# Patient Record
Sex: Female | Born: 1940 | Race: White | Hispanic: No | Smoking: Never smoker
Health system: Southern US, Community
[De-identification: ages and names within clinical notes are randomized; demographics above are authoritative.]

## PROBLEM LIST (undated history)

## (undated) DIAGNOSIS — R945 Abnormal results of liver function studies: Secondary | ICD-10-CM

## (undated) DIAGNOSIS — E782 Mixed hyperlipidemia: Secondary | ICD-10-CM

## (undated) DIAGNOSIS — J441 Chronic obstructive pulmonary disease with (acute) exacerbation: Secondary | ICD-10-CM

## (undated) DIAGNOSIS — Z8719 Personal history of other diseases of the digestive system: Secondary | ICD-10-CM

## (undated) DIAGNOSIS — C4491 Basal cell carcinoma of skin, unspecified: Secondary | ICD-10-CM

## (undated) DIAGNOSIS — C801 Malignant (primary) neoplasm, unspecified: Secondary | ICD-10-CM

## (undated) DIAGNOSIS — R7989 Other specified abnormal findings of blood chemistry: Secondary | ICD-10-CM

## (undated) DIAGNOSIS — IMO0002 Reserved for concepts with insufficient information to code with codable children: Secondary | ICD-10-CM

## (undated) DIAGNOSIS — E1165 Type 2 diabetes mellitus with hyperglycemia: Secondary | ICD-10-CM

## (undated) DIAGNOSIS — I10 Essential (primary) hypertension: Secondary | ICD-10-CM

## (undated) DIAGNOSIS — E11649 Type 2 diabetes mellitus with hypoglycemia without coma: Secondary | ICD-10-CM

## (undated) DIAGNOSIS — D329 Benign neoplasm of meninges, unspecified: Secondary | ICD-10-CM

## (undated) DIAGNOSIS — F32A Depression, unspecified: Secondary | ICD-10-CM

## (undated) DIAGNOSIS — E1140 Type 2 diabetes mellitus with diabetic neuropathy, unspecified: Secondary | ICD-10-CM

## (undated) DIAGNOSIS — E669 Obesity, unspecified: Secondary | ICD-10-CM

## (undated) DIAGNOSIS — T148XXA Other injury of unspecified body region, initial encounter: Secondary | ICD-10-CM

## (undated) DIAGNOSIS — N289 Disorder of kidney and ureter, unspecified: Secondary | ICD-10-CM

## (undated) DIAGNOSIS — Z9889 Other specified postprocedural states: Secondary | ICD-10-CM

## (undated) DIAGNOSIS — S4291XA Fracture of right shoulder girdle, part unspecified, initial encounter for closed fracture: Secondary | ICD-10-CM

## (undated) DIAGNOSIS — G4733 Obstructive sleep apnea (adult) (pediatric): Secondary | ICD-10-CM

## (undated) DIAGNOSIS — E063 Autoimmune thyroiditis: Secondary | ICD-10-CM

## (undated) DIAGNOSIS — F329 Major depressive disorder, single episode, unspecified: Secondary | ICD-10-CM

## (undated) DIAGNOSIS — E1151 Type 2 diabetes mellitus with diabetic peripheral angiopathy without gangrene: Secondary | ICD-10-CM

## (undated) DIAGNOSIS — R42 Dizziness and giddiness: Secondary | ICD-10-CM

## (undated) DIAGNOSIS — R569 Unspecified convulsions: Secondary | ICD-10-CM

## (undated) DIAGNOSIS — E049 Nontoxic goiter, unspecified: Secondary | ICD-10-CM

## (undated) HISTORY — DX: Major depressive disorder, single episode, unspecified: F32.9

## (undated) HISTORY — DX: Autoimmune thyroiditis: E06.3

## (undated) HISTORY — DX: Obstructive sleep apnea (adult) (pediatric): G47.33

## (undated) HISTORY — DX: Mixed hyperlipidemia: E78.2

## (undated) HISTORY — DX: Fracture of right shoulder girdle, part unspecified, initial encounter for closed fracture: S42.91XA

## (undated) HISTORY — DX: Nontoxic goiter, unspecified: E04.9

## (undated) HISTORY — DX: Chronic obstructive pulmonary disease with (acute) exacerbation: J44.1

## (undated) HISTORY — DX: Personal history of other diseases of the digestive system: Z87.19

## (undated) HISTORY — DX: Essential (primary) hypertension: I10

## (undated) HISTORY — DX: Reserved for concepts with insufficient information to code with codable children: IMO0002

## (undated) HISTORY — DX: Other injury of unspecified body region, initial encounter: T14.8XXA

## (undated) HISTORY — DX: Type 2 diabetes mellitus with diabetic peripheral angiopathy without gangrene: E11.51

## (undated) HISTORY — PX: BASAL CELL CARCINOMA EXCISION: SHX1214

## (undated) HISTORY — DX: Basal cell carcinoma of skin, unspecified: C44.91

## (undated) HISTORY — PX: LAPAROSCOPIC GASTRIC BANDING: SHX1100

## (undated) HISTORY — DX: Unspecified convulsions: R56.9

## (undated) HISTORY — PX: BACK SURGERY: SHX140

## (undated) HISTORY — PX: APPENDECTOMY: SHX54

## (undated) HISTORY — DX: Dizziness and giddiness: R42

## (undated) HISTORY — DX: Obesity, unspecified: E66.9

## (undated) HISTORY — DX: Depression, unspecified: F32.A

## (undated) HISTORY — PX: CHOLECYSTECTOMY: SHX55

## (undated) HISTORY — DX: Type 2 diabetes mellitus with hyperglycemia: E11.65

## (undated) HISTORY — DX: Type 2 diabetes mellitus with diabetic neuropathy, unspecified: E11.40

## (undated) HISTORY — DX: Abnormal results of liver function studies: R94.5

## (undated) HISTORY — DX: Other specified abnormal findings of blood chemistry: R79.89

## (undated) HISTORY — DX: Benign neoplasm of meninges, unspecified: D32.9

## (undated) HISTORY — PX: MASTECTOMY: SHX3

## (undated) HISTORY — DX: Disorder of kidney and ureter, unspecified: N28.9

## (undated) HISTORY — DX: Type 2 diabetes mellitus with hypoglycemia without coma: E11.649

---

## 1998-02-04 ENCOUNTER — Ambulatory Visit (HOSPITAL_COMMUNITY): Admission: RE | Admit: 1998-02-04 | Discharge: 1998-02-04 | Payer: Self-pay | Admitting: Family Medicine

## 1998-07-23 ENCOUNTER — Encounter: Payer: Self-pay | Admitting: Neurosurgery

## 1998-07-27 ENCOUNTER — Inpatient Hospital Stay (HOSPITAL_COMMUNITY): Admission: RE | Admit: 1998-07-27 | Discharge: 1998-07-29 | Payer: Self-pay | Admitting: Neurosurgery

## 1998-07-27 ENCOUNTER — Encounter: Payer: Self-pay | Admitting: Neurosurgery

## 1999-02-10 ENCOUNTER — Encounter: Payer: Self-pay | Admitting: Family Medicine

## 1999-02-10 ENCOUNTER — Ambulatory Visit (HOSPITAL_COMMUNITY): Admission: RE | Admit: 1999-02-10 | Discharge: 1999-02-10 | Payer: Self-pay | Admitting: Family Medicine

## 2000-02-13 ENCOUNTER — Ambulatory Visit (HOSPITAL_COMMUNITY): Admission: RE | Admit: 2000-02-13 | Discharge: 2000-02-13 | Payer: Self-pay | Admitting: Family Medicine

## 2000-02-13 ENCOUNTER — Encounter: Payer: Self-pay | Admitting: Family Medicine

## 2001-03-04 ENCOUNTER — Encounter: Payer: Self-pay | Admitting: Family Medicine

## 2001-03-04 ENCOUNTER — Ambulatory Visit (HOSPITAL_COMMUNITY): Admission: RE | Admit: 2001-03-04 | Discharge: 2001-03-04 | Payer: Self-pay | Admitting: Family Medicine

## 2001-04-08 ENCOUNTER — Encounter: Payer: Self-pay | Admitting: Orthopedic Surgery

## 2001-04-08 ENCOUNTER — Encounter: Admission: RE | Admit: 2001-04-08 | Discharge: 2001-04-08 | Payer: Self-pay | Admitting: Orthopedic Surgery

## 2003-06-28 ENCOUNTER — Emergency Department (HOSPITAL_COMMUNITY): Admission: EM | Admit: 2003-06-28 | Discharge: 2003-06-28 | Payer: Self-pay | Admitting: Emergency Medicine

## 2003-07-02 ENCOUNTER — Encounter: Admission: RE | Admit: 2003-07-02 | Discharge: 2003-07-02 | Payer: Self-pay | Admitting: Neurosurgery

## 2003-07-17 ENCOUNTER — Encounter: Admission: RE | Admit: 2003-07-17 | Discharge: 2003-07-17 | Payer: Self-pay | Admitting: Neurosurgery

## 2003-07-28 ENCOUNTER — Encounter: Admission: RE | Admit: 2003-07-28 | Discharge: 2003-07-28 | Payer: Self-pay | Admitting: Neurosurgery

## 2003-11-18 ENCOUNTER — Inpatient Hospital Stay (HOSPITAL_COMMUNITY): Admission: RE | Admit: 2003-11-18 | Discharge: 2003-11-20 | Payer: Self-pay | Admitting: Neurosurgery

## 2005-08-31 ENCOUNTER — Ambulatory Visit: Payer: Self-pay | Admitting: "Endocrinology

## 2005-10-02 ENCOUNTER — Ambulatory Visit: Payer: Self-pay | Admitting: "Endocrinology

## 2005-12-14 ENCOUNTER — Encounter: Admission: RE | Admit: 2005-12-14 | Discharge: 2006-03-14 | Payer: Self-pay | Admitting: "Endocrinology

## 2006-01-31 ENCOUNTER — Encounter: Admission: RE | Admit: 2006-01-31 | Discharge: 2006-05-01 | Payer: Self-pay | Admitting: "Endocrinology

## 2006-02-15 ENCOUNTER — Ambulatory Visit: Payer: Self-pay | Admitting: "Endocrinology

## 2006-04-11 ENCOUNTER — Ambulatory Visit: Payer: Self-pay | Admitting: "Endocrinology

## 2006-07-11 ENCOUNTER — Ambulatory Visit: Payer: Self-pay | Admitting: "Endocrinology

## 2006-08-07 ENCOUNTER — Inpatient Hospital Stay (HOSPITAL_COMMUNITY): Admission: RE | Admit: 2006-08-07 | Discharge: 2006-08-08 | Payer: Self-pay | Admitting: Neurosurgery

## 2006-10-25 ENCOUNTER — Ambulatory Visit: Payer: Self-pay | Admitting: "Endocrinology

## 2007-01-22 ENCOUNTER — Ambulatory Visit: Payer: Self-pay | Admitting: "Endocrinology

## 2007-05-13 ENCOUNTER — Ambulatory Visit: Payer: Self-pay | Admitting: "Endocrinology

## 2007-08-27 ENCOUNTER — Ambulatory Visit: Payer: Self-pay | Admitting: "Endocrinology

## 2008-02-21 ENCOUNTER — Ambulatory Visit (HOSPITAL_COMMUNITY): Admission: RE | Admit: 2008-02-21 | Discharge: 2008-02-21 | Payer: Self-pay | Admitting: Surgery

## 2008-03-18 ENCOUNTER — Encounter: Admission: RE | Admit: 2008-03-18 | Discharge: 2008-03-18 | Payer: Self-pay | Admitting: Surgery

## 2008-04-07 ENCOUNTER — Ambulatory Visit: Payer: Self-pay | Admitting: "Endocrinology

## 2008-05-11 ENCOUNTER — Ambulatory Visit (HOSPITAL_COMMUNITY): Admission: RE | Admit: 2008-05-11 | Discharge: 2008-05-11 | Payer: Self-pay | Admitting: Surgery

## 2008-07-16 ENCOUNTER — Ambulatory Visit: Payer: Self-pay | Admitting: "Endocrinology

## 2008-11-05 ENCOUNTER — Encounter: Admission: RE | Admit: 2008-11-05 | Discharge: 2009-02-03 | Payer: Self-pay | Admitting: Surgery

## 2008-11-23 ENCOUNTER — Ambulatory Visit (HOSPITAL_COMMUNITY): Admission: RE | Admit: 2008-11-23 | Discharge: 2008-11-24 | Payer: Self-pay | Admitting: Surgery

## 2008-12-01 ENCOUNTER — Inpatient Hospital Stay (HOSPITAL_COMMUNITY): Admission: EM | Admit: 2008-12-01 | Discharge: 2008-12-03 | Payer: Self-pay | Admitting: Emergency Medicine

## 2008-12-05 ENCOUNTER — Inpatient Hospital Stay (HOSPITAL_COMMUNITY): Admission: EM | Admit: 2008-12-05 | Discharge: 2008-12-08 | Payer: Self-pay | Admitting: Emergency Medicine

## 2008-12-15 ENCOUNTER — Ambulatory Visit: Payer: Self-pay | Admitting: "Endocrinology

## 2009-01-12 ENCOUNTER — Ambulatory Visit: Payer: Self-pay | Admitting: "Endocrinology

## 2009-02-16 ENCOUNTER — Encounter: Admission: RE | Admit: 2009-02-16 | Discharge: 2009-04-07 | Payer: Self-pay | Admitting: Surgery

## 2009-03-11 ENCOUNTER — Encounter: Admission: RE | Admit: 2009-03-11 | Discharge: 2009-03-11 | Payer: Self-pay | Admitting: Surgery

## 2009-03-16 ENCOUNTER — Ambulatory Visit (HOSPITAL_COMMUNITY): Admission: RE | Admit: 2009-03-16 | Discharge: 2009-03-16 | Payer: Self-pay | Admitting: Surgery

## 2009-03-17 ENCOUNTER — Encounter: Admission: RE | Admit: 2009-03-17 | Discharge: 2009-03-17 | Payer: Self-pay | Admitting: Surgery

## 2009-03-30 ENCOUNTER — Encounter: Payer: Self-pay | Admitting: Surgery

## 2009-03-30 ENCOUNTER — Inpatient Hospital Stay (HOSPITAL_COMMUNITY): Admission: RE | Admit: 2009-03-30 | Discharge: 2009-04-01 | Payer: Self-pay | Admitting: Surgery

## 2009-03-30 ENCOUNTER — Encounter (INDEPENDENT_AMBULATORY_CARE_PROVIDER_SITE_OTHER): Payer: Self-pay | Admitting: Surgery

## 2009-04-22 ENCOUNTER — Inpatient Hospital Stay (HOSPITAL_COMMUNITY): Admission: EM | Admit: 2009-04-22 | Discharge: 2009-04-28 | Payer: Self-pay | Admitting: Emergency Medicine

## 2009-05-02 ENCOUNTER — Inpatient Hospital Stay (HOSPITAL_COMMUNITY): Admission: EM | Admit: 2009-05-02 | Discharge: 2009-05-17 | Payer: Self-pay | Admitting: Emergency Medicine

## 2009-06-23 ENCOUNTER — Ambulatory Visit: Payer: Self-pay | Admitting: Oncology

## 2009-07-26 ENCOUNTER — Ambulatory Visit: Payer: Self-pay | Admitting: "Endocrinology

## 2009-08-24 ENCOUNTER — Ambulatory Visit: Payer: Self-pay | Admitting: Vascular Surgery

## 2010-02-21 ENCOUNTER — Ambulatory Visit: Payer: Self-pay | Admitting: "Endocrinology

## 2010-05-01 ENCOUNTER — Encounter: Payer: Self-pay | Admitting: Surgery

## 2010-05-25 ENCOUNTER — Ambulatory Visit (INDEPENDENT_AMBULATORY_CARE_PROVIDER_SITE_OTHER): Payer: Medicare Other | Admitting: "Endocrinology

## 2010-05-25 DIAGNOSIS — I10 Essential (primary) hypertension: Secondary | ICD-10-CM

## 2010-05-25 DIAGNOSIS — E1065 Type 1 diabetes mellitus with hyperglycemia: Secondary | ICD-10-CM

## 2010-05-25 DIAGNOSIS — E038 Other specified hypothyroidism: Secondary | ICD-10-CM

## 2010-05-25 DIAGNOSIS — IMO0002 Reserved for concepts with insufficient information to code with codable children: Secondary | ICD-10-CM

## 2010-06-26 LAB — GLUCOSE, CAPILLARY
Glucose-Capillary: 106 mg/dL — ABNORMAL HIGH (ref 70–99)
Glucose-Capillary: 158 mg/dL — ABNORMAL HIGH (ref 70–99)
Glucose-Capillary: 162 mg/dL — ABNORMAL HIGH (ref 70–99)
Glucose-Capillary: 165 mg/dL — ABNORMAL HIGH (ref 70–99)
Glucose-Capillary: 200 mg/dL — ABNORMAL HIGH (ref 70–99)
Glucose-Capillary: 315 mg/dL — ABNORMAL HIGH (ref 70–99)
Glucose-Capillary: 360 mg/dL — ABNORMAL HIGH (ref 70–99)
Glucose-Capillary: 101 mg/dL — ABNORMAL HIGH (ref 70–99)
Glucose-Capillary: 105 mg/dL — ABNORMAL HIGH (ref 70–99)
Glucose-Capillary: 107 mg/dL — ABNORMAL HIGH (ref 70–99)
Glucose-Capillary: 110 mg/dL — ABNORMAL HIGH (ref 70–99)
Glucose-Capillary: 116 mg/dL — ABNORMAL HIGH (ref 70–99)
Glucose-Capillary: 118 mg/dL — ABNORMAL HIGH (ref 70–99)
Glucose-Capillary: 119 mg/dL — ABNORMAL HIGH (ref 70–99)
Glucose-Capillary: 123 mg/dL — ABNORMAL HIGH (ref 70–99)
Glucose-Capillary: 144 mg/dL — ABNORMAL HIGH (ref 70–99)
Glucose-Capillary: 146 mg/dL — ABNORMAL HIGH (ref 70–99)
Glucose-Capillary: 148 mg/dL — ABNORMAL HIGH (ref 70–99)
Glucose-Capillary: 150 mg/dL — ABNORMAL HIGH (ref 70–99)
Glucose-Capillary: 154 mg/dL — ABNORMAL HIGH (ref 70–99)
Glucose-Capillary: 156 mg/dL — ABNORMAL HIGH (ref 70–99)
Glucose-Capillary: 179 mg/dL — ABNORMAL HIGH (ref 70–99)
Glucose-Capillary: 194 mg/dL — ABNORMAL HIGH (ref 70–99)
Glucose-Capillary: 207 mg/dL — ABNORMAL HIGH (ref 70–99)
Glucose-Capillary: 209 mg/dL — ABNORMAL HIGH (ref 70–99)
Glucose-Capillary: 210 mg/dL — ABNORMAL HIGH (ref 70–99)
Glucose-Capillary: 211 mg/dL — ABNORMAL HIGH (ref 70–99)
Glucose-Capillary: 214 mg/dL — ABNORMAL HIGH (ref 70–99)
Glucose-Capillary: 215 mg/dL — ABNORMAL HIGH (ref 70–99)
Glucose-Capillary: 228 mg/dL — ABNORMAL HIGH (ref 70–99)
Glucose-Capillary: 229 mg/dL — ABNORMAL HIGH (ref 70–99)
Glucose-Capillary: 233 mg/dL — ABNORMAL HIGH (ref 70–99)
Glucose-Capillary: 238 mg/dL — ABNORMAL HIGH (ref 70–99)
Glucose-Capillary: 239 mg/dL — ABNORMAL HIGH (ref 70–99)
Glucose-Capillary: 273 mg/dL — ABNORMAL HIGH (ref 70–99)
Glucose-Capillary: 290 mg/dL — ABNORMAL HIGH (ref 70–99)
Glucose-Capillary: 309 mg/dL — ABNORMAL HIGH (ref 70–99)
Glucose-Capillary: 323 mg/dL — ABNORMAL HIGH (ref 70–99)
Glucose-Capillary: 75 mg/dL (ref 70–99)
Glucose-Capillary: 76 mg/dL (ref 70–99)
Glucose-Capillary: 79 mg/dL (ref 70–99)
Glucose-Capillary: 84 mg/dL (ref 70–99)
Glucose-Capillary: 89 mg/dL (ref 70–99)

## 2010-06-26 LAB — BLOOD GAS, ARTERIAL
Acid-Base Excess: 2.2 mmol/L — ABNORMAL HIGH (ref 0.0–2.0)
Drawn by: 235321
Drawn by: 235321
FIO2: 0.21 %
O2 Content: 2 L/min
pCO2 arterial: 17.5 mmHg — CL (ref 35.0–45.0)
pCO2 arterial: 41.7 mmHg (ref 35.0–45.0)
pH, Arterial: 7.684 (ref 7.350–7.400)
pO2, Arterial: 139 mmHg — ABNORMAL HIGH (ref 80.0–100.0)
pO2, Arterial: 62 mmHg — ABNORMAL LOW (ref 80.0–100.0)

## 2010-06-26 LAB — CBC
HCT: 27.7 % — ABNORMAL LOW (ref 36.0–46.0)
Hemoglobin: 12.3 g/dL (ref 12.0–15.0)
Hemoglobin: 9.4 g/dL — ABNORMAL LOW (ref 12.0–15.0)
Hemoglobin: 10.2 g/dL — ABNORMAL LOW (ref 12.0–15.0)
Hemoglobin: 10.2 g/dL — ABNORMAL LOW (ref 12.0–15.0)
MCHC: 33.8 g/dL (ref 30.0–36.0)
MCHC: 33.8 g/dL (ref 30.0–36.0)
MCHC: 33.9 g/dL (ref 30.0–36.0)
MCV: 88.9 fL (ref 78.0–100.0)
MCV: 89.5 fL (ref 78.0–100.0)
Platelets: 231 10*3/uL (ref 150–400)
Platelets: 205 10*3/uL (ref 150–400)
Platelets: 227 10*3/uL (ref 150–400)
Platelets: 242 10*3/uL (ref 150–400)
Platelets: 283 10*3/uL (ref 150–400)
RBC: 3.09 MIL/uL — ABNORMAL LOW (ref 3.87–5.11)
RBC: 4.03 MIL/uL (ref 3.87–5.11)
RBC: 3.21 MIL/uL — ABNORMAL LOW (ref 3.87–5.11)
RBC: 3.37 MIL/uL — ABNORMAL LOW (ref 3.87–5.11)
RBC: 3.4 MIL/uL — ABNORMAL LOW (ref 3.87–5.11)
RDW: 13.8 % (ref 11.5–15.5)
RDW: 13.8 % (ref 11.5–15.5)
RDW: 14.3 % (ref 11.5–15.5)
RDW: 14.6 % (ref 11.5–15.5)
RDW: 14.9 % (ref 11.5–15.5)
WBC: 9.6 10*3/uL (ref 4.0–10.5)
WBC: 10.1 10*3/uL (ref 4.0–10.5)
WBC: 6.2 10*3/uL (ref 4.0–10.5)
WBC: 6.4 10*3/uL (ref 4.0–10.5)
WBC: 7.6 10*3/uL (ref 4.0–10.5)

## 2010-06-26 LAB — BASIC METABOLIC PANEL
BUN: 22 mg/dL (ref 6–23)
BUN: 21 mg/dL (ref 6–23)
CO2: 21 mEq/L (ref 19–32)
CO2: 24 mEq/L (ref 19–32)
CO2: 24 mEq/L (ref 19–32)
CO2: 32 mEq/L (ref 19–32)
Calcium: 8.6 mg/dL (ref 8.4–10.5)
Calcium: 9 mg/dL (ref 8.4–10.5)
Calcium: 8 mg/dL — ABNORMAL LOW (ref 8.4–10.5)
Calcium: 8.2 mg/dL — ABNORMAL LOW (ref 8.4–10.5)
Chloride: 106 mEq/L (ref 96–112)
Chloride: 107 mEq/L (ref 96–112)
Chloride: 100 mEq/L (ref 96–112)
Chloride: 106 mEq/L (ref 96–112)
Chloride: 106 mEq/L (ref 96–112)
Chloride: 99 mEq/L (ref 96–112)
Creatinine, Ser: 0.88 mg/dL (ref 0.4–1.2)
Creatinine, Ser: 0.72 mg/dL (ref 0.4–1.2)
Creatinine, Ser: 0.74 mg/dL (ref 0.4–1.2)
Creatinine, Ser: 0.81 mg/dL (ref 0.4–1.2)
Creatinine, Ser: 0.9 mg/dL (ref 0.4–1.2)
Creatinine, Ser: 1.01 mg/dL (ref 0.4–1.2)
GFR calc Af Amer: >60 mL/min (ref >60)
GFR calc Af Amer: >60 mL/min (ref >60)
GFR calc Af Amer: >60 mL/min (ref >60)
GFR calc Af Amer: >60 mL/min (ref >60)
GFR calc Af Amer: >60 mL/min (ref >60)
GFR calc Af Amer: >60 mL/min (ref >60)
GFR calc non Af Amer: >60 mL/min (ref >60)
Glucose, Bld: 264 mg/dL — ABNORMAL HIGH (ref 70–99)
Glucose, Bld: 123 mg/dL — ABNORMAL HIGH (ref 70–99)
Potassium: 3.7 mEq/L (ref 3.5–5.1)
Potassium: 4 mEq/L (ref 3.5–5.1)
Sodium: 135 mEq/L (ref 135–145)
Sodium: 135 mEq/L (ref 135–145)
Sodium: 136 mEq/L (ref 135–145)
Sodium: 136 mEq/L (ref 135–145)

## 2010-06-26 LAB — CULTURE, BLOOD (ROUTINE X 2)

## 2010-06-26 LAB — URINALYSIS, ROUTINE W REFLEX MICROSCOPIC
Hgb urine dipstick: NEGATIVE
Ketones, ur: NEGATIVE mg/dL
Nitrite: NEGATIVE
Nitrite: NEGATIVE
Protein, ur: NEGATIVE mg/dL
Specific Gravity, Urine: 1.026 (ref 1.005–1.030)
Urobilinogen, UA: 0.2 mg/dL (ref 0.0–1.0)
Urobilinogen, UA: 0.2 mg/dL (ref 0.0–1.0)
pH: 7 (ref 5.0–8.0)

## 2010-06-26 LAB — DIFFERENTIAL
Basophils Absolute: 0.1 10*3/uL (ref 0.0–0.1)
Basophils Relative: 1 % (ref 0–1)
Basophils Relative: 0 % (ref 0–1)
Basophils Relative: 1 % (ref 0–1)
Eosinophils Absolute: 0 10*3/uL (ref 0.0–0.7)
Eosinophils Absolute: 0.1 10*3/uL (ref 0.0–0.7)
Eosinophils Absolute: 0.5 10*3/uL (ref 0.0–0.7)
Eosinophils Relative: 1 % (ref 0–5)
Lymphocytes Relative: 12 % (ref 12–46)
Lymphs Abs: 1.1 10*3/uL (ref 0.7–4.0)
Monocytes Absolute: 0.4 10*3/uL (ref 0.1–1.0)
Monocytes Absolute: 0.8 10*3/uL (ref 0.1–1.0)
Monocytes Relative: 5 % (ref 3–12)
Monocytes Relative: 4 % (ref 3–12)
Monocytes Relative: 7 % (ref 3–12)
Neutro Abs: 8.1 10*3/uL — ABNORMAL HIGH (ref 1.7–7.7)
Neutrophils Relative %: 78 % — ABNORMAL HIGH (ref 43–77)
Neutrophils Relative %: 84 % — ABNORMAL HIGH (ref 43–77)

## 2010-06-26 LAB — BLOOD GAS, VENOUS
Acid-Base Excess: 4.3 mmol/L — ABNORMAL HIGH (ref 0.0–2.0)
O2 Content: 2 L/min
Patient temperature: 98.6
pCO2, Ven: 21.4 mmHg — ABNORMAL LOW (ref 45.0–50.0)

## 2010-06-26 LAB — COMPREHENSIVE METABOLIC PANEL
ALT: 27 U/L (ref 0–35)
ALT: 25 U/L (ref 0–35)
ALT: 30 U/L (ref 0–35)
ALT: 38 U/L — ABNORMAL HIGH (ref 0–35)
AST: 31 U/L (ref 0–37)
AST: 40 U/L — ABNORMAL HIGH (ref 0–37)
AST: 75 U/L — ABNORMAL HIGH (ref 0–37)
Albumin: 2 g/dL — ABNORMAL LOW (ref 3.5–5.2)
Albumin: 2.1 g/dL — ABNORMAL LOW (ref 3.5–5.2)
Albumin: 2.6 g/dL — ABNORMAL LOW (ref 3.5–5.2)
Alkaline Phosphatase: 95 U/L (ref 39–117)
Alkaline Phosphatase: 115 U/L (ref 39–117)
Alkaline Phosphatase: 152 U/L — ABNORMAL HIGH (ref 39–117)
Alkaline Phosphatase: 92 U/L (ref 39–117)
BUN: 21 mg/dL (ref 6–23)
BUN: 8 mg/dL (ref 6–23)
CO2: 18 mEq/L — ABNORMAL LOW (ref 19–32)
CO2: 31 mEq/L (ref 19–32)
Calcium: 8.2 mg/dL — ABNORMAL LOW (ref 8.4–10.5)
Chloride: 103 mEq/L (ref 96–112)
Chloride: 107 mEq/L (ref 96–112)
Creatinine, Ser: 0.71 mg/dL (ref 0.4–1.2)
GFR calc Af Amer: >60 mL/min (ref >60)
GFR calc Af Amer: >60 mL/min (ref >60)
GFR calc Af Amer: >60 mL/min (ref >60)
GFR calc non Af Amer: 50 mL/min — ABNORMAL LOW (ref >60)
GFR calc non Af Amer: >60 mL/min (ref >60)
Glucose, Bld: 397 mg/dL — ABNORMAL HIGH (ref 70–99)
Potassium: 4.1 mEq/L (ref 3.5–5.1)
Potassium: 3.4 mEq/L — ABNORMAL LOW (ref 3.5–5.1)
Potassium: 3.5 mEq/L (ref 3.5–5.1)
Potassium: 3.7 mEq/L (ref 3.5–5.1)
Sodium: 136 mEq/L (ref 135–145)
Sodium: 132 mEq/L — ABNORMAL LOW (ref 135–145)
Sodium: 136 mEq/L (ref 135–145)
Sodium: 137 mEq/L (ref 135–145)
Total Bilirubin: 0.9 mg/dL (ref 0.3–1.2)
Total Bilirubin: 0.6 mg/dL (ref 0.3–1.2)
Total Protein: 5.3 g/dL — ABNORMAL LOW (ref 6.0–8.3)
Total Protein: 5.3 g/dL — ABNORMAL LOW (ref 6.0–8.3)
Total Protein: 6.9 g/dL (ref 6.0–8.3)

## 2010-06-26 LAB — PHOSPHORUS
Phosphorus: 3.3 mg/dL (ref 2.3–4.6)
Phosphorus: 3.4 mg/dL (ref 2.3–4.6)

## 2010-06-26 LAB — CHOLESTEROL, TOTAL: Cholesterol: 132 mg/dL (ref 0–200)

## 2010-06-26 LAB — URINE MICROSCOPIC-ADD ON

## 2010-06-26 LAB — POCT CARDIAC MARKERS

## 2010-06-26 LAB — MAGNESIUM: Magnesium: 1.7 mg/dL (ref 1.5–2.5)

## 2010-06-26 LAB — URINE CULTURE: Culture: NO GROWTH

## 2010-06-26 LAB — HEMOGLOBIN A1C
Hgb A1c MFr Bld: 8.3 % — ABNORMAL HIGH (ref 4.6–6.1)
Mean Plasma Glucose: 192 mg/dL

## 2010-06-26 LAB — PREALBUMIN: Prealbumin: 8.5 mg/dL — ABNORMAL LOW (ref 18.0–45.0)

## 2010-06-26 LAB — VANCOMYCIN, TROUGH: Vancomycin Tr: 30.8 ug/mL (ref 10.0–20.0)

## 2010-06-26 LAB — LACTIC ACID, PLASMA
Lactic Acid, Venous: 1.7 mmol/L (ref 0.5–2.2)
Lactic Acid, Venous: 4.9 mmol/L — ABNORMAL HIGH (ref 0.5–2.2)
Lactic Acid, Venous: 3.4 mmol/L — ABNORMAL HIGH (ref 0.5–2.2)

## 2010-06-26 LAB — WOUND CULTURE

## 2010-06-26 LAB — TSH: TSH: 0.021 u[IU]/mL — ABNORMAL LOW (ref 0.350–4.500)

## 2010-06-29 LAB — GLUCOSE, CAPILLARY
Glucose-Capillary: 115 mg/dL — ABNORMAL HIGH (ref 70–99)
Glucose-Capillary: 121 mg/dL — ABNORMAL HIGH (ref 70–99)
Glucose-Capillary: 122 mg/dL — ABNORMAL HIGH (ref 70–99)
Glucose-Capillary: 127 mg/dL — ABNORMAL HIGH (ref 70–99)
Glucose-Capillary: 137 mg/dL — ABNORMAL HIGH (ref 70–99)
Glucose-Capillary: 143 mg/dL — ABNORMAL HIGH (ref 70–99)
Glucose-Capillary: 149 mg/dL — ABNORMAL HIGH (ref 70–99)
Glucose-Capillary: 158 mg/dL — ABNORMAL HIGH (ref 70–99)
Glucose-Capillary: 170 mg/dL — ABNORMAL HIGH (ref 70–99)
Glucose-Capillary: 179 mg/dL — ABNORMAL HIGH (ref 70–99)
Glucose-Capillary: 189 mg/dL — ABNORMAL HIGH (ref 70–99)
Glucose-Capillary: 214 mg/dL — ABNORMAL HIGH (ref 70–99)
Glucose-Capillary: 223 mg/dL — ABNORMAL HIGH (ref 70–99)
Glucose-Capillary: 234 mg/dL — ABNORMAL HIGH (ref 70–99)
Glucose-Capillary: 245 mg/dL — ABNORMAL HIGH (ref 70–99)
Glucose-Capillary: 96 mg/dL (ref 70–99)

## 2010-06-29 LAB — BASIC METABOLIC PANEL
CO2: 32 mEq/L (ref 19–32)
Calcium: 8.1 mg/dL — ABNORMAL LOW (ref 8.4–10.5)
Chloride: 100 mEq/L (ref 96–112)
GFR calc Af Amer: >60 mL/min (ref >60)
GFR calc Af Amer: >60 mL/min (ref >60)
GFR calc non Af Amer: >60 mL/min (ref >60)
Glucose, Bld: 171 mg/dL — ABNORMAL HIGH (ref 70–99)
Potassium: 3.8 mEq/L (ref 3.5–5.1)
Potassium: 3.9 mEq/L (ref 3.5–5.1)
Sodium: 134 mEq/L — ABNORMAL LOW (ref 135–145)
Sodium: 138 mEq/L (ref 135–145)

## 2010-06-29 LAB — MAGNESIUM: Magnesium: 1.6 mg/dL (ref 1.5–2.5)

## 2010-07-11 LAB — GLUCOSE, CAPILLARY
Glucose-Capillary: 106 mg/dL — ABNORMAL HIGH (ref 70–99)
Glucose-Capillary: 129 mg/dL — ABNORMAL HIGH (ref 70–99)
Glucose-Capillary: 133 mg/dL — ABNORMAL HIGH (ref 70–99)
Glucose-Capillary: 156 mg/dL — ABNORMAL HIGH (ref 70–99)
Glucose-Capillary: 193 mg/dL — ABNORMAL HIGH (ref 70–99)
Glucose-Capillary: 247 mg/dL — ABNORMAL HIGH (ref 70–99)
Glucose-Capillary: 96 mg/dL (ref 70–99)

## 2010-07-11 LAB — BASIC METABOLIC PANEL
BUN: 25 mg/dL — ABNORMAL HIGH (ref 6–23)
CO2: 28 mEq/L (ref 19–32)
Chloride: 105 mEq/L (ref 96–112)
Potassium: 4.5 mEq/L (ref 3.5–5.1)

## 2010-07-11 LAB — CBC
HCT: 34.3 % — ABNORMAL LOW (ref 36.0–46.0)
Hemoglobin: 10.3 g/dL — ABNORMAL LOW (ref 12.0–15.0)
MCHC: 34.8 g/dL (ref 30.0–36.0)
MCHC: 35 g/dL (ref 30.0–36.0)
MCV: 89.8 fL (ref 78.0–100.0)
MCV: 89.8 fL (ref 78.0–100.0)
Platelets: 215 10*3/uL (ref 150–400)
RBC: 3.3 MIL/uL — ABNORMAL LOW (ref 3.87–5.11)
RBC: 3.81 MIL/uL — ABNORMAL LOW (ref 3.87–5.11)
WBC: 8.4 10*3/uL (ref 4.0–10.5)

## 2010-07-12 LAB — BASIC METABOLIC PANEL
CO2: 26 mEq/L (ref 19–32)
Calcium: 8.8 mg/dL (ref 8.4–10.5)
Creatinine, Ser: 1.11 mg/dL (ref 0.4–1.2)
GFR calc Af Amer: 59 mL/min — ABNORMAL LOW (ref >60)

## 2010-07-16 LAB — COMPREHENSIVE METABOLIC PANEL
ALT: 79 U/L — ABNORMAL HIGH (ref 0–35)
ALT: 52 U/L — ABNORMAL HIGH (ref 0–35)
AST: 58 U/L — ABNORMAL HIGH (ref 0–37)
Albumin: 3.5 g/dL (ref 3.5–5.2)
Alkaline Phosphatase: 71 U/L (ref 39–117)
CO2: 25 mEq/L (ref 19–32)
CO2: 27 mEq/L (ref 19–32)
Calcium: 8 mg/dL — ABNORMAL LOW (ref 8.4–10.5)
Calcium: 8.9 mg/dL (ref 8.4–10.5)
Calcium: 8.9 mg/dL (ref 8.4–10.5)
Calcium: 9.4 mg/dL (ref 8.4–10.5)
Creatinine, Ser: 1.1 mg/dL (ref 0.4–1.2)
Creatinine, Ser: 1.16 mg/dL (ref 0.4–1.2)
Creatinine, Ser: 1.32 mg/dL — ABNORMAL HIGH (ref 0.4–1.2)
GFR calc Af Amer: 47 mL/min — ABNORMAL LOW (ref >60)
GFR calc non Af Amer: 40 mL/min — ABNORMAL LOW (ref >60)
GFR calc non Af Amer: 47 mL/min — ABNORMAL LOW (ref >60)
GFR calc non Af Amer: 50 mL/min — ABNORMAL LOW (ref >60)
Glucose, Bld: 127 mg/dL — ABNORMAL HIGH (ref 70–99)
Glucose, Bld: 180 mg/dL — ABNORMAL HIGH (ref 70–99)
Glucose, Bld: 247 mg/dL — ABNORMAL HIGH (ref 70–99)
Potassium: 4.5 mEq/L (ref 3.5–5.1)
Sodium: 135 mEq/L (ref 135–145)
Sodium: 135 mEq/L (ref 135–145)
Total Protein: 6.1 g/dL (ref 6.0–8.3)
Total Protein: 7.1 g/dL (ref 6.0–8.3)

## 2010-07-16 LAB — DIFFERENTIAL
Basophils Absolute: 0 10*3/uL (ref 0.0–0.1)
Basophils Absolute: 0.1 10*3/uL (ref 0.0–0.1)
Basophils Absolute: 0.2 10*3/uL — ABNORMAL HIGH (ref 0.0–0.1)
Basophils Relative: 0 % (ref 0–1)
Basophils Relative: 0 % (ref 0–1)
Basophils Relative: 1 % (ref 0–1)
Basophils Relative: 1 % (ref 0–1)
Eosinophils Absolute: 0 10*3/uL (ref 0.0–0.7)
Eosinophils Absolute: 0.1 10*3/uL (ref 0.0–0.7)
Eosinophils Absolute: 0.1 10*3/uL (ref 0.0–0.7)
Eosinophils Absolute: 0.6 10*3/uL (ref 0.0–0.7)
Eosinophils Relative: 0 % (ref 0–5)
Eosinophils Relative: 1 % (ref 0–5)
Eosinophils Relative: 4 % (ref 0–5)
Eosinophils Relative: 8 % — ABNORMAL HIGH (ref 0–5)
Lymphocytes Relative: 11 % — ABNORMAL LOW (ref 12–46)
Lymphocytes Relative: 12 % (ref 12–46)
Lymphocytes Relative: 18 % (ref 12–46)
Lymphs Abs: 1.4 10*3/uL (ref 0.7–4.0)
Lymphs Abs: 2.1 10*3/uL (ref 0.7–4.0)
Lymphs Abs: 2.5 10*3/uL (ref 0.7–4.0)
Monocytes Absolute: 0.1 10*3/uL (ref 0.1–1.0)
Monocytes Absolute: 0.6 10*3/uL (ref 0.1–1.0)
Monocytes Absolute: 0.5 10*3/uL (ref 0.1–1.0)
Monocytes Relative: 5 % (ref 3–12)
Monocytes Relative: 7 % (ref 3–12)
Neutro Abs: 8.4 10*3/uL — ABNORMAL HIGH (ref 1.7–7.7)
Neutrophils Relative %: 82 % — ABNORMAL HIGH (ref 43–77)

## 2010-07-16 LAB — CBC
HCT: 35.7 % — ABNORMAL LOW (ref 36.0–46.0)
HCT: 36.1 % (ref 36.0–46.0)
HCT: 37.8 % (ref 36.0–46.0)
HCT: 43.1 % (ref 36.0–46.0)
Hemoglobin: 10.7 g/dL — ABNORMAL LOW (ref 12.0–15.0)
Hemoglobin: 12 g/dL (ref 12.0–15.0)
Hemoglobin: 12.4 g/dL (ref 12.0–15.0)
Hemoglobin: 12.7 g/dL (ref 12.0–15.0)
Hemoglobin: 14.6 g/dL (ref 12.0–15.0)
Hemoglobin: 12 g/dL (ref 12.0–15.0)
MCHC: 33.6 g/dL (ref 30.0–36.0)
MCHC: 33.6 g/dL (ref 30.0–36.0)
MCHC: 33.7 g/dL (ref 30.0–36.0)
MCHC: 33.8 g/dL (ref 30.0–36.0)
MCHC: 34.3 g/dL (ref 30.0–36.0)
MCHC: 34.5 g/dL (ref 30.0–36.0)
MCHC: 33.5 g/dL (ref 30.0–36.0)
MCV: 90.2 fL (ref 78.0–100.0)
MCV: 90.3 fL (ref 78.0–100.0)
MCV: 90.6 fL (ref 78.0–100.0)
MCV: 90.9 fL (ref 78.0–100.0)
MCV: 91.3 fL (ref 78.0–100.0)
Platelets: 180 10*3/uL (ref 150–400)
Platelets: 226 10*3/uL (ref 150–400)
Platelets: 288 10*3/uL (ref 150–400)
RBC: 4.2 MIL/uL (ref 3.87–5.11)
RBC: 4.76 MIL/uL (ref 3.87–5.11)
RDW: 14.3 % (ref 11.5–15.5)
RDW: 14.5 % (ref 11.5–15.5)
RDW: 14.5 % (ref 11.5–15.5)
RDW: 14.7 % (ref 11.5–15.5)
RDW: 14.6 % (ref 11.5–15.5)
WBC: 13.2 10*3/uL — ABNORMAL HIGH (ref 4.0–10.5)

## 2010-07-16 LAB — HEMOGLOBIN A1C
Hgb A1c MFr Bld: 7.3 % — ABNORMAL HIGH (ref 4.6–6.1)
Hgb A1c MFr Bld: 7.7 % — ABNORMAL HIGH (ref 4.6–6.1)
Hgb A1c MFr Bld: 7.8 % — ABNORMAL HIGH (ref 4.6–6.1)
Mean Plasma Glucose: 163 mg/dL

## 2010-07-16 LAB — GLUCOSE, CAPILLARY
Glucose-Capillary: 100 mg/dL — ABNORMAL HIGH (ref 70–99)
Glucose-Capillary: 103 mg/dL — ABNORMAL HIGH (ref 70–99)
Glucose-Capillary: 109 mg/dL — ABNORMAL HIGH (ref 70–99)
Glucose-Capillary: 117 mg/dL — ABNORMAL HIGH (ref 70–99)
Glucose-Capillary: 117 mg/dL — ABNORMAL HIGH (ref 70–99)
Glucose-Capillary: 119 mg/dL — ABNORMAL HIGH (ref 70–99)
Glucose-Capillary: 129 mg/dL — ABNORMAL HIGH (ref 70–99)
Glucose-Capillary: 131 mg/dL — ABNORMAL HIGH (ref 70–99)
Glucose-Capillary: 137 mg/dL — ABNORMAL HIGH (ref 70–99)
Glucose-Capillary: 153 mg/dL — ABNORMAL HIGH (ref 70–99)
Glucose-Capillary: 157 mg/dL — ABNORMAL HIGH (ref 70–99)
Glucose-Capillary: 167 mg/dL — ABNORMAL HIGH (ref 70–99)
Glucose-Capillary: 171 mg/dL — ABNORMAL HIGH (ref 70–99)
Glucose-Capillary: 174 mg/dL — ABNORMAL HIGH (ref 70–99)
Glucose-Capillary: 190 mg/dL — ABNORMAL HIGH (ref 70–99)
Glucose-Capillary: 203 mg/dL — ABNORMAL HIGH (ref 70–99)
Glucose-Capillary: 234 mg/dL — ABNORMAL HIGH (ref 70–99)
Glucose-Capillary: 253 mg/dL — ABNORMAL HIGH (ref 70–99)
Glucose-Capillary: 76 mg/dL (ref 70–99)
Glucose-Capillary: 99 mg/dL (ref 70–99)

## 2010-07-16 LAB — URINE MICROSCOPIC-ADD ON

## 2010-07-16 LAB — LIPASE, BLOOD
Lipase: 19 U/L (ref 11–59)
Lipase: 24 U/L (ref 11–59)

## 2010-07-16 LAB — URINALYSIS, ROUTINE W REFLEX MICROSCOPIC
Bilirubin Urine: NEGATIVE
Specific Gravity, Urine: 1.02 (ref 1.005–1.030)
Urobilinogen, UA: 0.2 mg/dL (ref 0.0–1.0)
pH: 5.5 (ref 5.0–8.0)

## 2010-07-16 LAB — BASIC METABOLIC PANEL
BUN: 20 mg/dL (ref 6–23)
CO2: 23 mEq/L (ref 19–32)
CO2: 27 mEq/L (ref 19–32)
Calcium: 7.9 mg/dL — ABNORMAL LOW (ref 8.4–10.5)
Chloride: 99 mEq/L (ref 96–112)
GFR calc non Af Amer: 52 mL/min — ABNORMAL LOW (ref >60)
GFR calc non Af Amer: 56 mL/min — ABNORMAL LOW (ref >60)
Glucose, Bld: 114 mg/dL — ABNORMAL HIGH (ref 70–99)
Glucose, Bld: 198 mg/dL — ABNORMAL HIGH (ref 70–99)
Potassium: 3.8 mEq/L (ref 3.5–5.1)
Sodium: 135 mEq/L (ref 135–145)

## 2010-07-16 LAB — HEMOGLOBIN AND HEMATOCRIT, BLOOD: Hemoglobin: 11 g/dL — ABNORMAL LOW (ref 12.0–15.0)

## 2010-08-01 ENCOUNTER — Encounter: Payer: Self-pay | Admitting: *Deleted

## 2010-08-01 ENCOUNTER — Other Ambulatory Visit: Payer: Self-pay | Admitting: *Deleted

## 2010-08-01 DIAGNOSIS — E782 Mixed hyperlipidemia: Secondary | ICD-10-CM | POA: Insufficient documentation

## 2010-08-01 DIAGNOSIS — E038 Other specified hypothyroidism: Secondary | ICD-10-CM

## 2010-08-01 DIAGNOSIS — I10 Essential (primary) hypertension: Secondary | ICD-10-CM

## 2010-08-01 DIAGNOSIS — E669 Obesity, unspecified: Secondary | ICD-10-CM

## 2010-08-23 NOTE — Procedures (Signed)
CAROTID DUPLEX EXAM   INDICATION:  Carotid bruit.   HISTORY:  Diabetes:  Yes.  Cardiac:  Hypertension:  Yes.  Smoking:  No.  Previous Surgery:  No carotid surgery.  CV History:  Asymptomatic.  Amaurosis Fugax No, Paresthesias No, Hemiparesis No.                                       RIGHT             LEFT  Brachial systolic pressure:                           Mastectomy  Brachial Doppler waveforms:         WNL               WNL  Vertebral direction of flow:        Antegrade         Antegrade  DUPLEX VELOCITIES (cm/sec)  CCA peak systolic                   84                87  ECA peak systolic                   80                85  ICA peak systolic                   73                92  ICA end diastolic                   20                25  PLAQUE MORPHOLOGY:                                    Calcific  PLAQUE AMOUNT:                      None              Mild  PLAQUE LOCATION:  Bifurcation/proximal ICA   IMPRESSION:  1. Right internal carotid artery appears within normal limits.  2. Left internal carotid artery shows evidence of 1% to 39% stenosis.   ___________________________________________  Nelda Severe Kellie Simmering, M.D.   AS/MEDQ  D:  08/25/2009  T:  08/25/2009  Job:  [id number]

## 2010-08-23 NOTE — Op Note (Signed)
Morgan [name], Morgan [name]               ACCOUNT NO.:  [id number]   MEDICAL RECORD NO.:  [id number]          PATIENT TYPE:  AMB   LOCATION:  DAY                          FACILITY:  [facility]   PHYSICIAN:  Isabel Caprice. Hassell Done, MD  DATE OF BIRTH:  1940-07-23   DATE OF PROCEDURE:  11/23/2008  DATE OF DISCHARGE:                               OPERATIVE REPORT   PREOPERATIVE DIAGNOSIS:  Morbid obesity, BMI 44 with multiple  comorbidities including significant diabetes.   PROCEDURE:  Laparoscopic adjustable gastric banding (Allergan APS  system), repair of hiatal hernia posteriorly with two sutures, removal  of lipoma from the mediastinum and adhesiolysis.   SURGEON:  Johnathan Hausen, M.D.   ASSISTANT:  Adonis Housekeeper, M.D.   ANESTHESIA:  General endotracheal.   DESCRIPTION OF PROCEDURE:  This 70 year old lady was taken to the  operating room on 08/16 and given general anesthesia.  The abdomen was  prepped with Techni-Care equivalent and she was draped sterilely.  Abdomen was entered through the left upper quadrant using OptiVu  technique without difficulty despite the fact she had a previous robotic  left nephrectomy and then she had a large paramedian incision on the  right side.  I did feel with those and took those adhesions down  sharply.  Ultimately a 5 mm was placed in the upper midline for the  [facility] retractor and a 15 in the right upper quadrant, the 11 more  toward the midline and then another in the lower portion.  I also  prepped and draped out the skin biopsy site from the left lower quadrant  that had a little scab on it.   Once I did the enterolysis to free everything up, I went up and  dissected the foregut.  This included sizing the gastrohepatic window,  going back posteriorly incising along the crura and dissecting the right  crura away from the esophagus and getting down under ultimately seeing a  left crus.  Above, I could see what appeared to be a lipoma encasing the  esophagus.  I was able to grasp that and then pulled it into the abdomen  and then I resected it.  This really freed up the esophagus and it was  amazing how it then looked.  The balloon test was positive for the  obvious hiatal hernia and so I went ahead and repaired it with two  stitches posteriorly, held in place with Ti-KNOTs.  I then did a balloon  test again with 10 mL of air and it was held in the abdomen.   The AP of the band passer went around appropriately and an APS band was  introduced and brought around and buckled on the stomach.  It was  plicated with three sutures getting good purchases of stomach and I  pulled the fat  pad sort of to the patient's right.  Secure placement of the band was  then ensured.  It looked good and was brought out through the right  lower port and secured to the port and then in a subcutaneous fashion.  The band port had Marlex mesh sutured  to the back.  Wounds were injected  and were closed 4-0 Vicryl, Benzoin, Steri-Strips.      Isabel Caprice Hassell Done, MD  Electronically Signed     MBM/MEDQ  D:  11/23/2008  T:  11/23/2008  Job:  [id number]   cc:   Gilford Rile, MD  Fax: [phone]   Sherrlyn Hock, M.D.  Fax: [phone]   Elizabeth Sauer, M.D.  Fax: [phone]

## 2010-08-23 NOTE — Op Note (Signed)
NAMEBRITTNE, Morgan [name]               ACCOUNT NO.:  [id number]   MEDICAL RECORD NO.:  [id number]          PATIENT TYPE:  AMB   LOCATION:  DAY                          FACILITY:  [facility]   PHYSICIAN:  Isabel Caprice. Hassell Done, MD  DATE OF BIRTH:  05/07/1940   DATE OF PROCEDURE:  11/23/2008  DATE OF DISCHARGE:                               OPERATIVE REPORT   PREOPERATIVE DIAGNOSES:  Morbid obesity with multiple comorbidities, BMI  44, medical conditions include morbid obesity, gastroesophageal reflux  disease, arthritis, bursitis, chronic obstructive pulmonary disease,  depression, diabetes, hypertension, hypercholesterolemia,  hypothyroidism, diabetic neuropathy, sleep apnea and severe lower  degenerative joint disease.   PROCEDURE:  Laparoscopic adjustable gastric band (Allergan APS system)  and the closure of the hiatus and repair of the hiatal hernia  posteriorly with two sutures Surgidac, removal of large lipoma from the  mediastinum.  An adhesiolysis from her previous paramedian incision, her  previous robotic nephrectomy.   SURGEON:  Isabel Caprice. Hassell Done, MD   ASSISTANT:  Darene Lamer. Hoxworth, M.D.   ANESTHESIA:  General endotracheal.   DESCRIPTION OF PROCEDURE:  Morgan [name] was taken to room 1 on Monday,  November 23, 2008, given general anesthesia.  The abdomen was prepped with  Techni-Care equivalent and draped sterilely.  The abdomen was entered  the left upper quadrant using OptiVu 0 degrees.  The abdomen was  insufflated and I had to go in taking down multiple adhesions from the  paramedian and above.  Once this was done, I was able to put in standard  trocar placement, putting a 15 in the right upper quadrant, 11 below and  Nathanson and a 5 up above the liver.  Liver was retracted and then I  did a foregut dissection.  I went along the lesser curve and along the  gastrohepatic membrane, took that down and then dissected the right crus  from the esophagus.  I mobilized her esophagus  and found a fat pad above  and when I dissected away, there was a large lipoma that was filling up  a lot of this hiatal hernia.  We will did a balloon test which was  positive with 10 mL of air and then once we had this large fat mass out,  went ahead and resected it with the electrocautery hook.  The posterior  repair was done with an Endo stitch simple sutures through the left and  right crura and then secured with Ti-Knots.   The band passer was then brought in from below this down along the fatty  cleavage on the right crus and coming out on the left crus.  APS was  inserted through the band port and brought around.  It was engaged in  the buckle, snapped down over the guide tubing which was then removed.   Plication was performed with three sutures of Surgidac, free sutures  with good purchases down on the now very skinny proximal foregut and the  fat had been removed or pulled over to the right.  After this plication  was done, the tubing was brought out through the right  lower quadrant  port and engaged into the port which had Marlex mesh  sewn to the back.  This was tucked into a subcutaneous pocket and the  entire area was closed with 4-0 Vicryl, Benzoin and Steri-Strips.  The  patient tolerated the procedure well and was taken to recovery room in  satisfactory condition.      Isabel Caprice Hassell Done, MD  Electronically Signed     MBM/MEDQ  D:  11/23/2008  T:  11/23/2008  Job:  [id number]   cc:   Gilford Rile, MD  Fax: [phone]   Sherrlyn Hock, M.D.  Fax: [phone]   Elizabeth Sauer, M.D.  Fax: [phone]

## 2010-08-23 NOTE — H&P (Signed)
NAMEFRENCHIE, PRIBYL               ACCOUNT NO.:  [id number]   MEDICAL RECORD NO.:  [id number]          PATIENT TYPE:  EMS   LOCATION:  ED                           FACILITY:  [facility]   PHYSICIAN:  Isabel Caprice. Hassell Done, MD  DATE OF BIRTH:  03/18/1941   DATE OF ADMISSION:  12/01/2008  DATE OF DISCHARGE:                              HISTORY & PHYSICAL   ADMISSION DIAGNOSIS:  As nausea, vomiting post band.   HISTORY:  Morgan [name] is a 70 year old white female who returns to the  emergency room this morning with about 1-week out from a laparoscopic  adjustable gastric banding with hiatal hernia repair.  In addition, Morgan [name], who has had a previous left robotic nephrectomy and other  surgery underwent a adhesiolysis during that procedure.  Everything went  well and the foregut was closed with 2 sutures posteriorly and plication  was done over a APS band.  Her underlying medical problems include  diabetes, hypertension, high cholesterol, hyperthyroid and some  neuropathy.   HISTORY OF THIS PRESENT ILLNESS:  She had on Sunday noted some reflux  symptoms and then yesterday began having some nausea and vomiting.  She  called the office and eventually came to the emergency room this  morning.  I was in the operating room and I was called by Dr. Alvino Chapel,  came to see her and reviewed  and I am going to go ahead and admit her  for observation, for IV fluids and will probably get a CT scan to look  at her foregut.   PAST MEDICAL HISTORY:  1. Hypertension.  2. Diabetes.  3. High cholesterol.  4. Hypothyroidism.  5. Peripheral neuropathy.   PREVIOUS SURGERY:  1. Appendectomy.  2. Back surgery.  3. Cervical disk surgery.  4. Cholecystectomy.  5. Hemorrhoidectomy.  6. Lap band as mentioned.  7. Rotator cuff surgery.   SOCIAL HISTORY:  She is married.  She is a nonsmoker, nondrinker.  No  drug abuse.   ALLERGIES:  Allergy to PENICILLIN.   REVIEW OF SYSTEMS:  Is noncontributory to  this.   PHYSICAL EXAM:  GENERAL:  Obese white female.  VITAL SIGNS:  Blood pressure 210/76 and pulse rate 79, respirations 22,  afebrile.  HEAD:  Normocephalic.  Eyes: Sclerae nonicteric.  Pupils equal and  reactive light.  Nose and throat exam unremarkable.  NECK:  Supple.  CHEST: Clear.  HEART: There is a sinus rhythm without murmurs or gallops.  ABDOMEN: Nondistended.  Incisions are healing okay and she does not have  any rebound.  She is more sore in the left upper quadrant.  EXTREMITIES:  Upper full range of motion.  NEURO:  Alert and oriented x3.  Motor and sensory function grossly  intact.  SKIN:  Normal.  No rashes, petechiae.  PSYCH:  Appropriate affect for her discomfort.   LABORATORY:  Includes a C-met, which showed her glucose elevated 180,  BUN is 43, creatinine 1.1, SGOT and SGPT are slightly elevated at 38 and  58 respectively.  The remaining portion of those were unremarkable.  Lipase is normal.  Hemoglobin  13, white count 13,200 with 84%  neutrophils.   IMPRESSION:  Nausea and vomiting in a post lap band patient 1 week out.   PLAN:  Admit for observation.  I advise rehydration.  We will get  CT  scan to look for evidence of band position, rule out leak or  obstruction, particularly with her degree of adhesions that were taken  down.      Isabel Caprice Hassell Done, MD  Electronically Signed     MBM/MEDQ  D:  12/01/2008  T:  12/01/2008  Job:  [id number]

## 2010-08-26 ENCOUNTER — Other Ambulatory Visit: Payer: Self-pay | Admitting: Internal Medicine

## 2010-08-26 DIAGNOSIS — M545 Low back pain: Secondary | ICD-10-CM

## 2010-08-26 NOTE — H&P (Signed)
Morgan [name], Morgan [name]               ACCOUNT NO.:  [id number]   MEDICAL RECORD NO.:  [id number]          PATIENT TYPE:  INP   LOCATION:  2899                         FACILITY:  [facility]   PHYSICIAN:  Elizabeth Sauer, M.D.      DATE OF BIRTH:  07/07/1940   DATE OF ADMISSION:  08/07/2006  DATE OF DISCHARGE:                              HISTORY & PHYSICAL   ADMISSION DIAGNOSIS:  Foraminal disk on the left side at L3-4.   HISTORY OF PRESENT ILLNESS:  This is a now 70 year old right-handed  white lady who for about 6 weeks now has had pain in her right hip down  her right leg.  She had an MR that shows a foraminal disk on the right  side at L3-4 and she is now admitted for diskectomy.   PAST MEDICAL HISTORY:  Remarkable for diabetes.  She is on Novolin 96  units at night and a sliding-scale.  She also takes Rezulin, Prempro,  Prozac, Lotrel, Captopril, Darvocet, Synthroid.   ALLERGIES:  She is allergic to PENICILLIN.   SURGICAL HISTORY:  Anterior decompression and fusions at C5-6 as well as  a 3-4 diskectomy in the canal several years ago.  She has also had a  cholecystectomy, appendectomy, tubal ligation, tendon transplant.   FAMILY HISTORY:  Mom died at 30 of cancer.  Dad died at 20 of heart  disease, stroke, complications related to diabetes.   SOCIAL HISTORY:  Does not smoke and does not drink and is retired.   REVIEW OF SYSTEMS:  Marked for glasses, hypertension,  hypercholesterolemia, arm weakness, arm pain, joint pain, neck pain, leg  pain.   IMPRESSION:  Diabetes and thyroid disease.   PHYSICAL EXAMINATION:  HEENT exam normal limits.  NECK: She has good range of motion of her neck.  CHEST:  Chest clear.  CARDIAC EXAM:  Regular rate and rhythm.  ABDOMEN:  Abdomen was very large, nontender with no hepatosplenomegaly.  EXTREMITIES:  Without clubbing or cyanosis.  Peripheral pulses are good.  NEUROLOGIC:  She is awake, alert and oriented.  Cranial nerves intact.  Motor exam shows  5/5 strength throughout the upper and lower  extremities.  She has positive straight leg.  A lot of sciatic pain in  the right hip.  Reflexes are absent in the lower extremities.  MR shows  large foraminal disk at 3-4, off to the left side.   CLINICAL IMPRESSION:  Lumbar radiculopathy related to foraminal disk.   PLAN:  The plan is for foraminal diskectomy, two level extra foraminal  diskectomy.  The risks and benefits of this approach have been discussed  with her and she wishes proceed.   .           ______________________________  Elizabeth Sauer, M.D.     MWR/MEDQ  D:  08/07/2006  T:  08/07/2006  Job:  [id number]

## 2010-08-26 NOTE — Op Note (Signed)
NAMELUCIELLE, Morgan [name]               ACCOUNT NO.:  [id number]   MEDICAL RECORD NO.:  [id number]          PATIENT TYPE:  INP   LOCATION:  2899                         FACILITY:  [facility]   PHYSICIAN:  Elizabeth Sauer, M.D.      DATE OF BIRTH:  1940/10/11   DATE OF PROCEDURE:  08/07/2006  DATE OF DISCHARGE:                               OPERATIVE REPORT   PREOPERATIVE DIAGNOSIS:  Foraminal disk at L3-4 on the right.   POSTOPERATIVE DIAGNOSIS:  Foraminal disk at L3-4 on the right.   OPERATIVE PROCEDURE:  L3-4 foraminal diskectomy.   SURGEON:  Elizabeth Sauer, M.D.   ANESTHESIA:  General endotracheal.   PREP:  Sterile Betadine prep and scrub with alcohol wipe.   COMPLICATIONS:  None.   NURSE ASSISTANT:  Covington.   DOCTOR ASSISTANT:  Dr. Sherwood Gambler.   BODY OF TEXT:  This is a 70 year old lady with the right L3  radiculopathy.  Taken to operating room smoothly anesthetized and  intubated, placed prone on the operating table.  Following shave, prep,  drape in the usual sterile fashion, old L3 incision was reopened and the  lamina of L3 dissected free out over the L3-4, L2-3 facets.  The L3 and  L4 transverse processes were identified and intraoperative x-ray  confirmed correctness of level.  The McCullough self-retaining retractor  was placed.  Having confirmed correctness of level, dissection to the  lateral most portion of the pars and the superior lateral portion of the  inferior articular process of L3 was carried out allowing access to the  neural foramen.  The L3 dorsal root ganglion was identified it being  pushed laterally by foraminal disk fragment.  This disk fragment was  removed without difficulty.  The neural foramen carefully explored and  found to be open.  The nerve root was carefully explored along its  length and found to be free.  The wound was irrigated.  Hemostasis  assured.  Depo-Medrol soaked fat was placed in the lateral laminotomy  defect.  Successive layers of 0-0  Vicryl, 2-0 Vicryl and 3-0 nylon were  used to close.  Betadine Telfa dressing was applied, made occlusive with  OpSite.  The patient returned recovery room in good condition.           ______________________________  Elizabeth Sauer, M.D.     MWR/MEDQ  D:  08/07/2006  T:  08/07/2006  Job:  [id number]

## 2010-08-26 NOTE — Op Note (Signed)
Morgan [name], Morgan [name]                         ACCOUNT NO.:  [id number]   MEDICAL RECORD NO.:  [id number]                   PATIENT TYPE:  INP   LOCATION:  3023                                 FACILITY:  [facility]   PHYSICIAN:  Elizabeth Sauer, M.D.                   DATE OF BIRTH:  03/01/1941   DATE OF PROCEDURE:  11/18/2003  DATE OF DISCHARGE:                                 OPERATIVE REPORT   PREOPERATIVE DIAGNOSIS:  Herniated disk on the left side at L1-2, on the  right at L3-4.   POSTOPERATIVE DIAGNOSIS:  Herniated disk on the left side at L1-2, on the  right at L3-4.   OPERATION PERFORMED:  L1-2 left diskectomy, L3-4 right diskectomy.   SURGEON:  Elizabeth Sauer, M.D.   ANESTHESIA:  General endotracheal.   PREP:  Sterile Betadine prep and scrub with alcohol wipe.   COMPLICATIONS:  None.   NURSE ASSISTANT:  Covington.   DOCTOR ASSISTANT:  Hosie Spangle, M.D.   INDICATIONS FOR PROCEDURE:  The patient is a 70 year old lady with severe  spondylosis and herniated disk with back pain at 1-2 on the left and 3-4 on  the right.   DESCRIPTION OF PROCEDURE:  The patient was taken to the operating room,  smoothly anesthetized, intubated, and placed prone on the operating table.  Following shave, prep and drape in the usual sterile fashion, the skin was  infiltrated with 1% lidocaine, 1:400,000 epinephrine.  Marker was placed and  x-ray obtained to try and get the incision situated in the correct place.  Skin was infiltrated with 1% lidocaine, 1:400,000 epinephrine.  Initial  incision was made in the top from mid-T12 to mid-L2.  The lamina of T12 and  L1 were exposed on the left side in the subperiosteal plane.  Intraoperative  x-ray showed the marker to be under T12.  Hemisemilaminectomy of L1 was  carried out on the left side to the top of the ligamentum flavum.  It was  removed in a retrograde fashion.  This allowed access to the lateral portion  of the L1 disk that was gently  retracted medially.  There was a herniated  disk immediately underneath it.  The few annular fibers were divided bluntly  and diskectomy carried out without difficulty.  It resulted in immediate  decompression of the L1 root from medial to lateral.  After the disk had  been removed and disk space carefully explored, the wound was irrigated and  hemostasis assured.  It was packed with DepoMedrol soaked fat.  The second  incision was made directly over the lamina of 3.  It was dissected free in  the subperiosteal plane on the right side.  Intraoperative x-ray confirmed  correctness of this level.  Hemisemilaminectomy of L3 was carried out to the  top of the ligamentum flavum.  This was removed in retrograde fashion.  Access to the  lateral aspect of the right L4 root was thus obtained and  gentle dissection, medial retraction of the nerve root.  The disk space was  exposed and diskectomy carried out without difficulty.  The nerve root was  explored and all quadrants found to be free.  The disk at L3-4 was more  diffusely bulging than the disk at L1-2 but did not have the volume  compressing the nerve roots.  Both wounds were irrigated and hemostasis  assured.  The laminectomy defects were filled with Depo-Medrol soaked fat.  The fascia was reapproximated with 0 Vicryl in interrupted fashion. The  subcutaneous tissue was reapproximated with 0 Vicryl in interrupted fashion.  The subcuticular tissues were reapproximated with 3-0 Vicryl in interrupted  fashion.  The skin was closed with 3-0 nylon in a running locked fashion.  Betadine Telfa dressing was applied and made occlusive with Op-Site.  The  patient was then transferred to the recovery room in good condition.                                               Elizabeth Sauer, M.D.    MWR/MEDQ  D:  11/18/2003  T:  11/18/2003  Job:  [id number]

## 2010-08-26 NOTE — H&P (Signed)
Morgan [name], Morgan [name]                         ACCOUNT NO.:  [id number]   MEDICAL RECORD NO.:  [id number]                   PATIENT TYPE:  INP   LOCATION:  NA                                   FACILITY:  [facility]   PHYSICIAN:  Elizabeth Sauer, M.D.                   DATE OF BIRTH:  05/24/1940   DATE OF ADMISSION:  11/18/2003  DATE OF DISCHARGE:                                HISTORY & PHYSICAL   ADMISSION DIAGNOSIS:  Herniated disk on the left side at L1-2 and on the  right at L3-4.   BODY OF TEXT:  This is a 70 year old right-handed white lady who in March  began having pain in her back and down both legs from the hips. MRI showed a  central disk at L1-2. Epidural steroids were attempted which did not seem to  help much. She returned to the office in July. She had back pain going down  her legs. MRI demonstrated a disk at L1-2 and L3-4, and she is now admitted  for lumbar laminectomy and diskectomy.   PAST MEDICAL HISTORY:  Remarkable for diabetes. She is on Novolin 70/30 with  30 in the morning and 40 at night, Rezulin, Prempro, Prozac, Lipitor,  captopril, Darvocet, and Synthroid.   ALLERGIES:  PENICILLIN.   SOCIAL HISTORY:  Remarkable for cholecystectomy, appendectomy in the 1970s.  Tubal ligation in 1975. Tendon transplant in 1994. Anterior cervical  decompression and fusion at C5-6 in 2000.   FAMILY HISTORY:  Mother died at 25 of cancer. Father died at 36 of heart  disease, stroke, and complications related to diabetes.   SOCIAL HISTORY:  She does not smoke or drink. 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   REVIEW OF SYSTEMS:  Remarkable for glasses, hypertension,  hypercholesterolemia, arm weakness, arm pain, joint pain, and neck pain. She  suffers from depression as well as diabetes and thyroid disease.   PHYSICAL EXAMINATION:  HEENT: Within normal limits. She has limited range of  motion of her neck, but a well healed incision.  CHEST: Clear.  CARDIAC: Regular rate and rhythm.  ABDOMEN: Large, but nontender with no hepatosplenomegaly  EXTREMITIES: Without clubbing or cyanosis. Peripheral pulses good.  GU: Deferred.  NEUROLOGIC: She is awake, alert, and oriented. Her cranial nerves are intact  except for some slightly diminished hearing. Motor exam reveals 5/5 strength  throughout the upper and lower extremities.  Sensory deficit described in a  stocking distribution. Reflexes are absent at the knees, absent at the  ankles. Toes are downgoing bilaterally. Straight leg raise is positive for  mid and low back pain.   MR results have reviewed.   CLINICAL IMPRESSION:  Herniated disk at L1-2 and L3-4. The plan is for a  left-sided laminectomy at 1-2 and right-sided laminectomy at 3-4.  The risks  and benefits of this approach have been discussed  with her and she wishes to  proceed.                                                Elizabeth Sauer, M.D.    MWR/MEDQ  D:  11/18/2003  T:  11/18/2003  Job:  [id number]

## 2010-08-29 ENCOUNTER — Ambulatory Visit (INDEPENDENT_AMBULATORY_CARE_PROVIDER_SITE_OTHER): Payer: Medicare Other | Admitting: "Endocrinology

## 2010-08-29 VITALS — BP 155/73 | HR 99 | Wt 247.8 lb

## 2010-08-29 DIAGNOSIS — E063 Autoimmune thyroiditis: Secondary | ICD-10-CM

## 2010-08-29 DIAGNOSIS — E038 Other specified hypothyroidism: Secondary | ICD-10-CM

## 2010-08-29 DIAGNOSIS — E11649 Type 2 diabetes mellitus with hypoglycemia without coma: Secondary | ICD-10-CM

## 2010-08-29 DIAGNOSIS — E1169 Type 2 diabetes mellitus with other specified complication: Secondary | ICD-10-CM

## 2010-08-29 DIAGNOSIS — I10 Essential (primary) hypertension: Secondary | ICD-10-CM

## 2010-08-29 DIAGNOSIS — E1165 Type 2 diabetes mellitus with hyperglycemia: Secondary | ICD-10-CM

## 2010-08-29 LAB — POCT GLYCOSYLATED HEMOGLOBIN (HGB A1C): Hemoglobin A1C: 8.9

## 2010-08-29 LAB — COMPREHENSIVE METABOLIC PANEL
AST: 60 U/L — ABNORMAL HIGH (ref 0–37)
Albumin: 3.8 g/dL (ref 3.5–5.2)
BUN: 34 mg/dL — ABNORMAL HIGH (ref 6–23)
CO2: 26 mEq/L (ref 19–32)
Calcium: 9.3 mg/dL (ref 8.4–10.5)
Chloride: 98 mEq/L (ref 96–112)
Potassium: 5 mEq/L (ref 3.5–5.3)

## 2010-08-29 LAB — T4, FREE: Free T4: 1.26 ng/dL (ref 0.80–1.80)

## 2010-08-29 NOTE — Patient Instructions (Signed)
Please cal in two weeks to discuss BG results

## 2010-09-01 ENCOUNTER — Ambulatory Visit
Admission: RE | Admit: 2010-09-01 | Discharge: 2010-09-01 | Disposition: A | Discharge status: Home / Self Care | Payer: Medicare Other | Source: Ambulatory Visit | Attending: Internal Medicine | Admitting: Internal Medicine

## 2010-09-01 DIAGNOSIS — M545 Low back pain: Secondary | ICD-10-CM

## 2010-09-15 ENCOUNTER — Encounter: Payer: Self-pay | Admitting: *Deleted

## 2010-09-26 ENCOUNTER — Encounter: Payer: Self-pay | Admitting: "Endocrinology

## 2010-09-26 DIAGNOSIS — E11649 Type 2 diabetes mellitus with hypoglycemia without coma: Secondary | ICD-10-CM | POA: Insufficient documentation

## 2010-09-26 DIAGNOSIS — E782 Mixed hyperlipidemia: Secondary | ICD-10-CM | POA: Insufficient documentation

## 2010-09-26 DIAGNOSIS — E049 Nontoxic goiter, unspecified: Secondary | ICD-10-CM | POA: Insufficient documentation

## 2010-09-26 DIAGNOSIS — R231 Pallor: Secondary | ICD-10-CM | POA: Insufficient documentation

## 2010-09-26 DIAGNOSIS — E1151 Type 2 diabetes mellitus with diabetic peripheral angiopathy without gangrene: Secondary | ICD-10-CM | POA: Insufficient documentation

## 2010-09-26 DIAGNOSIS — R42 Dizziness and giddiness: Secondary | ICD-10-CM | POA: Insufficient documentation

## 2010-09-26 DIAGNOSIS — R945 Abnormal results of liver function studies: Secondary | ICD-10-CM | POA: Insufficient documentation

## 2010-09-26 DIAGNOSIS — R600 Localized edema: Secondary | ICD-10-CM | POA: Insufficient documentation

## 2010-09-26 DIAGNOSIS — J449 Chronic obstructive pulmonary disease, unspecified: Secondary | ICD-10-CM | POA: Insufficient documentation

## 2010-09-26 DIAGNOSIS — R5383 Other fatigue: Secondary | ICD-10-CM | POA: Insufficient documentation

## 2010-09-26 DIAGNOSIS — F329 Major depressive disorder, single episode, unspecified: Secondary | ICD-10-CM | POA: Insufficient documentation

## 2010-09-26 DIAGNOSIS — E063 Autoimmune thyroiditis: Secondary | ICD-10-CM | POA: Insufficient documentation

## 2010-09-26 DIAGNOSIS — Z8719 Personal history of other diseases of the digestive system: Secondary | ICD-10-CM | POA: Insufficient documentation

## 2010-09-26 DIAGNOSIS — G4733 Obstructive sleep apnea (adult) (pediatric): Secondary | ICD-10-CM | POA: Insufficient documentation

## 2010-09-26 DIAGNOSIS — E1142 Type 2 diabetes mellitus with diabetic polyneuropathy: Secondary | ICD-10-CM | POA: Insufficient documentation

## 2010-09-26 DIAGNOSIS — M109 Gout, unspecified: Secondary | ICD-10-CM | POA: Insufficient documentation

## 2010-09-26 DIAGNOSIS — E1165 Type 2 diabetes mellitus with hyperglycemia: Secondary | ICD-10-CM | POA: Insufficient documentation

## 2010-09-26 NOTE — Progress Notes (Signed)
CC: FU of T2DM, hypoglycemia, peripheral neuropathy, peripheral angiopathy, hyperlipidemia, hypertension, hypothyroid, thyroiditis, goiter, fatigue, edema, obesity, elevated transaminases, GERD, depression, gout  HPI: 70 y.o. Caucasian woman 1. I first met Ms. Jurado in early 2006 when I was working for 6 months as a visiting endocrinologist to replace Dr. Genelle Bal at the [facility] in [place], [place]. At that time I did a new patient consultation on Ms. Bland Span, concentrating on her T2DM, hypoglycemia, obesity, hyperlipidemia, hypothyroidism, and hypertension. At that time I did the usual one-page hand-written consult report that Dr. London Pepper had been using for years, sending one copy to the PCP and retaining one copy in the clinic record. Although a coy of that consult and follow-up visits were supposed to be sent to me in [place], I never received them. I saw Ms. Mccutchen one or two more times while I was down in [place], but lost contact with her after my full-time return to [place] about July 2006.  2. In May of 2007 Ms. Whittinghill drove up to see me in [place] for a follow-up visit. She was taking big, fixed doses of Novulin "N" and regular "R" insulins twice daily at breakfast and supper. She was spending most of her time caring for her grandchildren and her invalid husband. Her height was 66 inches and her weight was 292.7 pounds. Her BP was 134/73. Her HbA1c was 7.6%. Her severe back pain and her moderate-to-severe depression were major barriers to care. She would often go a week or longer without checking her blood sugars. She also often missed her insulin doses.She had a 30 gram goiter, 2+ leg edema, and mild-to-moderate peripheral neuropathy in her feet. A dilated eye exam in April of 2006 had demonstrated "early diabetic eye disease". 3. After that visit in May 2007, Ms. Stegeman really did a good job of trying to take care of her DM. In fact, by November her HbA1c  had dropped to 6.5%.  Since then, however, she has had a fairly rocky course. In 2008 I took her off her N and R insulins and converted her to Lantus as a basal insulin and Novolog aspart as a rapid-acting insulin at mealtimes and at HS if needed. Although we taught her how to count carbs and how to use a correction dose and a food dose table to determine her rapid-acting insulin doses,her performance was quit variable. On good days she followed the plan and her BGs were better. But when she stopped checking BGs or taking Novolog reliably at meals, she did poorly. As a result, her HbA1c values have ranged from a low of 7.3% in September 2010 to a high of 10.4% in November 2011, to her most recent value today of 8.9%.  4. Ms. Belgrave has been taking Synthroid for many years for her hypothyroidism, secondary to Hashimoto's thyroiditis. Her current dose is 200 mcg per day. Although that dose had been good for some time, she became hyperthyroid on that dose and was changed to 150 mcg per day in early 2011 and had very good results in April 2011. Apparently her TFTs for her PCP, Dr. Bea Graff, were not good in November 2011, because he increased her dose back to 200 mcg per day then. Unfortunately, she was mildly hyperthyroid again in February 2012. She will need repeat TFTs drawn today. 5. Her last [facility] visit was on 02.1.12. Later in February she was hospitalized with elevated BPs of 220/110. She was also reportedly diagnosed with renal failure. She  has been taking lisinopril/HCTZ at some times, but has also been taking lisinopril and lasix at other times. Because she doesn't ever take notes and doesn't "have the best memory in the world",  She has trouble remembering what's happened to her or what meds she is supposed to be taking. During this time she has also suffered a new compression fracture of T7. She is due to have an MRI of her back soon. She states that she is taking 46 units of Lantus each HS and is also  pretty reliably taking Novolog at meals. 4. PROS: Constitutional: The patient feels very tired and fatigued. Her back hurts a lot and really limits her movements. Eyes: She is overdue for her annual eye exam. Her vision remains fairly good. There are no significant eye complaints. Neck: The patient has no complaints of anterior neck swelling, soreness, tenderness,  pressure, discomfort, or difficulty swallowing.  Heart: The patient has no complaints of palpitations, irregular heat beats, chest pain, or chest pressure. Gastrointestinal: Bowel movents seem normal now. She has no complaints of excessive hunger, acid reflux, upset stomach, stomach aches or pains, diarrhea, or constipation. Legs: Muscle mass and strength seemdecreased. She has fairly persistent edema. Feet: There are no obvious physical foot problems. She has frequent tingling of her feet.  BG meter download: She did not bring her meter with her. She can't really remember what her BGs have been like.  [place]: 1. Still cares for her grandchildren.  ROS: Ms. Thier is not bothered by problems involving her other eleven body systems.  PHYSICAL EXM: BP 155/73  Pulse 99  Wt 247 lb 12.8 oz (112.401 kg)  She has lost 4 pounds since last visit. Constitutional: The patient looks overweight and tired. Surprisingly, however, her spirits are good today.  Eyes: There is no arcus or proptosis.  Mouth: The oropharynx appears normal. The tongue appears normal. There is normal oral moisture. There is no obvious gingivitis. Neck: There are no bruits present. The thyroid gland appears normal in size. The thyroid gland is approximately 20 grams in size. The consistency of the thyroid gland is normal. There is no thyroid tenderness to palpation. Lungs: The lungs are clear. Air movement is good. Heart: The heart rhythm and rate appear normal. Heart sounds S1 and S2 are normal. I do not appreciate any pathologic heart murmurs. Abdomen: The abdominal  size is enlarged. Bowel sounds are normal. The abdomen is soft. Shehad some mild tenderness to palpation of the LUQ. There is no obviously palpable hepatomegaly, splenomegaly, or other masses.  Arms: Muscle mass appears appropriate for age. Hands: There is no obvious tremor. Phalangeal and metacarpophalangeal joints appear normal. Palms are normal. Legs: Muscle mass appears somewhat decreased for age. There is no edema today.  Feet: There are no significant deformities. Dorsalis pedis pulses are trace on the right foot and 1+ on the left foot.  Neurologic: Muscle strength is normal for age and gender  in both the upper and the lower extremities. Muscle tone appears normal. Sensation to touch is normal in the legs, but decreased mildly in the heels, especially the left heel.  Labs: 02.15.12  ASSESSMENT:  1. T2DM: Patient is not doing as well as she could. Given al of her multiple medical problems and the heaps of meds that she takes. She is probably doing as well as we can expect. Since she did not bring her meter in today, I have nothing to go on re adjusting her meds. I've asked her  to call me in two weeks so that she can give me the BG meter results and I can then adjust her insulin plan. 2. Hypothyroid/Hyperthyroid: I'll order TFTs today. 3. Thyroiditis: Clinically quiescent 4. Neuropathy, peripheral: Actually doing better that I would have expected. The better her BGs are, the fewer neurologic symptoms she will have. 5. Angiopathy: Doing well overall.  6. Hypoglycemia: None recently, or at least none that she has recorded or can remember. 7. Edema: She has no edema today, the first time in weeks.   PLAN: 1. TFTs and CMP 2. Patient will call me in two weeks and i can then adjust her insulin plan. 3. FU appointment in three months.

## 2010-09-29 ENCOUNTER — Other Ambulatory Visit: Payer: Self-pay | Admitting: Neurosurgery

## 2010-09-29 DIAGNOSIS — M47816 Spondylosis without myelopathy or radiculopathy, lumbar region: Secondary | ICD-10-CM

## 2010-09-30 ENCOUNTER — Ambulatory Visit
Admission: RE | Admit: 2010-09-30 | Discharge: 2010-09-30 | Disposition: A | Discharge status: Home / Self Care | Payer: Medicare Other | Source: Ambulatory Visit | Attending: Neurosurgery | Admitting: Neurosurgery

## 2010-09-30 DIAGNOSIS — M47816 Spondylosis without myelopathy or radiculopathy, lumbar region: Secondary | ICD-10-CM

## 2010-10-24 ENCOUNTER — Other Ambulatory Visit: Payer: Self-pay | Admitting: Neurosurgery

## 2010-10-24 DIAGNOSIS — M47816 Spondylosis without myelopathy or radiculopathy, lumbar region: Secondary | ICD-10-CM

## 2010-10-26 ENCOUNTER — Ambulatory Visit
Admission: RE | Admit: 2010-10-26 | Discharge: 2010-10-26 | Disposition: A | Discharge status: Home / Self Care | Payer: Medicare Other | Source: Ambulatory Visit | Attending: Neurosurgery | Admitting: Neurosurgery

## 2010-10-26 DIAGNOSIS — M47816 Spondylosis without myelopathy or radiculopathy, lumbar region: Secondary | ICD-10-CM

## 2010-10-26 MED ORDER — IOHEXOL 180 MG/ML  SOLN
1.0000 mL | Freq: Once | INTRAMUSCULAR | Status: AC | PRN
  Administered 2010-10-26: 1 mL via EPIDURAL

## 2010-10-26 MED ORDER — METHYLPREDNISOLONE ACETATE 40 MG/ML INJ SUSP (RADIOLOG
120.0000 mg | Freq: Once | INTRAMUSCULAR | Status: AC
  Administered 2010-10-26: 120 mg via EPIDURAL

## 2010-11-01 ENCOUNTER — Encounter (INDEPENDENT_AMBULATORY_CARE_PROVIDER_SITE_OTHER): Payer: Self-pay | Admitting: General Surgery

## 2010-11-02 ENCOUNTER — Encounter (INDEPENDENT_AMBULATORY_CARE_PROVIDER_SITE_OTHER): Payer: Self-pay | Admitting: Surgery

## 2010-11-02 ENCOUNTER — Ambulatory Visit (INDEPENDENT_AMBULATORY_CARE_PROVIDER_SITE_OTHER): Payer: Medicare Other | Admitting: Surgery

## 2010-11-02 VITALS — BP 142/72 | HR 76 | Temp 96.5°F | Ht 64.5 in | Wt 251.0 lb

## 2010-11-02 DIAGNOSIS — Z853 Personal history of malignant neoplasm of breast: Secondary | ICD-10-CM

## 2010-11-02 NOTE — Progress Notes (Signed)
Morgan [name] and her granddaughter came in today for a postoperative check. 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 She denies any swelling in her left arm. She has not had any right nipple discharges. 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    Physical exam today shows no palpable masses in her left breast flaps. There is no axillary adenopathy on the left side. On the right side is no palpable masses in her breast and no axillary adenopathy.  She has gained weight. We talked about low carbohydrate diets.  Since her LAP-BAND is removed she has no restrictions. I don't appreciate any symptoms of complications from this previous foregut surgery. She had a normal mammogram in December of [place] but I didn't get a copy of that.  Breast exam stable. Plan to see her again in one year in followup.

## 2010-12-15 ENCOUNTER — Ambulatory Visit (INDEPENDENT_AMBULATORY_CARE_PROVIDER_SITE_OTHER): Payer: Medicare Other | Admitting: "Endocrinology

## 2010-12-15 VITALS — BP 128/69 | HR 58 | Wt 248.6 lb

## 2010-12-15 DIAGNOSIS — E11649 Type 2 diabetes mellitus with hypoglycemia without coma: Secondary | ICD-10-CM

## 2010-12-15 DIAGNOSIS — R600 Localized edema: Secondary | ICD-10-CM

## 2010-12-15 DIAGNOSIS — IMO0001 Reserved for inherently not codable concepts without codable children: Secondary | ICD-10-CM

## 2010-12-15 DIAGNOSIS — E1169 Type 2 diabetes mellitus with other specified complication: Secondary | ICD-10-CM

## 2010-12-15 DIAGNOSIS — E669 Obesity, unspecified: Secondary | ICD-10-CM

## 2010-12-15 DIAGNOSIS — I10 Essential (primary) hypertension: Secondary | ICD-10-CM

## 2010-12-15 DIAGNOSIS — E063 Autoimmune thyroiditis: Secondary | ICD-10-CM

## 2010-12-15 DIAGNOSIS — R609 Edema, unspecified: Secondary | ICD-10-CM

## 2010-12-15 DIAGNOSIS — E038 Other specified hypothyroidism: Secondary | ICD-10-CM

## 2010-12-15 DIAGNOSIS — E119 Type 2 diabetes mellitus without complications: Secondary | ICD-10-CM

## 2010-12-15 LAB — T3, FREE: T3, Free: 2.8 pg/mL (ref 2.3–4.2)

## 2010-12-15 LAB — COMPREHENSIVE METABOLIC PANEL
BUN: 43 mg/dL — ABNORMAL HIGH (ref 6–23)
CO2: 28 mEq/L (ref 19–32)
Creat: 1.57 mg/dL — ABNORMAL HIGH (ref 0.50–1.10)
Glucose, Bld: 219 mg/dL — ABNORMAL HIGH (ref 70–99)
Sodium: 135 mEq/L (ref 135–145)
Total Bilirubin: 0.6 mg/dL (ref 0.3–1.2)
Total Protein: 7 g/dL (ref 6.0–8.3)

## 2010-12-15 LAB — TSH: TSH: 4.179 u[IU]/mL (ref 0.350–4.500)

## 2010-12-15 LAB — T4, FREE: Free T4: 1.26 ng/dL (ref 0.80–1.80)

## 2010-12-15 NOTE — Patient Instructions (Signed)
Followup visit in 3 months. Please try to cover all meals and snacks greater than 10 g with NovoLog insulin.

## 2010-12-15 NOTE — Progress Notes (Signed)
CC: FU of T2DM, hypoglycemia, peripheral neuropathy, peripheral angiopathy, hyperlipidemia, hypertension, hypothyroid, thyroiditis, goiter, fatigue, edema, obesity, elevated transaminases, GERD, depression, gout  HPI: 70 y.o. Caucasian woman 1. I first met Morgan [name] in early 2006 when I did a new [name] consultation on Morgan [name], concentrating on her T2DM, hypoglycemia, obesity, hyperlipidemia, hypothyroidism, and hypertension. In Morgan past 6 years her blood glucose control has waxed and waned. Despite putting her on Lantus at bedtime and NovoLog at mealtimes, we have never achieved good glucose control. Hemoglobin A1c values ranged from a low 7.3, to a high at 10.4.  She is currently on 46 units of Lantus in Morgan morning and 30 units in Morgan evening. She is also supposed to be taking NovoLog at meals and at bedtime as needed. However since she usually checks her sugars only once or twice per day, she misses quite a few NovoLog doses. She also usually misses doses of NovoLog whenever she eats out, either in restaurants or family members' homes.  2. Morgan [name] has also been taking Synthroid for many years for her hypothyroidism, secondary to Hashimoto's thyroiditis. She has had several swings of thyroid hormone levels over Morgan past 6 years. Her current dose is 200 mcg per day on 5 days per week and 100 mcg per day on Morgan remaining 2 days per week.. Although that dose had been good for some time, she became hyperthyroid on that dose and was changed to 150 mcg per day in early 2011 and had very good results in April 2011. 3. Her last [facility] visit was on 05.21.12. Because she doesn't ever take notes and doesn't "have Morgan best memory in Morgan world", she has trouble remembering what's happened to her or what meds she is supposed to be taking. She recently saw Dr. Carloyn Manner for evaluation of her vertebral compression fracture. She reportedly also had several bulging discs and several herniated disc. Dr. Carloyn Manner reportedly told  her that because of all of her other physical problems she is not a candidate for surgery. He ordered 2 epidural injections of steroids. Morgan [name] feels that these injections did not work very well. She is currently on pain medications. She can't stand for very long or walk very far. She reportedly also had an abdominal yeast infection below Morgan pannus. This was treated with antibiotics for 2 weeks and a topical cream. Supposedly she was admitted to [facility] in April but she doesn't remember Morgan reasons why or what happened to her there. Se states that her oncologist said that her B12 level was borderline. She states that there are currently no signs of breast cancer. She reportedly had gout in her right foot last week. She was put on some medication, but she does not remember Morgan name. When she entered Morgan office today she stated, "I've been bad, bad girl, but it's not all my fault." 4. PROS: Constitutional: Morgan [name] feels very tired and fatigued. Her back hurts a lot and really limits her movements. She has also had a lot of orthostatic dizziness. Eyes: She is again overdue for her annual eye exam. Her vision remains fairly good. There are no significant eye complaints. Neck: Morgan [name] has no complaints of anterior neck swelling, soreness, tenderness,  pressure, discomfort, or difficulty swallowing.  Heart: Morgan [name] has no complaints of palpitations, irregular heat beats, chest pain, or chest pressure. Gastrointestinal: She alternates between severe constipation and diarrheal episodes.  She has frequent complaints of left upper quadrant and left lower quadrant  pains. She feels that some of Morgan left upper quadrant pains come from her left mid-back.  Legs: Muscle mass and strength seem decreased. She has fairly frequent leg cramps. She has intermittent  edema. Feet: There are no obvious physical foot problems. She has frequent tingling of her feet. 5. BG meter download: She is checking  her sugars once or twice a day most days. She did check her sugar 3 times one day. Blood sugars have ranged from a low of 108 to a high of 431. Her highest blood sugars tend to occur when Morgan period between blood glucose checks is greater than or equal to 12 hours.   Morgan [name]: 1. Still cares for her grandchildren and husband who is an invalid..  ROS: Morgan [name] is not bothered by problems involving her other body systems.  PHYSICAL EXM: BP 128/69  Pulse 58  Wt 248 lb 9.6 oz (112.764 kg)  She has gained 18 pounds since last visit. Constitutional: Morgan [name] looks overweight and tired. Surprisingly, however, her spirits are fairly good today. She was was lucid, coherent, and appropriately interactive today. Eyes: There is no arcus or proptosis.  Mouth: Morgan oropharynx appears normal. Morgan tongue appears normal. There is normal oral moisture. There is no obvious gingivitis. Neck: There are no bruits present. Morgan thyroid gland appears normal in size. Morgan thyroid gland is approximately 20 grams in size. Morgan consistency of Morgan thyroid gland is normal. There is no thyroid tenderness to palpation. Lungs: Morgan lungs are clear. Air movement is good. Heart: Morgan heart rhythm and rate appear normal. Heart sounds S1 and S2 are normal. I do not appreciate any pathologic heart murmurs. Abdomen: Morgan abdominal size is enlarged. Bowel sounds are normal. Morgan abdomen is soft. She had some mild tenderness to palpation of Morgan LUQ and LLQ. There is no obviously palpable hepatomegaly, splenomegaly, or other masses.  Arms: Muscle mass appears appropriate for age. Hands: There is no obvious tremor. Phalangeal and metacarpophalangeal joints appear normal. Palms are normal. Legs: Muscle mass appears somewhat decreased for age. There is trace edema today.  Feet: There are no significant deformities. Dorsalis pedis pulses are trace on Morgan right foot and 1+ on Morgan left foot.  Neurologic: Muscle strength is normal for age and  gender  in both Morgan upper and Morgan lower extremities. Muscle tone appears normal. Sensation to touch is normal in Morgan legs, but decreased mildly in Morgan heels.  Labs: 05.21.12  ASSESSMENT:  1. T2DM: [name] is not doing as well as she could, but is better than she was last November. Given all of her multiple medical problems, Morgan large number of medications she is supposed to take, and her memory problems, this is probably Morgan best that she can do. If she were to follow Morgan DM treatment plan, she would do much better. 2. Hypothyroid/Hyperthyroid: She was hyperthyroid in May. We need to reduce her dose of Synthroid once again. 3. Thyroiditis: Clinically quiescent 4. Neuropathy, peripheral: Actually doing better that I would have expected. Morgan better her BGs are, Morgan fewer neurologic symptoms she will have. 5. Angiopathy: Doing fairly well overall.  6. Hypoglycemia: None recently, or at least none that she has recorded or can remember. 7. Edema: She has only a trace amount of edema today.  8. Obesity: Worse. Although she can't exercise very well because of her back, she is still eating too much of Morgan wrong things.  9. Hypertension: controlled  PLAN: 1. Diagnostic: Will re-check TFTs, CMP,  and urinary microalbumin to creatinine ratio prior to next visit. 2. Therapeutic: [name] will call me in two weeks with her BG results. I can then adjust her insulin plan. Will change her dose of Synthroid to 200 mcg 4 days per week (for example Sunday, Tuesday, Thursday, and Saturday) and 100 mcg 3 days per week ( Monday, Wednesday, and Friday). 3. [name] Education: We discussed Morgan need to check BGs more frequently and take insulin more reliably.  4. Follow-up: 3 months  Level of Service: This visit lasted in excess of 40 minutes. More than 50% of Morgan visit was devoted to counseling.

## 2010-12-16 LAB — MICROALBUMIN / CREATININE URINE RATIO: Creatinine, Urine: 82.9 mg/dL

## 2011-03-23 ENCOUNTER — Ambulatory Visit (INDEPENDENT_AMBULATORY_CARE_PROVIDER_SITE_OTHER): Payer: Medicare Other | Admitting: "Endocrinology

## 2011-03-23 ENCOUNTER — Encounter: Payer: Self-pay | Admitting: "Endocrinology

## 2011-03-23 VITALS — BP 188/88 | HR 54 | Wt 243.5 lb

## 2011-03-23 DIAGNOSIS — R609 Edema, unspecified: Secondary | ICD-10-CM

## 2011-03-23 DIAGNOSIS — E038 Other specified hypothyroidism: Secondary | ICD-10-CM

## 2011-03-23 DIAGNOSIS — E063 Autoimmune thyroiditis: Secondary | ICD-10-CM

## 2011-03-23 DIAGNOSIS — I10 Essential (primary) hypertension: Secondary | ICD-10-CM

## 2011-03-23 DIAGNOSIS — E1169 Type 2 diabetes mellitus with other specified complication: Secondary | ICD-10-CM

## 2011-03-23 DIAGNOSIS — IMO0001 Reserved for inherently not codable concepts without codable children: Secondary | ICD-10-CM

## 2011-03-23 DIAGNOSIS — M109 Gout, unspecified: Secondary | ICD-10-CM

## 2011-03-23 DIAGNOSIS — E11649 Type 2 diabetes mellitus with hypoglycemia without coma: Secondary | ICD-10-CM

## 2011-03-23 NOTE — Patient Instructions (Signed)
Followup visits with me in 3 months. Please increase lisinopril to 20 mg in the morning and 10 mg in the evening. Please schedule followup visit with Dr. Bea Graff in about one week to check blood pressure. Please increase your Lantus doses to 50 units in the morning and 32 units in the evening. Once you are off prednisone, please reduce the Lantus back to 26 in the morning and 30 in the evening.

## 2011-03-23 NOTE — Progress Notes (Signed)
CC: FU of T2DM, hypoglycemia, peripheral neuropathy, peripheral angiopathy, hyperlipidemia, hypertension, hypothyroid, thyroiditis, goiter, fatigue, edema, obesity, elevated transaminases, GERD, depression, gout  HPI: 70 y.o. Caucasian woman 1. I first met Morgan [name] in early 2006 when I did a new patient consultation on her, concentrating on her T2DM, hypoglycemia, obesity, hyperlipidemia, hypothyroidism, and hypertension. In the past 6 years her blood glucose control has waxed and waned. Despite putting her on Lantus at bedtime and NovoLog at mealtimes, we have never achieved good glucose control. Hemoglobin A1c values ranged from a low of 7.3, to a high of 10.4%.  She is currently on 46 units of Lantus in the morning and 30 units in the evening. She is also supposed to be taking NovoLog at meals and at bedtime as needed. However since she usually checks her sugars only once or twice per day, she misses quite a few NovoLog doses. She also usually misses doses of NovoLog whenever she eats out, either in restaurants or family members' homes.  2. Morgan [name] has also been taking Synthroid for many years for her hypothyroidism, secondary to Hashimoto's thyroiditis. She has had several swings of thyroid hormone levels over the past 6 years. Her current dose is 200 mcg per day on 4 days per week and 100 mcg per day on the remaining 3 days per week.  3. Her last [facility] visit was on 09.06.12. She had one episode of gout in October involving her right foot and a second episode of gout in November involving her left foot. Because of her chronic renal insufficiency, she cannot take allopurinol. Instead she is treated with colchicine and prednisone for each attack. She [name] be on prednisone, 40 mg per day, for one additional week. In addition, she developed a rash on gabapentin, so she stopped the drug. 4. PROS: Constitutional: The patient feels "not wonderful". She is very tired and fatigued. Her back hurts a lot and  limits her movements. She has also had some  orthostatic dizziness. Eyes: She is again overdue for her annual eye exam. Her vision is not as good. She feels that her cataracts are getting worse. There are no other significant eye complaints. Neck: The patient has no complaints of anterior neck swelling, soreness, tenderness,  pressure, discomfort, or difficulty swallowing. She does have episodes of the posterior neck pain on the left side, consistent with her previous pinched nerve.  Heart: The patient has no complaints of palpitations, irregular heat beats, chest pain, or chest pressure. Gastrointestinal: Bowel movements are more normal, but she still has occasional constipation.  Legs: Muscle mass and strength seem decreased. She has occasional leg cramps. She has intermittent  edema. Feet: Her left foot is still painful due to gout. There are no obvious physical foot problems. She has frequent tingling of her feet. Hypoglycemia: No episodes recently Emotional: She is more depressed recently. She lost her mother at [employer] some years ago. She also has a good friend that is dying now.  5. BG meter download: She is checking her sugars 1-4 times a day, for an average of twice per day.  Blood sugars have ranged from a low of 98 to a high of 569. Her highest blood sugars tend to occur when she is being treated with prednisone.    PAST MEDICAL, FAMILY, AND SOCIAL HISTORY  1. Work and family: Still cares for her grandchildren and husband who is an invalid. Her husband had knees surgery recently. Her daughter had shoulder surgery recently. She had a  mammogram last month and was negative. Her oncology followup next month. 2. Activities: Caring for family members as above and attending church functions. 3. Tobacco, alcohol, or illicit drugs: None 4. Primary care provider: Dr. Bea Graff  ROS: Morgan [name] is not bothered by problems involving her other body systems.  PHYSICAL EXM: BP 188/88  Pulse 54  Wt 243  lb 8 oz (110.451 kg)  She has lost 5 pounds since last visit. She did not take her blood pressure medicines this morning since she was driving to this appointment. Constitutional: The patient looks pretty good today. She was was lucid, coherent, and appropriately interactive today. As usual, she has great difficulty in remembering dates and times of when she had medical care or started and stopped medications. Eyes: There is no arcus or proptosis.  Mouth: The oropharynx appears normal. The tongue appears normal. There is normal oral moisture. There is no obvious gingivitis. Neck: There are no bruits present. The thyroid gland appears normal in size. The thyroid gland is approximately 20+ grams in size. The consistency of the thyroid gland is relatively firm. There is no thyroid tenderness to palpation. Lungs: The lungs are clear. Air movement is good. Heart: The heart rhythm and rate appear normal. Heart sounds S1 and S2 are normal. I do not appreciate any pathologic heart murmurs. Abdomen: The abdominal size is enlarged. Bowel sounds are normal. The abdomen is soft. She had some mild tenderness to palpation of the LUQ and LLQ. There is no obviously palpable hepatomegaly, splenomegaly, or other masses.  Arms: Muscle mass appears appropriate for age. Hands: There is no obvious tremor. Phalangeal and metacarpophalangeal joints appear normal. Palms are normal. Legs: Muscle mass appears somewhat decreased for age. There is trace to 1+ edema today.  Feet: There are no significant deformities. Dorsalis pedis pulses are faint 1+ on the right foot and 1+ on the left foot. All of the toe joints of her left foot are tender. Neurologic: Muscle strength is normal for age and gender  in both the upper and the lower extremities. Muscle tone appears normal. Sensation to touch is normal in the legs, but decreased mildly in the heels.  Labs: 09.06.12: Serum creatinine was 1.57. AST was 54 and ALT was 44. Albumin was  3.9. Hemoglobin A1c was 8.9%. TSH was 4.179. Free T4 was 1.26. Free T3 was 2.8. Urinary microalbumin: Creatinine ratio was 89.9.  03/17/11: Creatinine was 1.27, AST was 43, and ALT was 45. ESR was 49.  Hemoglobin A1c today was 8.3%. This is an improvement from 8.9% at last visit.  ASSESSMENT:  1. T2DM: Given all of the steroids that she's had recently, it is surprising that her hemoglobin A1c is doing as well as it is. Patient is not doing as well as she could, but is better than she was last November. Given all of her multiple medical problems, the large number of medications she is supposed to take, and her memory problems, this is probably the best that she can do. If she were to follow the DM treatment plan more compliantly, she would do much better. She needs a higher dose of Lantus throughout the night and day. 2. Hypothyroid/Hyperthyroid: She was hyperthyroid in May. She was somewhat hypothyroid in September. I suspect that part of these changes is her forgetfulness about taking medications. Instead of changing the dose of Synthroid today, I'd like to repeat her lab tests to see where we are now.  3. Thyroiditis: Clinically quiescent 4. Neuropathy, peripheral: She  is actually doing pretty well.  5. Angiopathy: She is doing a little better today. .  6. Hypoglycemia: None recently, or at least none that she has recorded or can remember. 7. Edema: She has only a trace amount of edema today.  8. Obesity: Better. She seems to be making more of an effort to eat better. 9. Hypertension: Her blood pressure is very high today. Part of this may be a fluid retention affect. She does need to take her medications every day. 10. Urinary microalbuminuria: Although the patient's microalbumin to creatinine ratio is elevated at 89.9, it is substantially improved from 2 years ago when it was 706.4.  PLAN: 1. Diagnostic: [name] re-check TFTs. 2. Therapeutic: Increase Lantus dose to 50 units in the morning and 32  units at supper. Increase lisinopril to 20 mg in the morning and 10 mg in the evening. Patient [name] call me in two weeks with her BG results. See Dr. Bea Graff for followup of blood pressure. 3. Patient Education: We discussed the need to check BGs more frequently and to take insulin and oral medications more reliably.  4. Follow-up: 3 months  Level of Service: This visit lasted in excess of 40 minutes. More than 50% of the visit was devoted to counseling.

## 2011-04-10 LAB — TSH: TSH: 1.19 u[IU]/mL (ref 0.350–4.500)

## 2011-04-10 LAB — T3, FREE: T3, Free: 2.1 pg/mL — ABNORMAL LOW (ref 2.3–4.2)

## 2011-06-21 ENCOUNTER — Ambulatory Visit: Payer: Medicare Other | Admitting: "Endocrinology

## 2011-07-14 ENCOUNTER — Other Ambulatory Visit: Payer: Self-pay | Admitting: Neurosurgery

## 2011-07-14 DIAGNOSIS — Q761 Klippel-Feil syndrome: Secondary | ICD-10-CM

## 2011-07-21 ENCOUNTER — Ambulatory Visit
Admission: RE | Admit: 2011-07-21 | Discharge: 2011-07-21 | Disposition: A | Discharge status: Home / Self Care | Payer: Medicare Other | Source: Ambulatory Visit | Attending: Neurosurgery | Admitting: Neurosurgery

## 2011-07-21 DIAGNOSIS — Q761 Klippel-Feil syndrome: Secondary | ICD-10-CM

## 2011-09-05 ENCOUNTER — Other Ambulatory Visit: Payer: Self-pay | Admitting: "Endocrinology

## 2011-09-06 ENCOUNTER — Encounter (INDEPENDENT_AMBULATORY_CARE_PROVIDER_SITE_OTHER): Payer: Self-pay | Admitting: Surgery

## 2011-09-11 ENCOUNTER — Ambulatory Visit: Payer: Medicare Other | Admitting: "Endocrinology

## 2011-09-11 ENCOUNTER — Encounter: Payer: Self-pay | Admitting: "Endocrinology

## 2011-09-11 ENCOUNTER — Ambulatory Visit (INDEPENDENT_AMBULATORY_CARE_PROVIDER_SITE_OTHER): Payer: Medicare Other | Admitting: "Endocrinology

## 2011-09-11 VITALS — BP 126/72 | HR 63 | Wt 243.6 lb

## 2011-09-11 DIAGNOSIS — E038 Other specified hypothyroidism: Secondary | ICD-10-CM

## 2011-09-11 DIAGNOSIS — E063 Autoimmune thyroiditis: Secondary | ICD-10-CM

## 2011-09-11 DIAGNOSIS — E669 Obesity, unspecified: Secondary | ICD-10-CM

## 2011-09-11 DIAGNOSIS — E1151 Type 2 diabetes mellitus with diabetic peripheral angiopathy without gangrene: Secondary | ICD-10-CM

## 2011-09-11 DIAGNOSIS — R600 Localized edema: Secondary | ICD-10-CM

## 2011-09-11 DIAGNOSIS — I10 Essential (primary) hypertension: Secondary | ICD-10-CM

## 2011-09-11 DIAGNOSIS — E11649 Type 2 diabetes mellitus with hypoglycemia without coma: Secondary | ICD-10-CM

## 2011-09-11 DIAGNOSIS — E1169 Type 2 diabetes mellitus with other specified complication: Secondary | ICD-10-CM

## 2011-09-11 DIAGNOSIS — E1149 Type 2 diabetes mellitus with other diabetic neurological complication: Secondary | ICD-10-CM

## 2011-09-11 DIAGNOSIS — I798 Other disorders of arteries, arterioles and capillaries in diseases classified elsewhere: Secondary | ICD-10-CM

## 2011-09-11 DIAGNOSIS — G609 Hereditary and idiopathic neuropathy, unspecified: Secondary | ICD-10-CM

## 2011-09-11 DIAGNOSIS — R609 Edema, unspecified: Secondary | ICD-10-CM

## 2011-09-11 DIAGNOSIS — E1142 Type 2 diabetes mellitus with diabetic polyneuropathy: Secondary | ICD-10-CM

## 2011-09-11 DIAGNOSIS — E1159 Type 2 diabetes mellitus with other circulatory complications: Secondary | ICD-10-CM

## 2011-09-11 LAB — POCT GLYCOSYLATED HEMOGLOBIN (HGB A1C): Hemoglobin A1C: 9.5

## 2011-09-11 LAB — GLUCOSE, POCT (MANUAL RESULT ENTRY): POC Glucose: 239 mg/dl — AB (ref 70–99)

## 2011-09-11 LAB — COMPREHENSIVE METABOLIC PANEL
ALT: 57 U/L — ABNORMAL HIGH (ref 0–35)
AST: 51 U/L — ABNORMAL HIGH (ref 0–37)
Chloride: 101 mEq/L (ref 96–112)
Creat: 1.32 mg/dL — ABNORMAL HIGH (ref 0.50–1.10)
Sodium: 138 mEq/L (ref 135–145)
Total Bilirubin: 0.6 mg/dL (ref 0.3–1.2)
Total Protein: 7.1 g/dL (ref 6.0–8.3)

## 2011-09-11 NOTE — Progress Notes (Addendum)
CC: FU of T2DM, hypoglycemia, peripheral neuropathy, peripheral angiopathy, hyperlipidemia, hypertension, hypothyroid, thyroiditis, goiter, fatigue, edema, obesity, elevated transaminases, GERD, depression, gout  HPI: The patient is a 71 y.o. Caucasian woman. She was unaccompanied. 1. I first met Morgan [name] in early 2006 when I did a new patient consultation on her, concentrating on her T2DM, hypoglycemia, obesity, hyperlipidemia, hypothyroidism, and hypertension. In the past 7 years her blood glucose control has waxed and waned. Despite putting her on Lantus at bedtime and NovoLog at mealtimes, we have never achieved good glucose control. Her hemoglobin A1c values ranged from a low of 7.3, to a high of 10.4%.  She is currently on 26 units of Lantus in the morning and 30 units in the evening. She is also supposed to be taking NovoLog at meals and at bedtime as needed. However since she usually checks her sugars only once or twice per day, she misses quite a few NovoLog doses. She also usually misses doses of NovoLog whenever she eats out, either in restaurants or family members' homes.  2. Morgan [name] has also been taking Synthroid for many years for her hypothyroidism, secondary to Hashimoto's thyroiditis. She has had several swings of thyroid hormone levels over the past 6 years. Her current dose is 200 mcg per day on 4 days per week and 100 mcg per day on the remaining 3 days per week.  3. Her last [facility] visit was on 03/23/11. In the interim her FMS has been worse and her Cymbalta medication has been increased. She is seeing Dr. Estanislado Pandy in rheumatology. She has had 3 episodes of gout. Because of her chronic renal insufficiency, she cannot take allopurinol. Instead she is treated with colchicine and prednisone for each attack. She has also been having left posterior neck pains. She has also been receiving Euflexus shots in her left knee. Her left hand carpal tunnel syndrome is also worse. Three weeks ago  she attempted to get up out of bed, had orthostatic hypotension, fell, and sprained her foot. She was in a boot for 3 weeks. She is still having a little pain in the foot and is using a cane for both support and balance. She remains on Synthroid, 200 mcg 4 days per week and 100 mcg 3 days per week. She reduced Lantus to 26 units in AM and 30 in PM. She is also using Novolog aspart at meals. 4. Pertinent Review of systems: Constitutional: The patient feels "kind of tired today, but felt pretty good yesterday". She is fatigued. Her back also hurts a lot and limits her movements. She has also had some  orthostatic dizziness at times as above. Eyes: She had her annual eye exam in February. There were no signs of diabetic eye damage, but she does have the beginnings of cataracts. Her vision is better. There are no other significant eye complaints. Neck: The patient has no complaints of anterior neck swelling, soreness, tenderness,  pressure, discomfort, or difficulty swallowing. She does have episodes of the posterior neck pain on the left side, consistent with her previous pinched nerve.  Heart: The patient has no complaints of palpitations, irregular heat beats, chest pain, or chest pressure. Gastrointestinal: She has had some heartburn recently, often after fish oil. Bowel movements are more normal, but she still has occasional constipation.  Legs: Muscle mass and strength seem decreased. She has occasional leg cramps. She still has intermittent tingling in her legs and intermittent  edema. Feet: Her left foot is still painful due to  gout. There are no obvious physical foot problems. She has infrequent tingling of her toes. The numbness in her feet is worse.  Hypoglycemia: No episodes recently Emotional: Husband stays in bed a lot, which bothers her.  5. BG meter download: She is checking her sugars 0-3 times a day, with one 5-day period without any BG checks at all. She usually checks in the AM, rarely at  lunch, sometimes at supper, and sometimes at bedtime. She is missing a lot of opportunities to take correction doses and food doses of Novolog. Blood sugars have ranged from a low of 125 to a high of 480. Her highest blood sugars tend to occur when she is being treated with prednisone.    PAST MEDICAL, FAMILY, AND SOCIAL HISTORY  1. Work and family: Her husband is working part-time for their son. Morgan [name] feels that her husband has been depressed for a long time, but he has refused anti-depressant meds in the past. Still cares for her grandson every afternoon. Her oncology followup is later this month. 2. Activities: Caring for family members as above and attending church functions. 3. Tobacco, alcohol, or illicit drugs: None 4. Primary care provider: Dr. Bea Graff  REVIEW OF SYSTEMS: Morgan [name] is not bothered by problems involving her other body systems.  PHYSICAL EXAM: BP 126/72  Pulse 63  Wt 243 lb 9.6 oz (110.496 kg)  She has maintained her weight since last visit.   Constitutional: The patient looks tired and depressed today. She walks with a limp. She was lucid, coherent, but somewhat passive today.  Eyes: There is no arcus or proptosis.  Mouth: The oropharynx appears normal. The tongue appears normal. There is normal oral moisture. There is no obvious gingivitis. Neck: There are no bruits present. The thyroid gland appears normal in size. The thyroid gland is approximately 20+ grams in size. The consistency of the thyroid gland is relatively firm. There is no thyroid tenderness to palpation. Lungs: The lungs are clear. Air movement is good. Heart: The heart rhythm and rate appear normal. Heart sounds S1 and S2 are normal. I do not appreciate any pathologic heart murmurs. Abdomen: The abdominal size is enlarged. Bowel sounds are normal. The abdomen is soft. The abdomen is non-tender today. There is no obviously palpable hepatomegaly, splenomegaly, or other masses.  Arms: Muscle mass  appears appropriate for age. Hands: Her left wrist and hand are in a splint. There is no obvious tremor. Phalangeal and metacarpophalangeal joints appear normal. Palms are normal. Legs: Muscle mass appears somewhat decreased for age. There is 1+ edema today.  Feet: There are no significant deformities. Dorsalis pedis pulses are faint 1+ bilaterally.. Neurologic: Muscle strength is low-normal for age and gender  in both the upper and the lower extremities. Muscle tone appears normal. Sensation to touch is normal in the legs, but decreased mildly in the heels.  Labs: Hemoglobin A1c today is 9.5%.            12.13.12: Hemoglobin A1c was 8.9%. TSH was 1.19. Free T4 was 1.23. Free T3 was 2.1.   ASSESSMENT:  1. T2DM: Given all of the steroids that she's had recently, it is not surprising that her hemoglobin A1c is worse. She is not, however, doing all that she can to control the BG levels. Given all of her multiple medical problems, the large number of medications she is supposed to take, and her memory problems, it is hard to accomplish all of her daily self-care tasks. She needs a  higher dose of Lantus throughout the night and day. 2. Hypothyroid/Hyperthyroid: She was euthyroid in December.   3. Thyroiditis: Clinically quiescent 4. Neuropathy, peripheral: This is relatively mild, but has worsened as the BGS have risen.   5. Angiopathy: She is doing a little worse, c/w her reduction in walking.   6. Hypoglycemia: None recently. 7. Edema: She has somewhat more edema today.   8. Obesity:Her weight is static. 9. Hypertension: Her blood pressure is good today. She does need to take her medications every day.  PLAN: 1. Diagnostic: Will re-check TFTs. 2. Therapeutic: Increase Lantus dose to 28 units in the morning and 32 units at supper. See Dr. Bea Graff for followup of blood pressure. 3. Patient Education: We discussed the need to check BGs more frequently and to take insulin and oral medications more  reliably.  4. Follow-up: 4 months  Level of Service: This visit lasted in excess of 40 minutes. More than 50% of the visit was devoted to counseling.  Morgan [name]   Addendum:   Labs 09/11/11: CMP: Glucose 157, BUN 38, creatinine 1.32, alkaline phosphatase 124, AST 51, ALT 57, calcium 9.5 TFTs: TSH 1.337, free T4 1.20, free T3 2.6   Assessment: 1. Renal status has improved since 12/15/10. 2. LFTs are mildly elevated and essentially unchanged since 5.21.12. 3. Alkaline phosphatase is relatively elevated. Will re-assess at next visit. 4. TFTs are normal on her current dose of Synthroid. Morgan [name]

## 2011-09-11 NOTE — Patient Instructions (Signed)
Followup visit in 4 months. Please see Dr. Dwana Melena for blood pressure checks. Please increase Lantus to 28 units in the morning and 32 units in the evening.

## 2011-09-12 LAB — MICROALBUMIN / CREATININE URINE RATIO
Creatinine, Urine: 47.4 mg/dL
Microalb, Ur: 2.22 mg/dL — ABNORMAL HIGH (ref 0.00–1.89)

## 2011-10-04 ENCOUNTER — Telehealth: Payer: Self-pay | Admitting: *Deleted

## 2011-10-04 NOTE — Telephone Encounter (Signed)
09/11/11 lab results called to pt.  Per Dr. Tobe Sos: 1. Kidney studies are at the low end of normal, but improved from 9 mo. Ago. 2. Liver studies mildly elevated.  It has been going on for years and is probably related to her obesity. 3. Thyroid studies are normal. 4. Urine protein is mildly elevated, but is only half of what it was 9 mo ago.  I recommended to Morgan [name] that may want to focus on losing 1-2 lbs/mo through diet, better blood sugar control and if okay with her PCP, short mild increases in her physical activity 1-2 times daily.

## 2011-10-04 NOTE — Telephone Encounter (Signed)
Morgan [name] shared that she sees Dr. Bo Merino in [place] for Fibromyalgia.  Dr. Bo Merino put her on Cymbalta for the Fibromyalgia, which has helped.  However the physician wants to double Morgan [name]'s Cymbalta dose, but needs the results of her Liver studies prior to making that decision.  Morgan [name] will call back with Dr. Bufford Spikes fax number and the labs will be sent.

## 2011-12-14 ENCOUNTER — Other Ambulatory Visit: Payer: Self-pay | Admitting: *Deleted

## 2011-12-14 DIAGNOSIS — E038 Other specified hypothyroidism: Secondary | ICD-10-CM

## 2011-12-14 MED ORDER — LEVOTHYROXINE SODIUM 200 MCG PO TABS: 200.0000 ug | ORAL_TABLET | Freq: Every day | ORAL | Status: DC

## 2012-01-04 ENCOUNTER — Ambulatory Visit (INDEPENDENT_AMBULATORY_CARE_PROVIDER_SITE_OTHER): Payer: Medicare Other | Admitting: "Endocrinology

## 2012-01-04 ENCOUNTER — Encounter: Payer: Self-pay | Admitting: "Endocrinology

## 2012-01-04 VITALS — BP 155/72 | HR 66 | Wt 240.5 lb

## 2012-01-04 DIAGNOSIS — E063 Autoimmune thyroiditis: Secondary | ICD-10-CM | POA: Insufficient documentation

## 2012-01-04 DIAGNOSIS — I798 Other disorders of arteries, arterioles and capillaries in diseases classified elsewhere: Secondary | ICD-10-CM

## 2012-01-04 DIAGNOSIS — I10 Essential (primary) hypertension: Secondary | ICD-10-CM

## 2012-01-04 DIAGNOSIS — IMO0001 Reserved for inherently not codable concepts without codable children: Secondary | ICD-10-CM

## 2012-01-04 DIAGNOSIS — R600 Localized edema: Secondary | ICD-10-CM

## 2012-01-04 DIAGNOSIS — E1351 Other specified diabetes mellitus with diabetic peripheral angiopathy without gangrene: Secondary | ICD-10-CM

## 2012-01-04 DIAGNOSIS — E1149 Type 2 diabetes mellitus with other diabetic neurological complication: Secondary | ICD-10-CM

## 2012-01-04 DIAGNOSIS — G909 Disorder of the autonomic nervous system, unspecified: Secondary | ICD-10-CM

## 2012-01-04 DIAGNOSIS — E11649 Type 2 diabetes mellitus with hypoglycemia without coma: Secondary | ICD-10-CM

## 2012-01-04 DIAGNOSIS — E1142 Type 2 diabetes mellitus with diabetic polyneuropathy: Secondary | ICD-10-CM

## 2012-01-04 DIAGNOSIS — R609 Edema, unspecified: Secondary | ICD-10-CM

## 2012-01-04 DIAGNOSIS — E038 Other specified hypothyroidism: Secondary | ICD-10-CM

## 2012-01-04 DIAGNOSIS — E162 Hypoglycemia, unspecified: Secondary | ICD-10-CM

## 2012-01-04 DIAGNOSIS — E049 Nontoxic goiter, unspecified: Secondary | ICD-10-CM

## 2012-01-04 DIAGNOSIS — E1169 Type 2 diabetes mellitus with other specified complication: Secondary | ICD-10-CM

## 2012-01-04 LAB — GLUCOSE, POCT (MANUAL RESULT ENTRY): POC Glucose: 205 mg/dl — AB (ref 70–99)

## 2012-01-04 LAB — POCT GLYCOSYLATED HEMOGLOBIN (HGB A1C): Hemoglobin A1C: 9.4

## 2012-01-04 NOTE — Patient Instructions (Signed)
Follow up visit in 4 months. Please check BG at meals and at bedtime. Please take Lantus twice a day as she currently does. Please take novolog at meals and at bedtime if needed.

## 2012-01-04 NOTE — Progress Notes (Signed)
CC: FU of T2DM, hypoglycemia, peripheral neuropathy, peripheral angiopathy, hyperlipidemia, hypertension, hypothyroid, thyroiditis, goiter, fatigue, edema, obesity, elevated transaminases, GERD, depression, gout  HPI: The patient is a 71 y.o. Caucasian woman. She was unaccompanied. 1. I first met Morgan [name] in early 2006 when I did a new patient consultation on her, concentrating on her T2DM, hypoglycemia, obesity, hyperlipidemia, hypothyroidism, and hypertension. In the past 7 years her blood glucose control has waxed and waned. Despite putting her on Lantus at bedtime and NovoLog at mealtimes, we have never achieved good glucose control, largely due to her noncompliance with her dietary and diabetic management plans. Her hemoglobin A1c values have ranged from a low of 7.%, to a high of 10.4%.  She is currently on 42 units of Lantus in the morning and 32 units in the evening. She is also supposed to be taking NovoLog at meals and at bedtime as needed. However since she usually checks her sugars only once or twice per day, she misses at least half of her NovoLog doses. She also usually misses doses of NovoLog whenever she eats out, either in restaurants or family members' homes.  2. Morgan [name] has also been taking Synthroid for many years for her hypothyroidism, secondary to Hashimoto's thyroiditis. She has had several swings of thyroid hormone levels over the past 6 years. Her current dose is 200 mcg per day on 4 days per week and 100 mcg per day on the remaining 3 days per week.  3. Her last [facility] visit was on 09/11/11. In July she had a lot of back problems and continues to have problems. Her FMS has been severe, despite increasing her Cymbalta. Her knees are also hurting a lot. She has not had any gout attacks recently. She has also been having left posterior neck pains. Her left hand carpal tunnel syndrome is also severe. She still uses her cane for support and balance.  4. Pertinent Review of  systems: Constitutional: " I feel better in the last couple of weeks than I've felt in a while." She continues to have orthostatic dizziness at times.  Eyes: She had her annual eye exam in February. There were no signs of diabetic eye damage, but she does have the beginnings of cataracts. Her vision is better. There are no other significant eye complaints. Neck: The patient has no complaints of anterior neck swelling, soreness, tenderness,  pressure, or discomfort. She sometimes notes a feeling of having a "glob" stuck in her upper throat fos a brief second or two. She has a lot of allergic rhinitis. She does have episodes of the posterior neck pain on the left side, consistent with her previous pinched nerve.  Heart: The patient has no complaints of palpitations, irregular heat beats, chest pain, or chest pressure. Gastrointestinal: She has had some burping and heartburn recently, often after taking fish oil. Bowel movements are more normal, but she still has occasional constipation.  Legs: Muscle mass and strength seem decreased. She has not had any leg cramps for several months. She still has intermittent tingling in her legs and intermittent edema. Feet: There are no obvious physical foot problems. She has infrequent tingling of her toes.   Hypoglycemia: No episodes recently Emotional: She is doing better.   5. BG meter download: She is checking her sugars 1-3 times a day, mostly at breakfast and at bedtime. She usually does not check BG at lunch or supper and does not take Novolog at those times as well. BGs vary from 105-504.  She is missing a lot of opportunities to take correction doses and food doses of Novolog.   PAST MEDICAL, FAMILY, AND SOCIAL HISTORY  1. Work and family: Her husband is working part-time for their son. She still cares for her grandson occasionally. Her oncology follow up visits have gone well. A very close, long-time friend died earlier today, so she is quite sad.  2.  Activities: Caring for family members as above and attending church functions. 3. Tobacco, alcohol, or illicit drugs: None 4. Primary care provider: Dr. Bea Graff  REVIEW OF SYSTEMS: Morgan [name] is not bothered by problems involving her other body systems.  PHYSICAL EXAM: BP 155/72  Pulse 66  Wt 240 lb 8 oz (109.09 kg)  She has had a 3.5 lb decrease in weight.    Constitutional: The patient looks better today. She does not look tired or depressed, but is fairly passive today, as usual. She is sad about her friend's death. She is very lucid and appropriate today.   Eyes: There is no arcus or proptosis.  Face: She has several areas of excoriation on her face and anterior neck. They do not look acutely infected.  Mouth: The oropharynx appears normal. The tongue appears normal. There is normal oral moisture. There is no obvious gingivitis. Neck: There are no bruits present. The thyroid gland appears normal in size. The thyroid gland is slightly enlarged, at approximately 20+ grams in size. The consistency of the thyroid gland is relatively firm. There is no thyroid tenderness to palpation. Lungs: The lungs are clear. Air movement is good. Heart: The heart rhythm and rate appear normal. Heart sounds S1 and S2 are normal. I do not appreciate any pathologic heart murmurs. Abdomen: The abdomen is enlarged. Bowel sounds are normal. The abdomen is soft. The abdomen is non-tender today. There is no obviously palpable hepatomegaly, splenomegaly, or other masses.  Arms: Muscle mass appears appropriate for age. Hands: She is not wearing her left wrist splint today. There is no obvious tremor. Phalangeal and metacarpophalangeal joints appear normal. Palms are normal. Legs: Muscle mass appears somewhat decreased for age. There is trace edema today.  Feet: There are no significant deformities. She has 1-2+ calluses and 1-2+ tinea pedis. Dorsalis pedis pulses are faint 1+ bilaterally.  Neurologic: Muscle strength  is low-normal for age and gender  in both the upper and the lower extremities. Muscle tone appears normal. Sensation to touch is normal in the legs, but decreased mildly in the right heel.  Labs: Hemoglobin A1c today is 9.4% today, compared with 9.5% at last visit.            09/11/11: TSH was 1.337. Free T4 was 1.20. Free T3 was 2.6.; Microalbumin/creatinine ratio: 46; Serum creatinine was 1.32, decreased from 1.57 last September.  ASSESSMENT:  1. T2DM: The patient does a pretty good job of checking BGs twice a day and   taking Lantus twice daily. She often takes Novolog twice daily. She can't seem to make herself check sugars and take Novolog at lunch and dinner. Given all of her multiple medical problems, the large number of medications she is supposed to take, and her memory problems, it is hard to accomplish all of her daily self-care tasks. She could benefit from higher dose of Lantus throughout the night and day, but if she doesn't eat or is more active than usual, she is at risk for developing severe hypoglycemia.  2. Hypoglycemia: None recently  3. Hypothyroid/Hyperthyroid: She was euthyroid in December and  again in June.   4. Thyroiditis: Clinically quiescent 5. Neuropathy, peripheral: This is relatively mild, but has worsened as the BGS have risen.   6. Angiopathy: She is doing about the same, c/w her reduction in walking.   7. Edema: She has less edema today.   8. Obesity:Her weight has improved.. 9. Hypertension: She is feeling stressed today by her friend's death. She does need to take her medications every day.  PLAN: 1. Diagnostic: No labs today.  2. Therapeutic: Continue Lantus dose of 42 units in the morning and 32 units at supper. See Dr. Bea Graff for follow up of blood pressure. 3. Patient Education: We discussed the need to check BGs more frequently and to take insulin and oral medications more reliably. Once she loses a kidney cell or an eye cell, they are gone forever.  4.  Follow-up: 4 months  Level of Service: This visit lasted in excess of 40 minutes. More than 50% of the visit was devoted to counseling.  Sherrlyn Hock

## 2012-01-25 ENCOUNTER — Ambulatory Visit: Payer: Medicare Other | Admitting: "Endocrinology

## 2012-01-26 ENCOUNTER — Other Ambulatory Visit: Payer: Self-pay | Admitting: *Deleted

## 2012-02-23 ENCOUNTER — Telehealth (INDEPENDENT_AMBULATORY_CARE_PROVIDER_SITE_OTHER): Payer: Self-pay | Admitting: General Surgery

## 2012-02-23 NOTE — Telephone Encounter (Signed)
Pt called for new prescription for prosthesis and bras.  Needs it dated:  Feb 23, 2012, so she can take them today.  Please FAX to [facility] [phone]) when signed by Dr. Hassell Done.

## 2012-02-26 ENCOUNTER — Telehealth (INDEPENDENT_AMBULATORY_CARE_PROVIDER_SITE_OTHER): Payer: Self-pay | Admitting: General Surgery

## 2012-02-26 NOTE — Telephone Encounter (Signed)
Faxed Rx for mastectomy products [facility].

## 2012-03-12 ENCOUNTER — Telehealth (INDEPENDENT_AMBULATORY_CARE_PROVIDER_SITE_OTHER): Payer: Self-pay

## 2012-03-12 NOTE — Telephone Encounter (Signed)
Returned pt's call regarding her Rx.  Pt states that the Rx is to be for a breast prosthetic and that she was told by [facility] that the Rx should not have a number of prosthetics listed.  (This didn't sound right to me, so I called [facility] to verify what she had said.  They have had a change in their protocol in regards to Medicaid - so yes, we should not list an amount of prosthetics the Pt can receive.)  I told the pt that Dr. Hassell Done would be out of the office until tomorrow afternoon, and that I would call her back when he re-does the Rx.

## 2012-03-14 ENCOUNTER — Telehealth (INDEPENDENT_AMBULATORY_CARE_PROVIDER_SITE_OTHER): Payer: Self-pay

## 2012-03-14 NOTE — Telephone Encounter (Signed)
LMOM stating that I have faxed the pt's Rx to [facility].

## 2012-03-14 NOTE — Telephone Encounter (Signed)
Returned pt's call regarding her Rx.  I told the pt that Dr. Hassell Done would be in the office for a short while this afternoon and that I would have him re-write her Rx and fax it to [facility] before 5p.  I also told her I would call her as I soon as I faxed it.  Pt was very thankful.

## 2012-04-08 ENCOUNTER — Other Ambulatory Visit: Payer: Self-pay | Admitting: *Deleted

## 2012-04-08 DIAGNOSIS — E038 Other specified hypothyroidism: Secondary | ICD-10-CM

## 2012-04-25 ENCOUNTER — Telehealth: Payer: Self-pay | Admitting: *Deleted

## 2012-04-25 NOTE — Telephone Encounter (Signed)
We received a fax from [place] re. CMS/Government random Audit re. Pt's diabetes supplies.  When I called their number, the automated line informed me that they had all the documentation; however, another organization may also request records. I spoke with Elmyra Ricks at the [phone] number. She informed me that they were still waiting for the progress notes and documentation. I will fax everything to her today.

## 2012-05-08 ENCOUNTER — Ambulatory Visit: Payer: Medicare Other | Admitting: "Endocrinology

## 2012-07-29 ENCOUNTER — Encounter: Payer: Self-pay | Admitting: Pediatrics

## 2012-09-30 ENCOUNTER — Other Ambulatory Visit: Payer: Self-pay | Admitting: "Endocrinology

## 2012-09-30 DIAGNOSIS — E1065 Type 1 diabetes mellitus with hyperglycemia: Secondary | ICD-10-CM

## 2012-12-20 ENCOUNTER — Other Ambulatory Visit: Payer: Self-pay | Admitting: Neurosurgery

## 2012-12-20 DIAGNOSIS — M47817 Spondylosis without myelopathy or radiculopathy, lumbosacral region: Secondary | ICD-10-CM

## 2012-12-23 ENCOUNTER — Ambulatory Visit
Admission: RE | Admit: 2012-12-23 | Discharge: 2012-12-23 | Disposition: A | Discharge status: Home / Self Care | Payer: Medicare Other | Source: Ambulatory Visit | Attending: Neurosurgery | Admitting: Neurosurgery

## 2012-12-23 DIAGNOSIS — M47817 Spondylosis without myelopathy or radiculopathy, lumbosacral region: Secondary | ICD-10-CM

## 2012-12-23 MED ORDER — GADOBENATE DIMEGLUMINE 529 MG/ML IV SOLN
10.0000 mL | Freq: Once | INTRAVENOUS | Status: AC | PRN
  Administered 2012-12-23: 10 mL via INTRAVENOUS

## 2013-03-26 ENCOUNTER — Other Ambulatory Visit: Payer: Self-pay | Admitting: "Endocrinology

## 2013-08-28 ENCOUNTER — Other Ambulatory Visit: Payer: Self-pay | Admitting: Orthopedic Surgery

## 2013-08-28 DIAGNOSIS — M25519 Pain in unspecified shoulder: Secondary | ICD-10-CM

## 2013-09-06 ENCOUNTER — Ambulatory Visit
Admission: RE | Admit: 2013-09-06 | Discharge: 2013-09-06 | Disposition: A | Discharge status: Home / Self Care | Payer: Medicare Other | Source: Ambulatory Visit | Attending: Orthopedic Surgery | Admitting: Orthopedic Surgery

## 2013-09-06 DIAGNOSIS — M25519 Pain in unspecified shoulder: Secondary | ICD-10-CM

## 2014-05-03 ENCOUNTER — Emergency Department (HOSPITAL_COMMUNITY): Payer: Medicare Other

## 2014-05-03 ENCOUNTER — Encounter (HOSPITAL_COMMUNITY): Payer: Self-pay | Admitting: Radiology

## 2014-05-03 ENCOUNTER — Inpatient Hospital Stay (HOSPITAL_COMMUNITY)
Admission: EM | Admit: 2014-05-03 | Discharge: 2014-05-29 | DRG: 025 | Disposition: A | Discharge status: Rehabilitation Facility | Payer: Medicare Other | Attending: Neurosurgery | Admitting: Neurosurgery

## 2014-05-03 DIAGNOSIS — J449 Chronic obstructive pulmonary disease, unspecified: Secondary | ICD-10-CM | POA: Diagnosis present

## 2014-05-03 DIAGNOSIS — R29810 Facial weakness: Secondary | ICD-10-CM | POA: Diagnosis present

## 2014-05-03 DIAGNOSIS — E039 Hypothyroidism, unspecified: Secondary | ICD-10-CM | POA: Diagnosis present

## 2014-05-03 DIAGNOSIS — G629 Polyneuropathy, unspecified: Secondary | ICD-10-CM | POA: Insufficient documentation

## 2014-05-03 DIAGNOSIS — E669 Obesity, unspecified: Secondary | ICD-10-CM | POA: Diagnosis present

## 2014-05-03 DIAGNOSIS — M109 Gout, unspecified: Secondary | ICD-10-CM | POA: Diagnosis present

## 2014-05-03 DIAGNOSIS — E785 Hyperlipidemia, unspecified: Secondary | ICD-10-CM | POA: Insufficient documentation

## 2014-05-03 DIAGNOSIS — H518 Other specified disorders of binocular movement: Secondary | ICD-10-CM | POA: Diagnosis present

## 2014-05-03 DIAGNOSIS — F329 Major depressive disorder, single episode, unspecified: Secondary | ICD-10-CM | POA: Diagnosis present

## 2014-05-03 DIAGNOSIS — R1310 Dysphagia, unspecified: Secondary | ICD-10-CM | POA: Diagnosis not present

## 2014-05-03 DIAGNOSIS — E871 Hypo-osmolality and hyponatremia: Secondary | ICD-10-CM | POA: Insufficient documentation

## 2014-05-03 DIAGNOSIS — Z452 Encounter for adjustment and management of vascular access device: Secondary | ICD-10-CM

## 2014-05-03 DIAGNOSIS — E1140 Type 2 diabetes mellitus with diabetic neuropathy, unspecified: Secondary | ICD-10-CM | POA: Diagnosis present

## 2014-05-03 DIAGNOSIS — D320 Benign neoplasm of cerebral meninges: Secondary | ICD-10-CM | POA: Diagnosis present

## 2014-05-03 DIAGNOSIS — R339 Retention of urine, unspecified: Secondary | ICD-10-CM | POA: Diagnosis present

## 2014-05-03 DIAGNOSIS — G8194 Hemiplegia, unspecified affecting left nondominant side: Secondary | ICD-10-CM

## 2014-05-03 DIAGNOSIS — G4089 Other seizures: Secondary | ICD-10-CM | POA: Diagnosis present

## 2014-05-03 DIAGNOSIS — G936 Cerebral edema: Secondary | ICD-10-CM | POA: Diagnosis present

## 2014-05-03 DIAGNOSIS — E038 Other specified hypothyroidism: Secondary | ICD-10-CM | POA: Insufficient documentation

## 2014-05-03 DIAGNOSIS — Z905 Acquired absence of kidney: Secondary | ICD-10-CM | POA: Diagnosis present

## 2014-05-03 DIAGNOSIS — Z4659 Encounter for fitting and adjustment of other gastrointestinal appliance and device: Secondary | ICD-10-CM

## 2014-05-03 DIAGNOSIS — R4701 Aphasia: Secondary | ICD-10-CM | POA: Diagnosis present

## 2014-05-03 DIAGNOSIS — R930 Abnormal findings on diagnostic imaging of skull and head, not elsewhere classified: Secondary | ICD-10-CM | POA: Insufficient documentation

## 2014-05-03 DIAGNOSIS — N183 Chronic kidney disease, stage 3 (moderate): Secondary | ICD-10-CM | POA: Diagnosis present

## 2014-05-03 DIAGNOSIS — R471 Dysarthria and anarthria: Secondary | ICD-10-CM | POA: Diagnosis present

## 2014-05-03 DIAGNOSIS — M6289 Other specified disorders of muscle: Secondary | ICD-10-CM

## 2014-05-03 DIAGNOSIS — E1142 Type 2 diabetes mellitus with diabetic polyneuropathy: Secondary | ICD-10-CM | POA: Diagnosis not present

## 2014-05-03 DIAGNOSIS — G9340 Encephalopathy, unspecified: Secondary | ICD-10-CM | POA: Diagnosis present

## 2014-05-03 DIAGNOSIS — G9389 Other specified disorders of brain: Secondary | ICD-10-CM | POA: Diagnosis present

## 2014-05-03 DIAGNOSIS — R509 Fever, unspecified: Secondary | ICD-10-CM

## 2014-05-03 DIAGNOSIS — R338 Other retention of urine: Secondary | ICD-10-CM | POA: Insufficient documentation

## 2014-05-03 DIAGNOSIS — R001 Bradycardia, unspecified: Secondary | ICD-10-CM | POA: Diagnosis present

## 2014-05-03 DIAGNOSIS — J9600 Acute respiratory failure, unspecified whether with hypoxia or hypercapnia: Secondary | ICD-10-CM | POA: Diagnosis not present

## 2014-05-03 DIAGNOSIS — G8384 Todd's paralysis (postepileptic): Secondary | ICD-10-CM | POA: Diagnosis present

## 2014-05-03 DIAGNOSIS — D696 Thrombocytopenia, unspecified: Secondary | ICD-10-CM | POA: Diagnosis present

## 2014-05-03 DIAGNOSIS — I129 Hypertensive chronic kidney disease with stage 1 through stage 4 chronic kidney disease, or unspecified chronic kidney disease: Secondary | ICD-10-CM | POA: Diagnosis present

## 2014-05-03 DIAGNOSIS — D649 Anemia, unspecified: Secondary | ICD-10-CM | POA: Diagnosis present

## 2014-05-03 DIAGNOSIS — I10 Essential (primary) hypertension: Secondary | ICD-10-CM | POA: Diagnosis present

## 2014-05-03 DIAGNOSIS — M1000 Idiopathic gout, unspecified site: Secondary | ICD-10-CM | POA: Insufficient documentation

## 2014-05-03 DIAGNOSIS — Z6841 Body Mass Index (BMI) 40.0 and over, adult: Secondary | ICD-10-CM

## 2014-05-03 DIAGNOSIS — R1115 Cyclical vomiting syndrome unrelated to migraine: Secondary | ICD-10-CM

## 2014-05-03 DIAGNOSIS — T884XXA Failed or difficult intubation, initial encounter: Secondary | ICD-10-CM

## 2014-05-03 DIAGNOSIS — G939 Disorder of brain, unspecified: Secondary | ICD-10-CM

## 2014-05-03 DIAGNOSIS — J438 Other emphysema: Secondary | ICD-10-CM | POA: Insufficient documentation

## 2014-05-03 DIAGNOSIS — E1165 Type 2 diabetes mellitus with hyperglycemia: Secondary | ICD-10-CM | POA: Diagnosis present

## 2014-05-03 DIAGNOSIS — J9690 Respiratory failure, unspecified, unspecified whether with hypoxia or hypercapnia: Secondary | ICD-10-CM

## 2014-05-03 DIAGNOSIS — K5900 Constipation, unspecified: Secondary | ICD-10-CM

## 2014-05-03 DIAGNOSIS — Z931 Gastrostomy status: Secondary | ICD-10-CM

## 2014-05-03 DIAGNOSIS — IMO0002 Reserved for concepts with insufficient information to code with codable children: Secondary | ICD-10-CM | POA: Diagnosis present

## 2014-05-03 DIAGNOSIS — G8190 Hemiplegia, unspecified affecting unspecified side: Secondary | ICD-10-CM

## 2014-05-03 DIAGNOSIS — Z853 Personal history of malignant neoplasm of breast: Secondary | ICD-10-CM | POA: Diagnosis not present

## 2014-05-03 DIAGNOSIS — K219 Gastro-esophageal reflux disease without esophagitis: Secondary | ICD-10-CM | POA: Diagnosis present

## 2014-05-03 DIAGNOSIS — R531 Weakness: Secondary | ICD-10-CM | POA: Diagnosis present

## 2014-05-03 DIAGNOSIS — D329 Benign neoplasm of meninges, unspecified: Secondary | ICD-10-CM

## 2014-05-03 LAB — URINALYSIS, ROUTINE W REFLEX MICROSCOPIC
Bilirubin Urine: NEGATIVE
Glucose, UA: >1000 mg/dL — AB
Ketones, ur: NEGATIVE mg/dL
Leukocytes, UA: NEGATIVE
Nitrite: NEGATIVE
Protein, ur: >300 mg/dL — AB
Specific Gravity, Urine: 1.027 (ref 1.005–1.030)
Urobilinogen, UA: 0.2 mg/dL (ref 0.0–1.0)
pH: 5 (ref 5.0–8.0)

## 2014-05-03 LAB — I-STAT TROPONIN, ED: Troponin i, poc: 0 ng/mL (ref 0.00–0.08)

## 2014-05-03 LAB — CBC
HCT: 36.1 % (ref 36.0–46.0)
Hemoglobin: 12.2 g/dL (ref 12.0–15.0)
MCH: 30 pg (ref 26.0–34.0)
MCHC: 33.8 g/dL (ref 30.0–36.0)
MCV: 88.7 fL (ref 78.0–100.0)
Platelets: 171 10*3/uL (ref 150–400)
RBC: 4.07 MIL/uL (ref 3.87–5.11)
RDW: 12.7 % (ref 11.5–15.5)
WBC: 6.7 10*3/uL (ref 4.0–10.5)

## 2014-05-03 LAB — COMPREHENSIVE METABOLIC PANEL
ALT: 54 U/L — ABNORMAL HIGH (ref 0–35)
AST: 67 U/L — ABNORMAL HIGH (ref 0–37)
Albumin: 3.2 g/dL — ABNORMAL LOW (ref 3.5–5.2)
Alkaline Phosphatase: 165 U/L — ABNORMAL HIGH (ref 39–117)
Anion gap: 12 (ref 5–15)
BUN: 24 mg/dL — ABNORMAL HIGH (ref 6–23)
CO2: 22 mmol/L (ref 19–32)
Calcium: 8.9 mg/dL (ref 8.4–10.5)
Chloride: 96 mmol/L (ref 96–112)
Creatinine, Ser: 1.38 mg/dL — ABNORMAL HIGH (ref 0.50–1.10)
GFR calc Af Amer: 43 mL/min — ABNORMAL LOW (ref >90)
GFR calc non Af Amer: 37 mL/min — ABNORMAL LOW (ref >90)
Glucose, Bld: 551 mg/dL (ref 70–99)
Potassium: 4.2 mmol/L (ref 3.5–5.1)
Sodium: 130 mmol/L — ABNORMAL LOW (ref 135–145)
Total Bilirubin: 0.7 mg/dL (ref 0.3–1.2)
Total Protein: 7.3 g/dL (ref 6.0–8.3)

## 2014-05-03 LAB — I-STAT CHEM 8, ED
BUN: 26 mg/dL — ABNORMAL HIGH (ref 6–23)
Calcium, Ion: 1.19 mmol/L (ref 1.13–1.30)
Chloride: 97 mmol/L (ref 96–112)
Creatinine, Ser: 1.1 mg/dL (ref 0.50–1.10)
Glucose, Bld: 560 mg/dL (ref 70–99)
HCT: 39 % (ref 36.0–46.0)
Hemoglobin: 13.3 g/dL (ref 12.0–15.0)
Potassium: 4.2 mmol/L (ref 3.5–5.1)
Sodium: 131 mmol/L — ABNORMAL LOW (ref 135–145)
TCO2: 20 mmol/L (ref 0–100)

## 2014-05-03 LAB — URINE MICROSCOPIC-ADD ON

## 2014-05-03 LAB — I-STAT CG4 LACTIC ACID, ED: Lactic Acid, Venous: 2.12 mmol/L (ref 0.5–2.0)

## 2014-05-03 LAB — APTT: aPTT: 29 seconds (ref 24–37)

## 2014-05-03 LAB — DIFFERENTIAL
Basophils Absolute: 0 10*3/uL (ref 0.0–0.1)
Basophils Relative: 0 % (ref 0–1)
Eosinophils Absolute: 0.2 10*3/uL (ref 0.0–0.7)
Eosinophils Relative: 3 % (ref 0–5)
Lymphocytes Relative: 34 % (ref 12–46)
Lymphs Abs: 2.3 10*3/uL (ref 0.7–4.0)
Monocytes Absolute: 0.5 10*3/uL (ref 0.1–1.0)
Monocytes Relative: 7 % (ref 3–12)
Neutro Abs: 3.7 10*3/uL (ref 1.7–7.7)
Neutrophils Relative %: 56 % (ref 43–77)

## 2014-05-03 LAB — PROTIME-INR
INR: 1.29 (ref 0.00–1.49)
Prothrombin Time: 16.3 seconds — ABNORMAL HIGH (ref 11.6–15.2)

## 2014-05-03 LAB — CBG MONITORING, ED
Glucose-Capillary: 495 mg/dL — ABNORMAL HIGH (ref 70–99)
Glucose-Capillary: 441 mg/dL — ABNORMAL HIGH (ref 70–99)
Glucose-Capillary: 417 mg/dL — ABNORMAL HIGH (ref 70–99)

## 2014-05-03 MED ORDER — INSULIN REGULAR BOLUS VIA INFUSION
0.0000 [IU] | Freq: Three times a day (TID) | INTRAVENOUS | Status: DC
  Filled 2014-05-03: qty 10

## 2014-05-03 MED ORDER — HALOPERIDOL LACTATE 5 MG/ML IJ SOLN
2.0000 mg | Freq: Once | INTRAMUSCULAR | Status: DC
  Filled 2014-05-03: qty 1

## 2014-05-03 MED ORDER — SODIUM CHLORIDE 0.9 % IV SOLN: INTRAVENOUS | Status: DC

## 2014-05-03 MED ORDER — LABETALOL HCL 5 MG/ML IV SOLN: 40.0000 mg | Freq: Once | INTRAVENOUS | Status: DC

## 2014-05-03 MED ORDER — SODIUM CHLORIDE 0.9 % IV SOLN
INTRAVENOUS | Status: DC
  Administered 2014-05-03: 3.8 [IU]/h via INTRAVENOUS
  Administered 2014-05-04: 14.1 [IU]/h via INTRAVENOUS
  Administered 2014-05-04: 12.6 [IU]/h via INTRAVENOUS
  Administered 2014-05-04: 6.3 [IU]/h via INTRAVENOUS
  Administered 2014-05-04: 8.6 [IU]/h via INTRAVENOUS
  Administered 2014-05-04: 11.4 [IU]/h via INTRAVENOUS
  Administered 2014-05-04: 11.5 [IU]/h via INTRAVENOUS
  Administered 2014-05-04: 10.8 [IU]/h via INTRAVENOUS
  Filled 2014-05-03: qty 2.5

## 2014-05-03 MED ORDER — DEXTROSE 50 % IV SOLN: 25.0000 mL | INTRAVENOUS | Status: DC | PRN

## 2014-05-03 MED ORDER — DEXAMETHASONE SODIUM PHOSPHATE 10 MG/ML IJ SOLN
10.0000 mg | Freq: Once | INTRAMUSCULAR | Status: AC
  Administered 2014-05-03: 10 mg via INTRAVENOUS
  Filled 2014-05-03: qty 1

## 2014-05-03 MED ORDER — DEXTROSE-NACL 5-0.45 % IV SOLN
INTRAVENOUS | Status: DC
  Administered 2014-05-04: 50 mL/h via INTRAVENOUS

## 2014-05-03 MED ORDER — ACETAMINOPHEN 650 MG RE SUPP
650.0000 mg | Freq: Once | RECTAL | Status: AC
  Administered 2014-05-03: 650 mg via RECTAL
  Filled 2014-05-03: qty 1

## 2014-05-03 MED ORDER — LABETALOL HCL 5 MG/ML IV SOLN
20.0000 mg | Freq: Once | INTRAVENOUS | Status: AC
  Administered 2014-05-03: 20 mg via INTRAVENOUS
  Filled 2014-05-03: qty 4

## 2014-05-03 MED ORDER — MOMETASONE FURO-FORMOTEROL FUM 100-5 MCG/ACT IN AERO
2.0000 | INHALATION_SPRAY | Freq: Two times a day (BID) | RESPIRATORY_TRACT | Status: DC
  Administered 2014-05-04 – 2014-05-08 (×7): 2 via RESPIRATORY_TRACT
  Filled 2014-05-03: qty 8.8

## 2014-05-03 MED ORDER — LABETALOL HCL 5 MG/ML IV SOLN
5.0000 mg | INTRAVENOUS | Status: DC | PRN
  Administered 2014-05-04 – 2014-05-05 (×3): 10 mg via INTRAVENOUS
  Filled 2014-05-03 (×3): qty 4

## 2014-05-03 MED ORDER — SODIUM CHLORIDE 0.9 % IV BOLUS (SEPSIS)
1000.0000 mL | Freq: Once | INTRAVENOUS | Status: AC
  Administered 2014-05-03: 1000 mL via INTRAVENOUS

## 2014-05-03 MED ORDER — DEXAMETHASONE SODIUM PHOSPHATE 4 MG/ML IJ SOLN
4.0000 mg | Freq: Four times a day (QID) | INTRAMUSCULAR | Status: DC
  Administered 2014-05-04 – 2014-05-08 (×17): 4 mg via INTRAVENOUS
  Administered 2014-05-08: 10 mg via INTRAVENOUS
  Administered 2014-05-08 – 2014-05-11 (×12): 4 mg via INTRAVENOUS
  Filled 2014-05-03 (×37): qty 1

## 2014-05-03 NOTE — ED Notes (Signed)
MD at bedside. 

## 2014-05-03 NOTE — ED Notes (Signed)
Pt last seen normal tonight at 1800, pt presents to the department with left sided facial droop, left arm weakness, left leg weakness. Pt's head deviated left. Pt's daughter with her upon arrival to department. Neuro and EDP at [facility] upon arrival. Hx of HTN.

## 2014-05-03 NOTE — ED Notes (Signed)
Pt's symptoms are intermittent, pt is able to squeeze this RN's hand with her left hand at times and at other times. Pt is aphasic intermittently as well, with the aphasia lasting longer than the period where she is able to verbally communicate. Pt is not able to turn her head to the right, whereas pt's head was deviated and locked left.

## 2014-05-03 NOTE — Consult Note (Addendum)
Referring Physician: ED    Chief Complaint: left hemiparesis, left face weakness, left gaze preference, dysarthria, confusion  HPI:                                                                                                                                         Morgan [name] is an 74 y.o. female, right handed, with a past medical history significant for HTN, DM type 2, left breast cancer on remission for 5 years, kidney cancer s/p nephrectomy,  obesity, COPD, brought in as a code stroke via EMS due to acute onset of the above stated symptoms. She was last seen normal at 1800 today by her son who noted confusion, left sided weakness, gaze to the left, and confusion and therefore EMS was summoned. Initial NIHSS 11. CT brain reviewed by myself showed evidence of an enlarging poorly characterized mass at the high right frontoparietal region, measuring perhaps 5.4 x 3.3 cm, with vague central calcification and new area of significant underlying vasogenic edema. CBG>500 and febrile with normal white count. Presently, she is awake and alert and is ab le to follow simple commands.  Date last known well: 05/03/14 Time last known well: 1800 tPA Given: no, neurological syndrome and CT brain more compatible with brain mass NIHSS: 11   Past Medical History  Diagnosis Date  . Diabetes mellitus   . Obesity   . Diabetes type 2, uncontrolled   . Hypoglycemia associated with diabetes   . Edema of both legs   . Hypertension   . Combined hyperlipidemia   . Acquired autoimmune hypothyroidism   . Thyroiditis, autoimmune   . Abnormal liver function tests   . Sleep apnea, obstructive   . History of gastroesophageal reflux (GERD)   . Depression   . DM neuropathy with neurologic complication   . COPD with acute exacerbation   . Goiter   . Fatigue   . Vertigo   . Type II diabetes mellitus with peripheral angiopathy   . Gout   . Pallor     Past Surgical History  Procedure Laterality Date  .  Laparoscopic gastric banding    . Back surgery      Family History  Problem Relation Age of Onset  . Diabetes Father   . Diabetes Sister   . Diabetes Brother   Family history: no epilepsy, brain tumors, or brain aneurysm Social History:  reports that she has never smoked. She does not have any smokeless tobacco history on file. She reports that she does not drink alcohol or use illicit drugs.  Allergies:  Allergies  Allergen Reactions  . Latex Rash  . Penicillins Rash    Medications:  I have reviewed the patient's current medications.  ROS: unable to obtain due to mental status                                                                                                History obtained from chart review and family  Physical exam: pleasant female in no apparent distress. Blood pressure 155/71, pulse 100, resp. rate 26, height 5\' 4"  (1.626 m), SpO2 81 %. Head: normocephalic. Neck: supple, no bruits, no JVD. Cardiac: no murmurs. Lungs: clear. Abdomen: soft, no tender, no mass. Extremities: no edema. Skin: no rash Neurologic Examination:                                                                                                      General: Mental Status: Alert, oriented to place-year-month, thought.  Speech fluent without evidence of aphasia.  Able to follow simple step commands without difficulty. Cranial Nerves: II: Discs flat bilaterally; Visual fields grossly normal, pupils equal, round, reactive to light and accommodation III,IV, VI: ptosis not present, left gaze preference V,VII: smile symmetric, facial light touch sensation normal bilaterally VIII: hearing normal bilaterally IX,X: gag reflex present XI: bilateral shoulder shrug no tested XII: midline tongue extension without atrophy or fasciculations Motor: Significant for left HP,  leg>arm Tone and bulk:normal tone throughout; no atrophy noted Sensory: Pinprick and light touch impaired in the left Deep Tendon Reflexes:  1+ all over Plantars: Right: downgoing   Left: downgoing Cerebellar: normal finger-to-nose in the right but impaired in the left due to weaknessa, can not perform heel-to-shin test in the left due to weakness  Gait:  Unable to test CV: pulses palpable throughout    Results for orders placed or performed during the hospital encounter of 05/03/14 (from the past 48 hour(s))  CBC     Status: None   Collection Time: 05/03/14  7:47 PM  Result Value Ref Range   WBC 6.7 4.0 - 10.5 K/uL   RBC 4.07 3.87 - 5.11 MIL/uL   Hemoglobin 12.2 12.0 - 15.0 g/dL   HCT 36.1 36.0 - 46.0 %   MCV 88.7 78.0 - 100.0 fL   MCH 30.0 26.0 - 34.0 pg   MCHC 33.8 30.0 - 36.0 g/dL   RDW 12.7 11.5 - 15.5 %   Platelets 171 150 - 400 K/uL  Differential     Status: None   Collection Time: 05/03/14  7:47 PM  Result Value Ref Range   Neutrophils Relative % 56 43 - 77 %   Neutro Abs 3.7 1.7 - 7.7 K/uL   Lymphocytes Relative 34 12 - 46 %   Lymphs Abs 2.3 0.7 - 4.0 K/uL   Monocytes Relative  7 3 - 12 %   Monocytes Absolute 0.5 0.1 - 1.0 K/uL   Eosinophils Relative 3 0 - 5 %   Eosinophils Absolute 0.2 0.0 - 0.7 K/uL   Basophils Relative 0 0 - 1 %   Basophils Absolute 0.0 0.0 - 0.1 K/uL  I-Stat Chem 8, ED     Status: Abnormal   Collection Time: 05/03/14  7:59 PM  Result Value Ref Range   Sodium 131 (L) 135 - 145 mmol/L   Potassium 4.2 3.5 - 5.1 mmol/L   Chloride 97 96 - 112 mmol/L   BUN 26 (H) 6 - 23 mg/dL   Creatinine, Ser 1.10 0.50 - 1.10 mg/dL   Glucose, Bld 560 (HH) 70 - 99 mg/dL   Calcium, Ion 1.19 1.13 - 1.30 mmol/L   TCO2 20 0 - 100 mmol/L   Hemoglobin 13.3 12.0 - 15.0 g/dL   HCT 39.0 36.0 - 46.0 %   Comment NOTIFIED PHYSICIAN    No results found.   Assessment: 74 y.o. female with history of HTN, DM, breast cancer on remission for 5 years, renal cancer s/p  nephrectomy, brought in as a code stroke due to acute onset confusion, left gaze preference, left hemiparesis, dysarthria. NIHSS 11. CT brain with an enlarging, vaguely calcified, poorly defined mass with vasogenic edema involving the right fronto-parietal region.  Although this mass was previously seen on CT brain 2012, it is definitely enlarging and has developed new vasogenic edema. I can not conclusively say that she hasn't had an acute right brain infarct but the  findings on CT brain  in conjunction with the fact that she has non concordant left gaze preference with ipsilateral left hemiparesis on exam makes me uncomfortable about treating patient with thrombolysis. Will obtain STAT MRI brain for further clarification of the CT findings.  Daughter at the bedside was updated regarding diagnostic possibilities and plan of action. Admit to medicine. Neurology will follow up.     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 Patient with transient episodes of decreased responsiveness, inability to talk, and worsening left sided weakness. I am concerned she could be having structural focal seizures with impairment of consciousness and thus suggested loading with 1 gram IV keppra and continuing 500 mg BID thereafter. EEG in am. Need neurosurgery consultation. Add decadron 4 mg IV q 6 hours. Will follow up.  Dorian Pod, MD

## 2014-05-03 NOTE — ED Notes (Signed)
Pt transported to MRI with Villa Herb, RN. RN will stay with pt in MRI for continuous monitoring.

## 2014-05-03 NOTE — ED Notes (Signed)
Per Threasa Beards, RN, pt is having a hard time staying still in MRI. See new orders.

## 2014-05-03 NOTE — H&P (Signed)
[facility] History and Physical  Morgan [name] TJQ:[id number] DOB: 1940/05/05 DOA: 05/03/2014  Referring physician: EDP PCP: Gilford Rile, MD   Chief Complaint: Altered mental status   HPI: Morgan [name] is a 74 y.o. female who is brought in by family today after developing sudden onset of L sided weakness, difficulty with speech, gaze to the left.  Symptoms have been worsening since arrival to the ED but are now fluctuating rapidly, between 5/5 strength on the left and total paralysis on the left but obeying commands with the right.  Review of Systems: unable to perform due to AMS.  Past Medical History  Diagnosis Date  . Diabetes mellitus   . Obesity   . Diabetes type 2, uncontrolled   . Hypoglycemia associated with diabetes   . Edema of both legs   . Hypertension   . Combined hyperlipidemia   . Acquired autoimmune hypothyroidism   . Thyroiditis, autoimmune   . Abnormal liver function tests   . Sleep apnea, obstructive   . History of gastroesophageal reflux (GERD)   . Depression   . DM neuropathy with neurologic complication   . COPD with acute exacerbation   . Goiter   . Fatigue   . Vertigo   . Type II diabetes mellitus with peripheral angiopathy   . Gout   . Pallor    Past Surgical History  Procedure Laterality Date  . Laparoscopic gastric banding    . Back surgery     Social History:  reports that she has never smoked. She does not have any smokeless tobacco history on file. She reports that she does not drink alcohol or use illicit drugs.  Allergies  Allergen Reactions  . Latex Rash  . Sulfa Antibiotics Other (See Comments)    Unknown allergic reaction  . Penicillins Rash    Family History  Problem Relation Age of Onset  . Diabetes Father   . Diabetes Sister   . Diabetes Brother      Prior to Admission medications   Medication Sig Start Date End Date Taking? Authorizing Provider  acetaminophen (TYLENOL) 500 MG tablet Take 1,000 mg by mouth  every 6 (six) hours as needed (pain).   Yes Historical Provider, MD  albuterol (PROVENTIL,VENTOLIN) 90 MCG/ACT inhaler Inhale 2 puffs into the lungs 4 (four) times daily as needed for wheezing or shortness of breath.    Yes Historical Provider, MD  anastrozole (ARIMIDEX) 1 MG tablet Take 1 mg by mouth daily.     Yes Historical Provider, MD  aspirin 81 MG chewable tablet Chew 81 mg by mouth daily.     Yes Historical Provider, MD  Calcium Carbonate-Vitamin D (CALTRATE 600+D PO) Take 1 tablet by mouth 2 (two) times daily.    Yes Historical Provider, MD  cloNIDine (CATAPRES) 0.3 MG tablet Take 0.3 mg by mouth 2 (two) times daily.     Yes Historical Provider, MD  colchicine (COLCRYS) 0.6 MG tablet Take 0.6 mg by mouth 2 (two) times daily as needed (gout).    Yes Historical Provider, MD  diclofenac sodium (VOLTAREN) 1 % GEL Apply 1 application topically 4 (four) times daily.   Yes Historical Provider, MD  DULoxetine (CYMBALTA) 60 MG capsule Take 60 mg by mouth 2 (two) times daily.    Yes Historical Provider, MD  febuxostat (ULORIC) 40 MG tablet Take 40 mg by mouth daily.    Yes Historical Provider, MD  felodipine (PLENDIL) 2.5 MG 24 hr tablet Take 2.5 mg by mouth daily.  Yes Historical Provider, MD  fexofenadine (ALLEGRA) 180 MG tablet Take 180 mg by mouth daily as needed for allergies or rhinitis.   Yes Historical Provider, MD  Fluticasone-Salmeterol (ADVAIR) 250-50 MCG/DOSE AEPB Inhale 1 puff into the lungs every 12 (twelve) hours.     Yes Historical Provider, MD  furosemide (LASIX) 40 MG tablet Take 40 mg by mouth 2 (two) times daily.     Yes Historical Provider, MD  gabapentin (NEURONTIN) 100 MG capsule Take 100 mg by mouth daily.    Yes Historical Provider, MD  Glucosamine HCl (GLUCOSAMINE PO) Take 1 tablet by mouth daily.   Yes Historical Provider, MD  HYDROcodone-acetaminophen (NORCO/VICODIN) 5-325 MG per tablet Take 1 tablet by mouth every 6 (six) hours as needed for moderate pain.   Yes  Historical Provider, MD  ibuprofen (ADVIL,MOTRIN) 200 MG tablet Take 200 mg by mouth every 6 (six) hours as needed (pain).   Yes Historical Provider, MD  insulin regular human CONCENTRATED (HUMULIN R) 500 UNIT/ML SOLN injection Inject 4-18 Units into the skin 3 (three) times daily with meals. CBG less than 70, treat the low blood sugar and recheck in 15 min. 70-90 4-6 units, 91-130 6-10 units, 131-150 7-11 units, 151-200 8-12 units, 201-250 9-13 units, 251-300 10-14 units, 301-350 11-15 units, 351-400 12-16 units, 401-450 13-17 units >450 14-18 units   Yes Historical Provider, MD  levothyroxine (SYNTHROID, LEVOTHROID) 200 MCG tablet Take 200-300 mcg by mouth. Brand Name Synthroid Only. Take 1 1/2 tablets (300 mg) on Sundays, take 1 tablet (200 mg) Monday thru Saturday 12/14/11  Yes Sherrlyn Hock, MD  Liraglutide 18 MG/3ML SOPN Inject 1.8 mg into the skin daily. Victoza   Yes Historical Provider, MD  lisinopril (PRINIVIL,ZESTRIL) 40 MG tablet Take 40 mg by mouth daily.  04/18/14  Yes Historical Provider, MD  LORazepam (ATIVAN) 0.5 MG tablet Take 0.5 mg by mouth at bedtime.    Yes Historical Provider, MD  metoprolol succinate (TOPROL-XL) 100 MG 24 hr tablet Take 100 mg by mouth daily. Take with or immediately following a meal.   Yes Historical Provider, MD  Multiple Vitamin (MULTIVITAMIN WITH MINERALS) TABS tablet Take 1 tablet by mouth daily. Centrum Chewable   Yes Historical Provider, MD  Multiple Vitamin (MULTIVITAMIN WITH MINERALS) TABS tablet Take 1 tablet by mouth daily. One A Day with Iron   Yes Historical Provider, MD  nitroGLYCERIN (NITROSTAT) 0.4 MG SL tablet Place 0.4 mg under the tongue every 5 (five) minutes as needed for chest pain.    Yes Historical Provider, MD  omeprazole (PRILOSEC OTC) 20 MG tablet Take 20 mg by mouth 2 (two) times daily.   Yes Historical Provider, MD  senna (SENOKOT) 8.6 MG TABS tablet Take 1 tablet by mouth daily as needed for mild constipation.   Yes Historical  Provider, MD  simvastatin (ZOCOR) 40 MG tablet Take 40 mg by mouth at bedtime.     Yes Historical Provider, MD  traMADol (ULTRAM) 50 MG tablet Take 50 mg by mouth 3 (three) times daily as needed (pain).   Yes Historical Provider, MD  vitamin B-12 (CYANOCOBALAMIN) 1000 MCG tablet Take 1,000 mcg by mouth daily.   Yes Historical Provider, MD  ACCU-CHEK SMARTVIEW test strip USE TO TEST BLOOD SUGAR 6 TO 8 TIMES A DAY 09/30/12   Sherrlyn Hock, MD   Physical Exam: Filed Vitals:   05/03/14 2215  BP: 169/62  Pulse: 81  Temp:   Resp: 14    BP 169/62 mmHg  Pulse 81  Temp(Src) 99.8 F (37.7 C) (Rectal)  Resp 14  Ht 5' 4"  (1.626 m)  Wt 104.327 kg (230 lb)  BMI 39.46 kg/m2  SpO2 94%  General Appearance:    Alert, oriented initially, now unable to answer questions, no distress, appears stated age  Head:    Normocephalic, atraumatic  Eyes:    PERRL, left gaze preference, sclera non-icteric        Nose:   Nares without drainage or epistaxis. Mucosa, turbinates normal  Throat:   Moist mucous membranes. Oropharynx without erythema or exudate.  Neck:   Supple. No carotid bruits.  No thyromegaly.  No lymphadenopathy.   Back:     No CVA tenderness, no spinal tenderness  Lungs:     Clear to auscultation bilaterally, without wheezes, rhonchi or rales  Chest wall:    No tenderness to palpitation  Heart:    Regular rate and rhythm without murmurs, gallops, rubs  Abdomen:     Soft, non-tender, nondistended, normal bowel sounds, no organomegaly  Genitalia:    deferred  Rectal:    deferred  Extremities:   No clubbing, cyanosis or edema.  Pulses:   2+ and symmetric all extremities  Skin:   Skin color, texture, turgor normal, no rashes or lesions  Lymph nodes:   Cervical, supraclavicular, and axillary nodes normal  Neurologic:   Alternates between 0/5 strength in the LUE to 5/5 strength in the LUE.  Has left facial droop.  LLE weakness as well.  Aphasia.    Labs on Admission:  Basic Metabolic  Panel:  Recent Labs Lab 05/03/14 1947 05/03/14 1959  NA 130* 131*  K 4.2 4.2  CL 96 97  CO2 22  --   GLUCOSE 551* 560*  BUN 24* 26*  CREATININE 1.38* 1.10  CALCIUM 8.9  --    Liver Function Tests:  Recent Labs Lab 05/03/14 1947  AST 67*  ALT 54*  ALKPHOS 165*  BILITOT 0.7  PROT 7.3  ALBUMIN 3.2*   No results for input(s): LIPASE, AMYLASE in the last 168 hours. No results for input(s): AMMONIA in the last 168 hours. CBC:  Recent Labs Lab 05/03/14 1947 05/03/14 1959  WBC 6.7  --   NEUTROABS 3.7  --   HGB 12.2 13.3  HCT 36.1 39.0  MCV 88.7  --   PLT 171  --    Cardiac Enzymes: No results for input(s): CKTOTAL, CKMB, CKMBINDEX, TROPONINI in the last 168 hours.  BNP (last 3 results) No results for input(s): PROBNP in the last 8760 hours. CBG:  Recent Labs Lab 05/03/14 2001 05/03/14 2107 05/03/14 2217  GLUCAP 495* 441* 417*    Radiological Exams on Admission: Ct Head (brain) Wo Contrast  05/03/2014   CLINICAL DATA:  Code stroke. Acute onset of left-sided facial droop, left arm and left leg weakness, and leftward head deviation. Initial encounter.  EXAM: CT HEAD WITHOUT CONTRAST  TECHNIQUE: Contiguous axial images were obtained from the base of the skull through the vertex without intravenous contrast.  COMPARISON:  CT of the head performed 06/03/2010  FINDINGS: There is an enlarging poorly characterized mass at the high right frontoparietal region, measuring perhaps 5.4 x 3.3 cm, with vague central calcification and underlying vasogenic edema. Given that this was present in 2012, it is compatible with an enlarging meningioma. The vasogenic edema is new from the prior study, but should not explain the patient's acute CVA symptoms. Approximately 6 mm of leftward midline shift is seen, without  evidence of subfalcine herniation.  There is no evidence of or extra-axial hemorrhage. No definite acute infarct is identified. Mild periventricular white matter change likely  reflects small vessel ischemic microangiopathy. Minimally decreased attenuation within the left cerebral peduncle is likely artifactual in nature.  The posterior fossa, including the cerebellum, brainstem and fourth ventricle, is within normal limits. The third and lateral ventricles, and basal ganglia are unremarkable in appearance.  There is no evidence of fracture; visualized osseous structures are unremarkable in appearance. The orbits are within normal limits. The paranasal sinuses and mastoid air cells are well-aerated. No significant soft tissue abnormalities are seen.  IMPRESSION: 1. No acute intracranial pathology seen on CT to explain the patient's acute symptoms. 2. Enlarging meningioma noted at the high right frontoparietal region, measuring perhaps 5.4 x 3.3 cm, with vague central calcification and underlying vasogenic edema. This has gradually enlarged since 2012, with new vasogenic edema. Approximately 6 mm of leftward midline shift is seen. However, this should not explain the patient's acute CVA symptoms, and should not affect the stroke plan for treatment, as deemed clinically appropriate. MRI is recommended for further evaluation. 3. Mild small vessel ischemic microangiopathy.  These results were called by telephone at the time of interpretation on 05/03/2014 at 7:59 pm to Dr. Virgel Manifold, who verbally acknowledged these results.   Electronically Signed   By: Garald Balding M.D.   On: 05/03/2014 20:05   Dg Chest Portable 1 View  05/03/2014   CLINICAL DATA:  Acute onset of left-sided facial droop, left arm weakness and left leg weakness. Initial encounter.  EXAM: PORTABLE CHEST - 1 VIEW  COMPARISON:  Chest radiograph performed 08/27/2012  FINDINGS: The lungs are hypoexpanded. Left basilar airspace opacity may reflect atelectasis or possibly pneumonia. Mild vascular crowding is noted. No pleural effusion or pneumothorax is seen.  The cardiomediastinal silhouette is mildly enlarged. No acute  osseous abnormalities are seen. Cervical spinal fusion hardware is partially imaged.  IMPRESSION: Lungs hypoexpanded. Left basilar airspace opacity may reflect atelectasis or possibly pneumonia. Mild cardiomegaly noted.   Electronically Signed   By: Garald Balding M.D.   On: 05/03/2014 20:40    EKG: Independently reviewed.  Assessment/Plan Active Problems:   Diabetes type 2, uncontrolled   Brain mass   Acute encephalopathy   Left-sided weakness   Aphasia   1. Acute encephalopathy - CT scan reveals brain mass / meningioma that has enlarged since 2012 and now has vasogenic edema about it as well as midline shift all of which is new since 2012.  MRI brain is pending to rule out stroke.  There is now high suspicion given the fluxuating neurologic exam that the patient may be having some sort of complex focal seizures associated with the brain mass, neurology has been updated and re-evaluated patient since neuro exam has changed while patient still in ED. 1. MRI brain being done emergently now 2. If no stroke, then start patient on Keppra per neurology for suspected seizures 3. Decadron 3m load in ED then 460mQ6H 4. NPO 5. Tele and pulse ox continuous, is at risk of failing to guard airway in which case will need emergent intubation 6. Likely needs neurosurgical consultation assuming she hasnt had a massive stroke and her neurologic status can be recovered. 2. DM2 - BGLs initially in the 500s, patient on insulin gtt, will have to hold all home meds as she is NPO 3. HTN - putting patient on PRN labetalol as her BP was as high as  136C systolic in the ED, came down with IV labetalol to 160.  Holding home meds due to NPO status    Code Status: Full Code  Family Communication: Family at bedside Disposition Plan: Admit to SDU   Time spent: 70 min  Adreona Brand M. [facility] Pager [phone]  If 7AM-7PM, please contact the day team taking care of the patient [email or web address] Password  [facility] 05/03/2014, 10:53 PM

## 2014-05-03 NOTE — ED Notes (Signed)
MRI called to inquire about when pt can be transported.

## 2014-05-03 NOTE — ED Notes (Signed)
This RN with patient at MRI. Pt stable, NAD, and connected to heart monitor, pulse ox and BP cuff.

## 2014-05-03 NOTE — ED Notes (Signed)
CG-4 reported to Dr. Wilson Singer

## 2014-05-03 NOTE — ED Notes (Signed)
MD informed of pt's BP

## 2014-05-03 NOTE — ED Notes (Signed)
Pt in CT with this RN, RR RN, and Neurologist.

## 2014-05-03 NOTE — Progress Notes (Signed)
Responded to Code Stroke brought in by [facility]. LSW 1800, symptoms included left sided weakness, left gaze and left facial droop. CT head showed a brain mass with surrounding edema. NIH 12, TPA contraindicated, Code Stoke canceled per [employer].

## 2014-05-03 NOTE — ED Provider Notes (Signed)
CSN: [id number]     Arrival date & time 05/03/14  1941 History   First MD Initiated Contact with Patient 05/03/14 1944     Chief Complaint  Patient presents with  . Code Stroke     (Consider location/radiation/quality/duration/timing/severity/associated sxs/prior Treatment) HPI   73yF with L sided weakness/gaze and confusion.. Noticed by family. Last seen normal ~1800. Made code stroke pre-hospital. Evaluated by neurology on arrival and went to CT. Unfortunately imaging showing enlarging meningioma with vasogenic edema. Also noted to be significant hyperglycemic.    Past Medical History  Diagnosis Date  . Diabetes mellitus   . Obesity   . Diabetes type 2, uncontrolled   . Hypoglycemia associated with diabetes   . Edema of both legs   . Hypertension   . Combined hyperlipidemia   . Acquired autoimmune hypothyroidism   . Thyroiditis, autoimmune   . Abnormal liver function tests   . Sleep apnea, obstructive   . History of gastroesophageal reflux (GERD)   . Depression   . DM neuropathy with neurologic complication   . COPD with acute exacerbation   . Goiter   . Fatigue   . Vertigo   . Type II diabetes mellitus with peripheral angiopathy   . Gout   . Pallor    Past Surgical History  Procedure Laterality Date  . Laparoscopic gastric banding    . Back surgery     Family History  Problem Relation Age of Onset  . Diabetes Father   . Diabetes Sister   . Diabetes Brother    History  Substance Use Topics  . Smoking status: Never Smoker   . Smokeless tobacco: Not on file  . Alcohol Use: No   OB History    No data available     Review of Systems  All systems reviewed and negative, other than as noted in HPI.   Allergies  Latex and Penicillins  Home Medications   Prior to Admission medications   Medication Sig Start Date End Date Taking? Authorizing Provider  ACCU-CHEK SMARTVIEW test strip USE TO TEST BLOOD SUGAR 6 TO 8 TIMES A DAY 09/30/12   Sherrlyn Hock,  MD  albuterol (PROVENTIL,VENTOLIN) 90 MCG/ACT inhaler Inhale 2 puffs into the lungs every 6 (six) hours as needed.      Historical Provider, MD  anastrozole (ARIMIDEX) 1 MG tablet Take 1 mg by mouth daily.      Historical Provider, MD  aspirin 81 MG chewable tablet Chew 81 mg by mouth daily.      Historical Provider, MD  bisacodyl (DULCOLAX) 5 MG EC tablet Take 5 mg by mouth daily as needed.      Historical Provider, MD  buPROPion (WELLBUTRIN XL) 300 MG 24 hr tablet Take 300 mg by mouth daily.      Historical Provider, MD  Calcium Carbonate-Vitamin D (CALTRATE 600+D PO) Take by mouth 2 (two) times daily.      Historical Provider, MD  cloNIDine (CATAPRES) 0.3 MG tablet Take 0.3 mg by mouth 2 (two) times daily.      Historical Provider, MD  colchicine (COLCRYS) 0.6 MG tablet Take 0.6 mg by mouth daily.      Historical Provider, MD  Cyanocobalamin (B-12 PO) Take by mouth.      Historical Provider, MD  DULoxetine (CYMBALTA) 60 MG capsule Take 60 mg by mouth 2 (two) times daily.     Historical Provider, MD  febuxostat (ULORIC) 40 MG tablet Take 80 mg by mouth daily.  Historical Provider, MD  felodipine (PLENDIL) 2.5 MG 24 hr tablet Take 2.5 mg by mouth daily.      Historical Provider, MD  fentaNYL (DURAGESIC - DOSED MCG/HR) 50 MCG/HR Place 1 patch onto the skin every 3 (three) days.      Historical Provider, MD  fish oil-omega-3 fatty acids 1000 MG capsule Take 2 g by mouth daily. 01/04/12   Sherrlyn Hock, MD  Fluticasone-Salmeterol (ADVAIR) 250-50 MCG/DOSE AEPB Inhale 1 puff into the lungs every 12 (twelve) hours.      Historical Provider, MD  furosemide (LASIX) 40 MG tablet Take 40 mg by mouth 2 (two) times daily.      Historical Provider, MD  gabapentin (NEURONTIN) 100 MG capsule Take 100 mg by mouth 3 (three) times daily.      Historical Provider, MD  insulin aspart (NOVOLOG) 100 UNIT/ML injection Inject into the skin. Inject up to 50 units after meals, at bedtime & as directed for  Hyperglycemia.    Sherrlyn Hock, MD  insulin glargine (LANTUS) 100 UNIT/ML injection Inject into the skin. Inject 42 units at bedtime and 32 units in AM before breakfast.    Sherrlyn Hock, MD  levothyroxine (SYNTHROID, LEVOTHROID) 200 MCG tablet Take by mouth. Brand Name Synthroid Only. 1 tablet Mon, Wed,Fri.  1/2 tablet Tues., Thurs, Sat, Sun. 12/14/11   Sherrlyn Hock, MD  lisinopril (PRINIVIL,ZESTRIL) 10 MG tablet Take 10 mg by mouth 2 (two) times daily.      Historical Provider, MD  lisinopril-hydrochlorothiazide (PRINZIDE,ZESTORETIC) 20-25 MG per tablet Take 1 tablet by mouth daily.      Historical Provider, MD  LORazepam (ATIVAN) 0.5 MG tablet Take 0.5 mg by mouth every 8 (eight) hours.      Historical Provider, MD  metoprolol (LOPRESSOR) 100 MG tablet Take 100 mg by mouth 2 (two) times daily.      Historical Provider, MD  Misc Natural Products (TART CHERRY ADVANCED) CAPS Take 1 capsule by mouth daily. 01/04/12   Sherrlyn Hock, MD  Multiple Vitamin (MULTIVITAMIN) tablet Take 1 tablet by mouth daily.      Historical Provider, MD  nabumetone (RELAFEN) 500 MG tablet Take 500 mg by mouth 2 (two) times daily.      Historical Provider, MD  nitroGLYCERIN (NITROSTAT) 0.4 MG SL tablet Place 0.4 mg under the tongue every 5 (five) minutes as needed.      Historical Provider, MD  predniSONE (DELTASONE) 10 MG tablet Take 10 mg by mouth daily.      Historical Provider, MD  simvastatin (ZOCOR) 40 MG tablet Take 40 mg by mouth at bedtime.      Historical Provider, MD   There were no vitals taken for this visit. Physical Exam  Constitutional: She appears well-developed and well-nourished. No distress.  HENT:  Head: Normocephalic and atraumatic.  Eyes: Conjunctivae are normal. Right eye exhibits no discharge. Left eye exhibits no discharge.  Neck: Neck supple.  Cardiovascular: Normal rate, regular rhythm and normal heart sounds.  Exam reveals no gallop and no friction rub.   No murmur  heard. Pulmonary/Chest: Effort normal and breath sounds normal. No respiratory distress.  Abdominal: Soft. She exhibits no distension. There is no tenderness.  Musculoskeletal: She exhibits no edema or tenderness.  Neurological:  Eyes open but mildy drowsy appearing. Speech dysarthric but understandable. L gaze preference. L hemiparesis.   Skin: Skin is warm and dry.  Psychiatric: She has a normal mood and affect. Her behavior is normal. Thought content  normal.  Nursing note and vitals reviewed.   ED Course  Procedures (including critical care time) Labs Review Labs Reviewed  PROTIME-INR - Abnormal; Notable for the following:    Prothrombin Time 16.3 (*)    All other components within normal limits  COMPREHENSIVE METABOLIC PANEL - Abnormal; Notable for the following:    Sodium 130 (*)    Glucose, Bld 551 (*)    BUN 24 (*)    Creatinine, Ser 1.38 (*)    Albumin 3.2 (*)    AST 67 (*)    ALT 54 (*)    Alkaline Phosphatase 165 (*)    GFR calc non Af Amer 37 (*)    GFR calc Af Amer 43 (*)    All other components within normal limits  URINALYSIS, ROUTINE W REFLEX MICROSCOPIC - Abnormal; Notable for the following:    Glucose, UA >1000 (*)    Hgb urine dipstick TRACE (*)    Protein, ur >300 (*)    All other components within normal limits  I-STAT CHEM 8, ED - Abnormal; Notable for the following:    Sodium 131 (*)    BUN 26 (*)    Glucose, Bld 560 (*)    All other components within normal limits  CBG MONITORING, ED - Abnormal; Notable for the following:    Glucose-Capillary 495 (*)    All other components within normal limits  I-STAT CG4 LACTIC ACID, ED - Abnormal; Notable for the following:    Lactic Acid, Venous 2.12 (*)    All other components within normal limits  CBG MONITORING, ED - Abnormal; Notable for the following:    Glucose-Capillary 441 (*)    All other components within normal limits  CULTURE, BLOOD (ROUTINE X 2)  CULTURE, BLOOD (ROUTINE X 2)  APTT  CBC   DIFFERENTIAL  URINE MICROSCOPIC-ADD ON  LACTIC ACID, PLASMA  I-STAT TROPOININ, ED    Imaging Review No results found.   Ct Head Wo Contrast  05/07/2014   CLINICAL DATA:  Headache for 2 weeks. Tumor in neck. History of obesity, diabetes and autoimmune thyroiditis. Initial encounter.  EXAM: CT HEAD WITHOUT CONTRAST  TECHNIQUE: Contiguous axial images were obtained from the base of the skull through the vertex without intravenous contrast.  COMPARISON:  Head CT 05/03/2014.  MRI brain 05/03/2014.  FINDINGS: The approximately 3.1 x 1.7 cm right frontal lobe meningioma is again noted with associated low-density in the right frontal periventricular white matter and approximately 3 mm of right to left midline shift. There is no evidence of acute intracranial hemorrhage, new mass lesion or acute intracranial hemorrhage. There is no hydrocephalus or evidence of acute stroke.  The visualized paranasal sinuses, mastoid air cells and middle ears are clear. The calvarium is intact. There is stable hyperostosis frontalis. There is a stable right parietal scalp lesion.  IMPRESSION: 1. No acute intracranial findings. 2. Stable right frontal meningioma with associated surrounding vasogenic edema and mild mass effect.   Electronically Signed   By: Camie Patience M.D.   On: 05/07/2014 19:32   Ct Head (brain) Wo Contrast  05/03/2014   CLINICAL DATA:  Code stroke. Acute onset of left-sided facial droop, left arm and left leg weakness, and leftward head deviation. Initial encounter.  EXAM: CT HEAD WITHOUT CONTRAST  TECHNIQUE: Contiguous axial images were obtained from the base of the skull through the vertex without intravenous contrast.  COMPARISON:  CT of the head performed 06/03/2010  FINDINGS: There is an enlarging poorly characterized  mass at the high right frontoparietal region, measuring perhaps 5.4 x 3.3 cm, with vague central calcification and underlying vasogenic edema. Given that this was present in 2012, it is  compatible with an enlarging meningioma. The vasogenic edema is new from the prior study, but should not explain the patient's acute CVA symptoms. Approximately 6 mm of leftward midline shift is seen, without evidence of subfalcine herniation.  There is no evidence of or extra-axial hemorrhage. No definite acute infarct is identified. Mild periventricular white matter change likely reflects small vessel ischemic microangiopathy. Minimally decreased attenuation within the left cerebral peduncle is likely artifactual in nature.  The posterior fossa, including the cerebellum, brainstem and fourth ventricle, is within normal limits. The third and lateral ventricles, and basal ganglia are unremarkable in appearance.  There is no evidence of fracture; visualized osseous structures are unremarkable in appearance. The orbits are within normal limits. The paranasal sinuses and mastoid air cells are well-aerated. No significant soft tissue abnormalities are seen.  IMPRESSION: 1. No acute intracranial pathology seen on CT to explain the patient's acute symptoms. 2. Enlarging meningioma noted at the high right frontoparietal region, measuring perhaps 5.4 x 3.3 cm, with vague central calcification and underlying vasogenic edema. This has gradually enlarged since 2012, with new vasogenic edema. Approximately 6 mm of leftward midline shift is seen. However, this should not explain the patient's acute CVA symptoms, and should not affect the stroke plan for treatment, as deemed clinically appropriate. MRI is recommended for further evaluation. 3. Mild small vessel ischemic microangiopathy.  These results were called by telephone at the time of interpretation on 05/03/2014 at 7:59 pm to Dr. Virgel Manifold, who verbally acknowledged these results.   Electronically Signed   By: Garald Balding M.D.   On: 05/03/2014 20:05   Mr Jeri Cos UD Contrast  05/04/2014   CLINICAL DATA:  Initial evaluation for acute onset left-sided weakness,  difficulty with speech, gaze to the left. History of meningioma. Evaluate for stroke.  EXAM: MRI HEAD WITHOUT AND WITH CONTRAST  TECHNIQUE: Multiplanar, multiecho pulse sequences of the brain and surrounding structures were obtained without and with intravenous contrast.  CONTRAST:  51m MULTIHANCE GADOBENATE DIMEGLUMINE 529 MG/ML IV SOLN  COMPARISON:  Prior CT from earlier the same day as well as previous CT from 06/03/2010  FINDINGS: Enhancing dural-based extra-axial mass overlying the right frontal lobe measures approximately in 5.4 x 2.2 x 2.9 cm (series 19, image 34). There is associated dural tail with thickening and enhancement overlying the right cerebral convexity. The mass demonstrates fairly homogeneous postcontrast enhancement with heterogeneous T2 signal intensity and restricted diffusion. Finding most consistent with a meningioma. There is associated vasogenic edema within the subjacent right frontal lobe. There is associated 4 mm of right-to-left midline shift with minimal effacement of the anterior horn of the right lateral ventricle. No hydrocephalus. Basilar cisterns remain patent. No other mass lesion or abnormal enhancement.  No abnormal foci of restricted diffusion to suggest acute intracranial infarct. Gray-white matter differentiation otherwise maintained. Normal intravascular flow voids present. No intracranial hemorrhage.  No extra-axial fluid collection.  Craniocervical junction within normal limits. Pituitary gland normal.  No acute abnormality seen about the orbits.  Paranasal sinuses are clear. No mastoid effusion. Inner ear structures within normal limits.  Diffuse cerebral atrophy noted. Mild chronic microvascular ischemic changes present within the periventricular white matter and pons.  Hyperostosis frontalis interna noted. Calvarium otherwise unremarkable. No acute abnormality C within the scalp soft tissues. Bone marrow signal intensity  normal.  IMPRESSION: 1. 5.4 x 2.2 x 2.9 cm  right frontal meningioma with associated vasogenic edema and 4 mm of right-to-left midline shift. 2. No acute intracranial infarct or other abnormality identified. 3. Mild age-related atrophy with chronic microvascular ischemic disease.   Electronically Signed   By: Jeannine Boga M.D.   On: 05/04/2014 00:47   Dg Chest Portable 1 View  05/03/2014   CLINICAL DATA:  Acute onset of left-sided facial droop, left arm weakness and left leg weakness. Initial encounter.  EXAM: PORTABLE CHEST - 1 VIEW  COMPARISON:  Chest radiograph performed 08/27/2012  FINDINGS: The lungs are hypoexpanded. Left basilar airspace opacity may reflect atelectasis or possibly pneumonia. Mild vascular crowding is noted. No pleural effusion or pneumothorax is seen.  The cardiomediastinal silhouette is mildly enlarged. No acute osseous abnormalities are seen. Cervical spinal fusion hardware is partially imaged.  IMPRESSION: Lungs hypoexpanded. Left basilar airspace opacity may reflect atelectasis or possibly pneumonia. Mild cardiomegaly noted.   Electronically Signed   By: Garald Balding M.D.   On: 05/03/2014 20:40     EKG Interpretation None      MDM   Final diagnoses:  Abnormal head CT  Fever  Meningioma Vasogenic edema      Virgel Manifold, MD 05/08/14 [phone]

## 2014-05-03 NOTE — ED Notes (Signed)
Pt is now staying still for MRI. Will hold the Haldol for now. Blain Pais, primary RN informed. Pt stable, NAD.

## 2014-05-03 NOTE — ED Notes (Signed)
Internal medicine at [facility]. Neurology at [facility].

## 2014-05-04 ENCOUNTER — Inpatient Hospital Stay (HOSPITAL_COMMUNITY): Payer: Medicare Other

## 2014-05-04 LAB — GLUCOSE, CAPILLARY
GLUCOSE-CAPILLARY: 255 mg/dL — AB (ref 70–99)
GLUCOSE-CAPILLARY: 262 mg/dL — AB (ref 70–99)
GLUCOSE-CAPILLARY: 289 mg/dL — AB (ref 70–99)
GLUCOSE-CAPILLARY: 293 mg/dL — AB (ref 70–99)
GLUCOSE-CAPILLARY: 329 mg/dL — AB (ref 70–99)
Glucose-Capillary: 245 mg/dL — ABNORMAL HIGH (ref 70–99)
Glucose-Capillary: 217 mg/dL — ABNORMAL HIGH (ref 70–99)
Glucose-Capillary: 187 mg/dL — ABNORMAL HIGH (ref 70–99)
Glucose-Capillary: 155 mg/dL — ABNORMAL HIGH (ref 70–99)
Glucose-Capillary: 139 mg/dL — ABNORMAL HIGH (ref 70–99)
Glucose-Capillary: 138 mg/dL — ABNORMAL HIGH (ref 70–99)

## 2014-05-04 LAB — CBC
HCT: 37.6 % (ref 36.0–46.0)
HEMOGLOBIN: 12.7 g/dL (ref 12.0–15.0)
MCH: 30.8 pg (ref 26.0–34.0)
MCHC: 33.8 g/dL (ref 30.0–36.0)
MCV: 91 fL (ref 78.0–100.0)
Platelets: 168 10*3/uL (ref 150–400)
RBC: 4.13 MIL/uL (ref 3.87–5.11)
RDW: 12.6 % (ref 11.5–15.5)
WBC: 9.9 10*3/uL (ref 4.0–10.5)

## 2014-05-04 LAB — BASIC METABOLIC PANEL
ANION GAP: 5 (ref 5–15)
BUN: 22 mg/dL (ref 6–23)
CALCIUM: 8.8 mg/dL (ref 8.4–10.5)
CO2: 29 mmol/L (ref 19–32)
CREATININE: 1.32 mg/dL — AB (ref 0.50–1.10)
Chloride: 101 mmol/L (ref 96–112)
GFR calc non Af Amer: 39 mL/min — ABNORMAL LOW (ref >90)
GFR, EST AFRICAN AMERICAN: 45 mL/min — AB (ref >90)
GLUCOSE: 365 mg/dL — AB (ref 70–99)
POTASSIUM: 4.5 mmol/L (ref 3.5–5.1)
Sodium: 135 mmol/L (ref 135–145)

## 2014-05-04 LAB — MRSA PCR SCREENING: MRSA by PCR: NEGATIVE

## 2014-05-04 LAB — CBG MONITORING, ED: Glucose-Capillary: 364 mg/dL — ABNORMAL HIGH (ref 70–99)

## 2014-05-04 LAB — LACTIC ACID, PLASMA: Lactic Acid, Venous: 2.6 mmol/L (ref 0.5–2.0)

## 2014-05-04 MED ORDER — CETYLPYRIDINIUM CHLORIDE 0.05 % MT LIQD
7.0000 mL | Freq: Two times a day (BID) | OROMUCOSAL | Status: DC
Start: 2014-05-04 — End: 2014-05-08
  Administered 2014-05-04 – 2014-05-08 (×9): 7 mL via OROMUCOSAL

## 2014-05-04 MED ORDER — LORATADINE 10 MG PO TABS
10.0000 mg | ORAL_TABLET | Freq: Every day | ORAL | Status: DC | PRN
  Filled 2014-05-04: qty 1

## 2014-05-04 MED ORDER — PANTOPRAZOLE SODIUM 40 MG PO TBEC
40.0000 mg | DELAYED_RELEASE_TABLET | Freq: Two times a day (BID) | ORAL | Status: DC
  Administered 2014-05-04 – 2014-05-08 (×8): 40 mg via ORAL
  Filled 2014-05-04 (×8): qty 1

## 2014-05-04 MED ORDER — INSULIN DETEMIR 100 UNIT/ML ~~LOC~~ SOLN: 15.0000 [IU] | Freq: Every day | SUBCUTANEOUS | Status: DC

## 2014-05-04 MED ORDER — SODIUM CHLORIDE 0.9 % IV SOLN
100.0000 mg | Freq: Two times a day (BID) | INTRAVENOUS | Status: DC
  Filled 2014-05-04 (×2): qty 10

## 2014-05-04 MED ORDER — INSULIN DETEMIR 100 UNIT/ML ~~LOC~~ SOLN
15.0000 [IU] | Freq: Two times a day (BID) | SUBCUTANEOUS | Status: DC
  Administered 2014-05-04 – 2014-05-06 (×4): 15 [IU] via SUBCUTANEOUS
  Filled 2014-05-04 (×5): qty 0.15

## 2014-05-04 MED ORDER — INSULIN ASPART 100 UNIT/ML ~~LOC~~ SOLN: 0.0000 [IU] | Freq: Three times a day (TID) | SUBCUTANEOUS | Status: DC

## 2014-05-04 MED ORDER — ACETAMINOPHEN 650 MG RE SUPP: 650.0000 mg | Freq: Four times a day (QID) | RECTAL | Status: DC | PRN

## 2014-05-04 MED ORDER — ONDANSETRON HCL 4 MG/2ML IJ SOLN
4.0000 mg | Freq: Four times a day (QID) | INTRAMUSCULAR | Status: DC | PRN
  Administered 2014-05-04 – 2014-05-12 (×2): 4 mg via INTRAVENOUS
  Filled 2014-05-04 (×2): qty 2

## 2014-05-04 MED ORDER — MORPHINE SULFATE 2 MG/ML IJ SOLN
1.0000 mg | INTRAMUSCULAR | Status: DC | PRN
  Administered 2014-05-04 (×2): 1 mg via INTRAVENOUS
  Filled 2014-05-04 (×2): qty 1

## 2014-05-04 MED ORDER — METOPROLOL SUCCINATE ER 100 MG PO TB24
100.0000 mg | ORAL_TABLET | Freq: Every day | ORAL | Status: DC
  Administered 2014-05-04 – 2014-05-08 (×5): 100 mg via ORAL
  Filled 2014-05-04 (×5): qty 1

## 2014-05-04 MED ORDER — LORAZEPAM 0.5 MG PO TABS
0.5000 mg | ORAL_TABLET | Freq: Every day | ORAL | Status: DC
  Administered 2014-05-04 – 2014-05-06 (×3): 0.5 mg via ORAL
  Filled 2014-05-04 (×4): qty 1

## 2014-05-04 MED ORDER — LORAZEPAM 2 MG/ML IJ SOLN
INTRAMUSCULAR | Status: AC
  Filled 2014-05-04: qty 1

## 2014-05-04 MED ORDER — LISINOPRIL 40 MG PO TABS
40.0000 mg | ORAL_TABLET | Freq: Every day | ORAL | Status: DC
  Administered 2014-05-04 – 2014-05-08 (×5): 40 mg via ORAL
  Filled 2014-05-04 (×5): qty 1

## 2014-05-04 MED ORDER — INSULIN ASPART 100 UNIT/ML ~~LOC~~ SOLN
0.0000 [IU] | Freq: Three times a day (TID) | SUBCUTANEOUS | Status: DC
  Administered 2014-05-05: 15 [IU] via SUBCUTANEOUS
  Administered 2014-05-05 (×2): 20 [IU] via SUBCUTANEOUS
  Administered 2014-05-06: 15 [IU] via SUBCUTANEOUS
  Administered 2014-05-06: 20 [IU] via SUBCUTANEOUS
  Administered 2014-05-06: 11 [IU] via SUBCUTANEOUS
  Administered 2014-05-07: 4 [IU] via SUBCUTANEOUS
  Administered 2014-05-07: 7 [IU] via SUBCUTANEOUS
  Administered 2014-05-07: 11 [IU] via SUBCUTANEOUS
  Administered 2014-05-08: 4 [IU] via SUBCUTANEOUS

## 2014-05-04 MED ORDER — SIMVASTATIN 40 MG PO TABS
40.0000 mg | ORAL_TABLET | Freq: Every day | ORAL | Status: DC
  Administered 2014-05-04 – 2014-05-07 (×4): 40 mg via ORAL
  Filled 2014-05-04 (×5): qty 1

## 2014-05-04 MED ORDER — FEBUXOSTAT 40 MG PO TABS
40.0000 mg | ORAL_TABLET | Freq: Every day | ORAL | Status: DC
  Administered 2014-05-04 – 2014-05-08 (×5): 40 mg via ORAL
  Filled 2014-05-04 (×5): qty 1

## 2014-05-04 MED ORDER — LEVOTHYROXINE SODIUM 200 MCG PO TABS
200.0000 ug | ORAL_TABLET | ORAL | Status: DC
  Administered 2014-05-05 – 2014-05-11 (×6): 200 ug via ORAL
  Filled 2014-05-04 (×7): qty 1

## 2014-05-04 MED ORDER — GADOBENATE DIMEGLUMINE 529 MG/ML IV SOLN
10.0000 mL | Freq: Once | INTRAVENOUS | Status: AC | PRN
Start: 2014-05-04 — End: 2014-05-04
  Administered 2014-05-04: 10 mL via INTRAVENOUS

## 2014-05-04 MED ORDER — LACOSAMIDE 200 MG/20ML IV SOLN
200.0000 mg | Freq: Once | INTRAVENOUS | Status: AC
  Administered 2014-05-04: 200 mg via INTRAVENOUS
  Filled 2014-05-04 (×2): qty 20

## 2014-05-04 MED ORDER — LORAZEPAM 2 MG/ML IJ SOLN
1.0000 mg | Freq: Once | INTRAMUSCULAR | Status: AC
  Administered 2014-05-04: 1 mg via INTRAVENOUS

## 2014-05-04 MED ORDER — LEVOTHYROXINE SODIUM 150 MCG PO TABS
300.0000 ug | ORAL_TABLET | ORAL | Status: DC
  Administered 2014-05-10: 300 ug via ORAL
  Filled 2014-05-04: qty 2

## 2014-05-04 MED ORDER — INSULIN ASPART 100 UNIT/ML ~~LOC~~ SOLN
5.0000 [IU] | Freq: Once | SUBCUTANEOUS | Status: AC
  Administered 2014-05-04: 5 [IU] via SUBCUTANEOUS

## 2014-05-04 MED ORDER — DULOXETINE HCL 60 MG PO CPEP
60.0000 mg | ORAL_CAPSULE | Freq: Two times a day (BID) | ORAL | Status: DC
  Administered 2014-05-04 – 2014-05-08 (×8): 60 mg via ORAL
  Filled 2014-05-04 (×9): qty 1

## 2014-05-04 MED ORDER — INSULIN ASPART 100 UNIT/ML ~~LOC~~ SOLN
0.0000 [IU] | Freq: Three times a day (TID) | SUBCUTANEOUS | Status: DC
  Administered 2014-05-04 (×2): 5 [IU] via SUBCUTANEOUS

## 2014-05-04 MED ORDER — LEVETIRACETAM IN NACL 1000 MG/100ML IV SOLN
1000.0000 mg | Freq: Two times a day (BID) | INTRAVENOUS | Status: DC
  Administered 2014-05-04 – 2014-05-05 (×5): 1000 mg via INTRAVENOUS
  Filled 2014-05-04 (×5): qty 100

## 2014-05-04 MED ORDER — FELODIPINE ER 2.5 MG PO TB24
2.5000 mg | ORAL_TABLET | Freq: Every day | ORAL | Status: DC
  Administered 2014-05-04 – 2014-05-08 (×5): 2.5 mg via ORAL
  Filled 2014-05-04 (×5): qty 1

## 2014-05-04 MED ORDER — LEVOTHYROXINE SODIUM 200 MCG PO TABS: 200.0000 ug | ORAL_TABLET | Freq: Every day | ORAL | Status: DC

## 2014-05-04 MED ORDER — SODIUM CHLORIDE 0.9 % IV SOLN
INTRAVENOUS | Status: DC
  Administered 2014-05-04: 1000 mL via INTRAVENOUS
  Administered 2014-05-04 – 2014-05-07 (×3): via INTRAVENOUS
  Administered 2014-05-08: 1000 mL via INTRAVENOUS

## 2014-05-04 MED ORDER — GABAPENTIN 100 MG PO CAPS
100.0000 mg | ORAL_CAPSULE | Freq: Every day | ORAL | Status: DC
  Administered 2014-05-04 – 2014-05-08 (×5): 100 mg via ORAL
  Filled 2014-05-04 (×5): qty 1

## 2014-05-04 NOTE — Procedures (Signed)
ELECTROENCEPHALOGRAM REPORT  Patient: [name]       Room #: 1A44 EEG No. ID: [id number] Age: 74 y.o.        Sex: female Referring Physician: Isaac Bliss, E Report Date:  05/04/2014        Interpreting Physician: Anthony Sar  History: [name] is an 74 y.o. 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  Indications for study:  Rule out seizure activity.  Technique: This is an 18 channel routine scalp EEG performed at the bedside with bipolar and monopolar montages arranged in accordance to the international 10/20 system of electrode placement.    Description: Background EEG activity consisted of mild generalized slowing which was slightly greater involving right hemisphere compared to the left. There were frequent epileptiform discharges recorded from the right hemisphere involving the frontal and temporal regions primarily. At one point patient experienced a 1 minute focal seizure with continuous sharp wave activity recorded from the right hemisphere along with clinical manifestations consisting of right side extremity shaking. Photic stimulation and hyperventilation were not performed.  Interpretation: This EEG was abnormal with mild and was slowing of cerebral activity diffusely, slightly more pronounced involving right hemisphere, in addition to an epileptiform disturbance of cerebral activity involving the right hemisphere.   Rush Farmer M.D. [facility] [phone]

## 2014-05-04 NOTE — Evaluation (Signed)
Clinical/Bedside Swallow Evaluation Patient Details  Name: Morgan [name] MRN: [id number] Date of Birth: 08-Jun-1940  Today's Date: 05/04/2014 Time: SLP Start Time (ACUTE ONLY): 0955 SLP Stop Time (ACUTE ONLY): 1022 SLP Time Calculation (min) (ACUTE ONLY): 27 min  Past Medical History:  Past Medical History  Diagnosis Date  . Diabetes mellitus   . Obesity   . Diabetes type 2, uncontrolled   . Hypoglycemia associated with diabetes   . Edema of both legs   . Hypertension   . Combined hyperlipidemia   . Acquired autoimmune hypothyroidism   . Thyroiditis, autoimmune   . Abnormal liver function tests   . Sleep apnea, obstructive   . History of gastroesophageal reflux (GERD)   . Depression   . DM neuropathy with neurologic complication   . COPD with acute exacerbation   . Goiter   . Fatigue   . Vertigo   . Type II diabetes mellitus with peripheral angiopathy   . Gout   . Pallor    Past Surgical History:  Past Surgical History  Procedure Laterality Date  . Laparoscopic gastric banding    . Back surgery     HPI:  74 y.o. female with history of HTN, DM, breast cancer on remission for 5 years, renal cancer s/p nephrectomy, brought in as a code stroke due to acute onset confusion, left gaze preference, left hemiparesis, dysarthria.   Assessment / Plan / Recommendation Clinical Impression  Pt demosntrates adequate swallow function. Pt is alert, able to feed self, no signs of aspiration or oral dysphagia. Pt did reprot nausea following trials of applesauce, belching. Did not observe with solid foods due to these complaints. Expect pt can consume regular solids, but would recommend clear liquid diet for now. Defer diet advancement to RN/MD when pt ready, no SLP intervention needed at this time.     Aspiration Risk  Mild    Diet Recommendation Regular;Thin liquid (clear liquid until nausea improves)   Liquid Administration via: Cup;Straw Medication Administration: Whole meds with  liquid Supervision: Patient able to self feed Postural Changes and/or Swallow Maneuvers: Upright 30-60 min after meal;Seated upright 90 degrees    Other  Recommendations Oral Care Recommendations: Oral care BID   Follow Up Recommendations  24 hour supervision/assistance    Frequency and Duration        Pertinent Vitals/Pain NA    SLP Swallow Goals     Swallow Study Prior Functional Status       General HPI: 74 y.o. female with history of HTN, DM, breast cancer on remission for 5 years, renal cancer s/p nephrectomy, brought in as a code stroke due to acute onset confusion, left gaze preference, left hemiparesis, dysarthria. Type of Study: Bedside swallow evaluation Previous Swallow Assessment: none Diet Prior to this Study: NPO Temperature Spikes Noted: No Respiratory Status: Room air History of Recent Intubation: No Behavior/Cognition: Alert;Requires cueing Oral Cavity - Dentition: Adequate natural dentition (top denture?) Self-Feeding Abilities: Able to feed self Patient Positioning: Upright in bed Baseline Vocal Quality: Clear Volitional Cough: Strong Volitional Swallow: Able to elicit    Oral/Motor/Sensory Function Overall Oral Motor/Sensory Function: Appears within functional limits for tasks assessed   Ice Chips     Thin Liquid Thin Liquid: Within functional limits Presentation: Cup;Self Fed    Nectar Thick Nectar Thick Liquid: Not tested   Honey Thick Honey Thick Liquid: Not tested   Puree Puree: Within functional limits   Solid   GO    Solid: Not tested  Herbie Baltimore, [place] CCC-SLP [id number]  Catlin Doria, Katherene Ponto 05/04/2014,10:27 AM

## 2014-05-04 NOTE — Progress Notes (Signed)
Subjective: No further events.   Exam: Filed Vitals:   05/04/14 0813  BP: 173/61  Pulse: 74  Temp: 99.3 F (37.4 C)  Resp: 18   Gen: In bed, NAD MS: awake, alert, answers questions and follows commnads.  RR:NHAFB, right gaze preference, left field cut.  Motor: 4+/5 in the left arm, 4/5 in the left leg.  Sensory: intact to LT   Impression: 74 yo F with new onset seizure in the setting of a right frontal meningioma. Her left sided symptoms had started prior to seizure, but she had attributed them "to my bad knee." There may also be a prolonged Todd's associated with a focal mass.   Recommendations: 1) Continue keppra 596m BID 2) continue decadron [id number].  3) EEG pending  McRoland RackMD [facility] [phone] 7pm- 7am, please page neurology on call as listed in [place]

## 2014-05-04 NOTE — Progress Notes (Signed)
Inpatient Diabetes Program Recommendations  AACE/ADA: New Consensus Statement on Inpatient Glycemic Control (2013)  Target Ranges:  Prepandial:   less than 140 mg/dL      Peak postprandial:   less than 180 mg/dL (1-2 hours)      Critically ill patients:  140 - 180 mg/dL   Reason for Visit: Note admitting glucose=551 mg/dL.   Diabetes history: Type 2 Diabetes Outpatient Diabetes medications: U500 insulin 4-18 units based on scale (note concentrated insulin) and Victoza 1.8 mg daily Current orders for Inpatient glycemic control:  Patient transitioned off insulin drip to Novolog sensitive tid with meals.  Note patient is also on Decadron 4 mg 4 times daily.  Consider increasing Novolog correction to resistant q 4 hours since patient NPO but remains on IV steroids. May also need basal insulin such as Levemir 15 units bid (0.3 units/kg).    Thanks, Adah Perl, RN, BC-ADM Inpatient Diabetes Coordinator Pager [phone]

## 2014-05-04 NOTE — Progress Notes (Signed)
Partial seizure captured on EEG. Now having recurrent eye deviation to the left with preserved conciousness concerning for partial seizures.  Will treat with ativan 23m x 1, and load with vimpat.   MRoland Rack MD [facility] [phone] If 7pm- 7am, please page neurology on call as listed in [place]

## 2014-05-04 NOTE — Progress Notes (Signed)
[facility] PROGRESS NOTE  BURGUNDY MATUSZAK MBW:[id number] DOB: [birth year] DOA: 05/03/2014 PCP: Gilford Rile, MD  Assessment/Plan: Right Frontal Meningioma -With vasogenic edema and 4 mm mid line shift. -Continue decadron. -Have left message for Dr. Christella Noa at his office for consultation. -Transient episodes of decreased responsiveness concerning for focal seizures. -Appreciate neurology input: has been loaded with keppra and will continue at 500 mg BID. -EEG today.  DM II -CBGs elevated. -Start SSI.  HTN -BP Elevated. -Continue PRN labetalol IV given NPO state.  Code Status: Full Code Family Communication: No family at bedside  Disposition Plan: To be determined   Consultants:  Neurology  Neurosurgery   Antibiotics:  None   Subjective: Fatigued. She is unable to tell me much today.  Objective: Filed Vitals:   05/04/14 0700 05/04/14 0808 05/04/14 0813 05/04/14 0846  BP: 162/62 102/49 173/61   Pulse: 79 47 74   Temp:  97.3 F (36.3 C) 99.3 F (37.4 C)   TempSrc:  Oral Axillary   Resp: 24 15 18    Height:      Weight:      SpO2: 96% 100% 98% 98%    Intake/Output Summary (Last 24 hours) at 05/04/14 0943 Last data filed at 05/04/14 0108  Gross per 24 hour  Intake   1100 ml  Output    300 ml  Net    800 ml   Filed Weights   05/03/14 2108  Weight: 104.327 kg (230 lb)    Exam:   General:  Awake, non very talkative today  Cardiovascular: RRR  Respiratory: CTA B  Abdomen: obese/S/NT/ND/+BS  Extremities: trace bilateral edema   Data Reviewed: Basic Metabolic Panel:  Recent Labs Lab 05/03/14 1947 05/03/14 1959 05/04/14 0100  NA 130* 131* 135  K 4.2 4.2 4.5  CL 96 97 101  CO2 22  --  29  GLUCOSE 551* 560* 365*  BUN 24* 26* 22  CREATININE 1.38* 1.10 1.32*  CALCIUM 8.9  --  8.8   Liver Function Tests:  Recent Labs Lab 05/03/14 1947  AST 67*  ALT 54*  ALKPHOS 165*  BILITOT 0.7  PROT 7.3  ALBUMIN 3.2*   No results  for input(s): LIPASE, AMYLASE in the last 168 hours. No results for input(s): AMMONIA in the last 168 hours. CBC:  Recent Labs Lab 05/03/14 1947 05/03/14 1959 05/04/14 0100  WBC 6.7  --  9.9  NEUTROABS 3.7  --   --   HGB 12.2 13.3 12.7  HCT 36.1 39.0 37.6  MCV 88.7  --  91.0  PLT 171  --  168   Cardiac Enzymes: No results for input(s): CKTOTAL, CKMB, CKMBINDEX, TROPONINI in the last 168 hours. BNP (last 3 results) No results for input(s): PROBNP in the last 8760 hours. CBG:  Recent Labs Lab 05/04/14 0302 05/04/14 0402 05/04/14 0512 05/04/14 0615 05/04/14 0728  GLUCAP 245* 262* 217* 187* 155*    Recent Results (from the past 240 hour(s))  MRSA PCR Screening     Status: None   Collection Time: 05/04/14 12:48 AM  Result Value Ref Range Status   MRSA by PCR NEGATIVE NEGATIVE Final    Comment:        The GeneXpert MRSA Assay (FDA approved for NASAL specimens only), is one component of a comprehensive MRSA colonization surveillance program. It is not intended to diagnose MRSA infection nor to guide or monitor treatment for MRSA infections.      Studies: Ct Head (brain) Wo Contrast  05/03/2014   CLINICAL DATA:  Code stroke. Acute onset of left-sided facial droop, left arm and left leg weakness, and leftward head deviation. Initial encounter.  EXAM: CT HEAD WITHOUT CONTRAST  TECHNIQUE: Contiguous axial images were obtained from the base of the skull through the vertex without intravenous contrast.  COMPARISON:  CT of the head performed 06/03/2010  FINDINGS: There is an enlarging poorly characterized mass at the high right frontoparietal region, measuring perhaps 5.4 x 3.3 cm, with vague central calcification and underlying vasogenic edema. Given that this was present in 2012, it is compatible with an enlarging meningioma. The vasogenic edema is new from the prior study, but should not explain the patient's acute CVA symptoms. Approximately 6 mm of leftward midline shift is  seen, without evidence of subfalcine herniation.  There is no evidence of or extra-axial hemorrhage. No definite acute infarct is identified. Mild periventricular white matter change likely reflects small vessel ischemic microangiopathy. Minimally decreased attenuation within the left cerebral peduncle is likely artifactual in nature.  The posterior fossa, including the cerebellum, brainstem and fourth ventricle, is within normal limits. The third and lateral ventricles, and basal ganglia are unremarkable in appearance.  There is no evidence of fracture; visualized osseous structures are unremarkable in appearance. The orbits are within normal limits. The paranasal sinuses and mastoid air cells are well-aerated. No significant soft tissue abnormalities are seen.  IMPRESSION: 1. No acute intracranial pathology seen on CT to explain the patient's acute symptoms. 2. Enlarging meningioma noted at the high right frontoparietal region, measuring perhaps 5.4 x 3.3 cm, with vague central calcification and underlying vasogenic edema. This has gradually enlarged since 2012, with new vasogenic edema. Approximately 6 mm of leftward midline shift is seen. However, this should not explain the patient's acute CVA symptoms, and should not affect the stroke plan for treatment, as deemed clinically appropriate. MRI is recommended for further evaluation. 3. Mild small vessel ischemic microangiopathy.  These results were called by telephone at the time of interpretation on 05/03/2014 at 7:59 pm to Dr. Virgel Manifold, who verbally acknowledged these results.   Electronically Signed   By: Garald Balding M.D.   On: 05/03/2014 20:05   Mr Jeri Cos JJ Contrast  05/04/2014   CLINICAL DATA:  Initial evaluation for acute onset left-sided weakness, difficulty with speech, gaze to the left. History of meningioma. Evaluate for stroke.  EXAM: MRI HEAD WITHOUT AND WITH CONTRAST  TECHNIQUE: Multiplanar, multiecho pulse sequences of the brain and  surrounding structures were obtained without and with intravenous contrast.  CONTRAST:  3m MULTIHANCE GADOBENATE DIMEGLUMINE 529 MG/ML IV SOLN  COMPARISON:  Prior CT from earlier the same day as well as previous CT from 06/03/2010  FINDINGS: Enhancing dural-based extra-axial mass overlying the right frontal lobe measures approximately in 5.4 x 2.2 x 2.9 cm (series 19, image 34). There is associated dural tail with thickening and enhancement overlying the right cerebral convexity. The mass demonstrates fairly homogeneous postcontrast enhancement with heterogeneous T2 signal intensity and restricted diffusion. Finding most consistent with a meningioma. There is associated vasogenic edema within the subjacent right frontal lobe. There is associated 4 mm of right-to-left midline shift with minimal effacement of the anterior horn of the right lateral ventricle. No hydrocephalus. Basilar cisterns remain patent. No other mass lesion or abnormal enhancement.  No abnormal foci of restricted diffusion to suggest acute intracranial infarct. Gray-white matter differentiation otherwise maintained. Normal intravascular flow voids present. No intracranial hemorrhage.  No extra-axial fluid collection.  Craniocervical  junction within normal limits. Pituitary gland normal.  No acute abnormality seen about the orbits.  Paranasal sinuses are clear. No mastoid effusion. Inner ear structures within normal limits.  Diffuse cerebral atrophy noted. Mild chronic microvascular ischemic changes present within the periventricular white matter and pons.  Hyperostosis frontalis interna noted. Calvarium otherwise unremarkable. No acute abnormality C within the scalp soft tissues. Bone marrow signal intensity normal.  IMPRESSION: 1. 5.4 x 2.2 x 2.9 cm right frontal meningioma with associated vasogenic edema and 4 mm of right-to-left midline shift. 2. No acute intracranial infarct or other abnormality identified. 3. Mild age-related atrophy with  chronic microvascular ischemic disease.   Electronically Signed   By: Jeannine Boga M.D.   On: 05/04/2014 00:47   Dg Chest Portable 1 View  05/03/2014   CLINICAL DATA:  Acute onset of left-sided facial droop, left arm weakness and left leg weakness. Initial encounter.  EXAM: PORTABLE CHEST - 1 VIEW  COMPARISON:  Chest radiograph performed 08/27/2012  FINDINGS: The lungs are hypoexpanded. Left basilar airspace opacity may reflect atelectasis or possibly pneumonia. Mild vascular crowding is noted. No pleural effusion or pneumothorax is seen.  The cardiomediastinal silhouette is mildly enlarged. No acute osseous abnormalities are seen. Cervical spinal fusion hardware is partially imaged.  IMPRESSION: Lungs hypoexpanded. Left basilar airspace opacity may reflect atelectasis or possibly pneumonia. Mild cardiomegaly noted.   Electronically Signed   By: Garald Balding M.D.   On: 05/03/2014 20:40    Scheduled Meds: . antiseptic oral rinse  7 mL Mouth Rinse BID  . dexamethasone  4 mg Intravenous 4 times per day  . haloperidol lactate  2 mg Intravenous Once  . insulin regular  0-10 Units Intravenous TID WC  . levETIRAcetam  1,000 mg Intravenous Q12H  . mometasone-formoterol  2 puff Inhalation BID   Continuous Infusions: . sodium chloride    . dextrose 5 % and 0.45% NaCl 50 mL/hr (05/04/14 0911)  . insulin (NOVOLIN-R) infusion 6.3 Units/hr (05/04/14 0901)    Active Problems:   Hypertension   Diabetes type 2, uncontrolled   Left hemiparesis   Brain mass   Acute encephalopathy   Left-sided weakness   Aphasia    Time spent: 35 minutes.  Greater than 50% of this time was spent in direct contact with the patient coordinating care.    Lelon Frohlich  [facility] Pager [phone]  If 7PM-7AM, please contact night-coverage at [email or web address], password [email or web address] 05/04/2014, 9:43 AM  LOS: 1 day

## 2014-05-04 NOTE — Progress Notes (Signed)
EEG Completed; Results Pending  

## 2014-05-04 NOTE — ED Notes (Signed)
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

## 2014-05-04 NOTE — Progress Notes (Signed)
Utilization Review Completed.[name] T1/25/2016

## 2014-05-04 NOTE — Progress Notes (Signed)
CRITICAL VALUE ALERT  Critical value received:  Lactic Acid 2.6  Date of notification:  05/04/14  Time of notification:  0200  Critical value read back: YES  Nurse who received alert:  Sherryl Manges  MD notified (1st page):  Schorr  Time of first page:  0223  MD notified (2nd page):  Time of second page:  Responding MD:   Time MD responded:

## 2014-05-04 NOTE — Progress Notes (Signed)
Pt arrived from ED via stretcher. Pt given CHG bath and MRSA swab sent to lab. BP 220/80 and HR 90. Gave 10mg  labetalol per PRN order. Will continue to monitor.    Hart Rochester, RN, BSN

## 2014-05-04 NOTE — Consult Note (Signed)
Reason for Consult:Brain tumor Referring Physician: Akeyla, Morgan [name] is an 74 y.o. female.  HPI: whom yesterday probably had a focal seizure due to meningioma/mass in right frontal lobe. MRI showed minimal enlargement of the tumor, but now with vasogenic edema. No significant mass effect. Has been placed on Keppra, and decadron. Currently alert, following commands. Continued weakness on left side, left facial droop.   Past Medical History  Diagnosis Date  . Diabetes mellitus   . Obesity   . Diabetes type 2, uncontrolled   . Hypoglycemia associated with diabetes   . Edema of both legs   . Hypertension   . Combined hyperlipidemia   . Acquired autoimmune hypothyroidism   . Thyroiditis, autoimmune   . Abnormal liver function tests   . Sleep apnea, obstructive   . History of gastroesophageal reflux (GERD)   . Depression   . DM neuropathy with neurologic complication   . COPD with acute exacerbation   . Goiter   . Fatigue   . Vertigo   . Type II diabetes mellitus with peripheral angiopathy   . Gout   . Pallor     Past Surgical History  Procedure Laterality Date  . Laparoscopic gastric banding    . Back surgery      Family History  Problem Relation Age of Onset  . Diabetes Father   . Diabetes Sister   . Diabetes Brother     Social History:  reports that she has never smoked. She does not have any smokeless tobacco history on file. She reports that she does not drink alcohol or use illicit drugs.  Allergies:  Allergies  Allergen Reactions  . Latex Rash  . Sulfa Antibiotics Other (See Comments)    Unknown allergic reaction  . Penicillins Rash    Medications: I have reviewed the patient's current medications.  Results for orders placed or performed during the hospital encounter of 05/03/14 (from the past 48 hour(s))  Protime-INR     Status: Abnormal   Collection Time: 05/03/14  7:47 PM  Result Value Ref Range   Prothrombin Time 16.3 (H) 11.6 -  15.2 seconds   INR 1.29 0.00 - 1.49  APTT     Status: None   Collection Time: 05/03/14  7:47 PM  Result Value Ref Range   aPTT 29 24 - 37 seconds  CBC     Status: None   Collection Time: 05/03/14  7:47 PM  Result Value Ref Range   WBC 6.7 4.0 - 10.5 K/uL   RBC 4.07 3.87 - 5.11 MIL/uL   Hemoglobin 12.2 12.0 - 15.0 g/dL   HCT 36.1 36.0 - 46.0 %   MCV 88.7 78.0 - 100.0 fL   MCH 30.0 26.0 - 34.0 pg   MCHC 33.8 30.0 - 36.0 g/dL   RDW 12.7 11.5 - 15.5 %   Platelets 171 150 - 400 K/uL  Differential     Status: None   Collection Time: 05/03/14  7:47 PM  Result Value Ref Range   Neutrophils Relative % 56 43 - 77 %   Neutro Abs 3.7 1.7 - 7.7 K/uL   Lymphocytes Relative 34 12 - 46 %   Lymphs Abs 2.3 0.7 - 4.0 K/uL   Monocytes Relative 7 3 - 12 %   Monocytes Absolute 0.5 0.1 - 1.0 K/uL   Eosinophils Relative 3 0 - 5 %   Eosinophils Absolute 0.2 0.0 - 0.7 K/uL   Basophils Relative 0 0 - 1 %  Basophils Absolute 0.0 0.0 - 0.1 K/uL  Comprehensive metabolic panel     Status: Abnormal   Collection Time: 05/03/14  7:47 PM  Result Value Ref Range   Sodium 130 ([name]) 135 - 145 mmol/[name]   Potassium 4.2 3.5 - 5.1 mmol/[name]   Chloride 96 96 - 112 mmol/[name]   CO2 22 19 - 32 mmol/[name]   Glucose, Bld 551 (HH) 70 - 99 mg/dL    Comment: CRITICAL RESULT CALLED TO, READ BACK BY AND VERIFIED WITH: LAMBERT B,RN 05/03/14 2122 WAYK    BUN 24 (H) 6 - 23 mg/dL   Creatinine, Ser 1.38 (H) 0.50 - 1.10 mg/dL   Calcium 8.9 8.4 - 10.5 mg/dL   Total Protein 7.3 6.0 - 8.3 g/dL   Albumin 3.2 ([name]) 3.5 - 5.2 g/dL   AST 67 (H) 0 - 37 U/[name]   ALT 54 (H) 0 - 35 U/[name]   Alkaline Phosphatase 165 (H) 39 - 117 U/[name]   Total Bilirubin 0.7 0.3 - 1.2 mg/dL   GFR calc non Af Amer 37 ([name]) >90 mL/min   GFR calc Af Amer 43 ([name]) >90 mL/min    Comment: (NOTE) The eGFR has been calculated using the CKD EPI equation. This calculation has not been validated in all clinical situations. eGFR's persistently <90 mL/min signify possible Chronic  Kidney Disease.    Anion gap 12 5 - 15  I-stat troponin, ED (not at [facility])     Status: None   Collection Time: 05/03/14  7:57 PM  Result Value Ref Range   Troponin i, poc 0.00 0.00 - 0.08 ng/mL   Comment 3            Comment: Due to the release kinetics of cTnI, a negative result within the first hours of the onset of symptoms does not rule out myocardial infarction with certainty. If myocardial infarction is still suspected, repeat the test at appropriate intervals.   I-Stat Chem 8, ED     Status: Abnormal   Collection Time: 05/03/14  7:59 PM  Result Value Ref Range   Sodium 131 ([name]) 135 - 145 mmol/[name]   Potassium 4.2 3.5 - 5.1 mmol/[name]   Chloride 97 96 - 112 mmol/[name]   BUN 26 (H) 6 - 23 mg/dL   Creatinine, Ser 1.10 0.50 - 1.10 mg/dL   Glucose, Bld 560 (HH) 70 - 99 mg/dL   Calcium, Ion 1.19 1.13 - 1.30 mmol/[name]   TCO2 20 0 - 100 mmol/[name]   Hemoglobin 13.3 12.0 - 15.0 g/dL   HCT 39.0 36.0 - 46.0 %   Comment NOTIFIED PHYSICIAN   CBG monitoring, ED     Status: Abnormal   Collection Time: 05/03/14  8:01 PM  Result Value Ref Range   Glucose-Capillary 495 (H) 70 - 99 mg/dL  Urinalysis, Routine w reflex microscopic     Status: Abnormal   Collection Time: 05/03/14  8:21 PM  Result Value Ref Range   Color, Urine YELLOW YELLOW   APPearance CLEAR CLEAR   Specific Gravity, Urine 1.027 1.005 - 1.030   pH 5.0 5.0 - 8.0   Glucose, UA >1000 (A) NEGATIVE mg/dL   Hgb urine dipstick TRACE (A) NEGATIVE   Bilirubin Urine NEGATIVE NEGATIVE   Ketones, ur NEGATIVE NEGATIVE mg/dL   Protein, ur >300 (A) NEGATIVE mg/dL   Urobilinogen, UA 0.2 0.0 - 1.0 mg/dL   Nitrite NEGATIVE NEGATIVE   Leukocytes, UA NEGATIVE NEGATIVE  Urine microscopic-add on     Status: None  Collection Time: 05/03/14  8:21 PM  Result Value Ref Range   Squamous Epithelial / LPF RARE RARE   RBC / HPF 0-2 <3 RBC/hpf  I-Stat CG4 Lactic Acid, ED     Status: Abnormal   Collection Time: 05/03/14  8:40 PM  Result Value Ref Range    Lactic Acid, Venous 2.12 (HH) 0.5 - 2.0 mmol/[name]   Comment NOTIFIED PHYSICIAN   CBG monitoring, ED     Status: Abnormal   Collection Time: 05/03/14  9:07 PM  Result Value Ref Range   Glucose-Capillary 441 (H) 70 - 99 mg/dL  CBG monitoring, ED     Status: Abnormal   Collection Time: 05/03/14 10:17 PM  Result Value Ref Range   Glucose-Capillary 417 (H) 70 - 99 mg/dL  CBG monitoring, ED     Status: Abnormal   Collection Time: 05/03/14 11:45 PM  Result Value Ref Range   Glucose-Capillary 364 (H) 70 - 99 mg/dL  MRSA PCR Screening     Status: None   Collection Time: 05/04/14 12:48 AM  Result Value Ref Range   MRSA by PCR NEGATIVE NEGATIVE    Comment:        The GeneXpert MRSA Assay (FDA approved for NASAL specimens only), is one component of a comprehensive MRSA colonization surveillance program. It is not intended to diagnose MRSA infection nor to guide or monitor treatment for MRSA infections.   Glucose, capillary     Status: Abnormal   Collection Time: 05/04/14 12:50 AM  Result Value Ref Range   Glucose-Capillary 329 (H) 70 - 99 mg/dL  Lactic acid, plasma     Status: Abnormal   Collection Time: 05/04/14  1:00 AM  Result Value Ref Range   Lactic Acid, Venous 2.6 (HH) 0.5 - 2.0 mmol/[name]    Comment: Please note change in reference range. REPEATED TO VERIFY CRITICAL RESULT CALLED TO, READ BACK BY AND VERIFIED WITH: Lowella Bandy [id number] [place]   CBC     Status: None   Collection Time: 05/04/14  1:00 AM  Result Value Ref Range   WBC 9.9 4.0 - 10.5 K/uL   RBC 4.13 3.87 - 5.11 MIL/uL   Hemoglobin 12.7 12.0 - 15.0 g/dL   HCT 37.6 36.0 - 46.0 %   MCV 91.0 78.0 - 100.0 fL   MCH 30.8 26.0 - 34.0 pg   MCHC 33.8 30.0 - 36.0 g/dL   RDW 12.6 11.5 - 15.5 %   Platelets 168 150 - 400 K/uL  Basic metabolic panel     Status: Abnormal   Collection Time: 05/04/14  1:00 AM  Result Value Ref Range   Sodium 135 135 - 145 mmol/[name]   Potassium 4.5 3.5 - 5.1 mmol/[name]   Chloride 101 96 - 112  mmol/[name]   CO2 29 19 - 32 mmol/[name]   Glucose, Bld 365 (H) 70 - 99 mg/dL   BUN 22 6 - 23 mg/dL   Creatinine, Ser 1.32 (H) 0.50 - 1.10 mg/dL   Calcium 8.8 8.4 - 10.5 mg/dL   GFR calc non Af Amer 39 ([name]) >90 mL/min   GFR calc Af Amer 45 ([name]) >90 mL/min    Comment: (NOTE) The eGFR has been calculated using the CKD EPI equation. This calculation has not been validated in all clinical situations. eGFR's persistently <90 mL/min signify possible Chronic Kidney Disease.    Anion gap 5 5 - 15  Glucose, capillary     Status: Abnormal   Collection Time: 05/04/14  1:56  AM  Result Value Ref Range   Glucose-Capillary 289 (H) 70 - 99 mg/dL  Glucose, capillary     Status: Abnormal   Collection Time: 05/04/14  3:02 AM  Result Value Ref Range   Glucose-Capillary 245 (H) 70 - 99 mg/dL  Glucose, capillary     Status: Abnormal   Collection Time: 05/04/14  4:02 AM  Result Value Ref Range   Glucose-Capillary 262 (H) 70 - 99 mg/dL  Glucose, capillary     Status: Abnormal   Collection Time: 05/04/14  5:12 AM  Result Value Ref Range   Glucose-Capillary 217 (H) 70 - 99 mg/dL  Glucose, capillary     Status: Abnormal   Collection Time: 05/04/14  6:15 AM  Result Value Ref Range   Glucose-Capillary 187 (H) 70 - 99 mg/dL  Glucose, capillary     Status: Abnormal   Collection Time: 05/04/14  7:28 AM  Result Value Ref Range   Glucose-Capillary 155 (H) 70 - 99 mg/dL    Ct Head (brain) Wo Contrast  05/03/2014   CLINICAL DATA:  Code stroke. Acute onset of left-sided facial droop, left arm and left leg weakness, and leftward head deviation. Initial encounter.  EXAM: CT HEAD WITHOUT CONTRAST  TECHNIQUE: Contiguous axial images were obtained from the base of the skull through the vertex without intravenous contrast.  COMPARISON:  CT of the head performed 06/03/2010  FINDINGS: There is an enlarging poorly characterized mass at the high right frontoparietal region, measuring perhaps 5.4 x 3.3 cm, with vague central  calcification and underlying vasogenic edema. Given that this was present in 2012, it is compatible with an enlarging meningioma. The vasogenic edema is new from the prior study, but should not explain the patient's acute CVA symptoms. Approximately 6 mm of leftward midline shift is seen, without evidence of subfalcine herniation.  There is no evidence of or extra-axial hemorrhage. No definite acute infarct is identified. Mild periventricular white matter change likely reflects small vessel ischemic microangiopathy. Minimally decreased attenuation within the left cerebral peduncle is likely artifactual in nature.  The posterior fossa, including the cerebellum, brainstem and fourth ventricle, is within normal limits. The third and lateral ventricles, and basal ganglia are unremarkable in appearance.  There is no evidence of fracture; visualized osseous structures are unremarkable in appearance. The orbits are within normal limits. The paranasal sinuses and mastoid air cells are well-aerated. No significant soft tissue abnormalities are seen.  IMPRESSION: 1. No acute intracranial pathology seen on CT to explain the patient's acute symptoms. 2. Enlarging meningioma noted at the high right frontoparietal region, measuring perhaps 5.4 x 3.3 cm, with vague central calcification and underlying vasogenic edema. This has gradually enlarged since 2012, with new vasogenic edema. Approximately 6 mm of leftward midline shift is seen. However, this should not explain the patient's acute CVA symptoms, and should not affect the stroke plan for treatment, as deemed clinically appropriate. MRI is recommended for further evaluation. 3. Mild small vessel ischemic microangiopathy.  These results were called by telephone at the time of interpretation on 05/03/2014 at 7:59 pm to Dr. Virgel Manifold, who verbally acknowledged these results.   Electronically Signed   By: Garald Balding M.D.   On: 05/03/2014 20:05   Mr Jeri Cos EM  Contrast  05/04/2014   CLINICAL DATA:  Initial evaluation for acute onset left-sided weakness, difficulty with speech, gaze to the left. History of meningioma. Evaluate for stroke.  EXAM: MRI HEAD WITHOUT AND WITH CONTRAST  TECHNIQUE: Multiplanar, multiecho  pulse sequences of the brain and surrounding structures were obtained without and with intravenous contrast.  CONTRAST:  72m MULTIHANCE GADOBENATE DIMEGLUMINE 529 MG/ML IV SOLN  COMPARISON:  Prior CT from earlier the same day as well as previous CT from 06/03/2010  FINDINGS: Enhancing dural-based extra-axial mass overlying the right frontal lobe measures approximately in 5.4 x 2.2 x 2.9 cm (series 19, image 34). There is associated dural tail with thickening and enhancement overlying the right cerebral convexity. The mass demonstrates fairly homogeneous postcontrast enhancement with heterogeneous T2 signal intensity and restricted diffusion. Finding most consistent with a meningioma. There is associated vasogenic edema within the subjacent right frontal lobe. There is associated 4 mm of right-to-left midline shift with minimal effacement of the anterior horn of the right lateral ventricle. No hydrocephalus. Basilar cisterns remain patent. No other mass lesion or abnormal enhancement.  No abnormal foci of restricted diffusion to suggest acute intracranial infarct. Gray-white matter differentiation otherwise maintained. Normal intravascular flow voids present. No intracranial hemorrhage.  No extra-axial fluid collection.  Craniocervical junction within normal limits. Pituitary gland normal.  No acute abnormality seen about the orbits.  Paranasal sinuses are clear. No mastoid effusion. Inner ear structures within normal limits.  Diffuse cerebral atrophy noted. Mild chronic microvascular ischemic changes present within the periventricular white matter and pons.  Hyperostosis frontalis interna noted. Calvarium otherwise unremarkable. No acute abnormality C within  the scalp soft tissues. Bone marrow signal intensity normal.  IMPRESSION: 1. 5.4 x 2.2 x 2.9 cm right frontal meningioma with associated vasogenic edema and 4 mm of right-to-left midline shift. 2. No acute intracranial infarct or other abnormality identified. 3. Mild age-related atrophy with chronic microvascular ischemic disease.   Electronically Signed   By: BJeannine BogaM.D.   On: 05/04/2014 00:47   Dg Chest Portable 1 View  05/03/2014   CLINICAL DATA:  Acute onset of left-sided facial droop, left arm weakness and left leg weakness. Initial encounter.  EXAM: PORTABLE CHEST - 1 VIEW  COMPARISON:  Chest radiograph performed 08/27/2012  FINDINGS: The lungs are hypoexpanded. Left basilar airspace opacity may reflect atelectasis or possibly pneumonia. Mild vascular crowding is noted. No pleural effusion or pneumothorax is seen.  The cardiomediastinal silhouette is mildly enlarged. No acute osseous abnormalities are seen. Cervical spinal fusion hardware is partially imaged.  IMPRESSION: Lungs hypoexpanded. Left basilar airspace opacity may reflect atelectasis or possibly pneumonia. Mild cardiomegaly noted.   Electronically Signed   By: JGarald BaldingM.D.   On: 05/03/2014 20:40    Review of Systems  Skin: Negative.   Neurological: Positive for speech change, focal weakness, seizures, weakness and headaches. Negative for loss of consciousness.  Psychiatric/Behavioral: Negative.    Blood pressure 173/61, pulse 74, temperature 99.3 F (37.4 C), temperature source Axillary, resp. rate 18, height 5' 4"  (1.626 m), weight 104.327 kg (230 lb), SpO2 98 %. Physical Exam  Constitutional: She is oriented to person, place, and time. She appears well-developed and well-nourished. She appears distressed.  HENT:  Head: Normocephalic and atraumatic.  Right Ear: External ear normal.  Left Ear: External ear normal.  Eyes: Conjunctivae and EOM are normal. Pupils are equal, round, and reactive to light.  Neck:  Normal range of motion. Neck supple.  Cardiovascular: Normal rate and regular rhythm.   GI: Soft. Bowel sounds are normal.  Neurological: She is alert and oriented to person, place, and time. A cranial nerve deficit and sensory deficit is present.  Left sided plegia Left facial droop Coordination is  not normal    Assessment/Plan: Will for right now plan on treating with decadron, and keppra. There is no indication for emergent/urgent surgery. She will obviously need for the tumor to be resected but it can be done on an elective basis. Morgan [name] and her daughter have had extensive contact with Dr. Glenna Fellows and they would like to speak with him about this before doing anything else. I will contact Dr. Carloyn Manner today.   Morgan [name] 05/04/2014, 11:07 AM

## 2014-05-05 DIAGNOSIS — E785 Hyperlipidemia, unspecified: Secondary | ICD-10-CM | POA: Insufficient documentation

## 2014-05-05 DIAGNOSIS — R338 Other retention of urine: Secondary | ICD-10-CM | POA: Insufficient documentation

## 2014-05-05 DIAGNOSIS — E038 Other specified hypothyroidism: Secondary | ICD-10-CM | POA: Insufficient documentation

## 2014-05-05 DIAGNOSIS — D329 Benign neoplasm of meninges, unspecified: Secondary | ICD-10-CM

## 2014-05-05 DIAGNOSIS — J438 Other emphysema: Secondary | ICD-10-CM | POA: Insufficient documentation

## 2014-05-05 DIAGNOSIS — F329 Major depressive disorder, single episode, unspecified: Secondary | ICD-10-CM

## 2014-05-05 DIAGNOSIS — M1000 Idiopathic gout, unspecified site: Secondary | ICD-10-CM

## 2014-05-05 LAB — GLUCOSE, CAPILLARY
GLUCOSE-CAPILLARY: 357 mg/dL — AB (ref 70–99)
GLUCOSE-CAPILLARY: 264 mg/dL — AB (ref 70–99)
Glucose-Capillary: 358 mg/dL — ABNORMAL HIGH (ref 70–99)
Glucose-Capillary: 367 mg/dL — ABNORMAL HIGH (ref 70–99)
Glucose-Capillary: 473 mg/dL — ABNORMAL HIGH (ref 70–99)
Glucose-Capillary: 318 mg/dL — ABNORMAL HIGH (ref 70–99)

## 2014-05-05 LAB — BASIC METABOLIC PANEL
Anion gap: 8 (ref 5–15)
BUN: 28 mg/dL — ABNORMAL HIGH (ref 6–23)
CALCIUM: 8.9 mg/dL (ref 8.4–10.5)
CHLORIDE: 102 mmol/L (ref 96–112)
CO2: 24 mmol/L (ref 19–32)
CREATININE: 1.28 mg/dL — AB (ref 0.50–1.10)
GFR calc Af Amer: 47 mL/min — ABNORMAL LOW (ref >90)
GFR calc non Af Amer: 40 mL/min — ABNORMAL LOW (ref >90)
GLUCOSE: 400 mg/dL — AB (ref 70–99)
POTASSIUM: 4.9 mmol/L (ref 3.5–5.1)
SODIUM: 134 mmol/L — AB (ref 135–145)

## 2014-05-05 LAB — CBC
HCT: 34.2 % — ABNORMAL LOW (ref 36.0–46.0)
Hemoglobin: 11.3 g/dL — ABNORMAL LOW (ref 12.0–15.0)
MCH: 30 pg (ref 26.0–34.0)
MCHC: 33 g/dL (ref 30.0–36.0)
MCV: 90.7 fL (ref 78.0–100.0)
PLATELETS: 155 10*3/uL (ref 150–400)
RBC: 3.77 MIL/uL — ABNORMAL LOW (ref 3.87–5.11)
RDW: 12.7 % (ref 11.5–15.5)
WBC: 11.3 10*3/uL — AB (ref 4.0–10.5)

## 2014-05-05 LAB — GLUCOSE, RANDOM: Glucose, Bld: 437 mg/dL — ABNORMAL HIGH (ref 70–99)

## 2014-05-05 MED ORDER — LEVETIRACETAM IN NACL 1500 MG/100ML IV SOLN
1500.0000 mg | Freq: Two times a day (BID) | INTRAVENOUS | Status: DC
  Administered 2014-05-06 – 2014-05-12 (×13): 1500 mg via INTRAVENOUS
  Filled 2014-05-05 (×15): qty 100

## 2014-05-05 MED ORDER — SODIUM CHLORIDE 0.9 % IV SOLN
150.0000 mg | Freq: Two times a day (BID) | INTRAVENOUS | Status: DC
  Administered 2014-05-05 – 2014-05-06 (×2): 150 mg via INTRAVENOUS
  Filled 2014-05-05 (×5): qty 15

## 2014-05-05 MED ORDER — SODIUM CHLORIDE 0.9 % IV SOLN
50.0000 mg | Freq: Once | INTRAVENOUS | Status: AC
  Administered 2014-05-05: 50 mg via INTRAVENOUS
  Filled 2014-05-05: qty 5

## 2014-05-05 MED ORDER — LEVETIRACETAM IN NACL 500 MG/100ML IV SOLN
500.0000 mg | Freq: Once | INTRAVENOUS | Status: AC
  Administered 2014-05-05: 500 mg via INTRAVENOUS
  Filled 2014-05-05: qty 100

## 2014-05-05 MED ORDER — INSULIN ASPART 100 UNIT/ML ~~LOC~~ SOLN
5.0000 [IU] | Freq: Three times a day (TID) | SUBCUTANEOUS | Status: DC
  Administered 2014-05-05 – 2014-05-06 (×4): 5 [IU] via SUBCUTANEOUS

## 2014-05-05 MED ORDER — MORPHINE SULFATE 2 MG/ML IJ SOLN
2.0000 mg | INTRAMUSCULAR | Status: DC | PRN
  Administered 2014-05-05 – 2014-05-06 (×2): 2 mg via INTRAVENOUS
  Filled 2014-05-05 (×2): qty 1

## 2014-05-05 NOTE — Progress Notes (Signed)
Patient ID: Morgan [name], female   DOB: [birth year], 74 y.o.   MRN: [id number] BP 170/55 mmHg  Pulse 56  Temp(Src) 98 F (36.7 C) (Oral)  Resp 20  Ht 5' 4"  (1.626 m)  Wt 104.327 kg (230 lb)  BMI 39.46 kg/m2  SpO2 98% Alert, oriented x 4, though daughter states she is in and out of a state of confusion Following commands Moving all extremities Mild left facial droop perrl Family has decided that they would like surgery during this hospitalization. Will plan on craniotomy Friday of this week.

## 2014-05-05 NOTE — Clinical Documentation Improvement (Signed)
The impression of the H/P states pt with seizures  Clarification Needed  Please further describe the type/cause, acuity, and control status of the seizures for accuracy in capturing the more descriptive form of the condition  1. Please clarify type/cause of seizure: ? Atonic ? Disorder ? Due to stroke ? Epileptic ? Febrile ? Simple ? Complex ? Grand mal 2. Please clarify type/cause of epilepsy: ? Due to external causes (please specify) ? Focal ? Generalized ? Generalized idiopathic ? Juvenile ? Absence ? Myoclonic ? Tonic 3. Please clarify acuity: ? With status epilepticus ? Without status epilepticus ? Other (please specify) ___________________ ? Unable to determine ? Unknown 4.  Please clarify control status: ? Intractable ? Not intractable ? Pharmacoresistant ? Poorly controlled ? Refractory ? Treatment resistant ? Other type (please specify) _______________ ? Unable to determine ? UnknownSEIZURE AND EPILEPSY SPECIFICITY ? Partial ? Simple ? Complex ? Petit mal ? Post-traumatic ? Recurrent ? Other (please specify) ________________ ? Unknown ? Unable to determine ? With complex partial seizures ? With grand mal seizures ? With simple partial seizures ? With spasms ? Other type (please specify) _____________ ? Unable to determine ? Unknown  Thank you, Heloise Beecham ,RN Clinical Documentation Specialist:  [place] [facility] [employer] [facility]

## 2014-05-05 NOTE — Progress Notes (Signed)
Subjective: Continues to have leftward head deviation, preserved consciousness during events.   Exam: Filed Vitals:   05/05/14 0745  BP: 145/57  Pulse: 76  Temp: 98.1 F (36.7 C)  Resp: 13   Gen: In bed, NAD MS: awake, alert, answers questions and follows commnads.  YF:RTMYT, right gaze preference, left field cut.  Motor: 4+/5 in the left arm, 4+/5 in the left leg.  Sensory: intact to LT   Impression: 74 yo F with new onset seizure in the setting of a right frontal meningioma. Her left sided symptoms had started prior to seizure, but she had attributed them "to my bad knee." With continued repeated seizures, will continue to increase her AEDs. Hopefully as edema improves and AEDs are titrated, these will come under control.   Recommendations: 1) Continue keppra 1017m BID 2) continue decadron [id number].  3) increase vimpat to 1509mID 4) Appreciate neurosurgical assistance.   McNRoland RackD [facility] [phone] 7pm- 7am, please page neurology on call as listed in [facility]

## 2014-05-05 NOTE — Progress Notes (Signed)
Paged Dr. Sherral Hammers to notify of pt's high blood pressure systolic [id number] which has been ongoing and pt's blood sugar of 473. Treated with insulin per md orders and ordered stat glucose lab. Will monitor. Pt is asymptomatic of blood sugar and blood pressure.

## 2014-05-05 NOTE — Progress Notes (Signed)
Pt has not voided since 1800 on 1/25. Bladder scan performed to find over 755cc in bladder. MD notified and order received to place a foley catheter. Inserted foley without complications and [id number] of clear, yellow urine was emptied from pt bladder.   Hart Rochester, RN, BSN

## 2014-05-05 NOTE — Progress Notes (Signed)
[name] TEAM 1 - Stepdown/ICU TEAM Progress Note  Morgan [name] JHE:[id number] DOB: 08-30-1940 DOA: 05/03/2014 PCP: Gilford Rile, MD  Admit HPI / Brief Narrative:  74 yo WF PMHx  HTN, DM type 2, left breast cancer on remission for 5 years, COPD, hyperlipidemia, hypothyroidism, and obesity that was brought into [facility] ED by family after developing sudden onset L sided weakness, leftward head deviation, difficulty with speech, and altered mental status. Patient was transferred to [facility] as code stroke. CT head showed evidence of enlarging meningioma, compared to prior study in 2012, with new vasogenic edema and midline shift. MRI brain with no infarct. Neurology was consulted, and symptoms thought to be secondary to seizure from meningioma.  EEG completed showed evidence of partial seizures. Pt is placed on Keppra, Decadron, and Vimpat. Neurosurgery is consulted, with no current indication for emergent surgery.   HPI/Subjective: Pt with recurrent eye deviation to the left, consistent with the partial seizures. Acute urinary rentention overnight.   No other complaints  Assessment/Plan:  Acute encephalopathy -Resolved, A /O X4 -MRI head with no infarct.   Right Frontal Meningioma -Pt with continued partial seizure activity -CT head- enlarging meningioma, new vasogenic edema w/ midline shift -Neurology on board- Continue Keppra, Decadron, and Vimpat -Neurosurgery;no idication for emergent/urgent surgery. -Morphine 2 mg q 4hrPRN pain; if mental status change stop immediately  Depression  -Continue Cymbalta 41m BID  Acute urinary retention -Continue Foley catheter  Diabetes Mellitus type 2, uncontrolled  -CBGs elevated  -Continue Levemir 15U BID, Novolog 5U TID w/ meal; SSI  Peripheral Neuropathy -Continue gabapentin  HTN -Continue Lisinopril, Felodipine, Metoprolol, and  Labetalol PRN  Hyperlipidemia -Continue Zocor 478m GERD -Continue Protonix 4080mBID  COPD -Continue Dulera   Hypothyroidism Continue Synthroid  Gout -no complaints -Continue Urolic 17m18HUily  Obesity  Code Status: FULL Family Communication: Son at bedside Disposition Plan: SDU    Consultants: Dr.McNeill KirMidwifeeurology) Dr.Kyle Cabbell (Neurosurgery)    Procedure/Significant Events: None   Culture None  Antibiotics: None  DVT prophylaxis: SCDs  Devices None   LINES / TUBES:  None    Continuous Infusions: . sodium chloride    . sodium chloride 1,000 mL (05/04/14 1852)    Objective: VITAL SIGNS: Temp: 98 F (36.7 C) (01/26 1257) Temp Source: Oral (01/26 1257) BP: 146/73 mmHg (01/26 1257) Pulse Rate: 60 (01/26 1257) SPO2; FIO2:   Intake/Output Summary (Last 24 hours) at 05/05/14 1426 Last data filed at 05/05/14 1259  Gross per 24 hour  Intake   1782 ml  Output   2655 ml  Net   -873 ml     Exam: General: A/O 4, obese Caucasian female in NAD. On [place] O2 HEENT:  PERRL, right gaze preference Lungs: Clear to auscultation bilaterally without wheezes or crackles Cardiovascular: Regular rate and rhythm without murmur gallop or rub normal S1 and S2 Abdomen: Nontender, nondistended, soft, bowel sounds positive, no rebound, no ascites, no appreciable mass Extremities: 4/5 in left arm and left leg  Data Reviewed: Basic Metabolic Panel:  Recent Labs Lab 05/03/14 1947 05/03/14 1959 05/04/14 0100 05/05/14 0305 05/05/14 0839  NA 130* 131* 135 134*  --   K 4.2 4.2 4.5 4.9  --   CL 96 97 101 102  --   CO2 22  --  29 24  --   GLUCOSE 551* 560* 365* 400* 437*  BUN 24* 26* 22 28*  --   CREATININE 1.38* 1.10 1.32* 1.28*  --  CALCIUM 8.9  --  8.8 8.9  --    Liver Function Tests:  Recent Labs Lab 05/03/14 1947  AST 67*  ALT 54*  ALKPHOS 165*  BILITOT 0.7  PROT 7.3  ALBUMIN 3.2*   No results for input(s): LIPASE, AMYLASE in the last 168 hours. No results for input(s): AMMONIA in the last 168  hours. CBC:  Recent Labs Lab 05/03/14 1947 05/03/14 1959 05/04/14 0100 05/05/14 0305  WBC 6.7  --  9.9 11.3*  NEUTROABS 3.7  --   --   --   HGB 12.2 13.3 12.7 11.3*  HCT 36.1 39.0 37.6 34.2*  MCV 88.7  --  91.0 90.7  PLT 171  --  168 155   Cardiac Enzymes: No results for input(s): CKTOTAL, CKMB, CKMBINDEX, TROPONINI in the last 168 hours. BNP (last 3 results) No results for input(s): PROBNP in the last 8760 hours. CBG:  Recent Labs Lab 05/04/14 1645 05/04/14 2203 05/04/14 2250 05/05/14 0742 05/05/14 1255  GLUCAP 293* 358* 367* 473* 357*    Recent Results (from the past 240 hour(s))  Blood culture (routine x 2)     Status: None (Preliminary result)   Collection Time: 05/03/14  8:23 PM  Result Value Ref Range Status   Specimen Description BLOOD RIGHT FOREARM  Final   Special Requests BOTTLES DRAWN AEROBIC AND ANAEROBIC 5CC  Final   Culture   Final           BLOOD CULTURE RECEIVED NO GROWTH TO DATE CULTURE WILL BE HELD FOR 5 DAYS BEFORE ISSUING A FINAL NEGATIVE REPORT Performed at [facility]    Report Status PENDING  Incomplete  Blood culture (routine x 2)     Status: None (Preliminary result)   Collection Time: 05/03/14  8:28 PM  Result Value Ref Range Status   Specimen Description BLOOD RIGHT HAND  Final   Special Requests BOTTLES DRAWN AEROBIC AND ANAEROBIC 5CC  Final   Culture   Final           BLOOD CULTURE RECEIVED NO GROWTH TO DATE CULTURE WILL BE HELD FOR 5 DAYS BEFORE ISSUING A FINAL NEGATIVE REPORT Performed at [facility]    Report Status PENDING  Incomplete  MRSA PCR Screening     Status: None   Collection Time: 05/04/14 12:48 AM  Result Value Ref Range Status   MRSA by PCR NEGATIVE NEGATIVE Final    Comment:        The GeneXpert MRSA Assay (FDA approved for NASAL specimens only), is one component of a comprehensive MRSA colonization surveillance program. It is not intended to diagnose MRSA infection nor to guide or monitor  treatment for MRSA infections.      Studies:  Recent x-ray studies have been reviewed in detail by the Attending Physician  Scheduled Meds:  Scheduled Meds: . antiseptic oral rinse  7 mL Mouth Rinse BID  . dexamethasone  4 mg Intravenous 4 times per day  . DULoxetine  60 mg Oral BID  . febuxostat  40 mg Oral Daily  . felodipine  2.5 mg Oral Daily  . gabapentin  100 mg Oral Daily  . haloperidol lactate  2 mg Intravenous Once  . insulin aspart  0-20 Units Subcutaneous TID WC  . insulin aspart  5 Units Subcutaneous TID WC  . insulin detemir  15 Units Subcutaneous BID  . lacosamide (VIMPAT) IV  150 mg Intravenous Q12H  . levETIRAcetam  1,000 mg Intravenous Q12H  . levothyroxine  200 mcg Oral Once per day on Mon Tue Wed Thu Fri Sat   And  . [START ON 05/10/2014] levothyroxine  300 mcg Oral Once per day on Sun  . lisinopril  40 mg Oral Daily  . LORazepam  0.5 mg Oral QHS  . metoprolol succinate  100 mg Oral Daily  . mometasone-formoterol  2 puff Inhalation BID  . pantoprazole  40 mg Oral BID  . simvastatin  40 mg Oral QHS    Time spent on care of this patient: 40 mins   Lacy Duverney , [facility]    [facility] Office  [phone] Pager - [phone] On-Call/Text Page:      [email or web address]      password [email or web address]  If 7PM-7AM, please contact night-coverage [email or web address] Password [email or web address] 05/05/2014, 2:26 PM   LOS: 2 days     Examined Patient and discussed A&P with ANP Sahar and agree with above plan.  I have reviewed the entire database. I have made any necessary editorial changes, and agree with its content. I have reviewed this patient's available data, including medical history, events of note, physical examination, radiology studies and test results as part of my evaluation Pt with Multiple Complex medical problems> 35 min spent in direct Pt care   Dia Crawford, MD [facility] [phone] pager

## 2014-05-06 LAB — BASIC METABOLIC PANEL
ANION GAP: 9 (ref 5–15)
Anion gap: 3 — ABNORMAL LOW (ref 5–15)
BUN: 30 mg/dL — ABNORMAL HIGH (ref 6–23)
BUN: 30 mg/dL — AB (ref 6–23)
CHLORIDE: 102 mmol/L (ref 96–112)
CO2: 27 mmol/L (ref 19–32)
CO2: 19 mmol/L (ref 19–32)
Calcium: 8.3 mg/dL — ABNORMAL LOW (ref 8.4–10.5)
Calcium: 8.3 mg/dL — ABNORMAL LOW (ref 8.4–10.5)
Chloride: 99 mmol/L (ref 96–112)
Creatinine, Ser: 1.09 mg/dL (ref 0.50–1.10)
Creatinine, Ser: 1.2 mg/dL — ABNORMAL HIGH (ref 0.50–1.10)
GFR calc Af Amer: 51 mL/min — ABNORMAL LOW (ref >90)
GFR calc non Af Amer: 49 mL/min — ABNORMAL LOW (ref >90)
GFR, EST AFRICAN AMERICAN: 57 mL/min — AB (ref >90)
GFR, EST NON AFRICAN AMERICAN: 44 mL/min — AB (ref >90)
Glucose, Bld: 285 mg/dL — ABNORMAL HIGH (ref 70–99)
Glucose, Bld: 350 mg/dL — ABNORMAL HIGH (ref 70–99)
POTASSIUM: 4.3 mmol/L (ref 3.5–5.1)
Potassium: 4.5 mmol/L (ref 3.5–5.1)
Sodium: 129 mmol/L — ABNORMAL LOW (ref 135–145)
Sodium: 130 mmol/L — ABNORMAL LOW (ref 135–145)

## 2014-05-06 LAB — CBC
HCT: 33.2 % — ABNORMAL LOW (ref 36.0–46.0)
Hemoglobin: 11.1 g/dL — ABNORMAL LOW (ref 12.0–15.0)
MCH: 29.8 pg (ref 26.0–34.0)
MCHC: 33.4 g/dL (ref 30.0–36.0)
MCV: 89 fL (ref 78.0–100.0)
PLATELETS: 175 10*3/uL (ref 150–400)
RBC: 3.73 MIL/uL — AB (ref 3.87–5.11)
RDW: 12.6 % (ref 11.5–15.5)
WBC: 10.1 10*3/uL (ref 4.0–10.5)

## 2014-05-06 LAB — GLUCOSE, CAPILLARY
GLUCOSE-CAPILLARY: 286 mg/dL — AB (ref 70–99)
Glucose-Capillary: 330 mg/dL — ABNORMAL HIGH (ref 70–99)
Glucose-Capillary: 305 mg/dL — ABNORMAL HIGH (ref 70–99)
Glucose-Capillary: 269 mg/dL — ABNORMAL HIGH (ref 70–99)

## 2014-05-06 MED ORDER — CLONIDINE HCL 0.2 MG PO TABS
0.2000 mg | ORAL_TABLET | Freq: Two times a day (BID) | ORAL | Status: DC
  Administered 2014-05-06 – 2014-05-08 (×3): 0.2 mg via ORAL
  Filled 2014-05-06 (×5): qty 1

## 2014-05-06 MED ORDER — SENNA 8.6 MG PO TABS
1.0000 | ORAL_TABLET | Freq: Every day | ORAL | Status: DC | PRN
  Administered 2014-05-06 – 2014-05-07 (×2): 8.6 mg via ORAL
  Filled 2014-05-06 (×3): qty 1

## 2014-05-06 MED ORDER — INSULIN ASPART 100 UNIT/ML ~~LOC~~ SOLN
10.0000 [IU] | Freq: Three times a day (TID) | SUBCUTANEOUS | Status: DC
  Administered 2014-05-06 – 2014-05-07 (×3): 10 [IU] via SUBCUTANEOUS

## 2014-05-06 MED ORDER — LACOSAMIDE 200 MG/20ML IV SOLN
200.0000 mg | Freq: Two times a day (BID) | INTRAVENOUS | Status: DC
  Administered 2014-05-06 – 2014-05-29 (×46): 200 mg via INTRAVENOUS
  Filled 2014-05-06 (×92): qty 20

## 2014-05-06 MED ORDER — LORAZEPAM 2 MG/ML IJ SOLN
1.0000 mg | Freq: Four times a day (QID) | INTRAMUSCULAR | Status: DC | PRN
  Administered 2014-05-07: 1 mg via INTRAVENOUS
  Filled 2014-05-06 (×2): qty 1

## 2014-05-06 MED ORDER — VITAMIN B-12 1000 MCG PO TABS
1000.0000 ug | ORAL_TABLET | Freq: Every day | ORAL | Status: DC
  Administered 2014-05-06 – 2014-05-08 (×3): 1000 ug via ORAL
  Filled 2014-05-06 (×3): qty 1

## 2014-05-06 MED ORDER — ACETAMINOPHEN 325 MG PO TABS
650.0000 mg | ORAL_TABLET | Freq: Four times a day (QID) | ORAL | Status: DC | PRN
  Administered 2014-05-08 – 2014-05-13 (×2): 650 mg via ORAL
  Filled 2014-05-06 (×2): qty 2

## 2014-05-06 MED ORDER — INSULIN DETEMIR 100 UNIT/ML ~~LOC~~ SOLN
20.0000 [IU] | Freq: Two times a day (BID) | SUBCUTANEOUS | Status: DC
Start: 2014-05-06 — End: 2014-05-06

## 2014-05-06 MED ORDER — ADULT MULTIVITAMIN W/MINERALS CH
1.0000 | ORAL_TABLET | Freq: Every day | ORAL | Status: DC
  Administered 2014-05-06 – 2014-05-08 (×3): 1 via ORAL
  Filled 2014-05-06 (×3): qty 1

## 2014-05-06 MED ORDER — COLCHICINE 0.6 MG PO TABS
0.6000 mg | ORAL_TABLET | Freq: Two times a day (BID) | ORAL | Status: DC | PRN
  Filled 2014-05-06: qty 1

## 2014-05-06 MED ORDER — CLONIDINE HCL 0.3 MG PO TABS: 0.3000 mg | ORAL_TABLET | Freq: Two times a day (BID) | ORAL | Status: DC

## 2014-05-06 MED ORDER — ANASTROZOLE 1 MG PO TABS
1.0000 mg | ORAL_TABLET | Freq: Every day | ORAL | Status: DC
  Administered 2014-05-07 – 2014-05-29 (×18): 1 mg via ORAL
  Filled 2014-05-06 (×29): qty 1

## 2014-05-06 MED ORDER — INSULIN DETEMIR 100 UNIT/ML ~~LOC~~ SOLN
22.0000 [IU] | Freq: Two times a day (BID) | SUBCUTANEOUS | Status: DC
Start: 2014-05-06 — End: 2014-05-08
  Administered 2014-05-06 – 2014-05-08 (×4): 22 [IU] via SUBCUTANEOUS
  Filled 2014-05-06 (×5): qty 0.22

## 2014-05-06 NOTE — Progress Notes (Signed)
Patient appears to having multiple seizures this evening. Daughter has noticed her continuously looking over to the left side and gazing. Upon entrance to room multiple times, patient does appear to be gazing off to the left with her head turned. Otherwise, neuro intact. Patient following all commands besides when asked to follow the penlight with her eyes. Keppra dose completed this evening and Vimpat currently infusing. Patient complains of 10/10 sudden sharp headache. Prn morphine given. BP also elevated at 205/53. Prn labetalol given. Family very concerned about frequent seizure like activity. [facility] NP notified and orders to notify neurology. Dr. Janann Colonel updated on neuro status and seizure activity. New orders received. Will continue to monitor and notify MD as needed.   M.Forest Gleason, RN

## 2014-05-06 NOTE — Progress Notes (Signed)
[place] TEAM 1 - Stepdown/ICU TEAM Progress Note  Morgan [name] UEA:[id number] DOB: 28-Dec-1940 DOA: 05/03/2014 PCP: Gilford Rile, MD  Admit HPI / Brief Narrative:  74 yo F Hx  HTN, DM type 2, left breast cancer in remission for 5 years, COPD, hyperlipidemia, hypothyroidism, and obesity who was taken to [facility] ED by family after developing sudden onset L sided weakness, leftward head deviation, difficulty with speech, and altered mental status. Patient was transferred to [facility] as a code stroke. CT head showed evidence of enlarging meningioma, compared to prior study in 2012, with new vasogenic edema and midline shift. MRI brain with no infarct. Neurology was consulted, and symptoms thought to be secondary to seizure from meningioma.  EEG completed showed evidence of partial seizures. Pt was placed on Keppra, Decadron, and Vimpat. Neurosurgery was consulted.   HPI/Subjective: Denies any complaints. Pt with recurrent seizure activity overnight.  She is currently alert and conversant, though mildly groggy from pain meds.    Assessment/Plan:  Acute encephalopathy -due to meningioma and seizure  -MRI head with no infarct  Right Frontal Meningioma -Pt with recurrent seizure activity overnight, given additional Keppra per Neurology -CT head - enlarging meningioma, new vasogenic edema w/ midline shift -Neurology on board- Continue Keppra, Decadron, Vimpat increased to 267m, ativan PRN for additional seizure activity -Neurosurgery - family/pt agreable to plans for craniotomy Friday 05/08/14 -Morphine 2 mg q 4hr PRN pain -Protonix for GI prophy on high dose steroid   Hyponatremia -Na 129- likely due to hyperglycemia worsened by steroids -Continue NS IVF  -Repeat BMET in am  Diabetes Mellitus type 2, uncontrolled  -CBGs elevated- worsened by steroids -Will inc Levemir, Novolog TID w/ meal; resistant SSI  Peripheral Neuropathy -Continue gabapentin  Depression  -Continue  Cymbalta 659mBID  Acute urinary retention -Continue Foley catheter  HTN -Continue Lisinopril, Felodipine, Metoprolol, and Labetalol PRN  Hyperlipidemia -Continue Zocor 4033mGERD -Continue Protonix 49m59mD   COPD -Continue Dulera   Hypothyroidism -Continue Synthroid  Gout -no complaints -Continue Urolic 49mg47WGly  Obesity - Body mass index is 40.74 kg/(m^2).  Code Status: FULL Family Communication: Daughter at bedside Disposition Plan: SDU  Consultants: Dr.McNeill KirkLeonel Ramsayurology) Dr.Kyle Cabbell (Neurosurgery)   Procedure/Significant Events: Craniotomy 05/08/2014  Antibiotics: None  DVT prophylaxis: SCDs  Objective: Blood pressure 168/74, pulse 59, temperature 98.6 F (37 C), temperature source Oral, resp. rate 26, height 5' 4"  (1.626 m), weight 107.7 kg (237 lb 7 oz), SpO2 98 %.  Intake/Output Summary (Last 24 hours) at 05/06/14 1502 Last data filed at 05/06/14 1221  Gross per 24 hour  Intake    925 ml  Output   2375 ml  Net  -1450 ml   Exam: General: A/O 3, female in NAD - [place] O2 HEENT:  PERRL Lungs: Clear to auscultation bilaterally without wheezes or crackles Cardiovascular: Regular rate and rhythm without murmur gallop or rub normal S1 and S2 Abdomen: obese,nontender, nondistended, soft, bowel sounds positive, no rebound, no ascites, no appreciable mass Extremities: no signif cyanosis or clubbing - trace B LE edema   Data Reviewed: Basic Metabolic Panel:  Recent Labs Lab 05/03/14 1947 05/03/14 1959 05/04/14 0100 05/05/14 0305 05/05/14 0839 05/06/14 0256  NA 130* 131* 135 134*  --  129*  K 4.2 4.2 4.5 4.9  --  4.3  CL 96 97 101 102  --  99  CO2 22  --  29 24  --  27  GLUCOSE 551* 560* 365* 400* 437* 285*  BUN 24* 26* 22 28*  --  30*  CREATININE 1.38* 1.10 1.32* 1.28*  --  1.09  CALCIUM 8.9  --  8.8 8.9  --  8.3*   Liver Function Tests:  Recent Labs Lab 05/03/14 1947  AST 67*  ALT 54*  ALKPHOS 165*  BILITOT 0.7    PROT 7.3  ALBUMIN 3.2*   CBC:  Recent Labs Lab 05/03/14 1947 05/03/14 1959 05/04/14 0100 05/05/14 0305 05/06/14 0256  WBC 6.7  --  9.9 11.3* 10.1  NEUTROABS 3.7  --   --   --   --   HGB 12.2 13.3 12.7 11.3* 11.1*  HCT 36.1 39.0 37.6 34.2* 33.2*  MCV 88.7  --  91.0 90.7 89.0  PLT 171  --  168 155 175   CBG:  Recent Labs Lab 05/05/14 1255 05/05/14 1728 05/05/14 2225 05/06/14 0748 05/06/14 1213  GLUCAP 357* 318* 264* 330* 305*    Recent Results (from the past 240 hour(s))  Blood culture (routine x 2)     Status: None (Preliminary result)   Collection Time: 05/03/14  8:23 PM  Result Value Ref Range Status   Specimen Description BLOOD RIGHT FOREARM  Final   Special Requests BOTTLES DRAWN AEROBIC AND ANAEROBIC 5CC  Final   Culture   Final           BLOOD CULTURE RECEIVED NO GROWTH TO DATE CULTURE WILL BE HELD FOR 5 DAYS BEFORE ISSUING A FINAL NEGATIVE REPORT Performed at [facility]    Report Status PENDING  Incomplete  Blood culture (routine x 2)     Status: None (Preliminary result)   Collection Time: 05/03/14  8:28 PM  Result Value Ref Range Status   Specimen Description BLOOD RIGHT HAND  Final   Special Requests BOTTLES DRAWN AEROBIC AND ANAEROBIC 5CC  Final   Culture   Final           BLOOD CULTURE RECEIVED NO GROWTH TO DATE CULTURE WILL BE HELD FOR 5 DAYS BEFORE ISSUING A FINAL NEGATIVE REPORT Performed at [facility]    Report Status PENDING  Incomplete  MRSA PCR Screening     Status: None   Collection Time: 05/04/14 12:48 AM  Result Value Ref Range Status   MRSA by PCR NEGATIVE NEGATIVE Final    Comment:        The GeneXpert MRSA Assay (FDA approved for NASAL specimens only), is one component of a comprehensive MRSA colonization surveillance program. It is not intended to diagnose MRSA infection nor to guide or monitor treatment for MRSA infections.      Studies:  Recent x-ray studies have been reviewed in detail by the  Attending Physician  Scheduled Meds:  Scheduled Meds: . antiseptic oral rinse  7 mL Mouth Rinse BID  . dexamethasone  4 mg Intravenous 4 times per day  . DULoxetine  60 mg Oral BID  . febuxostat  40 mg Oral Daily  . felodipine  2.5 mg Oral Daily  . gabapentin  100 mg Oral Daily  . haloperidol lactate  2 mg Intravenous Once  . insulin aspart  0-20 Units Subcutaneous TID WC  . insulin aspart  5 Units Subcutaneous TID WC  . insulin detemir  15 Units Subcutaneous BID  . lacosamide (VIMPAT) IV  200 mg Intravenous Q12H  . levETIRAcetam  1,500 mg Intravenous Q12H  . levothyroxine  200 mcg Oral Once per day on Mon Tue Wed Thu Fri Sat   And  . [  START ON 05/10/2014] levothyroxine  300 mcg Oral Once per day on Sun  . lisinopril  40 mg Oral Daily  . LORazepam  0.5 mg Oral QHS  . metoprolol succinate  100 mg Oral Daily  . mometasone-formoterol  2 puff Inhalation BID  . pantoprazole  40 mg Oral BID  . simvastatin  40 mg Oral QHS    Time spent on care of this patient: 35 mins   Lacy Duverney , [facility]    [facility] Office  [phone] Pager - [phone] On-Call/Text Page:      [email or web address]      password [email or web address]  If 7PM-7AM, please contact night-coverage [email or web address] Password [email or web address] 05/06/2014, 3:02 PM   LOS: 3 days   I have personally examined this patient and reviewed the entire database. I have reviewed the above note, made any necessary editorial changes, and agree with its content.  Cherene Altes, MD [facility]

## 2014-05-06 NOTE — Progress Notes (Signed)
Inpatient Diabetes Program Recommendations  AACE/ADA: New Consensus Statement on Inpatient Glycemic Control (2013)  Target Ranges:  Prepandial:   less than 140 mg/dL      Peak postprandial:   less than 180 mg/dL (1-2 hours)      Critically ill patients:  140 - 180 mg/dL  Results for Morgan [name], Morgan [name] (MRN [id number]) as of 05/06/2014 10:25  Ref. Range 05/05/2014 07:42 05/05/2014 12:55 05/05/2014 17:28 05/05/2014 22:25 05/06/2014 07:48  Glucose-Capillary Latest Range: 70-99 mg/dL 473 (H) 357 (H) 318 (H) 264 (H) 330 (H)   Inpatient Diabetes Program Recommendations Insulin - Basal: Increase Lantus to 25 units BID  Pt takes U-500 at home.  Consider using home regimen if U-500 available.   Thank you  Raoul Pitch BSN, RN,CDE Inpatient Diabetes Coordinator [phone] (team pager)

## 2014-05-06 NOTE — Progress Notes (Signed)
Subjective: Was well controlled for most of the day yesterday, but had flurry of 7 - 8 sz yesterday evening. Some confusion  Exam: Filed Vitals:   05/06/14 1216  BP: 168/74  Pulse: 59  Temp: 98.6 F (37 C)  Resp: 26   Gen: In bed, NAD MS: awake, alert, answers questions and follows commnads.  KD:TOIZT, right gaze preference, left field cut.  Motor: 5-/5 in the left arm, 5-/5 in the left leg.  Sensory: intact to LT   Impression: 74 yo F with new onset seizure in the setting of a right frontal meningioma. Her left sided symptoms had started prior to seizure, but she had attributed them "to my bad knee." With continued repeated seizures, will continue to increase her AEDs. Hopefully as edema improves and AEDs are titrated, these will come under control. I suspect confusion multifactoral including frequent sz, steroids, hospitalization, brain mass.   Recommendations: 1) Continue keppra 1053m BID 2) continue decadron 46mq6h.  3) increase vimpat to 20018mID 4) Appreciate neurosurgical assistance.  5) ativan PRN seizure flurry  McNRoland RackD [facility] [phone] 7pm- 7am, please page neurology on call as listed in [place]

## 2014-05-07 ENCOUNTER — Inpatient Hospital Stay (HOSPITAL_COMMUNITY): Payer: Medicare Other

## 2014-05-07 ENCOUNTER — Encounter (HOSPITAL_COMMUNITY): Payer: Self-pay | Admitting: *Deleted

## 2014-05-07 ENCOUNTER — Other Ambulatory Visit (HOSPITAL_COMMUNITY): Payer: Self-pay | Admitting: Neurosurgery

## 2014-05-07 DIAGNOSIS — E871 Hypo-osmolality and hyponatremia: Secondary | ICD-10-CM

## 2014-05-07 DIAGNOSIS — K219 Gastro-esophageal reflux disease without esophagitis: Secondary | ICD-10-CM

## 2014-05-07 DIAGNOSIS — G629 Polyneuropathy, unspecified: Secondary | ICD-10-CM

## 2014-05-07 LAB — CBC WITH DIFFERENTIAL/PLATELET
BASOS ABS: 0 10*3/uL (ref 0.0–0.1)
BASOS PCT: 0 % (ref 0–1)
EOS PCT: 0 % (ref 0–5)
Eosinophils Absolute: 0 10*3/uL (ref 0.0–0.7)
HCT: 37.5 % (ref 36.0–46.0)
Hemoglobin: 12.9 g/dL (ref 12.0–15.0)
LYMPHS PCT: 19 % (ref 12–46)
Lymphs Abs: 1.6 10*3/uL (ref 0.7–4.0)
MCH: 30.3 pg (ref 26.0–34.0)
MCHC: 34.4 g/dL (ref 30.0–36.0)
MCV: 88 fL (ref 78.0–100.0)
MONO ABS: 0.4 10*3/uL (ref 0.1–1.0)
MONOS PCT: 5 % (ref 3–12)
NEUTROS ABS: 6.2 10*3/uL (ref 1.7–7.7)
NEUTROS PCT: 76 % (ref 43–77)
PLATELETS: 185 10*3/uL (ref 150–400)
RBC: 4.26 MIL/uL (ref 3.87–5.11)
RDW: 12.6 % (ref 11.5–15.5)
WBC: 8.2 10*3/uL (ref 4.0–10.5)

## 2014-05-07 LAB — COMPREHENSIVE METABOLIC PANEL
ALK PHOS: 120 U/L — AB (ref 39–117)
ALT: 47 U/L — ABNORMAL HIGH (ref 0–35)
AST: 47 U/L — AB (ref 0–37)
Albumin: 2.6 g/dL — ABNORMAL LOW (ref 3.5–5.2)
Anion gap: 6 (ref 5–15)
BUN: 29 mg/dL — ABNORMAL HIGH (ref 6–23)
CHLORIDE: 103 mmol/L (ref 96–112)
CO2: 24 mmol/L (ref 19–32)
Calcium: 8.4 mg/dL (ref 8.4–10.5)
Creatinine, Ser: 1.09 mg/dL (ref 0.50–1.10)
GFR calc Af Amer: 57 mL/min — ABNORMAL LOW (ref >90)
GFR, EST NON AFRICAN AMERICAN: 49 mL/min — AB (ref >90)
Glucose, Bld: 246 mg/dL — ABNORMAL HIGH (ref 70–99)
Potassium: 4.3 mmol/L (ref 3.5–5.1)
Sodium: 133 mmol/L — ABNORMAL LOW (ref 135–145)
TOTAL PROTEIN: 5.9 g/dL — AB (ref 6.0–8.3)
Total Bilirubin: 0.7 mg/dL (ref 0.3–1.2)

## 2014-05-07 LAB — GLUCOSE, CAPILLARY
GLUCOSE-CAPILLARY: 256 mg/dL — AB (ref 70–99)
GLUCOSE-CAPILLARY: 231 mg/dL — AB (ref 70–99)
GLUCOSE-CAPILLARY: 199 mg/dL — AB (ref 70–99)
Glucose-Capillary: 214 mg/dL — ABNORMAL HIGH (ref 70–99)
Glucose-Capillary: 198 mg/dL — ABNORMAL HIGH (ref 70–99)

## 2014-05-07 LAB — PROTIME-INR
INR: 1.11 (ref 0.00–1.49)
Prothrombin Time: 14.4 seconds (ref 11.6–15.2)

## 2014-05-07 LAB — TYPE AND SCREEN
ABO/RH(D): A POS
Antibody Screen: NEGATIVE

## 2014-05-07 LAB — APTT: aPTT: 23 seconds — ABNORMAL LOW (ref 24–37)

## 2014-05-07 MED ORDER — SODIUM CHLORIDE 0.9 % IV SOLN
1500.0000 mg | INTRAVENOUS | Status: AC
  Administered 2014-05-08: 1500 mg via INTRAVENOUS
  Filled 2014-05-07 (×2): qty 1500

## 2014-05-07 MED ORDER — DIAZEPAM 5 MG PO TABS
10.0000 mg | ORAL_TABLET | ORAL | Status: AC
  Administered 2014-05-08: 10 mg via ORAL
  Filled 2014-05-07: qty 2

## 2014-05-07 NOTE — Progress Notes (Signed)
Pt woke up 3x between 0100 and 0200 pulling off her gown, wires, and trying to get out of bed saying, "I need to go to my bed".  Pt stated that she thought she was at home.  Son was asking if she could receive anything to help her sleep. After reorienting, repositioning, and comforting the patient after each episode of confusion RN gave 1mg  of PRN ativan @ 0150 to help her stay calm.

## 2014-05-07 NOTE — Progress Notes (Signed)
Patient ID: Morgan [name], female   DOB: 1940/09/12, 74 y.o.   MRN: [id number]  BP 154/84 mmHg  Pulse 65  Temp(Src) 97.8 F (36.6 C) (Oral)  Resp 18  Ht 5' 4"  (1.626 m)  Wt 108.4 kg (238 lb 15.7 oz)  BMI 41.00 kg/m2  SpO2 99% Lethargic, following commands Perrl, full eom Moving all extremities I have spoken at length with [name] and her family. I have recommended a craniotomy for tumor resection due to the recent seizure, and the edema surrounding the mass. Compared to a film in 2012 when there was no edema, there is some slight mass effect at this time. Risks including but not limited to stroke, coma, death, brain damage, paralysis, change in cognition, change in personality, change in sensation, inability to resect the tumor, inability to remove all of the tumor, bleeding, infection, need for further surgery, seizures, and other risks were discussed in detail. Both [name] and her family had their questions answered, and wish to proceed.

## 2014-05-07 NOTE — Progress Notes (Signed)
Inpatient Diabetes Program Recommendations  AACE/ADA: New Consensus Statement on Inpatient Glycemic Control (2013)  Target Ranges:  Prepandial:   less than 140 mg/dL      Peak postprandial:   less than 180 mg/dL (1-2 hours)      Critically ill patients:  140 - 180 mg/dL   Reason for Assessment:  Results for Morgan [name], Morgan [name] (MRN [id number]) as of 05/07/2014 09:46  Ref. Range 05/06/2014 07:48 05/06/2014 12:13 05/06/2014 17:29 05/06/2014 21:41 05/07/2014 08:19  Glucose-Capillary Latest Range: 70-99 mg/dL 330 (H) 305 (H) 269 (H) 286 (H) 256 (H)   Diabetes history: Type 2 diabetes Outpatient Diabetes medications: U500 insulin Current orders for Inpatient glycemic control:  Please consider increasing Levemir to 30 units bid.  Also consider increasing resistant correction to q 4 hours while on Decadron.   Thanks, Adah Perl, RN, BC-ADM Inpatient Diabetes Coordinator Pager [phone]

## 2014-05-07 NOTE — Progress Notes (Signed)
Morgan [name] TEAM 1 - Stepdown/ICU TEAM Progress Note  Morgan [name] NID:[id number] DOB: 02-10-1941 DOA: 05/03/2014 PCP: Gilford Rile, MD  Admit HPI / Brief Narrative: 74 yo F Hx  HTN, DM type 2, left breast cancer in remission for 5 years, COPD, hyperlipidemia, hypothyroidism, and obesity who was taken to [facility] ED by family after developing sudden onset L sided weakness, leftward head deviation, difficulty with speech, and altered mental status. Patient was transferred to [facility] as a code stroke. CT head showed evidence of enlarging meningioma, compared to prior study in 2012, with new vasogenic edema and midline shift. MRI brain with no infarct. Neurology was consulted, and symptoms thought to be secondary to seizure from meningioma.  EEG completed showed evidence of partial seizures. Pt was placed on Keppra, Decadron, and Vimpat. Neurosurgery was consulted and plans for craniotomy 05/08/14.   HPI/Subjective: Denies any complaints, states she is sleepy. No recurrent seizure activity per son  Assessment/Plan: Acute encephalopathy -due to meningioma and seizure   -MRI head with no infarct  Right Frontal Meningioma -Pt with recurrent seizure activity overnight, given additional Keppra per Neurology -CT head - enlarging meningioma, new vasogenic edema w/ midline shift -Neurology on board- Continue Keppra, Decadron, Vimpat increased to 216m, ativan PRN for additional seizure activity -Neurosurgery - family/pt agreable to plans for craniotomy Friday 05/08/14 -Morphine 2 mg q 4hr PRN pain -Protonix for GI prophy on high dose steroid   Hyponatremia -Na 133- likely due to hyperglycemia worsened by steroids -Continue NS IVF ; inc Levemir to 22U, Novolog and resistant SSI -Repeat BMET in am   Diabetes Mellitus type 2, uncontrolled  -CBGs stable- -Continue Levemir 22U BID, Novolog TID w/ meal; resistant SSI  Peripheral Neuropathy -Continue gabapentin  Depression  -Continue Cymbalta  674mBID  Acute urinary retention -Continue Foley catheter  HTN -Continue Lisinopril, Felodipine, Metoprolol, and Labetalol PRN  Hyperlipidemia -Continue Zocor 4051mGERD -Continue Protonix 37m47mD   COPD -Continue Dulera   Hypothyroidism -Continue Synthroid  Gout -no complaints -Continue Urolic 37mg14ERly  Hx Breast CA -resume Anastrazole daily   Obesity BMI 40.74     Code Status: FULL Family Communication: Son at bedside Disposition Plan: SDU    Consultants: Neurosurgery- CabbBarclayrology- KirkLeonel Ramsayocedure/Significant Events: Craniotomy 05/08/14   Culture Blood- no growth to date  Antibiotics: None  DVT prophylaxis: SCDs   Devices None  LINES / TUBES:  None    Continuous Infusions: . sodium chloride 75 mL/hr at 05/07/14 0155    Objective: VITAL SIGNS: Temp: 97.8 F (36.6 C) (01/28 1705) Temp Source: Oral (01/28 1705) BP: 154/84 mmHg (01/28 1705) Pulse Rate: 65 (01/28 1705) SPO2; FIO2:   Intake/Output Summary (Last 24 hours) at 05/07/14 1722 Last data filed at 05/07/14 1700  Gross per 24 hour  Intake   1975 ml  Output   2460 ml  Net   -485 ml     Exam: General: No acute respiratory distress Lungs: Clear to auscultation bilaterally without wheezes or crackles Cardiovascular: Regular rate and rhythm without murmur gallop or rub normal S1 and S2 Abdomen: Nontender, nondistended, soft, bowel sounds positive, no rebound, no ascites, no appreciable mass Extremities: No significant cyanosis, clubbing, or edema bilateral lower extremities  Data Reviewed: Basic Metabolic Panel:  Recent Labs Lab 05/04/14 0100 05/05/14 0305 05/05/14 0839 05/06/14 0256 05/06/14 1500 05/07/14 0435  NA 135 134*  --  129* 130* 133*  K 4.5 4.9  --  4.3 4.5 4.3  CL 101  102  --  99 102 103  CO2 29 24  --  27 19 24   GLUCOSE 365* 400* 437* 285* 350* 246*  BUN 22 28*  --  30* 30* 29*  CREATININE 1.32* 1.28*  --  1.09 1.20* 1.09    CALCIUM 8.8 8.9  --  8.3* 8.3* 8.4   Liver Function Tests:  Recent Labs Lab 05/03/14 1947 05/07/14 0435  AST 67* 47*  ALT 54* 47*  ALKPHOS 165* 120*  BILITOT 0.7 0.7  PROT 7.3 5.9*  ALBUMIN 3.2* 2.6*   No results for input(s): LIPASE, AMYLASE in the last 168 hours. No results for input(s): AMMONIA in the last 168 hours. CBC:  Recent Labs Lab 05/03/14 1947 05/03/14 1959 05/04/14 0100 05/05/14 0305 05/06/14 0256  WBC 6.7  --  9.9 11.3* 10.1  NEUTROABS 3.7  --   --   --   --   HGB 12.2 13.3 12.7 11.3* 11.1*  HCT 36.1 39.0 37.6 34.2* 33.2*  MCV 88.7  --  91.0 90.7 89.0  PLT 171  --  168 155 175   Cardiac Enzymes: No results for input(s): CKTOTAL, CKMB, CKMBINDEX, TROPONINI in the last 168 hours. BNP (last 3 results) No results for input(s): PROBNP in the last 8760 hours. CBG:  Recent Labs Lab 05/06/14 2141 05/07/14 0819 05/07/14 1001 05/07/14 1257 05/07/14 1708  GLUCAP 286* 256* 214* 231* 198*    Recent Results (from the past 240 hour(s))  Blood culture (routine x 2)     Status: None (Preliminary result)   Collection Time: 05/03/14  8:23 PM  Result Value Ref Range Status   Specimen Description BLOOD RIGHT FOREARM  Final   Special Requests BOTTLES DRAWN AEROBIC AND ANAEROBIC 5CC  Final   Culture   Final           BLOOD CULTURE RECEIVED NO GROWTH TO DATE CULTURE WILL BE HELD FOR 5 DAYS BEFORE ISSUING A FINAL NEGATIVE REPORT Performed at [facility]    Report Status PENDING  Incomplete  Blood culture (routine x 2)     Status: None (Preliminary result)   Collection Time: 05/03/14  8:28 PM  Result Value Ref Range Status   Specimen Description BLOOD RIGHT HAND  Final   Special Requests BOTTLES DRAWN AEROBIC AND ANAEROBIC 5CC  Final   Culture   Final           BLOOD CULTURE RECEIVED NO GROWTH TO DATE CULTURE WILL BE HELD FOR 5 DAYS BEFORE ISSUING A FINAL NEGATIVE REPORT Performed at [facility]    Report Status PENDING  Incomplete  MRSA  PCR Screening     Status: None   Collection Time: 05/04/14 12:48 AM  Result Value Ref Range Status   MRSA by PCR NEGATIVE NEGATIVE Final    Comment:        The GeneXpert MRSA Assay (FDA approved for NASAL specimens only), is one component of a comprehensive MRSA colonization surveillance program. It is not intended to diagnose MRSA infection nor to guide or monitor treatment for MRSA infections.      Studies:  Recent x-ray studies have been reviewed in detail by the Attending Physician  Scheduled Meds:  Scheduled Meds: . anastrozole  1 mg Oral Daily  . antiseptic oral rinse  7 mL Mouth Rinse BID  . cloNIDine  0.2 mg Oral BID  . dexamethasone  4 mg Intravenous 4 times per day  . DULoxetine  60 mg Oral BID  . febuxostat  40 mg Oral Daily  . felodipine  2.5 mg Oral Daily  . gabapentin  100 mg Oral Daily  . insulin aspart  0-20 Units Subcutaneous TID WC  . insulin aspart  10 Units Subcutaneous TID WC  . insulin detemir  22 Units Subcutaneous BID  . lacosamide (VIMPAT) IV  200 mg Intravenous Q12H  . levETIRAcetam  1,500 mg Intravenous Q12H  . levothyroxine  200 mcg Oral Once per day on Mon Tue Wed Thu Fri Sat   And  . [START ON 05/10/2014] levothyroxine  300 mcg Oral Once per day on Sun  . lisinopril  40 mg Oral Daily  . LORazepam  0.5 mg Oral QHS  . metoprolol succinate  100 mg Oral Daily  . mometasone-formoterol  2 puff Inhalation BID  . multivitamin with minerals  1 tablet Oral Daily  . pantoprazole  40 mg Oral BID  . simvastatin  40 mg Oral QHS  . vitamin B-12  1,000 mcg Oral Daily    Time spent on care of this patient: 40 mins   Lacy Duverney , [facility]  [facility] Office  [phone] Pager - [phone]  On-Call/Text Page:      [email or web address]      password [email or web address]  If 7PM-7AM, please contact night-coverage [email or web address] Password [email or web address] 05/07/2014, 5:22 PM   LOS: 4 days     Examined Patient and discussed A&P with PA Sahar and agree with above plan.  I  have reviewed the entire database. I have made any necessary editorial changes, and agree with its content. I have reviewed this patient's available data, including medical history, events of note, physical examination, radiology studies and test results as part of my evaluation Pt with Multiple Complex medical problems> 35 min spent in direct Pt care   Dia Crawford, MD [facility] [phone] pager

## 2014-05-07 NOTE — Care Management Note (Signed)
    Page 1 of 2   05/29/2014     11:55:58 AM CARE MANAGEMENT NOTE 05/29/2014  Patient:  Morgan [name], Morgan [name]   Account Number:  [id number]  Date Initiated:  05/07/2014  Documentation initiated by:  MAYO,HENRIETTA  Subjective/Objective Assessment:   dx brain mass/meningioma; lives with spouse    PCP  Gilford Rile     Action/Plan:   Anticipated DC Date:  05/15/2014   Anticipated DC Plan:  [place]  CM consult      Choice offered to / List presented to:             Status of service:  In process, will continue to follow Medicare Important Message given?  YES (If response is "NO", the following Medicare IM given date fields will be blank) Date Medicare IM given:  05/06/2014 Medicare IM given by:  MAYO,HENRIETTA Date Additional Medicare IM given:  05/29/2014 Additional Medicare IM given by:  Lorne Skeens  Discharge Disposition:    Per UR Regulation:  Reviewed for med. necessity/level of care/duration of stay  If discussed at [place] of Stay Meetings, dates discussed:   05/12/2014  05/14/2014    Comments:  05/25/2014- IM Sharlyne Pacas RN,BSN,MHA [id number]  05/20/14 Lorne Skeens RN, MSN, CM- Vimpat Benefits check:  Patient's responsibility is [phone] for a 30 day supply.  (Medication cost is $839.05, covered at 44%= $369.18)   VIMPAT 200 MG BID  COVER - YES  CO-PAY- 44 % FULL LEVEL TIER-4 DRUG PRIOR APPROVAL - YES  # [phone] PHARMACY - ANY RETAIL     05/19/14 [place] RN, MSN, CM- Submitted benefits check on 05/18/14 for Vimpat. Recieved a call from [facility] stating that there was not enough information in the system to verify prescription benefits.  CM spoke with family today, who will be bringing the prescription card to have a photocopy made.  Bedside RN notified.   05/18/14 [place], MSN, CM- Additional Medicare IM letter provided.   05/11/2014 Sandi Mariscal, RN BSN MHA CCM 0900--Pt on vent  following crani to resect tumor on 1/29.  05/06/14 [place] Per dtr, pt's husband is recovering from [place] and is receiving home therapy from [facility], pt was his primary caregiver PTA.  Dtr and son are now taking turns staying with pt and with their dad as well.  Craniotomy scheduled for Friday.

## 2014-05-07 NOTE — Progress Notes (Signed)
Subjective: Had significant delirium overnight.   Exam: Filed Vitals:   05/07/14 0700  BP:   Pulse: 50  Temp:   Resp: 22   Gen: In bed, NAD MS: awake, alert, answers questions and follows commnads.  EU:MPNTI, has vision in the left hemifield  Motor: 5-/5 in the left arm, 5-/5 in the left leg.  Sensory: intact to LT   Impression: 74 yo F with new onset seizure in the setting of a right frontal meningioma. Her left sided symptoms had started prior to seizure, but she had attributed them "to my bad knee." With continued repeated seizures, will continue to increase her AEDs. Hopefully as edema improves and AEDs are titrated, these will come under control. I suspect delirium multifactoral including frequent sz, steroids, hospitalization, brain mass.   Recommendations: 1) Continue keppra 1027m BID 2) continue decadron 429mq6h.  3) continue vimpat 20068mID 4) Appreciate neurosurgical assistance.  5) ativan PRN seizure flurry  McNRoland RackD [facility] [phone] 7pm- 7am, please page neurology on call as listed in AMIC[place]le

## 2014-05-08 ENCOUNTER — Inpatient Hospital Stay (HOSPITAL_COMMUNITY): Payer: Medicare Other

## 2014-05-08 ENCOUNTER — Encounter (HOSPITAL_COMMUNITY)
Admission: EM | Disposition: A | Discharge status: Rehabilitation Facility | Payer: Self-pay | Source: Home / Self Care | Attending: Neurosurgery

## 2014-05-08 ENCOUNTER — Inpatient Hospital Stay (HOSPITAL_COMMUNITY): Payer: Medicare Other | Admitting: Certified Registered Nurse Anesthetist

## 2014-05-08 ENCOUNTER — Encounter (HOSPITAL_COMMUNITY): Payer: Self-pay | Admitting: Certified Registered Nurse Anesthetist

## 2014-05-08 DIAGNOSIS — I10 Essential (primary) hypertension: Secondary | ICD-10-CM | POA: Insufficient documentation

## 2014-05-08 DIAGNOSIS — G9340 Encephalopathy, unspecified: Secondary | ICD-10-CM

## 2014-05-08 DIAGNOSIS — D329 Benign neoplasm of meninges, unspecified: Secondary | ICD-10-CM

## 2014-05-08 DIAGNOSIS — G629 Polyneuropathy, unspecified: Secondary | ICD-10-CM | POA: Insufficient documentation

## 2014-05-08 DIAGNOSIS — E871 Hypo-osmolality and hyponatremia: Secondary | ICD-10-CM | POA: Insufficient documentation

## 2014-05-08 DIAGNOSIS — K219 Gastro-esophageal reflux disease without esophagitis: Secondary | ICD-10-CM | POA: Insufficient documentation

## 2014-05-08 HISTORY — PX: CRANIOTOMY: SHX93

## 2014-05-08 LAB — GLUCOSE, CAPILLARY
Glucose-Capillary: 222 mg/dL — ABNORMAL HIGH (ref 70–99)
Glucose-Capillary: 200 mg/dL — ABNORMAL HIGH (ref 70–99)
Glucose-Capillary: 193 mg/dL — ABNORMAL HIGH (ref 70–99)

## 2014-05-08 LAB — CBC
HCT: 35.6 % — ABNORMAL LOW (ref 36.0–46.0)
Hemoglobin: 12.4 g/dL (ref 12.0–15.0)
MCH: 30.5 pg (ref 26.0–34.0)
MCHC: 34.8 g/dL (ref 30.0–36.0)
MCV: 87.5 fL (ref 78.0–100.0)
Platelets: 172 10*3/uL (ref 150–400)
RBC: 4.07 MIL/uL (ref 3.87–5.11)
RDW: 12.4 % (ref 11.5–15.5)
WBC: 8.2 10*3/uL (ref 4.0–10.5)

## 2014-05-08 LAB — BASIC METABOLIC PANEL
Anion gap: 4 — ABNORMAL LOW (ref 5–15)
BUN: 28 mg/dL — AB (ref 6–23)
CHLORIDE: 104 mmol/L (ref 96–112)
CO2: 27 mmol/L (ref 19–32)
Calcium: 8.5 mg/dL (ref 8.4–10.5)
Creatinine, Ser: 1.02 mg/dL (ref 0.50–1.10)
GFR calc non Af Amer: 53 mL/min — ABNORMAL LOW (ref >90)
GFR, EST AFRICAN AMERICAN: 62 mL/min — AB (ref >90)
Glucose, Bld: 200 mg/dL — ABNORMAL HIGH (ref 70–99)
Potassium: 4.2 mmol/L (ref 3.5–5.1)
SODIUM: 135 mmol/L (ref 135–145)

## 2014-05-08 LAB — BLOOD GAS, ARTERIAL
Acid-base deficit: 4 mmol/L — ABNORMAL HIGH (ref 0.0–2.0)
BICARBONATE: 20.6 meq/L (ref 20.0–24.0)
Drawn by: 41977
FIO2: 0.4 %
O2 Saturation: 97.4 %
PEEP/CPAP: 5 cmH2O
PO2 ART: 107 mmHg — AB (ref 80.0–100.0)
Patient temperature: 98.6
RATE: 16 resp/min
TCO2: 21.8 mmol/L (ref 0–100)
VT: 440 mL
pCO2 arterial: 38.2 mmHg (ref 35.0–45.0)
pH, Arterial: 7.351 (ref 7.350–7.450)

## 2014-05-08 SURGERY — CRANIOTOMY TUMOR EXCISION
Anesthesia: General | Site: Head | Laterality: Right

## 2014-05-08 MED ORDER — POTASSIUM CHLORIDE IN NACL 20-0.9 MEQ/L-% IV SOLN
INTRAVENOUS | Status: DC
Start: 2014-05-08 — End: 2014-05-08
  Administered 2014-05-08: 18:00:00 via INTRAVENOUS
  Filled 2014-05-08 (×2): qty 1000

## 2014-05-08 MED ORDER — SODIUM CHLORIDE 0.9 % IR SOLN
Status: DC | PRN
  Administered 2014-05-08 (×3): 1000 mL

## 2014-05-08 MED ORDER — NALOXONE HCL 0.4 MG/ML IJ SOLN: 0.0800 mg | INTRAMUSCULAR | Status: DC | PRN

## 2014-05-08 MED ORDER — ALBUMIN HUMAN 5 % IV SOLN
INTRAVENOUS | Status: DC | PRN
  Administered 2014-05-08: 15:00:00 via INTRAVENOUS

## 2014-05-08 MED ORDER — POLYETHYLENE GLYCOL 3350 17 G PO PACK
17.0000 g | PACK | Freq: Every day | ORAL | Status: DC | PRN
  Administered 2014-05-10: 17 g via ORAL
  Filled 2014-05-08 (×2): qty 1

## 2014-05-08 MED ORDER — SODIUM CHLORIDE 0.9 % IV SOLN
INTRAVENOUS | Status: DC
  Filled 2014-05-08: qty 2.5

## 2014-05-08 MED ORDER — BACITRACIN ZINC 500 UNIT/GM EX OINT
TOPICAL_OINTMENT | CUTANEOUS | Status: DC | PRN
  Administered 2014-05-08: 1 via TOPICAL

## 2014-05-08 MED ORDER — MORPHINE SULFATE 2 MG/ML IJ SOLN: 1.0000 mg | INTRAMUSCULAR | Status: DC | PRN

## 2014-05-08 MED ORDER — MAGNESIUM CITRATE PO SOLN
1.0000 | Freq: Once | ORAL | Status: AC | PRN
  Filled 2014-05-08: qty 296

## 2014-05-08 MED ORDER — GLYCOPYRROLATE 0.2 MG/ML IJ SOLN
INTRAMUSCULAR | Status: DC | PRN
  Administered 2014-05-08: 0.2 mg via INTRAVENOUS

## 2014-05-08 MED ORDER — SODIUM CHLORIDE 0.9 % IV SOLN
INTRAVENOUS | Status: DC | PRN
  Administered 2014-05-08 (×2): via INTRAVENOUS

## 2014-05-08 MED ORDER — CETYLPYRIDINIUM CHLORIDE 0.05 % MT LIQD
7.0000 mL | Freq: Four times a day (QID) | OROMUCOSAL | Status: DC
  Administered 2014-05-09 – 2014-05-12 (×15): 7 mL via OROMUCOSAL

## 2014-05-08 MED ORDER — ARTIFICIAL TEARS OP OINT
TOPICAL_OINTMENT | OPHTHALMIC | Status: AC
  Filled 2014-05-08: qty 3.5

## 2014-05-08 MED ORDER — ROCURONIUM BROMIDE 100 MG/10ML IV SOLN
INTRAVENOUS | Status: DC | PRN
  Administered 2014-05-08: 50 mg via INTRAVENOUS

## 2014-05-08 MED ORDER — MIDAZOLAM HCL 2 MG/2ML IJ SOLN
1.0000 mg | INTRAMUSCULAR | Status: DC | PRN
  Administered 2014-05-09 (×2): 1 mg via INTRAVENOUS
  Filled 2014-05-08 (×2): qty 2

## 2014-05-08 MED ORDER — CHLORHEXIDINE GLUCONATE 0.12 % MT SOLN: 15.0000 mL | Freq: Two times a day (BID) | OROMUCOSAL | Status: DC

## 2014-05-08 MED ORDER — THROMBIN 5000 UNITS EX SOLR
OROMUCOSAL | Status: DC | PRN
  Administered 2014-05-08 (×2): 10 mL via TOPICAL

## 2014-05-08 MED ORDER — ONDANSETRON HCL 4 MG/2ML IJ SOLN
INTRAMUSCULAR | Status: DC | PRN
  Administered 2014-05-08: 4 mg via INTRAVENOUS

## 2014-05-08 MED ORDER — FENTANYL CITRATE 0.05 MG/ML IJ SOLN
50.0000 ug | INTRAMUSCULAR | Status: DC | PRN
  Administered 2014-05-08 (×2): 50 ug via INTRAVENOUS
  Filled 2014-05-08 (×2): qty 2

## 2014-05-08 MED ORDER — FENTANYL CITRATE 0.05 MG/ML IJ SOLN
INTRAMUSCULAR | Status: AC
  Filled 2014-05-08: qty 5

## 2014-05-08 MED ORDER — CLONIDINE HCL 0.1 MG PO TABS
0.1000 mg | ORAL_TABLET | Freq: Two times a day (BID) | ORAL | Status: DC
  Administered 2014-05-08: 0.1 mg via ORAL
  Filled 2014-05-08 (×3): qty 1

## 2014-05-08 MED ORDER — BISACODYL 10 MG RE SUPP
10.0000 mg | Freq: Every day | RECTAL | Status: DC | PRN
  Administered 2014-05-11: 10 mg via RECTAL
  Filled 2014-05-08: qty 1

## 2014-05-08 MED ORDER — PROPOFOL 10 MG/ML IV BOLUS
INTRAVENOUS | Status: DC | PRN
  Administered 2014-05-08: 80 mg via INTRAVENOUS
  Administered 2014-05-08: 100 mg via INTRAVENOUS

## 2014-05-08 MED ORDER — PANTOPRAZOLE SODIUM 40 MG IV SOLR
40.0000 mg | Freq: Every day | INTRAVENOUS | Status: DC
  Administered 2014-05-08 – 2014-05-10 (×3): 40 mg via INTRAVENOUS
  Filled 2014-05-08 (×5): qty 40

## 2014-05-08 MED ORDER — ONDANSETRON HCL 4 MG/2ML IJ SOLN
INTRAMUSCULAR | Status: AC
  Filled 2014-05-08: qty 2

## 2014-05-08 MED ORDER — LIDOCAINE HCL (CARDIAC) 20 MG/ML IV SOLN
INTRAVENOUS | Status: DC | PRN
  Administered 2014-05-08: 100 mg via INTRAVENOUS

## 2014-05-08 MED ORDER — LABETALOL HCL 5 MG/ML IV SOLN
10.0000 mg | INTRAVENOUS | Status: DC | PRN
  Administered 2014-05-10 (×2): 30 mg via INTRAVENOUS
  Administered 2014-05-10: 40 mg via INTRAVENOUS
  Administered 2014-05-10 (×2): 10 mg via INTRAVENOUS
  Administered 2014-05-10: 15 mg via INTRAVENOUS
  Administered 2014-05-11: 40 mg via INTRAVENOUS
  Filled 2014-05-08: qty 8
  Filled 2014-05-08: qty 4
  Filled 2014-05-08: qty 8
  Filled 2014-05-08: qty 4
  Filled 2014-05-08 (×2): qty 8
  Filled 2014-05-08: qty 4
  Filled 2014-05-08: qty 8

## 2014-05-08 MED ORDER — CETYLPYRIDINIUM CHLORIDE 0.05 % MT LIQD: 7.0000 mL | Freq: Four times a day (QID) | OROMUCOSAL | Status: DC

## 2014-05-08 MED ORDER — EPHEDRINE SULFATE 50 MG/ML IJ SOLN
INTRAMUSCULAR | Status: DC | PRN
  Administered 2014-05-08: 5 mg via INTRAVENOUS

## 2014-05-08 MED ORDER — FENTANYL CITRATE 0.05 MG/ML IJ SOLN
INTRAMUSCULAR | Status: DC | PRN
  Administered 2014-05-08 (×5): 50 ug via INTRAVENOUS

## 2014-05-08 MED ORDER — MIDAZOLAM HCL 2 MG/2ML IJ SOLN
1.0000 mg | INTRAMUSCULAR | Status: DC | PRN
  Administered 2014-05-08: 1 mg via INTRAVENOUS
  Filled 2014-05-08: qty 2

## 2014-05-08 MED ORDER — CHLORHEXIDINE GLUCONATE 0.12 % MT SOLN
15.0000 mL | Freq: Two times a day (BID) | OROMUCOSAL | Status: DC
  Administered 2014-05-08 – 2014-05-12 (×8): 15 mL via OROMUCOSAL
  Filled 2014-05-08 (×8): qty 15

## 2014-05-08 MED ORDER — SUCCINYLCHOLINE CHLORIDE 20 MG/ML IJ SOLN
INTRAMUSCULAR | Status: DC | PRN
  Administered 2014-05-08: 140 mg via INTRAVENOUS

## 2014-05-08 MED ORDER — INSULIN ASPART 100 UNIT/ML ~~LOC~~ SOLN
0.0000 [IU] | SUBCUTANEOUS | Status: DC
  Administered 2014-05-08 – 2014-05-09 (×3): 5 [IU] via SUBCUTANEOUS
  Administered 2014-05-09: 8 [IU] via SUBCUTANEOUS
  Administered 2014-05-09: 3 [IU] via SUBCUTANEOUS
  Administered 2014-05-09 (×2): 5 [IU] via SUBCUTANEOUS
  Administered 2014-05-10: 15 [IU] via SUBCUTANEOUS
  Administered 2014-05-10: 8 [IU] via SUBCUTANEOUS
  Administered 2014-05-10: 5 [IU] via SUBCUTANEOUS
  Administered 2014-05-10 (×2): 8 [IU] via SUBCUTANEOUS
  Administered 2014-05-10: 15 [IU] via SUBCUTANEOUS
  Administered 2014-05-10: 11 [IU] via SUBCUTANEOUS
  Administered 2014-05-11 (×2): 15 [IU] via SUBCUTANEOUS

## 2014-05-08 MED ORDER — HYDROCODONE-ACETAMINOPHEN 5-325 MG PO TABS: 1.0000 | ORAL_TABLET | ORAL | Status: DC | PRN

## 2014-05-08 MED ORDER — PHENYLEPHRINE HCL 10 MG/ML IJ SOLN
INTRAMUSCULAR | Status: AC
  Filled 2014-05-08: qty 1

## 2014-05-08 MED ORDER — FENTANYL CITRATE 0.05 MG/ML IJ SOLN
50.0000 ug | INTRAMUSCULAR | Status: DC | PRN
  Administered 2014-05-08 – 2014-05-11 (×10): 50 ug via INTRAVENOUS
  Filled 2014-05-08 (×11): qty 2

## 2014-05-08 MED ORDER — MICROFIBRILLAR COLL HEMOSTAT EX PADS
MEDICATED_PAD | CUTANEOUS | Status: DC | PRN
  Administered 2014-05-08: 1 via TOPICAL

## 2014-05-08 MED ORDER — THROMBIN 20000 UNITS EX SOLR
CUTANEOUS | Status: DC | PRN
  Administered 2014-05-08: 20 mL via TOPICAL

## 2014-05-08 MED ORDER — PROMETHAZINE HCL 25 MG PO TABS: 12.5000 mg | ORAL_TABLET | ORAL | Status: DC | PRN

## 2014-05-08 MED ORDER — PHENYLEPHRINE HCL 10 MG/ML IJ SOLN
10.0000 mg | INTRAMUSCULAR | Status: DC | PRN
  Administered 2014-05-08: 20 ug/min via INTRAVENOUS

## 2014-05-08 MED ORDER — ROCURONIUM BROMIDE 50 MG/5ML IV SOLN
INTRAVENOUS | Status: AC
  Filled 2014-05-08: qty 1

## 2014-05-08 MED ORDER — SENNA 8.6 MG PO TABS
1.0000 | ORAL_TABLET | Freq: Two times a day (BID) | ORAL | Status: DC
  Administered 2014-05-08 – 2014-05-10 (×5): 8.6 mg via ORAL
  Filled 2014-05-08 (×7): qty 1

## 2014-05-08 MED ORDER — LIDOCAINE-EPINEPHRINE 0.5 %-1:200000 IJ SOLN
INTRAMUSCULAR | Status: DC | PRN
  Administered 2014-05-08: 10 mL

## 2014-05-08 MED ORDER — INSULIN ASPART 100 UNIT/ML ~~LOC~~ SOLN
2.0000 [IU] | SUBCUTANEOUS | Status: DC
Start: 2014-05-08 — End: 2014-05-08
  Administered 2014-05-08: 4 [IU] via SUBCUTANEOUS

## 2014-05-08 MED ORDER — PROPOFOL 10 MG/ML IV BOLUS
INTRAVENOUS | Status: AC
  Filled 2014-05-08: qty 20

## 2014-05-08 SURGICAL SUPPLY — 116 items
APL SKNCLS STERI-STRIP NONHPOA (GAUZE/BANDAGES/DRESSINGS)
BENZOIN TINCTURE PRP APPL 2/3 (GAUZE/BANDAGES/DRESSINGS) IMPLANT
BLADE CLIPPER SURG (BLADE) ×3 IMPLANT
BLADE SAW GIGLI 16 STRL (MISCELLANEOUS) IMPLANT
BLADE SURG 15 STRL LF DISP TIS (BLADE) IMPLANT
BLADE SURG 15 STRL SS (BLADE)
BLADE ULTRA TIP 2M (BLADE) ×3 IMPLANT
BNDG GAUZE ELAST 4 BULKY (GAUZE/BANDAGES/DRESSINGS) IMPLANT
BRUSH SCRUB EZ 1% IODOPHOR (MISCELLANEOUS) ×3 IMPLANT
BUR ACORN 6.0 PRECISION (BURR) ×2 IMPLANT
BUR ACORN 6.0MM PRECISION (BURR) ×1
BUR ADDG 1.1 (BURR) IMPLANT
BUR ADDG 1.1MM (BURR)
BUR MATCHSTICK NEURO 3.0 LAGG (BURR) IMPLANT
BUR ROUTER D-58 CRANI (BURR) IMPLANT
BUR TAPERED ROUTER 3.0 (BURR) ×2 IMPLANT
CANISTER SUCT 3000ML (MISCELLANEOUS) ×3 IMPLANT
CATH VENTRIC 35X38 W/TROCAR LG (CATHETERS) IMPLANT
CLIP TI MEDIUM 6 (CLIP) IMPLANT
CONT SPEC 4OZ CLIKSEAL STRL BL (MISCELLANEOUS) ×6 IMPLANT
COVER MAYO STAND STRL (DRAPES) IMPLANT
DECANTER SPIKE VIAL GLASS SM (MISCELLANEOUS) ×3 IMPLANT
DRAIN SNY WOU 7FLT (WOUND CARE) IMPLANT
DRAIN SUBARACHNOID (WOUND CARE) IMPLANT
DRAPE CAMERA VIDEO/LASER (DRAPES) IMPLANT
DRAPE LONG LASER MIC (DRAPES) IMPLANT
DRAPE MICROSCOPE LEICA (MISCELLANEOUS) IMPLANT
DRAPE NEUROLOGICAL W/INCISE (DRAPES) IMPLANT
DRAPE ORTHO SPLIT 77X108 STRL (DRAPES)
DRAPE STERI IOBAN 125X83 (DRAPES) IMPLANT
DRAPE SURG 17X23 STRL (DRAPES) IMPLANT
DRAPE SURG IRRIG POUCH 19X23 (DRAPES) IMPLANT
DRAPE SURG ORHT 6 SPLT 77X108 (DRAPES) IMPLANT
DRAPE WARM FLUID 44X44 (DRAPE) ×3 IMPLANT
DRSG TELFA 3X8 NADH (GAUZE/BANDAGES/DRESSINGS) ×3 IMPLANT
DURAPREP 6ML APPLICATOR 50/CS (WOUND CARE) ×3 IMPLANT
ELECT CAUTERY BLADE 6.4 (BLADE) ×3 IMPLANT
ELECT REM PT RETURN 9FT ADLT (ELECTROSURGICAL) ×3
ELECTRODE REM PT RTRN 9FT ADLT (ELECTROSURGICAL) ×1 IMPLANT
EVACUATOR 1/8 PVC DRAIN (DRAIN) IMPLANT
EVACUATOR SILICONE 100CC (DRAIN) IMPLANT
FORCEPS BIPOLAR SPETZLER 8 1.0 (NEUROSURGERY SUPPLIES) ×2 IMPLANT
GAUZE SPONGE 4X4 12PLY STRL (GAUZE/BANDAGES/DRESSINGS) ×3 IMPLANT
GAUZE SPONGE 4X4 16PLY XRAY LF (GAUZE/BANDAGES/DRESSINGS) IMPLANT
GLOVE BIO SURGEON STRL SZ 6.5 (GLOVE) IMPLANT
GLOVE BIO SURGEON STRL SZ7 (GLOVE) IMPLANT
GLOVE BIO SURGEON STRL SZ7.5 (GLOVE) IMPLANT
GLOVE BIO SURGEON STRL SZ8 (GLOVE) IMPLANT
GLOVE BIO SURGEON STRL SZ8.5 (GLOVE) IMPLANT
GLOVE BIO SURGEONS STRL SZ 6.5 (GLOVE)
GLOVE BIOGEL M 8.0 STRL (GLOVE) IMPLANT
GLOVE ECLIPSE 6.5 STRL STRAW (GLOVE) ×3 IMPLANT
GLOVE ECLIPSE 7.0 STRL STRAW (GLOVE) IMPLANT
GLOVE ECLIPSE 7.5 STRL STRAW (GLOVE) IMPLANT
GLOVE ECLIPSE 8.0 STRL XLNG CF (GLOVE) IMPLANT
GLOVE ECLIPSE 8.5 STRL (GLOVE) IMPLANT
GLOVE EXAM NITRILE LRG STRL (GLOVE) IMPLANT
GLOVE EXAM NITRILE MD LF STRL (GLOVE) IMPLANT
GLOVE EXAM NITRILE XL STR (GLOVE) IMPLANT
GLOVE EXAM NITRILE XS STR PU (GLOVE) IMPLANT
GLOVE INDICATOR 6.5 STRL GRN (GLOVE) IMPLANT
GLOVE INDICATOR 7.0 STRL GRN (GLOVE) IMPLANT
GLOVE INDICATOR 7.5 STRL GRN (GLOVE) IMPLANT
GLOVE INDICATOR 8.0 STRL GRN (GLOVE) IMPLANT
GLOVE INDICATOR 8.5 STRL (GLOVE) IMPLANT
GLOVE OPTIFIT SS 8.0 STRL (GLOVE) IMPLANT
GLOVE SURG SS PI 6.5 STRL IVOR (GLOVE) IMPLANT
GOWN STRL REUS W/ TWL LRG LVL3 (GOWN DISPOSABLE) ×2 IMPLANT
GOWN STRL REUS W/ TWL XL LVL3 (GOWN DISPOSABLE) IMPLANT
GOWN STRL REUS W/TWL 2XL LVL3 (GOWN DISPOSABLE) IMPLANT
GOWN STRL REUS W/TWL LRG LVL3 (GOWN DISPOSABLE) ×6
GOWN STRL REUS W/TWL XL LVL3 (GOWN DISPOSABLE)
GRAFT SUTURABLE BP 6CMX8CM (Tissue) ×4 IMPLANT
HEMOSTAT SURGICEL 2X14 (HEMOSTASIS) IMPLANT
KIT BASIN OR (CUSTOM PROCEDURE TRAY) ×3 IMPLANT
KIT DRAIN CSF ACCUDRAIN (MISCELLANEOUS) IMPLANT
KIT ROOM TURNOVER OR (KITS) ×3 IMPLANT
NDL HYPO 25X1 1.5 SAFETY (NEEDLE) ×1 IMPLANT
NDL SPNL 18GX3.5 QUINCKE PK (NEEDLE) IMPLANT
NEEDLE HYPO 25X1 1.5 SAFETY (NEEDLE) ×3 IMPLANT
NEEDLE SPNL 18GX3.5 QUINCKE PK (NEEDLE) IMPLANT
NS IRRIG 1000ML POUR BTL (IV SOLUTION) ×3 IMPLANT
PACK CRANIOTOMY (CUSTOM PROCEDURE TRAY) ×3 IMPLANT
PAD DRESSING TELFA 3X8 NADH (GAUZE/BANDAGES/DRESSINGS) ×1 IMPLANT
PAD EYE OVAL STERILE LF (GAUZE/BANDAGES/DRESSINGS) IMPLANT
PATTIES SURGICAL .25X.25 (GAUZE/BANDAGES/DRESSINGS) IMPLANT
PATTIES SURGICAL .5 X.5 (GAUZE/BANDAGES/DRESSINGS) IMPLANT
PATTIES SURGICAL .5 X3 (DISPOSABLE) IMPLANT
PATTIES SURGICAL 1/4 X 3 (GAUZE/BANDAGES/DRESSINGS) IMPLANT
PATTIES SURGICAL 1X1 (DISPOSABLE) IMPLANT
PLATE 1.5  2HOLE LNG NEURO (Plate) ×4 IMPLANT
PLATE 1.5 2HOLE LNG NEURO (Plate) IMPLANT
PLATE 1.5 5HOLE SQUARE (Plate) ×6 IMPLANT
RUBBERBAND STERILE (MISCELLANEOUS) IMPLANT
SCREW SELF DRILL HT 1.5/4MM (Screw) ×32 IMPLANT
SPECIMEN JAR SMALL (MISCELLANEOUS) IMPLANT
SPONGE NEURO XRAY DETECT 1X3 (DISPOSABLE) IMPLANT
SPONGE SURGIFOAM ABS GEL 100 (HEMOSTASIS) ×3 IMPLANT
STAPLER VISISTAT 35W (STAPLE) ×3 IMPLANT
SUT ETHILON 3 0 FSL (SUTURE) IMPLANT
SUT ETHILON 3 0 PS 1 (SUTURE) IMPLANT
SUT NURALON 4 0 TR CR/8 (SUTURE) ×9 IMPLANT
SUT PL GUT 3 0 FS 1 (SUTURE) IMPLANT
SUT SILK 0 TIES 10X30 (SUTURE) IMPLANT
SUT STEEL 0 (SUTURE)
SUT STEEL 0 18XMFL TIE 17 (SUTURE) IMPLANT
SUT VIC AB 2-0 CT2 18 VCP726D (SUTURE) ×6 IMPLANT
SYR CONTROL 10ML LL (SYRINGE) ×3 IMPLANT
TIP SONASTAR STD MISONIX 1.9 (TRAY / TRAY PROCEDURE) IMPLANT
TOWEL OR 17X24 6PK STRL BLUE (TOWEL DISPOSABLE) ×3 IMPLANT
TOWEL OR 17X26 10 PK STRL BLUE (TOWEL DISPOSABLE) ×3 IMPLANT
TRAY FOLEY CATH 14FRSI W/METER (CATHETERS) ×3 IMPLANT
TUBE CONNECTING 12'X1/4 (SUCTIONS) ×1
TUBE CONNECTING 12X1/4 (SUCTIONS) ×2 IMPLANT
UNDERPAD 30X30 INCONTINENT (UNDERPADS AND DIAPERS) ×3 IMPLANT
WATER STERILE IRR 1000ML POUR (IV SOLUTION) ×3 IMPLANT

## 2014-05-08 NOTE — Transfer of Care (Signed)
Immediate Anesthesia Transfer of Care Note  Patient: Morgan [name]  Procedure(s) Performed: Procedure(s) with comments: [place] (Right) - Right Craniotomy for meningioma  Patient Location: NICU  Anesthesia Type:General  Level of Consciousness: Patient remains intubated per anesthesia plan  Airway & Oxygen Therapy: Patient remains intubated per anesthesia plan and Patient placed on Ventilator (see vital sign flow sheet for setting)  Post-op Assessment: Report given to RN and Post -op Vital signs reviewed and stable  Post vital signs: Reviewed and stable  Last Vitals:  Filed Vitals:   05/08/14 1732  BP: 119/55  Pulse: 56  Temp:   Resp: 16    Complications: No apparent anesthesia complications

## 2014-05-08 NOTE — Progress Notes (Signed)
Inpatient Diabetes Program Recommendations  AACE/ADA: New Consensus Statement on Inpatient Glycemic Control (2013)  Target Ranges:  Prepandial:   less than 140 mg/dL      Peak postprandial:   less than 180 mg/dL (1-2 hours)      Critically ill patients:  140 - 180 mg/dL    Results for SYMPHONI, QUANDT (MRN [id number]) as of 05/08/2014 09:47  Ref. Range 05/07/2014 08:19 05/07/2014 10:01 05/07/2014 12:57 05/07/2014 17:08 05/07/2014 22:04  Glucose-Capillary Latest Range: 70-99 mg/dL 256 (H) 214 (H) 231 (H) 198 (H) 199 (H)      Diabetes history: Type 2 diabetes Outpatient Diabetes medications: U500 insulin   Current Insulin Orders: Levemir 22 units bid      Novolog 10 units tidwc      Novolog Resistant SSI     MD- Please consider increasing Levemir to 25 units bid    Will follow Wyn Quaker RN, MSN, CDE Diabetes Coordinator Inpatient Diabetes Program Team Pager: [phone] (8a-10p)

## 2014-05-08 NOTE — Anesthesia Procedure Notes (Addendum)
Procedure Name: Intubation Date/Time: 05/08/2014 2:39 PM Performed by: [place] Pink Pre-anesthesia Checklist: Patient identified, Timeout performed, Emergency Drugs available, Suction available and Patient being monitored Patient Re-evaluated:Patient Re-evaluated prior to inductionOxygen Delivery Method: Circle system utilized Preoxygenation: Pre-oxygenation with 100% oxygen Intubation Type: IV induction Ventilation: Mask ventilation without difficulty Grade View: Grade I Tube type: Subglottic suction tube Tube size: 7.5 mm Number of attempts: 1 Airway Equipment and Method: Stylet and Video-laryngoscopy Placement Confirmation: ETT inserted through vocal cords under direct vision,  positive ETCO2 and breath sounds checked- equal and bilateral Secured at: 22 cm Tube secured with: Tape Dental Injury: Teeth and Oropharynx as per pre-operative assessment  Comments: IV  Induction Rosen- Glidescope AM CRNA- ETT advanced by Rose- atraumatic teeth and mouth as preop

## 2014-05-08 NOTE — Anesthesia Preprocedure Evaluation (Signed)
Anesthesia Evaluation  Patient identified by MRN, date of birth, ID band Patient awake  General Assessment Comment:1.   Acute encephalopathy - CT scan reveals brain mass / meningioma that has enlarged since 2012 and now has vasogenic edema about it as well as midline shift all of which is new since 2012. MRI brain is pending to rule out stroke. There is now high suspicion given the fluxuating neurologic exam that the patient may be having some sort of complex focal seizures associated with the brain mass, neurology has been updated and re-evaluated patient since neuro exam has changed while patient still in ED  Reviewed: Allergy & Precautions, NPO status , Patient's Chart, lab work & pertinent test results  Airway Mallampati: II  TM Distance: >3 FB Neck ROM: Full    Dental no notable dental hx.    Pulmonary sleep apnea and Continuous Positive Airway Pressure Ventilation , COPD breath sounds clear to auscultation  Pulmonary exam normal       Cardiovascular hypertension, Pt. on medications negative cardio ROS  Rhythm:Regular Rate:Normal     Neuro/Psych negative neurological ROS  negative psych ROS   GI/Hepatic negative GI ROS, Neg liver ROS,   Endo/Other  negative endocrine ROSdiabetesHypothyroidism Morbid obesity  Renal/GU negative Renal ROS  negative genitourinary   Musculoskeletal negative musculoskeletal ROS (+)   Abdominal   Peds negative pediatric ROS (+)  Hematology negative hematology ROS (+)   Anesthesia Other Findings   Reproductive/Obstetrics negative OB ROS                             Anesthesia Physical Anesthesia Plan  ASA: IV  Anesthesia Plan: General   Post-op Pain Management:    Induction: Intravenous  Airway Management Planned: Oral ETT  Additional Equipment: Arterial line  Intra-op Plan:   Post-operative Plan: Possible Post-op intubation/ventilation  Informed  Consent: I have reviewed the patients History and Physical, chart, labs and discussed the procedure including the risks, benefits and alternatives for the proposed anesthesia with the patient or authorized representative who has indicated his/her understanding and acceptance.   History available from chart only  Plan Discussed with: CRNA and Surgeon  Anesthesia Plan Comments:         Anesthesia Quick Evaluation

## 2014-05-08 NOTE — Anesthesia Postprocedure Evaluation (Signed)
  Anesthesia Post-op Note  Patient: Morgan [name]  Procedure(s) Performed: Procedure(s) with comments: [place] (Right) - Right Craniotomy for meningioma  Patient Location: NICU  Anesthesia Type:General  Level of Consciousness: unresponsive and Patient remains intubated per anesthesia plan  Airway and Oxygen Therapy: Patient remains intubated per anesthesia plan and Patient placed on Ventilator (see vital sign flow sheet for setting)  Post-op Pain: none  Post-op Assessment: Post-op Vital signs reviewed and Patient's Cardiovascular Status Stable  Post-op Vital Signs: Reviewed and stable  Last Vitals:  Filed Vitals:   05/08/14 1732  BP: 119/55  Pulse: 56  Temp:   Resp: 16    Complications: No apparent anesthesia complications

## 2014-05-08 NOTE — Consult Note (Signed)
PULMONARY / CRITICAL CARE MEDICINE   Name: Morgan [name] MRN: [id number] DOB: [birth year]    ADMISSION DATE:  05/03/2014 CONSULTATION DATE:  05/08/2014  REFERRING MD :  Dr. Christella Noa  CHIEF COMPLAINT:  Meningioma Ressection  INITIAL PRESENTATION: 72 F with seizures in the setting of a meningioma. She underwent resection on 05/08/2014 and remained intubated post-operatively for being a difficult intubation and the late hour. PCCM consulted for vent management.  STUDIES:  Head CT/Brain MRI 1/24 enlargement of known meningioma and mild vasogenic edema Chest X-ray 1/29 --> E TT in adequate position. Lungs clear. ? Mild CM.   SIGNIFICANT EVENTS: Seizure on 1/24 Meningioma resection 1/29  HISTORY OF PRESENT ILLNESS:  Morgan [name] is a 74 yo F with DM, HTN, and a known meningioma who presented to [facility] on 1/24 with seizures in the setting of enlargement of her meningioma. She was taken to the OR on 1/29 and remained intubated post-resection because of difficult intubation and the late hour at the time of completion of the surgery. She is currently intubated and drowsy and unable to provide any history.   PAST MEDICAL HISTORY :   has a past medical history of Diabetes mellitus; Obesity; Diabetes type 2, uncontrolled; Hypoglycemia associated with diabetes; Edema of both legs; Hypertension; Combined hyperlipidemia; Acquired autoimmune hypothyroidism; Thyroiditis, autoimmune; Abnormal liver function tests; Sleep apnea, obstructive; History of gastroesophageal reflux (GERD); Depression; DM neuropathy with neurologic complication; COPD with acute exacerbation; Goiter; Fatigue; Vertigo; Type II diabetes mellitus with peripheral angiopathy; Gout; and Pallor.  has past surgical history that includes Laparoscopic gastric banding and Back surgery. Prior to Admission medications   Medication Sig Start Date End Date Taking? Authorizing Provider  acetaminophen (TYLENOL) 500 MG tablet Take 1,000 mg by mouth every 6  (six) hours as needed (pain).   Yes Historical Provider, MD  albuterol (PROVENTIL,VENTOLIN) 90 MCG/ACT inhaler Inhale 2 puffs into the lungs 4 (four) times daily as needed for wheezing or shortness of breath.    Yes Historical Provider, MD  anastrozole (ARIMIDEX) 1 MG tablet Take 1 mg by mouth daily.     Yes Historical Provider, MD  aspirin 81 MG chewable tablet Chew 81 mg by mouth daily.     Yes Historical Provider, MD  Calcium Carbonate-Vitamin D (CALTRATE 600+D PO) Take 1 tablet by mouth 2 (two) times daily.    Yes Historical Provider, MD  cloNIDine (CATAPRES) 0.3 MG tablet Take 0.3 mg by mouth 2 (two) times daily.     Yes Historical Provider, MD  colchicine (COLCRYS) 0.6 MG tablet Take 0.6 mg by mouth 2 (two) times daily as needed (gout).    Yes Historical Provider, MD  diclofenac sodium (VOLTAREN) 1 % GEL Apply 1 application topically 4 (four) times daily.   Yes Historical Provider, MD  DULoxetine (CYMBALTA) 60 MG capsule Take 60 mg by mouth 2 (two) times daily.    Yes Historical Provider, MD  febuxostat (ULORIC) 40 MG tablet Take 40 mg by mouth daily.    Yes Historical Provider, MD  felodipine (PLENDIL) 2.5 MG 24 hr tablet Take 2.5 mg by mouth daily.     Yes Historical Provider, MD  fexofenadine (ALLEGRA) 180 MG tablet Take 180 mg by mouth daily as needed for allergies or rhinitis.   Yes Historical Provider, MD  Fluticasone-Salmeterol (ADVAIR) 250-50 MCG/DOSE AEPB Inhale 1 puff into the lungs every 12 (twelve) hours.     Yes Historical Provider, MD  furosemide (LASIX) 40 MG tablet Take 40 mg by mouth  2 (two) times daily.     Yes Historical Provider, MD  gabapentin (NEURONTIN) 100 MG capsule Take 100 mg by mouth daily.    Yes Historical Provider, MD  Glucosamine HCl (GLUCOSAMINE PO) Take 1 tablet by mouth daily.   Yes Historical Provider, MD  HYDROcodone-acetaminophen (NORCO/VICODIN) 5-325 MG per tablet Take 1 tablet by mouth every 6 (six) hours as needed for moderate pain.   Yes Historical  Provider, MD  ibuprofen (ADVIL,MOTRIN) 200 MG tablet Take 200 mg by mouth every 6 (six) hours as needed (pain).   Yes Historical Provider, MD  insulin regular human CONCENTRATED (HUMULIN R) 500 UNIT/ML SOLN injection Inject 4-18 Units into the skin 3 (three) times daily with meals. CBG less than 70, treat the low blood sugar and recheck in 15 min. 70-90 4-6 units, 91-130 6-10 units, 131-150 7-11 units, 151-200 8-12 units, 201-250 9-13 units, 251-300 10-14 units, 301-350 11-15 units, 351-400 12-16 units, 401-450 13-17 units >450 14-18 units   Yes Historical Provider, MD  levothyroxine (SYNTHROID, LEVOTHROID) 200 MCG tablet Take 200-300 mcg by mouth. Brand Name Synthroid Only. Take 1 1/2 tablets (300 mg) on Sundays, take 1 tablet (200 mg) Monday thru Saturday 12/14/11  Yes Sherrlyn Hock, MD  Liraglutide 18 MG/3ML SOPN Inject 1.8 mg into the skin daily. Victoza   Yes Historical Provider, MD  lisinopril (PRINIVIL,ZESTRIL) 40 MG tablet Take 40 mg by mouth daily.  04/18/14  Yes Historical Provider, MD  LORazepam (ATIVAN) 0.5 MG tablet Take 0.5 mg by mouth at bedtime.    Yes Historical Provider, MD  metoprolol succinate (TOPROL-XL) 100 MG 24 hr tablet Take 100 mg by mouth daily. Take with or immediately following a meal.   Yes Historical Provider, MD  Multiple Vitamin (MULTIVITAMIN WITH MINERALS) TABS tablet Take 1 tablet by mouth daily. Centrum Chewable   Yes Historical Provider, MD  Multiple Vitamin (MULTIVITAMIN WITH MINERALS) TABS tablet Take 1 tablet by mouth daily. One A Day with Iron   Yes Historical Provider, MD  nitroGLYCERIN (NITROSTAT) 0.4 MG SL tablet Place 0.4 mg under the tongue every 5 (five) minutes as needed for chest pain.    Yes Historical Provider, MD  omeprazole (PRILOSEC OTC) 20 MG tablet Take 20 mg by mouth 2 (two) times daily.   Yes Historical Provider, MD  senna (SENOKOT) 8.6 MG TABS tablet Take 1 tablet by mouth daily as needed for mild constipation.   Yes Historical Provider, MD   simvastatin (ZOCOR) 40 MG tablet Take 40 mg by mouth at bedtime.     Yes Historical Provider, MD  traMADol (ULTRAM) 50 MG tablet Take 50 mg by mouth 3 (three) times daily as needed (pain).   Yes Historical Provider, MD  vitamin B-12 (CYANOCOBALAMIN) 1000 MCG tablet Take 1,000 mcg by mouth daily.   Yes Historical Provider, MD  ACCU-CHEK SMARTVIEW test strip USE TO TEST BLOOD SUGAR 6 TO 8 TIMES A DAY 09/30/12   Sherrlyn Hock, MD   Allergies  Allergen Reactions  . Latex Rash  . Sulfa Antibiotics Other (See Comments)    Unknown allergic reaction  . Penicillins Rash    FAMILY HISTORY:  has no family status information on file.  SOCIAL HISTORY:  reports that she has never smoked. She does not have any smokeless tobacco history on file. She reports that she does not drink alcohol or use illicit drugs.  REVIEW OF SYSTEMS:  Unable to obtain secondary to patient condition.  SUBJECTIVE:   VITAL SIGNS: Temp:  [  97.4 F (36.3 C)-98.1 F (36.7 C)] 97.8 F (36.6 C) (01/29 1732) Pulse Rate:  [29-104] 30 (01/29 1830) Resp:  [14-31] 14 (01/29 1830) BP: (103-185)/(52-93) 165/93 mmHg (01/29 1830) SpO2:  [96 %-100 %] 100 % (01/29 1830) Arterial Line BP: (148-191)/(57-82) 191/82 mmHg (01/29 1830) FiO2 (%):  [21 %-40 %] 40 % (01/29 1800) Weight:  [241 lb 13.5 oz (109.7 kg)] 241 lb 13.5 oz (109.7 kg) (01/29 1731) HEMODYNAMICS:   VENTILATOR SETTINGS: Vent Mode:  [-] PRVC FiO2 (%):  [21 %-40 %] 40 % Set Rate:  [16 bmp] 16 bmp Vt Set:  [440 mL] 440 mL PEEP:  [5 cmH20] 5 cmH20 INTAKE / OUTPUT:  Intake/Output Summary (Last 24 hours) at 05/08/14 1913 Last data filed at 05/08/14 1730  Gross per 24 hour  Intake   3460 ml  Output   2820 ml  Net    640 ml    PHYSICAL EXAMINATION: General:  Obese F Neuro:  Moves L side. Somnolent. Does not follow commands. HEENT:  Sclera anicteric, conjunctiva pink, MMM, ETT present Cardiovascular:  RRR, NS1/S2, (-) MRG Lungs:  CTAB although exam limited by  body habitus Abdomen:  Obese, NT/ND/(+)BS Musculoskeletal:  (-) C/C/E Skin:  Intact  LABS:  CBC  Recent Labs Lab 05/06/14 0256 05/07/14 2000 05/08/14 0416  WBC 10.1 8.2 8.2  HGB 11.1* 12.9 12.4  HCT 33.2* 37.5 35.6*  PLT 175 185 172   Coag's  Recent Labs Lab 05/03/14 1947 05/07/14 2000  APTT 29 23*  INR 1.29 1.11   BMET  Recent Labs Lab 05/06/14 1500 05/07/14 0435 05/08/14 0416  NA 130* 133* 135  K 4.5 4.3 4.2  CL 102 103 104  CO2 19 24 27   BUN 30* 29* 28*  CREATININE 1.20* 1.09 1.02  GLUCOSE 350* 246* 200*   Electrolytes  Recent Labs Lab 05/06/14 1500 05/07/14 0435 05/08/14 0416  CALCIUM 8.3* 8.4 8.5   Sepsis Markers  Recent Labs Lab 05/03/14 2040 05/04/14 0100  LATICACIDVEN 2.12* 2.6*   ABG No results for input(s): PHART, PCO2ART, PO2ART in the last 168 hours. Liver Enzymes  Recent Labs Lab 05/03/14 1947 05/07/14 0435  AST 67* 47*  ALT 54* 47*  ALKPHOS 165* 120*  BILITOT 0.7 0.7  ALBUMIN 3.2* 2.6*   Cardiac Enzymes No results for input(s): TROPONINI, PROBNP in the last 168 hours. Glucose  Recent Labs Lab 05/07/14 1257 05/07/14 1708 05/07/14 2204 05/08/14 0842 05/08/14 1129 05/08/14 1742  GLUCAP 231* 198* 199* 222* 200* 193*    Imaging Ct Head Wo Contrast  05/07/2014   CLINICAL DATA:  Headache for 2 weeks. Tumor in neck. History of obesity, diabetes and autoimmune thyroiditis. Initial encounter.  EXAM: CT HEAD WITHOUT CONTRAST  TECHNIQUE: Contiguous axial images were obtained from the base of the skull through the vertex without intravenous contrast.  COMPARISON:  Head CT 05/03/2014.  MRI brain 05/03/2014.  FINDINGS: The approximately 3.1 x 1.7 cm right frontal lobe meningioma is again noted with associated low-density in the right frontal periventricular white matter and approximately 3 mm of right to left midline shift. There is no evidence of acute intracranial hemorrhage, new mass lesion or acute intracranial hemorrhage.  There is no hydrocephalus or evidence of acute stroke.  The visualized paranasal sinuses, mastoid air cells and middle ears are clear. The calvarium is intact. There is stable hyperostosis frontalis. There is a stable right parietal scalp lesion.  IMPRESSION: 1. No acute intracranial findings. 2. Stable right frontal meningioma with associated  surrounding vasogenic edema and mild mass effect.   Electronically Signed   By: Camie Patience M.D.   On: 05/07/2014 19:32     ASSESSMENT / PLAN:  PULMONARY OETT: 3 cm above carina A: Post-operative intubation: By report difficult extubation. COPD P:   Lung protective ventilation ON VAP Prevention SAT/SBT in AM Hold home Symbicort, Start Albuterol/Ipratropium  CARDIOVASCULAR CVL: LIJ Placed 05/08/2014 A: HTN: Mild HTN now likely 2/2 discomfort + possibly rebound HTN from clonidine DC. HL: Holding Zocor as IP Bradycardia: Poorly documented from earlier today but HR recorded as 29-34 from 5PM-7PM in EPIC. Not currently bradycardic. No obvious hemodynamic consequences by EPIC record review. P:  Pain Control Restart clonidine at 0.1 mcg bid, low threshold to titrate up Restart home BP meds as required (Felodipine, Lasix, Lisinopril, Metoprolol XL) Monitor for recurrent bradycardia  RENAL A:  Hyponatremia: Resolved. Urinary retention: Noted on 1/26. Unclear etiology. Currently with Foley. CKD III: Cr stable P:   Monitor  GASTROINTESTINAL A:  GERD P:   Continue PPI  HEMATOLOGIC A:  No acute issues  INFECTIOUS A:  No acute issues  ENDOCRINE A:  DM: Poorly controlled thus far.   Hypothyroidism P:   SSI Continue levothyroxine  NEUROLOGIC A:  Seizures 2/2 meningioma: Management per NSH/Neuro Peripheral Neuropathy Depression P:   Follow-up neuro/NSG consults Holding OP Meds   FAMILY  - Updates:  - Inter-disciplinary family meet or Palliative Care meeting due by:  2/5    TODAY'S SUMMARY:    CRITICAL CARE: The patient is  critically ill and requires high complexity decision making for assessment and support, frequent evaluation and titration of therapies, application of advanced monitoring technologies and extensive interpretation of multiple databases. Critical Care Time devoted to patient care services described in this note is 40 minutes.    Pulmonary and [place] Pager: [phone]  05/08/2014, 7:13 PM

## 2014-05-08 NOTE — Op Note (Signed)
05/03/2014 - 05/08/2014  5:29 PM  PATIENT:  Morgan [name]  74 y.o. female whom presented with seizures and was found to have a right frontal mass believed to be a meningioma. She is taken to the operating room for tumor resection.   PRE-OPERATIVE DIAGNOSIS:  Meningioma, right frontal  POST-OPERATIVE DIAGNOSIS:  Meningioma, right frontal  PROCEDURE:  Procedure(s):right frontal stereotactic craniotomy for tumor resection.   SURGEON: Surgeon(s): Ashok Pall, MD Consuella Lose, MD  ASSISTANTS:Nundkumar, Nena Polio  ANESTHESIA:   general  EBL:  Total I/O In: 1060 [P.O.:360; I.V.:450; IV Piggyback:250] Out: 6004 [Urine:950; Blood:700]  BLOOD ADMINISTERED:none  CELL SAVER GIVEN:not applicable  COUNT:per nursing  DRAINS: none   SPECIMEN:  Source of Specimen:  right anterior fossa  DICTATION: Morgan [name] was taken to the operating room, intubated, and placed under a general anesthetic without difficulty.Once adequate anesthesia was obtained she was placed in a three pin Mayfied head holder.  [name] was localized to the Brain Lab system and we were able to achieve She  was prepped, having her head shaved  and draped in a sterile manner.  I infiltrated 10cc lidocaine into the planned incision which was for a pterional type craniotomy. We opened the skin and developed the scalp flap rostrally, then we divided the temporalis muscle leaving a cuff of tissue medially on the frontal bone, and laterally on the temporal bone. We reflected the temporalis rostrally exposing the pterion, and the frontotemporalparietal region. I placed on burr hole in the pterion, the other caudally in the parietal bone. I used the craniotome to turn the bone flap. We got vigorous bleeding in the pterional burr hole which was controlled once the middle meningeal artery was isolated then cauterized. We then elevated the bone flap, and the tumor was densely adherent to the bone. Thus we worked around the tumor  while providing traction on the bone flap. The brain was dissected away from the tumor. We used bipolar cautery to cauterize small feeding vessels, then we divided them sharply. The involved dura was then removed in total with the tumor and bone flap which we were able to deliver from the brain. We achieved hemostasis in the brain, and irrigated copiously . I removed the tumor and dura from the inner table, then used a drill to remove the inner table. I also used the Leksell rongeur to remove pieces of the bone which along with the tumor and dura were sent to pathlology.  We replaced the thinned bone flap using plates and screws. This was performed after laying bovine pericardium over the brain in placed of the resected dura. We closed the temporalis muscle with vicryl sutures, the galea with vicryl sutures, and finally the scalp with staples. I applied a sterile dressing. She was taken to the ICU intubated in stable condition.  PLAN OF CARE: Admit to inpatient   PATIENT DISPOSITION:  ICU - intubated and hemodynamically stable.   Delay start of Pharmacological VTE agent (>24hrs) due to surgical blood loss or risk of bleeding:  yes

## 2014-05-08 NOTE — Progress Notes (Signed)
Morgan [name] [id number] DOB: 11-14-1940 DOA: 05/03/2014 PCP: Gilford Rile, MD   Subj: Patient has been in surgery throughout the day. Unable to evaluate patient.   Obj: Objective: VITAL SIGNS: Temp: 98.1 F (36.7 C) (01/29 1135) Temp Source: Oral (01/29 1135) BP: 147/63 mmHg (01/29 1200) Pulse Rate: 55 (01/29 1200) SPO2; FIO2:   Intake/Output Summary (Last 24 hours) at 05/08/14 1730 Last data filed at 05/08/14 1700  Gross per 24 hour  Intake   2860 ml  Output   2820 ml  Net     40 ml     Exam: General: Lungs:  Cardiovascular:  Abdomen: Extremities:   Procedure/Significant Events:    Culture   Antibiotics:    A/P Right Frontal Meningioma -Patient currently still in surgery for removal of meningioma. Will evaluate patient in the a.m.

## 2014-05-08 NOTE — Progress Notes (Signed)
Patient is enrout to OR for craniotomy.  Family at bedside.  Patient is alert during this transport.

## 2014-05-09 LAB — BASIC METABOLIC PANEL
ANION GAP: 3 — AB (ref 5–15)
BUN: 38 mg/dL — ABNORMAL HIGH (ref 6–23)
CALCIUM: 7.9 mg/dL — AB (ref 8.4–10.5)
CHLORIDE: 108 mmol/L (ref 96–112)
CO2: 24 mmol/L (ref 19–32)
CREATININE: 1.14 mg/dL — AB (ref 0.50–1.10)
GFR calc Af Amer: 54 mL/min — ABNORMAL LOW (ref >90)
GFR calc non Af Amer: 47 mL/min — ABNORMAL LOW (ref >90)
GLUCOSE: 273 mg/dL — AB (ref 70–99)
Potassium: 4.2 mmol/L (ref 3.5–5.1)
SODIUM: 135 mmol/L (ref 135–145)

## 2014-05-09 LAB — COMPREHENSIVE METABOLIC PANEL
ALBUMIN: 2.6 g/dL — AB (ref 3.5–5.2)
ALT: 63 U/L — AB (ref 0–35)
AST: 68 U/L — ABNORMAL HIGH (ref 0–37)
Alkaline Phosphatase: 105 U/L (ref 39–117)
Anion gap: 6 (ref 5–15)
BUN: 34 mg/dL — ABNORMAL HIGH (ref 6–23)
CALCIUM: 8.3 mg/dL — AB (ref 8.4–10.5)
CO2: 21 mmol/L (ref 19–32)
CREATININE: 1.19 mg/dL — AB (ref 0.50–1.10)
Chloride: 105 mmol/L (ref 96–112)
GFR calc non Af Amer: 44 mL/min — ABNORMAL LOW (ref >90)
GFR, EST AFRICAN AMERICAN: 51 mL/min — AB (ref >90)
Glucose, Bld: 233 mg/dL — ABNORMAL HIGH (ref 70–99)
Potassium: 4.3 mmol/L (ref 3.5–5.1)
Sodium: 132 mmol/L — ABNORMAL LOW (ref 135–145)
TOTAL PROTEIN: 5.4 g/dL — AB (ref 6.0–8.3)
Total Bilirubin: 0.8 mg/dL (ref 0.3–1.2)

## 2014-05-09 LAB — CBC
HCT: 33.2 % — ABNORMAL LOW (ref 36.0–46.0)
HEMOGLOBIN: 11.6 g/dL — AB (ref 12.0–15.0)
MCH: 30.4 pg (ref 26.0–34.0)
MCHC: 34.9 g/dL (ref 30.0–36.0)
MCV: 87.1 fL (ref 78.0–100.0)
PLATELETS: 224 10*3/uL (ref 150–400)
RBC: 3.81 MIL/uL — ABNORMAL LOW (ref 3.87–5.11)
RDW: 12.7 % (ref 11.5–15.5)
WBC: 20.6 10*3/uL — AB (ref 4.0–10.5)

## 2014-05-09 LAB — BLOOD GAS, ARTERIAL
Acid-base deficit: 4.6 mmol/L — ABNORMAL HIGH (ref 0.0–2.0)
Bicarbonate: 19.6 mEq/L — ABNORMAL LOW (ref 20.0–24.0)
Drawn by: 419771
FIO2: 0.4 %
LHR: 16 {breaths}/min
MECHVT: 440 mL
O2 SAT: 98.4 %
PCO2 ART: 33.6 mmHg — AB (ref 35.0–45.0)
PEEP/CPAP: 5 cmH2O
PH ART: 7.383 (ref 7.350–7.450)
Patient temperature: 98.6
TCO2: 20.6 mmol/L (ref 0–100)
pO2, Arterial: 121 mmHg — ABNORMAL HIGH (ref 80.0–100.0)

## 2014-05-09 LAB — GLUCOSE, CAPILLARY
GLUCOSE-CAPILLARY: 193 mg/dL — AB (ref 70–99)
GLUCOSE-CAPILLARY: 238 mg/dL — AB (ref 70–99)
Glucose-Capillary: 269 mg/dL — ABNORMAL HIGH (ref 70–99)

## 2014-05-09 LAB — LACTIC ACID, PLASMA: Lactic Acid, Venous: 1.9 mmol/L (ref 0.5–2.0)

## 2014-05-09 MED ORDER — VITAL HIGH PROTEIN PO LIQD
1000.0000 mL | ORAL | Status: DC
  Administered 2014-05-10 (×2): 1000 mL
  Filled 2014-05-09 (×3): qty 1000

## 2014-05-09 MED ORDER — CLONIDINE HCL 0.2 MG PO TABS
0.2000 mg | ORAL_TABLET | Freq: Two times a day (BID) | ORAL | Status: DC
  Administered 2014-05-09 – 2014-05-10 (×3): 0.2 mg via ORAL
  Filled 2014-05-09 (×4): qty 1

## 2014-05-09 MED ORDER — PRO-STAT SUGAR FREE PO LIQD
30.0000 mL | Freq: Every day | ORAL | Status: DC
  Administered 2014-05-09 – 2014-05-11 (×10): 30 mL
  Filled 2014-05-09 (×15): qty 30

## 2014-05-09 MED ORDER — VITAL HIGH PROTEIN PO LIQD
1000.0000 mL | ORAL | Status: DC
  Filled 2014-05-09 (×2): qty 1000

## 2014-05-09 MED ORDER — SODIUM CHLORIDE 0.9 % IV SOLN
INTRAVENOUS | Status: DC
  Administered 2014-05-09: 20:00:00 via INTRAVENOUS
  Administered 2014-05-09 – 2014-05-10 (×2): 50 mL via INTRAVENOUS

## 2014-05-09 MED ORDER — SODIUM CHLORIDE 0.9 % IV BOLUS (SEPSIS)
500.0000 mL | Freq: Once | INTRAVENOUS | Status: AC
  Administered 2014-05-09: 500 mL via INTRAVENOUS

## 2014-05-09 NOTE — Progress Notes (Signed)
PULMONARY / CRITICAL CARE MEDICINE   Name: Morgan [name] MRN: [id number] DOB: May 24, 1940    ADMISSION DATE:  05/03/2014 CONSULTATION DATE:  05/08/2014  REFERRING MD :  Dr. Christella Noa  CHIEF COMPLAINT:  Meningioma Ressection  INITIAL PRESENTATION: 73 F initially admitted 1/24 with AMS.  Found to have seizures in the setting of a meningioma. She underwent resection on 05/08/2014 and remained intubated post-operatively for being a difficult intubation and the late hour. PCCM consulted for vent management.  STUDIES:  Head CT/Brain MRI 1/24 >>>enlargement of known meningioma and mild vasogenic edema  SIGNIFICANT EVENTS: Seizure on 1/24 Meningioma resection 1/29  SUBJECTIVE:  Intermittent agitation overnight. Tol PS wean.   VITAL SIGNS: Temp:  [97.4 F (36.3 C)-99 F (37.2 C)] 99 F (37.2 C) (01/30 0800) Pulse Rate:  [29-100] 34 (01/30 0800) Resp:  [0-31] 20 (01/30 0800) BP: (102-182)/(55-93) 158/66 mmHg (01/30 0800) SpO2:  [97 %-100 %] 97 % (01/30 0800) Arterial Line BP: (83-213)/(57-90) 128/86 mmHg (01/30 0800) FiO2 (%):  [40 %] 40 % (01/30 0800) Weight:  [241 lb 13.5 oz (109.7 kg)] 241 lb 13.5 oz (109.7 kg) (01/29 1731) HEMODYNAMICS:   VENTILATOR SETTINGS: Vent Mode:  [-] PSV;CPAP FiO2 (%):  [40 %] 40 % Set Rate:  [16 bmp] 16 bmp Vt Set:  [440 mL] 440 mL PEEP:  [5 cmH20] 5 cmH20 Pressure Support:  [5 cmH20] 5 cmH20 Plateau Pressure:  [9 cmH20-16 cmH20] 13 cmH20 INTAKE / OUTPUT:  Intake/Output Summary (Last 24 hours) at 05/09/14 0839 Last data filed at 05/09/14 0800  Gross per 24 hour  Intake 2691.33 ml  Output   2890 ml  Net -198.67 ml    PHYSICAL EXAMINATION: General:  Obese F, NAD  Neuro:  Moves L side spontaneously. Opens L eye intermittently, Does not follow commands. HEENT:  Sclera anicteric, conjunctiva pink, MMM, ETT present, R eye swelling  Cardiovascular:  RRR, NS1/S2, (-) MRG Lungs:  resps even non labored on PS 5/5, CTAB although exam limited by body  habitus Abdomen:  Obese, NT/ND/(+)BS Musculoskeletal:  (-) C/C/E Skin:  Intact  LABS:  CBC  Recent Labs Lab 05/07/14 2000 05/08/14 0416 05/09/14 0400  WBC 8.2 8.2 20.6*  HGB 12.9 12.4 11.6*  HCT 37.5 35.6* 33.2*  PLT 185 172 224   Coag's  Recent Labs Lab 05/03/14 1947 05/07/14 2000  APTT 29 23*  INR 1.29 1.11   BMET  Recent Labs Lab 05/07/14 0435 05/08/14 0416 05/09/14 0400  NA 133* 135 132*  K 4.3 4.2 4.3  CL 103 104 105  CO2 24 27 21   BUN 29* 28* 34*  CREATININE 1.09 1.02 1.19*  GLUCOSE 246* 200* 233*   Electrolytes  Recent Labs Lab 05/07/14 0435 05/08/14 0416 05/09/14 0400  CALCIUM 8.4 8.5 8.3*   Sepsis Markers  Recent Labs Lab 05/03/14 2040 05/04/14 0100  LATICACIDVEN 2.12* 2.6*   ABG  Recent Labs Lab 05/08/14 2103 05/09/14 0500  PHART 7.351 7.383  PCO2ART 38.2 33.6*  PO2ART 107.0* 121.0*   Liver Enzymes  Recent Labs Lab 05/03/14 1947 05/07/14 0435 05/09/14 0400  AST 67* 47* 68*  ALT 54* 47* 63*  ALKPHOS 165* 120* 105  BILITOT 0.7 0.7 0.8  ALBUMIN 3.2* 2.6* 2.6*   Cardiac Enzymes No results for input(s): TROPONINI, PROBNP in the last 168 hours. Glucose  Recent Labs Lab 05/07/14 1708 05/07/14 2204 05/08/14 0842 05/08/14 1129 05/08/14 1742 05/09/14 0740  GLUCAP 198* 199* 222* 200* 193* 193*    Imaging Dg Chest  Port 1 View  05/08/2014   CLINICAL DATA:  Encounter for central line placement and intubation.  EXAM: PORTABLE CHEST - 1 VIEW  COMPARISON:  05/03/2014  FINDINGS: Endotracheal tube tip projects 3.1 cm above the carina, well positioned.  New right internal jugular central venous line has its tip in the lower superior vena cava. No pneumothorax.  Cardiopericardial silhouette is mildly enlarged. No mediastinal or hilar masses.  Clear lungs.  IMPRESSION: 1. Endotracheal tube well positioned. 2. Right internal jugular central venous line tip in the lower superior vena cava. No pneumothorax.   Electronically Signed    By: Lajean Manes M.D.   On: 05/08/2014 17:54   Dg Abd Portable 1v  05/08/2014   CLINICAL DATA:  Feeding tube placement  EXAM: PORTABLE ABDOMEN - 1 VIEW  COMPARISON:  None.  FINDINGS: Nasogastric tube with the tip projecting over the antrum of the stomach with kinking and the tip directed towards the body of the stomach. There is no bowel dilatation to suggest obstruction. Mild gaseous distention of small bowel and colon. There is no evidence of pneumoperitoneum, portal venous gas or pneumatosis. There are no pathologic calcifications along the expected course of the ureters.The osseous structures are unremarkable.  IMPRESSION: Nasogastric tube with the tip projecting over the antrum of the stomach with kinking and the tip directed towards the body of the stomach.   Electronically Signed   By: Kathreen Devoid   On: 05/08/2014 20:45     ASSESSMENT / PLAN:  PULMONARY OETT 1/29>>> Acute respiratory failure - post op neurosurgery.  Reported difficult intubation.  Hx COPD P:   Cont PS wean as tol  VAP Prevention Daily SBT, cpap 5 ps 5, goal 30 min  Re evaluate neurostatus and airway protection skills Cont duonebs  Hold home symbicort   CARDIOVASCULAR CVL: LIJ >>>05/08/2014 HTN: ?c/b possibly rebound HTN from clonidine DC. Bradycardia: Poorly documented from earlier today but HR recorded as 29-34 from 5PM-7PM in EPIC. Not currently bradycardic. No obvious hemodynamic consequences by EPIC record review. P:  Pain Control Increase clonidine to 0.2 mcg bid Restart home BP meds as required (Felodipine, Lasix, Lisinopril, Metoprolol XL) Monitor for recurrent bradycardia  RENAL A:  Hyponatremia CKD III: Cr trending up  P:   Start gentle volume  F/u chem, lactate at 2pm   GASTROINTESTINAL A:  GERD P:   Continue PPI Initiate TF 1/30 if not extubated  HEMATOLOGIC A:  No acute issues P:  F/u cbc   INFECTIOUS A:  No acute issues P:  Monitor wbc, fever curve off abx   ENDOCRINE A:   DM: Poorly controlled thus far.   Hypothyroidism P:   SSI Continue levothyroxine  NEUROLOGIC A:  Seizures 2/2 meningioma: Management per NSH/Neuro Peripheral Neuropathy Depression P:   Follow-up neuro/NSG consults Holding OP Meds Limit , dc versed fent prn WUA full  FAMILY  - Updates: no family available 1/30 - Inter-disciplinary family meet or Palliative Care meeting due by:  2/5  Nickolas Madrid, NP 05/09/2014  8:39 AM Pager: [phone] or [phone]  STAFF NOTE: Linwood Dibbles, MD FACP have personally reviewed patient's available data, including medical history, events of note, physical examination and test results as part of my evaluation. I have discussed with resident/NP and other care providers such as pharmacist, RN and RRT. In addition, I personally evaluated patient and elicited key findings of: neurostatus improved, WUA mandatory, dc versed, ent, wean cpap 5ps, goal 30 min , assess rsbi The  patient is critically ill with multiple organ systems failure and requires high complexity decision making for assessment and support, frequent evaluation and titration of therapies, application of advanced monitoring technologies and extensive interpretation of multiple databases.   Critical Care Time devoted to patient care services described in this note is30 Minutes. This time reflects time of care of this signee: Merrie Roof, MD FACP. This critical care time does not reflect procedure time, or teaching time or supervisory time of PA/NP/Med student/Med Resident etc but could involve care discussion time. Rest per NP/medical resident whose note is outlined above and that I agree with   Lavon Paganini. Titus Mould, MD, [place] Pgr: [facility] 05/09/2014 10:47 AM

## 2014-05-09 NOTE — Progress Notes (Signed)
Pt seen and examined. No issues overnight. Pt intubated.  EXAM: Temp:  [97.7 F (36.5 C)-99 F (37.2 C)] 99 F (37.2 C) (01/30 0800) Pulse Rate:  [29-100] 59 (01/30 1111) Resp:  [0-31] 18 (01/30 1111) BP: (102-182)/(55-93) 116/68 mmHg (01/30 1111) SpO2:  [96 %-100 %] 96 % (01/30 1000) Arterial Line BP: (83-213)/(57-90) 128/86 mmHg (01/30 1000) FiO2 (%):  [40 %] 40 % (01/30 1111) Weight:  [109.7 kg (241 lb 13.5 oz)] 109.7 kg (241 lb 13.5 oz) (01/29 1731) Intake/Output      01/29 0701 - 01/30 0700 01/30 0701 - 01/31 0700   P.O. 360    I.V. (mL/kg) 2011.3 (18.3) 150 (1.4)   IV Piggyback 395 145   Total Intake(mL/kg) 2766.3 (25.2) 295 (2.7)   Urine (mL/kg/hr) 2000 (0.8) 455 (0.9)   Blood 700 (0.3)    Total Output 2700 455   Net +66.3 -160         Somnolent but arousable to voice Opens eyes Follows commands all extremities Headwrap in place, c/d/i  LABS: Lab Results  Component Value Date   CREATININE 1.19* 05/09/2014   BUN 34* 05/09/2014   NA 132* 05/09/2014   K 4.3 05/09/2014   CL 105 05/09/2014   CO2 21 05/09/2014   Lab Results  Component Value Date   WBC 20.6* 05/09/2014   HGB 11.6* 05/09/2014   HCT 33.2* 05/09/2014   MCV 87.1 05/09/2014   PLT 224 05/09/2014    IMPRESSION: - 74 y.o. female POD#1 s/p resection of right frontal meningioma - Remains intubated but neurologically appears ok  PLAN: - cont to observe, likely extubate when more alert - SBP goal < 180mHg - Cont AED's (Keppra/Vimpat) - cont decadron

## 2014-05-09 NOTE — Progress Notes (Signed)
INITIAL NUTRITION ASSESSMENT  DOCUMENTATION CODES Per approved criteria  -Morbid Obesity   INTERVENTION: Initiate Vital High Protein @ 30 ml/hr via OG tube  30 ml Prostat five times per day.    Tube feeding regimen provides 1220 kcal (70% of needs), 138 grams of protein, and 601 ml of H2O.   NUTRITION DIAGNOSIS: Inadequate oral intake related to inability to eat as evidenced by NPO status  Goal: Enteral nutrition to provide 60-70% of estimated calorie needs (22-25 kcals/kg ideal body weight) and 100% of estimated protein needs, based on ASPEN guidelines for hypocaloric, high protein feeding in critically ill obese individuals  Monitor:  Vent status, TF tolerance, adequacy, labs  Reason for Assessment: Consult received to initiate and manage enteral nutrition support.  74 y.o. female  Admitting Dx: Meningioma Ressection  ASSESSMENT: Pt admitted 1/24 with AMS, found to have seizures in the setting of meningioma. S/p resection 1/29 and remained intubated.   Patient is currently intubated on ventilator support MV: 8.5 L/min Temp (24hrs), Avg:98.2 F (36.8 C), Min:97.7 F (36.5 C), Max:99 F (37.2 C)   Height: Ht Readings from Last 1 Encounters:  05/08/14 5\' 4"  (1.626 m)    Weight: Wt Readings from Last 1 Encounters:  05/08/14 241 lb 13.5 oz (109.7 kg)    Ideal Body Weight: 54.5 kg   % Ideal Body Weight: 201%  Wt Readings from Last 10 Encounters:  05/08/14 241 lb 13.5 oz (109.7 kg)  01/04/12 240 lb 8 oz (109.09 kg)  09/11/11 243 lb 9.6 oz (110.496 kg)  03/23/11 243 lb 8 oz (110.451 kg)  12/15/10 248 lb 9.6 oz (112.764 kg)  11/02/10 251 lb (113.853 kg)  08/29/10 247 lb 12.8 oz (112.401 kg)    Usual Body Weight: 240's    % Usual Body Weight: 100%  BMI:  Body mass index is 41.49 kg/(m^2).  Estimated Nutritional Needs: Kcal: 1751 Protein: >/= 136 grams Fluid: > 1.7 L/day  Skin: intact  Diet Order:    EDUCATION NEEDS: -No education needs identified  at this time   Intake/Output Summary (Last 24 hours) at 05/09/14 1048 Last data filed at 05/09/14 1017  Gross per 24 hour  Intake 2476.33 ml  Output   3155 ml  Net -678.67 ml    Last BM: 1/23   Labs:   Recent Labs Lab 05/07/14 0435 05/08/14 0416 05/09/14 0400  NA 133* 135 132*  K 4.3 4.2 4.3  CL 103 104 105  CO2 24 27 21   BUN 29* 28* 34*  CREATININE 1.09 1.02 1.19*  CALCIUM 8.4 8.5 8.3*  GLUCOSE 246* 200* 233*    CBG (last 3)   Recent Labs  05/08/14 1129 05/08/14 1742 05/09/14 0740  GLUCAP 200* 193* 193*    Scheduled Meds: . anastrozole  1 mg Oral Daily  . antiseptic oral rinse  7 mL Mouth Rinse QID  . chlorhexidine  15 mL Mouth Rinse BID  . cloNIDine  0.2 mg Oral BID  . dexamethasone  4 mg Intravenous 4 times per day  . feeding supplement (VITAL HIGH PROTEIN)  1,000 mL Per Tube Q24H  . insulin aspart  0-15 Units Subcutaneous 6 times per day  . lacosamide (VIMPAT) IV  200 mg Intravenous Q12H  . levETIRAcetam  1,500 mg Intravenous Q12H  . levothyroxine  200 mcg Oral Once per day on Mon Tue Wed Thu Fri Sat   And  . [START ON 05/10/2014] levothyroxine  300 mcg Oral Once per day on Sun  .  pantoprazole (PROTONIX) IV  40 mg Intravenous QHS  . senna  1 tablet Oral BID    Continuous Infusions: . sodium chloride 50 mL (05/09/14 0845)    Past Medical History  Diagnosis Date  . Diabetes mellitus   . Obesity   . Diabetes type 2, uncontrolled   . Hypoglycemia associated with diabetes   . Edema of both legs   . Hypertension   . Combined hyperlipidemia   . Acquired autoimmune hypothyroidism   . Thyroiditis, autoimmune   . Abnormal liver function tests   . Sleep apnea, obstructive   . History of gastroesophageal reflux (GERD)   . Depression   . DM neuropathy with neurologic complication   . COPD with acute exacerbation   . Goiter   . Fatigue   . Vertigo   . Type II diabetes mellitus with peripheral angiopathy   . Gout   . Pallor     Past Surgical  History  Procedure Laterality Date  . Laparoscopic gastric banding    . Back surgery      [place], [place], [place] Pager [phone] After Hours Pager

## 2014-05-09 NOTE — Progress Notes (Signed)
Subjective: Continues to be intubated  Exam: Filed Vitals:   05/09/14 1900  BP: 94/54  Pulse: 63  Temp:   Resp: 19   Gen: In bed, NAD MS: awakens to noxious stimulus, follows simple commands HW:KGSUP, has vision in the left hemifield  Motor: Wiggles toes and moves hands voluntarily. Sensory: Responds to noxious stimuli 4   Impression: 74 yo F with new onset seizure in the setting of a right frontal meningioma. No obvious seizure since surgery.   Recommendations: 1) Continue keppra 1013m BID 2) continue vimpat 2053mBID 3) ativan PRN seizure flurry  McRoland RackMD [facility] [phone] 7pm- 7am, please page neurology on call as listed in [place] [facility] [place]

## 2014-05-10 ENCOUNTER — Inpatient Hospital Stay (HOSPITAL_COMMUNITY): Payer: Medicare Other

## 2014-05-10 DIAGNOSIS — J9600 Acute respiratory failure, unspecified whether with hypoxia or hypercapnia: Secondary | ICD-10-CM | POA: Insufficient documentation

## 2014-05-10 LAB — CBC
HCT: 30.7 % — ABNORMAL LOW (ref 36.0–46.0)
Hemoglobin: 10.5 g/dL — ABNORMAL LOW (ref 12.0–15.0)
MCH: 30.3 pg (ref 26.0–34.0)
MCHC: 34.2 g/dL (ref 30.0–36.0)
MCV: 88.5 fL (ref 78.0–100.0)
Platelets: 146 10*3/uL — ABNORMAL LOW (ref 150–400)
RBC: 3.47 MIL/uL — AB (ref 3.87–5.11)
RDW: 13 % (ref 11.5–15.5)
WBC: 13.4 10*3/uL — ABNORMAL HIGH (ref 4.0–10.5)

## 2014-05-10 LAB — GLUCOSE, CAPILLARY
GLUCOSE-CAPILLARY: 242 mg/dL — AB (ref 70–99)
GLUCOSE-CAPILLARY: 274 mg/dL — AB (ref 70–99)
GLUCOSE-CAPILLARY: 284 mg/dL — AB (ref 70–99)
GLUCOSE-CAPILLARY: 286 mg/dL — AB (ref 70–99)
GLUCOSE-CAPILLARY: 288 mg/dL — AB (ref 70–99)
GLUCOSE-CAPILLARY: 354 mg/dL — AB (ref 70–99)
Glucose-Capillary: 233 mg/dL — ABNORMAL HIGH (ref 70–99)
Glucose-Capillary: 214 mg/dL — ABNORMAL HIGH (ref 70–99)
Glucose-Capillary: 214 mg/dL — ABNORMAL HIGH (ref 70–99)
Glucose-Capillary: 153 mg/dL — ABNORMAL HIGH (ref 70–99)
Glucose-Capillary: 227 mg/dL — ABNORMAL HIGH (ref 70–99)
Glucose-Capillary: 334 mg/dL — ABNORMAL HIGH (ref 70–99)

## 2014-05-10 LAB — BASIC METABOLIC PANEL
Anion gap: 7 (ref 5–15)
BUN: 38 mg/dL — AB (ref 6–23)
CALCIUM: 7.9 mg/dL — AB (ref 8.4–10.5)
CHLORIDE: 107 mmol/L (ref 96–112)
CO2: 21 mmol/L (ref 19–32)
Creatinine, Ser: 1.09 mg/dL (ref 0.50–1.10)
GFR calc Af Amer: 57 mL/min — ABNORMAL LOW (ref >90)
GFR, EST NON AFRICAN AMERICAN: 49 mL/min — AB (ref >90)
Glucose, Bld: 314 mg/dL — ABNORMAL HIGH (ref 70–99)
Potassium: 4.1 mmol/L (ref 3.5–5.1)
SODIUM: 135 mmol/L (ref 135–145)

## 2014-05-10 LAB — CULTURE, BLOOD (ROUTINE X 2)
Culture: NO GROWTH
Culture: NO GROWTH

## 2014-05-10 MED ORDER — INSULIN GLARGINE 100 UNIT/ML ~~LOC~~ SOLN
10.0000 [IU] | Freq: Every day | SUBCUTANEOUS | Status: DC
  Administered 2014-05-10: 10 [IU] via SUBCUTANEOUS
  Filled 2014-05-10 (×2): qty 0.1

## 2014-05-10 MED ORDER — CLONIDINE HCL 0.3 MG PO TABS
0.3000 mg | ORAL_TABLET | Freq: Two times a day (BID) | ORAL | Status: DC
  Administered 2014-05-10: 0.3 mg via ORAL
  Filled 2014-05-10 (×3): qty 1

## 2014-05-10 NOTE — Progress Notes (Signed)
Subjective: Continues to be intubated  Exam: Filed Vitals:   05/10/14 0733  BP:   Pulse:   Temp: 99 F (37.2 C)  Resp:    Gen: In bed, NAD MS: awakens to noxious stimulus, follows simple commands [id number], does not clearly look to either side.  Motor: Wiggles toes and moves hands voluntarily. Sensory: Responds to noxious stimuli 4   Impression: 74 yo F with new onset seizure in the setting of a right frontal meningioma. No obvious seizure since surgery.    Recommendations: 1) Continue keppra 109m BID 2) continue vimpat 2076mBID 3) ativan PRN seizure flurry  McRoland RackMD [facility] [phone] 7pm- 7am, please page neurology on call as listed in [facility]

## 2014-05-10 NOTE — Progress Notes (Signed)
Pt seen and examined. No issues overnight. Remains intubated.  EXAM: Temp:  [98.1 F (36.7 C)-99.1 F (37.3 C)] 99 F (37.2 C) (01/31 0733) Pulse Rate:  [40-82] 63 (01/31 0719) Resp:  [0-26] 19 (01/31 0719) BP: (94-184)/(49-137) 136/64 mmHg (01/31 0719) SpO2:  [96 %-100 %] 99 % (01/31 0700) Arterial Line BP: (128)/(86) 128/86 mmHg (01/30 1000) FiO2 (%):  [30 %-40 %] 30 % (01/31 0719) Weight:  [107.3 kg (236 lb 8.9 oz)] 107.3 kg (236 lb 8.9 oz) (01/31 0457) Intake/Output      01/30 0701 - 01/31 0700 01/31 0701 - 02/01 0700   P.O. 1    I.V. (mL/kg) 1200 (11.2)    IV Piggyback 335    Total Intake(mL/kg) 1536 (14.3)    Urine (mL/kg/hr) 1975 (0.8)    Blood     Total Output 1975     Net -439           Opens left eye briskly to voice (right eye swollen) Follows commands symmetrically all extremities Headwrap in place  LABS: Lab Results  Component Value Date   CREATININE 1.09 05/10/2014   BUN 38* 05/10/2014   NA 135 05/10/2014   K 4.1 05/10/2014   CL 107 05/10/2014   CO2 21 05/10/2014   Lab Results  Component Value Date   WBC 13.4* 05/10/2014   HGB 10.5* 05/10/2014   HCT 30.7* 05/10/2014   MCV 88.5 05/10/2014   PLT 146* 05/10/2014    IMPRESSION: - 74 y.o. female POD#2 s/p resection right frontal meningioma - VDRF, but neurologically stable  PLAN: - Extubate per PCCM - Cont systolic goal < [id number], PRN labetalol - Keppra/Vimpat, no evidence for postop SZ - Will cont Decadron at 4q6 till extubated

## 2014-05-10 NOTE — Progress Notes (Signed)
PULMONARY / CRITICAL CARE MEDICINE   Name: Morgan [name] MRN: [id number] DOB: [birth year]    ADMISSION DATE:  05/03/2014 CONSULTATION DATE:  05/08/2014  REFERRING MD :  Dr. Christella Noa  CHIEF COMPLAINT:  Meningioma Ressection  INITIAL PRESENTATION: 50 F initially admitted 1/24 with AMS.  Found to have seizures in the setting of a meningioma. She underwent resection on 05/08/2014 and remained intubated post-operatively for being a difficult intubation and the late hour. PCCM consulted for vent management.  STUDIES:  Head CT/Brain MRI 1/24 >>>enlargement of known meningioma and mild vasogenic edema  SIGNIFICANT EVENTS: Seizure on 1/24 Meningioma resection 1/29  SUBJECTIVE:  Tol PS wean, slightly more awake but still only following commands intermittently.  Requires stimulation to stay awake.  Intermittent HTN overnight as well requiring PRN Labetalol   VITAL SIGNS: Temp:  [98.1 F (36.7 C)-99.1 F (37.3 C)] 99 F (37.2 C) (01/31 0733) Pulse Rate:  [40-85] 85 (01/31 0900) Resp:  [16-26] 19 (01/31 0900) BP: (94-184)/(49-137) 120/71 mmHg (01/31 1051) SpO2:  [98 %-100 %] 99 % (01/31 0900) FiO2 (%):  [30 %-40 %] 30 % (01/31 0900) Weight:  [236 lb 8.9 oz (107.3 kg)] 236 lb 8.9 oz (107.3 kg) (01/31 0457) HEMODYNAMICS:   VENTILATOR SETTINGS: Vent Mode:  [-] PSV;CPAP FiO2 (%):  [30 %-40 %] 30 % Set Rate:  [16 bmp] 16 bmp Vt Set:  [440 mL] 440 mL PEEP:  [5 cmH20] 5 cmH20 Pressure Support:  [5 cmH20-8 cmH20] 5 cmH20 Plateau Pressure:  [13 cmH20-14 cmH20] 13 cmH20 INTAKE / OUTPUT:  Intake/Output Summary (Last 24 hours) at 05/10/14 1111 Last data filed at 05/10/14 0900  Gross per 24 hour  Intake   1426 ml  Output   1895 ml  Net   -469 ml    PHYSICAL EXAMINATION: General:  Obese F, NAD  Neuro:  Follows commands intermittently but requires stimulation to stay awake, MAE, gen weakness  HEENT:  Sclera anicteric, conjunctiva pink, MMM, ETT present, increased R eye swelling   Cardiovascular:  RRR, NS1/S2, (-) MRG Lungs:  resps even non labored on PS 8/5, CTAB although exam limited by body habitus Abdomen:  Obese, NT/ND/(+)BS Musculoskeletal:  (-) C/C/E Skin:  Intact  LABS:  CBC  Recent Labs Lab 05/08/14 0416 05/09/14 0400 05/10/14 0452  WBC 8.2 20.6* 13.4*  HGB 12.4 11.6* 10.5*  HCT 35.6* 33.2* 30.7*  PLT 172 224 146*   Coag's  Recent Labs Lab 05/03/14 1947 05/07/14 2000  APTT 29 23*  INR 1.29 1.11   BMET  Recent Labs Lab 05/09/14 0400 05/09/14 1800 05/10/14 0452  NA 132* 135 135  K 4.3 4.2 4.1  CL 105 108 107  CO2 21 24 21   BUN 34* 38* 38*  CREATININE 1.19* 1.14* 1.09  GLUCOSE 233* 273* 314*   Electrolytes  Recent Labs Lab 05/09/14 0400 05/09/14 1800 05/10/14 0452  CALCIUM 8.3* 7.9* 7.9*   Sepsis Markers  Recent Labs Lab 05/03/14 2040 05/04/14 0100 05/09/14 1800  LATICACIDVEN 2.12* 2.6* 1.9   ABG  Recent Labs Lab 05/08/14 2103 05/09/14 0500  PHART 7.351 7.383  PCO2ART 38.2 33.6*  PO2ART 107.0* 121.0*   Liver Enzymes  Recent Labs Lab 05/03/14 1947 05/07/14 0435 05/09/14 0400  AST 67* 47* 68*  ALT 54* 47* 63*  ALKPHOS 165* 120* 105  BILITOT 0.7 0.7 0.8  ALBUMIN 3.2* 2.6* 2.6*   Cardiac Enzymes No results for input(s): TROPONINI, PROBNP in the last 168 hours. Glucose  Recent Labs Lab 05/09/14  1546 05/09/14 2016 05/09/14 2027 05/09/14 2355 05/10/14 0443 05/10/14 0730  GLUCAP 153* 161* 269* 238* 284* 286*    Imaging No results found.   ASSESSMENT / PLAN:  PULMONARY OETT 1/29>>> Acute respiratory failure - post op neurosurgery.  Reported difficult intubation.  Hx COPD P:   Cont PS wean as tol - mental status cont to limit extubation  VAP Prevention Daily SBT Cont duonebs  Hold home symbicort   CARDIOVASCULAR CVL: LIJ 1/29>>> HTN: ?c/b possibly rebound HTN from clonidine DC. Bradycardia: Poorly documented.  Appears resolved.  P:  Pain Control Increase clonidine to 0.3  mcg bid 1/31 (home dose)  Restart home BP meds as required (Felodipine, Lasix, Lisinopril, Metoprolol XL) Monitor for recurrent bradycardia  RENAL A:  Hyponatremia CKD III: Cr stable  P:   Cont gentle volume  F/u chem  GASTROINTESTINAL A:  GERD P:   Continue PPI TF   HEMATOLOGIC A:  No acute issues P:  F/u cbc   INFECTIOUS A:  No acute issues P:  Monitor wbc, fever curve off abx   ENDOCRINE A:  DM: Poorly controlled thus far.   Hypothyroidism P:   SSI Add lantus 1/31 Continue levothyroxine  NEUROLOGIC A:  Seizures 2/2 meningioma: Management per [facility]/Neuro Peripheral Neuropathy Depression P:   Follow-up neuro/NSG consults Holding OP Meds PRN fentanyl    FAMILY  - Updates: son updated at length at bedside 1/31 - Inter-disciplinary family meet or Palliative Care meeting due by:  2/5   Nickolas Madrid, NP 05/10/2014  11:11 AM Pager: (336) [phone] or [phone]   PCCM ATTENDING: I have reviewed pt's initial presentation, consultants notes and hospital database in detail.  The above assessment and plan was formulated under my direction.    Merton Border, MD;  PCCM service; Mobile [phone]

## 2014-05-11 ENCOUNTER — Inpatient Hospital Stay (HOSPITAL_COMMUNITY): Payer: Medicare Other

## 2014-05-11 ENCOUNTER — Encounter (HOSPITAL_COMMUNITY): Payer: Self-pay | Admitting: Neurosurgery

## 2014-05-11 LAB — GLUCOSE, CAPILLARY
GLUCOSE-CAPILLARY: 351 mg/dL — AB (ref 70–99)
Glucose-Capillary: 161 mg/dL — ABNORMAL HIGH (ref 70–99)
Glucose-Capillary: 277 mg/dL — ABNORMAL HIGH (ref 70–99)
Glucose-Capillary: 333 mg/dL — ABNORMAL HIGH (ref 70–99)
Glucose-Capillary: 351 mg/dL — ABNORMAL HIGH (ref 70–99)
Glucose-Capillary: 374 mg/dL — ABNORMAL HIGH (ref 70–99)

## 2014-05-11 LAB — BASIC METABOLIC PANEL
ANION GAP: 6 (ref 5–15)
BUN: 53 mg/dL — AB (ref 6–23)
CO2: 21 mmol/L (ref 19–32)
Calcium: 8.2 mg/dL — ABNORMAL LOW (ref 8.4–10.5)
Chloride: 111 mmol/L (ref 96–112)
Creatinine, Ser: 1.16 mg/dL — ABNORMAL HIGH (ref 0.50–1.10)
GFR calc Af Amer: 53 mL/min — ABNORMAL LOW (ref >90)
GFR calc non Af Amer: 46 mL/min — ABNORMAL LOW (ref >90)
GLUCOSE: 405 mg/dL — AB (ref 70–99)
Potassium: 4 mmol/L (ref 3.5–5.1)
Sodium: 138 mmol/L (ref 135–145)

## 2014-05-11 MED ORDER — CLONIDINE HCL 0.3 MG/24HR TD PTWK
0.3000 mg | MEDICATED_PATCH | TRANSDERMAL | Status: DC
  Administered 2014-05-11: 0.3 mg via TRANSDERMAL
  Filled 2014-05-11: qty 1

## 2014-05-11 MED ORDER — PANTOPRAZOLE SODIUM 40 MG IV SOLR
40.0000 mg | Freq: Every day | INTRAVENOUS | Status: DC
  Administered 2014-05-11: 40 mg via INTRAVENOUS
  Filled 2014-05-11: qty 40

## 2014-05-11 MED ORDER — INSULIN GLARGINE 100 UNIT/ML ~~LOC~~ SOLN
20.0000 [IU] | Freq: Every day | SUBCUTANEOUS | Status: DC
  Administered 2014-05-11 – 2014-05-15 (×5): 20 [IU] via SUBCUTANEOUS
  Filled 2014-05-11 (×7): qty 0.2

## 2014-05-11 MED ORDER — INSULIN ASPART 100 UNIT/ML ~~LOC~~ SOLN
0.0000 [IU] | SUBCUTANEOUS | Status: DC
  Administered 2014-05-11: 11 [IU] via SUBCUTANEOUS
  Administered 2014-05-11: 7 [IU] via SUBCUTANEOUS
  Administered 2014-05-11: 15 [IU] via SUBCUTANEOUS
  Administered 2014-05-12: 7 [IU] via SUBCUTANEOUS
  Administered 2014-05-12: 4 [IU] via SUBCUTANEOUS
  Administered 2014-05-12: 11 [IU] via SUBCUTANEOUS

## 2014-05-11 MED ORDER — LEVOTHYROXINE SODIUM 100 MCG IV SOLR
100.0000 ug | Freq: Every day | INTRAVENOUS | Status: DC
Start: 2014-05-12 — End: 2014-05-12
  Administered 2014-05-12: 100 ug via INTRAVENOUS
  Filled 2014-05-11: qty 5

## 2014-05-11 MED ORDER — METOPROLOL TARTRATE 1 MG/ML IV SOLN
2.5000 mg | INTRAVENOUS | Status: DC | PRN
  Filled 2014-05-11: qty 5

## 2014-05-11 MED ORDER — SODIUM CHLORIDE 0.9 % IV SOLN
INTRAVENOUS | Status: DC
  Administered 2014-05-11 – 2014-05-29 (×8): via INTRAVENOUS

## 2014-05-11 MED ORDER — HYDRALAZINE HCL 20 MG/ML IJ SOLN
10.0000 mg | INTRAMUSCULAR | Status: DC | PRN
  Administered 2014-05-11: 20 mg via INTRAVENOUS
  Administered 2014-05-12 – 2014-05-15 (×4): 40 mg via INTRAVENOUS
  Administered 2014-05-16 – 2014-05-18 (×2): 10 mg via INTRAVENOUS
  Filled 2014-05-11: qty 1
  Filled 2014-05-11 (×3): qty 2
  Filled 2014-05-11: qty 1
  Filled 2014-05-11: qty 2
  Filled 2014-05-11: qty 1

## 2014-05-11 MED ORDER — DEXAMETHASONE SODIUM PHOSPHATE 4 MG/ML IJ SOLN
2.0000 mg | Freq: Four times a day (QID) | INTRAMUSCULAR | Status: DC
  Administered 2014-05-11 – 2014-05-12 (×6): 2 mg via INTRAVENOUS
  Filled 2014-05-11 (×3): qty 0.5
  Filled 2014-05-11: qty 1
  Filled 2014-05-11: qty 0.5

## 2014-05-11 MED ORDER — FENTANYL CITRATE 0.05 MG/ML IJ SOLN: 12.5000 ug | INTRAMUSCULAR | Status: DC | PRN

## 2014-05-11 NOTE — Procedures (Signed)
Extubation Procedure Note  Patient Details:   Name: Morgan [name] DOB: 12/27/1940 MRN: [id number]   Airway Documentation:  Airway 7.5 mm (Active)  Secured at (cm) 21 cm 05/11/2014  7:12 AM  Measured From Lips 05/11/2014  7:12 AM  [place] 05/11/2014  7:12 AM  Secured By [employer] 05/11/2014  7:12 AM  Tube Holder Repositioned Yes 05/11/2014  7:12 AM  Cuff Pressure (cm H2O) 28 cm H2O 05/11/2014  7:12 AM  Site Condition Dry 05/11/2014  7:12 AM    Evaluation  O2 sats: stable throughout Complications: No apparent complications Patient did tolerate procedure well. Bilateral Breath Sounds: Clear, Rhonchi Suctioning: Airway Yes   Patient extubated to 2L nasal cannula per MD order.  Positive cuff leak noted.  No evidence of stridor.  Sats currently 100%.  Vitals are stable.  Attempted to perform incentive spirometry however patient would blow into spirometer rather than breathing in.  No apparent complications.    Alphia Moh N 05/11/2014, 9:20 AM

## 2014-05-11 NOTE — Progress Notes (Signed)
UR completed.  Pt was previous pt of [facility] for [facility] prior to this admit.  Sandi Mariscal, RN BSN [place] CCM Trauma/Neuro ICU Case Manager [phone]

## 2014-05-11 NOTE — Progress Notes (Signed)
PULMONARY / CRITICAL CARE MEDICINE   Name: Morgan [name] MRN: [id number] DOB: 1940/06/20    ADMISSION DATE:  05/03/2014 CONSULTATION DATE:  05/08/2014  REFERRING MD :  Dr. Christella Noa  CHIEF COMPLAINT:  Meningioma Ressection  INITIAL PRESENTATION:  45 F initially admitted 1/24 with AMS.  Found to have seizures in the setting of a meningioma. She underwent resection on 05/08/2014 and remained intubated post-operatively for being a difficult intubation and the late hour. PCCM consulted for vent management.  STUDIES:  Head CT/Brain MRI 1/24 >>>enlargement of known meningioma and mild vasogenic edema  SIGNIFICANT EVENTS: Seizure on 1/24 Meningioma resection 1/29  SUBJECTIVE:   Passed SBT. Cognition intact. Extubated and tolerating well  VITAL SIGNS: Temp:  [98.7 F (37.1 C)-100 F (37.8 C)] 99.1 F (37.3 C) (02/01 0800) Pulse Rate:  [57-123] 62 (02/01 1000) Resp:  [15-28] 20 (02/01 1000) BP: (84-197)/(36-128) 91/60 mmHg (02/01 1000) SpO2:  [96 %-100 %] 99 % (02/01 1000) FiO2 (%):  [30 %] 30 % (02/01 0712) Weight:  [107.2 kg (236 lb 5.3 oz)] 107.2 kg (236 lb 5.3 oz) (02/01 0430) HEMODYNAMICS:   VENTILATOR SETTINGS: Vent Mode:  [-] PSV;CPAP FiO2 (%):  [30 %] 30 % Set Rate:  [16 bmp] 16 bmp Vt Set:  [440 mL] 440 mL PEEP:  [5 cmH20] 5 cmH20 Pressure Support:  [5 cmH20-8 cmH20] 5 cmH20 Plateau Pressure:  [14 cmH20-16 cmH20] 14 cmH20 INTAKE / OUTPUT:  Intake/Output Summary (Last 24 hours) at 05/11/14 1053 Last data filed at 05/11/14 1000  Gross per 24 hour  Intake   2200 ml  Output   3275 ml  Net  -1075 ml    PHYSICAL EXAMINATION: General:  NAD, RASS 0 Neuro:  MAEs HEENT:  R periorbital edema Cardiovascular:  Reg, no M Lungs:  clear Abdomen:  Soft, NT, +BS Ext:  (-) C/C/E  LABS: I have reviewed all of today's lab results. Relevant abnormalities are discussed in the A/P section  CXR: NACPD   ASSESSMENT / PLAN:  PULMONARY OETT 1/29>>>2/01 Acute respiratory  failure - post op neurosurgery.  Hx COPD P:   Extubate Supp O2 Monitor in ICU  CARDIOVASCULAR CVL: LIJ 1/29>>> HTN, chronic clonidine use Bradycardia, resolved P:  Antihypertensive regimen reviewed and adjusted Change clonidine to topical until able to take PO meds  RENAL A:   Hyponatremia, resolved CKD P:   Monitor BMET intermittently Monitor I/Os Correct electrolytes as indicated  GASTROINTESTINAL A:   GERD, chronic PPI use P:   Continue PPI - change to PO when able NPO post extubation Will need SLP eval 2/02  HEMATOLOGIC A:   Mild anemia without acute bleeding Mild thrombocytopenia P:  DVT px: SCDs Monitor CBC intermittently Transfuse per usual ICU guidelines  INFECTIOUS A:   No acute issues P:  Monitor wbc, fever curve off abx   ENDOCRINE A:   DM 2, poor control exacerbated by systemic steroids Hypothyroidism P:   Increase lantus Change SSI to resistant scale Decreased dexamethasone dose NS to assess whether dexamethasone can be stopped altogether Continue levothyroxine  NEUROLOGIC A:   Post craniotomy for resection of meningioma Seizures Peripheral Neuropathy Depression P:   NS and neurology managing Holding OP Meds Low dose PRN fent  Merton Border, MD ; [facility] service Mobile [phone].  After 5:30 PM or weekends, call [phone]

## 2014-05-11 NOTE — Progress Notes (Signed)
Inpatient Diabetes Program Recommendations  AACE/ADA: New Consensus Statement on Inpatient Glycemic Control (2013)  Target Ranges:  Prepandial:   less than 140 mg/dL      Peak postprandial:   less than 180 mg/dL (1-2 hours)      Critically ill patients:  140 - 180 mg/dL     Results for KAHRI, SECHREST (MRN [id number]) as of 05/11/2014 08:21  Ref. Range 05/09/2014 23:55 05/10/2014 04:43 05/10/2014 07:30 05/10/2014 11:25 05/10/2014 15:28 05/10/2014 20:21  Glucose-Capillary Latest Range: 70-99 mg/dL 238 (H) 284 (H) 286 (H) 288 (H) 334 (H) 354 (H)    Results for AILLEEN, KHUONG (MRN [id number]) as of 05/11/2014 08:21  Ref. Range 05/10/2014 23:51 05/11/2014 03:39  Glucose-Capillary Latest Range: 70-99 mg/dL 374 (H) 351 (H)    Outpatient Diabetes medications: U500 insulin   Current Orders: Lantus 10 units QHS (started last PM)     Novolog Moderate SSI Q4 hours   **Note patient currently receiving Decadron 4 mg QID.  Underwent Craniotomy/Tumor resection on 01/29.  **Patient also receiving Vital HP Tube feeds @ 40 cc/hour.   MD- Patient was receiving Levemir 22 units bid prior to surgery on 01/29.  Note that Levemir was discontinued and patient was not restarted on any basal insulin until last PM (Lantus 10 units QHS).  Patient takes U-500 insulin at home which is five times as concentrated as regular U-100 insulin.  Patient required 55 units Novolog SSI yesterday (01/31).  MD- Please consider increasing basal insulin and adding Novolog tube feed coverage to help cover the carbohydrates in patient's tube feedings  1) Increase Lantus to 20 units bid (0.4 units/kg dosing for basal insulin- based on weight of 107 kg) 2) Add Novolog Tube Feed coverage- Novolog 4 units Q4 hours (hold if tube feeds held for any reason)     Will follow Wyn Quaker RN, MSN, CDE Diabetes Coordinator Inpatient Diabetes Program Team Pager: [phone] (8a-10p)

## 2014-05-11 NOTE — Progress Notes (Signed)
Patient ID: Morgan [name], female   DOB: 18-Oct-1940, 74 y.o.   MRN: [id number] BP 149/59 mmHg  Pulse 65  Temp(Src) 98.6 F (37 C) (Axillary)  Resp 19  Ht 5' 4"  (1.626 m)  Wt 107.2 kg (236 lb 5.3 oz)  BMI 40.55 kg/m2  SpO2 100% Alert and oriented x 4 Moving all extremities well Wound is clean, dry, and without signs of infection Will advance diet.

## 2014-05-12 LAB — GLUCOSE, CAPILLARY
GLUCOSE-CAPILLARY: 203 mg/dL — AB (ref 70–99)
GLUCOSE-CAPILLARY: 258 mg/dL — AB (ref 70–99)
Glucose-Capillary: 170 mg/dL — ABNORMAL HIGH (ref 70–99)
Glucose-Capillary: 192 mg/dL — ABNORMAL HIGH (ref 70–99)
Glucose-Capillary: 201 mg/dL — ABNORMAL HIGH (ref 70–99)
Glucose-Capillary: 230 mg/dL — ABNORMAL HIGH (ref 70–99)
Glucose-Capillary: 246 mg/dL — ABNORMAL HIGH (ref 70–99)
Glucose-Capillary: 260 mg/dL — ABNORMAL HIGH (ref 70–99)

## 2014-05-12 MED ORDER — INSULIN ASPART 100 UNIT/ML ~~LOC~~ SOLN
4.0000 [IU] | Freq: Three times a day (TID) | SUBCUTANEOUS | Status: DC
  Administered 2014-05-12 – 2014-05-19 (×11): 4 [IU] via SUBCUTANEOUS

## 2014-05-12 MED ORDER — LEVOTHYROXINE SODIUM 100 MCG PO TABS
200.0000 ug | ORAL_TABLET | Freq: Every day | ORAL | Status: DC
  Administered 2014-05-13 – 2014-05-14 (×2): 200 ug via ORAL
  Filled 2014-05-12: qty 3
  Filled 2014-05-12 (×2): qty 2

## 2014-05-12 MED ORDER — DEXAMETHASONE SODIUM PHOSPHATE 4 MG/ML IJ SOLN
2.0000 mg | Freq: Two times a day (BID) | INTRAMUSCULAR | Status: DC
  Administered 2014-05-13 – 2014-05-18 (×11): 2 mg via INTRAVENOUS
  Filled 2014-05-12 (×11): qty 1

## 2014-05-12 MED ORDER — INSULIN ASPART 100 UNIT/ML ~~LOC~~ SOLN
0.0000 [IU] | Freq: Three times a day (TID) | SUBCUTANEOUS | Status: DC
  Administered 2014-05-12: 7 [IU] via SUBCUTANEOUS
  Administered 2014-05-12: 4 [IU] via SUBCUTANEOUS
  Administered 2014-05-13 – 2014-05-14 (×3): 7 [IU] via SUBCUTANEOUS
  Administered 2014-05-14: 11 [IU] via SUBCUTANEOUS
  Administered 2014-05-14 – 2014-05-17 (×7): 4 [IU] via SUBCUTANEOUS
  Administered 2014-05-17: 7 [IU] via SUBCUTANEOUS
  Administered 2014-05-18: 3 [IU] via SUBCUTANEOUS
  Administered 2014-05-18: 4 [IU] via SUBCUTANEOUS
  Administered 2014-05-18 – 2014-05-19 (×2): 7 [IU] via SUBCUTANEOUS

## 2014-05-12 MED ORDER — CLONIDINE HCL 0.1 MG PO TABS
0.3000 mg | ORAL_TABLET | Freq: Two times a day (BID) | ORAL | Status: DC
  Administered 2014-05-12 – 2014-05-14 (×4): 0.3 mg via ORAL
  Filled 2014-05-12 (×6): qty 3

## 2014-05-12 MED ORDER — MOMETASONE FURO-FORMOTEROL FUM 100-5 MCG/ACT IN AERO
2.0000 | INHALATION_SPRAY | Freq: Two times a day (BID) | RESPIRATORY_TRACT | Status: DC
  Administered 2014-05-12 – 2014-05-29 (×33): 2 via RESPIRATORY_TRACT
  Filled 2014-05-12 (×2): qty 8.8

## 2014-05-12 MED ORDER — INSULIN ASPART 100 UNIT/ML ~~LOC~~ SOLN
0.0000 [IU] | Freq: Every day | SUBCUTANEOUS | Status: DC
  Administered 2014-05-12: 3 [IU] via SUBCUTANEOUS
  Administered 2014-05-13: 2 [IU] via SUBCUTANEOUS
  Administered 2014-05-14 – 2014-05-15 (×2): 3 [IU] via SUBCUTANEOUS
  Administered 2014-05-16: 2 [IU] via SUBCUTANEOUS

## 2014-05-12 MED ORDER — PANTOPRAZOLE SODIUM 40 MG PO TBEC
40.0000 mg | DELAYED_RELEASE_TABLET | Freq: Every day | ORAL | Status: DC
  Administered 2014-05-13 – 2014-05-20 (×7): 40 mg via ORAL
  Filled 2014-05-12 (×8): qty 1

## 2014-05-12 MED ORDER — FENTANYL CITRATE 0.05 MG/ML IJ SOLN: 12.5000 ug | INTRAMUSCULAR | Status: DC | PRN

## 2014-05-12 MED ORDER — ONDANSETRON HCL 4 MG/2ML IJ SOLN
4.0000 mg | Freq: Four times a day (QID) | INTRAMUSCULAR | Status: DC | PRN
  Administered 2014-05-12 – 2014-05-29 (×13): 4 mg via INTRAVENOUS
  Filled 2014-05-12 (×14): qty 2

## 2014-05-12 MED ORDER — SODIUM CHLORIDE 0.9 % IJ SOLN
10.0000 mL | INTRAMUSCULAR | Status: DC | PRN
  Administered 2014-05-13 – 2014-05-26 (×7): 10 mL
  Filled 2014-05-12 (×7): qty 40

## 2014-05-12 MED ORDER — LEVETIRACETAM 500 MG PO TABS
500.0000 mg | ORAL_TABLET | Freq: Three times a day (TID) | ORAL | Status: DC
  Administered 2014-05-12 – 2014-05-14 (×6): 500 mg via ORAL
  Filled 2014-05-12 (×7): qty 1

## 2014-05-12 NOTE — Progress Notes (Signed)
Patient arrived to 4N16 AAOx4 but nauseous from the ride up. Patient was transferred to the bed, scd's applied, and vital signs taken. Orders reviewed and will continue to monitor. Viriginia Amendola, Rande Brunt

## 2014-05-12 NOTE — Progress Notes (Signed)
Rehab Admissions Coordinator Note:  Patient was screened by Cleatrice Burke for appropriateness for an Inpatient Acute Rehab Consult per PT recommendation .`At this time, we are recommending Inpatient Rehab consult.  Cleatrice Burke 05/12/2014, 3:39 PM  I can be reached at [phone].

## 2014-05-12 NOTE — Progress Notes (Signed)
Patient ID: Morgan [name], female   DOB: [birth year], 74 y.o.   MRN: [id number] BP 167/67 mmHg  Pulse 83  Temp(Src) 98.8 F (37.1 C) (Oral)  Resp 23  Ht 5' 4"  (1.626 m)  Wt 108.1 kg (238 lb 5.1 oz)  BMI 40.89 kg/m2  SpO2 100% Alert and oriented x 4 speech is clear and fluent Moving all extremities  Perrl, full eom Wound is clean, dry, no signs of infection Will transfer to the floor, doing well

## 2014-05-12 NOTE — Evaluation (Signed)
Physical Therapy Evaluation Patient Details Name: Morgan [name] MRN: [id number] DOB: 1941-03-09 Today's Date: 05/12/2014   History of Present Illness  pt presents with Craniotomy post Meningioma Resection, Seizures, and VDRF.    Clinical Impression  Pt nauseated and generally lethargic limiting mobility and participation.  At this time requires 2 person A for mobility and would benefit from CIR at D/C to maximize independence prior to returning to home.  Will continue to follow.      Follow Up Recommendations CIR    Equipment Recommendations  None recommended by PT    Recommendations for Other Services Rehab consult     Precautions / Restrictions Precautions Precautions: Fall Restrictions Weight Bearing Restrictions: No      Mobility  Bed Mobility Overal bed mobility: Needs Assistance;+2 for physical assistance Bed Mobility: Supine to Sit;Sit to Supine     Supine to sit: Max assist;+2 for physical assistance Sit to supine: Max assist;+2 for physical assistance   General bed mobility comments: pt assists with mobility, though generally weak and debilitated.    Transfers                    Ambulation/Gait                Stairs            Wheelchair Mobility    Modified Rankin (Stroke Patients Only)       Balance Overall balance assessment: Needs assistance Sitting-balance support: Bilateral upper extremity supported;Feet supported Sitting balance-Leahy Scale: Poor Sitting balance - Comments: pt tends to lean anteriorly and needs consistent min - Mod A to maintain balance.   Postural control:  (Anterior Lean)                                   Pertinent Vitals/Pain Pain Assessment: Faces Faces Pain Scale: Hurts a little bit Pain Location: pt nauseated.    Home Living Family/patient expects to be discharged to:: Inpatient rehab                      Prior Function Level of Independence: Independent                Hand Dominance        Extremity/Trunk Assessment   Upper Extremity Assessment: Defer to OT evaluation           Lower Extremity Assessment: Generalized weakness      Cervical / Trunk Assessment: Normal  Communication   Communication: No difficulties  Cognition Arousal/Alertness: Lethargic Behavior During Therapy: Flat affect Overall Cognitive Status: Difficult to assess                      General Comments      Exercises        Assessment/Plan    PT Assessment Patient needs continued PT services  PT Diagnosis Difficulty walking;Generalized weakness   PT Problem List Decreased strength;Decreased activity tolerance;Decreased balance;Decreased mobility;Decreased coordination;Decreased knowledge of use of DME  PT Treatment Interventions DME instruction;Gait training;Stair training;Functional mobility training;Therapeutic activities;Therapeutic exercise;Balance training;Neuromuscular re-education;Patient/family education   PT Goals (Current goals can be found in the Care Plan section) Acute Rehab PT Goals Patient Stated Goal: Back to normal PT Goal Formulation: With patient Time For Goal Achievement: 05/26/14 Potential to Achieve Goals: Good    Frequency Min 3X/week   Barriers to discharge  Co-evaluation               End of Session Equipment Utilized During Treatment: Oxygen Activity Tolerance: Patient limited by fatigue Patient left: in bed;with call bell/phone within reach;with family/visitor present Nurse Communication: Mobility status         Time: [id number] PT Time Calculation (min) (ACUTE ONLY): 28 min   Charges:   PT Evaluation $Initial PT Evaluation Tier I: 1 Procedure PT Treatments $Therapeutic Activity: 8-22 mins   PT G CodesCatarina Hartshorn, [place] 05/12/2014, 3:25 PM

## 2014-05-12 NOTE — Progress Notes (Signed)
She is tolerating extubation well without distress and without O2 needs  Filed Vitals:   05/12/14 0900 05/12/14 1000 05/12/14 1100 05/12/14 1200  BP: 146/52 133/41 180/71 137/45  Pulse: 85 70 81 87  Temp:    98.5 F (36.9 C)  TempSrc:    Oral  Resp: 24 19 22 5   Height:      Weight:      SpO2: 97% 96% 99% 97%   NAD Surgical wound clean Chest clear RRR NABS Abd soft, +BS No LE edema  I have reviewed all of today's lab results. Relevant abnormalities are discussed in the A/P section  No new CXR  IMPRESSION: Meningioma - s/p resection Seizures, controlled - Neuro following VDRF, resolved Hypertension, controlled DM 2 - poorly controlled but improved some.   PLAN: Post op mgmt per NS Mgmt of seizures per Neurology Cont current anti-hypertensive regimen SSI changed to ACHS NS to consider tapering systemic steroids further   PCCM will sign off. Please call if we can be of further assistance  Merton Border, MD ; [facility] [phone].  After 5:30 PM or weekends, call [phone]

## 2014-05-13 LAB — GLUCOSE, CAPILLARY
GLUCOSE-CAPILLARY: 240 mg/dL — AB (ref 70–99)
GLUCOSE-CAPILLARY: 218 mg/dL — AB (ref 70–99)
Glucose-Capillary: 186 mg/dL — ABNORMAL HIGH (ref 70–99)
Glucose-Capillary: 225 mg/dL — ABNORMAL HIGH (ref 70–99)

## 2014-05-13 LAB — BASIC METABOLIC PANEL
ANION GAP: 8 (ref 5–15)
BUN: 40 mg/dL — ABNORMAL HIGH (ref 6–23)
CALCIUM: 8.1 mg/dL — AB (ref 8.4–10.5)
CO2: 22 mmol/L (ref 19–32)
CREATININE: 1.08 mg/dL (ref 0.50–1.10)
Chloride: 103 mmol/L (ref 96–112)
GFR calc Af Amer: 58 mL/min — ABNORMAL LOW (ref >90)
GFR calc non Af Amer: 50 mL/min — ABNORMAL LOW (ref >90)
Glucose, Bld: 213 mg/dL — ABNORMAL HIGH (ref 70–99)
Potassium: 3.8 mmol/L (ref 3.5–5.1)
SODIUM: 133 mmol/L — AB (ref 135–145)

## 2014-05-13 LAB — AMYLASE: Amylase: 19 U/L (ref 0–105)

## 2014-05-13 LAB — LIPASE, BLOOD: LIPASE: 25 U/L (ref 11–59)

## 2014-05-13 MED ORDER — NITROGLYCERIN 0.4 MG SL SUBL: 0.4000 mg | SUBLINGUAL_TABLET | SUBLINGUAL | Status: DC | PRN

## 2014-05-13 MED ORDER — ACETAMINOPHEN-CODEINE #3 300-30 MG PO TABS
1.0000 | ORAL_TABLET | ORAL | Status: DC | PRN
  Administered 2014-05-22 – 2014-05-23 (×2): 1 via ORAL
  Filled 2014-05-13 (×2): qty 1

## 2014-05-13 MED ORDER — ADULT MULTIVITAMIN W/MINERALS CH
1.0000 | ORAL_TABLET | Freq: Every day | ORAL | Status: DC
  Administered 2014-05-13 – 2014-05-19 (×6): 1 via ORAL
  Filled 2014-05-13 (×6): qty 1

## 2014-05-13 MED ORDER — HYDROCODONE-ACETAMINOPHEN 5-325 MG PO TABS: 1.0000 | ORAL_TABLET | Freq: Four times a day (QID) | ORAL | Status: DC | PRN

## 2014-05-13 MED ORDER — LIRAGLUTIDE 18 MG/3ML ~~LOC~~ SOPN
1.8000 mg | PEN_INJECTOR | Freq: Every day | SUBCUTANEOUS | Status: DC
  Administered 2014-05-14 – 2014-05-21 (×6): 1.8 mg via SUBCUTANEOUS
  Filled 2014-05-13 (×2): qty 0.3

## 2014-05-13 MED ORDER — PROCHLORPERAZINE 25 MG RE SUPP
25.0000 mg | Freq: Two times a day (BID) | RECTAL | Status: DC | PRN
  Administered 2014-05-13: 25 mg via RECTAL
  Filled 2014-05-13 (×3): qty 1

## 2014-05-13 MED ORDER — GLUCERNA SHAKE PO LIQD
237.0000 mL | Freq: Three times a day (TID) | ORAL | Status: DC
  Administered 2014-05-13: 237 mL via ORAL

## 2014-05-13 MED ORDER — FUROSEMIDE 40 MG PO TABS
40.0000 mg | ORAL_TABLET | Freq: Two times a day (BID) | ORAL | Status: DC
  Administered 2014-05-14 – 2014-05-29 (×25): 40 mg via ORAL
  Filled 2014-05-13 (×25): qty 1

## 2014-05-13 MED ORDER — OXYCODONE HCL 5 MG PO TABS
5.0000 mg | ORAL_TABLET | ORAL | Status: DC | PRN
  Administered 2014-05-13 – 2014-05-24 (×2): 5 mg via ORAL
  Filled 2014-05-13 (×3): qty 1

## 2014-05-13 NOTE — Consult Note (Signed)
Physical Medicine and Rehabilitation Consult Reason for Consult: Right frontal meningioma Referring Physician: Dr. Christella Noa   HPI: Morgan [name] is a 74 y.o. right handed female with history of hypertension, left breast cancer in remission 5 years, renal cancer with nephrectomy, diabetes mellitus with peripheral neuropathy and COPD. Patient lives with her husband and he uses a walker. She was independent prior to admission. Admitted 05/03/2014 with altered mental status, seizure and left-sided weakness with difficulty in speech and gaze to the left. CT MRI and imaging showed evidence of enlarging poorly characterized mass at the high right frontal parietal region measuring 5.4 x 3.3 cm with underlying vasogenic edema suspect meningioma. EEG shows with slowing of cerebral activity diffusely more pronounced involving right hemisphere in addition to an epileptiform disturbance involving the right hemisphere. Patient was loaded with Vimpat as well as Keppra. Neurosurgery consulted underwent right frontal stereotactic craniotomy for tumor resection 05/08/2014 per Dr. Cyndy Freeze. Patient did require intubation for a short time followed by critical care medicine. Decadron protocol as directed. Patient is tolerating a regular consistency diet. Physical therapy evaluation completed 05/12/2014 with recommendations of physical medicine rehabilitation consult.  Pt has been nauseated over the last 24-48 h per daughter who is at bedside   Review of Systems  Cardiovascular: Positive for leg swelling.  Gastrointestinal: Positive for constipation.  Neurological:       Vertigo  Psychiatric/Behavioral: Positive for depression.  All other systems reviewed and are negative.  Past Medical History  Diagnosis Date  . Diabetes mellitus   . Obesity   . Diabetes type 2, uncontrolled   . Hypoglycemia associated with diabetes   . Edema of both legs   . Hypertension   . Combined hyperlipidemia   . Acquired  autoimmune hypothyroidism   . Thyroiditis, autoimmune   . Abnormal liver function tests   . Sleep apnea, obstructive   . History of gastroesophageal reflux (GERD)   . Depression   . DM neuropathy with neurologic complication   . COPD with acute exacerbation   . Goiter   . Fatigue   . Vertigo   . Type II diabetes mellitus with peripheral angiopathy   . Gout   . Pallor    Past Surgical History  Procedure Laterality Date  . Laparoscopic gastric banding    . Back surgery    . Craniotomy Right 05/08/2014    Procedure: CRANIOTOMY FOR MENINGIOMA;  Surgeon: Ashok Pall, MD;  Location: [facility];  Service: Neurosurgery;  Laterality: Right;  Right Craniotomy for meningioma   Family History  Problem Relation Age of Onset  . Diabetes Father   . Diabetes Sister   . Diabetes Brother    Social History:  reports that she has never smoked. She does not have any smokeless tobacco history on file. She reports that she does not drink alcohol or use illicit drugs. Allergies:  Allergies  Allergen Reactions  . Latex Rash  . Sulfa Antibiotics Other (See Comments)    Unknown allergic reaction  . Penicillins Rash   Medications Prior to Admission  Medication Sig Dispense Refill  . acetaminophen (TYLENOL) 500 MG tablet Take 1,000 mg by mouth every 6 (six) hours as needed (pain).    Marland Kitchen albuterol (PROVENTIL,VENTOLIN) 90 MCG/ACT inhaler Inhale 2 puffs into the lungs 4 (four) times daily as needed for wheezing or shortness of breath.     . anastrozole (ARIMIDEX) 1 MG tablet Take 1 mg by mouth daily.      Marland Kitchen  aspirin 81 MG chewable tablet Chew 81 mg by mouth daily.      . Calcium Carbonate-Vitamin D (CALTRATE 600+D PO) Take 1 tablet by mouth 2 (two) times daily.     . cloNIDine (CATAPRES) 0.3 MG tablet Take 0.3 mg by mouth 2 (two) times daily.      . colchicine (COLCRYS) 0.6 MG tablet Take 0.6 mg by mouth 2 (two) times daily as needed (gout).     Marland Kitchen diclofenac sodium (VOLTAREN) 1 % GEL Apply 1  application topically 4 (four) times daily.    . DULoxetine (CYMBALTA) 60 MG capsule Take 60 mg by mouth 2 (two) times daily.     . febuxostat (ULORIC) 40 MG tablet Take 40 mg by mouth daily.     . felodipine (PLENDIL) 2.5 MG 24 hr tablet Take 2.5 mg by mouth daily.      . fexofenadine (ALLEGRA) 180 MG tablet Take 180 mg by mouth daily as needed for allergies or rhinitis.    . Fluticasone-Salmeterol (ADVAIR) 250-50 MCG/DOSE AEPB Inhale 1 puff into the lungs every 12 (twelve) hours.      . furosemide (LASIX) 40 MG tablet Take 40 mg by mouth 2 (two) times daily.      Marland Kitchen gabapentin (NEURONTIN) 100 MG capsule Take 100 mg by mouth daily.     . Glucosamine HCl (GLUCOSAMINE PO) Take 1 tablet by mouth daily.    Marland Kitchen HYDROcodone-acetaminophen (NORCO/VICODIN) 5-325 MG per tablet Take 1 tablet by mouth every 6 (six) hours as needed for moderate pain.    Marland Kitchen ibuprofen (ADVIL,MOTRIN) 200 MG tablet Take 200 mg by mouth every 6 (six) hours as needed (pain).    . insulin regular human CONCENTRATED (HUMULIN R) 500 UNIT/ML SOLN injection Inject 4-18 Units into the skin 3 (three) times daily with meals. CBG less than 70, treat the low blood sugar and recheck in 15 min. 70-90 4-6 units, 91-130 6-10 units, 131-150 7-11 units, 151-200 8-12 units, 201-250 9-13 units, 251-300 10-14 units, 301-350 11-15 units, 351-400 12-16 units, 401-450 13-17 units >450 14-18 units    . levothyroxine (SYNTHROID, LEVOTHROID) 200 MCG tablet Take 200-300 mcg by mouth. Brand Name Synthroid Only. Take 1 1/2 tablets (300 mg) on Sundays, take 1 tablet (200 mg) Monday thru Saturday    . Liraglutide 18 MG/3ML SOPN Inject 1.8 mg into the skin daily. Victoza    . lisinopril (PRINIVIL,ZESTRIL) 40 MG tablet Take 40 mg by mouth daily.   3  . LORazepam (ATIVAN) 0.5 MG tablet Take 0.5 mg by mouth at bedtime.     . metoprolol succinate (TOPROL-XL) 100 MG 24 hr tablet Take 100 mg by mouth daily. Take with or immediately following a meal.    . Multiple Vitamin  (MULTIVITAMIN WITH MINERALS) TABS tablet Take 1 tablet by mouth daily. Centrum Chewable    . Multiple Vitamin (MULTIVITAMIN WITH MINERALS) TABS tablet Take 1 tablet by mouth daily. One A Day with Iron    . nitroGLYCERIN (NITROSTAT) 0.4 MG SL tablet Place 0.4 mg under the tongue every 5 (five) minutes as needed for chest pain.     Marland Kitchen omeprazole (PRILOSEC OTC) 20 MG tablet Take 20 mg by mouth 2 (two) times daily.    Marland Kitchen senna (SENOKOT) 8.6 MG TABS tablet Take 1 tablet by mouth daily as needed for mild constipation.    . simvastatin (ZOCOR) 40 MG tablet Take 40 mg by mouth at bedtime.      . traMADol (ULTRAM) 50 MG tablet Take  50 mg by mouth 3 (three) times daily as needed (pain).    . vitamin B-12 (CYANOCOBALAMIN) 1000 MCG tablet Take 1,000 mcg by mouth daily.    Marland Kitchen ACCU-CHEK SMARTVIEW test strip USE TO TEST BLOOD SUGAR 6 TO 8 TIMES A DAY 200 each 3    Home: Home Living Family/patient expects to be discharged to:: Inpatient rehab  Functional History: Prior Function Level of Independence: Independent Functional Status:  Mobility: Bed Mobility Overal bed mobility: Needs Assistance, +2 for physical assistance Bed Mobility: Supine to Sit, Sit to Supine Supine to sit: Max assist, +2 for physical assistance Sit to supine: Max assist, +2 for physical assistance General bed mobility comments: pt assists with mobility, though generally weak and debilitated.          ADL:    Cognition: Cognition Overall Cognitive Status: Difficult to assess Orientation Level: Oriented X4 Cognition Arousal/Alertness: Lethargic Behavior During Therapy: Flat affect Overall Cognitive Status: Difficult to assess Difficult to assess due to: Level of arousal  Blood pressure 147/60, pulse 66, temperature 97.6 F (36.4 C), temperature source Axillary, resp. rate 16, height 5' 4"  (1.626 m), weight 108.1 kg (238 lb 5.1 oz), SpO2 98 %. Physical Exam  Vitals reviewed. Constitutional: She appears well-developed.    HENT:  Craniotomy site clean and dry  Eyes:  Pupils round and reactive to light without nystagmus  Neck: Normal range of motion. Neck supple. No thyromegaly present.  Cardiovascular: Normal rate and regular rhythm.   Respiratory: Effort normal and breath sounds normal. No respiratory distress.  GI: Soft. Bowel sounds are normal. She exhibits no distension.  Neurological: She is alert.  Mood is flat but appropriate. She is able to provide her name and age and date of birth. Follow simple commands  Motor 4/5 In BUE and BLE Sensation reduced in LLE to LT  Results for orders placed or performed during the hospital encounter of 05/03/14 (from the past 24 hour(s))  Glucose, capillary     Status: Abnormal   Collection Time: 05/12/14  7:38 AM  Result Value Ref Range   Glucose-Capillary 260 (H) 70 - 99 mg/dL  Glucose, capillary     Status: Abnormal   Collection Time: 05/12/14 12:03 PM  Result Value Ref Range   Glucose-Capillary 203 (H) 70 - 99 mg/dL  Glucose, capillary     Status: Abnormal   Collection Time: 05/12/14  4:22 PM  Result Value Ref Range   Glucose-Capillary 170 (H) 70 - 99 mg/dL  Glucose, capillary     Status: Abnormal   Collection Time: 05/12/14  7:25 PM  Result Value Ref Range   Glucose-Capillary 201 (H) 70 - 99 mg/dL  Glucose, capillary     Status: Abnormal   Collection Time: 05/12/14 10:08 PM  Result Value Ref Range   Glucose-Capillary 258 (H) 70 - 99 mg/dL   Comment 1 Documented in Chart    Comment 2 Notify RN    No results found.  Assessment/Plan: Diagnosis: Right frontal mengioma s/p resection 1. Does the need for close, 24 hr/day medical supervision in concert with the patient's rehab needs make it unreasonable for this patient to be served in a less intensive setting? Yes 2. Co-Morbidities requiring supervision/potential complications: DM, Urinary retention, DM, PN 3. Due to bladder management, bowel management, safety, skin/wound care, disease management,  medication administration, pain management and patient education, does the patient require 24 hr/day rehab nursing? Yes 4. Does the patient require coordinated care of a physician, rehab nurse, PT (1-2  hrs/day, 5 days/week), OT (1-2 hrs/day, 5 days/week) and SLP (.5-1 hrs/day, 5 days/week) to address physical and functional deficits in the context of the above medical diagnosis(es)? Yes Addressing deficits in the following areas: balance, endurance, locomotion, strength, transferring, bowel/bladder control, bathing, dressing, feeding, grooming, toileting, cognition, speech, language and swallowing 5. Can the patient actively participate in an intensive therapy program of at least 3 hrs of therapy per day at least 5 days per week? Yes 6. The potential for patient to make measurable gains while on inpatient rehab is good 7. Anticipated functional outcomes upon discharge from inpatient rehab are min assist  with PT, min assist with OT, min assist with SLP. 8. Estimated rehab length of stay to reach the above functional goals is: 14-18 d 9. Does the patient have adequate social supports and living environment to accommodate these discharge functional goals? Yes 10. Anticipated D/C setting: Home vs SNF  11. Anticipated post D/C treatments: SNF vs HH therapies 12. Overall Rehab/Functional Prognosis: good  RECOMMENDATIONS: This patient's condition is appropriate for continued rehabilitative care in the following setting: CIR Patient has agreed to participate in recommended program. N/A Note that insurance prior authorization may be required for reimbursement for recommended care.  Comment: Nausea and activity need to improve prior to CIR, may be a reduce burden of care admission, husband still recovering from [place]    05/13/2014

## 2014-05-13 NOTE — Progress Notes (Signed)
NUTRITION FOLLOW UP  Intervention:   Provide Glucerna Shakes TID after meals, each supplement provides 220 kcal and 10 grams of protein Provide Multivitamin with minerals daily Encourage PO intake as tolerated   Nutrition Dx:   Inadequate oral intake related to inability to eat as evidenced by NPO status; discontinued  New Nutrition Dx:   Inadequate oral intake related to nausea and vomiting as evidenced by pt's report and 3 lb weight loss.  Goal:   Enteral nutrition to provide 60-70% of estimated calorie needs (22-25 kcals/kg ideal body weight) and 100% of estimated protein needs, based on ASPEN guidelines for hypocaloric, high protein feeding in critically ill obese individuals; discontinued  New Goal: Pt to meet >/= 90% of their estimated nutrition needs   Monitor:   PO intake, supplement acceptance, weight trend, labs  Assessment:   Pt admitted 1/24 with AMS, found to have seizures in the setting of meningioma. S/p resection 1/29 and remained intubated.   Pt received tube feedings 1/31 thru 2/1. She was extubated 2/1 and diet was advanced to Carb Modified yesterday. Pt reports not eating due to nausea. Per family at bedside pt ate a few bites of breakfast but, vomited afterward. Pt has lost 3 lbs in the past 5 days.  Encouraged PO intake as tolerated. RD to order nutritional supplements until PO intake improves. Pt requested strawberry Ensure; however blood glucose is elevated. Per pt's daughter, pt is being weaned off of steroids.   Labs: elevated glucose, elevated BUN, low calcium  Height: Ht Readings from Last 1 Encounters:  05/08/14 5\' 4"  (1.626 m)    Weight Status:   Wt Readings from Last 1 Encounters:  05/12/14 238 lb 5.1 oz (108.1 kg)    Re-estimated needs:  Kcal: 1700-1950 Protein: 95-110 grams Fluid: 2.8 L/day  Skin: closed incision on head; +1 facial edema, +1 generalized edema  Diet Order: Diet Carb Modified   Intake/Output Summary (Last 24 hours) at  05/13/14 1637 Last data filed at 05/13/14 1500  Gross per 24 hour  Intake    325 ml  Output      0 ml  Net    325 ml    Last BM: 2/1   Labs:   Recent Labs Lab 05/09/14 1800 05/10/14 0452 05/11/14 0445  NA 135 135 138  K 4.2 4.1 4.0  CL 108 107 111  CO2 24 21 21   BUN 38* 38* 53*  CREATININE 1.14* 1.09 1.16*  CALCIUM 7.9* 7.9* 8.2*  GLUCOSE 273* 314* 405*    CBG (last 3)   Recent Labs  05/12/14 1925 05/12/14 2208 05/13/14 0649  GLUCAP 201* 258* 240*    Scheduled Meds: . anastrozole  1 mg Oral Daily  . cloNIDine  0.3 mg Oral BID  . dexamethasone  2 mg Intravenous Q12H  . insulin aspart  0-20 Units Subcutaneous TID WC  . insulin aspart  0-5 Units Subcutaneous QHS  . insulin aspart  4 Units Subcutaneous TID WC  . insulin glargine  20 Units Subcutaneous Daily  . lacosamide (VIMPAT) IV  200 mg Intravenous Q12H  . levETIRAcetam  500 mg Oral TID  . levothyroxine  200-300 mcg Oral QAC breakfast  . mometasone-formoterol  2 puff Inhalation BID  . pantoprazole  40 mg Oral Q1200    Continuous Infusions: . sodium chloride 10 mL/hr at 05/12/14 1800    [place], LDN Inpatient Clinical Dietitian Pager: [phone] After Hours Pager: [phone]

## 2014-05-13 NOTE — Progress Notes (Signed)
Inpatient Diabetes Program Recommendations  AACE/ADA: New Consensus Statement on Inpatient Glycemic Control (2013)  Target Ranges:  Prepandial:   less than 140 mg/dL      Peak postprandial:   less than 180 mg/dL (1-2 hours)      Critically ill patients:  140 - 180 mg/dL   Results for Morgan [name], Morgan [name] (MRN [id number]) as of 05/13/2014 09:49  Ref. Range 05/12/2014 07:38 05/12/2014 12:03 05/12/2014 16:22 05/12/2014 19:25 05/12/2014 22:08  Glucose-Capillary Latest Range: 70-99 mg/dL 260 (H) 203 (H) 170 (H) 201 (H) 258 (H)    Results for Morgan [name], Morgan [name] (MRN [id number]) as of 05/13/2014 09:49  Ref. Range 05/13/2014 06:49  Glucose-Capillary Latest Range: 70-99 mg/dL 240 (H)    Outpatient Diabetes medications: U500 insulin   Current Orders: Lantus 20 units daily   Novolog Resistant SSI tid ac + HS      Novolog 4 units tidwc   **Note patient currently receiving Decadron 2 mg BID. Underwent Craniotomy/Tumor resection on 01/29.  **Patient now on solid PO diet.   MD- Note fasting glucose levels still elevated.    Please consider increasing Lantus to 30 units daily    Will follow Wyn Quaker RN, MSN, CDE Diabetes Coordinator Inpatient Diabetes Program Team Pager: [phone] (8a-10p)
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
## 2014-05-13 NOTE — Progress Notes (Signed)
Patient ID: Morgan [name], female   DOB: January 23, 1941, 74 y.o.   MRN: [id number] BP 149/50 mmHg  Pulse 74  Temp(Src) 98.1 F (36.7 C) (Axillary)  Resp 20  Ht 5' 4"  (1.626 m)  Wt 108.1 kg (238 lb 5.1 oz)  BMI 40.89 kg/m2  SpO2 99% Alert and oriented x 4,speech is clear and fluent Moving all extremities well Has had multiple episodes of emesis today, despite zofran and compazine Not passing gas, nor has she had a substantial bowel movement. Continue laxatives Will restart the lasix Continue IV fluids xrays tomorrow along with lipase and amylase.

## 2014-05-13 NOTE — Evaluation (Signed)
Occupational Therapy Evaluation Patient Details Name: Morgan [name] MRN: [id number] DOB: [birth year] Today's Date: 05/13/2014    History of Present Illness pt presents with Craniotomy post Meningioma Resection, Seizures, and VDRF.     Clinical Impression   Patient independent PTA. Patient currently requires up to total assist +2. Patient will benefit from acute OT to increase overall independence in the areas of ADLs, functional mobility, positioning, therapeutic exercise, and overall safety in order to safely discharge to CIR for comprehensive rehabilitation.     Follow Up Recommendations  CIR;Supervision/Assistance - 24 hour    Equipment Recommendations   (TBD next venue)    Recommendations for Other Services Rehab consult     Precautions / Restrictions Precautions Precautions: Fall Restrictions Weight Bearing Restrictions: No     Mobility Bed Mobility  Defer to PT evaluation     Balance  Defer to PT evaluation      ADL Overall ADL's : Needs assistance/impaired     Grooming: Bed level;Moderate assistance   Upper Body Bathing: Bed level;Moderate assistance   Lower Body Bathing: Total assistance;Bed level   Upper Body Dressing : Moderate assistance;Bed level   Lower Body Dressing: Total assistance;Bed level     Functional mobility during ADLs: +2 for physical assistance General ADL Comments: Attempted to sit EOB, patient unable with 1 person at this time. Patient with increased lethargy and increased fatigue during session. Right lateral lean noted during attempt to sit EOB. Patient's eyes shut during most of session and patient with complaints of nausea. Discussed importance of positioning with patient and daughter. Educated both on use of pillows to elevated BUEs to decrease swelling. Administerred stress ball for patient to squeeze for strength and decrease swelling as well.      Vision Additional Comments: Unable to assess at this time, please test in  functional context setting when patient more alert           Pertinent Vitals/Pain Pain Assessment: Faces Faces Pain Scale: Hurts little more Pain Location: nauseated and complaints of a sore throat Pain Intervention(s): Monitored during session;Patient requesting pain meds-RN notified    Hand Dominance Right   Extremity/Trunk Assessment Upper Extremity Assessment Upper Extremity Assessment: RUE deficits/detail;LUE deficits/detail RUE Deficits / Details: patient reports shoulder surgery "a couple years ago", limited ROM and decreased strength throughout shoulder LUE Deficits / Details: patient reports rotator cuff surgery in June 2015, limited ROM and decreased strength throughout shoulder    Lower Extremity Assessment Lower Extremity Assessment: Defer to PT evaluation   Cervical / Trunk Assessment Cervical / Trunk Assessment: Normal   Communication Communication Communication: No difficulties   Cognition Arousal/Alertness: Lethargic Behavior During Therapy: Flat affect Overall Cognitive Status: Impaired/Different from baseline Area of Impairment: Attention;Following commands;Safety/judgement;Awareness;Problem solving;Memory   Current Attention Level: Sustained Memory: Decreased short-term memory Following Commands: Follows one step commands consistently Safety/Judgement: Decreased awareness of safety Awareness: Emergent Problem Solving: Slow processing;Difficulty sequencing;Requires verbal cues                Home Living Family/patient expects to be discharged to:: Inpatient rehab    Prior Functioning/Environment Level of Independence: Independent     OT Diagnosis: Generalized weakness;Cognitive deficits;Acute pain   OT Problem List: Decreased strength;Decreased range of motion;Decreased activity tolerance;Impaired balance (sitting and/or standing);Decreased coordination;Decreased cognition;Decreased safety awareness;Decreased knowledge of use of DME or  AE;Decreased knowledge of precautions;Impaired UE functional use;Pain   OT Treatment/Interventions: Self-care/ADL training;Therapeutic exercise;Neuromuscular education;Energy conservation;Balance training;DME and/or AE instruction;Patient/family education;Visual/perceptual remediation/compensation;Cognitive remediation/compensation;Therapeutic activities  OT Goals(Current goals can be found in the care plan section) Acute Rehab OT Goals Patient Stated Goal: get stronger  OT Goal Formulation: With patient/family Time For Goal Achievement: 05/27/14 Potential to Achieve Goals: Good ADL Goals Pt Will Perform Eating: with modified independence;sitting (AE prn) Pt Will Perform Grooming: with modified independence;sitting;with adaptive equipment (AE prn) Pt Will Perform Upper Body Bathing: with set-up;sitting Pt Will Perform Lower Body Bathing: with mod assist;sit to/from stand;with adaptive equipment Pt Will Perform Upper Body Dressing: with set-up;sitting Pt Will Perform Lower Body Dressing: with mod assist;sit to/from stand;with adaptive equipment Additional ADL Goal #1: Patient will perform bed mobility of rolling left<>right and sit<>supine with min assist in preparation for ADL Additional ADL Goal #2: Family will be independent with positioning of BUEs to decrease swelling  OT Frequency: Min 3X/week   Barriers to D/C: None known at this time, plan is for patient to discharge > CIR          End of Session Nurse Communication: Other (comment) (positioning)  Activity Tolerance: Patient limited by lethargy;Patient limited by fatigue;Patient limited by pain Patient left: in bed;with call bell/phone within reach;with nursing/sitter in room;with family/visitor present;with bed alarm set   Time: [id number] OT Time Calculation (min): 26 min Charges:  OT General Charges $OT Visit: 1 Procedure OT Evaluation $Initial OT Evaluation Tier I: 1 Procedure OT Treatments $Therapeutic Activity: 8-22  mins  Sueann Brownley , MS, OTR/L, CLT Pager: [phone]  05/13/2014, 11:45 AM

## 2014-05-14 ENCOUNTER — Inpatient Hospital Stay (HOSPITAL_COMMUNITY): Payer: Medicare Other

## 2014-05-14 ENCOUNTER — Encounter (HOSPITAL_COMMUNITY): Payer: Self-pay | Admitting: Neurosurgery

## 2014-05-14 LAB — GLUCOSE, CAPILLARY
GLUCOSE-CAPILLARY: 231 mg/dL — AB (ref 70–99)
GLUCOSE-CAPILLARY: 255 mg/dL — AB (ref 70–99)
GLUCOSE-CAPILLARY: 279 mg/dL — AB (ref 70–99)
Glucose-Capillary: 184 mg/dL — ABNORMAL HIGH (ref 70–99)

## 2014-05-14 MED ORDER — PHENOL 1.4 % MT LIQD
1.0000 | OROMUCOSAL | Status: DC | PRN
  Administered 2014-05-14 – 2014-05-15 (×2): 1 via OROMUCOSAL
  Filled 2014-05-14: qty 177

## 2014-05-14 MED ORDER — CLONIDINE HCL 0.3 MG/24HR TD PTWK
0.3000 mg | MEDICATED_PATCH | TRANSDERMAL | Status: DC
  Administered 2014-05-14 – 2014-05-28 (×3): 0.3 mg via TRANSDERMAL
  Filled 2014-05-14 (×3): qty 1

## 2014-05-14 MED ORDER — LEVETIRACETAM IN NACL 500 MG/100ML IV SOLN
500.0000 mg | Freq: Three times a day (TID) | INTRAVENOUS | Status: DC
  Administered 2014-05-14 – 2014-05-29 (×44): 500 mg via INTRAVENOUS
  Filled 2014-05-14 (×47): qty 100

## 2014-05-14 MED ORDER — HYDROMORPHONE HCL 1 MG/ML IJ SOLN
1.0000 mg | INTRAMUSCULAR | Status: DC | PRN
  Administered 2014-05-14 – 2014-05-27 (×19): 1 mg via INTRAMUSCULAR
  Filled 2014-05-14 (×20): qty 1

## 2014-05-14 NOTE — Progress Notes (Signed)
Occupational Therapy Treatment Patient Details Name: Morgan [name] MRN: [id number] DOB: 1941/02/24 Today's Date: 05/14/2014    History of present illness pt presents with Craniotomy post Meningioma Resection, Seizures, and VDRF.     OT comments  Patient progressing towards goals, continue plan of care for now. Patient with increased lethargy during OT/PT co-treatment. Patient with eyes shut most of session and inconsistently followed one-step commands. Patient's son present and provided support and encouragement to patient.    Follow Up Recommendations  CIR;Supervision/Assistance - 24 hour    Equipment Recommendations   (TBD next venue)    Recommendations for Other Services Rehab consult    Precautions / Restrictions Precautions Precautions: Fall Restrictions Weight Bearing Restrictions: No       Mobility Bed Mobility Overal bed mobility: +2 for physical assistance Bed Mobility: Rolling;Sidelying to Sit Rolling: Min guard Sidelying to sit: Mod assist       General bed mobility comments: cues required for patient to stay on task   Transfers Overall transfer level: Needs assistance Equipment used:  (unable to use RW secondary to safety) Transfers: Squat Pivot Transfers     Squat pivot transfers: Max assist;+2 physical assistance     General transfer comment: Unable to safetly perform sit<>stand from bed level. Patient's son assisted with transfer from EOB>recliner (+3 at this time)     Balance Overall balance assessment: Needs assistance Sitting-balance support: Feet supported;No upper extremity supported Sitting balance-Leahy Scale: Fair    ADL Overall ADL's : Needs assistance/impaired     Grooming: Minimal assistance;Oral care;Sitting;Cueing for sequencing;Cueing for safety Grooming Details (indicate cue type and reason): unsupported EOB Functional mobility during ADLs: +2 for physical assistance;Maximal assistance;Cueing for safety;Cueing for  sequencing General ADL Comments: Patient with increased lethargy during session. Patient sat EOB for grooming task of brushing teeth. Patient with decreased awareness and initation to follow through with completing the task, requiring max verbal cues.       Vision  To be further tested. Patient with lethargy and barely able to keep eyes open during session.          Cognition   Behavior During Therapy: Flat affect Overall Cognitive Status: Impaired/Different from baseline Area of Impairment: Attention;Following commands;Safety/judgement;Awareness;Problem solving;Memory   Current Attention Level: Sustained Memory: Decreased short-term memory  Following Commands: Follows one step commands inconsistently Safety/Judgement: Decreased awareness of safety;Decreased awareness of deficits Awareness: Emergent Problem Solving: Slow processing;Difficulty sequencing;Requires verbal cues;Decreased initiation                   Pertinent Vitals/ Pain       Pain Assessment: No/denies pain Pain Location: pt continues to complaints of nausea during some movements   Home Living Family/patient expects to be discharged to:: Inpatient rehab       Frequency Min 3X/week     Progress Toward Goals  OT Goals(current goals can now be found in the care plan section)  Progress towards OT goals: Progressing toward goals     Plan Discharge plan remains appropriate    Co-evaluation    PT/OT/SLP Co-Evaluation/Treatment: Yes Reason for Co-Treatment: Complexity of the patient's impairments (multi-system involvement);For patient/therapist safety   OT goals addressed during session: ADL's and self-care      End of Session Equipment Utilized During Treatment: Gait belt   Activity Tolerance Patient limited by lethargy   Patient Left in chair;with call bell/phone within reach;with family/visitor present   Nurse Communication Mobility status;Need for lift equipment  Time: 1350-1425 OT  Time Calculation (min): 35 min  Charges: OT General Charges $OT Visit: 1 Procedure OT Treatments $Self Care/Home Management : 8-22 mins  Jonathin Heinicke , MS, OTR/L, CLT Pager: [phone]   05/14/2014, 2:50 PM

## 2014-05-14 NOTE — Progress Notes (Addendum)
Inpatient Diabetes Program Recommendations  AACE/ADA: New Consensus Statement on Inpatient Glycemic Control (2013)  Target Ranges:  Prepandial:   less than 140 mg/dL      Peak postprandial:   less than 180 mg/dL (1-2 hours)      Critically ill patients:  140 - 180 mg/dL   Results for SHEINDEL, MANS (MRN [id number]) as of 05/14/2014 11:29  Ref. Range 05/13/2014 06:49 05/13/2014 11:18 05/13/2014 16:33 05/13/2014 21:14 05/14/2014 06:33  Glucose-Capillary Latest Range: 70-99 mg/dL 240 (H) 186 (H) 225 (H) 218 (H) 184 (H)    Reason for Assessment- ongoing elevated CBG  Outpatient Diabetes medications: U500 insulin   Current Orders: Lantus 20 units daily  Novolog Resistant SSI tid ac + HS  Novolog 4 units tidwc                            Liraglutide 1.8mg  (ordered on 05/13/14- not given yet on 05/14/14)   **Note patient currently receiving Decadron 2 mg BID. Underwent Craniotomy/Tumor resection on 01/29.  Please consider increasing Lantus insulin to 30 units q day  Gentry Fitz, RN, [place], [place], CDE Diabetes Coordinator Inpatient Diabetes Program  [phone] (Team Pager) [phone] [facility]) 05/14/2014 11:31 AM

## 2014-05-14 NOTE — Progress Notes (Signed)
Patient ID: Morgan [name], female   DOB: 11/09/1940, 74 y.o.   MRN: [id number] BP 165/57 mmHg  Pulse 71  Temp(Src) 97.5 F (36.4 C) (Oral)  Resp 16  Ht 5' 4"  (1.626 m)  Wt 108.1 kg (238 lb 5.1 oz)  BMI 40.89 kg/m2  SpO2 98% Alert, oriented x 4, speech is clear and fluent Continued emesis, though she had been doing well all day until this evening. Will have ng placed  xrays in the AM.  All labs were good this morning.  Switching keppra to IV, clonidine to a patch again. Doing well neurologically

## 2014-05-14 NOTE — Progress Notes (Signed)
Physical Therapy Treatment Patient Details Name: Morgan [name] MRN: [id number] DOB: 1940/12/16 Today's Date: 05/14/2014    History of Present Illness pt presents with Craniotomy post Meningioma Resection, Seizures, and VDRF.      PT Comments    Pt. Up in chair with UEs elevated to help swelling and was squeezing stress ball. Very lethargic during session, multiple cues to keep her eyes open from therapists and son. Unable to stand her all the way up so deferred to squat pivot transfer. Notified nursing that she was up in chair and suggested use of lift equipment to get back in bed. Recommend CIR for ongoing therapy needs to gain strength and independence.  Follow Up Recommendations  CIR     Equipment Recommendations  None recommended by PT    Recommendations for Other Services Rehab consult     Precautions / Restrictions Precautions Precautions: Fall Restrictions Weight Bearing Restrictions: No    Mobility  Bed Mobility Overal bed mobility: +2 for physical assistance Bed Mobility: Rolling;Sidelying to Sit Rolling: Min guard Sidelying to sit: Mod assist       General bed mobility comments: cues required for patient to stay on task   Transfers Overall transfer level: Needs assistance Equipment used: None Transfers: Squat Pivot Transfers     Squat pivot transfers: Max assist;+2 physical assistance     General transfer comment: Unable to safetly perform sit<>stand from bed level. Patient's son assisted with transfer from EOB>recliner (+3 at this time)   Ambulation/Gait                 Stairs            Wheelchair Mobility    Modified Rankin (Stroke Patients Only)       Balance Overall balance assessment: Needs assistance Sitting-balance support: Feet supported;No upper extremity supported Sitting balance-Leahy Scale: Fair Sitting balance - Comments: tactile cues for sitting posture with trunk upright; bilateral support to hold on EOB,  fatigued and started to lean anteriorly on table when brushing teeth (OT)                            Cognition Arousal/Alertness: Lethargic Behavior During Therapy: Flat affect Overall Cognitive Status: Impaired/Different from baseline Area of Impairment: Attention;Following commands;Safety/judgement;Awareness;Problem solving;Memory   Current Attention Level: Sustained Memory: Decreased short-term memory Following Commands: Follows one step commands inconsistently Safety/Judgement: Decreased awareness of safety;Decreased awareness of deficits Awareness: Emergent Problem Solving: Slow processing;Difficulty sequencing;Requires verbal cues;Decreased initiation General Comments: preservating on tasks such as cleaning perineal area and teeth    Exercises      General Comments        Pertinent Vitals/Pain Pain Assessment: No/denies pain Pain Location: pt continues to complaints of nausea during some movements     Home Living Family/patient expects to be discharged to:: Inpatient rehab                    Prior Function            PT Goals (current goals can now be found in the care plan section) Progress towards PT goals: Progressing toward goals    Frequency  Min 3X/week    PT Plan Current plan remains appropriate    Co-evaluation PT/OT/SLP Co-Evaluation/Treatment: Yes Reason for Co-Treatment: Complexity of the patient's impairments (multi-system involvement);For patient/therapist safety   OT goals addressed during session: ADL's and self-care     End of Session Equipment Utilized During  Treatment: Gait belt Activity Tolerance: Patient limited by lethargy Patient left: in chair;with call bell/phone within reach;with family/visitor present     Time: 1350-1425 PT Time Calculation (min) (ACUTE ONLY): 35 min  Charges:                       G Codes:      Jodi Geralds, SPTA 05/14/2014, 3:22 PM

## 2014-05-14 NOTE — Progress Notes (Signed)
UR complete.  Briele Lagasse RN, MSN 

## 2014-05-15 ENCOUNTER — Inpatient Hospital Stay (HOSPITAL_COMMUNITY): Payer: Medicare Other

## 2014-05-15 LAB — GLUCOSE, CAPILLARY
Glucose-Capillary: 157 mg/dL — ABNORMAL HIGH (ref 70–99)
Glucose-Capillary: 95 mg/dL (ref 70–99)
Glucose-Capillary: 174 mg/dL — ABNORMAL HIGH (ref 70–99)
Glucose-Capillary: 260 mg/dL — ABNORMAL HIGH (ref 70–99)

## 2014-05-15 MED ORDER — LEVOTHYROXINE SODIUM 100 MCG PO TABS
200.0000 ug | ORAL_TABLET | ORAL | Status: DC
  Administered 2014-05-16 – 2014-05-29 (×10): 200 ug via ORAL
  Filled 2014-05-15: qty 4
  Filled 2014-05-15: qty 2
  Filled 2014-05-15: qty 4
  Filled 2014-05-15 (×7): qty 2

## 2014-05-15 MED ORDER — LEVOTHYROXINE SODIUM 100 MCG PO TABS
300.0000 ug | ORAL_TABLET | ORAL | Status: DC
  Administered 2014-05-17 – 2014-05-24 (×2): 300 ug via ORAL
  Filled 2014-05-15 (×2): qty 3

## 2014-05-15 MED ORDER — ENSURE COMPLETE PO LIQD: 237.0000 mL | Freq: Two times a day (BID) | ORAL | Status: DC

## 2014-05-15 MED ORDER — BOOST / RESOURCE BREEZE PO LIQD
1.0000 | Freq: Two times a day (BID) | ORAL | Status: DC
  Administered 2014-05-15 – 2014-05-16 (×3): 1 via ORAL

## 2014-05-15 NOTE — Progress Notes (Signed)
NUTRITION FOLLOW UP  Intervention:   Discontinue Glucerna and Ensure Complete supplements Provide Resource Breeze BID after meals Provide Multivitamin with minerals daily Encourage PO intake as tolerated    Nutrition Dx:   Inadequate oral intake related to nausea and vomiting as evidenced by pt's report and 3 lb weight loss; ongoing  New Goal: Pt to meet >/= 90% of their estimated nutrition needs; unmet  Monitor:   PO intake, supplement acceptance, weight trend, labs  Assessment:   Pt admitted 1/24 with AMS, found to have seizures in the setting of meningioma. S/p resection 1/29 and remained intubated.   Pt continues to have nausea with emesis. NGT placed for intermittent suction. Suction turned off this morning and pt was able to tolerate [place] ice. Pt asleep at time of visit with NGT to suction. Per nursing notes, pt refused Glucerna Shakes (doesn't like them) and Ensure Complete (too nauseated). Now that patient's blood sugars have come down, will provide Resource Breeze supplements. Per family pt tolerated some soft foods last night- yogurt, italian ice, and vegetable soup.  Encouraged small bites of food and sips of liquids as tolerated.   Labs: elevated glucose- trending down, low sodium, elevated BUN, low calcium  Height: Ht Readings from Last 1 Encounters:  05/08/14 5\' 4"  (1.626 m)    Weight Status:   Wt Readings from Last 1 Encounters:  05/12/14 238 lb 5.1 oz (108.1 kg)    Re-estimated needs:  Kcal: 1700-1950 Protein: 95-110 grams Fluid: 2.8 L/day  Skin: closed incision on head; +1 facial edema, +1 generalized edema  Diet Order: Diet Carb Modified   Intake/Output Summary (Last 24 hours) at 05/15/14 1459 Last data filed at 05/15/14 0900  Gross per 24 hour  Intake      0 ml  Output      0 ml  Net      0 ml    Last BM: 2/2   Labs:   Recent Labs Lab 05/10/14 0452 05/11/14 0445 05/13/14 2108  NA 135 138 133*  K 4.1 4.0 3.8  CL 107 111 103  CO2  21 21 22   BUN 38* 53* 40*  CREATININE 1.09 1.16* 1.08  CALCIUM 7.9* 8.2* 8.1*  GLUCOSE 314* 405* 213*    CBG (last 3)   Recent Labs  05/14/14 2241 05/15/14 0648 05/15/14 1145  GLUCAP 279* 157* 95    Scheduled Meds: . anastrozole  1 mg Oral Daily  . cloNIDine  0.3 mg Transdermal Weekly  . dexamethasone  2 mg Intravenous Q12H  . feeding supplement (ENSURE COMPLETE)  237 mL Oral BID PC  . furosemide  40 mg Oral BID  . insulin aspart  0-20 Units Subcutaneous TID WC  . insulin aspart  0-5 Units Subcutaneous QHS  . insulin aspart  4 Units Subcutaneous TID WC  . insulin glargine  20 Units Subcutaneous Daily  . lacosamide (VIMPAT) IV  200 mg Intravenous Q12H  . levETIRAcetam  500 mg Intravenous 3 times per day  . levothyroxine  200 mcg Oral Once per day on Mon Tue Wed Thu Fri Sat  . levothyroxine  200-300 mcg Oral QAC breakfast  . [START ON 05/17/2014] levothyroxine  300 mcg Oral Once every 7 days  . Liraglutide  1.8 mg Subcutaneous Daily  . mometasone-formoterol  2 puff Inhalation BID  . multivitamin with minerals  1 tablet Oral Daily  . pantoprazole  40 mg Oral Q1200    Continuous Infusions: . sodium chloride 10 mL/hr at 05/13/14  [place], LDN Inpatient Clinical Dietitian Pager: [phone] After Hours Pager: [phone]

## 2014-05-15 NOTE — Progress Notes (Signed)
Pt tolerated Resource Breeze supplement, with no nausea. Pt's NG tube is currently clamped for 30 minutes so supplement can digest. Will continue to monitor pt.

## 2014-05-15 NOTE — Progress Notes (Signed)
Occupational Therapy Treatment Patient Details Name: Morgan [name] MRN: [id number] DOB: 06-24-1940 Today's Date: 05/15/2014    History of present illness pt presents with Craniotomy post Meningioma Resection, Seizures, and VDRF.     OT comments  Patient progressing towards goals. Patient able to follow one-step commands more consistently today. Patient continues to be limited by fatigue/lethargy and nausea. Patient willing and motivated to work with therapist. Believe patient is a good candidate for CIR to decrease burden of care and increase her overall independence and quality of life. Patient's son and daughter commented that patient more alert and awake after sitting up for awhile yesterday.    Follow Up Recommendations  CIR;Supervision/Assistance - 24 hour    Equipment Recommendations   (TBD)    Recommendations for Other Services Rehab consult;Speech consult    Precautions / Restrictions Precautions Precautions: Fall Restrictions Weight Bearing Restrictions: No       Mobility Bed Mobility Overal bed mobility: Needs Assistance Bed Mobility: Rolling;Sidelying to Sit Rolling: Min guard Sidelying to sit: Mod assist       General bed mobility comments: cues required for patient to stay on task    Balance Overall balance assessment: Needs assistance Sitting-balance support: Feet supported;No upper extremity supported Sitting balance-Leahy Scale: Fair Sitting balance - Comments: Patient maintained sitting balance during bathing with supervision. Cues required for correct posture while seated EOB.                            ADL Overall ADL's : Needs assistance/impaired         Upper Body Bathing: Total assistance;Sitting Upper Body Bathing Details (indicate cue type and reason): sitting unsupported EOB, supervision to maintain sitting balance Lower Body Bathing: Total assistance;Bed level   Upper Body Dressing : Total assistance;Sitting Upper Body  Dressing Details (indicate cue type and reason): sitting unsuported EOB; supervision to maintain sitting balance                   General ADL Comments: Patient tolerated seated EOB for 20 minutes during bathing. Patient required mod verbal cues to follow commands and keep eyes open. Patient's daughter in there and stated that after sitting up yesterday "she looked better" and "her eyes were wide open". Continue to recommend CIR for extensive rehab to decrease burden of care.                 Cognition   Behavior During Therapy: Flat affect Overall Cognitive Status: Impaired/Different from baseline Area of Impairment: Attention;Following commands;Safety/judgement;Awareness;Problem solving;Memory   Current Attention Level: Sustained Memory: Decreased short-term memory  Following Commands: Follows one step commands inconsistently Safety/Judgement: Decreased awareness of safety;Decreased awareness of deficits Awareness: Emergent Problem Solving: Slow processing;Difficulty sequencing;Requires verbal cues;Decreased initiation                   Pertinent Vitals/ Pain       Pain Assessment: No/denies pain Pain Location: Complaints of nausea during some transitional movements   Home Living   Living Arrangements: Spouse/significant other;Other (Comment) (since husband's accident, children have been assisting pt an) Available Help at Discharge: Family;Available 24 hours/day Type of Home: House       Additional Comments: pt's spouse has been receiving HH with [place]  Lives With: Spouse        Frequency Min 3X/week     Progress Toward Goals  OT Goals(current goals can now be found in the care plan section)  Progress towards OT goals: Progressing toward goals  Acute Rehab OT Goals Patient Stated Goal: get stronger   Plan Discharge plan remains appropriate          Activity Tolerance Patient limited by fatigue   Patient Left in bed;with call bell/phone within  reach;with family/visitor present     Time: 1459-1543 OT Time Calculation (min): 44 min  Charges: OT General Charges $OT Visit: 1 Procedure OT Treatments $Self Care/Home Management : 38-52 mins  Morgan [name] , MS, OTR/L, CLT Pager: [phone]  05/15/2014, 3:54 PM

## 2014-05-15 NOTE — Progress Notes (Signed)
Met with pt and her daughter at bedside. Pt is alert with NGT. I await medical readiness to hopefully admit pt to inpt rehab pending bed availability and pt/family preference. We will follow up next week. [phone]

## 2014-05-15 NOTE — Progress Notes (Signed)
Pt requested [place] ice. Pt has NG tube to intermittent suction but is not NPO at this time. Pt given cherry [place] ice and tolerated it well, no nausea or vomiting. Intermittent suction turned off while pt eating. Intermittent suction just turned back on. Pt tolerating well. Will continue to monitor pt.

## 2014-05-15 NOTE — Progress Notes (Signed)
Patient ID: Morgan [name], female   DOB: Jul 19, 1940, 74 y.o.   MRN: [id number] BP 170/70 mmHg  Pulse 79  Temp(Src) 98.8 F (37.1 C) (Oral)  Resp 18  Ht 5' 4"  (1.626 m)  Wt 108.1 kg (238 lb 5.1 oz)  BMI 40.89 kg/m2  SpO2 93% Alert, oriented x 4, speech is clear, fluent Moving all extremities well. Wound is clean, dry no signs of infection Continues to be nauseated. No emesis since ng in place. Abdominal xrays are normal.  Continue ng for now. Only taking liquids.  No bowel movement. Will order enema.

## 2014-05-15 NOTE — PMR Pre-admission (Signed)
PMR Admission Coordinator Pre-Admission Assessment  Patient: Morgan [name] is an 74 y.o., female MRN: [id number] DOB: 1940-07-13 Height: 5\' 4"  (162.6 cm) Weight: 100.3 kg (221 lb 1.9 oz)              Insurance Information HMO:     PPO:      PCP:      IPA:      80/20: yes     OTHER: no HMO PRIMARY: Medicare a and b      Policy#: [id number]      Subscriber: pt Benefits:  Phone #: [email or web address] [email or web address]     Name:05/12/14 Eff. Date: 12/09/05     Deduct: $1288      Out of Pocket Max: none      Life Max: none CIR: 100%      SNF: 20 full days Outpatient: 80%     Co-Pay: 20% Home Health: 100%      Co-Pay: none DME: 80%     Co-Pay: 20% Providers: pt choice  SECONDARY: BCBS supplement      Policy#: [id number]      Subscriber: pt  Medicaid Application Date:       Case Manager:  Disability Application Date:       Case Worker:   Emergency Contact Information Contact Information    Name Relation Home Work Mobile   [place] Spouse [phone]  [phone]   [name],Morgan Daughter   [phone]   [name], Morgan   [phone]     Current Medical History  Patient Admitting Diagnosis: right frontal mengioma s/p resection  History of Present Illness: Morgan [name] is a 74 y.o. right handed female with history of hypertension, left breast cancer in remission 5 years, renal cancer with nephrectomy, diabetes mellitus with peripheral neuropathy and COPD. Patient lives with her husband and he uses a walker. She was independent prior to admission.   Admitted 05/03/2014 with altered mental status, seizure and left-sided weakness with difficulty in speech and gaze to the left. CT MRI and imaging showed evidence of enlarging poorly characterized mass at the high right frontal parietal region measuring 5.4 x 3.3 cm with underlying vasogenic edema suspect meningioma. EEG shows with slowing of cerebral activity diffusely more pronounced involving right hemisphere in addition to an epileptiform  disturbance involving the right hemisphere. Patient was loaded with Vimpat as well as Keppra. Neurosurgery consulted underwent right frontal stereotactic craniotomy for tumor resection 05/08/2014 per Dr. Cyndy Freeze. Patient did require intubation for a short time followed by critical care medicine. Decadron protocol as directed.   Pt has been nauseated postoperatively with constipation with NGT placement 05/14/14 pm and GI consulted 05/18/14. Reglan started as well as TNA and Miralax. EGD 2/12 negative. First BM since admission was 2/16 and another BM 2/17. TNA and NGT discontinued. Pt with dysphagia but states fodd textures make her nauseated at this time. reports cough with eating pta due to her hiatal hernia. GI signed off on 05/28/14.  Total: 5 NIH  Past Medical History  Past Medical History  Diagnosis Date  . Diabetes mellitus   . Obesity   . Diabetes type 2, uncontrolled   . Hypoglycemia associated with diabetes   . Edema of both legs   . Hypertension   . Combined hyperlipidemia   . Acquired autoimmune hypothyroidism   . Thyroiditis, autoimmune   . Abnormal liver function tests   . Sleep apnea, obstructive   . History of gastroesophageal reflux (GERD)   . Depression   . DM  neuropathy with neurologic complication   . COPD with acute exacerbation   . Goiter   . Fatigue   . Vertigo   . Type II diabetes mellitus with peripheral angiopathy   . Gout   . Pallor     Family History  family history includes Diabetes in her brother, father, and sister.  Prior Rehab/Hospitalizations: HH only  Current Medications   Current facility-administered medications:  .  0.9 %  sodium chloride infusion, , Intravenous, Continuous, Wilhelmina Mcardle, MD, Last Rate: 10 mL/hr at 05/29/14 0219 .  acetaminophen (TYLENOL) suppository 650 mg, 650 mg, Rectal, Q6H PRN, Roland Rack, MD .  acetaminophen (TYLENOL) tablet 650 mg, 650 mg, Oral, Q6H PRN, Cherene Altes, MD, 650 mg at 05/13/14 1132 .   acetaminophen-codeine (TYLENOL #3) 300-30 MG per tablet 1 tablet, 1 tablet, Oral, Q4H PRN, Ashok Pall, MD, 1 tablet at 05/23/14 1736 .  anastrozole (ARIMIDEX) tablet 1 mg, 1 mg, Oral, Daily, Cherene Altes, MD, 1 mg at 05/29/14 1032 .  antiseptic oral rinse (CPC / CETYLPYRIDINIUM CHLORIDE 0.05%) solution 7 mL, 7 mL, Mouth Rinse, BID, Ashok Pall, MD, 7 mL at 05/29/14 1000 .  artificial tears (LACRILUBE) ophthalmic ointment, , Both Eyes, Q4H PRN, Ashok Pall, MD, 1 application at [id number] .  bisacodyl (DULCOLAX) suppository 10 mg, 10 mg, Rectal, Daily PRN, Winfield Cunas., MD .  cloNIDine (CATAPRES - Dosed in mg/24 hr) patch 0.3 mg, 0.3 mg, Transdermal, Weekly, Ashok Pall, MD, 0.3 mg at 05/28/14 2230 .  colchicine tablet 0.6 mg, 0.6 mg, Oral, BID, Ashok Pall, MD, 0.6 mg at 05/29/14 1032 .  DULoxetine (CYMBALTA) DR capsule 60 mg, 60 mg, Oral, BID, Floyce Stakes, MD, 60 mg at 05/29/14 1032 .  furosemide (LASIX) tablet 40 mg, 40 mg, Oral, BID, Ashok Pall, MD, 40 mg at 05/29/14 0826 .  guaiFENesin (MUCINEX) 12 hr tablet 600 mg, 600 mg, Oral, BID, Floyce Stakes, MD, 600 mg at 05/29/14 1032 .  guaifenesin (ROBITUSSIN) 100 MG/5ML syrup 400 mg, 400 mg, Oral, Q6H PRN, Floyce Stakes, MD, 400 mg at 05/25/14 1202 .  heparin injection 5,000 Units, 5,000 Units, Subcutaneous, 3 times per day, Ashok Pall, MD, 5,000 Units at 05/29/14 0541 .  HYDROcodone-acetaminophen (NORCO/VICODIN) 5-325 MG per tablet 1 tablet, 1 tablet, Oral, Q6H PRN, Ashok Pall, MD .  insulin aspart (novoLOG) injection 0-20 Units, 0-20 Units, Subcutaneous, 6 times per day, Saundra Shelling, [facility], 11 Units at 05/29/14 1138 .  lacosamide (VIMPAT) 200 mg in sodium chloride 0.9 % 25 mL IVPB, 200 mg, Intravenous, Q12H, Roland Rack, MD, 200 mg at 05/29/14 0839 .  lactated ringers infusion, , Intravenous, Continuous, Newman Pies, MD, Last Rate: 75 mL/hr at 05/27/14 1959 .  levETIRAcetam (KEPPRA) IVPB 500 mg/100  mL premix, 500 mg, Intravenous, 3 times per day, Ashok Pall, MD, 500 mg at 05/29/14 0541 .  levothyroxine (SYNTHROID, LEVOTHROID) tablet 200 mcg, 200 mcg, Oral, Once per day on Mon Tue Wed Thu Fri Sat, Ashok Pall, MD, 200 mcg at 05/29/14 0841 .  levothyroxine (SYNTHROID, LEVOTHROID) tablet 300 mcg, 300 mcg, Oral, Once every 7 days, Ashok Pall, MD, 300 mcg at 05/24/14 0805 .  Liraglutide SOPN 1.8 mg, 1.8 mg, Subcutaneous, Daily, Ashok Pall, MD, 1.8 mg at 05/28/14 2028 .  metoCLOPramide (REGLAN) injection 10 mg, 10 mg, Intravenous, 4 times per day, Winfield Cunas., MD, 10 mg at 05/29/14 1136 .  mometasone-formoterol (DULERA) 100-5 MCG/ACT inhaler 2 puff,  2 puff, Inhalation, BID, Ashok Pall, MD, 2 puff at 05/29/14 0729 .  nitroGLYCERIN (NITROSTAT) SL tablet 0.4 mg, 0.4 mg, Sublingual, Q5 min PRN, Ashok Pall, MD .  ondansetron [facility]) injection 4 mg, 4 mg, Intravenous, Q6H PRN, Charlie Pitter, MD, 4 mg at 05/29/14 1033 .  oxyCODONE (Oxy IR/ROXICODONE) immediate release tablet 5-10 mg, 5-10 mg, Oral, Q4H PRN, Ashok Pall, MD, 5 mg at 05/24/14 2219 .  pantoprazole (PROTONIX) EC tablet 40 mg, 40 mg, Oral, BID, Ashok Pall, MD .  phenol (CHLORASEPTIC) mouth spray 1 spray, 1 spray, Mouth/Throat, PRN, Faythe Ghee, MD, 1 spray at 05/15/14 0730 .  polyethylene glycol (MIRALAX / GLYCOLAX) packet 17 g, 17 g, Oral, BID, Wonda Horner, MD, 17 g at 05/29/14 1031 .  prochlorperazine (COMPAZINE) suppository 25 mg, 25 mg, Rectal, Q12H PRN, Ashok Pall, MD, 25 mg at 05/13/14 1543 .  promethazine (PHENERGAN) injection 12.5 mg, 12.5 mg, Intravenous, Q6H PRN, Erline Levine, MD, 12.5 mg at 05/17/14 2228 .  RESOURCE THICKENUP CLEAR, , Oral, PRN, Ashok Pall, MD .  scopolamine (TRANSDERM-SCOP) 1 MG/3DAYS 1.5 mg, 1 patch, Transdermal, Q72H, Ashok Pall, MD, 1.5 mg at 05/29/14 1218 .  sodium chloride 0.9 % injection 10-40 mL, 10-40 mL, Intracatheter, PRN, Ashok Pall, MD, 10 mL at 05/26/14  0548  Patients Current Diet: DIET DYS 3  With nectar thick liquids SLP reeval 05/29/14 at bedside pending  Precautions / Restrictions Precautions Precautions: Fall Restrictions Weight Bearing Restrictions: No   Prior Activity Level Pt ambulated with a cane pta daughter had noted some memory issues over the past few months Independent with adls. Daughter reports frequent falls pta with a gradual decline in balance  Home Assistive Devices / Equipment Home Assistive Devices/Equipment: Cane (specify quad or straight)  Prior Functional Level Prior Function Level of Independence: Independent  Current Functional Level Cognition  Overall Cognitive Status: Impaired/Different from baseline Difficult to assess due to: Level of arousal Current Attention Level: Focused Orientation Level: Oriented X4 Following Commands: Follows multi-step commands with increased time Safety/Judgement: Decreased awareness of safety, Decreased awareness of deficits General Comments: Patient would close eyes at times but would still respond to questions and task    Extremity Assessment (includes Sensation/Coordination)  Upper Extremity Assessment: RUE deficits/detail, LUE deficits/detail RUE Deficits / Details: patient reports shoulder surgery "a couple years ago", limited ROM and decreased strength throughout shoulder LUE Deficits / Details: patient reports rotator cuff surgery in June 2015, limited ROM and decreased strength throughout shoulder   Lower Extremity Assessment: Defer to PT evaluation    ADLs  Overall ADL's : Needs assistance/impaired Eating/Feeding: Supervision/ safety, Sitting Grooming: Supervision/safety, Sitting Grooming Details (indicate cue type and reason): unsupported EOB Upper Body Bathing: Sitting, Minimal assitance Upper Body Bathing Details (indicate cue type and reason): sitting unsupported EOB, supervision to maintain sitting balance Lower Body Bathing: Moderate assistance, Sit  to/from stand Upper Body Dressing : Minimal assistance, Sitting Upper Body Dressing Details (indicate cue type and reason): sitting unsuported EOB; supervision to maintain sitting balance Lower Body Dressing: Moderate assistance, Sit to/from stand, Cueing for safety Toilet Transfer: Moderate assistance, RW, BSC, Stand-pivot Toileting- Clothing Manipulation and Hygiene: Total assistance, +2 for physical assistance, Sit to/from stand (with use of stedy and min A to stand) Functional mobility during ADLs: +2 for physical assistance, Maximal assistance, Cueing for safety, Cueing for sequencing General ADL Comments: Patient motivated to work with therapist. Patient with bad news yesterday, hearing 6 friends have passed in the past couple  weeks; patient emotional during session secondary to this. patient sat EOB using bed rails with supervision. While seated EOB, patient worked on crossing [place] in order to increase independence with LB ADLs.  Patient stood using RW with mod assist (1 person). Patient then performed stand pivot transfer into recliner with mod assist and mod verbal cues for safety, technique, and hand placement. Patient's eyes were open more, but required verbal cues to keep them opened. Patient's son present and provided support as needed and appropriately.     Mobility  Overal bed mobility: Needs Assistance Bed Mobility: Rolling, Sidelying to Sit Rolling: Modified independent (Device/Increase time) Sidelying to sit: Supervision Supine to sit: Max assist, +2 for physical assistance Sit to supine: Max assist, +2 for physical assistance Sit to sidelying: Mod assist, +2 for physical assistance General bed mobility comments: Patient rolled towards L side with use of rails. Cues for positoining and technique    Transfers  Overall transfer level: Needs assistance Equipment used: Rolling walker (2 wheeled) Transfers: Sit to/from Stand, Stand Pivot Transfers Sit to Stand: Mod assist Stand  pivot transfers: Mod assist Squat pivot transfers: Max assist, +2 physical assistance General transfer comment: Cues required for safety, technique, and hand placement. Mod A for power up and to ensure safety    Ambulation / Gait / Stairs / Wheelchair Mobility  Ambulation/Gait Ambulation/Gait assistance: Min assist, Mod assist Ambulation Distance (Feet): 12 Feet Assistive device: Rolling walker (2 wheeled) Gait Pattern/deviations: Shuffle, Step-to pattern, Decreased dorsiflexion - left, Decreased weight shift to right, Decreased weight shift to left, Decreased dorsiflexion - right, Narrow base of support Gait velocity interpretation: Below normal speed for age/gender General Gait Details: Patient able to walk with use of RW. Mod A initailly but progressed to Min A. Chair to follow for safety. Cues for posture and use of RW.     Posture / Balance Dynamic Sitting Balance Sitting balance - Comments: needed tactile and vc's to maintain upright posture and lift head, open eyes. pt. lethargic Balance Overall balance assessment: Needs assistance Sitting-balance support: Bilateral upper extremity supported, Feet supported Sitting balance-Leahy Scale: Fair Sitting balance - Comments: needed tactile and vc's to maintain upright posture and lift head, open eyes. pt. lethargic Postural control: Left lateral lean Standing balance support: Bilateral upper extremity supported, During functional activity Standing balance-Leahy Scale: Poor Standing balance comment: patient requires hold to maintain balance    Special needs/care consideration                Bowel mgmt: 05/27/14 Bladder mgmt: continent Diabetic mgmt yes uses Victoza at home. Uncontrolled pta    Previous Home Environment Living Arrangements: Spouse/significant other  Lives With: Spouse Available Help at Discharge: Family, Available 24 hours/day Type of Home: House Home Layout: One level Home Access: Ramped entrance, Other (comment)  (due to spouse injuried, ramp has been made. comes through on) Bathroom Shower/Tub: Multimedia programmer: Handicapped height Bathroom Accessibility: Yes How Accessible: Accessible via walker (has handicapped rails recently ) [place]: No Additional Comments: pt's spouse has been receiving HH with [facility] Pt and spouse were independent [place]. Spouse with recent injury as he fell from a bucket loader as he was trimming trees with his son Airlifted to [facility] with multi trauma. He received his rehab at [facility] in [place] and is now home Mod I on [place] check on him very frequently since Mom is hopitalized.  Discharge Living Setting Plans for Discharge Living Setting: Patient's home,  Lives with (comment), Other (Comment) (spouse) Type of Home at Discharge: House Discharge Home Layout: One level Discharge Home Access: [place] entrance Discharge Bathroom Shower/Tub: Walk-in shower Discharge Bathroom Toilet: Handicapped height Discharge Bathroom Accessibility: Yes How Accessible: Accessible via walker Does the patient have any problems obtaining your medications?: No  Social/Family/Support Systems Patient Roles: Spouse, Parent Contact Information: Elowyn Clementi, Spouse . Son and dtr very supportive and with pt 24/7 in hospital Anticipated Caregiver: son and daughter Anticipated Caregiver's Contact Information: see above Ability/Limitations of Caregiver: spouse recent injury and so son and daughter will have to assist Caregiver Availability: 24/7 Discharge Plan Discussed with Primary Caregiver: Yes Is Caregiver In Agreement with Plan?: Yes Does Caregiver/Family have Issues with Lodging/Transportation while Pt is in Rehab?: No Spouse has a bedroom set up in living room over their home with hospital bed. Pt likely will be able to go directly to her own bedroom.   Goals/Additional Needs Patient/Family Goal for Rehab: supervision with PT, OT, and  SLP Expected length of stay: ELSO 10 to 14 days Dietary Needs: SLP eval at bedside 05/29/14 Pt/Family Agrees to Admission and willing to participate: Yes Program Orientation Provided & Reviewed with Pt/Caregiver Including Roles  & Responsibilities: Yes  Decrease burden of Care through IP rehab admission: n/a  Possible need for SNF placement upon discharge: I have spoken with daughter at bedside of the possibility of receiving inpt rehab care and then progressing to SNF rehab to finish her course of rehab for maximum benefit prior to return home due to her needs and the needs of her spouse. Daughter is open to that possibility. Pt has greatly improved over past two weeks. Likely can d/c directly home.  Patient Condition: This patient's medical and functional status has changed since the consult dated: 05/12/2014 in which the Rehabilitation Physician determined and documented that the patient's condition is appropriate for intensive rehabilitative care in an inpatient rehabilitation facility. See "History of Present Illness" (above) for medical update. Functional changes are: overall mod assist. Patient's medical and functional status update has been discussed with the Rehabilitation physician and patient remains appropriate for inpatient rehabilitation. Will admit to inpatient rehab today.  Preadmission Screen Completed By:  Cleatrice Burke, 05/29/2014 12:32 PM ______________________________________________________________________   Discussed status with Dr. Naaman Plummer on 05/29/14 at  1232 and received telephone approval for admission today.  Admission Coordinator:  Cleatrice Burke, time [id number] Date 05/29/2014.

## 2014-05-16 LAB — GLUCOSE, CAPILLARY
GLUCOSE-CAPILLARY: 196 mg/dL — AB (ref 70–99)
Glucose-Capillary: 170 mg/dL — ABNORMAL HIGH (ref 70–99)
Glucose-Capillary: 184 mg/dL — ABNORMAL HIGH (ref 70–99)
Glucose-Capillary: 216 mg/dL — ABNORMAL HIGH (ref 70–99)

## 2014-05-16 MED ORDER — PROMETHAZINE HCL 25 MG/ML IJ SOLN
12.5000 mg | Freq: Four times a day (QID) | INTRAMUSCULAR | Status: DC | PRN
  Administered 2014-05-16 – 2014-05-17 (×2): 12.5 mg via INTRAVENOUS
  Filled 2014-05-16 (×3): qty 1

## 2014-05-16 NOTE — Progress Notes (Signed)
Subjective: Patient reports no results from enema.  Son reports she ate a biscuit with gravy this am, "but it came right back up the tube" Objective: Vital signs in last 24 hours: Temp:  [97.2 F (36.2 C)-98.8 F (37.1 C)] 98 F (36.7 C) (02/06 0629) Pulse Rate:  [72-107] 72 (02/06 0629) Resp:  [18] 18 (02/06 0629) BP: (133-189)/(51-75) 146/51 mmHg (02/06 0629) SpO2:  [93 %-100 %] 100 % (02/06 0629)  Intake/Output from previous day: 02/05 0701 - 02/06 0700 In: 360 [P.O.:360] Out: -  Intake/Output this shift: Total I/O In: 120 [P.O.:120] Out: -   Physical Exam: NG to suction.  Abdomen soft, nontender.  There are bowel sounds.  Electrolyes normal from 2/3.  Lab Results: No results for input(s): WBC, HGB, HCT, PLT in the last 72 hours. BMET  Recent Labs  05/13/14 2108  NA 133*  K 3.8  CL 103  CO2 22  GLUCOSE 213*  BUN 40*  CREATININE 1.08  CALCIUM 8.1*    Studies/Results: Dg Abd Portable 1v  05/15/2014   CLINICAL DATA:  Nasogastric tube placement.  Initial encounter.  EXAM: PORTABLE ABDOMEN - 1 VIEW  COMPARISON:  Abdominal radiograph performed 05/08/2014  FINDINGS: The patient's enteric tube is seen ending overlying the antrum of the stomach. The side port is noted ending overlying the body of the stomach.  The visualized bowel gas pattern is grossly unremarkable. No free intra-abdominal air is identified, though evaluation is suboptimal on a single supine view. No acute osseous abnormalities are seen. Mild degenerative change is noted along the lumbar spine. The visualized lung bases are grossly clear, though not well assessed due to motion artifact. The patient is rotated.  IMPRESSION: Enteric tube seen ending overlying the antrum of the stomach.   Electronically Signed   By: Garald Balding M.D.   On: 05/15/2014 03:31   Dg Abd Portable 2v  05/15/2014   CLINICAL DATA:  Persistent vomiting  EXAM: PORTABLE ABDOMEN - 2 VIEW  COMPARISON:  Abdominal film of March 14, 2015   FINDINGS: There are scattered gas collections within small and large bowel without evidence of distention. No free extraluminal gas collections are demonstrated. The esophagogastric tube tip projects in the distal stomach or proximal duodenum. There is a small amount of gas in the rectum. There are degenerative changes of the lumbar disc levels. There are pelvic phleboliths.  IMPRESSION: There is no evidence of bowel obstruction or perforation. No abnormally distended bowel loops are demonstrated. The esophagogastric tube is in appropriate position.   Electronically Signed   By: David  [place]   On: 05/15/2014 07:40    Assessment/Plan: Will repeat enema this am.  Continue NG with ice chips and liquids.    LOS: 13 days    Derreon Consalvo D, MD 05/16/2014, 9:50 AM

## 2014-05-16 NOTE — Progress Notes (Signed)
Soap suds enema administeres 33ml sterile water 0030hrs w/o result BM, Pt did request bedpan approx 0200 hrs however no BM Pt states she does feel fluid present. As of time of note 0610 hrs pt is resting with eyes closed w/o obvious distress, no c/o n/v through night, no BM

## 2014-05-17 LAB — GLUCOSE, CAPILLARY
GLUCOSE-CAPILLARY: 178 mg/dL — AB (ref 70–99)
Glucose-Capillary: 205 mg/dL — ABNORMAL HIGH (ref 70–99)
Glucose-Capillary: 177 mg/dL — ABNORMAL HIGH (ref 70–99)
Glucose-Capillary: 160 mg/dL — ABNORMAL HIGH (ref 70–99)

## 2014-05-17 NOTE — Progress Notes (Signed)
Patient ID: Morgan [name], female   DOB: Mar 30, 1941, 74 y.o.   MRN: [id number] Vital signs appear stable Motor function reveals patient is alert NG tube is to low intermittent suction yet patient is receiving full liquid diet Discussed with patient's son Make patient nothing by mouth until nausea subsides

## 2014-05-18 LAB — GLUCOSE, CAPILLARY: GLUCOSE-CAPILLARY: 221 mg/dL — AB (ref 70–99)

## 2014-05-18 MED ORDER — SCOPOLAMINE 1 MG/3DAYS TD PT72
1.0000 | MEDICATED_PATCH | TRANSDERMAL | Status: DC
  Administered 2014-05-18 – 2014-05-29 (×5): 1.5 mg via TRANSDERMAL
  Filled 2014-05-18 (×9): qty 1

## 2014-05-18 MED ORDER — METOCLOPRAMIDE HCL 5 MG/ML IJ SOLN
10.0000 mg | Freq: Two times a day (BID) | INTRAMUSCULAR | Status: DC
  Administered 2014-05-18 – 2014-05-24 (×12): 10 mg via INTRAVENOUS
  Filled 2014-05-18 (×12): qty 2

## 2014-05-18 MED ORDER — POLYETHYLENE GLYCOL 3350 17 G PO PACK
17.0000 g | PACK | Freq: Two times a day (BID) | ORAL | Status: DC
  Administered 2014-05-18 – 2014-05-29 (×13): 17 g via ORAL
  Filled 2014-05-18 (×15): qty 1

## 2014-05-18 MED ORDER — METOCLOPRAMIDE HCL 5 MG/ML IJ SOLN: 10.0000 mg | Freq: Three times a day (TID) | INTRAMUSCULAR | Status: DC

## 2014-05-18 NOTE — Consult Note (Signed)
Subjective:   HPI  The patient is a 74 year old female who underwent a right frontal craniotomy for benign meningioma resection on January 29 of this year. Since then she has been having problems with vomiting, she has an NG in place. She also complains of constipation and that she has not had a bowel movement in 2 weeks. Abdominal x-ray does not reveal any evidence of bowel obstruction. She is very nauseated when she is moved. She states when her head move she gets nauseated. She has been unable to have a bowel movement despite enemas. She has been unable to tolerate any type of diet because of nausea and vomiting and requirement of an NG tube. She did not have this problem prior to admission to the hospital.     Past Medical History  Diagnosis Date  . Diabetes mellitus   . Obesity   . Diabetes type 2, uncontrolled   . Hypoglycemia associated with diabetes   . Edema of both legs   . Hypertension   . Combined hyperlipidemia   . Acquired autoimmune hypothyroidism   . Thyroiditis, autoimmune   . Abnormal liver function tests   . Sleep apnea, obstructive   . History of gastroesophageal reflux (GERD)   . Depression   . DM neuropathy with neurologic complication   . COPD with acute exacerbation   . Goiter   . Fatigue   . Vertigo   . Type II diabetes mellitus with peripheral angiopathy   . Gout   . Pallor    Past Surgical History  Procedure Laterality Date  . Laparoscopic gastric banding    . Back surgery    . Craniotomy Right 05/08/2014    Procedure: CRANIOTOMY FOR MENINGIOMA;  Surgeon: Ashok Pall, MD;  Location: [facility];  Service: Neurosurgery;  Laterality: Right;  Right Craniotomy for meningioma   History   Social History  . Marital Status: Married    Spouse Name: N/A    Number of Children: N/A  . Years of Education: N/A   Occupational History  . Not on file.   Social History Main Topics  . Smoking status: Never Smoker   . Smokeless tobacco: Not on file  .  Alcohol Use: No  . Drug Use: No  . Sexual Activity: No   Other Topics Concern  . Not on file   Social History Narrative   family history includes Diabetes in her brother, father, and sister.  Current facility-administered medications:  .  0.9 %  sodium chloride infusion, , Intravenous, Continuous, Wilhelmina Mcardle, MD, Last Rate: 10 mL/hr at 05/16/14 0857 .  acetaminophen (TYLENOL) suppository 650 mg, 650 mg, Rectal, Q6H PRN, Roland Rack, MD .  acetaminophen (TYLENOL) tablet 650 mg, 650 mg, Oral, Q6H PRN, Cherene Altes, MD, 650 mg at 05/13/14 1132 .  acetaminophen-codeine (TYLENOL #3) 300-30 MG per tablet 1 tablet, 1 tablet, Oral, Q4H PRN, Ashok Pall, MD .  anastrozole (ARIMIDEX) tablet 1 mg, 1 mg, Oral, Daily, Cherene Altes, MD, 1 mg at 05/18/14 1018 .  cloNIDine (CATAPRES - Dosed in mg/24 hr) patch 0.3 mg, 0.3 mg, Transdermal, Weekly, Ashok Pall, MD, 0.3 mg at 05/14/14 2320 .  dexamethasone (DECADRON) injection 2 mg, 2 mg, Intravenous, Q12H, Ashok Pall, MD, 2 mg at 05/18/14 1047 .  feeding supplement (RESOURCE BREEZE) (RESOURCE BREEZE) liquid 1 Container, 1 Container, Oral, BID PC, Baird Lyons, RD, Stopped at 05/17/14 1004 .  furosemide (LASIX) tablet 40 mg, 40 mg, Oral, BID, Ashok Pall,  MD, 40 mg at 05/18/14 1744 .  hydrALAZINE (APRESOLINE) injection 10-40 mg, 10-40 mg, Intravenous, Q4H PRN, Wilhelmina Mcardle, MD, 10 mg at 05/16/14 2225 .  HYDROcodone-acetaminophen (NORCO/VICODIN) 5-325 MG per tablet 1 tablet, 1 tablet, Oral, Q6H PRN, Ashok Pall, MD .  HYDROmorphone (DILAUDID) injection 1-1.5 mg, 1-1.5 mg, Intramuscular, Q4H PRN, Faythe Ghee, MD, 1 mg at 05/18/14 0436 .  insulin aspart (novoLOG) injection 0-20 Units, 0-20 Units, Subcutaneous, TID WC, Wilhelmina Mcardle, MD, 4 Units at 05/18/14 1744 .  insulin aspart (novoLOG) injection 0-5 Units, 0-5 Units, Subcutaneous, QHS, Wilhelmina Mcardle, MD, 2 Units at 05/16/14 2215 .  insulin aspart (novoLOG) injection  4 Units, 4 Units, Subcutaneous, TID WC, Wilhelmina Mcardle, MD, Stopped at 05/17/14 1005 .  lacosamide (VIMPAT) 200 mg in sodium chloride 0.9 % 25 mL IVPB, 200 mg, Intravenous, Q12H, Roland Rack, MD, 200 mg at 05/18/14 0824 .  levETIRAcetam (KEPPRA) IVPB 500 mg/100 mL premix, 500 mg, Intravenous, 3 times per day, Ashok Pall, MD, 500 mg at 05/18/14 1413 .  levothyroxine (SYNTHROID, LEVOTHROID) tablet 200 mcg, 200 mcg, Oral, Once per day on Mon Tue Wed Thu Fri Sat, Ashok Pall, MD, 200 mcg at 05/18/14 1018 .  levothyroxine (SYNTHROID, LEVOTHROID) tablet 300 mcg, 300 mcg, Oral, Once every 7 days, Ashok Pall, MD, 300 mcg at 05/17/14 1015 .  Liraglutide SOPN 1.8 mg, 1.8 mg, Subcutaneous, Daily, Ashok Pall, MD, 1.8 mg at 05/18/14 1126 .  mometasone-formoterol (DULERA) 100-5 MCG/ACT inhaler 2 puff, 2 puff, Inhalation, BID, Ashok Pall, MD, 2 puff at 05/18/14 [phone] .  multivitamin with minerals tablet 1 tablet, 1 tablet, Oral, Daily, Baird Lyons, RD, 1 tablet at 05/18/14 1018 .  nitroGLYCERIN (NITROSTAT) SL tablet 0.4 mg, 0.4 mg, Sublingual, Q5 min PRN, Ashok Pall, MD .  ondansetron [facility]) injection 4 mg, 4 mg, Intravenous, Q6H PRN, Charlie Pitter, MD, 4 mg at 05/16/14 1208 .  oxyCODONE (Oxy IR/ROXICODONE) immediate release tablet 5-10 mg, 5-10 mg, Oral, Q4H PRN, Ashok Pall, MD, 5 mg at 05/13/14 1738 .  pantoprazole (PROTONIX) EC tablet 40 mg, 40 mg, Oral, Q1200, Wilhelmina Mcardle, MD, 40 mg at 05/18/14 1233 .  phenol (CHLORASEPTIC) mouth spray 1 spray, 1 spray, Mouth/Throat, PRN, Faythe Ghee, MD, 1 spray at 05/15/14 0730 .  polyethylene glycol (MIRALAX / GLYCOLAX) packet 17 g, 17 g, Oral, BID, Wonda Horner, MD .  prochlorperazine (COMPAZINE) suppository 25 mg, 25 mg, Rectal, Q12H PRN, Ashok Pall, MD, 25 mg at 05/13/14 1543 .  promethazine (PHENERGAN) injection 12.5 mg, 12.5 mg, Intravenous, Q6H PRN, Erline Levine, MD, 12.5 mg at 05/17/14 2228 .  sodium chloride 0.9 % injection 10-40  mL, 10-40 mL, Intracatheter, PRN, Ashok Pall, MD, 10 mL at 05/14/14 0907 Allergies  Allergen Reactions  . Latex Rash  . Sulfa Antibiotics Other (See Comments)    Unknown allergic reaction  . Penicillins Rash     Objective:     BP 174/54 mmHg  Pulse 86  Temp(Src) 98.8 F (37.1 C) (Oral)  Resp 20  Ht 5\' 4"  (1.626 m)  Wt 108.1 kg (238 lb 5.1 oz)  BMI 40.89 kg/m2  SpO2 96%  An NG tube is in place. She is retching with it in place.  Heart regular rhythm  Lungs clear  Abdomen: Bowel sounds are present, soft, nontender  Laboratory No components found for: D1    Assessment:     #1. Status post right frontal craniotomy for benign meningioma  #  2. Continued nausea and vomiting postop  #3. Constipation postop      Plan:     There does not appear to be a bowel obstruction causing her symptoms. In regards to the nausea and vomiting she has been on either Phenergan which makes her sleepy and Zofran which doesn't seem to help that much according to her. We could certainly consider trying the addition of Reglan IV and see if this helps. She does have a history of diabetes and it is possible that she has developed gastroparesis. Reglan might help in this situation. Her nausea with head movement suggests a more central nervous system or inner ear etiology. Her constipation did not improve with enemas. I am going to start Miralax twice a day per NG tube. In regards to her lack of taking in nutrition, I think it might be reasonable to startTNA since we are currently unable to use her GI tract. Would ask that nutritional service get this started. They are seeing her and recommended placing a long Panda tube into her small intestines, but at this time I doubt that that is feasible. . Lab Results  Component Value Date   HGB 10.5* 05/10/2014   HGB 11.6* 05/09/2014   HGB 12.4 05/08/2014   HCT 30.7* 05/10/2014   HCT 33.2* 05/09/2014   HCT 35.6* 05/08/2014   ALKPHOS 105 05/09/2014    ALKPHOS 120* 05/07/2014   ALKPHOS 165* 05/03/2014   AST 68* 05/09/2014   AST 47* 05/07/2014   AST 67* 05/03/2014   ALT 63* 05/09/2014   ALT 47* 05/07/2014   ALT 54* 05/03/2014   AMYLASE 19 05/13/2014

## 2014-05-18 NOTE — Progress Notes (Signed)
NUTRITION FOLLOW UP  Intervention:   Diet advancement per MD Continue Resource Breeze BID after meals when diet is advanced RD to continue to monitor  Patient has now gone 1 week without nutrition due to N/V. If unable to tolerate PO intake within 24-48 hours, recommend placing Nasojejunal feeding tube past the ligament of treitz for short term nutrition support. Recommend providing Vital AF 1.2 via NJ-tube @ 20 ml/hr and increasing by 10 ml/hr every 8 hours to goal rate of 60 ml/hr. This will provide 1728 kcal, 108 grams of protein, and 1168 ml of water.  Recommend monitoring magnesium, potassium, and phosphorus daily for at least 3 days, MD to replete as needed, as pt is at risk for refeeding syndrome given inability to tolerate PO intake > 7 days.    Nutrition Dx:   Inadequate oral intake related to nausea and vomiting as evidenced by pt's report and 3 lb weight loss; ongoing  New Goal: Pt to meet >/= 90% of their estimated nutrition needs; unmet  Monitor:   PO intake, supplement acceptance, weight trend, labs  Assessment:   Pt admitted 1/24 with AMS, found to have seizures in the setting of meningioma. S/p resection 1/29 and remained intubated.   Pt made NPO yesterday due to ongoing nausea. Pt has NGT in place to low intermittent suction. Per daughter pt ate a peanut butter cracker yesterday morning and vomited shortly after. Nausea improved but, ongoing today; nausea mostly with movement. Per daughter pt has not had much pain. Last BM was 2/6 and was very small amount. Pt asking for ice chips and milkshakes. Per daughter, awaiting GI consult.  Pt has now had minimal PO intake for 1 week. ? Refeeding risk  Labs: elevated glucose, low sodium, elevated BUN, low calcium  Height: Ht Readings from Last 1 Encounters:  05/08/14 5\' 4"  (1.626 m)    Weight Status:   Wt Readings from Last 1 Encounters:  05/12/14 238 lb 5.1 oz (108.1 kg)    Re-estimated needs:  Kcal:  1700-1950 Protein: 95-110 grams Fluid: 2.8 L/day  Skin: closed incision on head; +1 facial edema, +1 generalized edema  Diet Order: Diet NPO time specified   Intake/Output Summary (Last 24 hours) at 05/18/14 1537 Last data filed at 05/18/14 0630  Gross per 24 hour  Intake      0 ml  Output    400 ml  Net   -400 ml    Last BM: 2/6   Labs:   Recent Labs Lab 05/13/14 2108  NA 133*  K 3.8  CL 103  CO2 22  BUN 40*  CREATININE 1.08  CALCIUM 8.1*  GLUCOSE 213*    CBG (last 3)   Recent Labs  05/17/14 1608 05/17/14 2232 05/18/14 0648  GLUCAP 177* 160* 221*    Scheduled Meds: . anastrozole  1 mg Oral Daily  . cloNIDine  0.3 mg Transdermal Weekly  . dexamethasone  2 mg Intravenous Q12H  . feeding supplement (RESOURCE BREEZE)  1 Container Oral BID PC  . furosemide  40 mg Oral BID  . insulin aspart  0-20 Units Subcutaneous TID WC  . insulin aspart  0-5 Units Subcutaneous QHS  . insulin aspart  4 Units Subcutaneous TID WC  . lacosamide (VIMPAT) IV  200 mg Intravenous Q12H  . levETIRAcetam  500 mg Intravenous 3 times per day  . levothyroxine  200 mcg Oral Once per day on Mon Tue Wed Thu Fri Sat  . levothyroxine  300 mcg  Oral Once every 7 days  . Liraglutide  1.8 mg Subcutaneous Daily  . mometasone-formoterol  2 puff Inhalation BID  . multivitamin with minerals  1 tablet Oral Daily  . pantoprazole  40 mg Oral Q1200    Continuous Infusions: . sodium chloride 10 mL/hr at 05/16/14 0857    Pryor Ochoa RD, LDN Inpatient Clinical Dietitian Pager: [phone] After Hours Pager: [phone]

## 2014-05-18 NOTE — Progress Notes (Signed)
Occupational Therapy Treatment Patient Details Name: Morgan [name] MRN: [id number] DOB: [birth year] Today's Date: 05/18/2014    History of present illness pt presents with Craniotomy post Meningioma Resection, Seizures, and VDRF.     OT comments  Patient progressing towards goals, continue plan of care for now. Patient more alert and following one-step commands consistently. Continue to recommend CIR for extensive comprehensive rehabilitation.    Follow Up Recommendations  CIR;Supervision/Assistance - 24 hour    Equipment Recommendations   (TBD)    Recommendations for Other Services Rehab consult    Precautions / Restrictions Precautions Precautions: Fall Restrictions Weight Bearing Restrictions: No       Mobility Bed Mobility Overal bed mobility: Needs Assistance Bed Mobility: Rolling;Sidelying to Sit Rolling: Min guard Sidelying to sit: Min assist       General bed mobility comments: Using bed rails and with HOB elevated. Patient following one-step commands consistently.   Transfers Overall transfer level: Needs assistance Equipment used: Ambulation equipment used (STEDY) Transfers: Sit to/from Stand Sit to Stand: Min assist;+2 safety/equipment                  ADL   General ADL Comments: Patient a little more alert and following commands much better today. Patient engaged in bed mobility with min assist and sat EOB with min assist using bed rails and HOB elevated. Patient able to maintain sitting balance with supervision and stood using STEDY. Patient performed two sit<>stands with STEDY, then transferred > recliner. 02=97%, HR =114 after activity, BP=182/73 while in stedy. Patient required min encouragement to work with therapists, but seems motivated to get stronger..                 Cognition   Behavior During Therapy: Flat affect Overall Cognitive Status: Within Functional Limits for tasks assessed   General Comments: Patients level of arousal  has improved. Patient able to follow one-step commands consistently today. Patient did not perseverate on tasks, but did require min encouragement to particiate.                  Pertinent Vitals/ Pain       Pain Assessment: No/denies pain         Frequency Min 3X/week     Progress Toward Goals  OT Goals(current goals can now be found in the care plan section)  Progress towards OT goals: Progressing toward goals     Plan Discharge plan remains appropriate    Co-evaluation    PT/OT/SLP Co-Evaluation/Treatment: Yes Reason for Co-Treatment: Complexity of the patient's impairments (multi-system involvement);For patient/therapist safety   OT goals addressed during session: ADL's and self-care      End of Session Equipment Utilized During Treatment: Gait belt;Other (comment) (STEDY)   Activity Tolerance Patient limited by fatigue   Patient Left with call bell/phone within reach;with family/visitor present;in chair   Nurse Communication Mobility status;Need for lift equipment        Time: 1335-1409 OT Time Calculation (min): 34 min  Charges: OT General Charges $OT Visit: 1 Procedure OT Treatments $Therapeutic Activity: 8-22 mins  Ivon Oelkers , MS, OTR/L, CLT Pager: [phone]  05/18/2014, 2:18 PM

## 2014-05-18 NOTE — Progress Notes (Signed)
Physical Therapy Treatment Patient Details Name: Morgan [name] MRN: [id number] DOB: 09-09-1940 Today's Date: 05/18/2014    History of Present Illness pt presents with Craniotomy post Meningioma Resection, Seizures, and VDRF.      PT Comments    Pt. Was able to do four bouts of partial sit to stand in the STEDY with min A. Pt. Getting more independent with bed mobility to with verbal cues. Pt. Reported nausea intermittently during session but no vomiting. Waiting on a GI consult before moving to CIR for ongoing PT needs.  Follow Up Recommendations  CIR     Equipment Recommendations  None recommended by PT    Recommendations for Other Services Rehab consult     Precautions / Restrictions Precautions Precautions: Fall Restrictions Weight Bearing Restrictions: No    Mobility  Bed Mobility Overal bed mobility: Needs Assistance Bed Mobility: Rolling;Sidelying to Sit Rolling: Min guard Sidelying to sit: Min assist       General bed mobility comments: Using bed rails and with HOB elevated. Patient following one-step commands consistently.   Transfers Overall transfer level: Needs assistance Equipment used: Ambulation equipment used Transfers: Sit to/from Stand Sit to Stand: Min assist;+2 safety/equipment         General transfer comment: Much safer and able to follow commands to assist to transfer to sitting in the chair from the STEDY. Difficulty pulling herself up enough to remove the panels from behind her, so min A to rise.  Ambulation/Gait                 Stairs            Wheelchair Mobility    Modified Rankin (Stroke Patients Only)       Balance                                    Cognition Arousal/Alertness: Lethargic Behavior During Therapy: Flat affect Overall Cognitive Status: Within Functional Limits for tasks assessed Area of Impairment: Attention;Awareness;Following commands   Current Attention Level: Sustained    Following Commands: Follows one step commands consistently   Awareness: Emergent Problem Solving: Slow processing;Difficulty sequencing;Requires verbal cues;Decreased initiation General Comments: Patients level of arousal has improved. Patient able to follow one-step commands consistently today. Patient did not perseverate on tasks, but did require min encouragement to particiate.     Exercises      General Comments        Pertinent Vitals/Pain Pain Assessment: No/denies pain Pain Location: Complaints of nausea during some transitional movements Pain Intervention(s): Monitored during session;Repositioned    Home Living                      Prior Function            PT Goals (current goals can now be found in the care plan section) Progress towards PT goals: Progressing toward goals    Frequency  Min 3X/week    PT Plan Current plan remains appropriate    Co-evaluation PT/OT/SLP Co-Evaluation/Treatment: Yes Reason for Co-Treatment: Complexity of the patient's impairments (multi-system involvement);For patient/therapist safety PT goals addressed during session: Mobility/safety with mobility;Strengthening/ROM OT goals addressed during session: ADL's and self-care     End of Session Equipment Utilized During Treatment: Gait belt Activity Tolerance: Patient limited by lethargy Patient left: in chair;with call bell/phone within reach;with family/visitor present     Time: 1334-1410 PT Time Calculation (  min) (ACUTE ONLY): 36 min  Charges:                       G Codes:      Jodi Geralds, SPTA 05/18/2014, 2:30 PM

## 2014-05-18 NOTE — Progress Notes (Signed)
Inpatient Diabetes Program Recommendations  AACE/ADA: New Consensus Statement on Inpatient Glycemic Control (2013)  Target Ranges:  Prepandial:   less than 140 mg/dL      Peak postprandial:   less than 180 mg/dL (1-2 hours)      Critically ill patients:  140 - 180 mg/dL   Reason for Visit: Hyperglycemia  Results for [name], Morgan [name] (MRN [id number]) as of 05/18/2014 16:25  Ref. Range 05/17/2014 06:38 05/17/2014 11:45 05/17/2014 16:08 05/17/2014 22:32 05/18/2014 06:48  Glucose-Capillary Latest Range: 70-99 mg/dL 205 (H) 178 (H) 177 (H) 160 (H) 221 (H)   Needs basal insulin. Consider addition of Lantus 20 units Q24H. D/C Novolog 4 units tidwc since pt is NPO.  Will continue to follow. Thank you. Lorenda Peck, RD, LDN, CDE Inpatient Diabetes Coordinator [phone]

## 2014-05-18 NOTE — Progress Notes (Signed)
Inpatient Rehabilitation  I note pt. Still has NG tube.  Daughter in room and says she is waiting on GI consult.  I told her we are awaiting medical readiness and bed availability for possible IP Rehab admission.  Danne Baxter will follow up with pt. Tomorrow.  Please call me if questions today.  [place] Admissions Coordinator Cell [phone] Office [phone]

## 2014-05-18 NOTE — Progress Notes (Signed)
Patient ID: Morgan [name], female   DOB: May 31, 1940, 74 y.o.   MRN: [id number] BP 174/58 mmHg  Pulse 86  Temp(Src) 98.8 F (37.1 C) (Oral)  Resp 20  Ht 5' 4"  (1.626 m)  Wt 108.1 kg (238 lb 5.1 oz)  BMI 40.89 kg/m2  SpO2 96% Alert and oriented x 4, speech is clear Following all commands Appreciate gi consult, have started reglan 10 q 12 Will consult medicine for diabetes control.  Wound is clean, dry, no signs of infection Craniotomy would not cause dizziness.

## 2014-05-19 LAB — GLUCOSE, CAPILLARY
GLUCOSE-CAPILLARY: 147 mg/dL — AB (ref 70–99)
GLUCOSE-CAPILLARY: 127 mg/dL — AB (ref 70–99)
GLUCOSE-CAPILLARY: 154 mg/dL — AB (ref 70–99)
GLUCOSE-CAPILLARY: 148 mg/dL — AB (ref 70–99)
Glucose-Capillary: 200 mg/dL — ABNORMAL HIGH (ref 70–99)

## 2014-05-19 LAB — BASIC METABOLIC PANEL
ANION GAP: 10 (ref 5–15)
BUN: 19 mg/dL (ref 6–23)
CHLORIDE: 98 mmol/L (ref 96–112)
CO2: 29 mmol/L (ref 19–32)
Calcium: 7.6 mg/dL — ABNORMAL LOW (ref 8.4–10.5)
Creatinine, Ser: 1.02 mg/dL (ref 0.50–1.10)
GFR calc Af Amer: 62 mL/min — ABNORMAL LOW (ref >90)
GFR calc non Af Amer: 53 mL/min — ABNORMAL LOW (ref >90)
GLUCOSE: 142 mg/dL — AB (ref 70–99)
POTASSIUM: 3.1 mmol/L — AB (ref 3.5–5.1)
Sodium: 137 mmol/L (ref 135–145)

## 2014-05-19 LAB — MAGNESIUM: MAGNESIUM: 1.6 mg/dL (ref 1.5–2.5)

## 2014-05-19 LAB — PHOSPHORUS: Phosphorus: 3.5 mg/dL (ref 2.3–4.6)

## 2014-05-19 MED ORDER — POTASSIUM CHLORIDE 10 MEQ/50ML IV SOLN
10.0000 meq | INTRAVENOUS | Status: AC
  Administered 2014-05-19 (×4): 10 meq via INTRAVENOUS
  Filled 2014-05-19 (×6): qty 50

## 2014-05-19 MED ORDER — GUAIFENESIN ER 600 MG PO TB12: 600.0000 mg | ORAL_TABLET | Freq: Two times a day (BID) | ORAL | Status: DC

## 2014-05-19 MED ORDER — M.V.I. ADULT IV INJ
INTRAVENOUS | Status: AC
  Administered 2014-05-19: 18:00:00 via INTRAVENOUS
  Filled 2014-05-19: qty 720

## 2014-05-19 MED ORDER — INSULIN ASPART 100 UNIT/ML ~~LOC~~ SOLN
0.0000 [IU] | SUBCUTANEOUS | Status: DC
  Administered 2014-05-19: 3 [IU] via SUBCUTANEOUS
  Administered 2014-05-19 – 2014-05-20 (×4): 4 [IU] via SUBCUTANEOUS
  Administered 2014-05-20 (×2): 7 [IU] via SUBCUTANEOUS
  Administered 2014-05-20 – 2014-05-21 (×5): 4 [IU] via SUBCUTANEOUS
  Administered 2014-05-21: 7 [IU] via SUBCUTANEOUS
  Administered 2014-05-21 – 2014-05-22 (×2): 4 [IU] via SUBCUTANEOUS
  Administered 2014-05-22: 3 [IU] via SUBCUTANEOUS
  Administered 2014-05-22: 4 [IU] via SUBCUTANEOUS
  Administered 2014-05-22 (×2): 7 [IU] via SUBCUTANEOUS
  Administered 2014-05-23: 4 [IU] via SUBCUTANEOUS
  Administered 2014-05-23: 11 [IU] via SUBCUTANEOUS
  Administered 2014-05-23: 4 [IU] via SUBCUTANEOUS
  Administered 2014-05-23: 7 [IU] via SUBCUTANEOUS
  Administered 2014-05-24: 4 [IU] via SUBCUTANEOUS
  Administered 2014-05-24 (×3): 3 [IU] via SUBCUTANEOUS
  Administered 2014-05-24: 11 [IU] via SUBCUTANEOUS
  Administered 2014-05-24 – 2014-05-25 (×2): 7 [IU] via SUBCUTANEOUS
  Administered 2014-05-25 (×4): 4 [IU] via SUBCUTANEOUS
  Administered 2014-05-26: 3 [IU] via SUBCUTANEOUS
  Administered 2014-05-26 (×2): 4 [IU] via SUBCUTANEOUS
  Administered 2014-05-26 (×2): 7 [IU] via SUBCUTANEOUS
  Administered 2014-05-27 (×4): 4 [IU] via SUBCUTANEOUS
  Administered 2014-05-27: 7 [IU] via SUBCUTANEOUS
  Administered 2014-05-27: 11 [IU] via SUBCUTANEOUS
  Administered 2014-05-28: 4 [IU] via SUBCUTANEOUS
  Administered 2014-05-28: 1 [IU] via SUBCUTANEOUS
  Administered 2014-05-28: 4 [IU] via SUBCUTANEOUS
  Administered 2014-05-28: 7 [IU] via SUBCUTANEOUS
  Administered 2014-05-28 – 2014-05-29 (×3): 4 [IU] via SUBCUTANEOUS
  Administered 2014-05-29: 3 [IU] via SUBCUTANEOUS
  Administered 2014-05-29: 11 [IU] via SUBCUTANEOUS
  Administered 2014-05-29: 3 [IU] via SUBCUTANEOUS

## 2014-05-19 MED ORDER — GUAIFENESIN 100 MG/5ML PO SYRP
400.0000 mg | ORAL_SOLUTION | ORAL | Status: DC
  Administered 2014-05-19 – 2014-05-24 (×12): 400 mg via ORAL
  Filled 2014-05-19 (×33): qty 20

## 2014-05-19 MED ORDER — FAT EMULSION 20 % IV EMUL
250.0000 mL | INTRAVENOUS | Status: AC
  Administered 2014-05-19: 250 mL via INTRAVENOUS
  Filled 2014-05-19: qty 250

## 2014-05-19 MED ORDER — WHITE PETROLATUM GEL
Status: AC
  Administered 2014-05-20
  Filled 2014-05-19: qty 1

## 2014-05-19 MED ORDER — MAGNESIUM SULFATE 2 GM/50ML IV SOLN
2.0000 g | Freq: Once | INTRAVENOUS | Status: AC
  Administered 2014-05-20: 2 g via INTRAVENOUS
  Filled 2014-05-19: qty 50

## 2014-05-19 NOTE — Progress Notes (Addendum)
PARENTERAL NUTRITION CONSULT NOTE - INITIAL  Pharmacy Consult:  TPN Indication:  Bowel rest  Allergies  Allergen Reactions  . Latex Rash  . Sulfa Antibiotics Other (See Comments)    Unknown allergic reaction  . Penicillins Rash    Patient Measurements: Height: 5\' 4"  (162.6 cm) Weight: 238 lb 5.1 oz (108.1 kg) IBW/kg (Calculated) : 54.7  Vital Signs: Temp: 98.4 F (36.9 C) (02/09 0915) Temp Source: Oral (02/09 0915) BP: 165/45 mmHg (02/09 0915) Pulse Rate: 83 (02/09 0915) Intake/Output from previous day: 02/08 0701 - 02/09 0700 In: -  Out: 1 [Urine:1]  Labs: No results for input(s): WBC, HGB, HCT, PLT, APTT, INR in the last 72 hours.  No results for input(s): NA, K, CL, CO2, GLUCOSE, BUN, CREATININE, LABCREA, CREAT24HRUR, CALCIUM, MG, PHOS, PROT, ALBUMIN, AST, ALT, ALKPHOS, BILITOT, BILIDIR, IBILI, PREALBUMIN, TRIG, CHOLHDL, CHOL in the last 72 hours. Estimated Creatinine Clearance: 55.7 mL/min (by C-G formula based on Cr of 1.08).    Recent Labs  05/17/14 1608 05/17/14 2232 05/18/14 0648  GLUCAP 177* 160* 221*    Medical History: Past Medical History  Diagnosis Date  . Diabetes mellitus   . Obesity   . Diabetes type 2, uncontrolled   . Hypoglycemia associated with diabetes   . Edema of both legs   . Hypertension   . Combined hyperlipidemia   . Acquired autoimmune hypothyroidism   . Thyroiditis, autoimmune   . Abnormal liver function tests   . Sleep apnea, obstructive   . History of gastroesophageal reflux (GERD)   . Depression   . DM neuropathy with neurologic complication   . COPD with acute exacerbation   . Goiter   . Fatigue   . Vertigo   . Type II diabetes mellitus with peripheral angiopathy   . Gout   . Pallor       Insulin Requirements in the past 24 hours:  14 units resistant SSI + Victoza 1.8 mg   Assessment: Morgan [name] presented on 05/03/14 with AMS and left-sided weakness. Found to have new right frontal meningioma that required  resection on 05/08/14.  Patient did not tolerate PO intake post-op due to persistent nausea and vomiting.  Unable to use GI tract per Gertie Fey and Pharmacy consulted to initiate TPN for nutritional support.  She is at risk for refeeding syndrome.  GI: obesity / GERD.  Persistent N/V, constipation - NGT for decompression, Miralax, Reglan, multivitamin, PPI PO Endo: hypothyroidism on Synthroid.  DM2 - CBGs uncontrolled (160-221) prior to TPN initiation - on SSI + meal coverage + Victoza Lytes:  Renal:  Pulm: COPD - stable on RA - Dulera Cards: HTN / HLD - BP elevated, HR mostly controlled - clonidine, Lasix Hepatobil: 1/30 labs - AST/ALT mildly elevated, others WNL.  Lipase/amylase WNL. Heme/Onc: 1/31 labs - mild anemia, plts low at 146 - anastrozole Neuro: depression / neuropathy.  New right frontal meningioma causing seizure, s/p resection on 1/29 - Vimpat, Keppra, scopolamine patch ID: afebrile, WBC decreasing - not on abx Best Practices: SCDs TPN Access: right IJ placed 05/17/14 TPN day#: 0 (2/9 >> )  Current Nutrition:  NPO  Nutritional Goals:  1700-1900 kCal, 95-110grams of protein per day   Plan:  - Clinimix E 5/15 at 30 ml/hr (goal 83 ml/hr) + IVFE at 10 ml/hr.  Will advance slow given risk of refeeding and uncontrolled blood sugar. - Daily multivitamin and trace elements - Change current SSI to Q4H + continue Victoza + add 10 units regular insulin  to TPN - F/U with possibility of enteral nutrition - F/U TPN labs   Thuy D. Mina Marble, PharmD, BCPS Pager:  [phone] 05/19/2014, 3:00 PM  Addendum: Evaristo Bury: K 3.1, Phos 3.5, Mg 1.6 Renal: SCr stable, est CrCl ~55 ml/min  Plan: 1) Potassium chloride 105mEq/50ml q1h x 6 2) Magnesium sulfate 2gm IV once 3) Will f/u BMET, Mg, Phos in a.m.  Sherlon Handing, PharmD, BCPS Clinical pharmacist, pager [phone] 05/19/2014  4:13 PM

## 2014-05-19 NOTE — Progress Notes (Signed)
NUTRITION FOLLOW UP/CONSULT  Intervention:   TPN per pharmacy Diet advancement per MD RD to continue to monitor  Recommend monitoring magnesium, potassium, and phosphorus daily for at least 3 days, MD to replete as needed, as pt is at risk for refeeding syndrome given inability to tolerate PO intake > 7 days.    Nutrition Dx:   Inadequate oral intake related to nausea and vomiting as evidenced by pt's report and 3 lb weight loss; ongoing  New Goal: Pt to meet >/= 90% of their estimated nutrition needs; unmet  Monitor:   PO intake, supplement acceptance, weight trend, labs  Assessment:   Pt admitted 1/24 with AMS, found to have seizures in the setting of meningioma. S/p resection 1/29 and remained intubated.   Pt asleep at time of visit. Per pt's son at bedside, plan to start IV nutrition today. RD consulted this afternoon for new TNA/TPN. Per son, pt has not complained of nausea today but, she remains NPO with NGT and has moved very little today. Pt was started on IV Reglan last night per GI.  Per pharmacy note, will advance TPN slow given risk of refeeding and uncontrolled blood sugar.   Labs: elevated glucose, low potassium, low calcium; magnesium and phosphorus WNL  Height: Ht Readings from Last 1 Encounters:  05/08/14 5\' 4"  (1.626 m)    Weight Status:   Wt Readings from Last 1 Encounters:  05/12/14 238 lb 5.1 oz (108.1 kg)    Re-estimated needs:  Kcal: 1700-1950 Protein: 95-110 grams Fluid: 2.8 L/day  Skin: closed incision on head; +1 facial edema, +1 generalized edema  Diet Order: Diet NPO time specified TPN (CLINIMIX-E) Adult   Intake/Output Summary (Last 24 hours) at 05/19/14 1526 Last data filed at 05/18/14 2240  Gross per 24 hour  Intake      0 ml  Output      1 ml  Net     -1 ml    Last BM: 2/6   Labs:   Recent Labs Lab 05/13/14 2108 05/19/14 1425  NA 133* 137  K 3.8 3.1*  CL 103 98  CO2 22 29  BUN 40* 19  CREATININE 1.08 1.02  CALCIUM  8.1* 7.6*  MG  --  1.6  PHOS  --  3.5  GLUCOSE 213* 142*    CBG (last 3)   Recent Labs  05/17/14 2232 05/18/14 0648 05/19/14 1155  GLUCAP 160* 221* 127*    Scheduled Meds: . anastrozole  1 mg Oral Daily  . cloNIDine  0.3 mg Transdermal Weekly  . feeding supplement (RESOURCE BREEZE)  1 Container Oral BID PC  . furosemide  40 mg Oral BID  . insulin aspart  0-20 Units Subcutaneous 6 times per day  . lacosamide (VIMPAT) IV  200 mg Intravenous Q12H  . levETIRAcetam  500 mg Intravenous 3 times per day  . levothyroxine  200 mcg Oral Once per day on Mon Tue Wed Thu Fri Sat  . levothyroxine  300 mcg Oral Once every 7 days  . Liraglutide  1.8 mg Subcutaneous Daily  . metoCLOPramide (REGLAN) injection  10 mg Intravenous Q12H  . mometasone-formoterol  2 puff Inhalation BID  . pantoprazole  40 mg Oral Q1200  . polyethylene glycol  17 g Oral BID  . scopolamine  1 patch Transdermal Q72H    Continuous Infusions: . sodium chloride 10 mL/hr at 05/16/14 0857  . Marland KitchenTPN (CLINIMIX-E) Adult     And  . fat emulsion  Jessaca Philippi Barnett RD, LDN Inpatient Clinical Dietitian Pager: [phone] After Hours Pager: [phone]  

## 2014-05-19 NOTE — Progress Notes (Signed)
Patient ID: Morgan [name], female   DOB: [birth year], 74 y.o.   MRN: [id number] BP 179/51 mmHg  Pulse 95  Temp(Src) 98.3 F (36.8 C) (Oral)  Resp 16  Ht 5' 4"  (1.626 m)  Wt 108.1 kg (238 lb 5.1 oz)  BMI 40.89 kg/m2  SpO2 95% Alert and oriented No bowel movement tna initiated No neurologic change. Blood sugars improved

## 2014-05-19 NOTE — Progress Notes (Signed)
UR complete.  Pelham Hennick RN, MSN 

## 2014-05-19 NOTE — Progress Notes (Signed)
RT received call from pts son about suctioning.  RT spoke with RN who states that she is breathing ok at this time.  RN to call RT if further assistance is needed.

## 2014-05-19 NOTE — Progress Notes (Signed)
NTS performed via right nare.  No complications noted.  Moderate amt of thick, tan/brown secretions returned.  Nare  Left intact.  Pt in NAD.

## 2014-05-19 NOTE — Progress Notes (Signed)
[facility] Progress Note  Subjective: She still has nausea. NG tube is still in place because of nausea and vomiting.. We still cannot feed her. She has not had significant nutrition in quite some time now. We have been unable to use the GI tract because of ongoing nausea and vomiting. Family very concerned that she has been unable to get any oral nutrition for days. Reglan was just started last night. It will take time to see if this improves her symptoms. She is still constipated.  Objective: Vital signs in last 24 hours: Temp:  [98.2 F (36.8 C)-98.8 F (37.1 C)] 98.4 F (36.9 C) (02/09 0915) Pulse Rate:  [83-105] 83 (02/09 0915) Resp:  [20-22] 20 (02/09 0915) BP: (146-185)/(40-77) 165/45 mmHg (02/09 0915) SpO2:  [93 %-98 %] 94 % (02/09 0915) Weight change:    PE:  Heart regular rhythm  Abdomen soft nontender  Lab Results: No results found for this or any previous visit (from the past 24 hour(s)).  Studies/Results: No results found.    Assessment: Nausea and vomiting.  Constipation  Plan: Continue NG tube suction.  Consult to pharmacy for TNA.  Continue Miralax    Kolleen Ochsner F 05/19/2014, 10:38 AM

## 2014-05-19 NOTE — Progress Notes (Signed)
NTS done.  Thick tan moderate amount of secretions.  No complications noted.

## 2014-05-20 LAB — MAGNESIUM: Magnesium: 2.1 mg/dL (ref 1.5–2.5)

## 2014-05-20 LAB — GLUCOSE, CAPILLARY
GLUCOSE-CAPILLARY: 143 mg/dL — AB (ref 70–99)
GLUCOSE-CAPILLARY: 178 mg/dL — AB (ref 70–99)
GLUCOSE-CAPILLARY: 187 mg/dL — AB (ref 70–99)
GLUCOSE-CAPILLARY: 199 mg/dL — AB (ref 70–99)
GLUCOSE-CAPILLARY: 213 mg/dL — AB (ref 70–99)
GLUCOSE-CAPILLARY: 225 mg/dL — AB (ref 70–99)
Glucose-Capillary: 211 mg/dL — ABNORMAL HIGH (ref 70–99)
Glucose-Capillary: 197 mg/dL — ABNORMAL HIGH (ref 70–99)
Glucose-Capillary: 211 mg/dL — ABNORMAL HIGH (ref 70–99)

## 2014-05-20 LAB — TRIGLYCERIDES: TRIGLYCERIDES: 86 mg/dL (ref <150)

## 2014-05-20 LAB — DIFFERENTIAL
BASOS PCT: 0 % (ref 0–1)
Basophils Absolute: 0 10*3/uL (ref 0.0–0.1)
EOS ABS: 0.1 10*3/uL (ref 0.0–0.7)
Eosinophils Relative: 1 % (ref 0–5)
LYMPHS ABS: 1.4 10*3/uL (ref 0.7–4.0)
Lymphocytes Relative: 9 % — ABNORMAL LOW (ref 12–46)
MONOS PCT: 5 % (ref 3–12)
Monocytes Absolute: 0.7 10*3/uL (ref 0.1–1.0)
NEUTROS ABS: 12.3 10*3/uL — AB (ref 1.7–7.7)
NEUTROS PCT: 85 % — AB (ref 43–77)

## 2014-05-20 LAB — COMPREHENSIVE METABOLIC PANEL
ALT: 46 U/L — ABNORMAL HIGH (ref 0–35)
ANION GAP: 7 (ref 5–15)
AST: 31 U/L (ref 0–37)
Albumin: 2.1 g/dL — ABNORMAL LOW (ref 3.5–5.2)
Alkaline Phosphatase: 167 U/L — ABNORMAL HIGH (ref 39–117)
BUN: 17 mg/dL (ref 6–23)
CHLORIDE: 99 mmol/L (ref 96–112)
CO2: 31 mmol/L (ref 19–32)
CREATININE: 0.99 mg/dL (ref 0.50–1.10)
Calcium: 7.9 mg/dL — ABNORMAL LOW (ref 8.4–10.5)
GFR calc Af Amer: 64 mL/min — ABNORMAL LOW (ref >90)
GFR calc non Af Amer: 55 mL/min — ABNORMAL LOW (ref >90)
Glucose, Bld: 232 mg/dL — ABNORMAL HIGH (ref 70–99)
Potassium: 3.7 mmol/L (ref 3.5–5.1)
Sodium: 137 mmol/L (ref 135–145)
Total Bilirubin: 1.2 mg/dL (ref 0.3–1.2)
Total Protein: 5.1 g/dL — ABNORMAL LOW (ref 6.0–8.3)

## 2014-05-20 LAB — CBC
HEMATOCRIT: 29 % — AB (ref 36.0–46.0)
HEMOGLOBIN: 9.6 g/dL — AB (ref 12.0–15.0)
MCH: 30.7 pg (ref 26.0–34.0)
MCHC: 33.1 g/dL (ref 30.0–36.0)
MCV: 92.7 fL (ref 78.0–100.0)
Platelets: 152 10*3/uL (ref 150–400)
RBC: 3.13 MIL/uL — ABNORMAL LOW (ref 3.87–5.11)
RDW: 13.6 % (ref 11.5–15.5)
WBC: 14.4 10*3/uL — ABNORMAL HIGH (ref 4.0–10.5)

## 2014-05-20 LAB — PREALBUMIN: Prealbumin: 12.1 mg/dL — ABNORMAL LOW (ref 17.0–34.0)

## 2014-05-20 LAB — PHOSPHORUS: PHOSPHORUS: 2.9 mg/dL (ref 2.3–4.6)

## 2014-05-20 MED ORDER — POTASSIUM PHOSPHATES 15 MMOLE/5ML IV SOLN
15.0000 mmol | Freq: Once | INTRAVENOUS | Status: AC
  Administered 2014-05-20: 15 mmol via INTRAVENOUS
  Filled 2014-05-20: qty 5

## 2014-05-20 MED ORDER — FAT EMULSION 20 % IV EMUL
250.0000 mL | INTRAVENOUS | Status: AC
  Administered 2014-05-20: 250 mL via INTRAVENOUS
  Filled 2014-05-20: qty 250

## 2014-05-20 MED ORDER — TRACE MINERALS CR-CU-F-FE-I-MN-MO-SE-ZN IV SOLN
INTRAVENOUS | Status: AC
  Administered 2014-05-20: 17:00:00 via INTRAVENOUS
  Filled 2014-05-20: qty 960

## 2014-05-20 MED ORDER — MAGNESIUM SULFATE IN D5W 10-5 MG/ML-% IV SOLN
1.0000 g | Freq: Once | INTRAVENOUS | Status: AC
  Administered 2014-05-20: 1 g via INTRAVENOUS
  Filled 2014-05-20: qty 100

## 2014-05-20 NOTE — Progress Notes (Signed)
I continue to follow pt's progress as we await medical readiness to determine rehab dispo. I spoke with son at bedside yesterday. [id number]

## 2014-05-20 NOTE — Progress Notes (Signed)
Occupational Therapy Treatment Patient Details Name: Morgan [name] MRN: [id number] DOB: [birth year] Today's Date: 05/20/2014    History of present illness pt presents with Craniotomy post Meningioma Resection, Seizures, and VDRF.     OT comments  Pt seen today for family education on PROM of UEs. Pt presents with gurgling, congested sound and had NG removed during positioning earlier today. Spoke with RN about contacting MD to get SLP eval orders. Daughter was supportive and participated in family education. Pt continues to be an excellent candidate for CIR. Continue with current plan of care.    Follow Up Recommendations  CIR;Supervision/Assistance - 24 hour    Equipment Recommendations  Other (comment) (TBD next venue)    Recommendations for Other Services      Precautions / Restrictions Precautions Precautions: Fall Restrictions Weight Bearing Restrictions: No       Mobility Bed Mobility Overal bed mobility: Needs Assistance;+2 for physical assistance Bed Mobility: Rolling Rolling: Supervision         General bed mobility comments: Pt able to use hand rails to roll herself for positioning of bed pan. Pt required +2 assist to scoot up in bed without use of Trendelenburg position.   Transfers                 General transfer comment: did not assess this session due to decr arousal         ADL Overall ADL's : Needs assistance/impaired                                       General ADL Comments: Pt sleeping and provided family education to daughter at bedside regarding gentle PROM of Bil UEs to promote ROM and strengthening. Pt hands with minimal/mild edema and daughter reports that they have been positioning on pillows for edema control. Pt lethargic but aroused during education session and participated in AAROM of Bil UEs including shoulder flexion, elbow flexion/extension, and wrist and hand movement.Pt requested to use bed pan and performed  bed mobility for positioning. Pt presents with coughing and inability to clear throat and "gurgly" sound. NG tube was pulled on accident today. RN reports attempt at a sip of Breeze and pt with gurgling, congested, coughing. Advised RN to contact MD for SLP eval.                 Cognition  Arousal/Alertness: Awake/Alert Behavior During Therapy: Flat affect (but making jokes) Overall Cognitive Status: Difficult to assess Area of Impairment: Attention;Following commands;Problem solving   Current Attention Level: Focused    Following Commands: Follows one step commands with increased time Safety/Judgement: Decreased awareness of safety;Decreased awareness of deficits Awareness: Emergent Problem Solving: Slow processing;Difficulty sequencing;Requires verbal cues;Decreased initiation General Comments: pt level of arousal decreased today; daughter reports pt did nto sleep much last night due to cough and congestion; pt following commands with incr time and max cueing      Exercises  Pt participated in AAROM of Bil UEs.            Pertinent Vitals/ Pain       Pain Assessment: No/denies pain         Frequency Min 3X/week     Progress Toward Goals  OT Goals(current goals can now be found in the care plan section)  Progress towards OT goals: Progressing toward goals  Acute Rehab OT Goals Patient Stated Goal: to  not have my blood drawn again OT Goal Formulation: With patient/family Time For Goal Achievement: 05/27/14 Potential to Achieve Goals: Good  Plan Discharge plan remains appropriate       End of Session     Activity Tolerance Patient limited by fatigue   Patient Left in bed;with call bell/phone within reach;with family/visitor present;with bed alarm set   Nurse Communication Other (comment) (pt on bed pan; need for SLP evaluation)        Time: [id number] OT Time Calculation (min): 26 min  Charges: OT General Charges $OT Visit: 1 Procedure OT  Treatments $Therapeutic Activity: 8-22 mins $Therapeutic Exercise: 8-22 mins  [name] 05/20/2014, 5:39 PM  Secundino Ginger Lynetta Mare, OTR/L Occupational Therapist [phone] (pager)

## 2014-05-20 NOTE — Progress Notes (Signed)
Vimpat Benefits check:  Patient's responsibility is $369.18 for a 30 day supply.  (Medication cost is $839.05, covered at 44%= $369.18)   VIMPAT 200 MG BID  COVER - YES  CO-PAY- 44 % FULL LEVEL TIER-4 DRUG PRIOR APPROVAL - YES  # [phone] PHARMACY - ANY RETAIL

## 2014-05-20 NOTE — Progress Notes (Signed)
Physical Therapy Treatment Patient Details Name: Morgan [name] MRN: [id number] DOB: 16-May-1940 Today's Date: 05/20/2014    History of Present Illness pt presents with Craniotomy post Meningioma Resection, Seizures, and VDRF.      PT Comments    Pt continues to be willing/motivated to participate in therapy but limited to EOB activity today due to lethargic state. Daughter reports pt did not sleep well last night due to coughing/congestion. Will cont to follow per POC and recommend CIR. Encouraged OOB with lift and nursing if more alert later today.   Follow Up Recommendations  CIR     Equipment Recommendations  Other (comment) (TBD)    Recommendations for Other Services Rehab consult     Precautions / Restrictions Precautions Precautions: Fall Restrictions Weight Bearing Restrictions: No    Mobility  Bed Mobility Overal bed mobility: +2 for physical assistance;Needs Assistance Bed Mobility: Rolling;Sidelying to Sit;Sit to Sidelying Rolling: Min assist Sidelying to sit: +2 for physical assistance;Mod assist     Sit to sidelying: Mod assist General bed mobility comments: pt using handrails with hand over hand (A) and max cueing; pt lethargic and requiring 2 person (A) for sidelying to sit; requiring max redirection/cueing to keep eyes open when sitting EOB: pt tolerated sitting ~10 min; required min (A) to tactile cueing and verbal cueing to remain in midline   Transfers                 General transfer comment: did not assess this session due to decr arousal   Ambulation/Gait                 Stairs            Wheelchair Mobility    Modified Rankin (Stroke Patients Only)       Balance Overall balance assessment: Needs assistance Sitting-balance support: Feet supported;No upper extremity supported;Single extremity supported;Bilateral upper extremity supported Sitting balance-Leahy Scale: Poor Sitting balance - Comments: pt with multiple  bouts of LOB; requiring min (A) to max multimodal cues for upright posture; pt maintaining fwd head and kyphotic posture while sitting EOB; pt very lethargic Postural control: Posterior lean;Left lateral lean                          Cognition Arousal/Alertness: Lethargic Behavior During Therapy: Flat affect Overall Cognitive Status: Difficult to assess Area of Impairment: Attention;Following commands;Problem solving   Current Attention Level: Focused   Following Commands: Follows one step commands with increased time Safety/Judgement: Decreased awareness of safety;Decreased awareness of deficits     General Comments: pt level of arousal decreased today; daughter reports pt did nto sleep much last night due to cough and congestion; pt following commands with incr time and max cueing    Exercises      General Comments General comments (skin integrity, edema, etc.): pt repositioned with heels floated to prevent sores ; pt able to perform minmal LAQ due to fatigue/lethargy      Pertinent Vitals/Pain Pain Assessment: No/denies pain    Home Living                      Prior Function            PT Goals (current goals can now be found in the care plan section) Acute Rehab PT Goals Patient Stated Goal: none stated today PT Goal Formulation: With patient Time For Goal Achievement: 05/26/14 Potential to Achieve Goals: Good Progress  towards PT goals: Progressing toward goals    Frequency  Min 3X/week    PT Plan Current plan remains appropriate    Co-evaluation             End of Session   Activity Tolerance: Patient limited by lethargy Patient left: in bed;with call bell/phone within reach;with family/visitor present     Time: 1110-1137 PT Time Calculation (min) (ACUTE ONLY): 27 min  Charges:  $Therapeutic Activity: 23-37 mins                    G CodesGustavus Bryant, [place]  [phone] 05/20/2014, 12:00 PM

## 2014-05-20 NOTE — Progress Notes (Signed)
Patient ID: Morgan [name], female   DOB: 1940/07/09, 74 y.o.   MRN: [id number] BP 163/44 mmHg  Pulse 76  Temp(Src) 99.6 F (37.6 C) (Oral)  Resp 18  Ht 5' 4"  (1.626 m)  Wt 100.3 kg (221 lb 1.9 oz)  BMI 37.94 kg/m2  SpO2 97% Alert and oriented x 4, speech is clear, fluent Ng out, looks much better. Still no bowel movement Will ask for speech consult tomorrow

## 2014-05-20 NOTE — Progress Notes (Signed)
Removed staples area to right frontal parietal approximated scant amt. Blood observed from site.

## 2014-05-20 NOTE — Progress Notes (Signed)
Pt did not tolerate po medication or juice well when administered.  Lungs auscultated, rhonchi.  Pt was able to cough alittle,  suctioned to clear.  Placed order for SLP consult. Md paged to notify.  Neuro intact.  No noted distress. Will continue to monitor.  Daughter at bedside.

## 2014-05-20 NOTE — Progress Notes (Signed)
No nausea or vomiting at this time. The NG tube came out a little while ago. We are going to try to give her medications by mouth. Continue current management and TNA.

## 2014-05-20 NOTE — Progress Notes (Signed)
During repositioning, pt NG tube came out. Pt noted slightly on her right side with HOB elevated. No noted distress. Noted small amount of blood from right nasal, applied pressure. Pt able to follow commands to cough to clear secretions was suctioned and followed by respiratory therapy.  GI Md notified, instructed to leave NG tube out and see if pt can tolerated po medication.  Daughter at bedside, made aware. No complications at this time. Will continue to monitor.

## 2014-05-20 NOTE — Progress Notes (Signed)
RT note: Placed (7.0) Nasal Airway to Left Nare to help minimize trauma/bleeding from Nasotracheal suctioning that is  being required multiple times t/o shift along with Chest Vest being applied to help loosen/clear secretions, lubricated with Surgilube prior to insertion, uneventful with no complicatiions/bleeding, pt. tolerating well with additional airways placed at bedside, daughter at bedside, RN made aware, RT to monitor.

## 2014-05-20 NOTE — Progress Notes (Signed)
NUTRITION FOLLOW UP  Intervention:   TPN per pharmacy Diet advancement per MD RD to continue to monitor  Continue monitoring magnesium, potassium, and phosphorus daily for at least 2 more days, MD to replete as needed, as pt is at risk for refeeding syndrome given inability to tolerate PO intake > 7 days.    Nutrition Dx:   Inadequate oral intake related to nausea and vomiting as evidenced by pt's report and 3 lb weight loss; ongoing  New Goal: Pt to meet >/= 90% of their estimated nutrition needs; unmet  Monitor:   PO intake, supplement acceptance, weight trend, labs  Assessment:   Pt admitted 1/24 with AMS, found to have seizures in the setting of meningioma. S/p resection 1/29 and remained intubated.   Pt is currently receiving Clinimix E 5/15 at 30 ml/hr with plans to advance to 40 ml/hr tonight (goal 83 ml/hr) + IVFE at 10 ml/hr. TPN being advanced slowly due to refeeding risk and uncontrolled blood sugar.  NG tube came out earlier today. Per RN, pt attempted to drink a small amount of juice today but, she did not tolerate it well- some juice came out pt's nose.  Pt's weight has dropped 17 lbs in the past week.    Labs: elevated glucose, low calcium; magnesium and phosphorus WNL  Height: Ht Readings from Last 1 Encounters:  05/08/14 5\' 4"  (1.626 m)    Weight Status:   Wt Readings from Last 1 Encounters:  05/19/14 221 lb 1.9 oz (100.3 kg)  05/12/14 238 lbs  Re-estimated needs:  Kcal: 1700-1950 Protein: 95-110 grams Fluid: 2.8 L/day  Skin: closed incision on head; +1 facial edema, +1 generalized edema  Diet Order: Diet NPO time specified TPN (CLINIMIX-E) Adult TPN (CLINIMIX-E) Adult   Intake/Output Summary (Last 24 hours) at 05/20/14 1631 Last data filed at 05/19/14 1859  Gross per 24 hour  Intake      0 ml  Output     10 ml  Net    -10 ml    Last BM: 2/6   Labs:   Recent Labs Lab 05/13/14 2108 05/19/14 1425 05/20/14 0512  NA 133* 137 137  K  3.8 3.1* 3.7  CL 103 98 99  CO2 22 29 31   BUN 40* 19 17  CREATININE 1.08 1.02 0.99  CALCIUM 8.1* 7.6* 7.9*  MG  --  1.6 2.1  PHOS  --  3.5 2.9  GLUCOSE 213* 142* 232*    CBG (last 3)   Recent Labs  05/20/14 0405 05/20/14 0903 05/20/14 1149  GLUCAP 197* 225* 199*    Scheduled Meds: . anastrozole  1 mg Oral Daily  . cloNIDine  0.3 mg Transdermal Weekly  . furosemide  40 mg Oral BID  . guaifenesin  400 mg Oral Q4H  . insulin aspart  0-20 Units Subcutaneous 6 times per day  . lacosamide (VIMPAT) IV  200 mg Intravenous Q12H  . levETIRAcetam  500 mg Intravenous 3 times per day  . levothyroxine  200 mcg Oral Once per day on Mon Tue Wed Thu Fri Sat  . levothyroxine  300 mcg Oral Once every 7 days  . Liraglutide  1.8 mg Subcutaneous Daily  . metoCLOPramide (REGLAN) injection  10 mg Intravenous Q12H  . mometasone-formoterol  2 puff Inhalation BID  . pantoprazole  40 mg Oral Q1200  . polyethylene glycol  17 g Oral BID  . scopolamine  1 patch Transdermal Q72H    Continuous Infusions: . sodium chloride 10  mL/hr at 05/16/14 0857  . Marland KitchenTPN (CLINIMIX-E) Adult 30 mL/hr at 05/19/14 1751   And  . fat emulsion 250 mL (05/19/14 1751)  . Marland KitchenTPN (CLINIMIX-E) Adult     And  . fat emulsion      Pryor Ochoa RD, LDN Inpatient Clinical Dietitian Pager: [phone] After Hours Pager: [phone]

## 2014-05-20 NOTE — Progress Notes (Addendum)
PARENTERAL NUTRITION CONSULT NOTE - FOLLOW UP  Pharmacy Consult:  TPN Indication:  Bowel rest  Allergies  Allergen Reactions  . Latex Rash  . Sulfa Antibiotics Other (See Comments)    Unknown allergic reaction  . Penicillins Rash    Patient Measurements: Height: '5\' 4"'  (162.6 cm) Weight: 221 lb 1.9 oz (100.3 kg) IBW/kg (Calculated) : 54.7  Vital Signs: Temp: 99.7 F (37.6 C) (02/10 0633) Temp Source: Oral (02/10 2130) BP: 184/67 mmHg (02/10 8657) Pulse Rate: 88 (02/10 0633) Intake/Output from previous day: 02/09 0701 - 02/10 0700 In: -  Out: 10 [Emesis/NG output:10]  Labs:  Recent Labs  05/20/14 0512  WBC 14.4*  HGB 9.6*  HCT 29.0*  PLT 152     Recent Labs  05/19/14 1425 05/20/14 0512  NA 137 137  K 3.1* 3.7  CL 98 99  CO2 29 31  GLUCOSE 142* 232*  BUN 19 17  CREATININE 1.02 0.99  CALCIUM 7.6* 7.9*  MG 1.6 2.1  PHOS 3.5 2.9  PROT  --  5.1*  ALBUMIN  --  2.1*  AST  --  31  ALT  --  46*  ALKPHOS  --  167*  BILITOT  --  1.2  TRIG  --  86   Estimated Creatinine Clearance: 58.2 mL/min (by C-G formula based on Cr of 0.99).    Recent Labs  05/19/14 2048 05/20/14 0011 05/20/14 0405  GLUCAP 148* 178* 197*     Insulin Requirements in the past 24 hours:  18 units resistant SSI + Victoza 1.8 mg + 10 units regular insulin in TPN  Assessment: 73 YOF presented on 05/03/14 with AMS and left-sided weakness. 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  Patient did not tolerate PO intake post-op due to persistent nausea and vomiting.  Unable to use GI tract per Gertie Fey and Pharmacy consulted to manage TPN for nutritional support.  She is at risk for refeeding syndrome.  GI: obesity / GERD.  Persistent N/V, constipation - NGT for decompression, Miralax, Reglan, multivitamin, PPI PO Endo: hypothyroidism on Synthroid.  DM2 - CBGs uncontrolled prior to TPN initiation, remains uncontrolled 148-232 (trending up) Lytes: all WNL,  still with concern of refeeding Renal: SCr down 0.99, CrCL 58 ml/min Pulm: COPD - stable on RA, has thick secretions - Dulera, guaifenesin Cards: HTN / HLD - BP elevated, HR mostly controlled - clonidine, Lasix Hepatobil: Lipase/amylase WNL.  LFTs WNL except mildly elevated alk phos and ALT.  TG WNL. Heme/Onc: hgb 9.6, plts WNL - anastrozole Neuro: depression / neuropathy.  New right frontal meningioma causing seizure, s/p resection on 1/29 - Vimpat, Keppra, scopolamine patch, received PRN Dilaudid 2/9 ID: afebrile, WBC decreasing - not on abx Best Practices: SCDs TPN Access: right IJ placed 05/17/14 TPN day#: 1 (2/9 >> )  Current Nutrition:  TPN  Nutritional Goals:  1700-1900 kCal, 95-110grams of protein per day   Plan:  - Increase Clinimix E 5/15 to 40 ml/hr (goal 83 ml/hr) + IVFE at 10 ml/hr.  Will advance slow given risk of refeeding and uncontrolled blood sugar. - Daily multivitamin and trace elements - Continue resistant SSI Q4H + Victoza + increase insulin in TPN to 20 units - KPhos 15 mmol IV x 1 - Mag sulfate 1gm IV x 1 - F/U with possibility of enteral nutrition - Consider resuming home antihypertensives for better BP control: lisinopril, Toprol, folodipine - F/U AM labs    Eyoel Throgmorton D. Mina Marble, PharmD, BCPS  Pager:  [phone] 05/20/2014, 8:10 AM

## 2014-05-20 NOTE — Progress Notes (Addendum)
Inpatient Diabetes Program Recommendations  AACE/ADA: New Consensus Statement on Inpatient Glycemic Control (2013)  Target Ranges:  Prepandial:   less than 140 mg/dL      Peak postprandial:   less than 180 mg/dL (1-2 hours)      Critically ill patients:  140 - 180 mg/dL   Reason for Visit:Hyperglycemia  Results for IREMIDE, BELLAIRE (MRN [id number]) as of 05/20/2014 15:16  Ref. Range 05/20/2014 00:11 05/20/2014 04:05 05/20/2014 09:03 05/20/2014 11:49  Glucose-Capillary Latest Range: 70-99 mg/dL 178 (H) 197 (H) 225 (H) 199 (H)  Results for ROMILLY, ALEXIOU (MRN [id number]) as of 05/20/2014 15:16  Ref. Range 05/20/2014 05:12  Glucose Latest Range: 70-99 mg/dL 232 (H)   Insulin in TPN increased to 20 units today. Pt on U-500 s/s at home.  Consider addition of Lantus 20 units QHS.  Will continue to follow.  Thank you. Lorenda Peck, RD, LDN, CDE Inpatient Diabetes Coordinator [phone]

## 2014-05-21 LAB — COMPREHENSIVE METABOLIC PANEL
ALT: 52 U/L — ABNORMAL HIGH (ref 0–35)
AST: 54 U/L — AB (ref 0–37)
Albumin: 1.9 g/dL — ABNORMAL LOW (ref 3.5–5.2)
Alkaline Phosphatase: 191 U/L — ABNORMAL HIGH (ref 39–117)
Anion gap: 3 — ABNORMAL LOW (ref 5–15)
BUN: 19 mg/dL (ref 6–23)
CALCIUM: 7.7 mg/dL — AB (ref 8.4–10.5)
CO2: 34 mmol/L — AB (ref 19–32)
CREATININE: 0.98 mg/dL (ref 0.50–1.10)
Chloride: 101 mmol/L (ref 96–112)
GFR calc Af Amer: 65 mL/min — ABNORMAL LOW (ref >90)
GFR, EST NON AFRICAN AMERICAN: 56 mL/min — AB (ref >90)
Glucose, Bld: 200 mg/dL — ABNORMAL HIGH (ref 70–99)
Potassium: 3.5 mmol/L (ref 3.5–5.1)
Sodium: 138 mmol/L (ref 135–145)
Total Bilirubin: 1 mg/dL (ref 0.3–1.2)
Total Protein: 5.1 g/dL — ABNORMAL LOW (ref 6.0–8.3)

## 2014-05-21 LAB — GLUCOSE, CAPILLARY
GLUCOSE-CAPILLARY: 182 mg/dL — AB (ref 70–99)
GLUCOSE-CAPILLARY: 165 mg/dL — AB (ref 70–99)
Glucose-Capillary: 160 mg/dL — ABNORMAL HIGH (ref 70–99)
Glucose-Capillary: 160 mg/dL — ABNORMAL HIGH (ref 70–99)
Glucose-Capillary: 168 mg/dL — ABNORMAL HIGH (ref 70–99)
Glucose-Capillary: 219 mg/dL — ABNORMAL HIGH (ref 70–99)

## 2014-05-21 LAB — MAGNESIUM: MAGNESIUM: 2.1 mg/dL (ref 1.5–2.5)

## 2014-05-21 LAB — PHOSPHORUS: Phosphorus: 3.4 mg/dL (ref 2.3–4.6)

## 2014-05-21 MED ORDER — TRACE MINERALS CR-CU-F-FE-I-MN-MO-SE-ZN IV SOLN
INTRAVENOUS | Status: AC
  Administered 2014-05-21: 18:00:00 via INTRAVENOUS
  Filled 2014-05-21: qty 960

## 2014-05-21 MED ORDER — POTASSIUM CHLORIDE 10 MEQ/50ML IV SOLN
10.0000 meq | INTRAVENOUS | Status: AC
  Administered 2014-05-21 (×4): 10 meq via INTRAVENOUS
  Filled 2014-05-21 (×4): qty 50

## 2014-05-21 MED ORDER — COLCHICINE 0.6 MG PO TABS
0.6000 mg | ORAL_TABLET | Freq: Two times a day (BID) | ORAL | Status: DC
  Administered 2014-05-22 – 2014-05-29 (×14): 0.6 mg via ORAL
  Filled 2014-05-21 (×14): qty 1

## 2014-05-21 MED ORDER — PANTOPRAZOLE SODIUM 40 MG IV SOLR
40.0000 mg | Freq: Two times a day (BID) | INTRAVENOUS | Status: DC
  Administered 2014-05-21 – 2014-05-29 (×16): 40 mg via INTRAVENOUS
  Filled 2014-05-21 (×16): qty 40

## 2014-05-21 MED ORDER — FAT EMULSION 20 % IV EMUL
250.0000 mL | INTRAVENOUS | Status: AC
  Administered 2014-05-21: 250 mL via INTRAVENOUS
  Filled 2014-05-21: qty 250

## 2014-05-21 NOTE — Progress Notes (Signed)
PARENTERAL NUTRITION CONSULT NOTE - FOLLOW UP  Pharmacy Consult:  TPN Indication:  Bowel rest  Allergies  Allergen Reactions  . Latex Rash  . Sulfa Antibiotics Other (See Comments)    Unknown allergic reaction  . Penicillins Rash    Patient Measurements: Height: 5\' 4"  (162.6 cm) Weight: 221 lb 1.9 oz (100.3 kg) IBW/kg (Calculated) : 54.7  Vital Signs: Temp: 99.7 F (37.6 C) (02/11 0652) Temp Source: Oral (02/11 0652) BP: 178/54 mmHg (02/11 0652) Pulse Rate: 78 (02/11 0652) Intake/Output from previous day: 02/10 0701 - 02/11 0700 In: -  Out: 200 [Urine:200]  Labs:  Recent Labs  05/20/14 0512  WBC 14.4*  HGB 9.6*  HCT 29.0*  PLT 152     Recent Labs  05/19/14 1425 05/20/14 0512 05/21/14 0430  NA 137 137 138  K 3.1* 3.7 3.5  CL 98 99 101  CO2 29 31 34*  GLUCOSE 142* 232* 200*  BUN 19 17 19   CREATININE 1.02 0.99 0.98  CALCIUM 7.6* 7.9* 7.7*  MG 1.6 2.1 2.1  PHOS 3.5 2.9 3.4  PROT  --  5.1* 5.1*  ALBUMIN  --  2.1* 1.9*  AST  --  31 54*  ALT  --  46* 52*  ALKPHOS  --  167* 191*  BILITOT  --  1.2 1.0  PREALBUMIN  --  12.1*  --   TRIG  --  86  --    Estimated Creatinine Clearance: 58.8 mL/min (by C-G formula based on Cr of 0.98).    Recent Labs  05/20/14 2017 05/21/14 0016 05/21/14 0404  GLUCAP 187* 168* 182*     Insulin Requirements in the past 24 hours:  30 units resistant SSI + Victoza 1.8 mg + 20 units regular insulin in TPN  Assessment: 73 YOF presented on 05/03/14 with AMS and left-sided weakness. 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  Patient did not tolerate PO intake post-op due to persistent nausea and vomiting.  Unable to use GI tract per Gertie Fey and Pharmacy consulted to manage TPN for nutritional support.  She is at risk for refeeding syndrome.  GI: obesity / GERD.  17 lb weight loss in past week; baseline prealbumin low at 12.1.  Persistent N/V, constipation - NGT removed but unable to  tolerate PO med.  Miralax, Reglan, multivitamin, PPI PO Endo: hypothyroidism on Synthroid.  DM2 on U-500 PTA.  CBGs uncontrolled prior to TPN initiation, remains uncontrolled despite increasing insulin in TPN (168-219) Lytes: K+ low normal, others WNL Renal: SCr stable, CrCL 59 ml/min Pulm: COPD - RA >> 2L [place], has thick secretions - Dulera, guaifenesin Cards: HTN / HLD - BP elevated, HR controlled - clonidine, Lasix Hepatobil: lipase / amylase / TG WNL.  LFTs mildly elevated. Heme/Onc: hgb 9.6, plts WNL - anastrozole Neuro: depression / neuropathy.  New right frontal meningioma causing seizure, s/p resection on 1/29 - Vimpat, Keppra, scopolamine patch, receiving PRN Dilaudid  ID: afebrile, WBC decreasing - not on abx Best Practices: SCDs TPN Access: right IJ placed 05/17/14 TPN day#: 2 (2/9 >> )  Current Nutrition:  TPN  Nutritional Goals:  1700-1900 kCal, 95-110grams of protein per day   Plan:  - Continue Clinimix E 5/15 at 40 ml/hr (goal 83 ml/hr) + IVFE at 10 ml/hr.  Plan to advance rate in AM if CBGs are acceptable. - Daily multivitamin and trace elements - Continue resistant SSI Q4H + Victoza + increase insulin in TPN to 40 units -  KCL x 4 runs - F/U with possibility of enteral nutrition - Consider starting IV Lopressor for better BP control (on felodipine, Toprol and lisinopril PTA) - F/U change PPI and Synthroid to IV (IV Synthroid dose should be half of PO dose) - F/U AM labs    Morgan [name] D. Morgan [name], PharmD, BCPS Pager:  [phone] 05/21/2014, 8:33 AM

## 2014-05-21 NOTE — Progress Notes (Signed)
Pt NTS by RT for moderate amount of thick tan secretions. Passed through right nare with no difficulty.

## 2014-05-21 NOTE — Progress Notes (Signed)
Patient ID: Morgan [name], female   DOB: Aug 31, 1940, 74 y.o.   MRN: [id number] BP 137/47 mmHg  Pulse 76  Temp(Src) 98.1 F (36.7 C) (Oral)  Resp 24  Ht 5' 4"  (1.626 m)  Wt 100.3 kg (221 lb 1.9 oz)  BMI 37.94 kg/m2  SpO2 98% Alert and oriented x 4, moving all extremities GI working hard to sort things out. Family has expressed interest in CT of abdomen, not sure if that will be at all useful at this time.  egd to be performed tomorrow.  Neuro is excellent. No seizures.

## 2014-05-21 NOTE — Evaluation (Signed)
Clinical/Bedside Swallow Evaluation Patient Details  Name: Morgan [name] MRN: [id number] Date of Birth: 1940/04/20  Today's Date: 05/21/2014 Time: SLP Start Time (ACUTE ONLY): 1112 SLP Stop Time (ACUTE ONLY): 1156 SLP Time Calculation (min) (ACUTE ONLY): 44 min  Past Medical History:  Past Medical History  Diagnosis Date  . Diabetes mellitus   . Obesity   . Diabetes type 2, uncontrolled   . Hypoglycemia associated with diabetes   . Edema of both legs   . Hypertension   . Combined hyperlipidemia   . Acquired autoimmune hypothyroidism   . Thyroiditis, autoimmune   . Abnormal liver function tests   . Sleep apnea, obstructive   . History of gastroesophageal reflux (GERD)   . Depression   . DM neuropathy with neurologic complication   . COPD with acute exacerbation   . Goiter   . Fatigue   . Vertigo   . Type II diabetes mellitus with peripheral angiopathy   . Gout   . Pallor    Past Surgical History:  Past Surgical History  Procedure Laterality Date  . Laparoscopic gastric banding    . Back surgery    . Craniotomy Right 05/08/2014    Procedure: CRANIOTOMY FOR MENINGIOMA;  Surgeon: Ashok Pall, MD;  Location: [facility];  Service: Neurosurgery;  Laterality: Right;  Right Craniotomy for meningioma   HPI:  pt presents with Craniotomy post Meningioma Resection, Seizures, and VDRF.    Assessment / Plan / Recommendation Clinical Impression  Pt was lethargic with wet baseline vocal quality. Cough was weak and strong, although ultimately returned vocal quality to baseline. Ice chips and small, single sips of water elicited facial grimmacing and intermittent immediate coughing, concerning for airway compromise. Per Dr. Penelope Coop, plan is now for EGD on next date to look for potential source of persistent nausea. Recommend continued alternative means of nutrition with emphasis on thorough oral care and small amount of ice chips for now. After EGD will reassess swallowing function to  determine if objective testing is warranted to determine least restrictive diet. Education was provided to patient and family regarding current recommendations and plan, including emphasis on good oral care.     Aspiration Risk  Severe    Diet Recommendation Ice chips PRN after oral care   Medication Administration: Via alternative means    Other  Recommendations     Follow Up Recommendations  Inpatient Rehab;24 hour supervision/assistance    Frequency and Duration min 3x week  1 week   Pertinent Vitals/Pain n/a    SLP Swallow Goals     Swallow Study Prior Functional Status       General HPI: pt presents with Craniotomy post Meningioma Resection, Seizures, and VDRF.  Type of Study: Bedside swallow evaluation Previous Swallow Assessment: BSE 05/04/14 without signs of dysphagia Diet Prior to this Study: NPO;TNA Temperature Spikes Noted: Yes (low grade) Respiratory Status: Nasal cannula History of Recent Intubation: Yes Length of Intubations (days): 4 days Date extubated: 05/11/14 Behavior/Cognition: Cooperative;Requires cueing;Lethargic Oral Cavity - Dentition: Adequate natural dentition Self-Feeding Abilities: Able to feed self Patient Positioning: Upright in bed Baseline Vocal Quality: Wet Volitional Cough: Weak;Congested Volitional Swallow: Able to elicit    Oral/Motor/Sensory Function     Ice Chips Ice chips: Impaired Presentation: Spoon Pharyngeal Phase Impairments: Cough - Immediate   Thin Liquid Thin Liquid: Impaired Presentation: Self Fed;Straw Pharyngeal  Phase Impairments: Cough - Immediate    Nectar Thick Nectar Thick Liquid: Not tested   Honey Thick Honey Thick Liquid:  Not tested   Puree Puree: Not tested   Solid   GO    Solid: Not tested       Germain Osgood, M.A. CCC-SLP [phone]  Germain Osgood 05/21/2014,1:44 PM

## 2014-05-21 NOTE — Progress Notes (Signed)
Unable to administer medication as pt is unable to tolerate. All other medication administered. Md notified. Speech therapist stated that pt is having trouble swallowing but can have ice chips after mouth care. HOB elevated. Mouth care provided, her son gave her ice chips. Pt is able to cough and suctioning provided. Family remains at bedside. Will continue to monitor.

## 2014-05-21 NOTE — Progress Notes (Signed)
[facility] Progress Note  Subjective: The patient still has nausea. The NG tube is out, but she is not vomiting. Speech therapy has seen her and gave her some water to try and it went down okay. The patient complains that she is having trouble swallowing but she thinks is because her throat is sore from the NG tube having been in. She has still not had a bowel movement.  Objective: Vital signs in last 24 hours: Temp:  [97.9 F (36.6 C)-99.7 F (37.6 C)] 97.9 F (36.6 C) (02/11 1044) Pulse Rate:  [66-81] 76 (02/11 1044) Resp:  [18-24] 20 (02/11 1044) BP: (137-178)/(44-60) 174/60 mmHg (02/11 1044) SpO2:  [94 %-98 %] 98 % (02/11 1044) Weight change:    PE:  She does not appear in any acute distress  Heart regular rhythm no murmurs  Lungs clear  Abdomen: Bowel sounds are present, soft, not distended, not tender  Lab Results: Results for orders placed or performed during the hospital encounter of 05/03/14 (from the past 24 hour(s))  Glucose, capillary     Status: Abnormal   Collection Time: 05/20/14  4:21 PM  Result Value Ref Range   Glucose-Capillary 211 (H) 70 - 99 mg/dL  Glucose, capillary     Status: Abnormal   Collection Time: 05/20/14  8:17 PM  Result Value Ref Range   Glucose-Capillary 187 (H) 70 - 99 mg/dL   Comment 1 Notify RN    Comment 2 Documented in Char   Glucose, capillary     Status: Abnormal   Collection Time: 05/21/14 12:16 AM  Result Value Ref Range   Glucose-Capillary 168 (H) 70 - 99 mg/dL   Comment 1 Documented in Char    Comment 2 Repeat Test   Glucose, capillary     Status: Abnormal   Collection Time: 05/21/14  4:04 AM  Result Value Ref Range   Glucose-Capillary 182 (H) 70 - 99 mg/dL   Comment 1 Notify RN    Comment 2 Documented in Char   Comprehensive metabolic panel     Status: Abnormal   Collection Time: 05/21/14  4:30 AM  Result Value Ref Range   Sodium 138 135 - 145 mmol/L   Potassium 3.5 3.5 - 5.1 mmol/L   Chloride 101 96 -  112 mmol/L   CO2 34 (H) 19 - 32 mmol/L   Glucose, Bld 200 (H) 70 - 99 mg/dL   BUN 19 6 - 23 mg/dL   Creatinine, Ser 0.98 0.50 - 1.10 mg/dL   Calcium 7.7 (L) 8.4 - 10.5 mg/dL   Total Protein 5.1 (L) 6.0 - 8.3 g/dL   Albumin 1.9 (L) 3.5 - 5.2 g/dL   AST 54 (H) 0 - 37 U/L   ALT 52 (H) 0 - 35 U/L   Alkaline Phosphatase 191 (H) 39 - 117 U/L   Total Bilirubin 1.0 0.3 - 1.2 mg/dL   GFR calc non Af Amer 56 (L) >90 mL/min   GFR calc Af Amer 65 (L) >90 mL/min   Anion gap 3 (L) 5 - 15  Magnesium     Status: None   Collection Time: 05/21/14  4:30 AM  Result Value Ref Range   Magnesium 2.1 1.5 - 2.5 mg/dL  Phosphorus     Status: None   Collection Time: 05/21/14  4:30 AM  Result Value Ref Range   Phosphorus 3.4 2.3 - 4.6 mg/dL  Glucose, capillary     Status: Abnormal   Collection Time: 05/21/14  8:14 AM  Result Value Ref Range   Glucose-Capillary 219 (H) 70 - 99 mg/dL   Comment 1 Notify RN    Comment 2 Documented in Char   Glucose, capillary     Status: Abnormal   Collection Time: 05/21/14 11:33 AM  Result Value Ref Range   Glucose-Capillary 160 (H) 70 - 99 mg/dL   Comment 1 Notify RN    Comment 2 Documented in Char     Studies/Results: No results found.    Assessment: Persistent nausea  Dysphagia but the patient says she thinks this is because her throat is sore from the NG tube being in place for a few days.  Constipation. This could certainly be explained because she is immobile in bed, on narcotic analgesics, and has not been eating.  Plan: Continue TNA. I will schedule her for an EGD tomorrow to see if there is anything significant going on in the upper GI tract to explain her persistent nausea. Plan discussed with her son.    Wonda Horner 05/21/2014, 12:51 PM

## 2014-05-22 ENCOUNTER — Inpatient Hospital Stay (HOSPITAL_COMMUNITY): Payer: Medicare Other | Admitting: Certified Registered"

## 2014-05-22 ENCOUNTER — Encounter (HOSPITAL_COMMUNITY)
Admission: EM | Disposition: A | Discharge status: Rehabilitation Facility | Payer: Self-pay | Source: Home / Self Care | Attending: Neurosurgery

## 2014-05-22 ENCOUNTER — Encounter (HOSPITAL_COMMUNITY): Payer: Self-pay | Admitting: *Deleted

## 2014-05-22 HISTORY — PX: ESOPHAGOGASTRODUODENOSCOPY (EGD) WITH PROPOFOL: SHX5813

## 2014-05-22 LAB — GLUCOSE, CAPILLARY
GLUCOSE-CAPILLARY: 134 mg/dL — AB (ref 70–99)
GLUCOSE-CAPILLARY: 154 mg/dL — AB (ref 70–99)
GLUCOSE-CAPILLARY: 186 mg/dL — AB (ref 70–99)
Glucose-Capillary: 117 mg/dL — ABNORMAL HIGH (ref 70–99)
Glucose-Capillary: 198 mg/dL — ABNORMAL HIGH (ref 70–99)
Glucose-Capillary: 236 mg/dL — ABNORMAL HIGH (ref 70–99)
Glucose-Capillary: 247 mg/dL — ABNORMAL HIGH (ref 70–99)
Glucose-Capillary: 214 mg/dL — ABNORMAL HIGH (ref 70–99)

## 2014-05-22 LAB — BASIC METABOLIC PANEL
ANION GAP: 6 (ref 5–15)
BUN: 15 mg/dL (ref 6–23)
CO2: 30 mmol/L (ref 19–32)
CREATININE: 0.78 mg/dL (ref 0.50–1.10)
Calcium: 8 mg/dL — ABNORMAL LOW (ref 8.4–10.5)
Chloride: 100 mmol/L (ref 96–112)
GFR calc Af Amer: >90 mL/min (ref >90)
GFR calc non Af Amer: 81 mL/min — ABNORMAL LOW (ref >90)
GLUCOSE: 131 mg/dL — AB (ref 70–99)
Potassium: 3.5 mmol/L (ref 3.5–5.1)
Sodium: 136 mmol/L (ref 135–145)

## 2014-05-22 LAB — MAGNESIUM: MAGNESIUM: 1.9 mg/dL (ref 1.5–2.5)

## 2014-05-22 LAB — PHOSPHORUS: Phosphorus: 3.3 mg/dL (ref 2.3–4.6)

## 2014-05-22 SURGERY — ESOPHAGOGASTRODUODENOSCOPY (EGD) WITH PROPOFOL
Anesthesia: Monitor Anesthesia Care

## 2014-05-22 MED ORDER — PROPOFOL 10 MG/ML IV BOLUS
INTRAVENOUS | Status: DC | PRN
  Administered 2014-05-22: 10 mg via INTRAVENOUS
  Administered 2014-05-22: 40 mg via INTRAVENOUS

## 2014-05-22 MED ORDER — CODEINE PHOSPHATE 30 MG/ML IJ SOLN: 30.0000 mg | INTRAMUSCULAR | Status: DC | PRN

## 2014-05-22 MED ORDER — FAT EMULSION 20 % IV EMUL
250.0000 mL | INTRAVENOUS | Status: AC
  Administered 2014-05-22: 250 mL via INTRAVENOUS
  Filled 2014-05-22: qty 250

## 2014-05-22 MED ORDER — HEPARIN SODIUM (PORCINE) 5000 UNIT/ML IJ SOLN
5000.0000 [IU] | Freq: Three times a day (TID) | INTRAMUSCULAR | Status: DC
  Administered 2014-05-22 – 2014-05-29 (×22): 5000 [IU] via SUBCUTANEOUS
  Filled 2014-05-22 (×22): qty 1

## 2014-05-22 MED ORDER — BUTAMBEN-TETRACAINE-BENZOCAINE 2-2-14 % EX AERO
INHALATION_SPRAY | CUTANEOUS | Status: DC | PRN
  Administered 2014-05-22: 2 via TOPICAL

## 2014-05-22 MED ORDER — TRACE MINERALS CR-CU-F-FE-I-MN-MO-SE-ZN IV SOLN
INTRAVENOUS | Status: AC
  Administered 2014-05-22: 18:00:00 via INTRAVENOUS
  Filled 2014-05-22: qty 1440

## 2014-05-22 MED ORDER — LIRAGLUTIDE 18 MG/3ML ~~LOC~~ SOPN
1.8000 mg | PEN_INJECTOR | Freq: Every day | SUBCUTANEOUS | Status: DC
  Administered 2014-05-22 – 2014-05-28 (×6): 1.8 mg via SUBCUTANEOUS

## 2014-05-22 MED ORDER — RESOURCE THICKENUP CLEAR PO POWD
ORAL | Status: DC | PRN
  Filled 2014-05-22: qty 125

## 2014-05-22 MED ORDER — ARTIFICIAL TEARS OP OINT
TOPICAL_OINTMENT | OPHTHALMIC | Status: DC | PRN
  Administered 2014-05-22 (×2): via OPHTHALMIC
  Administered 2014-05-25: 1 via OPHTHALMIC
  Filled 2014-05-22: qty 3.5

## 2014-05-22 NOTE — Transfer of Care (Signed)
Immediate Anesthesia Transfer of Care Note  Patient: Morgan [name]  Procedure(s) Performed: Procedure(s): ESOPHAGOGASTRODUODENOSCOPY (EGD) WITH PROPOFOL (N/A)  Patient Location: Endoscopy Unit  Anesthesia Type:MAC  Level of Consciousness: awake  Airway & Oxygen Therapy: Patient Spontanous Breathing and Patient connected to nasal cannula oxygen  Post-op Assessment: Report given to RN, Post -op Vital signs reviewed and stable and Patient moving all extremities  Post vital signs: Reviewed and stable  Last Vitals:  Filed Vitals:   05/22/14 0928  BP: 162/82  Pulse: 110  Temp: 36.5 C  Resp: 18    Complications: No apparent anesthesia complications

## 2014-05-22 NOTE — Anesthesia Preprocedure Evaluation (Addendum)
Anesthesia Evaluation  Patient identified by MRN, date of birth, ID bandGeneral Assessment Comment:Somnolent  Reviewed: Allergy & Precautions, H&P , NPO status , Patient's Chart, lab work & pertinent test results, reviewed documented beta blocker date and time   Airway Mallampati: III  TM Distance: >3 FB Neck ROM: Full    Dental no notable dental hx. (+) Teeth Intact, Dental Advisory Given   Pulmonary sleep apnea , COPD breath sounds clear to auscultation  Pulmonary exam normal       Cardiovascular hypertension, Pt. on medications and Pt. on home beta blockers + Peripheral Vascular Disease Rhythm:Regular Rate:Normal     Neuro/Psych Depression negative neurological ROS  negative psych ROS   GI/Hepatic Neg liver ROS, GERD-  Medicated and Controlled,  Endo/Other  diabetes, Type 2, Insulin Dependent, Oral Hypoglycemic AgentsHypothyroidism Morbid obesity  Renal/GU negative Renal ROS  negative genitourinary   Musculoskeletal   Abdominal   Peds  Hematology negative hematology ROS (+)   Anesthesia Other Findings   Reproductive/Obstetrics negative OB ROS                            Anesthesia Physical Anesthesia Plan  ASA: III  Anesthesia Plan: MAC   Post-op Pain Management:    Induction: Intravenous  Airway Management Planned: Nasal Cannula  Additional Equipment:   Intra-op Plan:   Post-operative Plan:   Informed Consent: I have reviewed the patients History and Physical, chart, labs and discussed the procedure including the risks, benefits and alternatives for the proposed anesthesia with the patient or authorized representative who has indicated his/her understanding and acceptance.   Dental advisory given  Plan Discussed with: CRNA  Anesthesia Plan Comments:         Anesthesia Quick Evaluation

## 2014-05-22 NOTE — Progress Notes (Signed)
PT Cancellation Note  Elna, Radovich  Patient down for EGD at this time. Will follow up as appropriate and as schedule allows. PT will continue to follow up with patient as appropriate to address PT goals.   05/22/2014 Jacqualyn Posey PTA [phone] pager [phone] office

## 2014-05-22 NOTE — Op Note (Signed)
[facility] [place], [address]   ENDOSCOPY PROCEDURE REPORT  PATIENT: Morgan [name], Morgan [name]  MR#: [id number] BIRTHDATE: 1941/03/26 , 73  yrs. old GENDER: female ENDOSCOPIST: Acquanetta Sit, MD REFERRED BY: PROCEDURE DATE:  06-17-2014 PROCEDURE:  EGD ASA CLASS:     3 INDICATIONS:  nausea, dysphagia MEDICATIONS: sedation per anesthesia TOPICAL ANESTHETIC:  DESCRIPTION OF PROCEDURE: After the risks benefits and alternatives of the procedure were thoroughly explained, informed consent was obtained.  The PENTAX GASTOROSCOPE [id number] endoscope was introduced through the mouth and advanced to the second portion of the duodenum , Without limitations.  The instrument was slowly withdrawn as the mucosa was fully examined.  Findings:  Upon initial passage of the scope it appears that the oral pharyngeal mucosa is somewhat edematous.  Esophagus: Normal. No evidence of esophagitis, ulcers, tumors, or strictures.  Stomach: Normal  Duodenum: Normal    The scope was then withdrawn from the patient and the procedure completed.  COMPLICATIONS: There were no immediate complications.  ENDOSCOPIC IMPRESSION:normal EGD. Somewhat edematous oral pharyngeal mucosa upon initial passage of the scope was seen.   RECOMMENDATIONS:continue supportive care. If dysphagia persists I suspect this is an oral pharyngeal dysphagia and would have speech therapy do a modified barium swallow. [Recommendations]  REPEAT EXAM: eSignedAcquanetta Sit, MD Jun 17, 2014 10:14 AM    CC:  CPT CODES: ICD CODES:  The ICD and CPT codes recommended by this software are interpretations from the data that the clinical staff has captured with the software.  The verification of the translation of this report to the ICD and CPT codes and modifiers is the sole responsibility of the health care institution and practicing physician where this report was generated.  [employer] will not be held responsible for the validity of the ICD and CPT codes included on this report.  AMA assumes no liability for data contained or not contained herein. CPT is a Designer, television/film set of the [employer].  PATIENT NAME:  [name], [name] MR#: [id number]

## 2014-05-22 NOTE — Progress Notes (Signed)
Pt refusingto wear SCDs due to gout pain in legs.  Nurse educated pt and pt's family member of importance. Pt verbally acknowledged understanding. Nurse notified Dr. Christella Noa via office that pt refuses SCDs due to gout and pt requesting eye drops for lubrication. Will monitor for return call/orders/   Angeline Slim I 05/22/2014 11:16 AM

## 2014-05-22 NOTE — Progress Notes (Signed)
OT Cancellation Note  Patient Details Name: Morgan [name] MRN: [id number] DOB: 15-Sep-1940   Cancelled Treatment:    Reason Eval/Treat Not Completed: Patient at procedure or test/ unavailable. Pt off floor for EGD. OT will continue to follow up with pt as appropriate to address OT goals.   [name] 05/22/2014, 9:19 AM   Cyndie Chime, OTR/L Occupational Therapist [phone] (pager)

## 2014-05-22 NOTE — Progress Notes (Signed)
Pt unavailable in endo.  CPT held at this time.  Family aware.

## 2014-05-22 NOTE — Progress Notes (Signed)
Occupational Therapy Treatment Patient Details Name: Morgan [name] MRN: [id number] DOB: [birth year] Today's Date: 05/22/2014    History of present illness pt presents with Craniotomy post Meningioma Resection, Seizures, and VDRF.     OT comments  Pt seen today for strengthening and to increase activity tolerance to promote functional mobility and independence with ADLs. Pt participated in session with VC's to keep eyes open. She required min A +2 to stand with use of stedy and performed x3 to provide LE strengthening and activity tolerance. Pt will continue to benefit from acute OT to address ADLs and functional mobility and is an excellent CIR candidate.    Follow Up Recommendations  CIR;Supervision/Assistance - 24 hour    Equipment Recommendations  Other (comment) (TBD next venue)    Recommendations for Other Services      Precautions / Restrictions Precautions Precautions: Fall Restrictions Weight Bearing Restrictions: No       Mobility Bed Mobility Overal bed mobility: Needs Assistance;+2 for physical assistance Bed Mobility: Rolling;Sidelying to Sit Rolling: Supervision Sidelying to sit: Min assist       General bed mobility comments: Pt. with heavy reliance on hand rails to pull herslef upright, cues for slidign LEs off the bed; min A to bring trunk further upright than what she did. Min  A bring hips strainght at EOB.  Transfers Overall transfer level: Needs assistance Equipment used: Ambulation equipment used [name] Transfers: Sit to/from Stand Sit to Stand: Min assist;+2 physical assistance         General transfer comment: Performed sit<>stand x3 using stedy. Pt requires VC's to pull with UEs on bar and to bring hips forward. Pt maintains forward flexed posture.         ADL Overall ADL's : Needs assistance/impaired                         Toilet Transfer: Minimal assistance;+2 for physical assistance (with use of Stedy)   Toileting-  Clothing Manipulation and Hygiene: Total assistance;+2 for physical assistance;Sit to/from stand (with use of stedy and min A to stand)         General ADL Comments: Pt opening eyes more this session however continues to keep them shut unless VC's to open. Pt is motivated to get OOB in order to do swallowing study as she would like to eat. Pt participated in sitting EOB and sit<>stand x3 with use of stedy and VC's. Pt fatigues quickly and was positioned in recliner chair. Pt performed shoulder flexion x5 and was using her squeeze ball.                 Cognition  Arousal/Alertness: Awake/Alert Behavior During Therapy: Flat affect Overall Cognitive Status: Difficult to assess Area of Impairment: Attention;Following commands   Current Attention Level: Focused    Following Commands: Follows one step commands with increased time                         Pertinent Vitals/ Pain       Pain Assessment: 0-10 Pain Score: 4  Pain Location: RLE gout Pain Descriptors / Indicators: Aching Pain Intervention(s): Limited activity within patient's tolerance;Monitored during session;Repositioned         Frequency Min 3X/week     Progress Toward Goals  OT Goals(current goals can now be found in the care plan section)  Progress towards OT goals: Progressing toward goals  Acute Rehab OT Goals Patient Stated Goal:  to eat OT Goal Formulation: With patient/family Time For Goal Achievement: 05/27/14 Potential to Achieve Goals: Good  Plan Discharge plan remains appropriate    Co-evaluation    PT/OT/SLP Co-Evaluation/Treatment: Yes Reason for Co-Treatment: Complexity of the patient's impairments (multi-system involvement);For patient/therapist safety   OT goals addressed during session: ADL's and self-care      End of Session Equipment Utilized During Treatment: Gait belt;Other (comment) (stedy)   Activity Tolerance Patient limited by fatigue   Patient Left in chair;with call  bell/phone within reach;with family/visitor present   Nurse Communication          Time: 1325-1350 OT Time Calculation (min): 25 min  Charges: OT General Charges $OT Visit: 1 Procedure OT Treatments $Self Care/Home Management : 8-22 mins  Juluis Rainier 05/22/2014, 7:09 PM  Cyndie Chime, OTR/L Occupational Therapist [phone] (pager)

## 2014-05-22 NOTE — Progress Notes (Signed)
Speech Language Pathology Treatment: Dysphagia  Patient Details Name: Morgan [name] MRN: [id number] DOB: December 26, 1940 Today's Date: 05/22/2014 Time: [id number] SLP Time Calculation (min) (ACUTE ONLY): 33 min  Assessment / Plan / Recommendation Clinical Impression  All liquid consistencies (thin, nectar, and honey) continue to elicit s/s of aspiration, including immediate coughing and intermittent throat clearing. No overt signs of aspiration are observed with regular textures or pureed solids, although patient is lethargic and requires additional time for mastication. Would recommend initiating Dys 2 diet and pudding thick liquids based on clinical presentation, with MBS on next date for better assessment of oropharyngeal swallow function.   HPI HPI: pt presents with Craniotomy post Meningioma Resection, Seizures, and VDRF.    Pertinent Vitals Pain Assessment: Faces Pain Score: 4  Faces Pain Scale: No hurt Pain Location: bilateral legs feeling like gout Pain Descriptors / Indicators: Aching Pain Intervention(s): Monitored during session;Repositioned  SLP Plan  MBS    Recommendations Diet recommendations: Dysphagia 2 (fine chop);Pudding-thick liquid Liquids provided via: Teaspoon Medication Administration: Crushed with puree Supervision: Patient able to self feed;Full supervision/cueing for compensatory strategies Compensations: Slow rate;Small sips/bites Postural Changes and/or Swallow Maneuvers: Upright 30-60 min after meal;Seated upright 90 degrees              Oral Care Recommendations: Oral care BID Follow up Recommendations: Inpatient Rehab;24 hour supervision/assistance Plan: MBS    GO     Germain Osgood, M.A. CCC-SLP [phone]  Germain Osgood 05/22/2014, 2:57 PM

## 2014-05-22 NOTE — Progress Notes (Signed)
Physical Therapy Treatment Patient Details Name: Morgan [name] MRN: [id number] DOB: 28-Jul-1940 Today's Date: 05/22/2014    History of Present Illness pt presents with Craniotomy post Meningioma Resection, Seizures, and VDRF.      PT Comments    Pt. Utilized the [facility] very well. T. Would benefit from more posture training and strengthening next session. Up in chair increases her arousal and transferring is good for strengthening. Accomplished three sit to stands in the [place]. Pt. A good candidate for CIR for ongoing therapy needs to work on the above deficits.   Follow Up Recommendations  CIR     Equipment Recommendations  Other (comment)    Recommendations for Other Services Rehab consult     Precautions / Restrictions Precautions Precautions: Fall Restrictions Weight Bearing Restrictions: No    Mobility  Bed Mobility Overal bed mobility: Needs Assistance;+2 for physical assistance Bed Mobility: Rolling;Sidelying to Sit Rolling: Supervision Sidelying to sit: Min assist       General bed mobility comments: Pt. with heavy reliance on hand rails to pull herslef upright, cues for slidign LEs off the bed; min A to bring trunk further upright than what she did. Min  A bring hips strainght at EOB.  Transfers Overall transfer level: Needs assistance Equipment used: Ambulation equipment used Transfers: Sit to/from Stand Sit to Stand: Min assist;+2 physical assistance            Ambulation/Gait                 Stairs            Wheelchair Mobility    Modified Rankin (Stroke Patients Only)       Balance Overall balance assessment: Needs assistance Sitting-balance support: Feet supported;Bilateral upper extremity supported Sitting balance-Leahy Scale: Poor Sitting balance - Comments: needed tactile and vc's to maintain upright posture and lift head, open eyes. pt. lethargic Postural control: Posterior lean;Left lateral lean                          Cognition Arousal/Alertness: Lethargic Behavior During Therapy: Flat affect Overall Cognitive Status: Difficult to assess Area of Impairment: Attention;Following commands   Current Attention Level: Focused   Following Commands: Follows one step commands with increased time            Exercises      General Comments        Pertinent Vitals/Pain Pain Score: 4  Pain Location: bilateral legs feeling like gout Pain Descriptors / Indicators: Aching Pain Intervention(s): Monitored during session;Repositioned    Home Living                      Prior Function            PT Goals (current goals can now be found in the care plan section) Progress towards PT goals: Progressing toward goals    Frequency  Min 3X/week    PT Plan Current plan remains appropriate    Co-evaluation PT/OT/SLP Co-Evaluation/Treatment: Yes Reason for Co-Treatment: Complexity of the patient's impairments (multi-system involvement);For patient/therapist safety PT goals addressed during session: Mobility/safety with mobility       End of Session Equipment Utilized During Treatment: Gait belt Activity Tolerance: Patient limited by lethargy Patient left: in chair;with call bell/phone within reach;with family/visitor present     Time: 1325-1350 PT Time Calculation (min) (ACUTE ONLY): 25 min  Charges:  G Codes:      Jodi Geralds, SPTA 05/22/2014, 2:28 PM

## 2014-05-22 NOTE — Progress Notes (Signed)
UR complete.  Berish Bohman RN, MSN 

## 2014-05-22 NOTE — Progress Notes (Signed)
PARENTERAL NUTRITION CONSULT NOTE - FOLLOW UP  Pharmacy Consult:  TPN Indication:  Bowel rest  Allergies  Allergen Reactions  . Latex Rash  . Sulfa Antibiotics Other (See Comments)    Unknown allergic reaction  . Penicillins Rash    Patient Measurements: Height: 5\' 4"  (162.6 cm) Weight: 221 lb 1.9 oz (100.3 kg) IBW/kg (Calculated) : 54.7  Vital Signs: Temp: 99.1 F (37.3 C) (02/12 0700) Temp Source: Oral (02/12 0700) BP: 140/62 mmHg (02/12 0700) Pulse Rate: 88 (02/12 0700) Intake/Output from previous day:    Labs:  Recent Labs  05/20/14 0512  WBC 14.4*  HGB 9.6*  HCT 29.0*  PLT 152     Recent Labs  05/20/14 0512 05/21/14 0430 05/22/14 0555  NA 137 138 136  K 3.7 3.5 3.5  CL 99 101 100  CO2 31 34* 30  GLUCOSE 232* 200* 131*  BUN 17 19 15   CREATININE 0.99 0.98 0.78  CALCIUM 7.9* 7.7* 8.0*  MG 2.1 2.1 1.9  PHOS 2.9 3.4 3.3  PROT 5.1* 5.1*  --   ALBUMIN 2.1* 1.9*  --   AST 31 54*  --   ALT 46* 52*  --   ALKPHOS 167* 191*  --   BILITOT 1.2 1.0  --   PREALBUMIN 12.1*  --   --   TRIG 86  --   --    Estimated Creatinine Clearance: 72.1 mL/min (by C-G formula based on Cr of 0.78).    Recent Labs  05/21/14 2031 05/22/14 0028 05/22/14 0414  GLUCAP 160* 134* 117*     Insulin Requirements in the past 24 hours:  22 units resistant SSI + Victoza 1.8 mg daily + 40 units regular insulin in TPN  Assessment: 73 YOF presented on 05/03/14 with AMS and left-sided weakness. 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  Patient did not tolerate PO intake post-op due to persistent nausea and vomiting.  Unable to use GI tract per Gertie Fey and Pharmacy consulted to manage TPN for nutritional support.  She is at risk for refeeding syndrome.  GI: obesity / GERD.  17 lb weight loss in past week; baseline prealbumin low at 12.1.  Persistent Nausea, constipation - NGT removed but unable to tolerate PO med.  Miralax, Reglan, multivitamin,  PPI PO Endo: hypothyroidism on Synthroid.  DM2 on U-500 PTA.  CBGs uncontrolled prior to TPN initiation, CBGs much better after increasing insulin in TPN to 40 units daily. Lytes: K+ low normal, others WNL Renal: SCr stable, CrCL 70 ml/min Pulm: COPD - RA >> 2L [place], has thick secretions - Dulera, guaifenesin Cards: HTN / HLD - BP elevated, HR controlled - clonidine, Lasix Hepatobil: lipase / amylase / TG WNL.  LFTs mildly elevated. Heme/Onc: hgb 9.6, plts WNL - anastrozole Neuro: depression / neuropathy.  New right frontal meningioma causing seizure, s/p resection on 1/29 - Vimpat, Keppra, scopolamine patch, receiving PRN Dilaudid  ID: afebrile, WBC decreasing - not on abx Best Practices: SCDs TPN Access: right IJ placed 05/17/14 TPN day#: 3 (2/9 >> )  Current Nutrition:  TPN  Nutritional Goals:  1700-1900 kCal, 95-110grams of protein per day   Plan:  - Increase Clinimix E 5/15 to 60 ml/hr (goal 83 ml/hr) + IVFE at 10 ml/hr.  This will provide 72g protein and 1522 KCal. - Daily multivitamin and trace elements - Continue resistant SSI Q4H + Victoza + increase insulin in TPN to 45 units - F/U with possibility of enteral  nutrition - Consider starting IV Lopressor for better BP control (on felodipine, Toprol and lisinopril PTA) - F/U change Synthroid to IV (IV Synthroid dose should be half of PO dose) - F/U AM labs  Heide Guile, PharmD, BCPS Clinical Pharmacist Pager [phone]   05/22/2014, 8:10 AM

## 2014-05-22 NOTE — Progress Notes (Signed)
NUTRITION FOLLOW UP  Intervention:   TPN per pharmacy Diet advancement per MD RD to continue to monitor   Nutrition Dx:   Inadequate oral intake related to nausea and vomiting as evidenced by pt's report and 3 lb weight loss; ongoing  New Goal: Pt to meet >/= 90% of their estimated nutrition needs; unmet/progressing  Monitor:   PO intake, supplement acceptance, weight trend, labs  Assessment:   Pt admitted 1/24 with AMS, found to have seizures in the setting of meningioma. S/p resection 1/29 and remained intubated.   Pt remains NPO. Pt is currently receiving Clinimix E 5/15 at 45 ml/hr with plans to advance to goal of 83 ml/hr + IVFE at 10 ml/hr. TPN being advanced slowly due to refeeding risk and uncontrolled blood sugar. Glucose labs have been trending down; now <200 mg/dL.  Pt had bedside swallow evaluation yesterday- pt at severe aspiration risk; plan to reassess swallowing function after EGD. EGD done today and per MD note, normal EGD with somewhat edematous oral pharyngeal mucosa.   Pt's weight has dropped 17 lbs.  Labs: elevated glucose improving , low calcium; magnesium and phosphorus WNL  Height: Ht Readings from Last 1 Encounters:  05/08/14 5\' 4"  (1.626 m)    Weight Status:   Wt Readings from Last 1 Encounters:  05/19/14 221 lb 1.9 oz (100.3 kg)  05/12/14 238 lbs  Re-estimated needs:  Kcal: 1700-1950 Protein: 95-110 grams Fluid: 2.8 L/day  Skin: closed incision on head; +1 facial edema and RUE edema, +2 generalized edema  Diet Order: Diet NPO time specified TPN (CLINIMIX-E) Adult .TPN (CLINIMIX-E) Adult   Intake/Output Summary (Last 24 hours) at 05/22/14 1424 Last data filed at 05/22/14 1004  Gross per 24 hour  Intake     50 ml  Output      0 ml  Net     50 ml    Last BM: 2/6   Labs:   Recent Labs Lab 05/20/14 0512 05/21/14 0430 05/22/14 0555  NA 137 138 136  K 3.7 3.5 3.5  CL 99 101 100  CO2 31 34* 30  BUN 17 19 15   CREATININE 0.99  0.98 0.78  CALCIUM 7.9* 7.7* 8.0*  MG 2.1 2.1 1.9  PHOS 2.9 3.4 3.3  GLUCOSE 232* 200* 131*    CBG (last 3)   Recent Labs  05/22/14 0414 05/22/14 0810 05/22/14 1149  GLUCAP 117* 154* 198*    Scheduled Meds: . anastrozole  1 mg Oral Daily  . cloNIDine  0.3 mg Transdermal Weekly  . colchicine  0.6 mg Oral BID  . furosemide  40 mg Oral BID  . guaifenesin  400 mg Oral Q4H  . heparin subcutaneous  5,000 Units Subcutaneous 3 times per day  . insulin aspart  0-20 Units Subcutaneous 6 times per day  . lacosamide (VIMPAT) IV  200 mg Intravenous Q12H  . levETIRAcetam  500 mg Intravenous 3 times per day  . levothyroxine  200 mcg Oral Once per day on Mon Tue Wed Thu Fri Sat  . levothyroxine  300 mcg Oral Once every 7 days  . Liraglutide  1.8 mg Subcutaneous Daily  . metoCLOPramide (REGLAN) injection  10 mg Intravenous Q12H  . mometasone-formoterol  2 puff Inhalation BID  . pantoprazole (PROTONIX) IV  40 mg Intravenous Q12H  . polyethylene glycol  17 g Oral BID  . scopolamine  1 patch Transdermal Q72H    Continuous Infusions: . Marland KitchenTPN (CLINIMIX-E) Adult     And  .  fat emulsion    . sodium chloride 10 mL/hr at 05/16/14 0857  . Marland KitchenTPN (CLINIMIX-E) Adult 40 mL/hr at 05/21/14 1758   And  . fat emulsion 250 mL (05/21/14 1757)    Pryor Ochoa RD, LDN Inpatient Clinical Dietitian Pager: [phone] After Hours Pager: [phone]

## 2014-05-22 NOTE — Progress Notes (Signed)
Patient ID: Morgan [name], female   DOB: 1941/01/06, 74 y.o.   MRN: [id number] BP 146/83 mmHg  Pulse 116  Temp(Src) 97.6 F (36.4 C) (Oral)  Resp 20  Ht 5' 4"  (1.626 m)  Wt 100.3 kg (221 lb 1.9 oz)  BMI 37.94 kg/m2  SpO2 99% Alert and oriented x 4 Following all commands Moving all extremities Barium swallow tomorrow. 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

## 2014-05-22 NOTE — Anesthesia Postprocedure Evaluation (Signed)
  Anesthesia Post-op Note  Patient: Morgan [name]  Procedure(s) Performed: Procedure(s): ESOPHAGOGASTRODUODENOSCOPY (EGD) WITH PROPOFOL (N/A)  Patient Location: PACU  Anesthesia Type: MAC  Level of Consciousness: awake and alert   Airway and Oxygen Therapy: Patient Spontanous Breathing  Post-op Pain: none  Post-op Assessment: Post-op Vital signs reviewed, Patient's Cardiovascular Status Stable and Respiratory Function Stable  Post-op Vital Signs: Reviewed  Filed Vitals:   05/22/14 1045  BP: 167/80  Pulse: 110  Temp:   Resp: 23    Complications: No apparent anesthesia complications

## 2014-05-23 ENCOUNTER — Inpatient Hospital Stay (HOSPITAL_COMMUNITY): Payer: Medicare Other

## 2014-05-23 LAB — GLUCOSE, CAPILLARY
GLUCOSE-CAPILLARY: 185 mg/dL — AB (ref 70–99)
GLUCOSE-CAPILLARY: 175 mg/dL — AB (ref 70–99)
GLUCOSE-CAPILLARY: 285 mg/dL — AB (ref 70–99)
GLUCOSE-CAPILLARY: 202 mg/dL — AB (ref 70–99)
Glucose-Capillary: 189 mg/dL — ABNORMAL HIGH (ref 70–99)

## 2014-05-23 LAB — BASIC METABOLIC PANEL
Anion gap: 5 (ref 5–15)
BUN: 14 mg/dL (ref 6–23)
CO2: 33 mmol/L — ABNORMAL HIGH (ref 19–32)
CREATININE: 0.81 mg/dL (ref 0.50–1.10)
Calcium: 7.8 mg/dL — ABNORMAL LOW (ref 8.4–10.5)
Chloride: 96 mmol/L (ref 96–112)
GFR calc Af Amer: 82 mL/min — ABNORMAL LOW (ref >90)
GFR calc non Af Amer: 70 mL/min — ABNORMAL LOW (ref >90)
Glucose, Bld: 191 mg/dL — ABNORMAL HIGH (ref 70–99)
Potassium: 3.5 mmol/L (ref 3.5–5.1)
SODIUM: 134 mmol/L — AB (ref 135–145)

## 2014-05-23 LAB — MAGNESIUM: Magnesium: 1.6 mg/dL (ref 1.5–2.5)

## 2014-05-23 LAB — PHOSPHORUS: Phosphorus: 3.1 mg/dL (ref 2.3–4.6)

## 2014-05-23 MED ORDER — TRACE MINERALS CR-CU-F-FE-I-MN-MO-SE-ZN IV SOLN
INTRAVENOUS | Status: AC
  Administered 2014-05-23: 19:00:00 via INTRAVENOUS
  Filled 2014-05-23: qty 1440

## 2014-05-23 MED ORDER — MAGNESIUM SULFATE 2 GM/50ML IV SOLN
2.0000 g | Freq: Once | INTRAVENOUS | Status: AC
Start: 2014-05-23 — End: 2014-05-23
  Administered 2014-05-23: 2 g via INTRAVENOUS
  Filled 2014-05-23: qty 50

## 2014-05-23 MED ORDER — FAT EMULSION 20 % IV EMUL
250.0000 mL | INTRAVENOUS | Status: AC
  Administered 2014-05-23: 250 mL via INTRAVENOUS
  Filled 2014-05-23: qty 250

## 2014-05-23 NOTE — Progress Notes (Signed)
Subjective: No abdominal pain. Still with constipation. Nausea and vomiting improving. Swallowing slowly improving.  Objective: Vital signs in last 24 hours: Temp:  [97.7 F (36.5 C)-98.1 F (36.7 C)] 97.8 F (36.6 C) (02/13 1335) Pulse Rate:  [115-122] 122 (02/13 1335) Resp:  [20-22] 20 (02/13 1335) BP: (124-156)/(69-105) 134/105 mmHg (02/13 1335) SpO2:  [94 %-100 %] 100 % (02/13 1335) Weight change:  Last BM Date: 05/16/14  PE: GEN:  Chronically ill-appearing but is in NAD HEENT:  Craniotomy scar; no NGT  ABD:  Soft, non-distended, non-tender, active bowel sounds.  Lab Results: CBC    Component Value Date/Time   WBC 14.4* 05/20/2014 0512   RBC 3.13* 05/20/2014 0512   HGB 9.6* 05/20/2014 0512   HCT 29.0* 05/20/2014 0512   PLT 152 05/20/2014 0512   MCV 92.7 05/20/2014 0512   MCH 30.7 05/20/2014 0512   MCHC 33.1 05/20/2014 0512   RDW 13.6 05/20/2014 0512   LYMPHSABS 1.4 05/20/2014 0512   MONOABS 0.7 05/20/2014 0512   EOSABS 0.1 05/20/2014 0512   BASOSABS 0.0 05/20/2014 0512   CMP     Component Value Date/Time   NA 134* 05/23/2014 0400   K 3.5 05/23/2014 0400   CL 96 05/23/2014 0400   CO2 33* 05/23/2014 0400   GLUCOSE 191* 05/23/2014 0400   BUN 14 05/23/2014 0400   CREATININE 0.81 05/23/2014 0400   CREATININE 1.32* 09/11/2011 1110   CALCIUM 7.8* 05/23/2014 0400   PROT 5.1* 05/21/2014 0430   ALBUMIN 1.9* 05/21/2014 0430   AST 54* 05/21/2014 0430   ALT 52* 05/21/2014 0430   ALKPHOS 191* 05/21/2014 0430   BILITOT 1.0 05/21/2014 0430   GFRNONAA 70* 05/23/2014 0400   GFRAA 82* 05/23/2014 0400   Assessment:  1.  Nausea and vomiting.  Improving. 2.  Dysphagia, seemingly oropharyngeal based on MBS, is slowly improving. 3.  Constipation.  Unclear if patient is eating enough to have bowel movements at present.  Exam not worrisome for bowel obstruction. 4.  Elevated LFTs.  Likely fatty liver.  TPN might be contributing.  Plan:  1.  Abdominal xray to assess  for fecal burden; pending these results, might consider Miralax or dulcolax suppositories. 2.  Gradually advance diet, under recommendations from Speech Therapy. 3.  Continue parenteral nutrition for now. 4.  Follow LFTs. 5.  Will revisit Monday.   Landry Dyke 05/23/2014, 2:49 PM

## 2014-05-23 NOTE — Progress Notes (Signed)
PARENTERAL NUTRITION CONSULT NOTE - FOLLOW UP  Pharmacy Consult:  TPN Indication:  Bowel rest  Patient Measurements: Height: 5\' 4"  (162.6 cm) Weight: 221 lb 1.9 oz (100.3 kg) IBW/kg (Calculated) : 54.7  Vital Signs: Temp: 97.9 F (36.6 C) (02/13 0600) Temp Source: Oral (02/13 0600) BP: 124/81 mmHg (02/13 0600) Pulse Rate: 117 (02/13 0600) Intake/Output from previous day: 02/12 0701 - 02/13 0700 In: 60 [I.V.:60] Out: -   Labs: No results for input(s): WBC, HGB, HCT, PLT, APTT, INR in the last 72 hours.   Recent Labs  05/21/14 0430 05/22/14 0555 05/23/14 0400  NA 138 136 134*  K 3.5 3.5 3.5  CL 101 100 96  CO2 34* 30 33*  GLUCOSE 200* 131* 191*  BUN 19 15 14   CREATININE 0.98 0.78 0.81  CALCIUM 7.7* 8.0* 7.8*  MG 2.1 1.9 1.6  PHOS 3.4 3.3 3.1  PROT 5.1*  --   --   ALBUMIN 1.9*  --   --   AST 54*  --   --   ALT 52*  --   --   ALKPHOS 191*  --   --   BILITOT 1.0  --   --    Estimated Creatinine Clearance: 71.2 mL/min (by C-G formula based on Cr of 0.81).    Recent Labs  05/22/14 1949 05/22/14 2351 05/23/14 0347  GLUCAP 214* 186* 185*     Insulin Requirements in the past 24 hours:  26 units resistant SSI + Victoza 1.8 mg daily + 45 units regular insulin in TPN  Assessment: 73 YOF presented on 05/03/14 with AMS and left-sided weakness. 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  Patient did not tolerate PO intake post-op due to persistent nausea and vomiting.  Unable to use GI tract per Gertie Fey and Pharmacy consulted to manage TPN for nutritional support.  She is at risk for refeeding syndrome.  GI: obesity / GERD.  17 lb weight loss in past week; baseline prealbumin low at 12.1.  Persistent Nausea, constipation - NGT removed but unable to tolerate PO med.  EGD performed 2/12 - essentially normal.  Miralax, Reglan, multivitamin, PPI PO Endo: hypothyroidism on Synthroid.  DM2 on U-500 PTA.  CBGs uncontrolled prior to TPN  initiation, CBGs worse with increasing TPN rate.  Has 45 units of insulin in TPN bag. Lytes: k 3.5, Mg 1.6, Phos 3.1, Ca 7.8 (corrected 9.5) Renal: SCr stable, CrCL 70 ml/min Pulm: COPD - RA >> 1L [place], has thick secretions - Dulera, guaifenesin Cards: HTN / HLD - BP elevated, HR controlled - clonidine, Lasix Hepatobil: lipase / amylase / TG WNL.  LFTs mildly elevated. Heme/Onc: hgb 9.6, plts WNL - anastrozole Neuro: depression / neuropathy.  New right frontal meningioma causing seizure, s/p resection on 1/29 - Vimpat, Keppra, scopolamine patch, receiving PRN Dilaudid  ID: afebrile, WBC decreasing - not on abx Best Practices: SCDs TPN Access: right IJ placed 05/17/14 TPN day#: 4 (2/9 >> )  Current Nutrition:  TPN  Nutritional Goals:  1700-1900 kCal, 95-110grams of protein per day   Plan:  - Continue Clinimix E 5/15 to 60 ml/hr (goal 83 ml/hr) + IVFE at 10 ml/hr.  This will provide 72g protein and 1522 KCal. - Daily multivitamin and trace elements - Continue resistant SSI Q4H + Victoza + increase insulin in TPN to 57 units - F/U with possibility of enteral nutrition - Consider starting IV Lopressor for better BP control (on felodipine, Toprol and lisinopril  PTA) - F/U change Synthroid to IV (IV Synthroid dose should be half of PO dose) - Magnesium 2g IV x 1  Heide Guile, PharmD, [facility] Clinical Pharmacist Pager [phone]   05/23/2014, 7:36 AM

## 2014-05-23 NOTE — Procedures (Signed)
Objective Swallowing Evaluation: Modified Barium Swallowing Study  Patient Details  Name: Morgan [name] MRN: [id number] Date of Birth: 01-28-1941  Today's Date: 05/23/2014 Time: SLP Start Time (ACUTE ONLY): 1245-SLP Stop Time (ACUTE ONLY): 1330 SLP Time Calculation (min) (ACUTE ONLY): 45 min  Past Medical History:  Past Medical History  Diagnosis Date  . Diabetes mellitus   . Obesity   . Diabetes type 2, uncontrolled   . Hypoglycemia associated with diabetes   . Edema of both legs   . Hypertension   . Combined hyperlipidemia   . Acquired autoimmune hypothyroidism   . Thyroiditis, autoimmune   . Abnormal liver function tests   . Sleep apnea, obstructive   . History of gastroesophageal reflux (GERD)   . Depression   . DM neuropathy with neurologic complication   . COPD with acute exacerbation   . Goiter   . Fatigue   . Vertigo   . Type II diabetes mellitus with peripheral angiopathy   . Gout   . Pallor    Past Surgical History:  Past Surgical History  Procedure Laterality Date  . Laparoscopic gastric banding    . Back surgery    . Craniotomy Right 05/08/2014    Procedure: CRANIOTOMY FOR MENINGIOMA;  Surgeon: Ashok Pall, MD;  Location: [place] [facility];  Service: Neurosurgery;  Laterality: Right;  Right Craniotomy for meningioma   HPI:  HPI: 74 year old female admitted 05/03/14, who underwent Craniotomy post Meningioma Resection. PMH significant for GERD, COPD, DM, Seizures, and VDRF. MBS ordered to assess readiness to advance diet.  No Data Recorded  Assessment / Plan / Recommendation CHL IP CLINICAL IMPRESSIONS 05/23/2014  Dysphagia Diagnosis Moderate oral phase dysphagia;Moderate pharyngeal phase dysphagia  Clinical impression Moderate oropharyngeal sensory and motor based dysphagia, characterized by poor bolus formation and reduced posterior propulsion orally, Delayed swallow reflex across consistencies due to decreased sensation for primary trigger site. Vallecular  residue was noted across consistencies as well, which was reduced with dry swallow and/or alternating solids and liquids. Frank aspiration of thin liquids was noted during the swallow with delayed cough response. Pt was instructed to take a small sip, but instead filled oral cavity with thin barium. When bolus size was limited by SLP, pt tolerated thin liquids without penetration or aspiration. At this time, recommend puree diet with nectar thick liquids, crushed meds. Pt may have 1-2 ice chips or 1-2 bites of Icee/Italian Ice with 1:1 supervision after oral care. ST to contnue to follow for tolerance of advanced diet. Son present during evaluation, and was agreeable to recommendations post-study.      CHL IP TREATMENT RECOMMENDATION 05/23/2014  Treatment Plan Recommendations Therapy as outlined in treatment plan below     CHL IP DIET RECOMMENDATION 05/23/2014  Diet Recommendations Dysphagia 1 (Puree);Nectar-thick liquid  Liquid Administration via Straw;Cup  Medication Administration Crushed with puree  Compensations Slow rate;Small sips/bites;Follow solids with liquid;Multiple dry swallows after each bite/sip  Postural Changes and/or Swallow Maneuvers Upright 30-60 min after meal;Seated upright 90 degrees     CHL IP OTHER RECOMMENDATIONS 05/23/2014  Recommended Consults (None)  Oral Care Recommendations Oral care Q4 per protocol  Other Recommendations Order thickener from pharmacy     CHL IP FOLLOW UP RECOMMENDATIONS 05/23/2014  Follow up Recommendations Inpatient Rehab;24 hour supervision/assistance     CHL IP FREQUENCY AND DURATION 05/23/2014  Speech Therapy Frequency (ACUTE ONLY) min 3x week  Treatment Duration 1 week     Pertinent Vitals/Pain Pt reported sore throat  SLP Swallow Goals No flowsheet data found.  No flowsheet data found.    CHL IP REASON FOR REFERRAL 05/23/2014  Reason for Referral Objectively evaluate swallowing function     CHL IP ORAL PHASE 05/23/2014  Lips (None)   Tongue (None)  Mucous membranes (None)  Nutritional status (None)  Other (None)  Oxygen therapy (None)  Oral Phase Impaired  Oral - Pudding Teaspoon (None)  Oral - Pudding Cup (None)  Oral - Honey Teaspoon (None)  Oral - Honey Cup (None)  Oral - Honey Syringe (None)  Oral - Nectar Teaspoon (None)  Oral - Nectar Cup (None)  Oral - Nectar Straw Weak lingual manipulation;Reduced posterior propulsion;Piecemeal swallowing;Delayed oral transit  Oral - Nectar Syringe (None)  Oral - Ice Chips (None)  Oral - Thin Teaspoon Weak lingual manipulation;Delayed oral transit;Reduced posterior propulsion;Piecemeal swallowing  Oral - Thin Cup (None)  Oral - Thin Straw Delayed oral transit;Weak lingual manipulation;Reduced posterior propulsion;Piecemeal swallowing  Oral - Thin Syringe (None)  Oral - Puree Piecemeal swallowing;Reduced posterior propulsion;Delayed oral transit;Weak lingual manipulation  Oral - Mechanical Soft Piecemeal swallowing;Reduced posterior propulsion;Delayed oral transit;Weak lingual manipulation  Oral - Regular (None)  Oral - Multi-consistency (None)  Oral - Pill (None)  Oral Phase - Comment (None)      CHL IP PHARYNGEAL PHASE 05/23/2014  Pharyngeal Phase Impaired  Pharyngeal - Pudding Teaspoon (None)  Penetration/Aspiration details (pudding teaspoon) (None)  Pharyngeal - Pudding Cup (None)  Penetration/Aspiration details (pudding cup) (None)  Pharyngeal - Honey Teaspoon (None)  Penetration/Aspiration details (honey teaspoon) (None)  Pharyngeal - Honey Cup (None)  Penetration/Aspiration details (honey cup) (None)  Pharyngeal - Honey Syringe (None)  Penetration/Aspiration details (honey syringe) (None)  Pharyngeal - Nectar Teaspoon (None)  Penetration/Aspiration details (nectar teaspoon) (None)  Pharyngeal - Nectar Cup (None)  Penetration/Aspiration details (nectar cup) (None)  Pharyngeal - Nectar Straw Delayed swallow initiation;Premature spillage to  valleculae;Reduced tongue base retraction;Pharyngeal residue - valleculae;Compensatory strategies attempted (Comment)  Penetration/Aspiration details (nectar straw) (None)  Pharyngeal - Nectar Syringe (None)  Penetration/Aspiration details (nectar syringe) (None)  Pharyngeal - Ice Chips (None)  Penetration/Aspiration details (ice chips) (None)  Pharyngeal - Thin Teaspoon Delayed swallow initiation;Premature spillage to pyriform sinuses;Premature spillage to valleculae;Reduced tongue base retraction;Pharyngeal residue - valleculae;Compensatory strategies attempted (Comment)  Penetration/Aspiration details (thin teaspoon) (None)  Pharyngeal - Thin Cup (None)  Penetration/Aspiration details (thin cup) (None)  Pharyngeal - Thin Straw Delayed swallow initiation;Premature spillage to valleculae;Premature spillage to pyriform sinuses;Reduced tongue base retraction;Pharyngeal residue - valleculae;Compensatory strategies attempted (Comment);Penetration/Aspiration during swallow;Moderate aspiration;Reduced airway/laryngeal closure  Penetration/Aspiration details (thin straw) Material enters airway, passes BELOW cords and not ejected out despite cough attempt by patient  Pharyngeal - Thin Syringe (None)  Penetration/Aspiration details (thin syringe') (None)  Pharyngeal - Puree Delayed swallow initiation;Premature spillage to valleculae;Reduced tongue base retraction;Pharyngeal residue - valleculae;Compensatory strategies attempted (Comment)  Penetration/Aspiration details (puree) (None)  Pharyngeal - Mechanical Soft Delayed swallow initiation;Premature spillage to valleculae;Reduced tongue base retraction;Pharyngeal residue - valleculae;Compensatory strategies attempted (Comment)  Penetration/Aspiration details (mechanical soft) (None)  Pharyngeal - Regular (None)  Penetration/Aspiration details (regular) (None)  Pharyngeal - Multi-consistency (None)  Penetration/Aspiration details (multi-consistency)  (None)  Pharyngeal - Pill (None)  Penetration/Aspiration details (pill) (None)  Pharyngeal Comment (None)     CHL IP CERVICAL ESOPHAGEAL PHASE 05/23/2014  Cervical Esophageal Phase (No Data)  Pudding Teaspoon (None)  Pudding Cup (None)  Honey Teaspoon (None)  Honey Cup (None)  Honey Syringe (None)  Nectar Teaspoon (None)  Nectar Cup (None)  Nectar Straw (None)  Nectar Syringe (None)  Thin Teaspoon (None)  Thin Cup (None)  Thin Straw (None)  Thin Syringe (None)  Cervical Esophageal Comment (None)    No flowsheet data found.  Lakiyah Arntson [facility], CCC-SLP [phone]       Shonna Chock 05/23/2014, 1:58 PM

## 2014-05-23 NOTE — Progress Notes (Signed)
Unable to do chest vest at this time. Pt is out of the room.

## 2014-05-23 NOTE — Progress Notes (Signed)
Patient ID: Morgan [name], female   DOB: 1940-04-15, 74 y.o.   MRN: [id number] Wound dry. No c/o. ba swallow today

## 2014-05-24 LAB — GLUCOSE, CAPILLARY
Glucose-Capillary: 134 mg/dL — ABNORMAL HIGH (ref 70–99)
Glucose-Capillary: 140 mg/dL — ABNORMAL HIGH (ref 70–99)
Glucose-Capillary: 200 mg/dL — ABNORMAL HIGH (ref 70–99)
Glucose-Capillary: 269 mg/dL — ABNORMAL HIGH (ref 70–99)
Glucose-Capillary: 222 mg/dL — ABNORMAL HIGH (ref 70–99)
Glucose-Capillary: 143 mg/dL — ABNORMAL HIGH (ref 70–99)
Glucose-Capillary: 160 mg/dL — ABNORMAL HIGH (ref 70–99)

## 2014-05-24 MED ORDER — TRACE MINERALS CR-CU-F-FE-I-MN-MO-SE-ZN IV SOLN
INTRAVENOUS | Status: AC
  Administered 2014-05-24: 18:00:00 via INTRAVENOUS
  Filled 2014-05-24: qty 1440

## 2014-05-24 MED ORDER — DULOXETINE HCL 60 MG PO CPEP
60.0000 mg | ORAL_CAPSULE | Freq: Two times a day (BID) | ORAL | Status: DC
  Administered 2014-05-24 – 2014-05-29 (×5): 60 mg via ORAL
  Filled 2014-05-24 (×8): qty 1

## 2014-05-24 MED ORDER — GUAIFENESIN ER 600 MG PO TB12
600.0000 mg | ORAL_TABLET | Freq: Two times a day (BID) | ORAL | Status: DC
  Administered 2014-05-28 – 2014-05-29 (×3): 600 mg via ORAL
  Filled 2014-05-24 (×6): qty 1

## 2014-05-24 MED ORDER — FAT EMULSION 20 % IV EMUL
250.0000 mL | INTRAVENOUS | Status: AC
  Administered 2014-05-24: 250 mL via INTRAVENOUS
  Filled 2014-05-24: qty 250

## 2014-05-24 MED ORDER — GUAIFENESIN 100 MG/5ML PO SYRP
400.0000 mg | ORAL_SOLUTION | Freq: Four times a day (QID) | ORAL | Status: DC | PRN
  Administered 2014-05-25: 400 mg via ORAL
  Filled 2014-05-24 (×2): qty 20

## 2014-05-24 NOTE — Progress Notes (Signed)
Speech Language Pathology Treatment: Dysphagia  Patient Details Name: Morgan [name] MRN: [id number] DOB: [birth year] Today's Date: 05/24/2014 Time: [id number] SLP Time Calculation (min) (ACUTE ONLY): 13 min  Assessment / Plan / Recommendation Clinical Impression  Treatment focused on therapeutic po trials and patient/family education. Patient with severe lethargy, responsive, however requiring max verbal and tactile cueing to maintain appropriate level of alertness for po trials. Minimal teaspoon boluses of thin liquid (italian ice) provided without overt indication of aspiration but with max verbal and tactile (empty spoon presentation) for use of dry swallow to aid in pharyngeal clearance of bolus. Education provided with daughter who was present for treatment regarding results of MBS, rationale for diet recommendations, and prognosis for ability to advance diet with both level of alertness and nausea being contributing barriers to progression at this time. Will continue to f/u. Current diet remains appropriate however patient should only eat/be fed when fully alert and able to carryout compensatory strategies indicated by [facility] 2/13.    HPI HPI: 74 year old female admitted 05/03/14, who underwent Craniotomy post Meningioma Resection. PMH significant for GERD, COPD, DM, Seizures, and VDRF. MBS ordered to assess readiness to advance diet.   Pertinent Vitals Pain Assessment: No/denies pain  SLP Plan  Continue with current plan of care    Recommendations Diet recommendations: Dysphagia 1 (puree);Nectar-thick liquid Liquids provided via: Teaspoon Medication Administration: Crushed with puree Supervision: Patient able to self feed;Full supervision/cueing for compensatory strategies Compensations: Slow rate;Small sips/bites;Follow solids with liquid;Multiple dry swallows after each bite/sip Postural Changes and/or Swallow Maneuvers: Upright 30-60 min after meal;Seated upright 90 degrees               Oral Care Recommendations: Oral care BID Follow up Recommendations: Inpatient Rehab;24 hour supervision/assistance Plan: Continue with current plan of care    [place], [place] [phone]  Morgan [name] [name] 05/24/2014, 10:41 AM

## 2014-05-24 NOTE — Progress Notes (Signed)
More alert threw the afternoon; out of bed with the steady; was up in the chair over 2 hours without distress; also taken into the bathroom with the steady twice just to void; no BM.

## 2014-05-24 NOTE — Progress Notes (Signed)
Subjective: Patient remains lethargic but arousable. I spoke at length with a family member  Objective: Vital signs in last 24 hours: Temp:  [97.8 F (36.6 C)-98.6 F (37 C)] 98.6 F (37 C) (02/14 1016) Pulse Rate:  [102-123] 115 (02/14 1016) Resp:  [20-22] 21 (02/14 1016) BP: (120-134)/(54-105) 125/65 mmHg (02/14 1016) SpO2:  [96 %-100 %] 100 % (02/14 1016)  Intake/Output from previous day: 06-06-2022 0730 - 02/14 0729 In: 1260 [P.O.:240; IV Piggyback:300; TPN:720] Out: 750 [Urine:750] Intake/Output this shift:    Resting quietly at this moment  Lab Results: Lab Results  Component Value Date   WBC 14.4* 05/20/2014   HGB 9.6* 05/20/2014   HCT 29.0* 05/20/2014   MCV 92.7 05/20/2014   PLT 152 05/20/2014   Lab Results  Component Value Date   INR 1.11 05/07/2014   BMET Lab Results  Component Value Date   NA 134* 06-06-14   K 3.5 Jun 06, 2014   CL 96 June 06, 2014   CO2 33* 2014/06/06   GLUCOSE 191* 06-Jun-2014   BUN 14 06-06-2014   CREATININE 0.81 06/06/14   CALCIUM 7.8* 2014-06-06    Studies/Results: Dg Abd 2 Views  2014/06/06   CLINICAL DATA:  Constipation.  EXAM: ABDOMEN - 2 VIEW  COMPARISON:  05/15/2014  FINDINGS: Small amount of oral contrast seen within stomach and small bowel. No evidence of dilated bowel loops. No evidence of free intraperitoneal air. No significant stool burden noted.  IMPRESSION: Unremarkable bowel gas pattern.  No acute findings.   Electronically Signed   By: Earle Gell M.D.   On: 2014-06-06 16:44   Dg Swallowing Func-speech Pathology  06-06-14   Lorre Nick, [place]     June 06, 2014  1:59 PM  Objective Swallowing Evaluation: Modified Barium Swallowing Study   Patient Details  Name: Morgan [name] MRN: [id number] Date of Birth: 10/24/1940  Today'[name] Date: 06/06/14 Time: SLP Start Time (ACUTE ONLY): 1245-SLP Stop Time (ACUTE  ONLY): 1330 SLP Time Calculation (min) (ACUTE ONLY): 45 min  Past Medical History:  Past Medical History  Diagnosis Date   . Diabetes mellitus   . Obesity   . Diabetes type 2, uncontrolled   . Hypoglycemia associated with diabetes   . Edema of both legs   . Hypertension   . Combined hyperlipidemia   . Acquired autoimmune hypothyroidism   . Thyroiditis, autoimmune   . Abnormal liver function tests   . Sleep apnea, obstructive   . History of gastroesophageal reflux (GERD)   . Depression   . DM neuropathy with neurologic complication   . COPD with acute exacerbation   . Goiter   . Fatigue   . Vertigo   . Type II diabetes mellitus with peripheral angiopathy   . Gout   . Pallor    Past Surgical History:  Past Surgical History  Procedure Laterality Date  . Laparoscopic gastric banding    . Back surgery    . Craniotomy Right 05/08/2014    Procedure: CRANIOTOMY FOR MENINGIOMA;  Surgeon: Ashok Pall,  MD;  Location: [place] [facility];  Service: Neurosurgery;  Laterality:  Right;  Right Craniotomy for meningioma   HPI:  HPI: 74 year old female admitted 05/03/14, who underwent  Craniotomy post Meningioma Resection. PMH significant for GERD,  COPD, DM, Seizures, and VDRF. MBS ordered to assess readiness to  advance diet.  No Data Recorded  Assessment / Plan / Recommendation CHL IP CLINICAL IMPRESSIONS June 06, 2014  Dysphagia Diagnosis Moderate oral phase dysphagia;Moderate  pharyngeal phase dysphagia  Clinical impression Moderate oropharyngeal sensory and motor  based dysphagia, characterized by poor bolus formation and  reduced posterior propulsion orally, Delayed swallow reflex  across consistencies due to decreased sensation for primary  trigger site. Vallecular residue was noted across consistencies  as well, which was reduced with dry swallow and/or alternating  solids and liquids. Frank aspiration of thin liquids was noted  during the swallow with delayed cough response. Pt was instructed  to take a small sip, but instead filled oral cavity with thin  barium. When bolus size was limited by SLP, pt tolerated thin  liquids without penetration or  aspiration. At this time,  recommend puree diet with nectar thick liquids, crushed meds. Pt  may have 1-2 ice chips or 1-2 bites of Icee/Italian Ice with 1:1  supervision after oral care. ST to contnue to follow for  tolerance of advanced diet. Son present during evaluation, and  was agreeable to recommendations post-study.      CHL IP TREATMENT RECOMMENDATION 05/23/2014  Treatment Plan Recommendations Therapy as outlined in treatment  plan below     CHL IP DIET RECOMMENDATION 05/23/2014  Diet Recommendations Dysphagia 1 (Puree);Nectar-thick liquid  Liquid Administration via Straw;Cup  Medication Administration Crushed with puree  Compensations Slow rate;Small sips/bites;Follow solids with  liquid;Multiple dry swallows after each bite/sip  Postural Changes and/or Swallow Maneuvers Upright 30-60 min after  meal;Seated upright 90 degrees     CHL IP OTHER RECOMMENDATIONS 05/23/2014  Recommended Consults (None)  Oral Care Recommendations Oral care Q4 per protocol  Other Recommendations Order thickener from pharmacy     CHL IP FOLLOW UP RECOMMENDATIONS 05/23/2014  Follow up Recommendations Inpatient Rehab;24 hour  supervision/assistance    CHL IP FREQUENCY AND DURATION 05/23/2014  Speech Therapy Frequency (ACUTE ONLY) min 3x week  Treatment Duration 1 week     Pertinent Vitals/Pain Pt reported sore throat   SLP Swallow Goals No flowsheet data found.  No flowsheet data found.    CHL IP REASON FOR REFERRAL 05/23/2014  Reason for Referral Objectively evaluate swallowing function     CHL IP ORAL PHASE 05/23/2014  Lips (None)  Tongue (None)  Mucous membranes (None)  Nutritional status (None)  Other (None)  Oxygen therapy (None)  Oral Phase Impaired  Oral - Pudding Teaspoon (None) Oral - Pudding Cup (None)  Oral - Honey Teaspoon (None)  Oral - Honey Cup (None) Oral - Honey Syringe (None)  Oral - Nectar Teaspoon (None)  Oral - Nectar Cup (None) Oral - Nectar Straw Weak lingual  manipulation;Reduced posterior propulsion;Piecemeal   swallowing;Delayed oral transit  Oral - Nectar Syringe (None)  Oral - Ice Chips (None)  Oral - Thin Teaspoon Weak lingual manipulation;Delayed oral  transit;Reduced posterior propulsion;Piecemeal swallowing  Oral - Thin Cup (None)  Oral - Thin Straw Delayed oral transit;Weak lingual  manipulation;Reduced posterior propulsion;Piecemeal swallowing  Oral - Thin Syringe (None)  Oral - Puree Piecemeal swallowing;Reduced posterior  propulsion;Delayed oral transit;Weak lingual manipulation  Oral - Mechanical Soft Piecemeal swallowing;Reduced posterior  propulsion;Delayed oral transit;Weak lingual manipulation  Oral - Regular (None)  Oral - Multi-consistency (None)  Oral - Pill (None) Oral Phase - Comment (None)      CHL IP PHARYNGEAL PHASE 05/23/2014  Pharyngeal Phase Impaired  Pharyngeal - Pudding Teaspoon (None)  Penetration/Aspiration details (pudding teaspoon) (None)  Pharyngeal - Pudding Cup (None)  Penetration/Aspiration details (pudding cup) (None)  Pharyngeal - Honey Teaspoon (None)  Penetration/Aspiration details (honey teaspoon) (None)  Pharyngeal - Honey Cup (None)  Penetration/Aspiration details (honey  cup) (None)  Pharyngeal - Honey Syringe (None)  Penetration/Aspiration details (honey syringe) (None)  Pharyngeal - Nectar Teaspoon (None)  Penetration/Aspiration details (nectar teaspoon) (None)  Pharyngeal - Nectar Cup (None)  Penetration/Aspiration details (nectar cup) (None)  Pharyngeal - Nectar Straw Delayed swallow initiation;Premature  spillage to valleculae;Reduced tongue base retraction;Pharyngeal  residue - valleculae;Compensatory strategies attempted (Comment)  Penetration/Aspiration details (nectar straw) (None)  Pharyngeal - Nectar Syringe (None)  Penetration/Aspiration details (nectar syringe) (None)  Pharyngeal - Ice Chips (None)  Penetration/Aspiration details (ice chips) (None)  Pharyngeal - Thin Teaspoon Delayed swallow initiation;Premature  spillage to pyriform sinuses;Premature spillage to   valleculae;Reduced tongue base retraction;Pharyngeal residue -  valleculae;Compensatory strategies attempted (Comment)  Penetration/Aspiration details (thin teaspoon) (None)  Pharyngeal - Thin Cup (None)  Penetration/Aspiration details (thin cup) (None)  Pharyngeal - Thin Straw Delayed swallow initiation;Premature  spillage to valleculae;Premature spillage to pyriform  sinuses;Reduced tongue base retraction;Pharyngeal residue -  valleculae;Compensatory strategies attempted  (Comment);Penetration/Aspiration during swallow;Moderate  aspiration;Reduced airway/laryngeal closure  Penetration/Aspiration details (thin straw) Material enters  airway, passes BELOW cords and not ejected out despite cough  attempt by patient  Pharyngeal - Thin Syringe (None)  Penetration/Aspiration details (thin syringe') (None)  Pharyngeal - Puree Delayed swallow initiation;Premature spillage  to valleculae;Reduced tongue base retraction;Pharyngeal residue -  valleculae;Compensatory strategies attempted (Comment)  Penetration/Aspiration details (puree) (None)  Pharyngeal - Mechanical Soft Delayed swallow initiation;Premature  spillage to valleculae;Reduced tongue base retraction;Pharyngeal  residue - valleculae;Compensatory strategies attempted (Comment)  Penetration/Aspiration details (mechanical soft) (None)  Pharyngeal - Regular (None)  Penetration/Aspiration details (regular) (None)  Pharyngeal - Multi-consistency (None)  Penetration/Aspiration details (multi-consistency) (None)  Pharyngeal - Pill (None)  Penetration/Aspiration details (pill) (None)  Pharyngeal Comment (None)     CHL IP CERVICAL ESOPHAGEAL PHASE 05/23/2014  Cervical Esophageal Phase (No Data)  Pudding Teaspoon (None)  Pudding Cup (None)  Honey Teaspoon (None)  Honey Cup (None)  Honey Syringe (None)  Nectar Teaspoon (None)  Nectar Cup (None)  Nectar Straw (None) Nectar Syringe (None)  Thin Teaspoon (None)  Thin Cup (None)  Thin Straw (None)  Thin Syringe (None)  Cervical  Esophageal Comment (None)    No flowsheet data found.  Morgan [name] [facility], CCC-SLP [phone]       Morgan [name] 05/23/2014, 1:58 PM     Assessment/Plan: Continue current management   LOS: 21 days    Morgan [name] 05/24/2014, 11:23 AM

## 2014-05-24 NOTE — Progress Notes (Signed)
PARENTERAL NUTRITION CONSULT NOTE - FOLLOW UP  Pharmacy Consult:  TPN Indication:  Bowel rest  Patient Measurements: Height: 5\' 4"  (162.6 cm) Weight: 221 lb 1.9 oz (100.3 kg) IBW/kg (Calculated) : 54.7  Vital Signs: Temp: 97.8 F (36.6 C) (02/14 0549) Temp Source: Oral (02/14 0549) BP: 127/60 mmHg (02/14 0549) Pulse Rate: 102 (02/14 0549) Intake/Output from previous day: 02/13 0701 - 02/14 0700 In: 1260 [P.O.:240; IV Piggyback:300; TPN:720] Out: 750 [Urine:750]  Labs: No results for input(s): WBC, HGB, HCT, PLT, APTT, INR in the last 72 hours.   Recent Labs  05/22/14 0555 05/23/14 0400  NA 136 134*  K 3.5 3.5  CL 100 96  CO2 30 33*  GLUCOSE 131* 191*  BUN 15 14  CREATININE 0.78 0.81  CALCIUM 8.0* 7.8*  MG 1.9 1.6  PHOS 3.3 3.1   Estimated Creatinine Clearance: 71.2 mL/min (by C-G formula based on Cr of 0.81).    Recent Labs  05/24/14 0126 05/24/14 0518 05/24/14 0739  GLUCAP 134* 140* 200*     Insulin Requirements in the past 24 hours:  28 units resistant SSI + Victoza 1.8 mg daily + 57 units regular insulin in TPN  Assessment: Morgan [name] presented on 05/03/14 with AMS and left-sided weakness. 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  Patient did not tolerate PO intake post-op due to persistent nausea and vomiting.  Unable to use GI tract per Gertie Fey and Pharmacy consulted to manage TPN for nutritional support.  She is at risk for refeeding syndrome.  GI: obesity / GERD.  17 lb weight loss in past week; baseline prealbumin low at 12.1.  Persistent Nausea, constipation - NGT removed but unable to tolerate PO med.  EGD performed 2/12 - essentially normal.  Dysphasia I diet started on 2/13.  Miralax, Reglan, multivitamin, PPI PO Endo: hypothyroidism on Synthroid.  DM2 on U-500 PTA.  CBGs uncontrolled prior to TPN initiation and worse with increasing TPN rate.  Has 57 units of insulin in TPN bag. Lytes: k 3.5, Mg 1.6, Phos 3.1, Ca  7.8 (corrected 9.5) Renal: SCr stable, CrCL 70 ml/min Pulm: COPD - RA >> 1L [place], has thick secretions - Dulera, guaifenesin Cards: HTN / HLD - BP mildly elevated, HR elevated - clonidine, Lasix Hepatobil: lipase / amylase / TG WNL.  LFTs mildly elevated. Heme/Onc: hgb 9.6, plts WNL - anastrozole Neuro: depression / neuropathy.  New right frontal meningioma causing seizure, s/p resection on 1/29 - Vimpat, Keppra, scopolamine patch, receiving PRN Dilaudid  ID: afebrile, WBC decreasing - not on abx Best Practices: SCDs TPN Access: right IJ placed 05/17/14 TPN day#: 5 (2/9 >> )  Current Nutrition:  TPN  Nutritional Goals:  1700-1900 kCal, 95-110grams of protein per day   Plan:  - Continue Clinimix E 5/15 to 60 ml/hr (goal 83 ml/hr) + IVFE at 10 ml/hr.  This will provide 72g protein and 1522 KCal. - Daily multivitamin and trace elements - Continue resistant SSI Q4H + Victoza + increase insulin in TPN to 65 units - F/U diet tolerance - Consider starting IV Lopressor for better BP control (on felodipine, Toprol and lisinopril PTA) - TPN labs in AM  Heide Guile, PharmD, [facility] Clinical Pharmacist Pager [phone]   05/24/2014, 8:03 AM

## 2014-05-24 NOTE — Progress Notes (Signed)
Pt unable to do chest vest at this time due to nausea.

## 2014-05-25 ENCOUNTER — Encounter (HOSPITAL_COMMUNITY): Payer: Self-pay | Admitting: Gastroenterology

## 2014-05-25 LAB — GLUCOSE, CAPILLARY
GLUCOSE-CAPILLARY: 174 mg/dL — AB (ref 70–99)
GLUCOSE-CAPILLARY: 183 mg/dL — AB (ref 70–99)
GLUCOSE-CAPILLARY: 208 mg/dL — AB (ref 70–99)
GLUCOSE-CAPILLARY: 193 mg/dL — AB (ref 70–99)
Glucose-Capillary: 154 mg/dL — ABNORMAL HIGH (ref 70–99)

## 2014-05-25 LAB — DIFFERENTIAL
BASOS ABS: 0 10*3/uL (ref 0.0–0.1)
Basophils Relative: 1 % (ref 0–1)
Eosinophils Absolute: 0.2 10*3/uL (ref 0.0–0.7)
Eosinophils Relative: 3 % (ref 0–5)
LYMPHS ABS: 1.6 10*3/uL (ref 0.7–4.0)
Lymphocytes Relative: 25 % (ref 12–46)
Monocytes Absolute: 0.4 10*3/uL (ref 0.1–1.0)
Monocytes Relative: 6 % (ref 3–12)
NEUTROS PCT: 65 % (ref 43–77)
Neutro Abs: 4.2 10*3/uL (ref 1.7–7.7)

## 2014-05-25 LAB — COMPREHENSIVE METABOLIC PANEL
ALT: 49 U/L — ABNORMAL HIGH (ref 0–35)
AST: 56 U/L — ABNORMAL HIGH (ref 0–37)
Albumin: 1.9 g/dL — ABNORMAL LOW (ref 3.5–5.2)
Alkaline Phosphatase: 292 U/L — ABNORMAL HIGH (ref 39–117)
Anion gap: 5 (ref 5–15)
BUN: 20 mg/dL (ref 6–23)
CO2: 30 mmol/L (ref 19–32)
Calcium: 8 mg/dL — ABNORMAL LOW (ref 8.4–10.5)
Chloride: 98 mmol/L (ref 96–112)
Creatinine, Ser: 0.99 mg/dL (ref 0.50–1.10)
GFR calc Af Amer: 64 mL/min — ABNORMAL LOW (ref >90)
GFR calc non Af Amer: 55 mL/min — ABNORMAL LOW (ref >90)
Glucose, Bld: 181 mg/dL — ABNORMAL HIGH (ref 70–99)
Potassium: 3.5 mmol/L (ref 3.5–5.1)
Sodium: 133 mmol/L — ABNORMAL LOW (ref 135–145)
Total Bilirubin: 0.5 mg/dL (ref 0.3–1.2)
Total Protein: 5.4 g/dL — ABNORMAL LOW (ref 6.0–8.3)

## 2014-05-25 LAB — CBC
HCT: 30.3 % — ABNORMAL LOW (ref 36.0–46.0)
Hemoglobin: 10 g/dL — ABNORMAL LOW (ref 12.0–15.0)
MCH: 29.9 pg (ref 26.0–34.0)
MCHC: 33 g/dL (ref 30.0–36.0)
MCV: 90.7 fL (ref 78.0–100.0)
Platelets: 108 10*3/uL — ABNORMAL LOW (ref 150–400)
RBC: 3.34 MIL/uL — ABNORMAL LOW (ref 3.87–5.11)
RDW: 13.1 % (ref 11.5–15.5)
WBC: 6.4 10*3/uL (ref 4.0–10.5)

## 2014-05-25 LAB — MAGNESIUM: Magnesium: 1.6 mg/dL (ref 1.5–2.5)

## 2014-05-25 LAB — TRIGLYCERIDES: TRIGLYCERIDES: 288 mg/dL — AB (ref <150)

## 2014-05-25 LAB — PREALBUMIN: PREALBUMIN: 10.7 mg/dL — AB (ref 17.0–34.0)

## 2014-05-25 LAB — PHOSPHORUS: Phosphorus: 3.5 mg/dL (ref 2.3–4.6)

## 2014-05-25 MED ORDER — TRACE MINERALS CR-CU-F-FE-I-MN-MO-SE-ZN IV SOLN
INTRAVENOUS | Status: AC
  Administered 2014-05-25: 18:00:00 via INTRAVENOUS
  Filled 2014-05-25: qty 1440

## 2014-05-25 MED ORDER — METOCLOPRAMIDE HCL 5 MG/ML IJ SOLN
10.0000 mg | Freq: Four times a day (QID) | INTRAMUSCULAR | Status: DC
  Administered 2014-05-25 – 2014-05-29 (×15): 10 mg via INTRAVENOUS
  Filled 2014-05-25 (×16): qty 2

## 2014-05-25 NOTE — Progress Notes (Signed)
[facility] PROGRESS NOTE Subjective patient unfortunately began to vomit last night. Family notes that she is not had a bowel movement since coming into the hospital but is passing flatus.  Objective: Vital signs in last 24 hours: Temp:  [97.7 F (36.5 C)-98.8 F (37.1 C)] 98.8 F (37.1 C) (02/15 0955) Pulse Rate:  [107-125] 107 (02/15 0955) Resp:  [20-21] 20 (02/15 0955) BP: (121-154)/(65-74) 121/69 mmHg (02/15 0955) SpO2:  [94 %-100 %] 94 % (02/15 0955) Last BM Date: 05/16/14  Intake/Output from previous day: 02/14 0701 - 02/15 0700 In: 1318.3 [P.O.:100; IV Piggyback:390; TPN:828.3] Out: -  Intake/Output this shift:    PE: General--heavily sedated lethargic barely arousal white female  Lungs--clear Abdomen-- nondistended, soft and nontender with bowel sounds present  Lab Results:  Recent Labs  05/25/14 0430  WBC 6.4  HGB 10.0*  HCT 30.3*  PLT 108*   BMET  Recent Labs  05/29/2014 0400 05/25/14 0430  NA 134* 133*  K 3.5 3.5  CL 96 98  CO2 33* 30  CREATININE 0.81 0.99   LFT  Recent Labs  05/25/14 0430  PROT 5.4*  AST 56*  ALT 49*  ALKPHOS 292*  BILITOT 0.5   PT/INR No results for input(s): LABPROT, INR in the last 72 hours. PANCREAS No results for input(s): LIPASE in the last 72 hours.       Studies/Results: Dg Abd 2 Views  05-29-2014   CLINICAL DATA:  Constipation.  EXAM: ABDOMEN - 2 VIEW  COMPARISON:  05/15/2014  FINDINGS: Small amount of oral contrast seen within stomach and small bowel. No evidence of dilated bowel loops. No evidence of free intraperitoneal air. No significant stool burden noted.  IMPRESSION: Unremarkable bowel gas pattern.  No acute findings.   Electronically Signed   By: Earle Gell M.D.   On: 2014-05-29 16:44   Dg Swallowing Func-speech Pathology  05-29-14   Lorre Nick, [place]     05-29-2014  1:59 PM  Objective Swallowing Evaluation: Modified Barium Swallowing Study   Patient Details  Name: VANDI GUARENTE MRN: [id number] Date of Birth: 08-16-1940  Today's Date: 2014-05-29 Time: SLP Start Time (ACUTE ONLY): 1245-SLP Stop Time (ACUTE  ONLY): 1330 SLP Time Calculation (min) (ACUTE ONLY): 45 min  Past Medical History:  Past Medical History  Diagnosis Date  . Diabetes mellitus   . Obesity   . Diabetes type 2, uncontrolled   . Hypoglycemia associated with diabetes   . Edema of both legs   . Hypertension   . Combined hyperlipidemia   . Acquired autoimmune hypothyroidism   . Thyroiditis, autoimmune   . Abnormal liver function tests   . Sleep apnea, obstructive   . History of gastroesophageal reflux (GERD)   . Depression   . DM neuropathy with neurologic complication   . COPD with acute exacerbation   . Goiter   . Fatigue   . Vertigo   . Type II diabetes mellitus with peripheral angiopathy   . Gout   . Pallor    Past Surgical History:  Past Surgical History  Procedure Laterality Date  . Laparoscopic gastric banding    . Back surgery    . Craniotomy Right 05/08/2014    Procedure: CRANIOTOMY FOR MENINGIOMA;  Surgeon: Ashok Pall,  MD;  Location: [facility];  Service: Neurosurgery;  Laterality:  Right;  Right Craniotomy for meningioma   HPI:  HPI: 74 year old female admitted 05/03/14, who underwent  Craniotomy post Meningioma Resection. PMH significant for  GERD,  COPD, DM, Seizures, and VDRF. MBS ordered to assess readiness to  advance diet.  No Data Recorded  Assessment / Plan / Recommendation CHL IP CLINICAL IMPRESSIONS 05/23/2014  Dysphagia Diagnosis Moderate oral phase dysphagia;Moderate  pharyngeal phase dysphagia  Clinical impression Moderate oropharyngeal sensory and motor  based dysphagia, characterized by poor bolus formation and  reduced posterior propulsion orally, Delayed swallow reflex  across consistencies due to decreased sensation for primary  trigger site. Vallecular residue was noted across consistencies  as well, which was reduced with dry swallow and/or alternating  solids and liquids. Frank aspiration  of thin liquids was noted  during the swallow with delayed cough response. Pt was instructed  to take a small sip, but instead filled oral cavity with thin  barium. When bolus size was limited by SLP, pt tolerated thin  liquids without penetration or aspiration. At this time,  recommend puree diet with nectar thick liquids, crushed meds. Pt  may have 1-2 ice chips or 1-2 bites of Icee/Italian Ice with 1:1  supervision after oral care. ST to contnue to follow for  tolerance of advanced diet. Son present during evaluation, and  was agreeable to recommendations post-study.      CHL IP TREATMENT RECOMMENDATION 05/23/2014  Treatment Plan Recommendations Therapy as outlined in treatment  plan below     CHL IP DIET RECOMMENDATION 05/23/2014  Diet Recommendations Dysphagia 1 (Puree);Nectar-thick liquid  Liquid Administration via Straw;Cup  Medication Administration Crushed with puree  Compensations Slow rate;Small sips/bites;Follow solids with  liquid;Multiple dry swallows after each bite/sip  Postural Changes and/or Swallow Maneuvers Upright 30-60 min after  meal;Seated upright 90 degrees     CHL IP OTHER RECOMMENDATIONS 05/23/2014  Recommended Consults (None)  Oral Care Recommendations Oral care Q4 per protocol  Other Recommendations Order thickener from pharmacy     CHL IP FOLLOW UP RECOMMENDATIONS 05/23/2014  Follow up Recommendations Inpatient Rehab;24 hour  supervision/assistance    CHL IP FREQUENCY AND DURATION 05/23/2014  Speech Therapy Frequency (ACUTE ONLY) min 3x week  Treatment Duration 1 week     Pertinent Vitals/Pain Pt reported sore throat   SLP Swallow Goals No flowsheet data found.  No flowsheet data found.    CHL IP REASON FOR REFERRAL 05/23/2014  Reason for Referral Objectively evaluate swallowing function     CHL IP ORAL PHASE 05/23/2014  Lips (None)  Tongue (None)  Mucous membranes (None)  Nutritional status (None)  Other (None)  Oxygen therapy (None)  Oral Phase Impaired  Oral - Pudding Teaspoon (None) Oral -  Pudding Cup (None)  Oral - Honey Teaspoon (None)  Oral - Honey Cup (None) Oral - Honey Syringe (None)  Oral - Nectar Teaspoon (None)  Oral - Nectar Cup (None) Oral - Nectar Straw Weak lingual  manipulation;Reduced posterior propulsion;Piecemeal  swallowing;Delayed oral transit  Oral - Nectar Syringe (None)  Oral - Ice Chips (None)  Oral - Thin Teaspoon Weak lingual manipulation;Delayed oral  transit;Reduced posterior propulsion;Piecemeal swallowing  Oral - Thin Cup (None)  Oral - Thin Straw Delayed oral transit;Weak lingual  manipulation;Reduced posterior propulsion;Piecemeal swallowing  Oral - Thin Syringe (None)  Oral - Puree Piecemeal swallowing;Reduced posterior  propulsion;Delayed oral transit;Weak lingual manipulation  Oral - Mechanical Soft Piecemeal swallowing;Reduced posterior  propulsion;Delayed oral transit;Weak lingual manipulation  Oral - Regular (None)  Oral - Multi-consistency (None)  Oral - Pill (None) Oral Phase - Comment (None)      CHL IP PHARYNGEAL PHASE 05/23/2014  Pharyngeal Phase Impaired  Pharyngeal -  Pudding Teaspoon (None)  Penetration/Aspiration details (pudding teaspoon) (None)  Pharyngeal - Pudding Cup (None)  Penetration/Aspiration details (pudding cup) (None)  Pharyngeal - Honey Teaspoon (None)  Penetration/Aspiration details (honey teaspoon) (None)  Pharyngeal - Honey Cup (None)  Penetration/Aspiration details (honey cup) (None)  Pharyngeal - Honey Syringe (None)  Penetration/Aspiration details (honey syringe) (None)  Pharyngeal - Nectar Teaspoon (None)  Penetration/Aspiration details (nectar teaspoon) (None)  Pharyngeal - Nectar Cup (None)  Penetration/Aspiration details (nectar cup) (None)  Pharyngeal - Nectar Straw Delayed swallow initiation;Premature  spillage to valleculae;Reduced tongue base retraction;Pharyngeal  residue - valleculae;Compensatory strategies attempted (Comment)  Penetration/Aspiration details (nectar straw) (None)  Pharyngeal - Nectar Syringe (None)   Penetration/Aspiration details (nectar syringe) (None)  Pharyngeal - Ice Chips (None)  Penetration/Aspiration details (ice chips) (None)  Pharyngeal - Thin Teaspoon Delayed swallow initiation;Premature  spillage to pyriform sinuses;Premature spillage to  valleculae;Reduced tongue base retraction;Pharyngeal residue -  valleculae;Compensatory strategies attempted (Comment)  Penetration/Aspiration details (thin teaspoon) (None)  Pharyngeal - Thin Cup (None)  Penetration/Aspiration details (thin cup) (None)  Pharyngeal - Thin Straw Delayed swallow initiation;Premature  spillage to valleculae;Premature spillage to pyriform  sinuses;Reduced tongue base retraction;Pharyngeal residue -  valleculae;Compensatory strategies attempted  (Comment);Penetration/Aspiration during swallow;Moderate  aspiration;Reduced airway/laryngeal closure  Penetration/Aspiration details (thin straw) Material enters  airway, passes BELOW cords and not ejected out despite cough  attempt by patient  Pharyngeal - Thin Syringe (None)  Penetration/Aspiration details (thin syringe') (None)  Pharyngeal - Puree Delayed swallow initiation;Premature spillage  to valleculae;Reduced tongue base retraction;Pharyngeal residue -  valleculae;Compensatory strategies attempted (Comment)  Penetration/Aspiration details (puree) (None)  Pharyngeal - Mechanical Soft Delayed swallow initiation;Premature  spillage to valleculae;Reduced tongue base retraction;Pharyngeal  residue - valleculae;Compensatory strategies attempted (Comment)  Penetration/Aspiration details (mechanical soft) (None)  Pharyngeal - Regular (None)  Penetration/Aspiration details (regular) (None)  Pharyngeal - Multi-consistency (None)  Penetration/Aspiration details (multi-consistency) (None)  Pharyngeal - Pill (None)  Penetration/Aspiration details (pill) (None)  Pharyngeal Comment (None)     CHL IP CERVICAL ESOPHAGEAL PHASE 05/23/2014  Cervical Esophageal Phase (No Data)  Pudding Teaspoon (None)   Pudding Cup (None)  Honey Teaspoon (None)  Honey Cup (None)  Honey Syringe (None)  Nectar Teaspoon (None)  Nectar Cup (None)  Nectar Straw (None) Nectar Syringe (None)  Thin Teaspoon (None)  Thin Cup (None)  Thin Straw (None)  Thin Syringe (None)  Cervical Esophageal Comment (None)    No flowsheet data found.  Celia B. Quentin Ore [facility], CCC-SLP [id number]       Shonna Chock 05/23/2014, 1:58 PM     Medications: I have reviewed the patient's current medications.  Assessment/Plan: 1. N+V 2. OroPharyngeal Dysphagia  The patient is currently on Reglan 10 mg IV BID. I will plan to increase the Reglan to QID to see if this helps. If nausea and vomiting continues to be a problem we may need to consider placement of nasal duodenal tube to initiate nasal duodenal tube feeding. If the patient continues to be unable to take an adequate oral intake, a more permanent and [place] access such as PEG tube may be needed   Aarna Mihalko JR,Kazimierz Springborn L 05/25/2014, 10:12 AM

## 2014-05-25 NOTE — Progress Notes (Signed)
UR COMPLETED  

## 2014-05-25 NOTE — Progress Notes (Addendum)
PARENTERAL NUTRITION CONSULT NOTE - FOLLOW UP  Pharmacy Consult for TPN Indication: Bowel rest  Allergies  Allergen Reactions  . Latex Rash  . Sulfa Antibiotics Other (See Comments)    Unknown allergic reaction  . Penicillins Rash    Patient Measurements: Height: 5\' 4"  (162.6 cm) Weight: 221 lb 1.9 oz (100.3 kg) IBW/kg (Calculated) : 54.7 Adjusted Body Weight:  Usual Weight:   Vital Signs: Temp: 98.8 F (37.1 C) (02/15 0955) Temp Source: Axillary (02/15 0955) BP: 121/69 mmHg (02/15 0955) Pulse Rate: 107 (02/15 0955) Intake/Output from previous day: 02/14 0701 - 02/15 0700 In: 1318.3 [P.O.:100; IV Piggyback:390; TPN:828.3] Out: -  Intake/Output from this shift:    Labs:  Recent Labs  05/25/14 0430  WBC 6.4  HGB 10.0*  HCT 30.3*  PLT 108*     Recent Labs  05/23/14 0400 05/25/14 0430  NA 134* 133*  K 3.5 3.5  CL 96 98  CO2 33* 30  GLUCOSE 191* 181*  BUN 14 20  CREATININE 0.81 0.99  CALCIUM 7.8* 8.0*  MG 1.6 1.6  PHOS 3.1 3.5  PROT  --  5.4*  ALBUMIN  --  1.9*  AST  --  56*  ALT  --  49*  ALKPHOS  --  292*  BILITOT  --  0.5  TRIG  --  288*   Estimated Creatinine Clearance: 58.2 mL/min (by C-G formula based on Cr of 0.99).    Recent Labs  05/24/14 2305 05/25/14 0323 05/25/14 0753  GLUCAP 160* 174* 154*    Medications:  Scheduled:  . anastrozole  1 mg Oral Daily  . cloNIDine  0.3 mg Transdermal Weekly  . colchicine  0.6 mg Oral BID  . DULoxetine  60 mg Oral BID  . furosemide  40 mg Oral BID  . guaiFENesin  600 mg Oral BID  . heparin subcutaneous  5,000 Units Subcutaneous 3 times per day  . insulin aspart  0-20 Units Subcutaneous 6 times per day  . lacosamide (VIMPAT) IV  200 mg Intravenous Q12H  . levETIRAcetam  500 mg Intravenous 3 times per day  . levothyroxine  200 mcg Oral Once per day on Mon Tue Wed Thu Fri Sat  . levothyroxine  300 mcg Oral Once every 7 days  . Liraglutide  1.8 mg Subcutaneous Daily  . metoCLOPramide  (REGLAN) injection  10 mg Intravenous 4 times per day  . mometasone-formoterol  2 puff Inhalation BID  . pantoprazole (PROTONIX) IV  40 mg Intravenous Q12H  . polyethylene glycol  17 g Oral BID  . scopolamine  1 patch Transdermal Q72H    Insulin Requirements in the past 24 hours:  15 units resistant SSI since new TPN + Victoza 1.8 mg daily + 65 units regular insulin in TPN  Assessment: 73 YOF presented on 05/03/14 with AMS and left-sided weakness. 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 Patient did not tolerate PO intake post-op due to persistent nausea and vomiting. Unable to use GI tract per Gertie Fey and Pharmacy consulted to manage TPN for nutritional support. She is at risk for refeeding syndrome.  GI: obesity / GERD. 17 lb weight loss in past week; baseline prealbumin low at 12.1. Persistent Nausea, (+)vomiting, cont'd constipation - NGT removed but unable to tolerate PO med. EGD performed 2/12 - essentially normal. Dysphasia I diet started on 2/13. Miralax, Reglan, multivitamin, PPI PO   Endo: hypothyroidism on Synthroid. DM2 on U-500 PTA. CBGs uncontrolled prior to TPN  initiation.  CBGs 143-160 since inc'd insulin last PM, much improved. Has 65 units of insulin in TPN bag.  Lytes: K 3.5, Mg 1.6, Phos 3.5, Ca 8 (corrected 9.7)  Renal:  SCr stable, CrCL 70 ml/min.  UOP x 3  Pulm:  COPD - RA , has thick secretions - Dulera, guaifenesin   Cards: HTN / HLD - BP mildly elevated, HR elevated - clonidine TTS, Lasix  Hepatobil: lipase / amylase / TG WNL at baseline. AlkPhos 292- inc, o/w LFTs stable.  TG 288- large jump  Heme/Onc: Hg 10, plts 108- dec from 152 on 2/10 - anastrozole, Hep SQ (2/12 >> )  Neuro: depression / neuropathy. Lethargic, arousable.  New right frontal meningioma causing seizure, s/p resection on 1/29 - Vimpat, Keppra, scopolamine patch, receiving PRN Dilaudid   ID:  afebrile, WBC decreasing - not on abx  Best  Practices:  SCDs- currently refusing, Heparin SQ (2/12 >> ) TPN Access: right IJ placed 05/17/14 TPN day#: 5 (2/9 >> )  Current Nutrition:  TPN  Nutritional Goals:  1700-1900 kCal, 95-110grams of protein per day   Plan:  - Continue Clinimix E 5/15 to 60 ml/hr (goal 83 ml/hr) & decrease IVFE at 10 ml/hr to MWF only given jump in TG to provide 72g protein daily and average 1236 KCal/day.  Will not give lipids today, next on 05/27/14 - Daily multivitamin and trace elements - Continue resistant SSI Q4H + Victoza +  insulin in TPN to 65 units (~33 units/L) - F/U diet tolerance - Consider starting IV Lopressor for better BP control (on felodipine, Toprol and lisinopril PTA) - TPN labs in AM -  CBC in AM- watch pltc, consider HIT if drops again  Gracy Bruins, PharmD [facility]

## 2014-05-25 NOTE — Progress Notes (Signed)
Chaplain initiated visit with pt and family. Pt sleeping, chaplain met with pt daughter Morgan [name]. Pt daughter described the difficulty family has had since November, but equally the incredible support they have received from pastors and church family. Chaplain facilitated storytelling, provided emotional support, and offered prayer. Chaplain will continue to follow. Page chaplain if needed before follow up.   05/25/14 1500  Clinical Encounter Type  Visited With Family  Visit Type Initial;Spiritual support  Recommendations Follow Up  Spiritual Encounters  Spiritual Needs Emotional;Prayer  Stress Factors  Family Stress Factors Major life changes  Morgan [name], Barbette Hair, Chaplain 05/25/2014 3:05 PM

## 2014-05-25 NOTE — Progress Notes (Signed)
Physical Therapy Treatment Patient Details Name: Morgan [name] MRN: [id number] DOB: [birth year] Today's Date: 05/25/2014    History of Present Illness pt presents with Craniotomy post Meningioma Resection, Seizures, and VDRF.      PT Comments    Pt. Made progress today with sit to stand lift and stand and pivot transfer. Pt. Would benefit from transfer training without STEADY to increase her load on legs for strengthening. Pt. Deficits evident are: sitting and standing posture, arousal and strength in her legs. There was buckling evident during the SPT. Pt. Is a good candidate for CIR when medically stable to work on her deficits.    Follow Up Recommendations  CIR     Equipment Recommendations   (TBD by PT)    Recommendations for Other Services Rehab consult     Precautions / Restrictions Precautions Precautions: Fall Restrictions Weight Bearing Restrictions: No    Mobility  Bed Mobility Overal bed mobility: Needs Assistance Bed Mobility: Rolling;Sidelying to Sit Rolling: Modified independent (Device/Increase time) Sidelying to sit: Modified independent (Device/Increase time)       General bed mobility comments: HOB mildly elevated. increased time and reliant on hand rails to bring trunk upright. vc's for bring hips to EOB and feet placement. tactile cues for posture  Transfers Overall transfer level: Needs assistance Equipment used: 2 person hand held assist Transfers: Sit to/from [facility] Sit to Stand: Min assist;+2 physical assistance Stand pivot transfers: +2 physical assistance;Mod assist       General transfer comment: Showed increased leg strength in order to come up to stand with min A +2; mod A +2 for SPT with tactile and vc's to shuffle feet into position  Ambulation/Gait                 Stairs            Wheelchair Mobility    Modified Rankin (Stroke Patients Only)       Balance Overall balance assessment: Needs  assistance Sitting-balance support: Feet supported;Bilateral upper extremity supported Sitting balance-Leahy Scale: Poor Sitting balance - Comments: needed tactile and vc's to maintain upright posture and lift head, open eyes. pt. lethargic Postural control: Left lateral lean Standing balance support: Bilateral upper extremity supported;During functional activity Standing balance-Leahy Scale: Poor Standing balance comment: patient requires hold to maintain balance                    Cognition Arousal/Alertness: Lethargic Behavior During Therapy: Flat affect Overall Cognitive Status: Difficult to assess Area of Impairment: Attention   Current Attention Level: Focused   Following Commands: Follows one step commands with increased time       General Comments: pt. level of arousal improved on EOB    Exercises      General Comments General comments (skin integrity, edema, etc.): patient had nausea before and threw up at the end of treatment session; nurse was in the room addressing when left      Pertinent Vitals/Pain Pain Score: 4  Pain Location: throat Pain Descriptors / Indicators: Aching Pain Intervention(s): Limited activity within patient's tolerance;Monitored during session;Repositioned    Home Living                      Prior Function            PT Goals (current goals can now be found in the care plan section) Progress towards PT goals: Progressing toward goals    Frequency  Min  3X/week    PT Plan Current plan remains appropriate    Co-evaluation             End of Session Equipment Utilized During Treatment: Gait belt Activity Tolerance: Patient limited by lethargy Patient left: in chair;with call bell/phone within reach;with family/visitor present     Time: 1410-1430 PT Time Calculation (min) (ACUTE ONLY): 20 min  Charges:  $Therapeutic Activity: 8-22 mins                    G Codes:      Jodi Geralds, [place] 05/25/2014,  3:44 PM

## 2014-05-26 LAB — GLUCOSE, CAPILLARY
GLUCOSE-CAPILLARY: 145 mg/dL — AB (ref 70–99)
GLUCOSE-CAPILLARY: 184 mg/dL — AB (ref 70–99)
GLUCOSE-CAPILLARY: 207 mg/dL — AB (ref 70–99)
GLUCOSE-CAPILLARY: 213 mg/dL — AB (ref 70–99)
Glucose-Capillary: 113 mg/dL — ABNORMAL HIGH (ref 70–99)
Glucose-Capillary: 174 mg/dL — ABNORMAL HIGH (ref 70–99)

## 2014-05-26 LAB — BASIC METABOLIC PANEL
Anion gap: 7 (ref 5–15)
BUN: 25 mg/dL — ABNORMAL HIGH (ref 6–23)
CO2: 30 mmol/L (ref 19–32)
Calcium: 7.9 mg/dL — ABNORMAL LOW (ref 8.4–10.5)
Chloride: 97 mmol/L (ref 96–112)
Creatinine, Ser: 1.1 mg/dL (ref 0.50–1.10)
GFR calc non Af Amer: 49 mL/min — ABNORMAL LOW (ref >90)
GFR, EST AFRICAN AMERICAN: 56 mL/min — AB (ref >90)
Glucose, Bld: 122 mg/dL — ABNORMAL HIGH (ref 70–99)
Potassium: 3.5 mmol/L (ref 3.5–5.1)
Sodium: 134 mmol/L — ABNORMAL LOW (ref 135–145)

## 2014-05-26 LAB — CBC
HCT: 28.6 % — ABNORMAL LOW (ref 36.0–46.0)
Hemoglobin: 9.5 g/dL — ABNORMAL LOW (ref 12.0–15.0)
MCH: 29.9 pg (ref 26.0–34.0)
MCHC: 33.2 g/dL (ref 30.0–36.0)
MCV: 89.9 fL (ref 78.0–100.0)
Platelets: 108 10*3/uL — ABNORMAL LOW (ref 150–400)
RBC: 3.18 MIL/uL — ABNORMAL LOW (ref 3.87–5.11)
RDW: 13.5 % (ref 11.5–15.5)
WBC: 6.2 10*3/uL (ref 4.0–10.5)

## 2014-05-26 MED ORDER — TRACE MINERALS CR-CU-F-FE-I-MN-MO-SE-ZN IV SOLN
INTRAVENOUS | Status: AC
  Administered 2014-05-26: 18:00:00 via INTRAVENOUS
  Filled 2014-05-26: qty 1992

## 2014-05-26 MED ORDER — CETYLPYRIDINIUM CHLORIDE 0.05 % MT LIQD
7.0000 mL | Freq: Two times a day (BID) | OROMUCOSAL | Status: DC
  Administered 2014-05-26 – 2014-05-29 (×6): 7 mL via OROMUCOSAL

## 2014-05-26 MED ORDER — BISACODYL 10 MG RE SUPP
10.0000 mg | Freq: Every day | RECTAL | Status: DC | PRN
Start: 2014-05-26 — End: 2014-05-29
  Filled 2014-05-26: qty 1

## 2014-05-26 MED ORDER — FAT EMULSION 20 % IV EMUL
240.0000 mL | INTRAVENOUS | Status: AC
  Administered 2014-05-26: 240 mL via INTRAVENOUS
  Filled 2014-05-26: qty 250

## 2014-05-26 NOTE — Progress Notes (Signed)
Pt just ate.  Will hold on CPT

## 2014-05-26 NOTE — Progress Notes (Addendum)
PARENTERAL NUTRITION CONSULT NOTE - FOLLOW UP  Pharmacy Consult for TPN Indication: Bowel rest  Allergies  Allergen Reactions  . Latex Rash  . Sulfa Antibiotics Other (See Comments)    Unknown allergic reaction  . Penicillins Rash    Patient Measurements: Height: 5\' 4"  (162.6 cm) Weight: 221 lb 1.9 oz (100.3 kg) IBW/kg (Calculated) : 54.7 Adjusted Body Weight:  Usual Weight:   Vital Signs: Temp: 98 F (36.7 C) (02/16 0932) Temp Source: Oral (02/16 0932) BP: 121/99 mmHg (02/16 0932) Pulse Rate: 108 (02/16 0932) Intake/Output from previous day:   Intake/Output from this shift:    Labs:  Recent Labs  05/25/14 0430 05/26/14 0549  WBC 6.4 6.2  HGB 10.0* 9.5*  HCT 30.3* 28.6*  PLT 108* 108*     Recent Labs  05/25/14 0430 05/26/14 0549  NA 133* 134*  K 3.5 3.5  CL 98 97  CO2 30 30  GLUCOSE 181* 122*  BUN 20 25*  CREATININE 0.99 1.10  CALCIUM 8.0* 7.9*  MG 1.6  --   PHOS 3.5  --   PROT 5.4*  --   ALBUMIN 1.9*  --   AST 56*  --   ALT 49*  --   ALKPHOS 292*  --   BILITOT 0.5  --   PREALBUMIN 10.7*  --   TRIG 288*  --    Estimated Creatinine Clearance: 52.4 mL/min (by C-G formula based on Cr of 1.1).    Recent Labs  05/26/14 0013 05/26/14 0412 05/26/14 0756  GLUCAP 213* 145* 113*    Medications:  Scheduled:  . anastrozole  1 mg Oral Daily  . cloNIDine  0.3 mg Transdermal Weekly  . colchicine  0.6 mg Oral BID  . DULoxetine  60 mg Oral BID  . furosemide  40 mg Oral BID  . guaiFENesin  600 mg Oral BID  . heparin subcutaneous  5,000 Units Subcutaneous 3 times per day  . insulin aspart  0-20 Units Subcutaneous 6 times per day  . lacosamide (VIMPAT) IV  200 mg Intravenous Q12H  . levETIRAcetam  500 mg Intravenous 3 times per day  . levothyroxine  200 mcg Oral Once per day on Mon Tue Wed Thu Fri Sat  . levothyroxine  300 mcg Oral Once every 7 days  . Liraglutide  1.8 mg Subcutaneous Daily  . metoCLOPramide (REGLAN) injection  10 mg  Intravenous 4 times per day  . mometasone-formoterol  2 puff Inhalation BID  . pantoprazole (PROTONIX) IV  40 mg Intravenous Q12H  . polyethylene glycol  17 g Oral BID  . scopolamine  1 patch Transdermal Q72H    Insulin Requirements in the past 24 hours:  14 units resistant SSI + Victoza 1.8 mg daily + 65 units regular insulin in TPN  Nutritional Goals: Kcal: 1700-1950 Protein: 95-110 grams  Current nutrition: Dys I diet and TPN  Assessment: 77 YOF presented on 05/03/14 with AMS and left-sided weakness. 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 Patient did not tolerate PO intake post-op due to persistent nausea and vomiting. Unable to use GI tract per Gertie Fey and Pharmacy consulted to manage TPN for nutritional support. She is at risk for refeeding syndrome.  GI: obesity / GERD. 17 lb weight loss in past week; baseline prealbumin low at 12.1. Persistent Nausea, (+)vomiting, cont'd constipation - NGT removed but unable to tolerate PO med. EGD performed 2/12 - essentially normal. Dysphasia I diet started on 2/13.  No PO  intake recorded but cbgs up today- spoke with RN & pt taking ice pops but not really eating; hoping relief of constipation will help with appetite (LBM 2/6). Overall not receiving many opioid doses, on Scopolamine patch as well which may contribute.  Plan to start enemas (no orders yet).  Miralax BID (2/8 >> ), Reglan IV q6 (2/15 >> ), MVI, PPI PO   Endo: hypothyroidism on Synthroid. DM2 on U-500 PTA. CBGs uncontrolled prior to TPN initiation. CBGs [id number].  Lytes: K 3.5  Renal: SCr stable, CrCL 70 ml/min. UOP x 2  Pulm: COPD - RA , has thick secretions - Dulera, guaifenesin   Cards: HTN / HLD - BP & HR elevated - clonidine TTS, Lasix  Hepatobil: lipase / amylase / TG WNL at baseline. AlkPhos 292- inc, o/w LFTs stable. TG 288- large jump  Heme/Onc: Hg 10, plts 108- dec from 152 on 2/10 - anastrozole, Hep SQ (2/12 >>  )  Neuro: depression / neuropathy. Lethargic, arousable. New right frontal meningioma causing seizure, s/p resection on 1/29 - Duloxetine, Vimpat, Keppra, scopolamine patch, receiving PRN Dilaudid   ID: afebrile, WBC decreasing - not on abx  Best Practices: SCDs- currently refusing, Heparin SQ (2/12 >> ) TPN Access: right IJ placed 05/17/14 TPN day#: 5 (2/9 >> )  Current Nutrition:  TPN  Nutritional Goals:  1700-1900 kCal, 95-110grams of protein per day   Plan:  - Advance Clinimix E 5/15 to goal 83 ml/hr & IVFE 10 ml/hr to provide 99g protein & 1894kcal daily, meeting 100% needs. - Daily multivitamin and trace elements - Continue resistant SSI Q4H + Victoza + insulin in TPN to 65 units (~33 units/L) - F/U diet tolerance  Gracy Bruins, PharmD Clinical Pharmacist [facility]

## 2014-05-26 NOTE — Progress Notes (Addendum)
Speech Language Pathology Treatment: Dysphagia  Patient Details Name: Morgan [name] MRN: [id number] DOB: 1940/04/18 Today's Date: 05/26/2014 Time: [id number] SLP Time Calculation (min) (ACUTE ONLY): 48 min  Assessment / Plan / Recommendation Clinical Impression  Pt seen for f/u dysphagia treatment with focus on diet tolerance, readiness for advancement, and pt/family education with son present and wondering when pt will be able to advance POs. Pt more alert today and engaged in self-feeding task. Thin liquid boluses included bites of [place] ice as well as straw sips of water. Self-fed trials elicited immediate and delayed cough response, although thin liquid trials by straw with Max A from SLP to monitor rate of intake did not yield overt signs of aspiration. Self-fed trials of nectar thick liquids, despite impulsivity, also did not yield signs of aspiration. Oral phase was labored with regular textures with prolonged mastication and formation with very small bites of cracker. Recommend advancement to Dys 2 diet, continuing nectar thick liquids. Thorough education was provided to patient and son regarding the risks of aspiration and rationale for current recommendations. SLP to continue to follow.  ADDENDUM (15:50): RN contacted SLP for patient, who is requesting thin liquids. Recommendations for liquids remain consistent with recent MBS: nectar thick liquids per diet order with a few ice chips/small bites of [place] ice at a time following oral care.    HPI HPI: 74 year old female admitted 05/03/14, who underwent Craniotomy post Meningioma Resection. PMH significant for GERD, COPD, DM, Seizures, and VDRF. MBS ordered to assess readiness to advance diet.   Pertinent Vitals Pain Assessment: No/denies pain  SLP Plan  Continue with current plan of care    Recommendations Diet recommendations: Dysphagia 2 (fine chop);Nectar-thick liquid Liquids provided via: Cup;Straw Medication Administration:  Crushed with puree Supervision: Patient able to self feed;Full supervision/cueing for compensatory strategies Compensations: Slow rate;Small sips/bites;Follow solids with liquid;Multiple dry swallows after each bite/sip Postural Changes and/or Swallow Maneuvers: Upright 30-60 min after meal;Seated upright 90 degrees       Oral Care Recommendations: Oral care BID Follow up Recommendations: Inpatient Rehab;24 hour supervision/assistance Plan: Continue with current plan of care    GO      Germain Osgood, M.A. CCC-SLP [phone]  Germain Osgood 05/26/2014, 2:49 PM

## 2014-05-26 NOTE — Progress Notes (Signed)
NUTRITION FOLLOW UP  Intervention:   TPN per pharmacy Encourage PO intake as tolerated RD to continue to monitor  Recommended TF regimen if/when applicable: provide Vital AF 1.2 via NJ-tube @ 20 ml/hr and increasing by 10 ml/hr every 8 hours to goal rate of 60 ml/hr. This will provide 1728 kcal, 108 grams of protein, and 1168 ml of water.   Nutrition Dx:   Inadequate oral intake related to nausea and vomiting as evidenced by pt's report and 3 lb weight loss; ongoing  New Goal: Pt to meet >/= 90% of their estimated nutrition needs; unmet/progressing  Monitor:   PO intake, supplement acceptance, weight trend, labs  Assessment:   Pt admitted 1/24 with AMS, found to have seizures in the setting of meningioma. S/p resection 1/29 and remained intubated.   Pt's diet was advanced Friday afternoon, now on Dysphagia 1 with nectar-thick liquids. Pt occupied at time of visit. Per chart pt continued to have nausea and vomiting over the weekend; Per MD note pt ate a small amount of grits for breakfast without vomiting. Still no BM- plan for enema therapy and Miralax.  Pt is currently receiving Clinimix E 5/15 at 60 ml/hr with plans to advance to goal of 83 ml/hr + IVFE at 10 ml/hr MWF. TPN being advanced slowly due to refeeding risk and uncontrolled blood sugar. Current TPN regimen meets 73% of estimated energy needs and 76% of estimated protein needs. Glucose labs have been trending down.  Pt's weight has dropped 17 lbs since admission.   Labs: elevated glucose improving, low hemoglobin, low calcium, elevated triglycerides; magnesium and phosphorus WNL  Height: Ht Readings from Last 1 Encounters:  05/08/14 5\' 4"  (1.626 m)    Weight Status:   Wt Readings from Last 1 Encounters:  05/19/14 221 lb 1.9 oz (100.3 kg)  05/12/14 238 lbs  Re-estimated needs:  Kcal: 1700-1950 Protein: 95-110 grams Fluid: 2.8 L/day  Skin: closed incision on head; +1 facial edema and RUE edema, +1 generalized  edema  Diet Order: DIET - DYS 1 TPN (CLINIMIX-E) Adult TPN (CLINIMIX-E) Adult  No intake or output data in the 24 hours ending 05/26/14 1237  Last BM: 2/15- smear; constipation   Labs:   Recent Labs Lab 05/22/14 0555 05/23/14 0400 05/25/14 0430 05/26/14 0549  NA 136 134* 133* 134*  K 3.5 3.5 3.5 3.5  CL 100 96 98 97  CO2 30 33* 30 30  BUN 15 14 20  25*  CREATININE 0.78 0.81 0.99 1.10  CALCIUM 8.0* 7.8* 8.0* 7.9*  MG 1.9 1.6 1.6  --   PHOS 3.3 3.1 3.5  --   GLUCOSE 131* 191* 181* 122*    CBG (last 3)   Recent Labs  05/26/14 0412 05/26/14 0756 05/26/14 1112  GLUCAP 145* 113* 174*    Scheduled Meds: . anastrozole  1 mg Oral Daily  . cloNIDine  0.3 mg Transdermal Weekly  . colchicine  0.6 mg Oral BID  . DULoxetine  60 mg Oral BID  . furosemide  40 mg Oral BID  . guaiFENesin  600 mg Oral BID  . heparin subcutaneous  5,000 Units Subcutaneous 3 times per day  . insulin aspart  0-20 Units Subcutaneous 6 times per day  . lacosamide (VIMPAT) IV  200 mg Intravenous Q12H  . levETIRAcetam  500 mg Intravenous 3 times per day  . levothyroxine  200 mcg Oral Once per day on Mon Tue Wed Thu Fri Sat  . levothyroxine  300 mcg Oral Once  every 7 days  . Liraglutide  1.8 mg Subcutaneous Daily  . metoCLOPramide (REGLAN) injection  10 mg Intravenous 4 times per day  . mometasone-formoterol  2 puff Inhalation BID  . pantoprazole (PROTONIX) IV  40 mg Intravenous Q12H  . polyethylene glycol  17 g Oral BID  . scopolamine  1 patch Transdermal Q72H    Continuous Infusions: . sodium chloride 10 mL/hr at 05/26/14 1056  . Marland KitchenTPN (CLINIMIX-E) Adult     And  . fat emulsion    . Marland KitchenTPN (CLINIMIX-E) Adult 60 mL/hr at 05/25/14 [place], LDN Inpatient Clinical Dietitian Pager: [phone] After Hours Pager: [phone]

## 2014-05-26 NOTE — Progress Notes (Signed)
[facility] PROGRESS NOTE Subjective patient had grits for breakfast without vomiting but do not eat very much. She is passing flatus but still has not had bowel movement  Objective: Vital signs in last 24 hours: Temp:  [97.6 F (36.4 C)-98.1 F (36.7 C)] 98 F (36.7 C) (02/16 0932) Pulse Rate:  [108-120] 108 (02/16 0932) Resp:  [16-20] 20 (02/16 0932) BP: (101-130)/(54-99) 121/99 mmHg (02/16 0932) SpO2:  [93 %-98 %] 94 % (02/16 0932) Last BM Date: 05/25/14 ((smear), no full BM)  Intake/Output from previous day:   Intake/Output this shift:    PE: General--patient coughing and using [place]. Her eyes are closed that she is responsive - Abdomen-- none distended and soft, nontender with positive bowel sounds  Lab Results:  Recent Labs  05/25/14 0430 05/26/14 0549  WBC 6.4 6.2  HGB 10.0* 9.5*  HCT 30.3* 28.6*  PLT 108* 108*   BMET  Recent Labs  05/25/14 0430 05/26/14 0549  NA 133* 134*  K 3.5 3.5  CL 98 97  CO2 30 30  CREATININE 0.99 1.10   LFT  Recent Labs  05/25/14 0430  PROT 5.4*  AST 56*  ALT 49*  ALKPHOS 292*  BILITOT 0.5   PT/INR No results for input(s): LABPROT, INR in the last 72 hours. PANCREAS No results for input(s): LIPASE in the last 72 hours.       Studies/Results: No results found.  Medications: I have reviewed the patient's current medications.  Assessment/Plan: 1. Nausea and vomiting/inability to eat. Difficult to tell how much of this is due to her neurological surgery. 2. Constipation. Her family she has not had bowel movement now in 3 weeks. We will start with enema therapy and hopefully will be able to continue with Miralax.   Joane Postel JR,Mykaela Arena L 05/26/2014, 11:26 AM

## 2014-05-26 NOTE — Progress Notes (Signed)
Pt has been complaining of nausea. Chest vest not done at this time.

## 2014-05-26 NOTE — Progress Notes (Signed)
Patient given soap suds enema. Patient had a medium-large soft bowel movement. Will continue to monitor patient.

## 2014-05-26 NOTE — Progress Notes (Signed)
OT Cancellation Note  Patient Details Name: Morgan [name] MRN: [id number] DOB: 1940-09-28   Cancelled Treatment:    Reason Eval/Treat Not Completed: Other (comment) Pt working with [employer]. Will return later if able. [place], OTR/L  [phone] 05/26/2014 05/26/2014, 2:15 PM

## 2014-05-26 NOTE — Progress Notes (Signed)
Patient ID: Morgan [name], female   DOB: 05-08-1940, 74 y.o.   MRN: [id number] BP 121/99 mmHg  Pulse 108  Temp(Src) 98 F (36.7 C) (Oral)  Resp 20  Ht 5' 4"  (1.626 m)  Wt 100.3 kg (221 lb 1.9 oz)  BMI 37.94 kg/m2  SpO2 94% No emesis today. Complaining of nausea No connection between nausea and vomiting to the cranial surgery. She does not have cerebral edema at this time.

## 2014-05-26 NOTE — Progress Notes (Signed)
Stopped by in AM to do cpt  Pt.  Had just eaten.  At [place] in to do the CPT again but pt was on bed pan and had just had swallowing test done with lots of fluid.  Sats on RA was 97.  And BS were clear.  Did not do CPT at this time

## 2014-05-27 LAB — GLUCOSE, CAPILLARY
Glucose-Capillary: 172 mg/dL — ABNORMAL HIGH (ref 70–99)
Glucose-Capillary: 173 mg/dL — ABNORMAL HIGH (ref 70–99)
Glucose-Capillary: 188 mg/dL — ABNORMAL HIGH (ref 70–99)
Glucose-Capillary: 200 mg/dL — ABNORMAL HIGH (ref 70–99)
Glucose-Capillary: 236 mg/dL — ABNORMAL HIGH (ref 70–99)
Glucose-Capillary: 264 mg/dL — ABNORMAL HIGH (ref 70–99)

## 2014-05-27 MED ORDER — LACTATED RINGERS IV SOLN
INTRAVENOUS | Status: DC
  Administered 2014-05-27: 20:00:00 via INTRAVENOUS

## 2014-05-27 MED ORDER — TRACE MINERALS CR-CU-F-FE-I-MN-MO-SE-ZN IV SOLN
INTRAVENOUS | Status: DC
  Filled 2014-05-27: qty 1992

## 2014-05-27 MED ORDER — TRACE MINERALS CR-CU-F-FE-I-MN-MO-SE-ZN IV SOLN
INTRAVENOUS | Status: AC
  Filled 2014-05-27: qty 1992

## 2014-05-27 MED ORDER — FAT EMULSION 20 % IV EMUL
240.0000 mL | INTRAVENOUS | Status: DC
  Filled 2014-05-27: qty 250

## 2014-05-27 MED ORDER — FAT EMULSION 20 % IV EMUL
240.0000 mL | INTRAVENOUS | Status: AC
  Filled 2014-05-27: qty 250

## 2014-05-27 NOTE — Progress Notes (Signed)
Physical Therapy Treatment Patient Details Name: Morgan [name] MRN: [id number] DOB: 03/15/1941 Today's Date: 05/27/2014    History of Present Illness pt presents with Craniotomy post Meningioma Resection, Seizures, and VDRF.      PT Comments    Pt. Limited by lethargy but agreeable to light exercise. Able to do exercise against manual resistance so there is evidence of muscle strength. Next session try weightbearing on LEs and transfer training. Pt. Still appropriate for CIR when medically stable to work on deficits including: arousal, strengthening, postural control and transfers and gait.  Follow Up Recommendations  CIR     Equipment Recommendations       Recommendations for Other Services Rehab consult     Precautions / Restrictions Precautions Precautions: Fall Restrictions Weight Bearing Restrictions: No    Mobility  Bed Mobility Overal bed mobility: Needs Assistance Bed Mobility: Rolling;Sidelying to Sit;Sit to Sidelying Rolling: Modified independent (Device/Increase time) Sidelying to sit: Min guard     Sit to sidelying: Mod assist;+2 for physical assistance General bed mobility comments: mod assist +2 to reposition in bed. attempted to pull herself up in bed from ooklying-unable to; tactile cues for posture on EOB and could self scoot her hips out.for exercises  Transfers                 General transfer comment: did not transfer today due to lethargy  Ambulation/Gait                 Stairs            Wheelchair Mobility    Modified Rankin (Stroke Patients Only)       Balance Overall balance assessment: Needs assistance Sitting-balance support: Bilateral upper extremity supported;Feet supported Sitting balance-Leahy Scale: Poor Sitting balance - Comments: needed tactile and vc's to maintain upright posture and lift head, open eyes. pt. lethargic                            Cognition Arousal/Alertness:  Lethargic Behavior During Therapy: Flat affect Overall Cognitive Status: Difficult to assess Area of Impairment: Attention   Current Attention Level: Focused   Following Commands: Follows one step commands with increased time       General Comments: pt. level of arousal improved on EOB    Exercises General Exercises - Upper Extremity Shoulder Flexion: Seated;AROM;20 reps Shoulder ABduction: AROM;Seated;20 reps General Exercises - Lower Extremity Long Arc Quad: AROM;Seated;20 reps Hip Flexion/Marching: AROM;20 reps;Seated Toe Raises: AROM;10 reps;Seated    General Comments General comments (skin integrity, edema, etc.): patient felt sick at end of session; provided an emesis basin      Pertinent Vitals/Pain Pain Assessment: Faces Faces Pain Scale: Hurts little more Pain Location: left shoulder Pain Descriptors / Indicators: Aching Pain Intervention(s): Limited activity within patient's tolerance;Monitored during session;Repositioned    Home Living                      Prior Function            PT Goals (current goals can now be found in the care plan section) Progress towards PT goals: Progressing toward goals    Frequency  Min 3X/week    PT Plan Current plan remains appropriate    Co-evaluation             End of Session   Activity Tolerance: Patient limited by lethargy Patient left: in bed;with call bell/phone within reach;with  family/visitor present     Time: [id number] PT Time Calculation (min) (ACUTE ONLY): 37 min  Charges:  $Therapeutic Exercise: 8-22 mins $Therapeutic Activity: 8-22 mins                    G Codes:      Jodi Geralds, [place] 05/27/2014, 2:37 PM

## 2014-05-27 NOTE — Progress Notes (Signed)
[facility] PROGRESS NOTE Subjective patient and her daughter note's that she had to bowel movement after starting the laxatives enemas. Her nausea is improved and she ate approximately half of her food today without much nausea  Objective: Vital signs in last 24 hours: Temp:  [98 F (36.7 C)-98.9 F (37.2 C)] 98.2 F (36.8 C) (02/17 1551) Pulse Rate:  [103-120] 118 (02/17 1551) Resp:  [16-18] 16 (02/17 1551) BP: (131-148)/(55-77) 136/74 mmHg (02/17 1551) SpO2:  [95 %-98 %] 95 % (02/17 1551) Last BM Date: 05/26/14  Intake/Output from previous day:   Intake/Output this shift:    PE:  Abdomen-- soft and nontender  Lab Results:  Recent Labs  05/25/14 0430 05/26/14 0549  WBC 6.4 6.2  HGB 10.0* 9.5*  HCT 30.3* 28.6*  PLT 108* 108*   BMET  Recent Labs  05/25/14 0430 05/26/14 0549  NA 133* 134*  K 3.5 3.5  CL 98 97  CO2 30 30  CREATININE 0.99 1.10   LFT  Recent Labs  05/25/14 0430  PROT 5.4*  AST 56*  ALT 49*  ALKPHOS 292*  BILITOT 0.5   PT/INR No results for input(s): LABPROT, INR in the last 72 hours. PANCREAS No results for input(s): LIPASE in the last 72 hours.       Studies/Results: No results found.  Medications: I have reviewed the patient's current medications.  Assessment/Plan: 1. Nausea and vomiting 2. Constipation appears to be better after bowel movements with Miralax and enemas. Will continue the Miralax chronically and see how she does   Jeremy Mclamb JR,Akin Yi L 05/27/2014, 4:30 PM

## 2014-05-27 NOTE — Progress Notes (Signed)
Speech Language Pathology Treatment: Dysphagia  Patient Details Name: Morgan [name] MRN: [id number] DOB: 19-Mar-1941 Today's Date: 05/27/2014 Time: [id number] SLP Time Calculation (min) (ACUTE ONLY): 22 min  Assessment / Plan / Recommendation Clinical Impression  Pt continues to have an occasional cough at baseline, observed x2 throughout conversation prior to PO intake. Pureed solids were consumed without overt signs of difficulty, however ice chips elicited strong, reflexive coughing immediate post swallow as well as while pt was continuing to masticate ice, concerning for premature spillage. SLP offered Dys 2 textures, however pt requested to be placed on the bedpan. No further POs were provided as pt was then in a reclined position, however SLP reiterated education to patient and daughter regarding current recommendations and risk for aspiration/infection as pt continues to show overt signs of difficulty with thin liquids. Continue to recommend CIR for f/u therapy.   HPI HPI: 74 year old female admitted 05/03/14, who underwent Craniotomy post Meningioma Resection. PMH significant for GERD, COPD, DM, Seizures, and VDRF. MBS ordered to assess readiness to advance diet.   Pertinent Vitals Pain Assessment: Faces Faces Pain Scale: No hurt  SLP Plan  Continue with current plan of care    Recommendations Diet recommendations: Dysphagia 2 (fine chop);Nectar-thick liquid Liquids provided via: Cup;Straw Medication Administration: Crushed with puree Supervision: Patient able to self feed;Full supervision/cueing for compensatory strategies Compensations: Slow rate;Small sips/bites;Follow solids with liquid;Multiple dry swallows after each bite/sip Postural Changes and/or Swallow Maneuvers: Upright 30-60 min after meal;Seated upright 90 degrees       Oral Care Recommendations: Oral care BID Follow up Recommendations: Inpatient Rehab;24 hour supervision/assistance Plan: Continue with current plan of  care    GO      Germain Osgood, M.A. CCC-SLP [phone]  Germain Osgood 05/27/2014, 11:37 AM

## 2014-05-27 NOTE — Progress Notes (Signed)
Patient refused morning dulcolax suppository and soap suds enema. Will continue to monitor.

## 2014-05-27 NOTE — Progress Notes (Signed)
NUTRITION FOLLOW UP  Intervention:   TPN per pharmacy Encourage PO intake as tolerated RD to continue to monitor  Nutrition Dx:   Inadequate oral intake related to nausea and vomiting as evidenced by pt's report and 17 lb weight loss; ongoing  New Goal: Pt to meet >/= 90% of their estimated nutrition needs; unmet/progressing  Monitor:   PO intake, supplement acceptance, weight trend, labs  Assessment:   Pt admitted 1/24 with AMS, found to have seizures in the setting of meningioma. S/p resection 1/29 and remained intubated.   Pt's diet was advanced Friday afternoon, now on Dysphagia 2 with nectar-thick liquids. Pt had medium-large BM after soap suds enema yesterday per nursing notes.  Pt much more pleasant and interactive today. She reports ongoing nausea but, improved. She states she ate a small amount of eggs and oatmeal this morning and was able to keep it down.  Pt is currently receiving Clinimix E 5/15 at goal of 83 ml/hr + IVFE at 10 ml/hr which provides 1894 kcal and 99 grams of protein (100% of estimated needs).  Pt's weight has dropped 17 lbs since admission.   Labs: elevated glucose improving, low hemoglobin, low calcium, elevated triglycerides; magnesium and phosphorus WNL  Height: Ht Readings from Last 1 Encounters:  05/08/14 5\' 4"  (1.626 m)    Weight Status:   Wt Readings from Last 1 Encounters:  05/19/14 221 lb 1.9 oz (100.3 kg)  05/12/14 238 lbs  Re-estimated needs:  Kcal: 1700-1950 Protein: 95-110 grams Fluid: 2.8 L/day  Skin: closed incision on head; +1 facial edema and RUE edema, +1 generalized edema  Diet Order: TPN (CLINIMIX-E) Adult DIET DYS 2 TPN (CLINIMIX-E) Adult  No intake or output data in the 24 hours ending 05/27/14 1441  Last BM: 2/16   Labs:   Recent Labs Lab 05/22/14 0555 05/23/14 0400 05/25/14 0430 05/26/14 0549  NA 136 134* 133* 134*  K 3.5 3.5 3.5 3.5  CL 100 96 98 97  CO2 30 33* 30 30  BUN 15 14 20  25*  CREATININE  0.78 0.81 0.99 1.10  CALCIUM 8.0* 7.8* 8.0* 7.9*  MG 1.9 1.6 1.6  --   PHOS 3.3 3.1 3.5  --   GLUCOSE 131* 191* 181* 122*    CBG (last 3)   Recent Labs  05/27/14 0340 05/27/14 0748 05/27/14 1204  GLUCAP 173* 172* 264*    Scheduled Meds: . anastrozole  1 mg Oral Daily  . antiseptic oral rinse  7 mL Mouth Rinse BID  . cloNIDine  0.3 mg Transdermal Weekly  . colchicine  0.6 mg Oral BID  . DULoxetine  60 mg Oral BID  . furosemide  40 mg Oral BID  . guaiFENesin  600 mg Oral BID  . heparin subcutaneous  5,000 Units Subcutaneous 3 times per day  . insulin aspart  0-20 Units Subcutaneous 6 times per day  . lacosamide (VIMPAT) IV  200 mg Intravenous Q12H  . levETIRAcetam  500 mg Intravenous 3 times per day  . levothyroxine  200 mcg Oral Once per day on Mon Tue Wed Thu Fri Sat  . levothyroxine  300 mcg Oral Once every 7 days  . Liraglutide  1.8 mg Subcutaneous Daily  . metoCLOPramide (REGLAN) injection  10 mg Intravenous 4 times per day  . mometasone-formoterol  2 puff Inhalation BID  . pantoprazole (PROTONIX) IV  40 mg Intravenous Q12H  . polyethylene glycol  17 g Oral BID  . scopolamine  1 patch Transdermal Q72H  Continuous Infusions: . sodium chloride 10 mL/hr at 05/26/14 1056  . Marland KitchenTPN (CLINIMIX-E) Adult 83 mL/hr at 05/26/14 1745   And  . fat emulsion 240 mL (05/26/14 1744)  . Marland KitchenTPN (CLINIMIX-E) Adult     And  . fat emulsion      Pryor Ochoa RD, LDN Inpatient Clinical Dietitian Pager: [phone] After Hours Pager: [phone]

## 2014-05-27 NOTE — Progress Notes (Addendum)
Patient ID: Morgan [name], female   DOB: [birth year], 74 y.o.   MRN: [id number]  BP 136/74 mmHg  Pulse 118  Temp(Src) 98.2 F (36.8 C) (Axillary)  Resp 16  Ht 5' 4"  (1.626 m)  Wt 100.3 kg (221 lb 1.9 oz)  BMI 37.94 kg/m2  SpO2 95%  Alert and oriented x 4 Moving all extremities Complained of nausea when sitting up at side of bed Not quite as bright as yesterday, was given dilaudid for pain earlier. Will discontinue dilaudid today.

## 2014-05-27 NOTE — Progress Notes (Signed)
Pt has run out of medicine in [place]. Family was made aware to bring more for tomorrow night to bring to pharmacy since we do not carry this medication downstairs. Tonight's dose was held and was charted in [facility].

## 2014-05-27 NOTE — Progress Notes (Addendum)
PARENTERAL NUTRITION CONSULT NOTE - FOLLOW UP  Pharmacy Consult for TPN Indication: Bowel rest  Allergies  Allergen Reactions  . Latex Rash  . Sulfa Antibiotics Other (See Comments)    Unknown allergic reaction  . Penicillins Rash    Patient Measurements: Height: 5\' 4"  (162.6 cm) Weight: 221 lb 1.9 oz (100.3 kg) IBW/kg (Calculated) : 54.7 Adjusted Body Weight:  Usual Weight:   Vital Signs: Temp: 98.9 F (37.2 C) (02/17 0440) Temp Source: Oral (02/17 0440) BP: 137/77 mmHg (02/17 0440) Pulse Rate: 110 (02/17 0730) Intake/Output from previous day:   Intake/Output from this shift:    Labs:  Recent Labs  05/25/14 0430 05/26/14 0549  WBC 6.4 6.2  HGB 10.0* 9.5*  HCT 30.3* 28.6*  PLT 108* 108*     Recent Labs  05/25/14 0430 05/26/14 0549  NA 133* 134*  K 3.5 3.5  CL 98 97  CO2 30 30  GLUCOSE 181* 122*  BUN 20 25*  CREATININE 0.99 1.10  CALCIUM 8.0* 7.9*  MG 1.6  --   PHOS 3.5  --   PROT 5.4*  --   ALBUMIN 1.9*  --   AST 56*  --   ALT 49*  --   ALKPHOS 292*  --   BILITOT 0.5  --   PREALBUMIN 10.7*  --   TRIG 288*  --    Estimated Creatinine Clearance: 52.4 mL/min (by C-G formula based on Cr of 1.1).    Recent Labs  05/26/14 2358 05/27/14 0340 05/27/14 0748  GLUCAP 200* 173* 172*    Medications:  Scheduled:  . anastrozole  1 mg Oral Daily  . antiseptic oral rinse  7 mL Mouth Rinse BID  . cloNIDine  0.3 mg Transdermal Weekly  . colchicine  0.6 mg Oral BID  . DULoxetine  60 mg Oral BID  . furosemide  40 mg Oral BID  . guaiFENesin  600 mg Oral BID  . heparin subcutaneous  5,000 Units Subcutaneous 3 times per day  . insulin aspart  0-20 Units Subcutaneous 6 times per day  . lacosamide (VIMPAT) IV  200 mg Intravenous Q12H  . levETIRAcetam  500 mg Intravenous 3 times per day  . levothyroxine  200 mcg Oral Once per day on Mon Tue Wed Thu Fri Sat  . levothyroxine  300 mcg Oral Once every 7 days  . Liraglutide  1.8 mg Subcutaneous Daily  .  metoCLOPramide (REGLAN) injection  10 mg Intravenous 4 times per day  . mometasone-formoterol  2 puff Inhalation BID  . pantoprazole (PROTONIX) IV  40 mg Intravenous Q12H  . polyethylene glycol  17 g Oral BID  . scopolamine  1 patch Transdermal Q72H    Insulin Requirements in the past 24 hours:  27 units resistant SSI + Victoza 1.8 mg daily + 65 units regular insulin in TPN  Nutritional Goals: Kcal: 1700-1950 Protein: 95-110 grams  Current nutrition: Dys 2 diet Clinimix E 5/15 at 83 ml/hr & IVFE 10 ml/hr to provide 99g protein & 1894kcal daily, meeting 100% needs.  Assessment: 59 YOF presented on 05/03/14 with AMS and left-sided weakness. 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 Patient did not tolerate PO intake post-op due to persistent nausea and vomiting. Unable to use GI tract per Gertie Fey and Pharmacy consulted to manage TPN for nutritional support. She is at risk for refeeding syndrome.  GI: obesity / GERD. 17 lb weight loss in past week; baseline prealbumin low at 12.1. (+)  N, (-)V. EGD performed 2/12 - essentially normal. Advanced to Dysphasia 2 diet 2/16- no intake recorded, taking ice pops but not really eating; hoping relief of constipation will help with appetite (LBM 2/16 after SSE). Overall not receiving many opioid doses, on Scopolamine patch as well which may contribute. SSE x 1 (2/16), Miralax BID (2/8 >> ), Reglan IV q6 (2/15 >> ), MVI, PPI PO   Endo:  Hypothyroidism, DM2 (U-500 PTA). CBGs uncontrolled prior to TPN initiation. CBGs 172-207- increased with resultant inc SSI.  Note:  Did not receive Victoza 2/16- awaiting refill to be brought in (hopefully today) as we do not carry this.  Will increase insulin, based on 2/16 SSI use as cbgs have mostly been above goal.  Synthroid, Victoza, SSI, Insulin in TPN  Lytes: K 3.5 on 2/16.  No labs today  Renal: SCr stable on 2/16, CrCL 70 ml/min. UOP x 4  Pulm: COPD - RA , has  thick secretions - Dulera, guaifenesin   Cards: HTN / HLD - BP & HR elevated - clonidine TTS, Lasix  Hepatobil: lipase / amylase / TG WNL at baseline. AlkPhos 292- inc, o/w LFTs stable. TG 288- large jump  Heme/Onc: Hg 10, plts 108- dec from 152 on 2/10 - anastrozole, Hep SQ (2/12 >> )  Neuro: depression / neuropathy. Lethargic, arousable. New right frontal meningioma causing seizure, s/p resection on 1/29 - Duloxetine, Vimpat, Keppra, scopolamine patch, receiving PRN Dilaudid   ID: afebrile, WBC wnl - not on abx  Best Practices: SCDs- currently refusing, Heparin SQ (2/12 >> ) TPN Access:  right IJ placed 05/17/14 TPN day#: 2/9 >>   Current Nutrition:  TPN  Nutritional Goals:  1700-1900 kCal, 95-110grams of protein per day   Plan:  - Advance Clinimix E 5/15 to goal 83 ml/hr & IVFE 10 ml/hr to provide 99g protein & 1894kcal daily, meeting 100% needs. - Daily multivitamin and trace elements - Continue resistant SSI Q4H + Victoza + increase insulin in TPN to 75 units ( ~38 units/L) - F/U diet tolerance & Victoza refill  Gracy Bruins, PharmD Clinical Pharmacist [facility]

## 2014-05-27 NOTE — Progress Notes (Signed)
IJ line removed by patient. Pressure held to site, vaseline dressing applied, IV team called. Patient in no distress. MD paged. New orders placed. Will continue to monitor closely.

## 2014-05-28 LAB — COMPREHENSIVE METABOLIC PANEL
ALBUMIN: 2 g/dL — AB (ref 3.5–5.2)
ALK PHOS: 373 U/L — AB (ref 39–117)
ALT: 50 U/L — AB (ref 0–35)
AST: 52 U/L — AB (ref 0–37)
Anion gap: 6 (ref 5–15)
BUN: 19 mg/dL (ref 6–23)
CALCIUM: 8.4 mg/dL (ref 8.4–10.5)
CO2: 32 mmol/L (ref 19–32)
Chloride: 96 mmol/L (ref 96–112)
Creatinine, Ser: 0.98 mg/dL (ref 0.50–1.10)
GFR calc non Af Amer: 56 mL/min — ABNORMAL LOW (ref >90)
GFR, EST AFRICAN AMERICAN: 65 mL/min — AB (ref >90)
Glucose, Bld: 152 mg/dL — ABNORMAL HIGH (ref 70–99)
Potassium: 4.1 mmol/L (ref 3.5–5.1)
SODIUM: 134 mmol/L — AB (ref 135–145)
Total Bilirubin: 0.7 mg/dL (ref 0.3–1.2)
Total Protein: 5.9 g/dL — ABNORMAL LOW (ref 6.0–8.3)

## 2014-05-28 LAB — GLUCOSE, CAPILLARY
GLUCOSE-CAPILLARY: 140 mg/dL — AB (ref 70–99)
Glucose-Capillary: 154 mg/dL — ABNORMAL HIGH (ref 70–99)
Glucose-Capillary: 155 mg/dL — ABNORMAL HIGH (ref 70–99)
Glucose-Capillary: 180 mg/dL — ABNORMAL HIGH (ref 70–99)
Glucose-Capillary: 184 mg/dL — ABNORMAL HIGH (ref 70–99)
Glucose-Capillary: 212 mg/dL — ABNORMAL HIGH (ref 70–99)

## 2014-05-28 LAB — MAGNESIUM: Magnesium: 1.5 mg/dL (ref 1.5–2.5)

## 2014-05-28 LAB — PHOSPHORUS: Phosphorus: 4.1 mg/dL (ref 2.3–4.6)

## 2014-05-28 MED ORDER — MAGNESIUM SULFATE IN D5W 10-5 MG/ML-% IV SOLN
1.0000 g | Freq: Once | INTRAVENOUS | Status: AC
  Administered 2014-05-28: 1 g via INTRAVENOUS
  Filled 2014-05-28: qty 100

## 2014-05-28 MED ORDER — TRACE MINERALS CR-CU-F-FE-I-MN-MO-SE-ZN IV SOLN
INTRAVENOUS | Status: DC
  Filled 2014-05-28: qty 1992

## 2014-05-28 MED ORDER — FAT EMULSION 20 % IV EMUL
240.0000 mL | INTRAVENOUS | Status: DC
  Filled 2014-05-28: qty 250

## 2014-05-28 NOTE — Progress Notes (Signed)
[facility] PROGRESS NOTE Subjective patient without nausea during the night. Ate some supper. Feeling much better since she is having spontaneous bowel movements.  Objective: Vital signs in last 24 hours: Temp:  [97.5 F (36.4 C)-99.1 F (37.3 C)] 97.6 F (36.4 C) (02/18 0610) Pulse Rate:  [103-118] 112 (02/18 0610) Resp:  [16-20] 18 (02/18 0610) BP: (117-148)/(57-75) 117/67 mmHg (02/18 0610) SpO2:  [95 %-100 %] 97 % (02/18 0610) Last BM Date: 05/26/14  Intake/Output from previous day:   Intake/Output this shift:    PE: General--somewhat sleepy no acute distress  Abdomen-- soft nontender nondistended  Lab Results:  Recent Labs  05/26/14 0549  WBC 6.2  HGB 9.5*  HCT 28.6*  PLT 108*   BMET  Recent Labs  05/26/14 0549  NA 134*  K 3.5  CL 97  CO2 30  CREATININE 1.10   LFT No results for input(s): PROT, AST, ALT, ALKPHOS, BILITOT, BILIDIR, IBILI in the last 72 hours. PT/INR No results for input(s): LABPROT, INR in the last 72 hours. PANCREAS No results for input(s): LIPASE in the last 72 hours.       Studies/Results: No results found.  Medications: I have reviewed the patient's current medications.  Assessment/Plan: 1. Nausea. Much better with spontaneous bowel movements. At this point I do not feel she needs any further testing. Would continue her on Miralax in order to achieve acceptable bowel movements. We will sign off please call back if needed   Devaney Segers JR,Antuane Eastridge L 05/28/2014, 6:33 AM

## 2014-05-28 NOTE — Progress Notes (Signed)
Pt family brought in more Victoza pens for daily administration since this is not a med we have in stock. Pharmacy form was filled out and 2 pens were brought downstairs to be in kept in main pharmacy. White copy is in shadow chart.

## 2014-05-28 NOTE — Progress Notes (Signed)
Occupational Therapy Treatment Patient Details Name: Morgan [name] MRN: [id number] DOB: 05/12/1940 Today's Date: 05/28/2014    History of present illness pt presents with Craniotomy post Meningioma Resection, Seizures, and VDRF.     OT comments  Patient progressing towards goals. Care Plan/Paths updated and some goals completed, some upgraded. Patient's family continues to be supportive and is present in her room during most, if not all, therapy sessions. Continue to recommend CIR for comprehensive OT/PT/SLP to help patient gain back her independence and quality of life.    Follow Up Recommendations  CIR;Supervision/Assistance - 24 hour    Equipment Recommendations  Other (comment) (TBD)    Recommendations for Other Services Rehab consult    Precautions / Restrictions Precautions Precautions: Fall Restrictions Weight Bearing Restrictions: No       Mobility Bed Mobility Overal bed mobility: Needs Assistance Bed Mobility: Rolling;Sidelying to Sit Rolling: Modified independent (Device/Increase time) Sidelying to sit: Supervision          Transfers Overall transfer level: Needs assistance Equipment used: Rolling walker (2 wheeled) Transfers: Sit to/from [facility] Sit to Stand: Mod assist Stand pivot transfers: Mod assist       General transfer comment: Cues required for safety, technique, and hand placement    Balance Overall balance assessment: Needs assistance Sitting-balance support: Bilateral upper extremity supported;Feet supported Sitting balance-Leahy Scale: Fair     Standing balance support: Bilateral upper extremity supported;During functional activity Standing balance-Leahy Scale: Poor                     ADL Overall ADL's : Needs assistance/impaired Eating/Feeding: Supervision/ safety;Sitting   Grooming: Supervision/safety;Sitting   Upper Body Bathing: Sitting;Minimal assitance   Lower Body Bathing: Moderate  assistance;Sit to/from stand   Upper Body Dressing : Minimal assistance;Sitting   Lower Body Dressing: Moderate assistance;Sit to/from stand;Cueing for safety   Toilet Transfer: Moderate assistance;RW;BSC;Stand-pivot             General ADL Comments: Patient motivated to work with therapist. Patient with bad news yesterday, hearing 6 friends have passed in the past couple weeks; patient emotional during session secondary to this. patient sat EOB using bed rails with supervision. While seated EOB, patient worked on crossing [place] in order to increase independence with LB ADLs.  Patient stood using RW with mod assist (1 person). Patient then performed stand pivot transfer into recliner with mod assist and mod verbal cues for safety, technique, and hand placement. Patient's eyes were open more, but required verbal cues to keep them opened. Patient's son present and provided support as needed and appropriately.      Cognition   Behavior During Therapy: Flat affect Overall Cognitive Status: Difficult to assess Area of Impairment: Attention   Current Attention Level: Focused Memory: Decreased short-term memory  Following Commands: Follows multi-step commands with increased time       General Comments: Pt continues to perseverate on tasks.                  Pertinent Vitals/ Pain       Pain Assessment: No/denies pain         Frequency Min 3X/week     Progress Toward Goals  OT Goals(current goals can now be found in the care plan section)  Progress towards OT goals: Goals met and updated - see care plan (some goals met, some upgraded; patient progressing nicely)  Acute Rehab OT Goals Patient Stated Goal: get stronger OT Goal Formulation: With  patient/family Time For Goal Achievement: 06/11/14 Potential to Achieve Goals: Good ADL Goals Pt Will Perform Lower Body Bathing: with supervision;with adaptive equipment;sit to/from stand (upgraded 2/18) Pt Will Perform Lower Body  Dressing: with supervision;with adaptive equipment;sit to/from stand (upgraded 2/18)  Plan Discharge plan remains appropriate       End of Session Equipment Utilized During Treatment: Rolling walker;Gait belt   Activity Tolerance Patient tolerated treatment well   Patient Left in chair;with call bell/phone within reach;with family/visitor present     Time: 1421-1445 OT Time Calculation (min): 24 min  Charges: OT General Charges $OT Visit: 1 Procedure OT Treatments $Self Care/Home Management : 8-22 mins $Therapeutic Activity: 8-22 mins  Nancey Kreitz , MS, OTR/L, CLT Pager: [phone]  05/28/2014, 3:03 PM

## 2014-05-28 NOTE — Progress Notes (Signed)
Paged RT, patient sounds congested.

## 2014-05-28 NOTE — Progress Notes (Signed)
NUTRITION FOLLOW UP  Intervention:   Encourage PO intake as tolerated  Recommend placing NJ tube to initiate enteral nutrition to use gut: Initiate Vital AF 1.2 via NJ-tube @ 20 ml/hr and increasing by 10 ml/hr every 8 hours to goal rate of 60 ml/hr. This will provide 1728 kcal, 108 grams of protein, and 1168 ml of water.   If Tube feeding not able to be initiated, recommend continuing TPN   Nutrition Dx:   Inadequate oral intake related to nausea and vomiting as evidenced by pt's report and 17 lb weight loss; ongoing  New Goal: Pt to meet >/= 90% of their estimated nutrition needs; unmet  Monitor:   Nutrition support (TPN vs. TF) PO intake, weight trend, labs  Assessment:   Pt admitted 1/24 with AMS, found to have seizures in the setting of meningioma. S/p resection 1/29 and remained intubated.   Per RN patient continues to have nausea today and was unable to keep down pills in applesauce. Pt has been tolerating [place] ice. TPN not running at time of visit; RN reports pt pulled out central line.  Pt's weight has dropped 17 lbs since admission.   Labs: elevated glucose improving, low sodium, low hemoglobin, elevated triglycerides, high alkaline phosphatase, elevates AST and ALT; magnesium and phosphorus WNL  Height: Ht Readings from Last 1 Encounters:  05/08/14 5\' 4"  (1.626 m)    Weight Status:   Wt Readings from Last 1 Encounters:  05/19/14 221 lb 1.9 oz (100.3 kg)  05/12/14 238 lbs  Re-estimated needs:  Kcal: 1700-1950 Protein: 95-110 grams Fluid: 2.8 L/day  Skin: closed incision on head; +1 facial edema and RUE edema, +1 generalized edema  Diet Order: DIET DYS 2 TPN (CLINIMIX-E) Adult TPN (CLINIMIX-E) Adult  Nectar-thick liquids  No intake or output data in the 24 hours ending 05/28/14 1107  Last BM: 2/16   Labs:   Recent Labs Lab 05/23/14 0400 05/25/14 0430 05/26/14 0549 05/28/14 0829  NA 134* 133* 134* 134*  K 3.5 3.5 3.5 4.1  CL 96 98 97 96   CO2 33* 30 30 32  BUN 14 20 25* 19  CREATININE 0.81 0.99 1.10 0.98  CALCIUM 7.8* 8.0* 7.9* 8.4  MG 1.6 1.6  --  1.5  PHOS 3.1 3.5  --  4.1  GLUCOSE 191* 181* 122* 152*    CBG (last 3)   Recent Labs  05/28/14 0019 05/28/14 0355 05/28/14 0746  GLUCAP 180* 140* 154*    Scheduled Meds: . anastrozole  1 mg Oral Daily  . antiseptic oral rinse  7 mL Mouth Rinse BID  . cloNIDine  0.3 mg Transdermal Weekly  . colchicine  0.6 mg Oral BID  . DULoxetine  60 mg Oral BID  . furosemide  40 mg Oral BID  . guaiFENesin  600 mg Oral BID  . heparin subcutaneous  5,000 Units Subcutaneous 3 times per day  . insulin aspart  0-20 Units Subcutaneous 6 times per day  . lacosamide (VIMPAT) IV  200 mg Intravenous Q12H  . levETIRAcetam  500 mg Intravenous 3 times per day  . levothyroxine  200 mcg Oral Once per day on Mon Tue Wed Thu Fri Sat  . levothyroxine  300 mcg Oral Once every 7 days  . Liraglutide  1.8 mg Subcutaneous Daily  . magnesium sulfate 1 - 4 g bolus IVPB  1 g Intravenous Once  . metoCLOPramide (REGLAN) injection  10 mg Intravenous 4 times per day  . mometasone-formoterol  2  puff Inhalation BID  . pantoprazole (PROTONIX) IV  40 mg Intravenous Q12H  . polyethylene glycol  17 g Oral BID  . scopolamine  1 patch Transdermal Q72H    Continuous Infusions: . sodium chloride 10 mL/hr at 05/28/14 1047  . Marland KitchenTPN (CLINIMIX-E) Adult     And  . fat emulsion    . Marland KitchenTPN (CLINIMIX-E) Adult     And  . fat emulsion    . lactated ringers 75 mL/hr at 05/27/14 [place], LDN Inpatient Clinical Dietitian Pager: [phone] After Hours Pager: [phone]

## 2014-05-28 NOTE — Progress Notes (Signed)
Patient ID: Morgan [name], female   DOB: Feb 28, 1941, 74 y.o.   MRN: [id number] BP 121/61 mmHg  Pulse 114  Temp(Src) 98.1 F (36.7 C) (Oral)  Resp 20  Ht 5' 4"  (1.626 m)  Wt 100.3 kg (221 lb 1.9 oz)  BMI 37.94 kg/m2  SpO2 96% Alert and oriented x 4, speech is clear, fluent Following all commands Moving all extremities Able to get out of bed on her own today Obviously improved. Need to accelerate her diet.

## 2014-05-28 NOTE — Progress Notes (Signed)
Called MD to see if patient's still need the TPN. Awaiting call back.

## 2014-05-28 NOTE — Progress Notes (Signed)
Patient has refused CPT at this time. Patient does not want to try the flutter valve or the chest vest. Patient states she is feeling nauseous. However patient also says she has been using the flutter on her own through out the day when she feels like it and will continue to do so. RT will continue to assist as needed.

## 2014-05-28 NOTE — Progress Notes (Signed)
PARENTERAL NUTRITION CONSULT NOTE - FOLLOW UP  Pharmacy Consult for TPN Indication: Bowel rest  Allergies  Allergen Reactions  . Latex Rash  . Sulfa Antibiotics Other (See Comments)    Unknown allergic reaction  . Penicillins Rash    Patient Measurements: Height: 5\' 4"  (162.6 cm) Weight: 221 lb 1.9 oz (100.3 kg) IBW/kg (Calculated) : 54.7   Vital Signs: Temp: 97.6 F (36.4 C) (02/18 0610) Temp Source: Axillary (02/18 0610) BP: 117/67 mmHg (02/18 0610) Pulse Rate: 112 (02/18 0610) Intake/Output from previous day:   Intake/Output from this shift:    Labs:  Recent Labs  05/26/14 0549  WBC 6.2  HGB 9.5*  HCT 28.6*  PLT 108*     Recent Labs  05/26/14 0549  NA 134*  K 3.5  CL 97  CO2 30  GLUCOSE 122*  BUN 25*  CREATININE 1.10  CALCIUM 7.9*   Estimated Creatinine Clearance: 52.4 mL/min (by C-G formula based on Cr of 1.1).    Recent Labs  05/28/14 0019 05/28/14 0355 05/28/14 0746  GLUCAP 180* 140* 154*    Medications:  Scheduled:  . anastrozole  1 mg Oral Daily  . antiseptic oral rinse  7 mL Mouth Rinse BID  . cloNIDine  0.3 mg Transdermal Weekly  . colchicine  0.6 mg Oral BID  . DULoxetine  60 mg Oral BID  . furosemide  40 mg Oral BID  . guaiFENesin  600 mg Oral BID  . heparin subcutaneous  5,000 Units Subcutaneous 3 times per day  . insulin aspart  0-20 Units Subcutaneous 6 times per day  . lacosamide (VIMPAT) IV  200 mg Intravenous Q12H  . levETIRAcetam  500 mg Intravenous 3 times per day  . levothyroxine  200 mcg Oral Once per day on Mon Tue Wed Thu Fri Sat  . levothyroxine  300 mcg Oral Once every 7 days  . Liraglutide  1.8 mg Subcutaneous Daily  . metoCLOPramide (REGLAN) injection  10 mg Intravenous 4 times per day  . mometasone-formoterol  2 puff Inhalation BID  . pantoprazole (PROTONIX) IV  40 mg Intravenous Q12H  . polyethylene glycol  17 g Oral BID  . scopolamine  1 patch Transdermal Q72H    Insulin Requirements in the past 24  hours:  31 units resistant SSI + Victoza 1.8 mg daily + 75 units regular insulin in TPN  Assessment: 73 YOF presented on 05/03/14 with AMS and left-sided weakness. 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 Patient did not tolerate PO intake post-op due to persistent nausea and vomiting. Unable to use GI tract per Gertie Fey and Pharmacy consulted to manage TPN for nutritional support. She is at risk for refeeding syndrome.  GI: obesity / GERD. 17 lb weight loss in past week; baseline prealbumin low at 12.1. (+)N, (-)V. EGD performed 2/12 - essentially normal. Advanced to Dysphasia 2 diet 2/16-ate some supper last night; having spontaneous bowel movement. Overall not receiving many opioid doses, on Scopolamine patch as well which may contribute. SSE x 1 (2/16), Miralax BID (2/8 >> ), Reglan IV q6 (2/15 >> ), MVI, PPI PO   Endo:  Hypothyroidism, DM2 (U-500 PTA). CBGs uncontrolled prior to TPN initiation. CBGs improved since yesterday.  Note:  Did not receive Victoza 2/16- awaiting refill to be brought in (hopefully today) as we do not carry this.  Synthroid, Victoza, SSI, Insulin in TPN  Lytes: K 4.1, Na 134, Mg 1.5, Phos 4.1   Renal: SCr stable  at 0.98, CrCL 55-60 ml/min.   Pulm: COPD - RA , has thick secretions - Dulera, guaifenesin   Cards: HTN / HLD - BP normal & HR elevated - clonidine TTS, Lasix  Hepatobil: lipase / amylase / TG WNL at baseline. AlkPhos 373- inc, o/w LFTs stable. TG 288- large jump  Heme/Onc: Hg 10, plts 108- dec from 152 on 2/10 - anastrozole, Hep SQ (2/12 >> )  Neuro: depression / neuropathy. Lethargic, arousable. New right frontal meningioma causing seizure, s/p resection on 1/29 - Duloxetine, Vimpat, Keppra, scopolamine patch, receiving PRN Dilaudid   ID: afebrile, WBC wnl - not on abx  Best Practices: SCDs- currently refusing, Heparin SQ (2/12 >> ) TPN Access:  right IJ placed 05/17/14 TPN day#: 2/9 >>    Nutritional Goals: Kcal: 1700-1950 Protein: 95-110 grams  Current nutrition: Dys 2 diet Clinimix E 5/15 at 83 ml/hr & IVFE 10 ml/hr to provide 100g protein & 1894kcal daily, meeting 100% needs.   Plan:  - Continue Clinimix E 5/15 at goal rate of 83 ml/hr & IVFE at 10 ml/hr to provide 100g protein & 1894kcal daily, meeting 100% needs. F/u TG next Monday and adjust IVFE accordingly  - Daily multivitamin and trace elements - Continue resistant SSI Q4H + Victoza +continue insulin in TPN at 75 units ( ~38 units/L) - F/U diet tolerance. Likely could start weaning soon.  - Mg sulfate 1 gm IV x 1   Albertina Parr, PharmD., BCPS Clinical Pharmacist Pager [phone]

## 2014-05-28 NOTE — Progress Notes (Signed)
Discussed with RN CM. Pt not currently at a level to do more intense therapies for inpt rehab admission . Noted plans to d/c Dilaudid. May need LTACH with TNA. I will continue to follow. [id number]

## 2014-05-29 ENCOUNTER — Inpatient Hospital Stay (HOSPITAL_COMMUNITY)
Admission: RE | Admit: 2014-05-29 | Discharge: 2014-06-11 | DRG: 055 | Disposition: A | Discharge status: Skilled Nursing Facility | Payer: Medicare Other | Source: Intra-hospital | Attending: Physical Medicine & Rehabilitation | Admitting: Physical Medicine & Rehabilitation

## 2014-05-29 DIAGNOSIS — G8194 Hemiplegia, unspecified affecting left nondominant side: Secondary | ICD-10-CM | POA: Diagnosis present

## 2014-05-29 DIAGNOSIS — E1142 Type 2 diabetes mellitus with diabetic polyneuropathy: Secondary | ICD-10-CM | POA: Diagnosis present

## 2014-05-29 DIAGNOSIS — E1140 Type 2 diabetes mellitus with diabetic neuropathy, unspecified: Secondary | ICD-10-CM | POA: Insufficient documentation

## 2014-05-29 DIAGNOSIS — E039 Hypothyroidism, unspecified: Secondary | ICD-10-CM | POA: Diagnosis present

## 2014-05-29 DIAGNOSIS — G8190 Hemiplegia, unspecified affecting unspecified side: Secondary | ICD-10-CM

## 2014-05-29 DIAGNOSIS — D320 Benign neoplasm of cerebral meninges: Secondary | ICD-10-CM | POA: Diagnosis present

## 2014-05-29 DIAGNOSIS — I10 Essential (primary) hypertension: Secondary | ICD-10-CM | POA: Diagnosis present

## 2014-05-29 DIAGNOSIS — B379 Candidiasis, unspecified: Secondary | ICD-10-CM | POA: Diagnosis present

## 2014-05-29 DIAGNOSIS — F329 Major depressive disorder, single episode, unspecified: Secondary | ICD-10-CM | POA: Diagnosis present

## 2014-05-29 DIAGNOSIS — Z853 Personal history of malignant neoplasm of breast: Secondary | ICD-10-CM | POA: Diagnosis not present

## 2014-05-29 DIAGNOSIS — R1310 Dysphagia, unspecified: Secondary | ICD-10-CM | POA: Diagnosis present

## 2014-05-29 DIAGNOSIS — M109 Gout, unspecified: Secondary | ICD-10-CM | POA: Diagnosis present

## 2014-05-29 DIAGNOSIS — G40909 Epilepsy, unspecified, not intractable, without status epilepticus: Secondary | ICD-10-CM | POA: Diagnosis present

## 2014-05-29 DIAGNOSIS — J449 Chronic obstructive pulmonary disease, unspecified: Secondary | ICD-10-CM | POA: Diagnosis present

## 2014-05-29 DIAGNOSIS — K5900 Constipation, unspecified: Secondary | ICD-10-CM | POA: Diagnosis present

## 2014-05-29 DIAGNOSIS — R000 Tachycardia, unspecified: Secondary | ICD-10-CM | POA: Diagnosis present

## 2014-05-29 DIAGNOSIS — E1342 Other specified diabetes mellitus with diabetic polyneuropathy: Secondary | ICD-10-CM

## 2014-05-29 DIAGNOSIS — D329 Benign neoplasm of meninges, unspecified: Secondary | ICD-10-CM | POA: Diagnosis present

## 2014-05-29 DIAGNOSIS — R4182 Altered mental status, unspecified: Secondary | ICD-10-CM

## 2014-05-29 LAB — BASIC METABOLIC PANEL
Anion gap: 8 (ref 5–15)
BUN: 14 mg/dL (ref 6–23)
CO2: 28 mmol/L (ref 19–32)
Calcium: 8.3 mg/dL — ABNORMAL LOW (ref 8.4–10.5)
Chloride: 97 mmol/L (ref 96–112)
Creatinine, Ser: 0.99 mg/dL (ref 0.50–1.10)
GFR calc non Af Amer: 55 mL/min — ABNORMAL LOW (ref >90)
GFR, EST AFRICAN AMERICAN: 64 mL/min — AB (ref >90)
Glucose, Bld: 167 mg/dL — ABNORMAL HIGH (ref 70–99)
Potassium: 3.9 mmol/L (ref 3.5–5.1)
SODIUM: 133 mmol/L — AB (ref 135–145)

## 2014-05-29 LAB — GLUCOSE, CAPILLARY
GLUCOSE-CAPILLARY: 140 mg/dL — AB (ref 70–99)
GLUCOSE-CAPILLARY: 165 mg/dL — AB (ref 70–99)
GLUCOSE-CAPILLARY: 252 mg/dL — AB (ref 70–99)
Glucose-Capillary: 132 mg/dL — ABNORMAL HIGH (ref 70–99)
Glucose-Capillary: 141 mg/dL — ABNORMAL HIGH (ref 70–99)
Glucose-Capillary: 166 mg/dL — ABNORMAL HIGH (ref 70–99)

## 2014-05-29 LAB — PHOSPHORUS: PHOSPHORUS: 3.9 mg/dL (ref 2.3–4.6)

## 2014-05-29 LAB — CBC
HEMATOCRIT: 31.1 % — AB (ref 36.0–46.0)
Hemoglobin: 10.2 g/dL — ABNORMAL LOW (ref 12.0–15.0)
MCH: 29.7 pg (ref 26.0–34.0)
MCHC: 32.8 g/dL (ref 30.0–36.0)
MCV: 90.7 fL (ref 78.0–100.0)
Platelets: 151 10*3/uL (ref 150–400)
RBC: 3.43 MIL/uL — AB (ref 3.87–5.11)
RDW: 14 % (ref 11.5–15.5)
WBC: 5 10*3/uL (ref 4.0–10.5)

## 2014-05-29 LAB — CREATININE, SERUM
Creatinine, Ser: 1.06 mg/dL (ref 0.50–1.10)
GFR calc non Af Amer: 51 mL/min — ABNORMAL LOW (ref >90)
GFR, EST AFRICAN AMERICAN: 59 mL/min — AB (ref >90)

## 2014-05-29 LAB — MAGNESIUM: Magnesium: 1.6 mg/dL (ref 1.5–2.5)

## 2014-05-29 MED ORDER — ARTIFICIAL TEARS OP OINT: TOPICAL_OINTMENT | OPHTHALMIC | Status: DC | PRN

## 2014-05-29 MED ORDER — PANTOPRAZOLE SODIUM 40 MG PO TBEC: 40.0000 mg | DELAYED_RELEASE_TABLET | Freq: Two times a day (BID) | ORAL | Status: DC

## 2014-05-29 MED ORDER — ACETAMINOPHEN 650 MG RE SUPP
650.0000 mg | Freq: Four times a day (QID) | RECTAL | Status: DC | PRN
  Filled 2014-05-29 (×2): qty 1

## 2014-05-29 MED ORDER — FUROSEMIDE 40 MG PO TABS
40.0000 mg | ORAL_TABLET | Freq: Two times a day (BID) | ORAL | Status: DC
  Administered 2014-05-29 – 2014-06-11 (×21): 40 mg via ORAL
  Filled 2014-05-29 (×28): qty 1

## 2014-05-29 MED ORDER — HEPARIN SODIUM (PORCINE) 5000 UNIT/ML IJ SOLN
5000.0000 [IU] | Freq: Three times a day (TID) | INTRAMUSCULAR | Status: DC
  Administered 2014-05-29 – 2014-06-11 (×38): 5000 [IU] via SUBCUTANEOUS
  Filled 2014-05-29 (×40): qty 1

## 2014-05-29 MED ORDER — HYDROCODONE-ACETAMINOPHEN 5-325 MG PO TABS
1.0000 | ORAL_TABLET | Freq: Four times a day (QID) | ORAL | Status: DC | PRN
  Administered 2014-06-06 – 2014-06-07 (×3): 1 via ORAL
  Filled 2014-05-29 (×3): qty 1

## 2014-05-29 MED ORDER — SORBITOL 70 % SOLN
30.0000 mL | Freq: Every day | Status: DC | PRN
  Administered 2014-05-31: 30 mL via ORAL
  Filled 2014-05-29: qty 30

## 2014-05-29 MED ORDER — LEVETIRACETAM 500 MG PO TABS
500.0000 mg | ORAL_TABLET | Freq: Three times a day (TID) | ORAL | Status: DC
  Administered 2014-05-29 – 2014-06-11 (×34): 500 mg via ORAL
  Filled 2014-05-29 (×41): qty 1

## 2014-05-29 MED ORDER — LEVOTHYROXINE SODIUM 200 MCG PO TABS
200.0000 ug | ORAL_TABLET | ORAL | Status: DC
  Administered 2014-05-30 – 2014-06-11 (×10): 200 ug via ORAL
  Filled 2014-05-29 (×12): qty 1

## 2014-05-29 MED ORDER — PANTOPRAZOLE SODIUM 40 MG PO TBEC
40.0000 mg | DELAYED_RELEASE_TABLET | Freq: Two times a day (BID) | ORAL | Status: DC
  Administered 2014-05-29 – 2014-06-11 (×24): 40 mg via ORAL
  Filled 2014-05-29 (×23): qty 1

## 2014-05-29 MED ORDER — ONDANSETRON HCL 4 MG PO TABS
4.0000 mg | ORAL_TABLET | Freq: Four times a day (QID) | ORAL | Status: DC | PRN
  Administered 2014-05-30 – 2014-06-05 (×3): 4 mg via ORAL
  Filled 2014-05-29 (×3): qty 1

## 2014-05-29 MED ORDER — ONDANSETRON HCL 4 MG/2ML IJ SOLN
4.0000 mg | Freq: Four times a day (QID) | INTRAMUSCULAR | Status: DC | PRN
  Administered 2014-05-29 – 2014-06-08 (×6): 4 mg via INTRAVENOUS
  Filled 2014-05-29 (×5): qty 2

## 2014-05-29 MED ORDER — INSULIN ASPART 100 UNIT/ML ~~LOC~~ SOLN
0.0000 [IU] | SUBCUTANEOUS | Status: DC
Start: 2014-05-29 — End: 2014-06-01
  Administered 2014-05-29: 4 [IU] via SUBCUTANEOUS
  Administered 2014-05-30: 3 [IU] via SUBCUTANEOUS
  Administered 2014-05-30: 4 [IU] via SUBCUTANEOUS
  Administered 2014-05-30 (×2): 3 [IU] via SUBCUTANEOUS
  Administered 2014-05-30: 4 [IU] via SUBCUTANEOUS
  Administered 2014-05-30 – 2014-05-31 (×3): 3 [IU] via SUBCUTANEOUS
  Administered 2014-05-31: 15 [IU] via SUBCUTANEOUS
  Administered 2014-05-31: 4 [IU] via SUBCUTANEOUS
  Administered 2014-05-31: 11 [IU] via SUBCUTANEOUS
  Administered 2014-06-01: 4 [IU] via SUBCUTANEOUS

## 2014-05-29 MED ORDER — LEVOTHYROXINE SODIUM 150 MCG PO TABS
300.0000 ug | ORAL_TABLET | ORAL | Status: DC
  Administered 2014-05-31: 300 ug via ORAL
  Filled 2014-05-29 (×2): qty 2

## 2014-05-29 MED ORDER — POLYETHYLENE GLYCOL 3350 17 G PO PACK
17.0000 g | PACK | Freq: Two times a day (BID) | ORAL | Status: DC
  Administered 2014-05-29 – 2014-06-11 (×18): 17 g via ORAL
  Filled 2014-05-29 (×28): qty 1

## 2014-05-29 MED ORDER — ENSURE COMPLETE PO LIQD
237.0000 mL | Freq: Two times a day (BID) | ORAL | Status: DC
  Administered 2014-05-29: 237 mL via ORAL

## 2014-05-29 MED ORDER — SCOPOLAMINE 1 MG/3DAYS TD PT72
1.0000 | MEDICATED_PATCH | TRANSDERMAL | Status: DC
  Administered 2014-06-01 – 2014-06-10 (×4): 1.5 mg via TRANSDERMAL
  Filled 2014-05-29 (×4): qty 1

## 2014-05-29 MED ORDER — ANASTROZOLE 1 MG PO TABS
1.0000 mg | ORAL_TABLET | Freq: Every day | ORAL | Status: DC
  Administered 2014-05-30 – 2014-06-11 (×12): 1 mg via ORAL
  Filled 2014-05-29 (×15): qty 1

## 2014-05-29 MED ORDER — ONDANSETRON HCL 4 MG/2ML IJ SOLN
INTRAMUSCULAR | Status: AC
Start: 2014-05-29 — End: 2014-05-29
  Administered 2014-05-29: 4 mg via INTRAVENOUS
  Filled 2014-05-29: qty 2

## 2014-05-29 MED ORDER — GUAIFENESIN ER 600 MG PO TB12
600.0000 mg | ORAL_TABLET | Freq: Two times a day (BID) | ORAL | Status: DC
  Administered 2014-05-29 – 2014-06-11 (×23): 600 mg via ORAL
  Filled 2014-05-29 (×28): qty 1

## 2014-05-29 MED ORDER — HEPARIN SODIUM (PORCINE) 5000 UNIT/ML IJ SOLN: 5000.0000 [IU] | Freq: Three times a day (TID) | INTRAMUSCULAR | Status: DC

## 2014-05-29 MED ORDER — COLCHICINE 0.6 MG PO TABS
0.6000 mg | ORAL_TABLET | Freq: Two times a day (BID) | ORAL | Status: DC
  Administered 2014-05-29 – 2014-05-30 (×2): 0.6 mg via ORAL
  Filled 2014-05-29 (×4): qty 1

## 2014-05-29 MED ORDER — LIRAGLUTIDE 18 MG/3ML ~~LOC~~ SOPN
1.8000 mg | PEN_INJECTOR | Freq: Every day | SUBCUTANEOUS | Status: DC
  Administered 2014-05-29 – 2014-06-10 (×13): 1.8 mg via SUBCUTANEOUS
  Filled 2014-05-29: qty 0.3

## 2014-05-29 MED ORDER — METOCLOPRAMIDE HCL 10 MG PO TABS
10.0000 mg | ORAL_TABLET | Freq: Three times a day (TID) | ORAL | Status: DC
  Administered 2014-05-29 – 2014-06-02 (×14): 10 mg via ORAL
  Filled 2014-05-29 (×18): qty 1

## 2014-05-29 MED ORDER — NITROGLYCERIN 0.4 MG SL SUBL: 0.4000 mg | SUBLINGUAL_TABLET | SUBLINGUAL | Status: DC | PRN

## 2014-05-29 MED ORDER — BISACODYL 10 MG RE SUPP
10.0000 mg | Freq: Every day | RECTAL | Status: DC | PRN
  Administered 2014-06-07: 10 mg via RECTAL
  Filled 2014-05-29: qty 1

## 2014-05-29 MED ORDER — MOMETASONE FURO-FORMOTEROL FUM 100-5 MCG/ACT IN AERO
2.0000 | INHALATION_SPRAY | Freq: Two times a day (BID) | RESPIRATORY_TRACT | Status: DC
  Administered 2014-05-31 – 2014-06-10 (×17): 2 via RESPIRATORY_TRACT
  Filled 2014-05-29: qty 8.8

## 2014-05-29 MED ORDER — DULOXETINE HCL 60 MG PO CPEP
60.0000 mg | ORAL_CAPSULE | Freq: Two times a day (BID) | ORAL | Status: DC
  Administered 2014-05-29 – 2014-06-11 (×24): 60 mg via ORAL
  Filled 2014-05-29 (×28): qty 1

## 2014-05-29 MED ORDER — SODIUM CHLORIDE 0.9 % IV SOLN
200.0000 mg | Freq: Two times a day (BID) | INTRAVENOUS | Status: DC
  Administered 2014-05-30 (×3): 200 mg via INTRAVENOUS
  Filled 2014-05-29 (×9): qty 20

## 2014-05-29 MED ORDER — CLONIDINE HCL 0.3 MG/24HR TD PTWK
0.3000 mg | MEDICATED_PATCH | TRANSDERMAL | Status: DC
  Administered 2014-06-04: 0.3 mg via TRANSDERMAL
  Filled 2014-05-29 (×2): qty 1

## 2014-05-29 NOTE — Progress Notes (Signed)
Report given to Debora, RN in [place]. Pt to be transported per bed   Angeline Slim I 05/29/2014 3:22 PM

## 2014-05-29 NOTE — Discharge Summary (Signed)
Physician Discharge Summary  Patient ID: Morgan [name] MRN: [id number] DOB/AGE: 1940/11/10 74 y.o.  Admit date: 05/03/2014 Discharge date: 05/29/2014  Admission [place]  Discharge Diagnoses:  Active Problems:   Hypertension   Diabetes type 2, uncontrolled   Left hemiparesis   Brain mass   Acute encephalopathy   Left-sided weakness   Aphasia   Meningioma   Acute urinary retention   HLD (hyperlipidemia)   Other emphysema   Other specified hypothyroidism   Primary gout   Hyponatremia   Peripheral neuropathy   Essential hypertension   Gastroesophageal reflux disease without esophagitis   Acute respiratory failure, unspecified whether with hypoxia or hypercapnia   Discharged Condition: good  Hospital Course: Morgan [name] was initially admitted secondary to a new onset seizure. This led to imaging studies which revealed a large meningioma in the right frontal region. The tumor was resected and she did very well neurologically. However she had a significant delay in her bowel function returning to normal, along with prolonged nausea. All diagnostic studies were within normal except for a swallowing study. She was given a thickener for all fluids, but at this time is swallowing normally. She is ready for transfer to the rehab service today. Her wound is clean dry and without signs of infection. Neurologically she is moving all extremities, and has no cranial nerve deficits. She cogitating normally.   Treatments: surgery: as above  Discharge Exam: Blood pressure 141/77, pulse 112, temperature 97.5 F (36.4 C), temperature source Oral, resp. rate 20, height 5' 4"  (1.626 m), weight 100.3 kg (221 lb 1.9 oz), SpO2 96 %. General appearance: alert, cooperative and appears stated age Neurologic: Mental status: Alert, oriented, thought content appropriate Cranial nerves: normal Motor: overall weakness, non focal Coordination: normal  Disposition: Rehabilitation  service nausea    Medication List    STOP taking these medications        acetaminophen 500 MG tablet  Commonly known as:  TYLENOL     diclofenac sodium 1 % Gel  Commonly known as:  VOLTAREN     insulin regular human CONCENTRATED 500 UNIT/ML Soln injection  Commonly known as:  HUMULIN R     traMADol 50 MG tablet  Commonly known as:  ULTRAM      TAKE these medications        ACCU-CHEK SMARTVIEW test strip  Generic drug:  glucose blood  USE TO TEST BLOOD SUGAR 6 TO 8 TIMES A DAY     albuterol 90 MCG/ACT inhaler  Commonly known as:  PROVENTIL,VENTOLIN  Inhale 2 puffs into the lungs 4 (four) times daily as needed for wheezing or shortness of breath.     anastrozole 1 MG tablet  Commonly known as:  ARIMIDEX  Take 1 mg by mouth daily.     aspirin 81 MG chewable tablet  Chew 81 mg by mouth daily.     CALTRATE 600+D PO  Take 1 tablet by mouth 2 (two) times daily.     cloNIDine 0.3 MG tablet  Commonly known as:  CATAPRES  Take 0.3 mg by mouth 2 (two) times daily.     COLCRYS 0.6 MG tablet  Generic drug:  colchicine  Take 0.6 mg by mouth 2 (two) times daily as needed (gout).     DULoxetine 60 MG capsule  Commonly known as:  CYMBALTA  Take 60 mg by mouth 2 (two) times daily.     febuxostat 40 MG tablet  Commonly known as:  ULORIC  Take 40 mg  by mouth daily.     felodipine 2.5 MG 24 hr tablet  Commonly known as:  PLENDIL  Take 2.5 mg by mouth daily.     fexofenadine 180 MG tablet  Commonly known as:  ALLEGRA  Take 180 mg by mouth daily as needed for allergies or rhinitis.     Fluticasone-Salmeterol 250-50 MCG/DOSE Aepb  Commonly known as:  ADVAIR  Inhale 1 puff into the lungs every 12 (twelve) hours.     gabapentin 100 MG capsule  Commonly known as:  NEURONTIN  Take 100 mg by mouth daily.     GLUCOSAMINE PO  Take 1 tablet by mouth daily.     HYDROcodone-acetaminophen 5-325 MG per tablet  Commonly known as:  NORCO/VICODIN  Take 1 tablet by mouth every  6 (six) hours as needed for moderate pain.     ibuprofen 200 MG tablet  Commonly known as:  ADVIL,MOTRIN  Take 200 mg by mouth every 6 (six) hours as needed (pain).     LASIX 40 MG tablet  Generic drug:  furosemide  Take 40 mg by mouth 2 (two) times daily.     levothyroxine 200 MCG tablet  Commonly known as:  SYNTHROID, LEVOTHROID  Take 200-300 mcg by mouth. Brand Name Synthroid Only. Take 1 1/2 tablets (300 mg) on Sundays, take 1 tablet (200 mg) Monday thru Saturday     Liraglutide 18 MG/3ML Sopn  Inject 1.8 mg into the skin daily. Victoza     lisinopril 40 MG tablet  Commonly known as:  PRINIVIL,ZESTRIL  Take 40 mg by mouth daily.     LORazepam 0.5 MG tablet  Commonly known as:  ATIVAN  Take 0.5 mg by mouth at bedtime.     metoprolol succinate 100 MG 24 hr tablet  Commonly known as:  TOPROL-XL  Take 100 mg by mouth daily. Take with or immediately following a meal.     multivitamin with minerals Tabs tablet  Take 1 tablet by mouth daily. One A Day with Iron     nitroGLYCERIN 0.4 MG SL tablet  Commonly known as:  NITROSTAT  Place 0.4 mg under the tongue every 5 (five) minutes as needed for chest pain.     omeprazole 20 MG tablet  Commonly known as:  PRILOSEC OTC  Take 20 mg by mouth 2 (two) times daily.     senna 8.6 MG Tabs tablet  Commonly known as:  SENOKOT  Take 1 tablet by mouth daily as needed for mild constipation.     simvastatin 40 MG tablet  Commonly known as:  ZOCOR  Take 40 mg by mouth at bedtime.     vitamin B-12 1000 MCG tablet  Commonly known as:  CYANOCOBALAMIN  Take 1,000 mcg by mouth daily.           Follow-up Information    Follow up with Vernadine Coombs L, MD In 3 weeks.   Specialty:  Neurosurgery   Why:  call to make an appointment   Contact information:   [address] [place] [address] [phone]       Signed: Winfield Cunas 05/29/2014, 12:46 PM

## 2014-05-29 NOTE — Progress Notes (Signed)
Morgan Blake, MD Physician Signed Physical Medicine and Rehabilitation Consult Note 05/13/2014 6:08 AM  Related encounter: ED to Hosp-Admission (Current) from 05/03/2014 in [place] Collapse All        Physical Medicine and Rehabilitation Consult Reason for Consult: Right frontal meningioma Referring Physician: Dr. Christella Noa   HPI: Morgan [name] is a 74 y.o. right handed female with history of hypertension, left breast cancer in remission 5 years, renal cancer with nephrectomy, diabetes mellitus with peripheral neuropathy and COPD. Patient lives with her husband and he uses a walker. She was independent prior to admission. Admitted 05/03/2014 with altered mental status, seizure and left-sided weakness with difficulty in speech and gaze to the left. CT MRI and imaging showed evidence of enlarging poorly characterized mass at the high right frontal parietal region measuring 5.4 x 3.3 cm with underlying vasogenic edema suspect meningioma. EEG shows with slowing of cerebral activity diffusely more pronounced involving right hemisphere in addition to an epileptiform disturbance involving the right hemisphere. Patient was loaded with Vimpat as well as Keppra. Neurosurgery consulted underwent right frontal stereotactic craniotomy for tumor resection 05/08/2014 per Dr. Cyndy Freeze. Patient did require intubation for a short time followed by critical care medicine. Decadron protocol as directed. Patient is tolerating a regular consistency diet. Physical therapy evaluation completed 05/12/2014 with recommendations of physical medicine rehabilitation consult.  Pt has been nauseated over the last 24-48 h per daughter who is at bedside  Review of Systems  Cardiovascular: Positive for leg swelling.  Gastrointestinal: Positive for constipation.  Neurological:   Vertigo  Psychiatric/Behavioral: Positive for depression.  All other systems reviewed  and are negative.  Past Medical History  Diagnosis Date  . Diabetes mellitus   . Obesity   . Diabetes type 2, uncontrolled   . Hypoglycemia associated with diabetes   . Edema of both legs   . Hypertension   . Combined hyperlipidemia   . Acquired autoimmune hypothyroidism   . Thyroiditis, autoimmune   . Abnormal liver function tests   . Sleep apnea, obstructive   . History of gastroesophageal reflux (GERD)   . Depression   . DM neuropathy with neurologic complication   . COPD with acute exacerbation   . Goiter   . Fatigue   . Vertigo   . Type II diabetes mellitus with peripheral angiopathy   . Gout   . Pallor    Past Surgical History  Procedure Laterality Date  . Laparoscopic gastric banding    . Back surgery    . Craniotomy Right 05/08/2014    Procedure: CRANIOTOMY FOR MENINGIOMA; Surgeon: Ashok Pall, MD; Location: [facility]; Service: Neurosurgery; Laterality: Right; Right Craniotomy for meningioma   Family History  Problem Relation Age of Onset  . Diabetes Father   . Diabetes Sister   . Diabetes Brother    Social History:  reports that she has never smoked. She does not have any smokeless tobacco history on file. She reports that she does not drink alcohol or use illicit drugs. Allergies:  Allergies  Allergen Reactions  . Latex Rash  . Sulfa Antibiotics Other (See Comments)    Unknown allergic reaction  . Penicillins Rash   Medications Prior to Admission  Medication Sig Dispense Refill  . acetaminophen (TYLENOL) 500 MG tablet Take 1,000 mg by mouth every 6 (six) hours as needed (pain).    Marland Kitchen albuterol (PROVENTIL,VENTOLIN) 90 MCG/ACT inhaler Inhale 2 puffs into the lungs 4 (  four) times daily as needed for wheezing or shortness of breath.     . anastrozole (ARIMIDEX) 1 MG tablet Take 1 mg by mouth daily.     Marland Kitchen aspirin 81  MG chewable tablet Chew 81 mg by mouth daily.     . Calcium Carbonate-Vitamin D (CALTRATE 600+D PO) Take 1 tablet by mouth 2 (two) times daily.     . cloNIDine (CATAPRES) 0.3 MG tablet Take 0.3 mg by mouth 2 (two) times daily.     . colchicine (COLCRYS) 0.6 MG tablet Take 0.6 mg by mouth 2 (two) times daily as needed (gout).     Marland Kitchen diclofenac sodium (VOLTAREN) 1 % GEL Apply 1 application topically 4 (four) times daily.    . DULoxetine (CYMBALTA) 60 MG capsule Take 60 mg by mouth 2 (two) times daily.     . febuxostat (ULORIC) 40 MG tablet Take 40 mg by mouth daily.     . felodipine (PLENDIL) 2.5 MG 24 hr tablet Take 2.5 mg by mouth daily.     . fexofenadine (ALLEGRA) 180 MG tablet Take 180 mg by mouth daily as needed for allergies or rhinitis.    . Fluticasone-Salmeterol (ADVAIR) 250-50 MCG/DOSE AEPB Inhale 1 puff into the lungs every 12 (twelve) hours.     . furosemide (LASIX) 40 MG tablet Take 40 mg by mouth 2 (two) times daily.     Marland Kitchen gabapentin (NEURONTIN) 100 MG capsule Take 100 mg by mouth daily.     . Glucosamine HCl (GLUCOSAMINE PO) Take 1 tablet by mouth daily.    Marland Kitchen HYDROcodone-acetaminophen (NORCO/VICODIN) 5-325 MG per tablet Take 1 tablet by mouth every 6 (six) hours as needed for moderate pain.    Marland Kitchen ibuprofen (ADVIL,MOTRIN) 200 MG tablet Take 200 mg by mouth every 6 (six) hours as needed (pain).    . insulin regular human CONCENTRATED (HUMULIN R) 500 UNIT/ML SOLN injection Inject 4-18 Units into the skin 3 (three) times daily with meals. CBG less than 70, treat the low blood sugar and recheck in 15 min. 70-90 4-6 units, 91-130 6-10 units, 131-150 7-11 units, 151-200 8-12 units, 201-250 9-13 units, 251-300 10-14 units, 301-350 11-15 units, 351-400 12-16 units, 401-450 13-17 units >450 14-18 units    . levothyroxine (SYNTHROID, LEVOTHROID) 200 MCG tablet Take 200-300 mcg by mouth. Brand Name Synthroid Only.  Take 1 1/2 tablets (300 mg) on Sundays, take 1 tablet (200 mg) Monday thru Saturday    . Liraglutide 18 MG/3ML SOPN Inject 1.8 mg into the skin daily. Victoza    . lisinopril (PRINIVIL,ZESTRIL) 40 MG tablet Take 40 mg by mouth daily.   3  . LORazepam (ATIVAN) 0.5 MG tablet Take 0.5 mg by mouth at bedtime.     . metoprolol succinate (TOPROL-XL) 100 MG 24 hr tablet Take 100 mg by mouth daily. Take with or immediately following a meal.    . Multiple Vitamin (MULTIVITAMIN WITH MINERALS) TABS tablet Take 1 tablet by mouth daily. Centrum Chewable    . Multiple Vitamin (MULTIVITAMIN WITH MINERALS) TABS tablet Take 1 tablet by mouth daily. One A Day with Iron    . nitroGLYCERIN (NITROSTAT) 0.4 MG SL tablet Place 0.4 mg under the tongue every 5 (five) minutes as needed for chest pain.     Marland Kitchen omeprazole (PRILOSEC OTC) 20 MG tablet Take 20 mg by mouth 2 (two) times daily.    Marland Kitchen senna (SENOKOT) 8.6 MG TABS tablet Take 1 tablet by mouth daily as needed for mild constipation.    Marland Kitchen  simvastatin (ZOCOR) 40 MG tablet Take 40 mg by mouth at bedtime.     . traMADol (ULTRAM) 50 MG tablet Take 50 mg by mouth 3 (three) times daily as needed (pain).    . vitamin B-12 (CYANOCOBALAMIN) 1000 MCG tablet Take 1,000 mcg by mouth daily.    Marland Kitchen ACCU-CHEK SMARTVIEW test strip USE TO TEST BLOOD SUGAR 6 TO 8 TIMES A DAY 200 each 3    Home: Home Living Family/patient expects to be discharged to:: Inpatient rehab  Functional History: Prior Function Level of Independence: Independent Functional Status:  Mobility: Bed Mobility Overal bed mobility: Needs Assistance, +2 for physical assistance Bed Mobility: Supine to Sit, Sit to Supine Supine to sit: Max assist, +2 for physical assistance Sit to supine: Max assist, +2 for physical assistance General bed mobility comments: pt assists with mobility, though generally weak and debilitated.         ADL:     Cognition: Cognition Overall Cognitive Status: Difficult to assess Orientation Level: Oriented X4 Cognition Arousal/Alertness: Lethargic Behavior During Therapy: Flat affect Overall Cognitive Status: Difficult to assess Difficult to assess due to: Level of arousal  Blood pressure 147/60, pulse 66, temperature 97.6 F (36.4 C), temperature source Axillary, resp. rate 16, height 5\' 4"  (1.626 m), weight 108.1 kg (238 lb 5.1 oz), SpO2 98 %. Physical Exam  Vitals reviewed. Constitutional: She appears well-developed.  HENT:  Craniotomy site clean and dry  Eyes:  Pupils round and reactive to light without nystagmus  Neck: Normal range of motion. Neck supple. No thyromegaly present.  Cardiovascular: Normal rate and regular rhythm.  Respiratory: Effort normal and breath sounds normal. No respiratory distress.  GI: Soft. Bowel sounds are normal. She exhibits no distension.  Neurological: She is alert.  Mood is flat but appropriate. She is able to provide her name and age and date of birth. Follow simple commands  Motor 4/5 In BUE and BLE Sensation reduced in LLE to LT   Lab Results Last 24 Hours    Results for orders placed or performed during the hospital encounter of 05/03/14 (from the past 24 hour(s))  Glucose, capillary Status: Abnormal   Collection Time: 05/12/14 7:38 AM  Result Value Ref Range   Glucose-Capillary 260 (H) 70 - 99 mg/dL  Glucose, capillary Status: Abnormal   Collection Time: 05/12/14 12:03 PM  Result Value Ref Range   Glucose-Capillary 203 (H) 70 - 99 mg/dL  Glucose, capillary Status: Abnormal   Collection Time: 05/12/14 4:22 PM  Result Value Ref Range   Glucose-Capillary 170 (H) 70 - 99 mg/dL  Glucose, capillary Status: Abnormal   Collection Time: 05/12/14 7:25 PM  Result Value Ref Range   Glucose-Capillary 201 (H) 70 - 99 mg/dL  Glucose, capillary Status: Abnormal   Collection Time:  05/12/14 10:08 PM  Result Value Ref Range   Glucose-Capillary 258 (H) 70 - 99 mg/dL   Comment 1 Documented in Chart    Comment 2 Notify RN       Imaging Results (Last 48 hours)    No results found.    Assessment/Plan: Diagnosis: Right frontal mengioma s/p resection 1. Does the need for close, 24 hr/day medical supervision in concert with the patient's rehab needs make it unreasonable for this patient to be served in a less intensive setting? Yes 2. Co-Morbidities requiring supervision/potential complications: DM, Urinary retention, DM, PN 3. Due to bladder management, bowel management, safety, skin/wound care, disease management, medication administration, pain management and patient education, does the patient require  24 hr/day rehab nursing? Yes 4. Does the patient require coordinated care of a physician, rehab nurse, PT (1-2 hrs/day, 5 days/week), OT (1-2 hrs/day, 5 days/week) and SLP (.5-1 hrs/day, 5 days/week) to address physical and functional deficits in the context of the above medical diagnosis(es)? Yes Addressing deficits in the following areas: balance, endurance, locomotion, strength, transferring, bowel/bladder control, bathing, dressing, feeding, grooming, toileting, cognition, speech, language and swallowing 5. Can the patient actively participate in an intensive therapy program of at least 3 hrs of therapy per day at least 5 days per week? Yes 6. The potential for patient to make measurable gains while on inpatient rehab is good 7. Anticipated functional outcomes upon discharge from inpatient rehab are min assist with PT, min assist with OT, min assist with SLP. 8. Estimated rehab length of stay to reach the above functional goals is: 14-18 d 9. Does the patient have adequate social supports and living environment to accommodate these discharge functional goals? Yes 10. Anticipated D/C setting: Home vs SNF 11. Anticipated post D/C treatments: SNF vs HH  therapies 12. Overall Rehab/Functional Prognosis: good  RECOMMENDATIONS: This patient's condition is appropriate for continued rehabilitative care in the following setting: CIR Patient has agreed to participate in recommended program. N/A Note that insurance prior authorization may be required for reimbursement for recommended care.  Comment: Nausea and activity need to improve prior to CIR, may be a reduce burden of care admission, husband still recovering from Dante    05/13/2014       Revision History     Date/Time User Provider Type Action   05/13/2014 11:57 AM Morgan Blake, MD Physician Sign   05/13/2014 6:35 AM Cathlyn Parsons, PA-C Physician Assistant [place]   View Details Report       Routing History     Date/Time From To Method   05/13/2014 11:57 AM Morgan Blake, MD Morgan Blake, MD In Basket   05/13/2014 11:57 AM Morgan Blake, MD [name], MD Fax

## 2014-05-29 NOTE — Progress Notes (Signed)
NUTRITION FOLLOW UP  Intervention:   Encourage PO intake as tolerated Provide Ensure Complete (strawberry) BID in between meals, each supplement provides 350 kcal and 13 grams of protein  Nutrition Dx:   Inadequate oral intake related to nausea and vomiting as evidenced by pt's report and 17 lb weight loss; ongoing  New Goal: Pt to meet >/= 90% of their estimated nutrition needs; unmet  Monitor:   PO intake, weight trend, labs  Assessment:   Pt admitted 1/24 with AMS, found to have seizures in the setting of meningioma. S/p resection 1/29 and remained intubated.   Per nursing notes, pt ate 20% of breakfast this morning. Per daughter, after pt walked from window to bed she had small amount of vomiting- mostly coffee, some oatmeal. However, pt did not have her scopolamine patch on today which usually helps with pt's nausea. Daughter also report that patient drank 2 Strawberry Ensure yesterday. SLP at bedside to see if patient's diet textures/liquids can be advanced; upgraded to Dysphagia 3 diet now. Pt agreeable to continuing Strawberry Ensure.   Labs: elevated glucose, low sodium, low hemoglobin, high alkaline phosphatase, elevated AST and ALT; magnesium and phosphorus WNL  Height: Ht Readings from Last 1 Encounters:  05/08/14 5\' 4"  (1.626 m)    Weight Status:   Wt Readings from Last 1 Encounters:  05/19/14 221 lb 1.9 oz (100.3 kg)  05/12/14 238 lbs  Re-estimated needs:  Kcal: [id number] Protein: 95-110 grams Fluid: 2.8 L/day  Skin: closed incision on head; +1 facial edema and RUE edema, +1 generalized edema  Diet Order: DIET DYS 3, Nectar-thick liquids   Intake/Output Summary (Last 24 hours) at 05/29/14 1245 Last data filed at 05/29/14 1036  Gross per 24 hour  Intake    500 ml  Output      0 ml  Net    500 ml    Last BM: 2/17   Labs:   Recent Labs Lab 05/25/14 0430 05/26/14 0549 05/28/14 0829 05/29/14 0746  NA 133* 134* 134* 133*  K 3.5 3.5 4.1 3.9  CL  98 97 96 97  CO2 30 30 32 28  BUN 20 25* 19 14  CREATININE 0.99 1.10 0.98 0.99  CALCIUM 8.0* 7.9* 8.4 8.3*  MG 1.6  --  1.5 1.6  PHOS 3.5  --  4.1 3.9  GLUCOSE 181* 122* 152* 167*    CBG (last 3)   Recent Labs  05/29/14 0400 05/29/14 0809 05/29/14 1116  GLUCAP 140* 165* 252*    Scheduled Meds: . anastrozole  1 mg Oral Daily  . antiseptic oral rinse  7 mL Mouth Rinse BID  . cloNIDine  0.3 mg Transdermal Weekly  . colchicine  0.6 mg Oral BID  . DULoxetine  60 mg Oral BID  . furosemide  40 mg Oral BID  . guaiFENesin  600 mg Oral BID  . heparin subcutaneous  5,000 Units Subcutaneous 3 times per day  . insulin aspart  0-20 Units Subcutaneous 6 times per day  . lacosamide (VIMPAT) IV  200 mg Intravenous Q12H  . levETIRAcetam  500 mg Intravenous 3 times per day  . levothyroxine  200 mcg Oral Once per day on Mon Tue Wed Thu Fri Sat  . levothyroxine  300 mcg Oral Once every 7 days  . Liraglutide  1.8 mg Subcutaneous Daily  . metoCLOPramide (REGLAN) injection  10 mg Intravenous 4 times per day  . mometasone-formoterol  2 puff Inhalation BID  . pantoprazole  40 mg  Oral BID  . polyethylene glycol  17 g Oral BID  . scopolamine  1 patch Transdermal Q72H    Continuous Infusions: . sodium chloride 10 mL/hr at 05/29/14 0219  . lactated ringers 75 mL/hr at 05/27/14 [place], LDN Inpatient Clinical Dietitian Pager: [phone] After Hours Pager: [phone]

## 2014-05-29 NOTE — Progress Notes (Signed)
UR complete.  Kaleo Condrey RN, MSN 

## 2014-05-29 NOTE — Progress Notes (Signed)
Morgan Burke, RN Rehab Admission Coordinator Signed Physical Medicine and Rehabilitation PMR Pre-admission 05/15/2014 2:00 PM  Related encounter: ED to Hosp-Admission (Current) from 05/03/2014 in [place]   PMR Admission Coordinator Pre-Admission Assessment  Patient: Morgan [name] is an 74 y.o., female MRN: [id number] DOB: [birth year] Height: 5\' 4"  (162.6 cm) Weight: 100.3 kg (221 lb 1.9 oz)  Insurance Information HMO: PPO: PCP: IPA: 80/20: yes OTHER: no HMO PRIMARY: Medicare a and b Policy#: [id number] Subscriber: pt Benefits: Phone #: palmetto online Name:05/12/14 Eff. Date: 12/09/05 Deduct: $1288 Out of Pocket Max: none Life Max: none CIR: 100% SNF: 20 full days Outpatient: 80% Co-Pay: 20% Home Health: 100% Co-Pay: none DME: 80% Co-Pay: 20% Providers: pt choice  SECONDARY: BCBS supplement Policy#: [id number] Subscriber: pt  Medicaid Application Date: Case Manager:  Disability Application Date: Case Worker:   Emergency Contact Information Contact Information    Name Relation Home Work Mobile   Morgan [name] [phone]  [phone]   [name],Morgan Daughter   [phone]   Morgan, [name]   [phone]     Current Medical History  Patient Admitting Diagnosis: right frontal mengioma s/p resection  History of Present Illness: Morgan [name] is a 74 y.o. right handed female with history of hypertension, left breast cancer in remission 5 years, renal cancer with nephrectomy, diabetes mellitus with peripheral neuropathy and COPD. Patient lives with her husband and he uses a walker. She was independent prior to admission.   Admitted  05/03/2014 with altered mental status, seizure and left-sided weakness with difficulty in speech and gaze to the left. CT MRI and imaging showed evidence of enlarging poorly characterized mass at the high right frontal parietal region measuring 5.4 x 3.3 cm with underlying vasogenic edema suspect meningioma. EEG shows with slowing of cerebral activity diffusely more pronounced involving right hemisphere in addition to an epileptiform disturbance involving the right hemisphere. Patient was loaded with Vimpat as well as Keppra. Neurosurgery consulted underwent right frontal stereotactic craniotomy for tumor resection 05/08/2014 per Dr. Cyndy [name]. Patient did require intubation for a short time followed by critical care medicine. Decadron protocol as directed.   Pt has been nauseated postoperatively with constipation with NGT placement 05/14/14 pm and GI consulted 05/18/14. Reglan started as well as TNA and Miralax. EGD 2/12 negative. First BM since admission was 2/16 and another BM 2/17. TNA and NGT discontinued. Pt with dysphagia but states fodd textures make her nauseated at this time. reports cough with eating pta due to her hiatal hernia. GI signed off on 05/28/14.  Total: 5 NIH  Past Medical History  Past Medical History  Diagnosis Date  . Diabetes mellitus   . Obesity   . Diabetes type 2, uncontrolled   . Hypoglycemia associated with diabetes   . Edema of both legs   . Hypertension   . Combined hyperlipidemia   . Acquired autoimmune hypothyroidism   . Thyroiditis, autoimmune   . Abnormal liver function tests   . Sleep apnea, obstructive   . History of gastroesophageal reflux (GERD)   . Depression   . DM neuropathy with neurologic complication   . COPD with acute exacerbation   . Goiter   . Fatigue   . Vertigo   . Type II diabetes mellitus with peripheral angiopathy   . Gout   . Pallor     Family History  family history  includes Diabetes in her brother, father, and sister.  Prior  Rehab/Hospitalizations: HH only  Current Medications   Current facility-administered medications:  . 0.9 % sodium chloride infusion, , Intravenous, Continuous, Morgan Mcardle, MD, Last Rate: 10 mL/hr at 05/29/14 0219 . acetaminophen (TYLENOL) suppository 650 mg, 650 mg, Rectal, Q6H PRN, Roland Rack, MD . acetaminophen (TYLENOL) tablet 650 mg, 650 mg, Oral, Q6H PRN, Cherene Altes, MD, 650 mg at 05/13/14 1132 . acetaminophen-codeine (TYLENOL #3) 300-30 MG per tablet 1 tablet, 1 tablet, Oral, Q4H PRN, Ashok Pall, MD, 1 tablet at 05/23/14 1736 . anastrozole (ARIMIDEX) tablet 1 mg, 1 mg, Oral, Daily, Cherene Altes, MD, 1 mg at 05/29/14 1032 . antiseptic oral rinse (CPC / CETYLPYRIDINIUM CHLORIDE 0.05%) solution 7 mL, 7 mL, Mouth Rinse, BID, Ashok Pall, MD, 7 mL at 05/29/14 1000 . artificial tears (LACRILUBE) ophthalmic ointment, , Both Eyes, Q4H PRN, Ashok Pall, MD, 1 application at [id number] . bisacodyl (DULCOLAX) suppository 10 mg, 10 mg, Rectal, Daily PRN, Winfield Cunas., MD . cloNIDine (CATAPRES - Dosed in mg/24 hr) patch 0.3 mg, 0.3 mg, Transdermal, Weekly, Ashok Pall, MD, 0.3 mg at 05/28/14 2230 . colchicine tablet 0.6 mg, 0.6 mg, Oral, BID, Ashok Pall, MD, 0.6 mg at 05/29/14 1032 . DULoxetine (CYMBALTA) DR capsule 60 mg, 60 mg, Oral, BID, Floyce Stakes, MD, 60 mg at 05/29/14 1032 . furosemide (LASIX) tablet 40 mg, 40 mg, Oral, BID, Ashok Pall, MD, 40 mg at 05/29/14 0826 . guaiFENesin (MUCINEX) 12 hr tablet 600 mg, 600 mg, Oral, BID, Floyce Stakes, MD, 600 mg at 05/29/14 1032 . guaifenesin (ROBITUSSIN) 100 MG/5ML syrup 400 mg, 400 mg, Oral, Q6H PRN, Floyce Stakes, MD, 400 mg at 05/25/14 1202 . heparin injection 5,000 Units, 5,000 Units, Subcutaneous, 3 times per day, Ashok Pall, MD, 5,000 Units at 05/29/14 0541 . HYDROcodone-acetaminophen (NORCO/VICODIN) 5-325 MG per  tablet 1 tablet, 1 tablet, Oral, Q6H PRN, Ashok Pall, MD . insulin aspart (novoLOG) injection 0-20 Units, 0-20 Units, Subcutaneous, 6 times per day, Saundra Shelling, [facility], 11 Units at 05/29/14 1138 . lacosamide (VIMPAT) 200 mg in sodium chloride 0.9 % 25 mL IVPB, 200 mg, Intravenous, Q12H, Roland Rack, MD, 200 mg at 05/29/14 0839 . lactated ringers infusion, , Intravenous, Continuous, Newman Pies, MD, Last Rate: 75 mL/hr at 05/27/14 1959 . levETIRAcetam (KEPPRA) IVPB 500 mg/100 mL premix, 500 mg, Intravenous, 3 times per day, Ashok Pall, MD, 500 mg at 05/29/14 0541 . levothyroxine (SYNTHROID, LEVOTHROID) tablet 200 mcg, 200 mcg, Oral, Once per day on Mon Tue Wed Thu Fri Sat, Ashok Pall, MD, 200 mcg at 05/29/14 0841 . levothyroxine (SYNTHROID, LEVOTHROID) tablet 300 mcg, 300 mcg, Oral, Once every 7 days, Ashok Pall, MD, 300 mcg at 05/24/14 0805 . Liraglutide SOPN 1.8 mg, 1.8 mg, Subcutaneous, Daily, Ashok Pall, MD, 1.8 mg at 05/28/14 2028 . metoCLOPramide (REGLAN) injection 10 mg, 10 mg, Intravenous, 4 times per day, Winfield Cunas., MD, 10 mg at 05/29/14 1136 . mometasone-formoterol (DULERA) 100-5 MCG/ACT inhaler 2 puff, 2 puff, Inhalation, BID, Ashok Pall, MD, 2 puff at 05/29/14 0729 . nitroGLYCERIN (NITROSTAT) SL tablet 0.4 mg, 0.4 mg, Sublingual, Q5 min PRN, Ashok Pall, MD . ondansetron [facility]) injection 4 mg, 4 mg, Intravenous, Q6H PRN, Charlie Pitter, MD, 4 mg at 05/29/14 1033 . oxyCODONE (Oxy IR/ROXICODONE) immediate release tablet 5-10 mg, 5-10 mg, Oral, Q4H PRN, Ashok Pall, MD, 5 mg at 05/24/14 2219 . pantoprazole (PROTONIX) EC tablet 40 mg, 40 mg, Oral, BID, Ashok Pall, MD . phenol [facility])  mouth spray 1 spray, 1 spray, Mouth/Throat, PRN, Faythe Ghee, MD, 1 spray at 05/15/14 0730 . polyethylene glycol (MIRALAX / GLYCOLAX) packet 17 g, 17 g, Oral, BID, Wonda Horner, MD, 17 g at 05/29/14 1031 . prochlorperazine (COMPAZINE) suppository  25 mg, 25 mg, Rectal, Q12H PRN, Ashok Pall, MD, 25 mg at 05/13/14 1543 . promethazine (PHENERGAN) injection 12.5 mg, 12.5 mg, Intravenous, Q6H PRN, Erline Levine, MD, 12.5 mg at 05/17/14 2228 . RESOURCE THICKENUP CLEAR, , Oral, PRN, Ashok Pall, MD . scopolamine (TRANSDERM-SCOP) 1 MG/3DAYS 1.5 mg, 1 patch, Transdermal, Q72H, Ashok Pall, MD, 1.5 mg at 05/29/14 1218 . sodium chloride 0.9 % injection 10-40 mL, 10-40 mL, Intracatheter, PRN, Ashok Pall, MD, 10 mL at 05/26/14 0548  Patients Current Diet: DIET DYS 3 With nectar thick liquids SLP reeval 05/29/14 at bedside pending  Precautions / Restrictions Precautions Precautions: Fall Restrictions Weight Bearing Restrictions: No   Prior Activity Level Pt ambulated with a cane pta daughter had noted some memory issues over the past few months Independent with adls. Daughter reports frequent falls pta with a gradual decline in balance  Home Assistive Devices / Equipment Home Assistive Devices/Equipment: Cane (specify quad or straight)  Prior Functional Level Prior Function Level of Independence: Independent  Current Functional Level Cognition  Overall Cognitive Status: Impaired/Different from baseline Difficult to assess due to: Level of arousal Current Attention Level: Focused Orientation Level: Oriented X4 Following Commands: Follows multi-step commands with increased time Safety/Judgement: Decreased awareness of safety, Decreased awareness of deficits General Comments: Patient would close eyes at times but would still respond to questions and task   Extremity Assessment (includes Sensation/Coordination)  Upper Extremity Assessment: RUE deficits/detail, LUE deficits/detail RUE Deficits / Details: patient reports shoulder surgery "a couple years ago", limited ROM and decreased strength throughout shoulder LUE Deficits / Details: patient reports rotator cuff surgery in June 2015, limited ROM and decreased strength  throughout shoulder   Lower Extremity Assessment: Defer to PT evaluation    ADLs  Overall ADL's : Needs assistance/impaired Eating/Feeding: Supervision/ safety, Sitting Grooming: Supervision/safety, Sitting Grooming Details (indicate cue type and reason): unsupported EOB Upper Body Bathing: Sitting, Minimal assitance Upper Body Bathing Details (indicate cue type and reason): sitting unsupported EOB, supervision to maintain sitting balance Lower Body Bathing: Moderate assistance, Sit to/from stand Upper Body Dressing : Minimal assistance, Sitting Upper Body Dressing Details (indicate cue type and reason): sitting unsuported EOB; supervision to maintain sitting balance Lower Body Dressing: Moderate assistance, Sit to/from stand, Cueing for safety Toilet Transfer: Moderate assistance, RW, BSC, Stand-pivot Toileting- Clothing Manipulation and Hygiene: Total assistance, +2 for physical assistance, Sit to/from stand (with use of stedy and min A to stand) Functional mobility during ADLs: +2 for physical assistance, Maximal assistance, Cueing for safety, Cueing for sequencing General ADL Comments: Patient motivated to work with therapist. Patient with bad news yesterday, hearing 6 friends have passed in the past couple weeks; patient emotional during session secondary to this. patient sat EOB using bed rails with supervision. While seated EOB, patient worked on crossing [place] in order to increase independence with LB ADLs. Patient stood using RW with mod assist (1 person). Patient then performed stand pivot transfer into recliner with mod assist and mod verbal cues for safety, technique, and hand placement. Patient's eyes were open more, but required verbal cues to keep them opened. Patient's son present and provided support as needed and appropriately.     Mobility  Overal bed mobility: Needs Assistance Bed Mobility: Rolling, Sidelying  to Sit Rolling: Modified independent (Device/Increase  time) Sidelying to sit: Supervision Supine to sit: Max assist, +2 for physical assistance Sit to supine: Max assist, +2 for physical assistance Sit to sidelying: Mod assist, +2 for physical assistance General bed mobility comments: Patient rolled towards L side with use of rails. Cues for positoining and technique    Transfers  Overall transfer level: Needs assistance Equipment used: Rolling walker (2 wheeled) Transfers: Sit to/from Stand, Stand Pivot Transfers Sit to Stand: Mod assist Stand pivot transfers: Mod assist Squat pivot transfers: Max assist, +2 physical assistance General transfer comment: Cues required for safety, technique, and hand placement. Mod A for power up and to ensure safety    Ambulation / Gait / Stairs / Wheelchair Mobility  Ambulation/Gait Ambulation/Gait assistance: Min assist, Mod assist Ambulation Distance (Feet): 12 Feet Assistive device: Rolling walker (2 wheeled) Gait Pattern/deviations: Shuffle, Step-to pattern, Decreased dorsiflexion - left, Decreased weight shift to right, Decreased weight shift to left, Decreased dorsiflexion - right, Narrow base of support Gait velocity interpretation: Below normal speed for age/gender General Gait Details: Patient able to walk with use of RW. Mod A initailly but progressed to Min A. Chair to follow for safety. Cues for posture and use of RW.     Posture / Balance Dynamic Sitting Balance Sitting balance - Comments: needed tactile and vc's to maintain upright posture and lift head, open eyes. pt. lethargic Balance Overall balance assessment: Needs assistance Sitting-balance support: Bilateral upper extremity supported, Feet supported Sitting balance-Leahy Scale: Fair Sitting balance - Comments: needed tactile and vc's to maintain upright posture and lift head, open eyes. pt. lethargic Postural control: Left lateral lean Standing balance support: Bilateral upper extremity supported, During functional  activity Standing balance-Leahy Scale: Poor Standing balance comment: patient requires hold to maintain balance    Special needs/care consideration   Bowel mgmt: 05/27/14 Bladder mgmt: continent Diabetic mgmt yes uses Victoza at home. Uncontrolled pta    Previous Home Environment Living Arrangements: [name]/significant other Lives With: [name] Available Help at Discharge: Family, Available 24 hours/day Type of Home: House Home Layout: One level Home Access: Ramped entrance, Other (comment) (due to [name] injuried, ramp has been made. comes through on) Bathroom Shower/Tub: Multimedia programmer: Handicapped height Bathroom Accessibility: Yes How Accessible: Accessible via walker (has handicapped rails recently ) [place]: No Additional Comments: pt's [name] has been receiving HH with [facility] Pt and [name] were independent [place]. [name] with recent injury as he fell from a bucket loader as he was trimming trees with his son Airlifted to [facility] with multi trauma. He received his rehab at [facility] in [place] and is now home Mod I on [place] check on him very frequently since Mom is hopitalized.  Discharge Living Setting Plans for Discharge Living Setting: Patient's home, Lives with (comment), Other (Comment) ([name]) Type of Home at Discharge: House Discharge Home Layout: One level Discharge Home Access: [place] entrance Discharge Bathroom Shower/Tub: Walk-in shower Discharge Bathroom Toilet: Handicapped height Discharge Bathroom Accessibility: Yes How Accessible: Accessible via walker Does the patient have any problems obtaining your medications?: No  Social/Family/Support Systems Patient Roles: [name], Parent Contact Information: Marchelle Huish, [name] . Son and dtr very supportive and with pt 24/7 in hospital Anticipated Caregiver: son and daughter Anticipated Caregiver's Contact Information: see above Ability/Limitations  of Caregiver: [name] recent injury and so son and daughter will have to assist Caregiver Availability: 24/7 Discharge Plan Discussed with Primary Caregiver: Yes Is Caregiver In  Agreement with Plan?: Yes Does Caregiver/Family have Issues with Lodging/Transportation while Pt is in Rehab?: No [name] has a bedroom set up in living room over their home with hospital bed. Pt likely will be able to go directly to her own bedroom.   Goals/Additional Needs Patient/Family Goal for Rehab: supervision with PT, OT, and SLP Expected length of stay: ELSO 10 to 14 days Dietary Needs: SLP eval at bedside 05/29/14 Pt/Family Agrees to Admission and willing to participate: Yes Program Orientation Provided & Reviewed with Pt/Caregiver Including Roles & Responsibilities: Yes  Decrease burden of Care through IP rehab admission: n/a  Possible need for SNF placement upon discharge: I have spoken with daughter at bedside of the possibility of receiving inpt rehab care and then progressing to SNF rehab to finish her course of rehab for maximum benefit prior to return home due to her needs and the needs of her [name]. Daughter is open to that possibility. Pt has greatly improved over past two weeks. Likely can d/c directly home.  Patient Condition: This patient's medical and functional status has changed since the consult dated: 05/12/2014 in which the Rehabilitation Physician determined and documented that the patient's condition is appropriate for intensive rehabilitative care in an inpatient rehabilitation facility. See "History of Present Illness" (above) for medical update. Functional changes are: overall mod assist. Patient's medical and functional status update has been discussed with the Rehabilitation physician and patient remains appropriate for inpatient rehabilitation. Will admit to inpatient rehab today.  Preadmission Screen Completed By: Morgan [name], 05/29/2014 12:32  PM ______________________________________________________________________  Discussed status with Dr. Naaman Plummer on 05/29/14 at 1232 and received telephone approval for admission today.  Admission Coordinator: Morgan [name], time [id number] Date 05/29/2014.          Cosigned by: Meredith Staggers, MD at 05/29/2014 1:28 PM  Revision History     Date/Time User Provider Type Action   05/29/2014 1:28 PM Meredith Staggers, MD Physician Cosign   05/29/2014 12:33 PM Morgan Burke, RN Rehab Admission Coordinator Sign   05/29/2014 12:29 PM Morgan Burke, RN Rehab Admission Coordinator Share   05/29/2014 9:42 AM Morgan Burke, RN Rehab Admission Coordinator Share   05/29/2014 8:21 AM Morgan Burke, RN Rehab Admission Coordinator Share   05/15/2014 2:11 PM Morgan Burke, RN Rehab Admission Coordinator Share   05/15/2014 2:11 PM Morgan Burke, RN Rehab Admission Coordinator Share   View Details Report

## 2014-05-29 NOTE — Progress Notes (Signed)
Pt has made great progress over the past 48 hours. I discussed with Dr. Naaman Plummer and Dr. Christella Noa. We can admit pt to inpt rehab today. I met with pt and her daughter at bedside and they are in agreement. [phone]
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
## 2014-05-29 NOTE — Progress Notes (Signed)
Speech Language Pathology Treatment: Dysphagia  Patient Details Name: Morgan [name] MRN: [id number] DOB: May 06, 1940 Today's Date: 05/29/2014 Time: [id number] SLP Time Calculation (min) (ACUTE ONLY): 32 min  Assessment / Plan / Recommendation Clinical Impression  Pt seen for dysphagia therapy for possible diet/liquid upgrade with daughter at bedside. Daughter admits to allowing pt thin coffee this morning and [place]. She reported "pt has a hiatal hernia and not unusual for her to cough during meals."  Discussed MBS results re: delayed cough after aspiration, however sensation sensation may be intermittent and not consistent. Lung sounds have remained stable since 2/16, no recent CXR. Pt unable to recall swallow strategies. Mastication of solid mildly delayed exhibiting a "munch chew" pattern versus rotary and daughter reports "she is missing teeth in the back" and affirmed mastication present prior to hospitalization. Immediate cough after initial cup sip thin. She required max verba/tactile/visual assist to take very small sips. No additional s/s aspiration with thin; pt requires additional practice with strategies. Recommend upgrade to solids to Dys 3 and continue nectar thick ([place] Ice ok with SLP- family gives small spoon bite sizes). Pt transferring to inpt rehab today and SLP may  continue trials thin at bedside versus repeating MBS. Pt. appears to be tolerating thus far, however daughter will continue to give thin liquids and objective information would assist to assess and document current swallow function.   HPI HPI: 74 year old female admitted 05/03/14, who underwent Craniotomy post Meningioma Resection. PMH significant for GERD, COPD, DM, Seizures, and VDRF. MBS ordered to assess readiness to advance diet.   Pertinent Vitals Pain Assessment: No/denies pain  SLP Plan   (continue on CIR)    Recommendations Diet recommendations: Dysphagia 3 (mechanical soft);Nectar-thick  liquid Liquids provided via: Cup;Straw Medication Administration: Crushed with puree Supervision: Patient able to self feed;Full supervision/cueing for compensatory strategies Compensations: Slow rate;Small sips/bites;Follow solids with liquid;Multiple dry swallows after each bite/sip Postural Changes and/or Swallow Maneuvers: Upright 30-60 min after meal;Seated upright 90 degrees              Oral Care Recommendations: Oral care BID Follow up Recommendations: Inpatient Rehab Plan:  (continue on CIR)    GO     Houston Siren 05/29/2014, 1:30 PM  Orbie Pyo Colvin Caroli.[employer] [phone]

## 2014-05-29 NOTE — Progress Notes (Signed)
Pt ambulated with walker with 2 person assist from chair to bedside commode and then to bed, approx 15 feet. Pt tolerated well.   Morgan [name] I 05/29/2014 10:59 AM

## 2014-05-29 NOTE — Progress Notes (Signed)
Physical Therapy Treatment Patient Details Name: Morgan [name] MRN: [id number] DOB: Jun 24, 1940 Today's Date: 05/29/2014    History of Present Illness pt presents with Craniotomy post Meningioma Resection, Seizures, and VDRF.      PT Comments    As I walked into room this morning, patient was sitting up in the bed and drinking her coffee. Patient was awake and much more alert than previous days. Patient was able to transfer to [facility] then walk to window in the room for the first time of ambulation since eval. Patient more alert and conversive. She is ready to participate in intensive therapies with hope she can return home. Daughter present throughout session and so happy with patients progress. Continue to recommend comprehensive inpatient rehab (CIR) for post-acute therapy needs.   Follow Up Recommendations  CIR     Equipment Recommendations       Recommendations for Other Services       Precautions / Restrictions Precautions Precautions: Fall Restrictions Weight Bearing Restrictions: No    Mobility  Bed Mobility Overal bed mobility: Needs Assistance     Sidelying to sit: Supervision       General bed mobility comments: Patient rolled towards L side with use of rails. Cues for positoining and technique  Transfers Overall transfer level: Needs assistance Equipment used: Rolling walker (2 wheeled)   Sit to Stand: Mod assist         General transfer comment: Cues required for safety, technique, and hand placement. Mod A for power up and to ensure safety  Ambulation/Gait Ambulation/Gait assistance: Min assist;Mod assist Ambulation Distance (Feet): 12 Feet Assistive device: Rolling walker (2 wheeled) Gait Pattern/deviations: Shuffle;Step-to pattern;Decreased dorsiflexion - left;Decreased weight shift to right;Decreased weight shift to left;Decreased dorsiflexion - right;Narrow base of support   Gait velocity interpretation: Below normal speed for  age/gender General Gait Details: Patient able to walk with use of RW. Mod A initailly but progressed to Min A. Chair to follow for safety. Cues for posture and use of RW.    Stairs            Wheelchair Mobility    Modified Rankin (Stroke Patients Only)       Balance                                    Cognition Arousal/Alertness: Awake/alert Behavior During Therapy: Flat affect Overall Cognitive Status: Impaired/Different from baseline     Current Attention Level: Focused Memory: Decreased short-term memory Following Commands: Follows multi-step commands with increased time       General Comments: Patient would close eyes at times but would still respond to questions and task    Exercises      General Comments        Pertinent Vitals/Pain Pain Score: 4  Pain Location: L shoulder  Pain Descriptors / Indicators: Aching Pain Intervention(s): Limited activity within patient's tolerance;Monitored during session    Home Living                      Prior Function            PT Goals (current goals can now be found in the care plan section) Progress towards PT goals: Progressing toward goals    Frequency  Min 3X/week    PT Plan Current plan remains appropriate    Co-evaluation  End of Session Equipment Utilized During Treatment: Gait belt Activity Tolerance: Patient tolerated treatment well Patient left: in chair;with call bell/phone within reach;with family/visitor present     Time: [id number] PT Time Calculation (min) (ACUTE ONLY): 28 min  Charges:  $Gait Training: 8-22 mins $Therapeutic Activity: 8-22 mins                    G Codes:      Jacqualyn Posey 05/29/2014, 9:20 AM 05/29/2014 Jacqualyn Posey PTA [phone] pager [phone] office

## 2014-05-29 NOTE — Interval H&P Note (Signed)
Morgan [name] was admitted today to Inpatient Rehabilitation with the diagnosis of right meningioma.  The patient's history has been reviewed, patient examined, and there is no change in status.  Patient continues to be appropriate for intensive inpatient rehabilitation.  I have reviewed the patient's chart and labs.  Questions were answered to the patient's satisfaction.  SWARTZ,ZACHARY T 05/29/2014, 4:56 PM

## 2014-05-29 NOTE — Progress Notes (Signed)
Inpatient Diabetes Program Recommendations  AACE/ADA: New Consensus Statement on Inpatient Glycemic Control (2013)  Target Ranges:  Prepandial:   less than 140 mg/dL      Peak postprandial:   less than 180 mg/dL (1-2 hours)      Critically ill patients:  140 - 180 mg/dL   Results for Morgan [name], Morgan [name] (MRN [id number]) as of 05/29/2014 10:34  Ref. Range 05/28/2014 16:20 05/28/2014 20:21 05/29/2014 00:11 05/29/2014 04:00 05/29/2014 08:09  Glucose-Capillary Latest Range: 70-99 mg/dL 212 (H) 184 (H) 132 (H) 140 (H) 165 (H)    Reason for assessment- transition from TPN and R insulin  Diabetes history: Type 2 Outpatient Diabetes medications: Humulin R U500 insulin 4-18 units tid, Victoza 1.8mg /day Current orders for Inpatient glycemic control: Victoza 1.8mg /day, Novolog 0-20 units q4h   Please consider switching patient to Lantus 15 units qday,  Novolog 3 units tid with meals and  Novolog moderate correction scale tid and hs.   Gentry Fitz, RN, BA, MHA, CDE Diabetes Coordinator Inpatient Diabetes Program  [phone] (Team Pager) [phone] [facility] Office) 05/29/2014 10:43 AM

## 2014-05-29 NOTE — Progress Notes (Signed)
Pt transported out of unit per bed with nurse tech, Estill Bamberg, and 2 student nurses to 4W. belongings were transported to 4w20 by cart. No acute distress noted. Family at side.   Angeline Slim I 05/29/2014 3:56 PM

## 2014-05-30 ENCOUNTER — Inpatient Hospital Stay (HOSPITAL_COMMUNITY): Payer: Medicare Other | Admitting: Physical Therapy

## 2014-05-30 ENCOUNTER — Inpatient Hospital Stay (HOSPITAL_COMMUNITY): Payer: Medicare Other | Admitting: Occupational Therapy

## 2014-05-30 ENCOUNTER — Inpatient Hospital Stay (HOSPITAL_COMMUNITY): Payer: Medicare Other | Admitting: Speech Pathology

## 2014-05-30 DIAGNOSIS — E1142 Type 2 diabetes mellitus with diabetic polyneuropathy: Secondary | ICD-10-CM

## 2014-05-30 LAB — GLUCOSE, CAPILLARY
GLUCOSE-CAPILLARY: 124 mg/dL — AB (ref 70–99)
GLUCOSE-CAPILLARY: 129 mg/dL — AB (ref 70–99)
GLUCOSE-CAPILLARY: 137 mg/dL — AB (ref 70–99)
GLUCOSE-CAPILLARY: 150 mg/dL — AB (ref 70–99)
Glucose-Capillary: 122 mg/dL — ABNORMAL HIGH (ref 70–99)
Glucose-Capillary: 180 mg/dL — ABNORMAL HIGH (ref 70–99)
Glucose-Capillary: 140 mg/dL — ABNORMAL HIGH (ref 70–99)

## 2014-05-30 MED ORDER — ACETAMINOPHEN 325 MG PO TABS
650.0000 mg | ORAL_TABLET | Freq: Four times a day (QID) | ORAL | Status: DC | PRN
  Administered 2014-05-30 – 2014-06-11 (×6): 650 mg via ORAL
  Filled 2014-05-30 (×6): qty 2

## 2014-05-30 MED ORDER — COLCHICINE 0.6 MG PO TABS
0.6000 mg | ORAL_TABLET | Freq: Two times a day (BID) | ORAL | Status: DC
  Administered 2014-06-01 – 2014-06-11 (×19): 0.6 mg via ORAL
  Filled 2014-05-30 (×23): qty 1

## 2014-05-30 NOTE — Progress Notes (Signed)
[place] PHYSICAL MEDICINE & REHABILITATION     PROGRESS NOTE    Subjective/Complaints: Having some nausea, moved bowels yesterday. Still not eating much.  Objective: Vital Signs: Blood pressure 141/72, pulse 114, temperature 98 F (36.7 C), temperature source Oral, resp. rate 18, SpO2 93 %. No results found.  Recent Labs  05/29/14 1940  WBC 5.0  HGB 10.2*  HCT 31.1*  PLT 151    Recent Labs  05/28/14 0829 05/29/14 0746 05/29/14 1940  NA 134* 133*  --   K 4.1 3.9  --   CL 96 97  --   GLUCOSE 152* 167*  --   BUN 19 14  --   CREATININE 0.98 0.99 1.06  CALCIUM 8.4 8.3*  --    CBG (last 3)   Recent Labs  05/30/14 0114 05/30/14 0500 05/30/14 0732  GLUCAP 122* 124* 137*    Wt Readings from Last 3 Encounters:  05/19/14 100.3 kg (221 lb 1.9 oz)  01/04/12 109.09 kg (240 lb 8 oz)  09/11/11 110.496 kg (243 lb 9.6 oz)    Physical Exam:  No distressed. Sitting with eyes closed. Opens them on command  HENT:  Craniotomy site well-healed. No edema  Eyes: Conjunctivae and EOM are normal. Pupils are equal, round, and reactive to light.  Pupils round and reactive to light without nystagmus  Neck: Normal range of motion. Neck supple. No JVD present. No tracheal deviation present. No thyromegaly present.  Cardiovascular: Normal rate and regular rhythm.  Respiratory: Effort normal and breath sounds normal. No respiratory distress. She has no wheezes. She has no rales.  GI: Soft. Bowel sounds are normal. She exhibits no distension. There is no tenderness. There is no rebound.  Musculoskeletal:  She moves all extremities  Lymphadenopathy:   She has no cervical adenopathy.  Neurological:  Mood is flat but appropriate.  Oriented to place, reason she's at hospital. f She was able to provide her name, age. UES: grossly 3-4/5 proximal to distal UE's. HF 3/5, KE 4-, ADF/APF 4+. Senses pain in all 4's. Decreased Bolt  Psychiatric: She has a normal mood and affect. Her  behavior is normal.   Assessment/Plan: 1. Functional deficits secondary to right meningioma s/p resection which require 3+ hours per day of interdisciplinary therapy in a comprehensive inpatient rehab setting. Physiatrist is providing close team supervision and 24 hour management of active medical problems listed below. Physiatrist and rehab team continue to assess barriers to discharge/monitor patient progress toward functional and medical goals. FIM:                                 Medical Problem List and Plan: 1. Functional deficits secondary to right frontal meningioma status post resection 05/08/2014 2. DVT Prophylaxis/Anticoagulation: Subcutaneous heparin initiated 05/22/2014. Monitor for any bleeding episodes 3. Pain Management: Hydrocodone as needed. Monitor with increased mobility 4. Mood/depression: Cymbalta 60 mg twice a day. Provide emotional support 5. Neuropsych: This patient is capable of making decisions on her own behalf. 6. Skin/Wound Care: Routine skin checks 7. Fluids/Electrolytes/Nutrition: encourage po, follow chemistries  -ask RD to follow up (saw pt yesterday)  -poor po intake likely multifactorial--consider megace trial 8. Seizure disorder. Keppra 500 mg 3 times a day, Vimpat 200 mg twice a day. Monitor for any seizure activity 9. Dysphagia. Dysphagia 3 nectar liquids. Follow speech therapy. Monitor hydration 10. Constipation with persistent nausea vomiting. Resolving. Continue Mira lax twice a day as  well as Reglan. Follow-up gastroneurology service as needed. 11. Hypertension. Clonidine patch 0.3 mg weekly, Lasix 40 mg twice a day. Markedly increased mobility 12 hypothyroidism. Synthroid 13. History of left breast cancer. Continue arimidex 14. Diabetes mellitus and peripheral neuropathy. Hemoglobin A1c 9.4. Sliding scale insulin. Patient on Liraglutide 18 mg subcutaneously daily prior to admission. 15. History of gout. Colchicine 0.6 mg twice  a day. Monitor for any gout flareups---hold for the moment 16. COPD. Continue Dulera 2 puffs twice daily. No shortness of breath  LOS (Days) 1 A FACE TO FACE EVALUATION WAS PERFORMED  SWARTZ,ZACHARY T 05/30/2014 9:21 AM

## 2014-05-30 NOTE — Evaluation (Addendum)
Physical Therapy Assessment and Plan  Patient Details  Name: Morgan [name] MRN: [id number] Date of Birth: Dec 12, 1940  PT Diagnosis: Difficulty walking, Impaired cognition, Impaired sensation and Muscle weakness Rehab Potential: Fair ELOS: 16 to 18 days   Today's Date: 05/30/2014 PT Individual Time: 0900-1005 PT Individual Time Calculation (min): 65 min    Problem List:  Patient Active Problem List   Diagnosis Date Noted  . DM neuropathy with neurologic complication   . Acute respiratory failure, unspecified whether with hypoxia or hypercapnia   . Hyponatremia   . Peripheral neuropathy   . Essential hypertension   . Gastroesophageal reflux disease without esophagitis   . Meningioma   . Acute urinary retention   . HLD (hyperlipidemia)   . Other emphysema   . Other specified hypothyroidism   . Primary gout   . Brain mass 05/03/2014  . Acute encephalopathy 05/03/2014  . Left-sided weakness 05/03/2014  . Aphasia 05/03/2014  . Abnormal head CT   . Left hemiparesis   . 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 Peripheral angiopathy due to secondary diabetes 01/04/2012  . Diabetes type 2, uncontrolled   . Hypoglycemia associated with diabetes   . Edema of both legs   . Combined hyperlipidemia   . Acquired autoimmune hypothyroidism   . Thyroiditis, autoimmune   . Abnormal liver function tests   . Sleep apnea, obstructive   . History of gastroesophageal reflux (GERD)   . Depression   . Type 2 diabetes mellitus with polyneuropathy   . COPD with acute exacerbation   . Goiter   . Fatigue   . Vertigo   . Type II diabetes mellitus with peripheral angiopathy   . Gout   . Pallor   . Type II or unspecified type diabetes mellitus without mention of complication, uncontrolled 08/01/2010  . Hypertension 08/01/2010  . Mixed hyperlipidemia 08/01/2010  . Obesity 08/01/2010    Past Medical History:  Past Medical History  Diagnosis Date  . Diabetes mellitus   . Obesity    . Diabetes type 2, uncontrolled   . Hypoglycemia associated with diabetes   . Edema of both legs   . Hypertension   . Combined hyperlipidemia   . Acquired autoimmune hypothyroidism   . Thyroiditis, autoimmune   . Abnormal liver function tests   . Sleep apnea, obstructive   . History of gastroesophageal reflux (GERD)   . Depression   . DM neuropathy with neurologic complication   . COPD with acute exacerbation   . Goiter   . Fatigue   . Vertigo   . Type II diabetes mellitus with peripheral angiopathy   . Gout   . Pallor    Past Surgical History:  Past Surgical History  Procedure Laterality Date  . Laparoscopic gastric banding    . Back surgery    . Craniotomy Right 05/08/2014    Procedure: CRANIOTOMY FOR MENINGIOMA;  Surgeon: Ashok Pall, MD;  Location: [place] [facility];  Service: Neurosurgery;  Laterality: Right;  Right Craniotomy for meningioma  . Esophagogastroduodenoscopy (egd) with propofol N/A 05/22/2014    Procedure: ESOPHAGOGASTRODUODENOSCOPY (EGD) WITH PROPOFOL;  Surgeon: Wonda Horner, MD;  Location: [facility] ENDOSCOPY;  Service: Endoscopy;  Laterality: N/A;    Assessment & Plan Clinical Impression: Patient is a 74 y.o. right handed female with history of hypertension, left breast cancer in remission 5 years, renal cancer with nephrectomy, diabetes mellitus with peripheral neuropathy and COPD. Patient lives with her husband and he uses a walker. She was independent  prior to admission. Admitted 05/03/2014 with altered mental status, seizure and left-sided weakness with difficulty in speech and gaze to the left. CT MRI and imaging showed evidence of enlarging poorly characterized mass at the high right frontal parietal region measuring 5.4 x 3.3 cm with underlying vasogenic edema suspect meningioma. EEG shows with slowing of cerebral activity diffusely more pronounced involving right hemisphere in addition to an epileptiform disturbance involving the right hemisphere. Patient was  loaded with Vimpat as well as Keppra. Neurosurgery consulted underwent right frontal stereotactic craniotomy for tumor resection 05/08/2014 per Dr. Cyndy Freeze. Patient did require intubation for a short time followed by critical care medicine and extubated without difficulty. Decadron protocol as directed and since completed. Patient developed bouts of nausea vomiting and extreme constipation. Gastroenterology services was consulted 05/18/2014. Abdominal film showed no signs of obstruction or perforation. EGD completed 05/22/2014 was unremarkable with no evidence of esophagitis or ulcers or strictures. She did need nasogastric tube for a short time for nutritional support her diet was slowly advanced and bowel program was regulated. She also received TNA for a short time. Presently maintained on a dysphagia 3 nectar thick liquid diet. Subcutaneous heparin was added for DVT prophylaxis 05/22/2014. Physical and occupational therapy evaluations completed 05/12/2014 with recommendations of physical medicine rehabilitation consult.  Patient transferred to CIR on 05/29/2014 .   Patient currently requires mod with mobility secondary to muscle weakness, decreased cardiorespiratoy endurance and decreased safety awareness.  Prior to hospitalization, patient was modified independent  with mobility and lived with Spouse in a House home.  Home access is  Ramped entrance.  Patient will benefit from skilled PT intervention to maximize safe functional mobility, minimize fall risk and decrease caregiver burden for planned discharge home with 24 hour supervision.  Anticipate patient will benefit from follow up [place] at discharge.  PT - End of Session Activity Tolerance: Tolerates 30+ min activity with multiple rests Endurance Deficit: Yes PT Assessment Rehab Potential (ACUTE/IP ONLY): Fair Barriers to Discharge: Decreased caregiver support PT Patient demonstrates impairments in the following area(s):  Balance;Safety;Endurance;Motor PT Transfers Functional Problem(s): Bed Mobility;Bed to Chair;Car PT Locomotion Functional Problem(s): Ambulation;Wheelchair Mobility;Stairs PT Plan PT Intensity: Minimum of 1-2 x/day ,45 to 90 minutes PT Frequency: 5 out of 7 days PT Duration Estimated Length of Stay: 16 to 18 days PT Treatment/Interventions: Ambulation/gait training;Balance/vestibular training;Disease management/prevention;Discharge planning;DME/adaptive equipment instruction;Functional electrical stimulation;Functional mobility training;Patient/family education;Neuromuscular re-education;Psychosocial support;Splinting/orthotics;Therapeutic Exercise;Therapeutic Activities;Stair training;UE/LE Strength taining/ROM;Wheelchair propulsion/positioning;UE/LE Coordination activities PT Transfers Anticipated Outcome(s): S for transfers PT Locomotion Anticipated Outcome(s): S for ambulation, I w/c mobility, min guard for stairs PT Recommendation Follow Up Recommendations: Home health PT Patient destination: Home Equipment Recommended: To be determined  Skilled Therapeutic Intervention PT evaluation completed and treatment plan initiated. Pt performed multiple transfers sit to stand with mod A and verbal cues. Pt performed edge of bed to commode and commode to edge of bed with mod A and verbal cues. Discussed POC with patient and son.   PT Evaluation Precautions/Restrictions Precautions Precautions: Fall Restrictions Weight Bearing Restrictions: No General Chart Reviewed: Yes Family/Caregiver Present: Yes  Pain No c/o pain.  Home Living/Prior Functioning Home Living Available Help at Discharge: Family;Available 24 hours/day Type of Home: House Home Access: Ramped entrance Home Layout: One level Additional Comments: pt's spouse was injured in Oct/Nov 2015, has returned home, currently ambulatory with rolling walker, receiving therapy and home health services, unable to physically assist at  discharge  Lives With: Spouse Prior Function Level of Independence: Independent with gait;Independent with  transfers;Requires assistive device for independence (hx of falls)  Able to Take Stairs?: Yes Comments: utilized st cane off/on in house and around house, power scooter for long distances Vision/Perception As per OT evaluation.  Cognition Overall Cognitive Status: Impaired/Different from baseline Orientation Level: Oriented X4 Memory: Impaired Memory Impairment: Decreased recall of new information Behaviors: Lability Safety/Judgment: Impaired Comments: flat affect, prefers to keep eyes closed, requiring verbal cues to engage during evaluation.  Sensation Sensation Light Touch: Impaired by gross assessment Additional Comments: B LEs distal to knee as per pt hx of neuropathy Motor  Motor Motor: Within Functional Limits  Mobility Bed Mobility Bed Mobility: Supine to Sit;Sit to Supine Supine to Sit: 4: Min assist Sit to Supine: 4: Min assist Transfers Transfers: Yes Sit to Stand: 3: Mod assist Stand to Sit: 3: Mod assist Stand Pivot Transfers: 3: Mod assist Locomotion  Ambulation Ambulation: Yes Ambulation/Gait Assistance: 3: Mod assist Ambulation Distance (Feet): 5 Feet Assistive device: 1 person hand held assist Gait Gait: Yes Gait Pattern: Impaired Gait Pattern: Narrow base of support;Decreased step length - right;Decreased step length - left Gait velocity: decreased velocity Stairs / Additional Locomotion Stairs: Yes Stairs Assistance: 3: Mod assist Stair Management Technique: Two rails Number of Stairs: 1 Wheelchair Mobility Wheelchair Mobility: Yes Wheelchair Assistance: 4: Min Lexicographer: Both lower extermities Distance: 30  Trunk/Postural Assessment  Cervical Assessment Cervical Assessment: Within Scientist, physiological Assessment: Within Functional Limits Lumbar Assessment Lumbar Assessment: Within  Functional Limits Postural Control Postural Control: Deficits on evaluation  Balance Balance Balance Assessed: Yes Static Sitting Balance Static Sitting - Balance Support: Feet supported Static Sitting - Level of Assistance: 5: Stand by assistance Static Standing Balance Static Standing - Balance Support: During functional activity Static Standing - Level of Assistance: 3: Mod assist Dynamic Standing Balance Dynamic Standing - Balance Support: During functional activity Dynamic Standing - Level of Assistance: 3: Mod assist Extremity Assessment B UEs as per OT evaluation.    RLE Assessment RLE Assessment: Exceptions to [facility] RLE AROM (degrees) Overall AROM Right Lower Extremity: Within functional limits for tasks assessed RLE Strength RLE Overall Strength: Deficits RLE Overall Strength Comments: grossly 3/5 throughout LLE Assessment LLE Assessment: Exceptions to WFL LLE AROM (degrees) Overall AROM Left Lower Extremity: Within functional limits for tasks assessed LLE Strength LLE Overall Strength: Deficits LLE Overall Strength Comments: grossly 3/5 throughout   FIM:  FIM - Control and instrumentation engineer Devices: Arm rests Bed/Chair Transfer: 4: Supine > Sit: Min A (steadying Pt. > 75%/lift 1 leg);3: Bed > Chair or W/C: Mod A (lift or lower assist) FIM - Locomotion: Wheelchair Distance: 30 Locomotion: Wheelchair: 1: Travels less than 50 ft with minimal assistance (Pt.>75%) FIM - Locomotion: Ambulation Locomotion: Ambulation Assistive Devices: Other (comment) (without assistive device) Ambulation/Gait Assistance: 3: Mod assist Locomotion: Ambulation: 1: Travels less than 50 ft with moderate assistance (Pt: 50 - 74%) FIM - Locomotion: Stairs Locomotion: Scientist, physiological: Hand rail - 2 Locomotion: Stairs: 1: Up and Down < 4 stairs with moderate assistance (Pt: 50 - 74%)   Refer to Care Plan for Long Term Goals  Recommendations for other services:  None  Discharge Criteria: Patient will be discharged from PT if patient refuses treatment 3 consecutive times without medical reason, if treatment goals not met, if there is a change in medical status, if patient makes no progress towards goals or if patient is discharged from hospital.  The above assessment, treatment plan, treatment alternatives and goals were  discussed and mutually agreed upon: by patient and by family  Dub Amis 05/30/2014, 12:42 PM

## 2014-05-30 NOTE — Progress Notes (Signed)
05/30/14 1820 nursing RN offered to do water protocol 2x patient refuses; patient did not eat much today but likes v8 juice.

## 2014-05-30 NOTE — Evaluation (Signed)
Speech Language Pathology Assessment and Plan  Patient Details  Name: Morgan [name] MRN: [id number] Date of Birth: 1940/10/27  SLP Diagnosis: Dysphagia;Cognitive Impairments  Rehab Potential: Good ELOS: 2.5 weeks     Today's Date: 05/30/2014 SLP Individual Time: 7619-5093 SLP Individual Time Calculation (min): 75 min   Problem List:  Patient Active Problem List   Diagnosis Date Noted  . DM neuropathy with neurologic complication   . Acute respiratory failure, unspecified whether with hypoxia or hypercapnia   . Hyponatremia   . Peripheral neuropathy   . Essential hypertension   . Gastroesophageal reflux disease without esophagitis   . Meningioma   . Acute urinary retention   . HLD (hyperlipidemia)   . Other emphysema   . Other specified hypothyroidism   . Primary gout   . Brain mass 05/03/2014  . Acute encephalopathy 05/03/2014  . Left-sided weakness 05/03/2014  . Aphasia 05/03/2014  . Abnormal head CT   . Left hemiparesis   . 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 Peripheral angiopathy due to secondary diabetes 01/04/2012  . Diabetes type 2, uncontrolled   . Hypoglycemia associated with diabetes   . Edema of both legs   . Combined hyperlipidemia   . Acquired autoimmune hypothyroidism   . Thyroiditis, autoimmune   . Abnormal liver function tests   . Sleep apnea, obstructive   . History of gastroesophageal reflux (GERD)   . Depression   . Type 2 diabetes mellitus with polyneuropathy   . COPD with acute exacerbation   . Goiter   . Fatigue   . Vertigo   . Type II diabetes mellitus with peripheral angiopathy   . Gout   . Pallor   . Type II or unspecified type diabetes mellitus without mention of complication, uncontrolled 08/01/2010  . Hypertension 08/01/2010  . Mixed hyperlipidemia 08/01/2010  . Obesity 08/01/2010   Past Medical History:  Past Medical History  Diagnosis Date  . Diabetes mellitus   . Obesity   . Diabetes type 2, uncontrolled    . Hypoglycemia associated with diabetes   . Edema of both legs   . Hypertension   . Combined hyperlipidemia   . Acquired autoimmune hypothyroidism   . Thyroiditis, autoimmune   . Abnormal liver function tests   . Sleep apnea, obstructive   . History of gastroesophageal reflux (GERD)   . Depression   . DM neuropathy with neurologic complication   . COPD with acute exacerbation   . Goiter   . Fatigue   . Vertigo   . Type II diabetes mellitus with peripheral angiopathy   . Gout   . Pallor    Past Surgical History:  Past Surgical History  Procedure Laterality Date  . Laparoscopic gastric banding    . Back surgery    . Craniotomy Right 05/08/2014    Procedure: CRANIOTOMY FOR MENINGIOMA;  Surgeon: Ashok Pall, MD;  Location: [place] [facility];  Service: Neurosurgery;  Laterality: Right;  Right Craniotomy for meningioma  . Esophagogastroduodenoscopy (egd) with propofol N/A 05/22/2014    Procedure: ESOPHAGOGASTRODUODENOSCOPY (EGD) WITH PROPOFOL;  Surgeon: Wonda Horner, MD;  Location: [facility] ENDOSCOPY;  Service: Endoscopy;  Laterality: N/A;    Assessment / Plan / Recommendation Clinical Impression Patient is a 74 y.o. right handed female with history of hypertension, left breast cancer in remission 5 years, renal cancer with nephrectomy, diabetes mellitus with peripheral neuropathy and COPD. Patient lives with her husband and he uses a walker. She was independent prior to admission. Admitted 05/03/2014  with altered mental status, seizure and left-sided weakness with difficulty in speech and gaze to the left. CT MRI and imaging showed evidence of enlarging poorly characterized mass at the high right frontal parietal region measuring 5.4 x 3.3 cm with underlying vasogenic edema suspect meningioma. EEG shows with slowing of cerebral activity diffusely more pronounced involving right hemisphere in addition to an epileptiform disturbance involving the right hemisphere. Neurosurgery consulted underwent  right frontal stereotactic craniotomy for tumor resection 05/08/2014 per Dr. Cyndy Freeze. Patient did require intubation for a short time followed by critical care medicine and extubated without difficulty. Patient developed bouts of nausea and vomiting and extreme constipation. Gastroenterology services was consulted 05/18/2014. Abdominal film showed no signs of obstruction or perforation. EGD completed 05/22/2014 was unremarkable with no evidence of esophagitis or ulcers or strictures. She did need nasogastric tube for a short time for nutritional support her diet was slowly advanced and bowel program was regulated. She also received TNA for a short time. Presently maintained on dysphagia 3 textures with nectar thick liquids. Physical and occupational therapy evaluations completed 05/12/2014 with recommendations of physical medicine rehabilitation consult and patient transferred to CIR on 05/29/2014. Patient administered a BSE and demonstrated multiple swallows with delayed cough X 2 with trials of thin liquids via cup. Multiple swallows were eliminated with sips via teaspoon. Patient also demonstrated prolonged mastication with a "munch chew" verses a rotary chew which the patient's family reports is baseline with intermittent cough X 3. Patient's family is currently unhappy with patient's current diet and reports that her overt s/s of aspiration are baseline and due to reflux. Patient's son re-educated on results of MBS, aspiration risks, etc, however, patient's son continues to verbalize his current frustration with her diet. Recommend patient continue current diet of Dys. 3 textures with nectar-thick liquids with full supervision and initiate the water protocol. Also recommend repeat MBS to assess dysphagia and possible diet upgrade. Patient also administered a cognitive-linguistic evaluation and demonstrates decreased sustained attention and maintained her eyes being close for majority of session, decreased recall of  new information, decreased emergent awareness and decreased functional problem solving. Patient also demonstrates a low vocal intensity. Patient would benefit from skilled SLP intervention to maximize her swallowing and cognitive function and overall functional independence prior to discharge. Anticipate patient will require 24 hour supervision and f/u SLP services.    Skilled Therapeutic Interventions          Administered a BSE and cognitive-linguistic evaluation, please see above for details. Educated patient and her son in regards to her current swallowing function, aspiration risks, current diet, appropriate textures, swallowing compensatory strategies and goals of skilled SLP intervention. They verbalized understanding but require reinforcement.   SLP Assessment  Patient will need skilled Speech Lanaguage Pathology Services during CIR admission    Recommendations  Recommended Consults: MBS Diet Recommendations: Nectar-thick liquid;Dysphagia 3 (Mechanical Soft);Free water protocol after oral care Liquid Administration via: Straw;Cup Medication Administration: Whole meds with puree Supervision: Patient able to self feed;Full supervision/cueing for compensatory strategies Compensations: Slow rate;Small sips/bites;Follow solids with liquid;Multiple dry swallows after each bite/sip Postural Changes and/or Swallow Maneuvers: Upright 30-60 min after meal;Seated upright 90 degrees Oral Care Recommendations: Oral care BID Recommendations for Other Services: Neuropsych consult Patient destination: Home Follow up Recommendations: Outpatient SLP;Home Health SLP;24 hour supervision/assistance Equipment Recommended: To be determined    SLP Frequency 3 to 5 out of 7 days   SLP Treatment/Interventions Cognitive remediation/compensation;Cueing hierarchy;Dysphagia/aspiration precaution training;Environmental controls;Functional tasks;Internal/external aids;Patient/family education;Therapeutic Activities  Pain No/Denies Pain   Prior Functioning Type of Home: House  Lives With: Spouse Available Help at Discharge: Family;Available 24 hours/day  Short Term Goals: Week 1: SLP Short Term Goal 1 (Week 1): Patient will consume current diet with minimal overt s/s of aspiration with Min Averbal cues for use of swallowing strategies  SLP Short Term Goal 2 (Week 1): Patient will consume trials of thin liquids with minimal overt s/s of aspiration with Min A multimodal cues needed for use of small sips.  SLP Short Term Goal 3 (Week 1): Patient will keep her eyes open during functional tasks for 5 minutes with Max A multimodal cues.  SLP Short Term Goal 4 (Week 1): Patient will recall new, daily information with Min A multimodal cues for use of external memory aids SLP Short Term Goal 5 (Week 1): Patient will utilize an increased vocal intensity at the phrase level with supervision verbal cues.   See FIM for current functional status Refer to Care Plan for Long Term Goals  Recommendations for other services: Neuropsych  Discharge Criteria: Patient will be discharged from SLP if patient refuses treatment 3 consecutive times without medical reason, if treatment goals not met, if there is a change in medical status, if patient makes no progress towards goals or if patient is discharged from hospital.  The above assessment, treatment plan, treatment alternatives and goals were discussed and mutually agreed upon: by patient and by family  Anastasha Ortez 05/30/2014, 3:43 PM

## 2014-05-30 NOTE — Evaluation (Signed)
Occupational Therapy Assessment and Plan  Patient Details  Name: Morgan [name] MRN: [id number] Date of Birth: 06-Jul-1940  OT Diagnosis: abnormal posture, altered mental status, cognitive deficits and muscle weakness (generalized) Rehab Potential: Rehab Potential (ACUTE ONLY): Excellent ELOS: 14-17 days   Today's Date: 05/30/2014 OT Individual Time: 2706-2376 OT Individual Time Calculation (min): 75 min     Problem List:  Patient Active Problem List   Diagnosis Date Noted  . DM neuropathy with neurologic complication   . Acute respiratory failure, unspecified whether with hypoxia or hypercapnia   . Hyponatremia   . Peripheral neuropathy   . Essential hypertension   . Gastroesophageal reflux disease without esophagitis   . Meningioma   . Acute urinary retention   . HLD (hyperlipidemia)   . Other emphysema   . Other specified hypothyroidism   . Primary gout   . Brain mass 05/03/2014  . Acute encephalopathy 05/03/2014  . Left-sided weakness 05/03/2014  . Aphasia 05/03/2014  . Abnormal head CT   . Left hemiparesis   . 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 Peripheral angiopathy due to secondary diabetes 01/04/2012  . Diabetes type 2, uncontrolled   . Hypoglycemia associated with diabetes   . Edema of both legs   . Combined hyperlipidemia   . Acquired autoimmune hypothyroidism   . Thyroiditis, autoimmune   . Abnormal liver function tests   . Sleep apnea, obstructive   . History of gastroesophageal reflux (GERD)   . Depression   . Type 2 diabetes mellitus with polyneuropathy   . COPD with acute exacerbation   . Goiter   . Fatigue   . Vertigo   . Type II diabetes mellitus with peripheral angiopathy   . Gout   . Pallor   . Type II or unspecified type diabetes mellitus without mention of complication, uncontrolled 08/01/2010  . Hypertension 08/01/2010  . Mixed hyperlipidemia 08/01/2010  . Obesity 08/01/2010    Past Medical History:  Past Medical History   Diagnosis Date  . Diabetes mellitus   . Obesity   . Diabetes type 2, uncontrolled   . Hypoglycemia associated with diabetes   . Edema of both legs   . Hypertension   . Combined hyperlipidemia   . Acquired autoimmune hypothyroidism   . Thyroiditis, autoimmune   . Abnormal liver function tests   . Sleep apnea, obstructive   . History of gastroesophageal reflux (GERD)   . Depression   . DM neuropathy with neurologic complication   . COPD with acute exacerbation   . Goiter   . Fatigue   . Vertigo   . Type II diabetes mellitus with peripheral angiopathy   . Gout   . Pallor    Past Surgical History:  Past Surgical History  Procedure Laterality Date  . Laparoscopic gastric banding    . Back surgery    . Craniotomy Right 05/08/2014    Procedure: CRANIOTOMY FOR MENINGIOMA;  Surgeon: Ashok Pall, MD;  Location: [place] [facility];  Service: Neurosurgery;  Laterality: Right;  Right Craniotomy for meningioma  . Esophagogastroduodenoscopy (egd) with propofol N/A 05/22/2014    Procedure: ESOPHAGOGASTRODUODENOSCOPY (EGD) WITH PROPOFOL;  Surgeon: Wonda Horner, MD;  Location: [facility] ENDOSCOPY;  Service: Endoscopy;  Laterality: N/A;    Assessment & Plan Clinical Impression: Patient is a 74 y.o. year old female with recent admission to the hospital on 05/03/2014 with altered mental status, seizure and left-sided weakness with difficulty in speech and gaze to the left. CT MRI and imaging showed  evidence of enlarging poorly characterized mass at the high right frontal parietal region measuring 5.4 x 3.3 cm with underlying vasogenic edema suspect meningioma. EEG shows with slowing of cerebral activity diffusely more pronounced involving right hemisphere in addition to an epileptiform disturbance involving the right hemisphere. Patient was loaded with Vimpat as well as Keppra. Neurosurgery consulted underwent right frontal stereotactic craniotomy for tumor resection 05/08/2014 per Dr. Cyndy Freeze  Patient transferred  to [place] on 05/29/2014 .    Patient currently requires mod with basic self-care skills secondary to muscle weakness, impaired timing and sequencing and unbalanced muscle activation, decreased initiation, decreased attention, decreased awareness, decreased problem solving, decreased safety awareness, decreased memory and delayed processing and decreased standing balance, decreased postural control and decreased balance strategies.  Prior to hospitalization, patient could complete ADls with independent .  Patient will benefit from skilled intervention to decrease level of assist with basic self-care skills and increase independence with basic self-care skills prior to discharge home with care partner and family.  Anticipate patient will require 24 hour supervision and follow up home health.  OT - End of Session Activity Tolerance: Decreased this session Endurance Deficit: Yes OT Assessment Rehab Potential (ACUTE ONLY): Excellent OT Patient demonstrates impairments in the following area(s): Balance;Cognition;Endurance;Motor;Safety OT Basic ADL's Functional Problem(s): Grooming;Eating;Bathing;Dressing;Toileting OT Advanced ADL's Functional Problem(s): Simple Meal Preparation OT Transfers Functional Problem(s): Toilet;Tub/Shower OT Additional Impairment(s): None OT Plan OT Intensity: Minimum of 1-2 x/day, 45 to 90 minutes OT Frequency: 5 out of 7 days OT Duration/Estimated Length of Stay: 14-17 days OT Treatment/Interventions: Balance/vestibular training;Cognitive remediation/compensation;DME/adaptive equipment instruction;Neuromuscular re-education;Self Care/advanced ADL retraining;Therapeutic Exercise;UE/LE Strength taining/ROM;Patient/family education;UE/LE Coordination activities;Therapeutic Activities;Discharge planning;Functional mobility training OT Self Feeding Anticipated Outcome(s): modified independent OT Basic Self-Care Anticipated Outcome(s): supervision OT Toileting Anticipated  Outcome(s): supervision OT Bathroom Transfers Anticipated Outcome(s): supervision OT Recommendation Patient destination: Home Follow Up Recommendations: Home health OT Equipment Recommended: To be determined   Skilled Therapeutic Intervention Pt began working on selfcare re-training during session.  Mod assist for transfer to the tub bench using the RW.  Mod demonstrational cueing for hand placement with sit to stand.  She was able to complete bathing with overall mod demonstrational cueing and mod assist as well as dressing.  She kept her eyes close for over 75% of session and needed max demonstrational cueing to open them.  Mod assist for multiple sit to stand transitions from tub seat, 3:1 (where she sat to get dressed) and elevated toilet with rail.  Pt's son present for session and reports frustration with her having to drink thickened liquids.  Pt positioned in bedside chair at end of session and discussed the need for pt to have safety belt in place if family is not present with pt.  He voiced understanding but safety belt not placed at this time as son is staying in the room with her.   OT Evaluation Precautions/Restrictions  Precautions Precautions: Fall Precaution Comments: decreased sustained attention Restrictions Weight Bearing Restrictions: No  Pain Pain Assessment Pain Assessment: No/denies pain Home Living/Prior Functioning Home Living Available Help at Discharge: Family, Available 24 hours/day Type of Home: House Home Access: Ramped entrance Home Layout: One level Additional Comments: pt's spouse was injured in Oct/Nov 2015, has returned home, currently ambulatory with rolling walker, receiving therapy and home health services, unable to physically assist at discharge  Lives With: Spouse Prior Function Level of Independence: Independent with gait, Independent with transfers, Requires assistive device for independence  Able to Take Stairs?: Yes Leisure: Hobbies-yes  (  Comment) Comments: spending time with her 3 grandchildren ADL  See FIM scale for details  Vision/Perception  Vision- History Baseline Vision/History: Wears glasses Wears Glasses: At all times Patient Visual Report: No change from baseline Vision- Assessment Vision Assessment?: No apparent visual deficits Additional Comments: Will need further assessment when pt can keep her eyes open for longer periods of time.  Pt currently with eyes closed for 75% of OT eval.  Cognition Overall Cognitive Status: Impaired/Different from baseline Orientation Level: Oriented X4 Attention: Focused;Sustained Focused Attention: Appears intact Sustained Attention: Impaired Memory: Impaired Memory Impairment: Decreased recall of new information Behaviors: Lability Safety/Judgment: Impaired Comments: flat affect, prefers to keep eyes closed, requiring verbal cues to engage during evaluation.  Sensation Sensation Light Touch: Not tested Stereognosis: Not tested Hot/Cold: Not tested Proprioception: Not tested Additional Comments: B LEs distal to knee as per pt hx of neuropathy Coordination Gross Motor Movements are Fluid and Coordinated: Yes Fine Motor Movements are Fluid and Coordinated: Yes (Will continue to further assess in function but pt able to use both UEs appropirately during bathing tasks and for pulling pants up over hips with dressing. ) Motor  Motor Motor: Abnormal postural alignment and control Motor - Skilled Clinical Observations: Pt with flexed trunk in standing. Mobility  Bed Mobility Bed Mobility: Supine to Sit;Sit to Supine Supine to Sit: 4: Min assist Sit to Supine: 4: Min assist Transfers Sit to Stand: 3: Mod assist Stand to Sit: 3: Mod assist  Trunk/Postural Assessment  Cervical Assessment Cervical Assessment: Within Functional Limits Thoracic Assessment Thoracic Assessment: Within Functional Limits Lumbar Assessment Lumbar Assessment: Within Functional  Limits Postural Control Postural Control: Deficits on evaluation  Balance Balance Balance Assessed: Yes Static Sitting Balance Static Sitting - Balance Support: Feet supported Static Sitting - Level of Assistance: 5: Stand by assistance Static Standing Balance Static Standing - Balance Support: During functional activity Static Standing - Level of Assistance: 3: Mod assist Dynamic Standing Balance Dynamic Standing - Balance Support: During functional activity Dynamic Standing - Level of Assistance: 3: Mod assist Extremity/Trunk Assessment RUE Assessment RUE Assessment: Not tested (Not formally assessed but AROM WFLs for functional ADL tasks.) LUE Assessment LUE Assessment: Not tested (Not formally assessed but AROM WFLs for functional ADL tasks)  FIM:  FIM - Grooming Grooming Steps: Wash, rinse, dry face;Wash, rinse, dry hands Grooming: 3: Patient completes 2 of 4 or 3 of 5 steps FIM - Bathing Bathing Steps Patient Completed: Chest;Right Arm;Left Arm;Abdomen;Right upper leg;Left upper leg Bathing: 3: Mod-Patient completes 5-7 38f10 parts or 50-74% FIM - Upper Body Dressing/Undressing Upper body dressing/undressing steps patient completed: Thread/unthread right sleeve of pullover shirt/dresss;Thread/unthread left sleeve of pullover shirt/dress;Put head through opening of pull over shirt/dress Upper body dressing/undressing: 4: Min-Patient completed 75 plus % of tasks FIM - Lower Body Dressing/Undressing Lower body dressing/undressing steps patient completed: Pull underwear up/down;Thread/unthread left pants leg Lower body dressing/undressing: 2: Max-Patient completed 25-49% of tasks FIM - Toileting Toileting: 1: Total-Patient completed zero steps, helper did all 3 FIM - Bed/Chair Transfer Bed/Chair Transfer Assistive Devices: Arm rests Bed/Chair Transfer: 4: Supine > Sit: Min A (steadying Pt. > 75%/lift 1 leg);3: Bed > Chair or W/C: Mod A (lift or lower assist) FIM - Toilet  Transfers Toilet Transfers: 3-From toilet/BSC: Mod A (lift or lower assist);3-To toilet/BSC: Mod A (lift or lower assist) FIM - Tub/Shower Transfers Tub/Shower Assistive Devices: Tub transfer bench;Grab bars;Walk in shower Tub/shower Transfers: 3-Out of Tub/Shower: Mod A (lift or lower/lift 2 legs);3-Into Tub/Shower: Mod A (  lift or lower/lift 2 legs)   Refer to Care Plan for Long Term Goals  Recommendations for other services: None  Discharge Criteria: Patient will be discharged from OT if patient refuses treatment 3 consecutive times without medical reason, if treatment goals not met, if there is a change in medical status, if patient makes no progress towards goals or if patient is discharged from hospital.  The above assessment, treatment plan, treatment alternatives and goals were discussed and mutually agreed upon: by patient and by family  Morgan [name] OTR/L 05/30/2014, 4:20 PM

## 2014-05-31 ENCOUNTER — Inpatient Hospital Stay (HOSPITAL_COMMUNITY): Payer: Medicare Other

## 2014-05-31 ENCOUNTER — Inpatient Hospital Stay (HOSPITAL_COMMUNITY): Payer: Medicare Other | Admitting: Physical Therapy

## 2014-05-31 LAB — GLUCOSE, CAPILLARY
GLUCOSE-CAPILLARY: 125 mg/dL — AB (ref 70–99)
GLUCOSE-CAPILLARY: 117 mg/dL — AB (ref 70–99)
GLUCOSE-CAPILLARY: 254 mg/dL — AB (ref 70–99)
Glucose-Capillary: 192 mg/dL — ABNORMAL HIGH (ref 70–99)
Glucose-Capillary: 313 mg/dL — ABNORMAL HIGH (ref 70–99)

## 2014-05-31 MED ORDER — NYSTATIN 100000 UNIT/ML MT SUSP
5.0000 mL | Freq: Four times a day (QID) | OROMUCOSAL | Status: AC
  Administered 2014-05-31 – 2014-06-02 (×10): 500000 [IU] via ORAL
  Filled 2014-05-31 (×12): qty 5

## 2014-05-31 MED ORDER — FLEET ENEMA 7-19 GM/118ML RE ENEM: 1.0000 | ENEMA | Freq: Every day | RECTAL | Status: DC | PRN

## 2014-05-31 MED ORDER — FLUCONAZOLE 100 MG PO TABS
100.0000 mg | ORAL_TABLET | Freq: Every day | ORAL | Status: AC
  Administered 2014-05-31 – 2014-06-02 (×3): 100 mg via ORAL
  Filled 2014-05-31 (×3): qty 1

## 2014-05-31 MED ORDER — MEGESTROL ACETATE 400 MG/10ML PO SUSP
400.0000 mg | Freq: Two times a day (BID) | ORAL | Status: DC
  Administered 2014-05-31 – 2014-06-06 (×13): 400 mg via ORAL
  Filled 2014-05-31 (×19): qty 10

## 2014-05-31 MED ORDER — LACOSAMIDE 50 MG PO TABS
200.0000 mg | ORAL_TABLET | Freq: Two times a day (BID) | ORAL | Status: DC
Start: 2014-05-31 — End: 2014-06-11
  Administered 2014-05-31 – 2014-06-11 (×21): 200 mg via ORAL
  Filled 2014-05-31 (×22): qty 4

## 2014-05-31 NOTE — Progress Notes (Signed)
KUB with stool throughout colon---will go ahead and try a soap sud enema to see if we can empty her out and improve nausea,appetite along with it.   Meredith Staggers, MD, [facility] 05/31/2014

## 2014-05-31 NOTE — Progress Notes (Signed)
Physical Therapy Session Note  Patient Details  Name: Morgan [name] MRN: [id number] Date of Birth: 1941-03-20  Today's Date: 05/31/2014 PT Individual Time: 0800-0900 PT Individual Time Calculation (min): 60 min   Short Term Goals: Week 1:  PT Short Term Goal 1 (Week 1): Pt will increase transfers supine to edge of bed. edge of bed to supine with S.  PT Short Term Goal 2 (Week 1): Pt will increase transfers bed to chair, chair to bed with LRAD to min A.  PT Short Term Goal 3 (Week 1): Pt will increase ambulation with LRAD about 50 feet with min A.  PT Short Term Goal 4 (Week 1): Pt will propel w/c with B LEs about 100 feet with S.  PT Short Term Goal 5 (Week 1): Pt will ascend/descend 4 stairs with B rails and min A.   Skilled Therapeutic Interventions/Progress Updates:   Session focused on activity tolerance, functional mobility, and participation. Patient received semi reclined in bed, initially refusing therapy due to nausea, daughter at bedside and present/engaged in session. RN present with medication for nausea. With increased time and max coaxing, patient eventually agreeable to OOB activity. Patient required max faded to min verbal cues to maintain eyes open throughout session. With HOB flat, patient transferred supine > sit using bed rail with min A. Therapist provided patient with wheelchair cushion for improved positioning in wheelchair. Pt performed sit <> stand using RW x 2 from edge of bed with mod>min A and initiated gait without prompting from therapist x 10 ft with min A. Pt propelled wheelchair using BUE x 75 ft with min A towards day room, cues for efficiency of technique with bigger pushes. From wheelchair level, patient motivated to work on jigsaw puzzle but with increased difficulty due to decreased Schaller and tremors RUE > LUE. Gait training using RW x 15 ft with min A overall. Pt left sitting in wheelchair with daughter present.   Therapy Documentation Precautions:   Precautions Precautions: Fall Precaution Comments: decreased sustained attention Restrictions Weight Bearing Restrictions: No Pain: Pain Assessment Pain Assessment: No/denies pain Locomotion : Ambulation Ambulation/Gait Assistance: 4: Min assist Wheelchair Mobility Distance: 75   See FIM for current functional status  Therapy/Group: Individual Therapy  Laretta Alstrom 05/31/2014, 9:01 AM

## 2014-05-31 NOTE — Progress Notes (Signed)
[facility]     PROGRESS NOTE    Subjective/Complaints: Still with nausea, decreased appetite, doesn't like the food, has some "thrush"  Objective: Vital Signs: Blood pressure 120/72, pulse 112, temperature 98 F (36.7 C), temperature source Oral, resp. rate 18, SpO2 96 %. No results found.  Recent Labs  05/29/14 1940  WBC 5.0  HGB 10.2*  HCT 31.1*  PLT 151    Recent Labs  05/29/14 0746 05/29/14 1940  NA 133*  --   K 3.9  --   CL 97  --   GLUCOSE 167*  --   BUN 14  --   CREATININE 0.99 1.06  CALCIUM 8.3*  --    CBG (last 3)   Recent Labs  05/30/14 2353 05/31/14 0415 05/31/14 0718  GLUCAP 129* 125* 117*    Wt Readings from Last 3 Encounters:  05/19/14 100.3 kg (221 lb 1.9 oz)  01/04/12 109.09 kg (240 lb 8 oz)  09/11/11 110.496 kg (243 lb 9.6 oz)    Physical Exam:  No distressed. Sitting with eyes closed. Opens them on command  HENT: mild thrush on sides of tongue Craniotomy site well-healed. No edema  Eyes: Conjunctivae and EOM are normal. Pupils are equal, round, and reactive to light.  Pupils round and reactive to light without nystagmus  Neck: Normal range of motion. Neck supple. No JVD present. No tracheal deviation present. No thyromegaly present.  Cardiovascular: Normal rate and regular rhythm.  Respiratory: Effort normal and breath sounds normal. No respiratory distress. She has no wheezes. She has no rales.  GI: Soft. Bowel sounds are normal. She exhibits no distension. There is no tenderness. There is no rebound.  Musculoskeletal:  She moves all extremities  Lymphadenopathy:   She has no cervical adenopathy.  Neurological:  Mood is flat but appropriate.  Oriented to place, reason she's at hospital. f She was able to provide her name, age. UES: grossly 3-4/5 proximal to distal UE's. HF 3/5, KE 4-, ADF/APF 4+. Senses pain in all 4's. Decreased [place]  Psychiatric: She has a normal mood and affect. Her behavior  is normal.   Assessment/Plan: 1. Functional deficits secondary to right meningioma s/p resection which require 3+ hours per day of interdisciplinary therapy in a comprehensive inpatient rehab setting. Physiatrist is providing close team supervision and 24 hour management of active medical problems listed below. Physiatrist and rehab team continue to assess barriers to discharge/monitor patient progress toward functional and medical goals. FIM: FIM - Bathing Bathing Steps Patient Completed: Chest, Right Arm, Left Arm, Abdomen, Right upper leg, Left upper leg Bathing: 3: Mod-Patient completes 5-7 49f10 parts or 50-74%  FIM - Upper Body Dressing/Undressing Upper body dressing/undressing steps patient completed: Thread/unthread right sleeve of pullover shirt/dresss, Thread/unthread left sleeve of pullover shirt/dress, Put head through opening of pull over shirt/dress Upper body dressing/undressing: 4: Min-Patient completed 75 plus % of tasks FIM - Lower Body Dressing/Undressing Lower body dressing/undressing steps patient completed: Pull underwear up/down, Thread/unthread left pants leg Lower body dressing/undressing: 2: Max-Patient completed 25-49% of tasks  FIM - Toileting Toileting: 1: Total-Patient completed zero steps, helper did all 3  FIM - Toilet Transfers Toilet Transfers: 3-From toilet/BSC: Mod A (lift or lower assist), 3-To toilet/BSC: Mod A (lift or lower assist)  FIM - BControl and instrumentation engineerDevices: Arm rests, Walker, Bed rails Bed/Chair Transfer: 4: Supine > Sit: Min A (steadying Pt. > 75%/lift 1 leg), 3: Bed > Chair or W/C: Mod A (  lift or lower assist)  FIM - Locomotion: Wheelchair Distance: 75 Locomotion: Wheelchair: 2: Travels 50 - 149 ft with minimal assistance (Pt.>75%) FIM - Locomotion: Ambulation Locomotion: Ambulation Assistive Devices: Administrator Ambulation/Gait Assistance: 4: Min assist Locomotion: Ambulation: 1: Travels less than  50 ft with minimal assistance (Pt.>75%)  Comprehension Comprehension Mode: Auditory Comprehension: 4-Understands basic 75 - 89% of the time/requires cueing 10 - 24% of the time  Expression Expression Mode: Verbal Expression: 3-Expresses basic 50 - 74% of the time/requires cueing 25 - 50% of the time. Needs to repeat parts of sentences.  Social Interaction Social Interaction: 4-Interacts appropriately 75 - 89% of the time - Needs redirection for appropriate language or to initiate interaction.  Problem Solving Problem Solving: 3-Solves basic 50 - 74% of the time/requires cueing 25 - 49% of the time  Memory Memory: 3-Recognizes or recalls 50 - 74% of the time/requires cueing 25 - 49% of the time Medical Problem List and Plan: 1. Functional deficits secondary to right frontal meningioma status post resection 05/08/2014 2. DVT Prophylaxis/Anticoagulation: Subcutaneous heparin initiated 05/22/2014. Monitor for any bleeding episodes 3. Pain Management: Hydrocodone as needed. Monitor with increased mobility 4. Mood/depression: Cymbalta 60 mg twice a day. Provide emotional support 5. Neuropsych: This patient is capable of making decisions on her own behalf. 6. Skin/Wound Care: Routine skin checks 7. Fluids/Electrolytes/Nutrition: encourage po, follow chemistries  -asked RD to follow up    -poor po intake likely multifactorial--  -megace trial  -treat mild thrush 8. Seizure disorder. Keppra 500 mg 3 times a day, Vimpat 200 mg twice a day. Monitor for any seizure activity 9. Dysphagia. Dysphagia 3 nectar liquids. Follow speech therapy. Monitor hydration 10. Constipation with persistent nausea vomiting. Resolving. Continue Mira lax twice a day as well as Reglan.   -recheck KUB today to assess for retained stool 11. Hypertension. Clonidine patch 0.3 mg weekly, Lasix 40 mg twice a day. Markedly increased mobility 12 hypothyroidism. Synthroid 13. History of left breast cancer. Continue  arimidex 14. Diabetes mellitus and peripheral neuropathy. Hemoglobin A1c 9.4. Sliding scale insulin. Patient on Liraglutide 18 mg subcutaneously daily prior to admission. 15. History of gout. Colchicine 0.6 mg twice a day. Monitor for any gout flareups---holding for the moment 16. COPD. Continue Dulera 2 puffs twice daily. No shortness of breath  LOS (Days) 2 A FACE TO FACE EVALUATION WAS PERFORMED  Morgan [name] T 05/31/2014 9:59 AM

## 2014-05-31 NOTE — IPOC Note (Signed)
Overall Plan of Care [facility]) Patient Details Name: Morgan [name] MRN: [id number] DOB: [birth year]  Admitting Diagnosis: Tumor resection  Hospital Problems: Principal Problem:   Meningioma Active Problems:   Type 2 diabetes mellitus with polyneuropathy   Left hemiparesis   DM neuropathy with neurologic complication     Functional Problem List: Nursing Nutrition, Bowel, Perception, Safety, Endurance, Medication Management  PT Balance, Safety, Endurance, Motor  OT Balance, Cognition, Endurance, Motor, Safety  SLP Cognition  TR         Basic ADL's: OT Grooming, Eating, Bathing, Dressing, Toileting     Advanced  ADL's: OT Simple Meal Preparation     Transfers: PT Bed Mobility, Bed to Chair, Car  OT Toilet, Tub/Shower     Locomotion: PT Ambulation, Emergency planning/management officer, Stairs     Additional Impairments: OT None  SLP Swallowing, Social Cognition   Social Interaction, Awareness, Problem Solving, Memory, Attention  TR      Anticipated Outcomes Item Anticipated Outcome  Self Feeding modified independent  Swallowing  Mod I with least restictive diet   Basic self-care  supervision  Toileting  supervision   Bathroom Transfers supervision  Bowel/Bladder  manage bowel with medication and level 5 assist to [facility]; manage bladder with level 5 assist of use of BSC  Transfers  S for transfers  Locomotion  S for ambulation, I w/c mobility, min guard for stairs  Communication  Mod I for use of an increased vocal intensity   Cognition  Supervision   Pain  n/a  Safety/Judgment  maintain safety with level 5 assist of staff and family   Therapy Plan: PT Intensity: Minimum of 1-2 x/day ,45 to 90 minutes PT Frequency: 5 out of 7 days PT Duration Estimated Length of Stay: 16 to 18 days OT Intensity: Minimum of 1-2 x/day, 45 to 90 minutes OT Frequency: 5 out of 7 days OT Duration/Estimated Length of Stay: 14-17 days SLP Intensity: Minumum of 1-2 x/day, 30 to 90  minutes SLP Frequency: 3 to 5 out of 7 days SLP Duration/Estimated Length of Stay: 2.5 weeks        Team Interventions: Nursing Interventions Patient/Family Education, Disease Management/Prevention, Discharge Planning, Bowel Management, Medication Management  PT interventions Ambulation/gait training, Balance/vestibular training, Disease management/prevention, Discharge planning, DME/adaptive equipment instruction, Functional electrical stimulation, Functional mobility training, Patient/family education, Neuromuscular re-education, Psychosocial support, Splinting/orthotics, Therapeutic Exercise, Therapeutic Activities, Stair training, UE/LE Strength taining/ROM, Wheelchair propulsion/positioning, UE/LE Coordination activities  OT Interventions Balance/vestibular training, Cognitive remediation/compensation, DME/adaptive equipment instruction, Neuromuscular re-education, Self Care/advanced ADL retraining, Therapeutic Exercise, UE/LE Strength taining/ROM, Patient/family education, UE/LE Coordination activities, Therapeutic Activities, Discharge planning, Functional mobility training  SLP Interventions Cognitive remediation/compensation, Cueing hierarchy, Dysphagia/aspiration precaution training, Environmental controls, Functional tasks, Internal/external aids, Patient/family education, Therapeutic Activities  TR Interventions    SW/CM Interventions      Team Discharge Planning: Destination: PT-Home ,OT- Home , SLP-Home Projected Follow-up: PT-Home health PT, OT-  Home health OT, SLP-Outpatient SLP, Home Health SLP, 24 hour supervision/assistance Projected Equipment Needs: PT-To be determined, OT- To be determined, SLP-To be determined Equipment Details: PT- , OT-  Patient/family involved in discharge planning: PT- Family member/caregiver, Patient,  OT-Patient, Family member/caregiver, SLP-Patient, Family member/caregiver  MD ELOS: 14-17 days Medical Rehab Prognosis:  Excellent Assessment: The  patient has been admitted for CIR therapies with the diagnosis of right frontal meningioma s/p resection. The team will be addressing functional mobility, strength, stamina, balance, safety, adaptive techniques and equipment, self-care, bowel and bladder mgt, patient and caregiver education,  NMR, visual -spatial awareness, cognition, nutrition, community reintegration. Goals have been set at mod I to supervision for ADL's, self-care, mobility,locomotion, and cognition/swallowing/communication.    Meredith Staggers, MD, FAAPMR      See Team Conference Notes for weekly updates to the plan of care

## 2014-05-31 NOTE — Progress Notes (Signed)
05/31/14 1140 nsg Unsuccessful PIV insertion by 2 IV RN's; very hard stick with left arm rest restricted; Dr. Naaman Plummer notifed.

## 2014-06-01 ENCOUNTER — Inpatient Hospital Stay (HOSPITAL_COMMUNITY): Payer: Medicare Other | Admitting: Speech Pathology

## 2014-06-01 ENCOUNTER — Encounter (HOSPITAL_COMMUNITY): Payer: Medicare Other

## 2014-06-01 ENCOUNTER — Inpatient Hospital Stay (HOSPITAL_COMMUNITY): Payer: Medicare Other

## 2014-06-01 LAB — COMPREHENSIVE METABOLIC PANEL
ALT: 42 U/L — ABNORMAL HIGH (ref 0–35)
AST: 49 U/L — ABNORMAL HIGH (ref 0–37)
Albumin: 2.1 g/dL — ABNORMAL LOW (ref 3.5–5.2)
Alkaline Phosphatase: 320 U/L — ABNORMAL HIGH (ref 39–117)
Anion gap: 6 (ref 5–15)
BUN: 12 mg/dL (ref 6–23)
CALCIUM: 8.1 mg/dL — AB (ref 8.4–10.5)
CHLORIDE: 98 mmol/L (ref 96–112)
CO2: 27 mmol/L (ref 19–32)
CREATININE: 1.3 mg/dL — AB (ref 0.50–1.10)
GFR calc Af Amer: 46 mL/min — ABNORMAL LOW (ref >90)
GFR calc non Af Amer: 40 mL/min — ABNORMAL LOW (ref >90)
GLUCOSE: 123 mg/dL — AB (ref 70–99)
Potassium: 4.4 mmol/L (ref 3.5–5.1)
Sodium: 131 mmol/L — ABNORMAL LOW (ref 135–145)
Total Bilirubin: 0.5 mg/dL (ref 0.3–1.2)
Total Protein: 5.7 g/dL — ABNORMAL LOW (ref 6.0–8.3)

## 2014-06-01 LAB — CBC WITH DIFFERENTIAL/PLATELET
Basophils Absolute: 0 10*3/uL (ref 0.0–0.1)
Basophils Relative: 1 % (ref 0–1)
EOS ABS: 0.3 10*3/uL (ref 0.0–0.7)
Eosinophils Relative: 5 % (ref 0–5)
HEMATOCRIT: 29.3 % — AB (ref 36.0–46.0)
Hemoglobin: 9.7 g/dL — ABNORMAL LOW (ref 12.0–15.0)
LYMPHS PCT: 38 % (ref 12–46)
Lymphs Abs: 2.3 10*3/uL (ref 0.7–4.0)
MCH: 30 pg (ref 26.0–34.0)
MCHC: 33.1 g/dL (ref 30.0–36.0)
MCV: 90.7 fL (ref 78.0–100.0)
MONOS PCT: 9 % (ref 3–12)
Monocytes Absolute: 0.5 10*3/uL (ref 0.1–1.0)
NEUTROS ABS: 2.8 10*3/uL (ref 1.7–7.7)
Neutrophils Relative %: 47 % (ref 43–77)
Platelets: 193 10*3/uL (ref 150–400)
RBC: 3.23 MIL/uL — AB (ref 3.87–5.11)
RDW: 14.4 % (ref 11.5–15.5)
WBC: 6 10*3/uL (ref 4.0–10.5)

## 2014-06-01 LAB — GLUCOSE, CAPILLARY
GLUCOSE-CAPILLARY: 171 mg/dL — AB (ref 70–99)
Glucose-Capillary: 105 mg/dL — ABNORMAL HIGH (ref 70–99)
Glucose-Capillary: 165 mg/dL — ABNORMAL HIGH (ref 70–99)
Glucose-Capillary: 135 mg/dL — ABNORMAL HIGH (ref 70–99)
Glucose-Capillary: 155 mg/dL — ABNORMAL HIGH (ref 70–99)
Glucose-Capillary: 290 mg/dL — ABNORMAL HIGH (ref 70–99)
Glucose-Capillary: 278 mg/dL — ABNORMAL HIGH (ref 70–99)

## 2014-06-01 MED ORDER — INSULIN ASPART 100 UNIT/ML ~~LOC~~ SOLN
0.0000 [IU] | Freq: Three times a day (TID) | SUBCUTANEOUS | Status: DC
  Administered 2014-06-01: 11 [IU] via SUBCUTANEOUS
  Administered 2014-06-01 (×2): 4 [IU] via SUBCUTANEOUS
  Administered 2014-06-01: 3 [IU] via SUBCUTANEOUS
  Administered 2014-06-02 (×2): 7 [IU] via SUBCUTANEOUS
  Administered 2014-06-02: 11 [IU] via SUBCUTANEOUS
  Administered 2014-06-02: 3 [IU] via SUBCUTANEOUS
  Administered 2014-06-03 (×3): 4 [IU] via SUBCUTANEOUS
  Administered 2014-06-03: 7 [IU] via SUBCUTANEOUS
  Administered 2014-06-05: 4 [IU] via SUBCUTANEOUS
  Administered 2014-06-05: 7 [IU] via SUBCUTANEOUS
  Administered 2014-06-08: 3 [IU] via SUBCUTANEOUS
  Administered 2014-06-09: 4 [IU] via SUBCUTANEOUS
  Administered 2014-06-09: 7 [IU] via SUBCUTANEOUS
  Administered 2014-06-09: 3 [IU] via SUBCUTANEOUS
  Administered 2014-06-09: 4 [IU] via SUBCUTANEOUS
  Administered 2014-06-10: 15 [IU] via SUBCUTANEOUS
  Administered 2014-06-10: 4 [IU] via SUBCUTANEOUS
  Administered 2014-06-11: 15 [IU] via SUBCUTANEOUS

## 2014-06-01 NOTE — Progress Notes (Signed)
Patient information reviewed and entered into eRehab system by Nephi Savage, RN, CRRN, PPS Coordinator.  Information including medical coding and functional independence measure will be reviewed and updated through discharge.     Per nursing patient was given "Data Collection Information Summary for Patients in Inpatient Rehabilitation Facilities with attached "Privacy Act Statement-Health Care Records" upon admission.  

## 2014-06-01 NOTE — Progress Notes (Signed)
Occupational Therapy Session Note  Patient Details  Name: Morgan [name] MRN: [id number] Date of Birth: April 21, 1940  Today's Date: 06/01/2014 OT Individual Time: 0900-1000 OT Individual Time Calculation (min): 60 min    Short Term Goals: Week 1:  OT Short Term Goal 1 (Week 1): Pt will initiate and complete all bathing with no more than min instructional cueing. OT Short Term Goal 2 (Week 1): Pt will keep her eyes open 75% of ADL session demonstrating sustained attention. OT Short Term Goal 3 (Week 1): Pt will perform LB bathing with min assist sit to stand. OT Short Term Goal 4 (Week 1): Pt will perform LB dressing with min assist sit to stand.  OT Short Term Goal 5 (Week 1): Pt will perform toilet transfer with min assist to elevated toilet or 3:1.   Skilled Therapeutic Interventions/Progress Updates:    Pt resting in bed upon arrival with son present.  Pt agreeable to therapy but declined shower, preferring to bathe at sink this morning.  Pt agreed to shower tomorrow morning.  Pt performed stand pivot transfer to w/c and completed bathing and dressing tasks with sit<>stand from w/c at sink.  Pt initially required mod A for sit<>stand from EOB but as session progressed patient performed sit<>stand with steady A and maintained standing with steady A.  Pt fatigues quickly and required multiple rest breaks throughout session. Pt required max verbal cues to open eyes and completed 75% of tasks with eyes closed.  Pt requested to use toilet and performed stand pivot transfer with grab bars to toilet with mod A.  Focus on activity tolerance, sit<>stand, standing balance, functional transfers, and safety awareness.  Therapy Documentation Precautions:  Precautions Precautions: Fall Precaution Comments: decreased sustained attention Restrictions Weight Bearing Restrictions: No Pain: Pain Assessment Pain Assessment: No/denies pain  See FIM for current functional status  Therapy/Group:  Individual Therapy  Leroy Libman 06/01/2014, 10:55 AM

## 2014-06-01 NOTE — Care Management Note (Signed)
[facility] Individual Statement of Services  Patient Name:  TISHAUNA NEALON  Date:  06/01/2014  Welcome to the [facility].  Our goal is to provide you with an individualized program based on your diagnosis and situation, designed to meet your specific needs.  With this comprehensive rehabilitation program, you will be expected to participate in at least 3 hours of rehabilitation therapies Monday-Friday, with modified therapy programming on the weekends.  Your rehabilitation program will include the following services:  Physical Therapy (PT), Occupational Therapy (OT), Speech Therapy (ST), 24 hour per day rehabilitation nursing, Therapeutic Recreaction (TR), Neuropsychology, Case Management (Social Worker), Rehabilitation Medicine, Nutrition Services and Pharmacy Services  Weekly team conferences will be held on Tuesdays to discuss your progress.  Your Social Worker will talk with you frequently to get your input and to update you on team discussions.  Team conferences with you and your family in attendance may also be held.  Expected length of stay: 2-3 weeks  Overall anticipated outcome: supervision  Depending on your progress and recovery, your program may change. Your Social Worker will coordinate services and will keep you informed of any changes. Your Social Worker's name and contact numbers are listed  below.  The following services may also be recommended but are not provided by the [facility] will be made to provide these services after discharge if needed.  Arrangements include referral to agencies that provide these services.  Your insurance has been verified to be:  Medicare and [place] Your primary doctor is:  Dr. Bea Graff  Pertinent information will be shared with your doctor and your insurance  company.  Social Worker:  [place], [place] or ([phone]   Information discussed with and copy given to patient by: Lennart Pall, 06/01/2014, 1:10 PM

## 2014-06-01 NOTE — Progress Notes (Signed)
Speech Language Pathology Daily Session Note  Patient Details  Name: Morgan [name] MRN: [id number] Date of Birth: April 08, 1941  Today's Date: 06/01/2014 SLP Individual Time: [id number] SLP Individual Time Calculation (min): 60 min  Short Term Goals: Week 1: SLP Short Term Goal 1 (Week 1): Patient will consume current diet with minimal overt s/s of aspiration with Min Averbal cues for use of swallowing strategies  SLP Short Term Goal 2 (Week 1): Patient will consume trials of thin liquids with minimal overt s/s of aspiration with Min A multimodal cues needed for use of small sips.  SLP Short Term Goal 3 (Week 1): Patient will keep her eyes open during functional tasks for 5 minutes with Max A multimodal cues.  SLP Short Term Goal 4 (Week 1): Patient will recall new, daily information with Min A multimodal cues for use of external memory aids SLP Short Term Goal 5 (Week 1): Patient will utilize an increased vocal intensity at the phrase level with supervision verbal cues.   Skilled Therapeutic Interventions:  Pt was seen for skilled ST targeting dysphagia goals.  Upon arrival, pt was reclined in bed, asleep, but awakened to voice and light touch and agreeable to participate in ST with min encouragement.  Pt was transferred to sitting upright at the edge of the bed to maximize attention, alertness and swallowing function during PO trials.  SLP provided min cues to initiate and sequence oral care in a timely manner prior to trials of regular water.  Pt consumed trials of regular water via cup sips and demonstrated x1 immediate reflexive cough following initial sip; no further s/s of aspiration followed.  Pt declined presentations of dys 3 textures due to fatigue; however, she was amenable to consuming an icee, during which she exhibited no overt s/s of aspiration.  SLP recommends a repeat MBS to objectively determine readiness for liquids advancement. Continue per current plan of care.    FIM:   Comprehension Comprehension Mode: Auditory Comprehension: 4-Understands basic 75 - 89% of the time/requires cueing 10 - 24% of the time Expression Expression Mode: Verbal Expression: 3-Expresses basic 50 - 74% of the time/requires cueing 25 - 50% of the time. Needs to repeat parts of sentences. Social Interaction Social Interaction: 5-Interacts appropriately 90% of the time - Needs monitoring or encouragement for participation or interaction. Problem Solving Problem Solving: 3-Solves basic 50 - 74% of the time/requires cueing 25 - 49% of the time Memory Memory: 3-Recognizes or recalls 50 - 74% of the time/requires cueing 25 - 49% of the time FIM - Eating Eating Activity: 5: Supervision/cues  Pain Pain Assessment Pain Assessment: No/denies pain  Therapy/Group: Individual Therapy  Morgan [name], Selinda Orion 06/01/2014, 4:05 PM

## 2014-06-01 NOTE — Progress Notes (Signed)
[facility] PHYSICAL MEDICINE & REHABILITATION     PROGRESS NOTE    Subjective/Complaints: Moved bowels yesterday. Nausea better. Still doesn't have much of an appetite.  Objective: Vital Signs: Blood pressure 145/66, pulse 104, temperature 97.6 F (36.4 C), temperature source Oral, resp. rate 18, SpO2 96 %. Dg Abd 2 Views  05/31/2014   CLINICAL DATA:  Constipation, epigastric abdominal pain, nausea, type 2 diabetes, hypertension, COPD  EXAM: ABDOMEN - 2 VIEW  COMPARISON:  05/23/2014  FINDINGS: Mildly increased stool throughout colon.  Small bowel gas pattern normal.  Small amount of gas within stomach.  No bowel dilatation bowel wall thickening or evidence of obstruction.  No free intraperitoneal air.  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  No urinary tract calcification.  IMPRESSION: Mildly increased stool throughout colon.   Electronically Signed   By: Lavonia Dana M.D.   On: 05/31/2014 11:27    Recent Labs  05/29/14 1940 06/01/14 0645  WBC 5.0 6.0  HGB 10.2* 9.7*  HCT 31.1* 29.3*  PLT 151 193    Recent Labs  05/29/14 1940  CREATININE 1.06   CBG (last 3)   Recent Labs  05/31/14 2013 06/01/14 0127 06/01/14 0355  GLUCAP 313* 105* 165*    Wt Readings from Last 3 Encounters:  05/19/14 100.3 kg (221 lb 1.9 oz)  01/04/12 109.09 kg (240 lb 8 oz)  09/11/11 110.496 kg (243 lb 9.6 oz)    Physical Exam:  No distressed. Sitting with eyes closed. Opens them on command  HENT: mild thrush on sides of tongue Craniotomy site well-healed. No edema  Eyes: Conjunctivae and EOM are normal. Pupils are equal, round, and reactive to light.  Pupils round and reactive to light without nystagmus  Neck: Normal range of motion. Neck supple. No JVD present. No tracheal deviation present. No thyromegaly present.  Cardiovascular: Normal rate and regular rhythm.  Respiratory: Effort normal and breath sounds normal. No respiratory distress. She has  no wheezes. She has no rales.  GI: Soft. Bowel sounds are normal. She exhibits no distension. There is no tenderness. There is no rebound.  Musculoskeletal:  She moves all extremities  Lymphadenopathy:   She has no cervical adenopathy.  Neurological:  Mood is appropriate.  Oriented to place, reason she's at hospital. f She was able to provide her name, age. UES: grossly 3-4/5 proximal to distal UE's. HF 3/5, KE 4-, ADF/APF 4+. Senses pain in all 4's. Decreased [place] on left.  Psychiatric: She has a normal mood and affect--more dynamic. Her behavior is normal.   Assessment/Plan: 1. Functional deficits secondary to right meningioma s/p resection which require 3+ hours per day of interdisciplinary therapy in a comprehensive inpatient rehab setting. Physiatrist is providing close team supervision and 24 hour management of active medical problems listed below. Physiatrist and rehab team continue to assess barriers to discharge/monitor patient progress toward functional and medical goals. FIM: FIM - Bathing Bathing Steps Patient Completed: Chest, Right Arm, Left Arm, Abdomen, Right upper leg, Left upper leg Bathing: 3: Mod-Patient completes 5-7 25f10 parts or 50-74%  FIM - Upper Body Dressing/Undressing Upper body dressing/undressing steps patient completed: Thread/unthread right sleeve of pullover shirt/dresss, Thread/unthread left sleeve of pullover shirt/dress, Put head through opening of pull over shirt/dress Upper body dressing/undressing: 4: Min-Patient completed 75 plus % of tasks FIM - Lower Body Dressing/Undressing Lower body dressing/undressing steps patient completed: Pull underwear up/down, Thread/unthread left pants leg Lower body dressing/undressing: 2: Max-Patient completed 25-49% of tasks  FIM - Toileting Toileting: 1: Total-Patient completed zero steps, helper did all 3  FIM - Toilet Transfers Toilet Transfers: 3-From toilet/BSC: Mod A (lift or lower assist), 3-To toilet/BSC:  Mod A (lift or lower assist)  FIM - Control and instrumentation engineer Devices: Arm rests, Walker, Bed rails Bed/Chair Transfer: 4: Supine > Sit: Min A (steadying Pt. > 75%/lift 1 leg), 3: Bed > Chair or W/C: Mod A (lift or lower assist)  FIM - Locomotion: Wheelchair Distance: 75 Locomotion: Wheelchair: 2: Travels 50 - 149 ft with minimal assistance (Pt.>75%) FIM - Locomotion: Ambulation Locomotion: Ambulation Assistive Devices: Administrator Ambulation/Gait Assistance: 4: Min assist Locomotion: Ambulation: 1: Travels less than 50 ft with minimal assistance (Pt.>75%)  Comprehension Comprehension Mode: Auditory Comprehension: 4-Understands basic 75 - 89% of the time/requires cueing 10 - 24% of the time  Expression Expression Mode: Verbal Expression: 3-Expresses basic 50 - 74% of the time/requires cueing 25 - 50% of the time. Needs to repeat parts of sentences.  Social Interaction Social Interaction: 4-Interacts appropriately 75 - 89% of the time - Needs redirection for appropriate language or to initiate interaction.  Problem Solving Problem Solving: 3-Solves basic 50 - 74% of the time/requires cueing 25 - 49% of the time  Memory Memory: 3-Recognizes or recalls 50 - 74% of the time/requires cueing 25 - 49% of the time Medical Problem List and Plan: 1. Functional deficits secondary to right frontal meningioma status post resection 05/08/2014 2. DVT Prophylaxis/Anticoagulation: Subcutaneous heparin initiated 05/22/2014. Monitor for any bleeding episodes 3. Pain Management: Hydrocodone as needed. Monitor with increased mobility 4. Mood/depression: Cymbalta 60 mg twice a day. Provide emotional support 5. Neuropsych: This patient is capable of making decisions on her own behalf. 6. Skin/Wound Care: Routine skin checks 7. Fluids/Electrolytes/Nutrition: encourage po, follow up chemistries as available  -asked RD to follow up,family can bring in food  -poor po intake  likely multifactorial--  -megace trial  -treat mild thrush 8. Seizure disorder. Keppra 500 mg 3 times a day, Vimpat 200 mg twice a day. Monitor for any seizure activity 9. Dysphagia. Dysphagia 3 nectar liquids. Follow speech therapy. Monitor hydration 10. Constipation with persistent nausea vomiting. Resolving. Continue Mira lax twice a day as well as Reglan.   -retained colonic stool.   -pt states she had a good bm yesterday---don't see one charted--will follow up with RN 11. Hypertension. Clonidine patch 0.3 mg weekly, Lasix 40 mg twice a day. Markedly increased mobility 12 hypothyroidism. Synthroid 13. History of left breast cancer. Continue arimidex 14. Diabetes mellitus and peripheral neuropathy. Hemoglobin A1c 9.4. Sliding scale insulin. Patient on Liraglutide 18 mg subcutaneously daily prior to admission. 15. History of gout. Colchicine 0.6 mg twice a day. Monitor for any gout flareups---resuming today 16. COPD. Continue Dulera 2 puffs twice daily. No shortness of breath  LOS (Days) 3 A FACE TO FACE EVALUATION WAS PERFORMED  SWARTZ,ZACHARY T 06/01/2014 8:26 AM

## 2014-06-01 NOTE — Progress Notes (Signed)
Physical Therapy Session Note  Patient Details  Name: Morgan [name] MRN: [id number] Date of Birth: [birth year]  Today's Date: 06/01/2014 PT Missed Time: 92 Minutes Missed Time Reason: Patient fatigue  Short Term Goals: Week 1:  PT Short Term Goal 1 (Week 1): Pt will increase transfers supine to edge of bed. edge of bed to supine with S.  PT Short Term Goal 2 (Week 1): Pt will increase transfers bed to chair, chair to bed with LRAD to min A.  PT Short Term Goal 3 (Week 1): Pt will increase ambulation with LRAD about 50 feet with min A.  PT Short Term Goal 4 (Week 1): Pt will propel w/c with B LEs about 100 feet with S.  PT Short Term Goal 5 (Week 1): Pt will ascend/descend 4 stairs with B rails and min A.   Skilled Therapeutic Interventions/Progress Updates:  Patient missed 60 minutes of scheduled skilled physical therapy this PM secondary to pt not feeling well and an overall feeling of fatigue. Pt unable to specify feeling but pt son also noted a change in status prior to session, RN aware and in room with pt. Will continue per current POC as appropriate.  Therapy Documentation Precautions:  Precautions Precautions: Fall Precaution Comments: decreased sustained attention Restrictions Weight Bearing Restrictions: No General: PT Amount of Missed Time (min): 60 Minutes PT Missed Treatment Reason: Patient fatigue;Patient ill (Comment) Vital Signs: Therapy Vitals Temp: 97.2 F (36.2 C) Temp Source: Oral Pulse Rate: (!) 108 Resp: 18 BP: (!) 120/59 mmHg Patient Position (if appropriate): Lying Oxygen Therapy SpO2: 94 % O2 Device: Not Delivered  See FIM for current functional status  Therapy/Group: Individual Therapy  Carlos Levering 06/01/2014, 1:16 PM

## 2014-06-02 ENCOUNTER — Inpatient Hospital Stay (HOSPITAL_COMMUNITY): Payer: Medicare Other | Admitting: *Deleted

## 2014-06-02 ENCOUNTER — Inpatient Hospital Stay (HOSPITAL_COMMUNITY): Payer: Medicare Other

## 2014-06-02 ENCOUNTER — Inpatient Hospital Stay (HOSPITAL_COMMUNITY): Payer: Medicare Other | Admitting: Speech Pathology

## 2014-06-02 ENCOUNTER — Encounter (HOSPITAL_COMMUNITY): Payer: Medicare Other

## 2014-06-02 LAB — GLUCOSE, CAPILLARY
GLUCOSE-CAPILLARY: 293 mg/dL — AB (ref 70–99)
GLUCOSE-CAPILLARY: 148 mg/dL — AB (ref 70–99)
Glucose-Capillary: 221 mg/dL — ABNORMAL HIGH (ref 70–99)
Glucose-Capillary: 220 mg/dL — ABNORMAL HIGH (ref 70–99)

## 2014-06-02 LAB — T4, FREE: FREE T4: 1.63 ng/dL (ref 0.80–1.80)

## 2014-06-02 LAB — TSH: TSH: 1.62 u[IU]/mL (ref 0.350–4.500)

## 2014-06-02 MED ORDER — SODIUM CHLORIDE 0.45 % IV SOLN: INTRAVENOUS | Status: DC

## 2014-06-02 MED ORDER — ENSURE COMPLETE PO LIQD
237.0000 mL | Freq: Two times a day (BID) | ORAL | Status: DC
  Administered 2014-06-02 – 2014-06-05 (×3): 237 mL via ORAL

## 2014-06-02 MED ORDER — ENSURE PUDDING PO PUDG
1.0000 | Freq: Two times a day (BID) | ORAL | Status: DC
  Administered 2014-06-03 – 2014-06-06 (×3): 1 via ORAL

## 2014-06-02 MED ORDER — METOCLOPRAMIDE HCL 5 MG PO TABS
5.0000 mg | ORAL_TABLET | Freq: Three times a day (TID) | ORAL | Status: DC
  Administered 2014-06-02 – 2014-06-03 (×4): 5 mg via ORAL
  Filled 2014-06-02 (×8): qty 1

## 2014-06-02 MED ORDER — INSULIN NPH (HUMAN) (ISOPHANE) 100 UNIT/ML ~~LOC~~ SUSP
5.0000 [IU] | Freq: Two times a day (BID) | SUBCUTANEOUS | Status: DC
  Administered 2014-06-02: 5 [IU] via SUBCUTANEOUS
  Filled 2014-06-02: qty 10

## 2014-06-02 NOTE — Progress Notes (Signed)
Physical Therapy Session Note  Patient Details  Name: Morgan [name] MRN: [id number] Date of Birth: December 31, 1940  Today's Date: 06/02/2014 PT Individual Time: 1130-1203 PT Individual Time Calculation (min): 33 min   Short Term Goals: Week 1:  PT Short Term Goal 1 (Week 1): Pt will increase transfers supine to edge of bed. edge of bed to supine with S.  PT Short Term Goal 2 (Week 1): Pt will increase transfers bed to chair, chair to bed with LRAD to min A.  PT Short Term Goal 3 (Week 1): Pt will increase ambulation with LRAD about 50 feet with min A.  PT Short Term Goal 4 (Week 1): Pt will propel w/c with B LEs about 100 feet with S.  PT Short Term Goal 5 (Week 1): Pt will ascend/descend 4 stairs with B rails and min A.   Skilled Therapeutic Interventions/Progress Updates:  Tx focused on functional mobility training as well as strengthening and activity tolerance.  Pt  Received supine with eyes closed, needed mod A to participate due to fatigue. No c/o pain.Pt reports that she now keeps her eyes closed frequently due to "not wanting to see herself." Emotional support provided, and pt educated on importance of keeping eyes open remain oriented for balance.   Pt performed supine>sit with Min A and HOB raised. Pt able to sit unsupported x41min with close S and 1 UE support, trunk flexed.  Pt challenged with multiple sit<>stands with/without RW for LE strength throughout tx with Mod A for lifting. Stand-pivot transfers with up to max A to lift and safely complete turn. Pt encouraged to keep eyes open throughout. Pt challenged to remain standing 3x45' with RW and min-guard while looking at and describing family pictures.  Pt was instructed in seated LE therex for functional strengthening 2x10 for LAQ, marching, ankle pumps with encouragement for full ROM.   Pt left up in [facility] with daughter present.      Therapy Documentation Precautions:  Precautions Precautions: Fall Precaution Comments:  decreased sustained attention Restrictions Weight Bearing Restrictions: No General:   Vital Signs: Oxygen Therapy SpO2: 98 % O2 Device: Not Delivered Pain: Pain Assessment Pain Assessment: No/denies pain  See FIM for current functional status  Therapy/Group: Individual Therapy   Kennieth Rad, PT, DPT  06/02/2014, 12:51 PM

## 2014-06-02 NOTE — Progress Notes (Signed)
Patient still with poor by mouth intake although diet was advanced today to mechanical soft with thin liquids. Latest creatinine of 1.30. Initiated IV fluids daily at bedtime and will follow up chemistries. May consider checking calorie counts

## 2014-06-02 NOTE — Progress Notes (Signed)
Patient still with some nausea this morning and one vomiting episode with limited by mouth intake. She currently remains on nectar thick liquids. Will begin IV fluids for hydration follow-up chemistries in the a.m.

## 2014-06-02 NOTE — Progress Notes (Addendum)
[place] PHYSICAL MEDICINE & REHABILITATION     PROGRESS NOTE    Subjective/Complaints: Moved bowels yesterday. Nausea better. Still doesn't have much of an appetite.  Objective: Vital Signs: Blood pressure 110/66, pulse 107, temperature 98.8 F (37.1 C), temperature source Oral, resp. rate 17, SpO2 93 %. Dg Abd 2 Views  05/31/2014   CLINICAL DATA:  Constipation, epigastric abdominal pain, nausea, type 2 diabetes, hypertension, COPD  EXAM: ABDOMEN - 2 VIEW  COMPARISON:  05/23/2014  FINDINGS: Mildly increased stool throughout colon.  Small bowel gas pattern normal.  Small amount of gas within stomach.  No bowel dilatation bowel wall thickening or evidence of obstruction.  No free intraperitoneal air.  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  No urinary tract calcification.  IMPRESSION: Mildly increased stool throughout colon.   Electronically Signed   By: Lavonia Dana M.D.   On: 05/31/2014 11:27    Recent Labs  06/01/14 0645  WBC 6.0  HGB 9.7*  HCT 29.3*  PLT 193    Recent Labs  06/01/14 0645  NA 131*  K 4.4  CL 98  GLUCOSE 123*  BUN 12  CREATININE 1.30*  CALCIUM 8.1*   CBG (last 3)   Recent Labs  06/01/14 2010 06/01/14 2128 06/02/14 0647  GLUCAP 290* 278* 221*    Wt Readings from Last 3 Encounters:  05/19/14 100.3 kg (221 lb 1.9 oz)  01/04/12 109.09 kg (240 lb 8 oz)  09/11/11 110.496 kg (243 lb 9.6 oz)    Physical Exam:  No distressed. Sitting with eyes closed. Opens them on command  HENT: mild thrush on sides of tongue Craniotomy site well-healed. No edema  Eyes: Conjunctivae and EOM are normal. Pupils are equal, round, and reactive to light.  Pupils round and reactive to light without nystagmus  Neck: Normal range of motion. Neck supple. No JVD present. No tracheal deviation present. No thyromegaly present.  Cardiovascular: Normal rate and regular rhythm.  Respiratory: Effort normal and breath sounds normal.  No respiratory distress. She has no wheezes. She has no rales.  GI: Soft. Bowel sounds are normal. She exhibits no distension. There is no tenderness. There is no rebound.  Musculoskeletal:  She moves all extremities  Lymphadenopathy:   She has no cervical adenopathy.  Neurological:  Mood is appropriate.  Oriented to place, reason she's at hospital. f She was able to provide her name, age. UES: grossly 3-4/5 proximal to distal UE's. HF 3/5, KE 4-, ADF/APF 4+. Senses pain in all 4's. Decreased [place] on left.  Psychiatric: She has a normal mood and affect--more dynamic. Her behavior is normal.   Assessment/Plan: 1. Functional deficits secondary to right meningioma s/p resection which require 3+ hours per day of interdisciplinary therapy in a comprehensive inpatient rehab setting. Physiatrist is providing close team supervision and 24 hour management of active medical problems listed below. Physiatrist and rehab team continue to assess barriers to discharge/monitor patient progress toward functional and medical goals. FIM: FIM - Bathing Bathing Steps Patient Completed: Chest, Right Arm, Left Arm, Abdomen, Front perineal area, Right upper leg, Left upper leg Bathing: 3: Mod-Patient completes 5-7 [id number] parts or 50-74%  FIM - Upper Body Dressing/Undressing Upper body dressing/undressing steps patient completed: Thread/unthread right sleeve of pullover shirt/dresss, Thread/unthread left sleeve of pullover shirt/dress, Put head through opening of pull over shirt/dress Upper body dressing/undressing: 4: Min-Patient completed 75 plus % of tasks FIM - Lower Body Dressing/Undressing Lower body dressing/undressing steps patient completed: Thread/unthread  left underwear leg, Pull underwear up/down Lower body dressing/undressing: 2: Max-Patient completed 25-49% of tasks  FIM - Toileting Toileting steps completed by patient: Performs perineal hygiene Toileting: 2: Max-Patient completed 1 of 3  steps  FIM - Toilet Transfers Toilet Transfers: 3-To toilet/BSC: Mod A (lift or lower assist), 3-From toilet/BSC: Mod A (lift or lower assist)  FIM - Control and instrumentation engineer Devices: Arm rests, Bed rails Bed/Chair Transfer: 4: Supine > Sit: Min A (steadying Pt. > 75%/lift 1 leg), 3: Bed > Chair or W/C: Mod A (lift or lower assist)  FIM - Locomotion: Wheelchair Distance: 75 Locomotion: Wheelchair: 2: Travels 50 - 149 ft with minimal assistance (Pt.>75%) FIM - Locomotion: Ambulation Locomotion: Ambulation Assistive Devices: Administrator Ambulation/Gait Assistance: 4: Min assist Locomotion: Ambulation: 1: Travels less than 50 ft with minimal assistance (Pt.>75%)  Comprehension Comprehension Mode: Auditory Comprehension: 4-Understands basic 75 - 89% of the time/requires cueing 10 - 24% of the time  Expression Expression Mode: Verbal Expression: 3-Expresses basic 50 - 74% of the time/requires cueing 25 - 50% of the time. Needs to repeat parts of sentences.  Social Interaction Social Interaction: 5-Interacts appropriately 90% of the time - Needs monitoring or encouragement for participation or interaction.  Problem Solving Problem Solving: 3-Solves basic 50 - 74% of the time/requires cueing 25 - 49% of the time  Memory Memory: 3-Recognizes or recalls 50 - 74% of the time/requires cueing 25 - 49% of the time Medical Problem List and Plan: 1. Functional deficits secondary to right frontal meningioma status post resection 05/08/2014  -more sedated over last 18 hours--can fluctuate.   -will check urine/cx  -recheck head ct as it's been almost a month since the last one  -observe patient clinically  -labwork unremarkable yesterday--afebrile  -reduce meds. And check thyroid function panel 2. DVT Prophylaxis/Anticoagulation: Subcutaneous heparin initiated 05/22/2014. Monitor for any bleeding episodes 3. Pain Management: Hydrocodone as needed. Monitor with  increased mobility 4. Mood/depression: Cymbalta 60 mg twice a day. Provide emotional support 5. Neuropsych: This patient is capable of making decisions on her own behalf. 6. Skin/Wound Care: Routine skin checks 7. Fluids/Electrolytes/Nutrition: encourage po,   -asked RD to follow up,family can bring in food  -poor po intake likely multifactorial--  -megace trial  -rx thrush 8. Seizure disorder. Keppra 500 mg 3 times a day, Vimpat 200 mg twice a day.    -consider neuro follow up if there is ongoing suspicion of absence type sz activity 9. Dysphagia. Dysphagia 3 nectar liquids. Follow speech therapy. Monitor hydration 10. Constipation with persistent nausea vomiting. Resolving. Continue Mira lax twice a day as well as Reglan.   -working on better po intake and bowel program 11. Hypertension. Clonidine patch 0.3 mg weekly, Lasix 40 mg twice a day. Markedly increased mobility 12 hypothyroidism. Synthroid 13. History of left breast cancer. Continue arimidex 14. Diabetes mellitus and peripheral neuropathy. Hemoglobin A1c 9.4. Sliding scale insulin. Patient on Liraglutide 18 mg subcutaneously daily prior to admission.  -begin nph insulin to better control sugars for now---titrate as needed. 15. History of gout. Colchicine 0.6 mg twice a day resumed 16. COPD. Continue Dulera 2 puffs twice daily. No shortness of breath  LOS (Days) 4 A FACE TO FACE EVALUATION WAS PERFORMED  SWARTZ,ZACHARY T 06/02/2014 8:29 AM

## 2014-06-02 NOTE — Progress Notes (Signed)
Occupational Therapy Session Note  Patient Details  Name: Morgan [name] MRN: [id number] Date of Birth: [birth year]  Today's Date: 06/02/2014 OT Individual Time: 0800-0900 OT Individual Time Calculation (min): 60 min    Short Term Goals: Week 1:  OT Short Term Goal 1 (Week 1): Pt will initiate and complete all bathing with no more than min instructional cueing. OT Short Term Goal 2 (Week 1): Pt will keep her eyes open 75% of ADL session demonstrating sustained attention. OT Short Term Goal 3 (Week 1): Pt will perform LB bathing with min assist sit to stand. OT Short Term Goal 4 (Week 1): Pt will perform LB dressing with min assist sit to stand.  OT Short Term Goal 5 (Week 1): Pt will perform toilet transfer with min assist to elevated toilet or 3:1.   Skilled Therapeutic Interventions/Progress Updates:    Pt resting in bed with daughter present upon arrival.  Pt agreeable with min encouragement to participate in therapy and agreed to taking a shower today.  Pt required mod A for all functional transfers this morning.  Pt completed bathing at shower level with sit<>stand using grab bars.  Pt completed dressing tasks with sit<>stand from w/c at sink.  Pt performed sit<>stand with steady A and maintained standing balance with steady A.  Pt's eyes remained closed 90% of time during session.  Focus on activity tolerance, functional transfers, sit<>stand, standing balance, task initiation, and safety awareness.  Therapy Documentation Precautions:  Precautions Precautions: Fall Precaution Comments: decreased sustained attention Restrictions Weight Bearing Restrictions: No Pain: Pain Assessment Pain Assessment: No/denies pain  See FIM for current functional status  Therapy/Group: Individual Therapy  Leroy Libman 06/02/2014, 9:01 AM

## 2014-06-02 NOTE — Progress Notes (Signed)
INITIAL NUTRITION ASSESSMENT  DOCUMENTATION CODES Per approved criteria  -Obesity Unspecified   INTERVENTION: Provide Strawberry Ensure Complete po BID, each supplement provides 350 kcal and 13 grams of protein.  Provide Ensure Pudding po BID, each supplement provides 170 kcal and 4 grams of protein.  Provide nourishment snacks (fruit cup, Magic cup).   Recommend obtaining new weight to fully assess weight trends.  Encourage adequate PO intake.  NUTRITION DIAGNOSIS: Inadequate oral intake related to decreased appetite as evidenced by varied meal completion of 0-80%.   Goal: Morgan [name] to meet >/= 90% of their estimated nutrition needs   Monitor:  PO intake, weight trends, labs, I/O's  Reason for Assessment: Poor PO intake  74 y.o. female  Admitting Dx: Meningioma  ASSESSMENT: Morgan [name] with history of hypertension, left breast cancer in remission 5 years, renal cancer with nephrectomy, diabetes mellitus with peripheral neuropathy and COPD. Admitted with altered mental status, seizure and left-sided weakness with difficulty in speech and gaze to the left. CT MRI and imaging showed evidence of enlarging poorly characterized mass at the high right frontal parietal region.   Morgan [name] reports having a decreased appetite. Meal completion has been varied from 0-80%. Morgan [name] is currently on thin liquids and not on nectar thick liquids. No new weight recorded. Morgan [name] was quickly weighed on bed scale and it was 215 lbs. Recommend accurately obtaining new weight to fully assess weight trends. Daughter at bedside. She reports Morgan [name] has been losing weight due to inadequate intake. Morgan [name] reports she would like strawberry Ensure ordered as that is only one she prefers. Morgan [name] reports additionally liking pudding. RD to order Ensure Shake and pudding.  Unable to perform nutrition focused physical exam during this time.  Labs and medications reviewed.  Height: Ht Readings from Last 1 Encounters:  05/08/14 5\' 4"  (1.626 m)     Weight: Wt Readings from Last 1 Encounters:  05/19/14 221 lb 1.9 oz (100.3 kg)    Ideal Body Weight: 120 lbs  % Ideal Body Weight: 184 lbs  Wt Readings from Last 10 Encounters:  05/19/14 221 lb 1.9 oz (100.3 kg)  01/04/12 240 lb 8 oz (109.09 kg)  09/11/11 243 lb 9.6 oz (110.496 kg)  03/23/11 243 lb 8 oz (110.451 kg)  12/15/10 248 lb 9.6 oz (112.764 kg)  11/02/10 251 lb (113.853 kg)  08/29/10 247 lb 12.8 oz (112.401 kg)    Usual Body Weight: 240 lbs  % Usual Body Weight: 92%  BMI:  Body Mass Index: 37.87 kg/(m^2) Class II obesity  Estimated Nutritional Needs: Kcal: 1700-1950 Protein: 95-100 grams Fluid: 1.7 - 1.95 L/day  Skin: Incision on head  Diet Order: DIET DYS 3   EDUCATION NEEDS: -No education needs identified at this time   Intake/Output Summary (Last 24 hours) at 06/02/14 1529 Last data filed at 06/02/14 1525  Gross per 24 hour  Intake    180 ml  Output      0 ml  Net    180 ml    Last BM: 2/21  Labs:   Recent Labs Lab 05/28/14 0829 05/29/14 0746 05/29/14 1940 06/01/14 0645  NA 134* 133*  --  131*  K 4.1 3.9  --  4.4  CL 96 97  --  98  CO2 32 28  --  27  BUN 19 14  --  12  CREATININE 0.98 0.99 1.06 1.30*  CALCIUM 8.4 8.3*  --  8.1*  MG 1.5 1.6  --   --   PHOS 4.1  3.9  --   --   GLUCOSE 152* 167*  --  123*    CBG (last 3)   Recent Labs  06/01/14 2128 06/02/14 0647 06/02/14 1215  GLUCAP 278* 221* 220*    Scheduled Meds: . anastrozole  1 mg Oral Daily  . [START ON 06/04/2014] cloNIDine  0.3 mg Transdermal Weekly  . colchicine  0.6 mg Oral BID  . DULoxetine  60 mg Oral BID  . furosemide  40 mg Oral BID  . guaiFENesin  600 mg Oral BID  . heparin  5,000 Units Subcutaneous 3 times per day  . insulin aspart  0-20 Units Subcutaneous TID WC & HS  . insulin NPH Human  5 Units Subcutaneous BID AC & HS  . lacosamide  200 mg Oral BID  . levETIRAcetam  500 mg Oral TID  . levothyroxine  200 mcg Oral Once per day on Mon Tue Wed Thu  Fri Sat  . levothyroxine  300 mcg Oral Q Sun  . Liraglutide  1.8 mg Subcutaneous Daily  . megestrol  400 mg Oral BID  . metoCLOPramide  5 mg Oral TID AC & HS  . mometasone-formoterol  2 puff Inhalation BID  . nystatin  5 mL Oral QID  . pantoprazole  40 mg Oral BID  . polyethylene glycol  17 g Oral BID  . scopolamine  1 patch Transdermal Q72H    Continuous Infusions: . sodium chloride      Past Medical History  Diagnosis Date  . Diabetes mellitus   . Obesity   . Diabetes type 2, uncontrolled   . Hypoglycemia associated with diabetes   . Edema of both legs   . Hypertension   . Combined hyperlipidemia   . Acquired autoimmune hypothyroidism   . Thyroiditis, autoimmune   . Abnormal liver function tests   . Sleep apnea, obstructive   . History of gastroesophageal reflux (GERD)   . Depression   . DM neuropathy with neurologic complication   . COPD with acute exacerbation   . Goiter   . Fatigue   . Vertigo   . Type II diabetes mellitus with peripheral angiopathy   . Gout   . Pallor     Past Surgical History  Procedure Laterality Date  . Laparoscopic gastric banding    . Back surgery    . Craniotomy Right 05/08/2014    Procedure: CRANIOTOMY FOR MENINGIOMA;  Surgeon: Ashok Pall, MD;  Location: [facility];  Service: Neurosurgery;  Laterality: Right;  Right Craniotomy for meningioma  . Esophagogastroduodenoscopy (egd) with propofol N/A 05/22/2014    Procedure: ESOPHAGOGASTRODUODENOSCOPY (EGD) WITH PROPOFOL;  Surgeon: Wonda Horner, MD;  Location: [facility] ENDOSCOPY;  Service: Endoscopy;  Laterality: N/A;    Kallie Locks, MS, RD, LDN Pager # [phone] After hours/ weekend pager # [phone]

## 2014-06-02 NOTE — Plan of Care (Signed)
Problem: RH Ambulation Goal: LTG Patient will ambulate in controlled environment (PT) LTG: Patient will ambulate in a controlled environment, # of feet with assistance (PT).  Goal modified to more accurately reflect anticipated level of functional mobility.  Goal: LTG Patient will ambulate in home environment (PT) LTG: Patient will ambulate in home environment, # of feet with assistance (PT).  Goal modified to more accurately reflect anticipated level of functional mobility.   Problem: RH Wheelchair Mobility Goal: LTG Patient will propel w/c in controlled environment (PT) LTG: Patient will propel wheelchair in controlled environment, # of feet with assist (PT)  Goal modified to more accurately reflect anticipated level of functional mobility.  Goal: LTG Patient will propel w/c in home environment (PT) LTG: Patient will propel wheelchair in home environment, # of feet with assistance (PT).  Goal modified to more accurately reflect anticipated level of functional mobility.

## 2014-06-02 NOTE — Progress Notes (Signed)
Physical Therapy Session Note  Patient Details  Name: Morgan [name] MRN: [id number] Date of Birth: [birth year]  Today's Date: 06/02/2014 PT Individual Time: 1300-1400 PT Individual Time Calculation (min): 60 min   Short Term Goals: Week 1:  PT Short Term Goal 1 (Week 1): Pt will increase transfers supine to edge of bed. edge of bed to supine with S.  PT Short Term Goal 2 (Week 1): Pt will increase transfers bed to chair, chair to bed with LRAD to min A.  PT Short Term Goal 3 (Week 1): Pt will increase ambulation with LRAD about 50 feet with min A.  PT Short Term Goal 4 (Week 1): Pt will propel w/c with B LEs about 100 feet with S.  PT Short Term Goal 5 (Week 1): Pt will ascend/descend 4 stairs with B rails and min A.   Skilled Therapeutic Interventions/Progress Updates:  1:1. Pt received supine in bed with daughter present, ready and willing to participate with min encouragement. Session focused on functional transfers, ambulation, furniture transfers, and w/c propulsion. All stand pivot transfers from bed/chair were completed with supervision-minA and use of RW. Pt was transported to therapy gym in w/c for time management and ambulated with RW and overall minA for 60'x1, demonstrated antalgic gait and favored left side of the RW with verbal cues for keeping RW close to body. 5xSTS was performed and patient scored 85 seconds with use of RW and B UE support, indicating high risks for falls. Pt performed furniture transfer from low couch in therapy apartment with modA to rise due to decreased LE strength. Pt performed bed mobility with overall supervision including B rolling and supine<>sit. W/c propulsion for 14' with modA and consistent mod cues for technique and steering.  Pt required consistent, prolonged seated rest breaks throughout session secondary to increased patient reports of fatigue and dizziness. Pt consistently with eyes closed due to reports of increased dizziness. Dizziness  reproduced with transitional movements such as supine<>sit<>stand, but not reported during rolling in bed. Pt also reported dizziness with spinning of w/c while seated and head facing forward, but did not report dizziness with w/c transport or propulsion. PT will continue to monitor dizziness symptoms throughout remainder of course of therapy.  Patient returned to room and left seated in w/c, daughter by side, all needs within reach.  Therapy Documentation Precautions:  Precautions Precautions: Fall Precaution Comments: decreased sustained attention Restrictions Weight Bearing Restrictions: No  See FIM for current functional status  Therapy/Group: Individual Therapy  Carlos Levering 06/02/2014, 3:13 PM

## 2014-06-02 NOTE — Progress Notes (Signed)
Patient regurgitated all of her 0800 oral meds. Stated that taking them crushed made her nauseous. Helyn Numbers., PA notified.

## 2014-06-02 NOTE — Progress Notes (Signed)
Social Work  Social Work Assessment and Plan  Patient Details  Name: Morgan [name] MRN: [id number] Date of Birth: [birth year]  Today's Date: 06/01/2014  Problem List:  Patient Active Problem List   Diagnosis Date Noted  . DM neuropathy with neurologic complication   . Acute respiratory failure, unspecified whether with hypoxia or hypercapnia   . Hyponatremia   . Peripheral neuropathy   . Essential hypertension   . Gastroesophageal reflux disease without esophagitis   . Meningioma   . Acute urinary retention   . HLD (hyperlipidemia)   . Other emphysema   . Other specified hypothyroidism   . Primary gout   . Brain mass 05/03/2014  . Acute encephalopathy 05/03/2014  . Left-sided weakness 05/03/2014  . Aphasia 05/03/2014  . Abnormal head CT   . Left hemiparesis   . 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 Peripheral angiopathy due to secondary diabetes 01/04/2012  . Diabetes type 2, uncontrolled   . Hypoglycemia associated with diabetes   . Edema of both legs   . Combined hyperlipidemia   . Acquired autoimmune hypothyroidism   . Thyroiditis, autoimmune   . Abnormal liver function tests   . Sleep apnea, obstructive   . History of gastroesophageal reflux (GERD)   . Depression   . Type 2 diabetes mellitus with polyneuropathy   . COPD with acute exacerbation   . Goiter   . Fatigue   . Vertigo   . Type II diabetes mellitus with peripheral angiopathy   . Gout   . Pallor   . Type II or unspecified type diabetes mellitus without mention of complication, uncontrolled 08/01/2010  . Hypertension 08/01/2010  . Mixed hyperlipidemia 08/01/2010  . Obesity 08/01/2010   Past Medical History:  Past Medical History  Diagnosis Date  . Diabetes mellitus   . Obesity   . Diabetes type 2, uncontrolled   . Hypoglycemia associated with diabetes   . Edema of both legs   . Hypertension   . Combined hyperlipidemia   . Acquired autoimmune hypothyroidism   . Thyroiditis,  autoimmune   . Abnormal liver function tests   . Sleep apnea, obstructive   . History of gastroesophageal reflux (GERD)   . Depression   . DM neuropathy with neurologic complication   . COPD with acute exacerbation   . Goiter   . Fatigue   . Vertigo   . Type II diabetes mellitus with peripheral angiopathy   . Gout   . Pallor    Past Surgical History:  Past Surgical History  Procedure Laterality Date  . Laparoscopic gastric banding    . Back surgery    . Craniotomy Right 05/08/2014    Procedure: CRANIOTOMY FOR MENINGIOMA;  Surgeon: Ashok Pall, MD;  Location: [facility];  Service: Neurosurgery;  Laterality: Right;  Right Craniotomy for meningioma  . Esophagogastroduodenoscopy (egd) with propofol N/A 05/22/2014    Procedure: ESOPHAGOGASTRODUODENOSCOPY (EGD) WITH PROPOFOL;  Surgeon: Wonda Horner, MD;  Location: [facility];  Service: Endoscopy;  Laterality: N/A;   Social History:  reports that she has never smoked. She does not have any smokeless tobacco history on file. She reports that she does not drink alcohol or use illicit drugs.  Family / Support Systems Marital Status: Married Patient Roles: Spouse, Parent Spouse/Significant Other: Eliany Hempfling @ (H) [phone] or [phone] Children: daughter, Adele Barthel [facility]) @ (C) [phone] and son, Tasheka Sol (3 miles from pt's home) @ (C) [phone] Anticipated Caregiver: son and daughter Ability/Limitations of Caregiver:  spouse recent injury and so son and daughter will have to assist.  Both children having been taking turns providing support to both parents and have missed quite of bit of work.  They both work f/t and, per son, they each need to start planning a return to their jobs. Caregiver Availability: 24/7 (if they privately hire caregiver) Family Dynamics: Son reports that he and his sister are very close with both of their parents and have "been through alot" over the past several months in terms of caregiving.  Son  very committed to provide as much support as they possibly can.  Social History Preferred language: English Religion: Methodist Cultural Background: NA Read: Yes Write: Yes Employment Status: Retired Date Retired/Disabled/Unemployed: approx 15 yrs Freight forwarder Issues: None Guardian/Conservator: None - per MD, pt capable of making decisions on her own behalf   Abuse/Neglect Physical Abuse: Denies Verbal Abuse: Denies Sexual Abuse: Denies Exploitation of patient/patient's resources: Denies Self-Neglect: Denies  Emotional Status Pt's affect, behavior adn adjustment status: Pt lying in bed with eyes closed, however, nods "yes" when asked if I could conduct interview.  Son at bedside and reports she seems to be keeping her eyes closed at all times "...but she is still listening..."  Attempted to ask her questions but got no responses except to "yes/ no" questions and she would offer head nods.  Son completing interview for her.  Discussed with son the concern of her eyes closed/ ?disengagement.  He is also concerned but cannot offer any reason she is doing this.  Will monitor behavior and likely refer to neuropsychology soon. Recent Psychosocial Issues: Pt's husband suffered traumatic injury in November while working on his farm.  Lengthy hospitalization at [facility] and then rehab at [place].  Had been home only 3 weeks when pt was admitted.  Much strain on two children as they have tried to provide close to 24/7 support for both parents. Pyschiatric History: Son reports pt with long-term hx of depression but no formal treatment.  Reports that primary MD placed her on anti-depressant.  Son feels her depression began when she stopped working "and she just didn't have the same level of activity and responsibility."  Also notes lability PTA which he now attributes to her brain tumor. Substance Abuse History: None  Patient / Family Perceptions, Expectations & Goals Pt/Family understanding  of illness & functional limitations: Pt and family with good understanding of her diagnosis, current functional limitations and probable assist needs at d/c. Premorbid pt/family roles/activities: Pt was independent PTA but had had some falls.  She had been providing assistance to husband after his return to home. Anticipated changes in roles/activities/participation: If pt returns directly home from CIR, children would assume caregiver roles. Pt/family expectations/goals: Family goals are for pt to be able to make good progress on CIR and d/c directly home.  Community [employer] Agencies: None Premorbid Home Care/DME Agencies: Other (Comment) ([facility] following pt's husband at home) Transportation available at discharge: yes Resource referrals recommended: Neuropsychology, Support group (specify)  Discharge Planning Living Arrangements: Spouse/significant other Support Systems: Spouse/significant other, Children, Church/faith community Type of Residence: Private residence Insurance Resources: [employer], Multimedia programmer (specify) Nurse, mental health) Financial Resources: [place] Referred: No Living Expenses: Own Money Management: Family Does the patient have any problems obtaining your medications?: No Home Management: pt  Patient/Family Preliminary Plans: Family hopeful pt could d/c home, however, report they may need to consider SNF if limited gains. Social Work Anticipated Follow Up Needs: HH/OP, SNF Expected  length of stay: 2-3 weeks  Clinical Impression Significantly impaired woman here following surgery for brain tumor removal.  Very limited engagement with this SW and son having to complete assessment interview.  Concern that this may be behavioral (per son) as she keeps her eyes closed at all times.  Family hopeful she would be able to d/c home and aware that 24/7 care will be needed.  Family also considering SNF if they cannot meet care needs.  Will follow  for support and d/c planning needs.  Magdalynn Davilla 06/01/2014, 1:23 PM

## 2014-06-02 NOTE — Progress Notes (Signed)
Speech Language Pathology Note  Patient Details  Name: MILLENIA CARANO MRN: [id number] Date of Birth: 21-Feb-1941 Today's Date: 06/02/2014  MBS completed today to objectively assess dysphagia and possible diet upgrade. Recommend diet upgrade to Dys. 3 textures with thin liquids via cup with full supervision. Report is now located under imaging. Thank you.    Reilyn Nelson 06/02/2014, 2:52 PM   [place]on Anna, [place], [place]

## 2014-06-03 ENCOUNTER — Inpatient Hospital Stay (HOSPITAL_COMMUNITY): Payer: Medicare Other | Admitting: Physical Therapy

## 2014-06-03 ENCOUNTER — Inpatient Hospital Stay (HOSPITAL_COMMUNITY): Payer: Medicare Other | Admitting: Speech Pathology

## 2014-06-03 ENCOUNTER — Inpatient Hospital Stay (HOSPITAL_COMMUNITY): Payer: Medicare Other

## 2014-06-03 ENCOUNTER — Inpatient Hospital Stay (HOSPITAL_COMMUNITY): Payer: Medicare Other | Admitting: *Deleted

## 2014-06-03 LAB — GLUCOSE, CAPILLARY
GLUCOSE-CAPILLARY: 213 mg/dL — AB (ref 70–99)
Glucose-Capillary: 184 mg/dL — ABNORMAL HIGH (ref 70–99)
Glucose-Capillary: 152 mg/dL — ABNORMAL HIGH (ref 70–99)
Glucose-Capillary: 187 mg/dL — ABNORMAL HIGH (ref 70–99)

## 2014-06-03 LAB — T4: T4 TOTAL: 9.5 ug/dL (ref 4.5–12.0)

## 2014-06-03 LAB — URINE MICROSCOPIC-ADD ON

## 2014-06-03 LAB — URINALYSIS, ROUTINE W REFLEX MICROSCOPIC
Bilirubin Urine: NEGATIVE
Glucose, UA: NEGATIVE mg/dL
Hgb urine dipstick: NEGATIVE
Ketones, ur: NEGATIVE mg/dL
Nitrite: NEGATIVE
Protein, ur: 30 mg/dL — AB
SPECIFIC GRAVITY, URINE: 1.008 (ref 1.005–1.030)
Urobilinogen, UA: 1 mg/dL (ref 0.0–1.0)
pH: 7.5 (ref 5.0–8.0)

## 2014-06-03 LAB — BASIC METABOLIC PANEL
ANION GAP: 15 (ref 5–15)
BUN: 14 mg/dL (ref 6–23)
CHLORIDE: 93 mmol/L — AB (ref 96–112)
CO2: 23 mmol/L (ref 19–32)
Calcium: 9 mg/dL (ref 8.4–10.5)
Creatinine, Ser: 1.36 mg/dL — ABNORMAL HIGH (ref 0.50–1.10)
GFR calc non Af Amer: 38 mL/min — ABNORMAL LOW (ref >90)
GFR, EST AFRICAN AMERICAN: 44 mL/min — AB (ref >90)
Glucose, Bld: 190 mg/dL — ABNORMAL HIGH (ref 70–99)
Potassium: 4.8 mmol/L (ref 3.5–5.1)
SODIUM: 131 mmol/L — AB (ref 135–145)

## 2014-06-03 LAB — T3: T3, Total: 96 ng/dL (ref 71–180)

## 2014-06-03 MED ORDER — SODIUM CHLORIDE 0.9 % IJ SOLN
10.0000 mL | INTRAMUSCULAR | Status: DC | PRN
  Administered 2014-06-04 – 2014-06-11 (×9): 10 mL
  Filled 2014-06-03 (×9): qty 40

## 2014-06-03 MED ORDER — SODIUM CHLORIDE 0.45 % IV SOLN
INTRAVENOUS | Status: DC
  Administered 2014-06-03: 75 mL/h via INTRAVENOUS
  Administered 2014-06-05 – 2014-06-08 (×3): via INTRAVENOUS

## 2014-06-03 MED ORDER — INSULIN NPH (HUMAN) (ISOPHANE) 100 UNIT/ML ~~LOC~~ SUSP
10.0000 [IU] | Freq: Two times a day (BID) | SUBCUTANEOUS | Status: DC
  Administered 2014-06-03 – 2014-06-10 (×15): 10 [IU] via SUBCUTANEOUS

## 2014-06-03 NOTE — Plan of Care (Signed)
Problem: RH KNOWLEDGE DEFICIT Goal: RH STG INCREASE KNOWLEDGE OF HYPERTENSION Patient will verbalize signs and symptoms of hyper/hypotension by discharge with min assistance  Outcome: Progressing Pt's son. Goal: RH STG INCREASE KNOWLEDGE OF DYSPHAGIA/FLUID INTAKE Outcome: Progressing Pt.'s son.

## 2014-06-03 NOTE — Progress Notes (Signed)
Peripherally Inserted Central Catheter/Midline Placement  The IV Nurse has discussed with the patient and/or persons authorized to consent for the patient, the purpose of this procedure and the potential benefits and risks involved with this procedure.  The benefits include less needle sticks, lab draws from the catheter and patient may be discharged home with the catheter.  Risks include, but not limited to, infection, bleeding, blood clot (thrombus formation), and puncture of an artery; nerve damage and irregular heat beat.  Alternatives to this procedure were also discussed.  PICC/Midline Placement Documentation        Morgan [name] 06/03/2014, 2:58 PM

## 2014-06-03 NOTE — Progress Notes (Signed)
Physical Therapy Session Note  Patient Details  Name: Morgan [name] MRN: [id number] Date of Birth: [birth year]  Today's Date: 06/03/2014 PT Individual Time: 1500-1511 PT Individual Time Calculation (min): 11 min   Short Term Goals: Week 1:  PT Short Term Goal 1 (Week 1): Pt will increase transfers supine to edge of bed. edge of bed to supine with S.  PT Short Term Goal 2 (Week 1): Pt will increase transfers bed to chair, chair to bed with LRAD to min A.  PT Short Term Goal 3 (Week 1): Pt will increase ambulation with LRAD about 50 feet with min A.  PT Short Term Goal 4 (Week 1): Pt will propel w/c with B LEs about 100 feet with S.  PT Short Term Goal 5 (Week 1): Pt will ascend/descend 4 stairs with B rails and min A.   Skilled Therapeutic Interventions/Progress Updates:    Patient received semi-reclined in bed. Session focused on checking son off on bed<>BSC transfers. Patient initially refusing therapy, stating she does not feel well. Son reports that between Vestibular Eval and IV being placed, patient is "worn out." Attempted to encourage patient to perform "mock" bed<>BSC transfer with son so he could be checked off to assist patient in room, but patient continues to refuse. After exiting room, patient's son calls to therapist that patient needs to use bathroom. With assistance provided by son, patient performed supine>sit with minA and HOB elevated and stand pivot transfer with RW and minA from bed<>BSC. Patient's son provides appropriate guarding and cues as well as managing hygiene and clothing. Patient's son, Morgan [name], checked off to perform bed<>BSC transfers with RW and minA in room; updated safety plan accordingly and RN aware.  Therapy Documentation Precautions:  Precautions Precautions: Fall Precaution Comments: decreased sustained attention Restrictions Weight Bearing Restrictions: No General: PT Amount of Missed Time (min): 91 Minutes PT Missed Treatment Reason: Patient  fatigue;Patient ill (Comment) Pain: Pain Assessment Pain Assessment: 0-10 Pain Score: 6  Pain Type: Acute pain Pain Location: Head Pain Descriptors / Indicators: Headache Pain Onset: On-going Pain Intervention(s): RN made aware;Repositioned;Ambulation/increased activity Multiple Pain Sites: No Locomotion : Ambulation Ambulation/Gait Assistance: Not tested (comment)   See FIM for current functional status  Therapy/Group: Individual Therapy  Lillia Abed. Klayten Jolliff, PT, DPT 06/03/2014, 3:41 PM

## 2014-06-03 NOTE — Progress Notes (Signed)
Physical Therapy Session Note  Patient Details  Name: Morgan [name] MRN: [id number] Date of Birth: 1941-04-03  Today's Date: 06/03/2014 PT Individual Time: 1300-1400 PT Individual Time Calculation (min): 60 min (Scheduled makeup session)   Short Term Goals: Week 1:  PT Short Term Goal 1 (Week 1): Pt will increase transfers supine to edge of bed. edge of bed to supine with S.  PT Short Term Goal 2 (Week 1): Pt will increase transfers bed to chair, chair to bed with LRAD to min A.  PT Short Term Goal 3 (Week 1): Pt will increase ambulation with LRAD about 50 feet with min A.  PT Short Term Goal 4 (Week 1): Pt will propel w/c with B LEs about 100 feet with S.  PT Short Term Goal 5 (Week 1): Pt will ascend/descend 4 stairs with B rails and min A.   Skilled Therapeutic Interventions/Progress Updates:    Scheduled makeup session focusing on assessing/addressing vestibular impairments to improve activity tolerance, increase safety with functional mobility. Pt reports 0/10 dizziness/disequilibrium at rest which increases to 7/10 with "quick or sudden movements," per pt. Pt reports secondary "lightheadedness" occurring with transition from supine > sit and from sit > stand; however, unable to reproduce lightheadedness during current session. Both primary disequilibrium and secondary lightheadedness are mitigated with closing eyes and "holding real still," per pt. No true vertigo or "spinning" occurred during this session. Question presence of photophobia, as pt able to better maintain eyes open with low light in room. Pt reporting diplopia with near gaze in R visual field. Son (present) noted disconjugate gaze just prior to admission to hospital.   Eye Alignment Appears grossly WNL; noted eye lid "fluttering" mitigated with decreased light.  Spontaneous  Nystagmus Not observed.  Gaze holding nystagmus Direction-changing nystagmus (symptom intensity to L > R)  Smooth pursuit Catchup saccades with  horizontal pursuit, bilaterally, and with tracking inferiorly.  Oculomotor Convergence not formally assessed due to decreased pt tolerance to testing.  Saccades Hypometric saccades noted in all quadrants.  VOR slow Impaired with horizontal head turns; vertical not formally assessed due to limited pt tolerance to testing.  Head Thrust Test Not tested.  Head Shaking Nystagmus Not tested.  RtMarye Round Not tested due to nature of pt's symptoms and risk of placing head in dependent position.  Henderson Newcomer Not tested; see above.  Rt. Roll Test Not tested.  Lt. Roll Test  Not tested.  Motion sensitivity Unable to discern if secondary "lightheadedness" is due to orthostasis vs. central vestibular impairments.  VOR Cancellation Not formally assessed due to decreased pt tolerance to testing.    Educated pt/son on findings and recommendations with focus on compensation as well as adaptation, with focus on VOR x1 viewing exercise starting in semi reclined. Son verbalized understanding. Pt will likely require reinforcement. Pt performed stand pivot transfers from bed <> BSC with rolling walker and mod A with verbal cueing to utilize visual target during transitional movements to increase pt tolerance to/safety with said movements. While using target, postural control was grossly unchanged; however, pt required less frequent rest breaks and reported feeling "better" (subjective dizziness rating 3/10 as opposed to 7/10) with use of visual targeting as compared to without. Departed with pt semi reclined in bed with 3 rails up, bed alarm on, and all needs within reach.  Therapy Documentation Precautions:  Precautions Precautions: Fall Precaution Comments: decreased sustained attention Restrictions Weight Bearing Restrictions: No Pain: Pain Assessment Pain Assessment: No/denies pain  See FIM for current functional status  Therapy/Group: Individual Therapy  Hobble, Malva Cogan 06/03/2014, 5:18 PM

## 2014-06-03 NOTE — Progress Notes (Signed)
Occupational Therapy Session Note  Patient Details  Name: Morgan [name] MRN: [id number] Date of Birth: 12-21-1940  Today's Date: 06/03/2014 OT Individual Time: 0800-0900 OT Individual Time Calculation (min): 60 min    Short Term Goals: Week 1:  OT Short Term Goal 1 (Week 1): Pt will initiate and complete all bathing with no more than min instructional cueing. OT Short Term Goal 2 (Week 1): Pt will keep her eyes open 75% of ADL session demonstrating sustained attention. OT Short Term Goal 3 (Week 1): Pt will perform LB bathing with min assist sit to stand. OT Short Term Goal 4 (Week 1): Pt will perform LB dressing with min assist sit to stand.  OT Short Term Goal 5 (Week 1): Pt will perform toilet transfer with min assist to elevated toilet or 3:1.   Skilled Therapeutic Interventions/Progress Updates:    Pt resting in bed eating breakfast with son present.  Pt agreeable to BADL retraining including bathing at sink level and dressing with sit<>stand from w/c at sink.  Pt declined shower this morning.  Pt continues to keep eyes closed approx 75% of time during functional tasks and transitional movements.  Pt performed all sit<>stand and transitional movements with steady A this morning. Pt continues to require assistance with bathing BLE without AE and threading pants without AE.  Pt fatigues quickly and requires multiple rest breaks throughout session.  Focus on activity tolerance, sit<>stand, functional transfers with RW, standing balance, task initiation, and safety awareness.  Therapy Documentation Precautions:  Precautions Precautions: Fall Precaution Comments: decreased sustained attention Restrictions Weight Bearing Restrictions: No Pain: Pain Assessment Pain Assessment: No/denies pain  See FIM for current functional status  Therapy/Group: Individual Therapy  Leroy Libman 06/03/2014, 9:01 AM

## 2014-06-03 NOTE — Progress Notes (Addendum)
[facility] PHYSICAL MEDICINE & REHABILITATION     PROGRESS NOTE    Subjective/Complaints: Up this morning. Still doesn't have much appetite. Continued nausea and dizziness.  Objective: Vital Signs: Blood pressure 118/71, pulse 109, temperature 98.2 F (36.8 C), temperature source Oral, resp. rate 18, SpO2 95 %. Ct Head Wo Contrast  06/02/2014   CLINICAL DATA:  Right frontal meningioma resection 05/08/2014. Altered mental status  EXAM: CT HEAD WITHOUT CONTRAST  TECHNIQUE: Contiguous axial images were obtained from the base of the skull through the vertex without intravenous contrast.  COMPARISON:  CT 05/07/2014  FINDINGS: Interval right frontal craniotomy for tumor resection. Low-density edema and encephalomalacia in the right frontal lobe from recent surgery. Small area of hyperdensity in the right frontal cortex may be a small amount of blood . This appears intra-axial and less likely is residual tumor. Intravenous contrast was not administered to evaluate for residual tumor.  Ventricle size is normal. Slight shift to the left of 3 mm. Negative for acute infarct.  IMPRESSION: Postop resection of right frontal meningioma. Small hyperdensity in the right frontal cortex may be postoperative resolving blood. No significant hemorrhage or fluid collection.  Postcontrast CT or preferably MRI would be suggested to evaluate for residual tumor if deemed clinically necessary.   Electronically Signed   By: Franchot Gallo M.D.   On: 06/02/2014 13:23   Dg Swallowing Func-speech Pathology  06/02/2014    Objective Swallowing Evaluation:    Patient Details  Name: Morgan [name] MRN: [id number] Date of Birth: April 23, 1940  Today's Date: 06/02/2014 Time: SLP Start Time (ACUTE ONLY): 1030-SLP Stop Time (ACUTE ONLY): 1115 SLP Time Calculation (min) (ACUTE ONLY): 45 min  Past Medical History:  Past Medical History  Diagnosis Date  . Diabetes mellitus   . Obesity   . Diabetes type 2, uncontrolled   . Hypoglycemia associated  with diabetes   . Edema of both legs   . Hypertension   . Combined hyperlipidemia   . Acquired autoimmune hypothyroidism   . Thyroiditis, autoimmune   . Abnormal liver function tests   . Sleep apnea, obstructive   . History of gastroesophageal reflux (GERD)   . Depression   . DM neuropathy with neurologic complication   . COPD with acute exacerbation   . Goiter   . Fatigue   . Vertigo   . Type II diabetes mellitus with peripheral angiopathy   . Gout   . Pallor    Past Surgical History:  Past Surgical History  Procedure Laterality Date  . Laparoscopic gastric banding    . Back surgery    . Craniotomy Right 05/08/2014    Procedure: CRANIOTOMY FOR MENINGIOMA;  Surgeon: Ashok Pall, MD;   Location: [facility];  Service: Neurosurgery;  Laterality: Right;  Right  Craniotomy for meningioma  . Esophagogastroduodenoscopy (egd) with propofol N/A 05/22/2014    Procedure: ESOPHAGOGASTRODUODENOSCOPY (EGD) WITH PROPOFOL;  Surgeon:  Wonda Horner, MD;  Location: [facility] END[facility];  Service: Endoscopy;   Laterality: N/A;   HPI:  HPI: 74 year old female admitted 05/03/14, who underwent Craniotomy post  Meningioma Resection. PMH significant for GERD, COPD, DM, Seizures, and  VDRF.  Patient had initial MBS on 05/23/14 and recommended Dys. 1 textures  with nectar-thick liquids and has since been upgraded to Dys. 3 textures.  Patient transferred to CIR on 05/29/14 and has been participating in  dysphagia treatment. MBS today to assess swallow function and possible  diet upgrade.   No Data Recorded  Assessment / Plan / Recommendation CHL IP CLINICAL IMPRESSIONS 06/02/2014  Dysphagia Diagnosis Moderate oral phase dysphagia;Mild pharyngeal phase  dysphagia  Clinical impression Patient demonstrates a moderate oral and mild  pharyngeal sensory and motor based dysphagia, characterized by poor bolus  formation, reduced posterior propulsion and delayed swallow initiation  across all consistencies. Patient also demonstrates a "munch like" chew  and  prolonged mastication with Dys. 3 textures. Patient has decreased base  of tongue retraction resulting in mild vallecular residue that clear with  a cued dry swallow. Patient demonstrates intermittent silent penetration  with large sips of thin liquids via straw, however, penetration was  eliminated with small, single sips via cup. Suspect patient's swallow  function is impacted by fatigue and decreased overall cognitive function.  Recommend patient continue Dys. 3 textures and upgrade to thin liquids  with full supervision. Patient and her daughter educated on current  swallowing function, diet recommendations, appropriate textures and  swallowing compensatory strategies. Both verbalized understanding of all  information but will need reinforcement.       CHL IP TREATMENT RECOMMENDATION 05/23/2014  Treatment Plan Recommendations Therapy as outlined in treatment plan below      CHL IP DIET RECOMMENDATION 06/02/2014  Diet Recommendations Dysphagia 3 (Mechanical Soft);Thin liquid  Liquid Administration via Cup;No straw  Medication Administration Whole meds with puree  Compensations Slow rate;Small sips/bites;Follow solids with  liquid;Multiple dry swallows after each bite/sip  Postural Changes and/or Swallow Maneuvers Upright 30-60 min after  meal;Seated upright 90 degrees     CHL IP OTHER RECOMMENDATIONS 06/02/2014  Recommended Consults (None)  Oral Care Recommendations Oral care BID  Other Recommendations (None)     CHL IP FOLLOW UP RECOMMENDATIONS 05/29/2014  Follow up Recommendations Inpatient Rehab     CHL IP FREQUENCY AND DURATION 05/23/2014  Speech Therapy Frequency (ACUTE ONLY) min 3x week  Treatment Duration 1 week     Pertinent Vitals/Pain N/A    SLP Swallow Goals No flowsheet data found.  No flowsheet data found.    CHL IP REASON FOR REFERRAL 06/02/2014  Reason for Referral Objectively evaluate swallowing function     CHL IP ORAL PHASE 06/02/2014  Lips (None)  Tongue (None)  Mucous membranes (None)  Nutritional  status (None)  Other (None)  Oxygen therapy (None)  Oral Phase Impaired  Oral - Pudding Teaspoon (None)  Oral - Pudding Cup (None)  Oral - Honey Teaspoon (None)  Oral - Honey Cup (None)  Oral - Honey Syringe (None)  Oral - Nectar Teaspoon (None)  Oral - Nectar Cup Piecemeal swallowing;Delayed oral transit  Oral - Nectar Straw NT  Oral - Nectar Syringe (None)  Oral - Ice Chips (None)  Oral - Thin Teaspoon Delayed oral transit;Piecemeal swallowing  Oral - Thin Cup Delayed oral transit;Piecemeal swallowing  Oral - Thin Straw Delayed oral transit;Piecemeal swallowing  Oral - Thin Syringe (None)  Oral - Puree Delayed oral transit;Piecemeal swallowing;Reduced posterior  propulsion  Oral - Mechanical Soft Delayed oral transit;Impaired mastication;Piecemeal  swallowing;Reduced posterior propulsion  Oral - Regular (None)  Oral - Multi-consistency (None)  Oral - Pill (None)  Oral Phase - Comment (None)      CHL IP PHARYNGEAL PHASE 06/02/2014  Pharyngeal Phase Impaired  Pharyngeal - Pudding Teaspoon (None)  Penetration/Aspiration details (pudding teaspoon) (None)  Pharyngeal - Pudding Cup (None)  Penetration/Aspiration details (pudding cup) (None)  Pharyngeal - Honey Teaspoon (None)  Penetration/Aspiration details (honey teaspoon) (None)  Pharyngeal - Honey Cup (None)  Penetration/Aspiration details (honey cup) (None)  Pharyngeal - Honey Syringe (None)  Penetration/Aspiration details (honey syringe) (None)  Pharyngeal - Nectar Teaspoon (None)  Penetration/Aspiration details (nectar teaspoon) (None)  Pharyngeal - Nectar Cup Delayed swallow initiation;Pharyngeal residue -  valleculae;Reduced tongue base retraction  Penetration/Aspiration details (nectar cup) (None)  Pharyngeal - Nectar Straw NT  Penetration/Aspiration details (nectar straw) (None)  Pharyngeal - Nectar Syringe (None)  Penetration/Aspiration details (nectar syringe) (None)  Pharyngeal - Ice Chips (None)  Penetration/Aspiration details (ice chips) (None)  Pharyngeal  - Thin Teaspoon Delayed swallow initiation;Reduced tongue base  retraction;Pharyngeal residue - valleculae;Compensatory strategies  attempted (Comment)  Penetration/Aspiration details (thin teaspoon) (None)  Pharyngeal - Thin Cup Delayed swallow initiation;Reduced tongue base  retraction;Pharyngeal residue - valleculae  Penetration/Aspiration details (thin cup) (None)  Pharyngeal - Thin Straw Delayed swallow initiation;Reduced tongue base  retraction;Pharyngeal residue - valleculae;Penetration/Aspiration during  swallow  Penetration/Aspiration details (thin straw) Material enters airway,  remains ABOVE vocal cords and not ejected out  Pharyngeal - Thin Syringe (None)  Penetration/Aspiration details (thin syringe') (None)  Pharyngeal - Puree Delayed swallow initiation;Pharyngeal residue -  valleculae  Penetration/Aspiration details (puree) (None)  Pharyngeal - Mechanical Soft Delayed swallow initiation;Pharyngeal residue  - valleculae  Penetration/Aspiration details (mechanical soft) (None)  Pharyngeal - Regular (None)  Penetration/Aspiration details (regular) (None)  Pharyngeal - Multi-consistency (None)  Penetration/Aspiration details (multi-consistency) (None)  Pharyngeal - Pill Delayed swallow initiation;Penetration/Aspiration during  swallow;Pharyngeal residue - valleculae  Penetration/Aspiration details (pill) Material enters airway, CONTACTS  cords and not ejected out  Pharyngeal Comment (None)     CHL IP CERVICAL ESOPHAGEAL PHASE 06/02/2014  Cervical Esophageal Phase WFL  Pudding Teaspoon (None)  Pudding Cup (None)  Honey Teaspoon (None)  Honey Cup (None)  Honey Syringe (None)  Nectar Teaspoon (None)  Nectar Cup (None)  Nectar Straw (None)  Nectar Syringe (None)  Thin Teaspoon (None)  Thin Cup (None)  Thin Straw (None)  Thin Syringe (None)  Cervical Esophageal Comment (None)    No flowsheet data found.         PAYNE, [place] 06/02/2014, 2:48 PM     Recent Labs  06/01/14 0645  WBC 6.0  HGB 9.7*  HCT  29.3*  PLT 193    Recent Labs  06/01/14 0645  NA 131*  K 4.4  CL 98  GLUCOSE 123*  BUN 12  CREATININE 1.30*  CALCIUM 8.1*   CBG (last 3)   Recent Labs  06/02/14 1657 06/02/14 2107 06/03/14 0726  GLUCAP 293* 148* 184*    Wt Readings from Last 3 Encounters:  05/19/14 100.3 kg (221 lb 1.9 oz)  01/04/12 109.09 kg (240 lb 8 oz)  09/11/11 110.496 kg (243 lb 9.6 oz)    Physical Exam:  No distressed. Sitting with eyes closed. Opens them on command  HENT: mild thrush on sides of tongue Craniotomy site well-healed. No edema. ?nystagmus laterally---keeps eyes closed  Eyes: Conjunctivae and EOM are normal. Pupils are equal, round, and reactive to light.  Pupils round and reactive to light without nystagmus  Neck: Normal range of motion. Neck supple. No JVD present. No tracheal deviation present. No thyromegaly present.  Cardiovascular: Normal rate and regular rhythm.  Respiratory: Effort normal and breath sounds normal. No respiratory distress. She has no wheezes. She has no rales.  GI: Soft. Bowel sounds are normal. She exhibits no distension. There is no tenderness. There is no rebound.  Musculoskeletal:  She moves all extremities  Lymphadenopathy:   She has no cervical adenopathy.  Neurological:  Mood is appropriate.  Oriented  to place, reason she's at hospital. f She was able to provide her name, age. UES: grossly 3-4/5 proximal to distal UE's. HF 3/5, KE 4-, ADF/APF 4+. Senses pain in all 4's. Decreased [place] on left.  Psychiatric: She has a normal mood and affect--more dynamic. Her behavior is normal.   Assessment/Plan: 1. Functional deficits secondary to right meningioma s/p resection which require 3+ hours per day of interdisciplinary therapy in a comprehensive inpatient rehab setting. Physiatrist is providing close team supervision and 24 hour management of active medical problems listed below. Physiatrist and rehab team continue to assess barriers to  discharge/monitor patient progress toward functional and medical goals. FIM: FIM - Bathing Bathing Steps Patient Completed: Chest, Right Arm, Left Arm, Abdomen, Front perineal area, Right upper leg, Left upper leg Bathing: 3: Mod-Patient completes 5-7 [id number] parts or 50-74%  FIM - Upper Body Dressing/Undressing Upper body dressing/undressing steps patient completed: Thread/unthread right sleeve of pullover shirt/dresss, Thread/unthread left sleeve of pullover shirt/dress Upper body dressing/undressing: 3: Mod-Patient completed 50-74% of tasks FIM - Lower Body Dressing/Undressing Lower body dressing/undressing steps patient completed: Thread/unthread right underwear leg, Thread/unthread right pants leg, Thread/unthread left pants leg Lower body dressing/undressing: 2: Max-Patient completed 25-49% of tasks  FIM - Toileting Toileting steps completed by patient: Performs perineal hygiene Toileting: 2: Max-Patient completed 1 of 3 steps  FIM - TAir cabin crewTransfers: 3-To toilet/BSC: Mod A (lift or lower assist), 3-From toilet/BSC: Mod A (lift or lower assist)  FIM - BControl and instrumentation engineerDevices: Arm rests, Walker, HOB elevated Bed/Chair Transfer: 4: Supine > Sit: Min A (steadying Pt. > 75%/lift 1 leg), 4: Sit > Supine: Min A (steadying pt. > 75%/lift 1 leg), 4: Bed > Chair or W/C: Min A (steadying Pt. > 75%), 4: Chair or W/C > Bed: Min A (steadying Pt. > 75%)  FIM - Locomotion: Wheelchair Distance: 50 Locomotion: Wheelchair: 2: Travels 50 - 149 ft with moderate assistance (Pt: 50 - 74%) FIM - Locomotion: Ambulation Locomotion: Ambulation Assistive Devices: WAdministratorAmbulation/Gait Assistance: 4: Min assist Locomotion: Ambulation: 2: Travels 50 - 149 ft with minimal assistance (Pt.>75%)  Comprehension Comprehension Mode: Auditory Comprehension: 4-Understands basic 75 - 89% of the time/requires cueing 10 - 24% of the  time  Expression Expression Mode: Verbal Expression: 3-Expresses basic 50 - 74% of the time/requires cueing 25 - 50% of the time. Needs to repeat parts of sentences.  Social Interaction Social Interaction: 3-Interacts appropriately 50 - 74% of the time - May be physically or verbally inappropriate.  Problem Solving Problem Solving: 3-Solves basic 50 - 74% of the time/requires cueing 25 - 49% of the time  Memory Memory: 3-Recognizes or recalls 50 - 74% of the time/requires cueing 25 - 49% of the time Medical Problem List and Plan: 1. Functional deficits secondary to right frontal meningioma status post resection 05/08/2014  -more sedated over last 18 hours--can fluctuate.   -ua equivocal, culture pending  -follow up ct with improvement  -labwork unremarkable yesterday except for the fact that her Cr is slightly elevated.   -reduce meds. THT's are normal thus far 2. DVT Prophylaxis/Anticoagulation: Subcutaneous heparin initiated 05/22/2014. Monitor for any bleeding episodes 3. Pain Management: Hydrocodone as needed. Monitor with increased mobility 4. Mood/depression: Cymbalta 60 mg twice a day. Provide emotional support 5. Neuropsych: This patient is capable of making decisions on her own behalf. 6. Skin/Wound Care: Routine skin checks 7. Fluids/Electrolytes/Nutrition/ ongoing nausea: encourage po  -RD following  -poor po intake  likely multifactorial--likely vestibular component also---vestibular evaluation  -megace trial  -IVF hs initiated---recheck bmet tomorrow 8. Seizure disorder. Keppra 500 mg 3 times a day, Vimpat 200 mg twice a day.    -consider neuro follow up if there is ongoing suspicion of absence type sz activity 9. Dysphagia. Dysphagia 3 nectar liquids. Follow speech therapy. Monitor hydration 10. Constipation with improvement, still with poor appetite/nausea-. Continue Mira lax twice a day.   -dc reglan due to somnolence---observe patterns 11. Hypertension. Clonidine  patch 0.3 mg weekly, Lasix 40 mg twice a day. Markedly increased mobility 12 hypothyroidism. Synthroid 13. History of left breast cancer. Continue arimidex 14. Diabetes mellitus and peripheral neuropathy. Hemoglobin A1c 9.4. Sliding scale insulin. Patient on Liraglutide 18 mg subcutaneously daily prior to admission.  -began nph insulin to better control sugars for now---titrate to 10u bid. 15. History of gout. Colchicine 0.6 mg twice a day resumed 16. COPD. Continue Dulera 2 puffs twice daily. No shortness of breath  LOS (Days) 5 A FACE TO FACE EVALUATION WAS PERFORMED  SWARTZ,ZACHARY T 06/03/2014 8:15 AM

## 2014-06-03 NOTE — Progress Notes (Addendum)
Inpatient Diabetes Program Recommendations  AACE/ADA: New Consensus Statement on Inpatient Glycemic Control (2013)  Target Ranges:  Prepandial:   less than 140 mg/dL      Peak postprandial:   less than 180 mg/dL (1-2 hours)      Critically ill patients:  140 - 180 mg/dL    Results for Morgan [name], Morgan [name] (MRN [id number]) as of 06/03/2014 09:28  Ref. Range 06/02/2014 06:47 06/02/2014 12:15 06/02/2014 16:57 06/02/2014 21:07 06/03/2014 07:26  Glucose-Capillary Latest Range: 70-99 mg/dL 221 (H) 220 (H) 293 (H) 148 (H) 184 (H)   Inpatient Diabetes Program Recommendations  Insulin - Basal: Noted patient's NPH was increased to 10 units BID. However, patient only recieved the pm dose of 5 units yesterday. Patient's glucose trends after the 5 units of NPH was 148, 184 mg/dl. Patient's fasting glucose looked good this am at 184mg /dl from the 5 units of NPH. Noted patient to receive NPH 10 units BID to start tonight. Patient will be without NPH today and may have elevated glucose levels. I am concerned about the NPH 10 units tonight dropping the patient too low in the am since she did well on just 5 units last pm and was 184 mg/dl this am.  Note: Spoke with RN this am about concerns. RN spoke with PA about same concerns of not getting NPH dose during the day today patient becoming hyperglycemic and then getting NPH 10 units (double the dose of last night) tonight and patient maybe at risk to have hypoglycemic episode. Patient's glucose looked good after the 5 units of NPH as written above. Orders stand as is NPH 10 units BID. Spoke to RN about checking the patients glucose more frequently tonight. Will monitor patient closely while here.  Thanks,  Tama Headings RN, MSN, [facility] Inpatient Diabetes Coordinator Team Pager [phone]

## 2014-06-03 NOTE — Progress Notes (Signed)
Speech Language Pathology Daily Session Note  Patient Details  Name: Morgan [name] MRN: [id number] Date of Birth: 01/06/1941  Today's Date: 06/03/2014 SLP Individual Time: 1100-1200 SLP Individual Time Calculation (min): 60 min  Short Term Goals: Week 1: SLP Short Term Goal 1 (Week 1): Patient will consume current diet with minimal overt s/s of aspiration with Min Averbal cues for use of swallowing strategies  SLP Short Term Goal 2 (Week 1): Patient will consume trials of thin liquids with minimal overt s/s of aspiration with Min A multimodal cues needed for use of small sips.  SLP Short Term Goal 3 (Week 1): Patient will keep her eyes open during functional tasks for 5 minutes with Max A multimodal cues.  SLP Short Term Goal 4 (Week 1): Patient will recall new, daily information with Min A multimodal cues for use of external memory aids SLP Short Term Goal 5 (Week 1): Patient will utilize an increased vocal intensity at the phrase level with supervision verbal cues.   Skilled Therapeutic Interventions: Skilled treatment session focused on cognitive and dysphagia goals. Student facilitated session by providing skilled observation of lunchtime meal of Dys. 3 textures and thin liquids, which patient consumed with minimal overt s/s of aspiration, evidenced by throat clear x2 and dry cough x3. Patient exhibited dry cough before consumption, therefore, difficult to differentiate if cough is related to dysphagia. Patient required Min A multimodal cues for patient to keep her eyes open while eating and Supervision cues for utilization of safe swallow strategies. Student further facilitated session by sitting to left of patient to facilitate attention to left field of environment and by providing Min A multimodal cues for selective attention to meal for ~2 minutes in a mildly distracting environment. Patient left sitting upright in recliner with son in room and NT notified that patient would like to  transition to bed in 10 minutes. Overall, patient appeared brighter and presented with increased social interaction with clinician and student. Continue with current plan of care.   FIM:  Comprehension Comprehension Mode: Auditory Comprehension: 4-Understands basic 75 - 89% of the time/requires cueing 10 - 24% of the time Expression Expression Mode: Verbal Expression: 4-Expresses basic 75 - 89% of the time/requires cueing 10 - 24% of the time. Needs helper to occlude trach/needs to repeat words. Social Interaction Social Interaction: 4-Interacts appropriately 75 - 89% of the time - Needs redirection for appropriate language or to initiate interaction. Problem Solving Problem Solving: 3-Solves basic 50 - 74% of the time/requires cueing 25 - 49% of the time Memory Memory: 3-Recognizes or recalls 50 - 74% of the time/requires cueing 25 - 49% of the time FIM - Eating Eating Activity: 5: Supervision/cues;5: Needs verbal cues/supervision  Pain Pain Assessment Pain Assessment: No/denies pain  Therapy/Group: Individual Therapy  Servando Snare 06/03/2014, 12:26 PM

## 2014-06-04 ENCOUNTER — Inpatient Hospital Stay (HOSPITAL_COMMUNITY): Payer: Medicare Other | Admitting: Speech Pathology

## 2014-06-04 ENCOUNTER — Encounter (HOSPITAL_COMMUNITY): Payer: Medicare Other

## 2014-06-04 ENCOUNTER — Inpatient Hospital Stay (HOSPITAL_COMMUNITY): Payer: Medicare Other

## 2014-06-04 LAB — BASIC METABOLIC PANEL
ANION GAP: 9 (ref 5–15)
BUN: 13 mg/dL (ref 6–23)
CHLORIDE: 94 mmol/L — AB (ref 96–112)
CO2: 26 mmol/L (ref 19–32)
Calcium: 8.4 mg/dL (ref 8.4–10.5)
Creatinine, Ser: 1.26 mg/dL — ABNORMAL HIGH (ref 0.50–1.10)
GFR calc non Af Amer: 41 mL/min — ABNORMAL LOW (ref >90)
GFR, EST AFRICAN AMERICAN: 48 mL/min — AB (ref >90)
Glucose, Bld: 164 mg/dL — ABNORMAL HIGH (ref 70–99)
POTASSIUM: 4 mmol/L (ref 3.5–5.1)
SODIUM: 129 mmol/L — AB (ref 135–145)

## 2014-06-04 LAB — URINE CULTURE
CULTURE: NO GROWTH
Colony Count: NO GROWTH

## 2014-06-04 LAB — GLUCOSE, CAPILLARY
GLUCOSE-CAPILLARY: 252 mg/dL — AB (ref 70–99)
Glucose-Capillary: 164 mg/dL — ABNORMAL HIGH (ref 70–99)
Glucose-Capillary: 251 mg/dL — ABNORMAL HIGH (ref 70–99)
Glucose-Capillary: 249 mg/dL — ABNORMAL HIGH (ref 70–99)

## 2014-06-04 NOTE — Progress Notes (Signed)
Inpatient Diabetes Program Recommendations  AACE/ADA: New Consensus Statement on Inpatient Glycemic Control (2013)  Target Ranges:  Prepandial:   less than 140 mg/dL      Peak postprandial:   less than 180 mg/dL (1-2 hours)      Critically ill patients:  140 - 180 mg/dL   Reviewing glucose pattern for last few days. Appears that the higher cbg's are mid-day. Pt would benefit from addition of 5 units NPH in the am as well as the present order for 10 units at HS  Thank you Rosita Kea, RN, MSN, CDE  Diabetes Inpatient Program Office: [phone] Pager: [phone] 8:00 am to 5:00 pm

## 2014-06-04 NOTE — Progress Notes (Signed)
Nursing Note: Pt gagging wretching,daughter states pt had small amount of undigested food liquid ,brown.A: Pt medicated w/ Iv zofran w/ relief.Pt also had an epiosde of feeling nauseated when taking meds last night and was bale to take all but 2.Pt okay after she stopped taking meds.

## 2014-06-04 NOTE — Progress Notes (Signed)
Occupational Therapy Note  Patient Details  Name: Morgan [name] MRN: [id number] Date of Birth: 11-30-1940  Today's Date: 06/04/2014 OT Missed Time: 41 Minutes Missed Time Reason: MD hold (comment) (verbal orders per PA)  Patient missing 60 min of skilled occupational therapy secondary to verbal orders from PA. Patient with tachycardia during PT session, therefore, pt on bedrest for now. Will follow-up as able.   Ashar Lewinski N 06/04/2014, 10:10 AM

## 2014-06-04 NOTE — Progress Notes (Signed)
Social Work Patient ID: Zollie Scale, female   DOB: 04/14/1940, 74 y.o.   MRN: [id number]  Lennart Pall, LCSW Social Worker Signed  Patient Care Conference 06/04/2014  9:21 AM    Expand All Collapse All   Inpatient RehabilitationTeam Conference and Plan of Care Update Date: 06/02/2014   Time: 2:55 PM     Patient Name: Morgan [name]       Medical Record Number: [id number]  Date of Birth: 25-Dec-1940 Sex: Female         Room/Bed: 4W20C/4W20C-01 Payor Info: Payor: MEDICARE / Plan: MEDICARE PART A AND B / Product Type: *No Product type* /    Admitting Diagnosis: Tumor resection   Admit Date/Time:  05/29/2014  4:15 PM Admission Comments: No comment available   Primary Diagnosis:  Meningioma Principal Problem: Meningioma    Patient Active Problem List     Diagnosis  Date Noted   .  DM neuropathy with neurologic complication     .  Acute respiratory failure, unspecified whether with hypoxia or hypercapnia     .  Hyponatremia     .  Peripheral neuropathy     .  Essential hypertension     .  Gastroesophageal reflux disease without esophagitis     .  Meningioma     .  Acute urinary retention     .  HLD (hyperlipidemia)     .  Other emphysema     .  Other specified hypothyroidism     .  Primary gout     .  Brain mass  05/03/2014   .  Acute encephalopathy  05/03/2014   .  Left-sided weakness  05/03/2014   .  Aphasia  05/03/2014   .  Abnormal head CT     .  Left hemiparesis     .  Hypothyroidism, acquired, autoimmune  01/04/2012   .  Peripheral angiopathy due to secondary diabetes  01/04/2012   .  Diabetes type 2, uncontrolled     .  Hypoglycemia associated with diabetes     .  Edema of both legs     .  Combined hyperlipidemia     .  Acquired autoimmune hypothyroidism     .  Thyroiditis, autoimmune     .  Abnormal liver function tests     .  Sleep apnea, obstructive     .  History of gastroesophageal reflux (GERD)     .  Depression     .  Type 2 diabetes mellitus with  polyneuropathy     .  COPD with acute exacerbation     .  Goiter     .  Fatigue     .  Vertigo     .  Type II diabetes mellitus with peripheral angiopathy     .  Gout     .  Pallor     .  Type II or unspecified type diabetes mellitus without mention of complication, uncontrolled  08/01/2010   .  Hypertension  08/01/2010   .  Mixed hyperlipidemia  08/01/2010   .  Obesity  08/01/2010     Expected Discharge Date: Expected Discharge Date:  (SNF)  Team Members Present: Physician leading conference: Dr. Alger Simons Social Worker Present: Lennart Pall, LCSW Nurse Present: Elliot Cousin, RN PT Present: Raylene Everts, PT;Bridgett Ripa, Cottie Banda, PT OT Present: Gareth Morgan, OT SLP Present: Weston Anna, SLP PPS Coordinator present : Daiva Nakayama, RN, [place]  Current Status/Progress  Goal  Weekly Team Focus   Medical     mengioma s/p resection, ileus, prolonged recovery.   improve po intake, increase activity   reduce lethargy. adjust meds, follow up ct   Bowel/Bladder     patient is currrently continent of bowel and bladder with min assist  remain continent of bowela nd bladder   offer toileting q2h and prn   Swallow/Nutrition/ Hydration     Dys. 3 textures with thin liquids, Full supervision   Supervision  Tolerance of diet upgrade, utilization of swallowing compensatory strategies   ADL's     functional transfers-mod A; bathing-mod A; UB dressing-supervision; LB dressing-max A; toileting-max A; decreased activity tolerance   supervision overall  activity tolerance, funcitonal transfers; standing balance, BADLs, safety awareness   Mobility     Pt is currently, supervision with bed mobility and sitting balance, minA for stand pivot transfers and ambulation with RW; limited tolerance due to dizziness  Overall supervision for transfers and mobility, minA for stairs   Functional transfers and ambulation, balance, activity tolerance and endurance, strength, patient and  family education, discharge planning    Communication               Safety/Cognition/ Behavioral Observations    Max A  Supervision  maintain her eyes open during session, attention, initiation    Pain     patient currently denies pain  maintain pain at 3 or less on a scale of 0-10   assess for pain q4h and prn   Skin     no current ling/symptom of skin breakdown, some bruising to bilateral arms and abdomen from injections  no new skin injury/breakdown  assess skin q shift and prn    Rehab Goals Patient on target to meet rehab goals: No Rehab Goals Revised: limited engagement so far *See Care Plan and progress notes for long and short-term goals.    Barriers to Discharge:  magnitude of deficits, ongoing lethargy, poor intake      Possible Resolutions to Barriers:   improve po intake, family ed, NMR, med adjustment      Discharge Planning/Teaching Needs:   Home with family vs change of plan to SNF - still under discussion with family       Team Discussion:    More sedated yesterday;  CT recheck today and await UA.  Ileus resolving.  MBS today and cleared for thin liquids.  Pt c/o dizziness - vestibular eval to be done tomorrow.  May be medically ready to transfer to SNF next week.   Revisions to Treatment Plan:    None    Continued Need for Acute Rehabilitation Level of Care: The patient requires daily medical management by a physician with specialized training in physical medicine and rehabilitation for the following conditions: Daily direction of a multidisciplinary physical rehabilitation program to ensure safe treatment while eliciting the highest outcome that is of practical value to the patient.: Yes Daily medical management of patient stability for increased activity during participation in an intensive rehabilitation regime.: Yes Daily analysis of laboratory values and/or radiology reports with any subsequent need for medication adjustment of medical intervention for : Post  surgical problems;Pulmonary problems  Kailly Richoux 06/04/2014, 9:23 AM

## 2014-06-04 NOTE — Plan of Care (Signed)
Problem: RH KNOWLEDGE DEFICIT Goal: RH STG INCREASE KNOWLEDGE OF HYPERTENSION Patient will verbalize signs and symptoms of hyper/hypotension by discharge with min assistance  Outcome: Progressing Pt's family Goal: RH STG INCREASE KNOWLEDGE OF DYSPHAGIA/FLUID INTAKE Outcome: Progressing Pt's family

## 2014-06-04 NOTE — Progress Notes (Signed)
Speech Language Pathology Daily Session Note  Patient Details  Name: Morgan [name] MRN: [id number] Date of Birth: [birth year]  Today's Date: 06/04/2014 SLP Individual Time: 1300-1400 SLP Individual Time Calculation (min): 60 min  Short Term Goals: Week 1: SLP Short Term Goal 1 (Week 1): Patient will consume current diet with minimal overt s/s of aspiration with Min Averbal cues for use of swallowing strategies  SLP Short Term Goal 2 (Week 1): Patient will consume trials of thin liquids with minimal overt s/s of aspiration with Min A multimodal cues needed for use of small sips.  SLP Short Term Goal 3 (Week 1): Patient will keep her eyes open during functional tasks for 5 minutes with Max A multimodal cues.  SLP Short Term Goal 4 (Week 1): Patient will recall new, daily information with Min A multimodal cues for use of external memory aids SLP Short Term Goal 5 (Week 1): Patient will utilize an increased vocal intensity at the phrase level with supervision verbal cues.   Skilled Therapeutic Interventions: Skilled treatment session focused on cognitive and dysphagia goals. Upon arrival, patient was supine in bed and alert, with daughter present. Patient elevated in bed to maximize arousal and safe consumption of lunchtime meal of Dys. 3 textures and thin liquids. Student facilitated session by providing skilled observation and providing Min A multimodal cues for patient use of safe swallow strategies. Patient with intermittent s/s of aspiration characterized by immediate throat clear x1, immediate cough x3, and delayed cough x1. Suspect that larger boluses and fatigue may have contributed to overt s/s of aspiration, but difficult to differentiate coughing related to dysphagia from coughing related to intermittent dry cough which occurs regardless of consumption status. Patient required Supervision A multimodal cues for alternating attention in a quiet environment for ~10-15 minutes with self-feeding  and a functional conversation, and Mod A multimodal cues for keeping eyes open during mealtime. During session, patient requested that SLP check her HR; SpO2: 99%, HR: 114. HR checked again later in session and remained 114; RN made aware. Patient left reclined in bed with 3 siderails raised, all needs within reach, and with daughter present. Continue with current plan of care.   FIM:  Comprehension Comprehension Mode: Auditory Comprehension: 4-Understands basic 75 - 89% of the time/requires cueing 10 - 24% of the time Expression Expression Mode: Verbal Expression: 4-Expresses basic 75 - 89% of the time/requires cueing 10 - 24% of the time. Needs helper to occlude trach/needs to repeat words. Social Interaction Social Interaction: 4-Interacts appropriately 75 - 89% of the time - Needs redirection for appropriate language or to initiate interaction. Problem Solving Problem Solving: 3-Solves basic 50 - 74% of the time/requires cueing 25 - 49% of the time Memory Memory: 3-Recognizes or recalls 50 - 74% of the time/requires cueing 25 - 49% of the time FIM - Eating Eating Activity: 5: Supervision/cues;5: Needs verbal cues/supervision  Pain Pain Assessment Pain Assessment: No/denies pain  Therapy/Group: Individual Therapy  Servando Snare 06/04/2014, 3:23 PM

## 2014-06-04 NOTE — Progress Notes (Signed)
[facility] PHYSICAL MEDICINE & REHABILITATION     PROGRESS NOTE    Subjective/Complaints: Went over CT results with daughter looks like normal post op findings Objective: Vital Signs: Blood pressure 137/85, pulse 114, temperature 97.8 F (36.6 C), temperature source Oral, resp. rate 18, SpO2 96 %. Ct Head Wo Contrast  06/02/2014   CLINICAL DATA:  Right frontal meningioma resection 05/08/2014. Altered mental status  EXAM: CT HEAD WITHOUT CONTRAST  TECHNIQUE: Contiguous axial images were obtained from the base of the skull through the vertex without intravenous contrast.  COMPARISON:  CT 05/07/2014  FINDINGS: Interval right frontal craniotomy for tumor resection. Low-density edema and encephalomalacia in the right frontal lobe from recent surgery. Small area of hyperdensity in the right frontal cortex may be a small amount of blood . This appears intra-axial and less likely is residual tumor. Intravenous contrast was not administered to evaluate for residual tumor.  Ventricle size is normal. Slight shift to the left of 3 mm. Negative for acute infarct.  IMPRESSION: Postop resection of right frontal meningioma. Small hyperdensity in the right frontal cortex may be postoperative resolving blood. No significant hemorrhage or fluid collection.  Postcontrast CT or preferably MRI would be suggested to evaluate for residual tumor if deemed clinically necessary.   Electronically Signed   By: Franchot Gallo M.D.   On: 06/02/2014 13:23   Dg Swallowing Func-speech Pathology  06/02/2014    Objective Swallowing Evaluation:    Patient Details  Name: Morgan [name] MRN: [id number] Date of Birth: 1941/01/31  Today's Date: 06/02/2014 Time: SLP Start Time (ACUTE ONLY): 1030-SLP Stop Time (ACUTE ONLY): 1115 SLP Time Calculation (min) (ACUTE ONLY): 45 min  Past Medical History:  Past Medical History  Diagnosis Date  . Diabetes mellitus   . Obesity   . Diabetes type 2, uncontrolled   . Hypoglycemia associated with diabetes    . Edema of both legs   . Hypertension   . Combined hyperlipidemia   . Acquired autoimmune hypothyroidism   . Thyroiditis, autoimmune   . Abnormal liver function tests   . Sleep apnea, obstructive   . History of gastroesophageal reflux (GERD)   . Depression   . DM neuropathy with neurologic complication   . COPD with acute exacerbation   . Goiter   . Fatigue   . Vertigo   . Type II diabetes mellitus with peripheral angiopathy   . Gout   . Pallor    Past Surgical History:  Past Surgical History  Procedure Laterality Date  . Laparoscopic gastric banding    . Back surgery    . Craniotomy Right 05/08/2014    Procedure: CRANIOTOMY FOR MENINGIOMA;  Surgeon: Ashok Pall, MD;   Location: [facility];  Service: Neurosurgery;  Laterality: Right;  Right  Craniotomy for meningioma  . Esophagogastroduodenoscopy (egd) with propofol N/A 05/22/2014    Procedure: ESOPHAGOGASTRODUODENOSCOPY (EGD) WITH PROPOFOL;  Surgeon:  Wonda Horner, MD;  Location: [facility] ENDOSCOPY;  Service: Endoscopy;   Laterality: N/A;   HPI:  HPI: 74 year old female admitted 05/03/14, who underwent Craniotomy post  Meningioma Resection. PMH significant for GERD, COPD, DM, Seizures, and  VDRF.  Patient had initial MBS on 05/23/14 and recommended Dys. 1 textures  with nectar-thick liquids and has since been upgraded to Dys. 3 textures.  Patient transferred to CIR on 05/29/14 and has been participating in  dysphagia treatment. MBS today to assess swallow function and possible  diet upgrade.   No Data Recorded  Assessment /  Plan / Recommendation CHL IP CLINICAL IMPRESSIONS 06/02/2014  Dysphagia Diagnosis Moderate oral phase dysphagia;Mild pharyngeal phase  dysphagia  Clinical impression Patient demonstrates a moderate oral and mild  pharyngeal sensory and motor based dysphagia, characterized by poor bolus  formation, reduced posterior propulsion and delayed swallow initiation  across all consistencies. Patient also demonstrates a "munch like" chew  and prolonged  mastication with Dys. 3 textures. Patient has decreased base  of tongue retraction resulting in mild vallecular residue that clear with  a cued dry swallow. Patient demonstrates intermittent silent penetration  with large sips of thin liquids via straw, however, penetration was  eliminated with small, single sips via cup. Suspect patient's swallow  function is impacted by fatigue and decreased overall cognitive function.  Recommend patient continue Dys. 3 textures and upgrade to thin liquids  with full supervision. Patient and her daughter educated on current  swallowing function, diet recommendations, appropriate textures and  swallowing compensatory strategies. Both verbalized understanding of all  information but will need reinforcement.       CHL IP TREATMENT RECOMMENDATION 05/23/2014  Treatment Plan Recommendations Therapy as outlined in treatment plan below      CHL IP DIET RECOMMENDATION 06/02/2014  Diet Recommendations Dysphagia 3 (Mechanical Soft);Thin liquid  Liquid Administration via Cup;No straw  Medication Administration Whole meds with puree  Compensations Slow rate;Small sips/bites;Follow solids with  liquid;Multiple dry swallows after each bite/sip  Postural Changes and/or Swallow Maneuvers Upright 30-60 min after  meal;Seated upright 90 degrees     CHL IP OTHER RECOMMENDATIONS 06/02/2014  Recommended Consults (None)  Oral Care Recommendations Oral care BID  Other Recommendations (None)     CHL IP FOLLOW UP RECOMMENDATIONS 05/29/2014  Follow up Recommendations Inpatient Rehab     CHL IP FREQUENCY AND DURATION 05/23/2014  Speech Therapy Frequency (ACUTE ONLY) min 3x week  Treatment Duration 1 week     Pertinent Vitals/Pain N/A    SLP Swallow Goals No flowsheet data found.  No flowsheet data found.    CHL IP REASON FOR REFERRAL 06/02/2014  Reason for Referral Objectively evaluate swallowing function     CHL IP ORAL PHASE 06/02/2014  Lips (None)  Tongue (None)  Mucous membranes (None)  Nutritional status  (None)  Other (None)  Oxygen therapy (None)  Oral Phase Impaired  Oral - Pudding Teaspoon (None)  Oral - Pudding Cup (None)  Oral - Honey Teaspoon (None)  Oral - Honey Cup (None)  Oral - Honey Syringe (None)  Oral - Nectar Teaspoon (None)  Oral - Nectar Cup Piecemeal swallowing;Delayed oral transit  Oral - Nectar Straw NT  Oral - Nectar Syringe (None)  Oral - Ice Chips (None)  Oral - Thin Teaspoon Delayed oral transit;Piecemeal swallowing  Oral - Thin Cup Delayed oral transit;Piecemeal swallowing  Oral - Thin Straw Delayed oral transit;Piecemeal swallowing  Oral - Thin Syringe (None)  Oral - Puree Delayed oral transit;Piecemeal swallowing;Reduced posterior  propulsion  Oral - Mechanical Soft Delayed oral transit;Impaired mastication;Piecemeal  swallowing;Reduced posterior propulsion  Oral - Regular (None)  Oral - Multi-consistency (None)  Oral - Pill (None)  Oral Phase - Comment (None)      CHL IP PHARYNGEAL PHASE 06/02/2014  Pharyngeal Phase Impaired  Pharyngeal - Pudding Teaspoon (None)  Penetration/Aspiration details (pudding teaspoon) (None)  Pharyngeal - Pudding Cup (None)  Penetration/Aspiration details (pudding cup) (None)  Pharyngeal - Honey Teaspoon (None)  Penetration/Aspiration details (honey teaspoon) (None)  Pharyngeal - Honey Cup (None)  Penetration/Aspiration details (honey cup) (None)  Pharyngeal -  Honey Syringe (None)  Penetration/Aspiration details (honey syringe) (None)  Pharyngeal - Nectar Teaspoon (None)  Penetration/Aspiration details (nectar teaspoon) (None)  Pharyngeal - Nectar Cup Delayed swallow initiation;Pharyngeal residue -  valleculae;Reduced tongue base retraction  Penetration/Aspiration details (nectar cup) (None)  Pharyngeal - Nectar Straw NT  Penetration/Aspiration details (nectar straw) (None)  Pharyngeal - Nectar Syringe (None)  Penetration/Aspiration details (nectar syringe) (None)  Pharyngeal - Ice Chips (None)  Penetration/Aspiration details (ice chips) (None)  Pharyngeal - Thin  Teaspoon Delayed swallow initiation;Reduced tongue base  retraction;Pharyngeal residue - valleculae;Compensatory strategies  attempted (Comment)  Penetration/Aspiration details (thin teaspoon) (None)  Pharyngeal - Thin Cup Delayed swallow initiation;Reduced tongue base  retraction;Pharyngeal residue - valleculae  Penetration/Aspiration details (thin cup) (None)  Pharyngeal - Thin Straw Delayed swallow initiation;Reduced tongue base  retraction;Pharyngeal residue - valleculae;Penetration/Aspiration during  swallow  Penetration/Aspiration details (thin straw) Material enters airway,  remains ABOVE vocal cords and not ejected out  Pharyngeal - Thin Syringe (None)  Penetration/Aspiration details (thin syringe') (None)  Pharyngeal - Puree Delayed swallow initiation;Pharyngeal residue -  valleculae  Penetration/Aspiration details (puree) (None)  Pharyngeal - Mechanical Soft Delayed swallow initiation;Pharyngeal residue  - valleculae  Penetration/Aspiration details (mechanical soft) (None)  Pharyngeal - Regular (None)  Penetration/Aspiration details (regular) (None)  Pharyngeal - Multi-consistency (None)  Penetration/Aspiration details (multi-consistency) (None)  Pharyngeal - Pill Delayed swallow initiation;Penetration/Aspiration during  swallow;Pharyngeal residue - valleculae  Penetration/Aspiration details (pill) Material enters airway, CONTACTS  cords and not ejected out  Pharyngeal Comment (None)     CHL IP CERVICAL ESOPHAGEAL PHASE 06/02/2014  Cervical Esophageal Phase WFL  Pudding Teaspoon (None)  Pudding Cup (None)  Honey Teaspoon (None)  Honey Cup (None)  Honey Syringe (None)  Nectar Teaspoon (None)  Nectar Cup (None)  Nectar Straw (None)  Nectar Syringe (None)  Thin Teaspoon (None)  Thin Cup (None)  Thin Straw (None)  Thin Syringe (None)  Cervical Esophageal Comment (None)    No flowsheet data found.         PAYNE, [place] 06/02/2014, 2:48 PM    No results for input(s): WBC, HGB, HCT, PLT in the last 72  hours.  Recent Labs  06/03/14 0705 06/04/14 0535  NA 131* 129*  K 4.8 4.0  CL 93* 94*  GLUCOSE 190* 164*  BUN 14 13  CREATININE 1.36* 1.26*  CALCIUM 9.0 8.4   CBG (last 3)   Recent Labs  06/03/14 1636 06/03/14 2113 06/04/14 0841  GLUCAP 152* 187* 164*    Wt Readings from Last 3 Encounters:  05/19/14 100.3 kg (221 lb 1.9 oz)  01/04/12 109.09 kg (240 lb 8 oz)  09/11/11 110.496 kg (243 lb 9.6 oz)    Physical Exam:  No distressed. Sitting with eyes closed. Opens them on command  HENT: mild thrush on sides of tongue Craniotomy site well-healed. No edema. ?nystagmus laterally---keeps eyes closed  Eyes: Conjunctivae and EOM are normal. Pupils are equal, round, and reactive to light.  Pupils round and reactive to light without nystagmus  Neck: Normal range of motion. Neck supple. No JVD present. No tracheal deviation present. No thyromegaly present.  Cardiovascular: Normal rate and regular rhythm.  Respiratory: Effort normal and breath sounds normal. No respiratory distress. She has no wheezes. She has no rales.  GI: Soft. Bowel sounds are normal. She exhibits no distension. There is no tenderness. There is no rebound.  Musculoskeletal:  She moves all extremities  Lymphadenopathy:   She has no cervical adenopathy.  Neurological:  Mood is appropriate.  Oriented  to place, reason she's at hospital. f She was able to provide her name, age. UES: grossly 3-4/5 proximal to distal UE's. HF 3/5, KE 4-, ADF/APF 4+. Senses pain in all 4's. Decreased [place] on left.  Psychiatric: She has a normal mood and affect--more dynamic. Her behavior is normal.   Assessment/Plan: 1. Functional deficits secondary to right meningioma s/p resection which require 3+ hours per day of interdisciplinary therapy in a comprehensive inpatient rehab setting. Physiatrist is providing close team supervision and 24 hour management of active medical problems listed below. Physiatrist and rehab team continue to  assess barriers to discharge/monitor patient progress toward functional and medical goals. FIM: FIM - Bathing Bathing Steps Patient Completed: Chest, Right Arm, Left Arm, Abdomen, Front perineal area, Right upper leg, Left upper leg Bathing: 3: Mod-Patient completes 5-7 [id number] parts or 50-74%  FIM - Upper Body Dressing/Undressing Upper body dressing/undressing steps patient completed: Thread/unthread right sleeve of pullover shirt/dresss, Thread/unthread left sleeve of pullover shirt/dress, Put head through opening of pull over shirt/dress Upper body dressing/undressing: 4: Min-Patient completed 75 plus % of tasks FIM - Lower Body Dressing/Undressing Lower body dressing/undressing steps patient completed: Pull underwear up/down, Thread/unthread left underwear leg, Thread/unthread right pants leg, Thread/unthread left pants leg Lower body dressing/undressing: 2: Max-Patient completed 25-49% of tasks  FIM - Toileting Toileting steps completed by patient: Performs perineal hygiene Toileting: 2: Max-Patient completed 1 of 3 steps  FIM - TRadio producerDevices: Bedside commode, WInsurance account managerTransfers: 4-From toilet/BSC: Min A (steadying Pt. > 75%), 4-To toilet/BSC: Min A (steadying Pt. > 75%)  FIM - Bed/Chair Transfer Bed/Chair Transfer Assistive Devices: Bed rails, Walker, Arm rests Bed/Chair Transfer: 0: Activity did not occur  FIM - Locomotion: Wheelchair Distance: 50 Locomotion: Wheelchair: 0: Activity did not occur FIM - Locomotion: Ambulation Locomotion: Ambulation Assistive Devices: WAdministratorAmbulation/Gait Assistance: Not tested (comment) Locomotion: Ambulation: 0: Activity did not occur  Comprehension Comprehension Mode: Auditory Comprehension: 4-Understands basic 75 - 89% of the time/requires cueing 10 - 24% of the time  Expression Expression Mode: Verbal Expression: 4-Expresses basic 75 - 89% of the time/requires cueing 10 - 24% of the  time. Needs helper to occlude trach/needs to repeat words.  Social Interaction Social Interaction: 4-Interacts appropriately 75 - 89% of the time - Needs redirection for appropriate language or to initiate interaction.  Problem Solving Problem Solving: 3-Solves basic 50 - 74% of the time/requires cueing 25 - 49% of the time  Memory Memory: 3-Recognizes or recalls 50 - 74% of the time/requires cueing 25 - 49% of the time Medical Problem List and Plan: 1. Functional deficits secondary to right frontal meningioma status post resection 05/08/2014  -more sedated over last 18 hours--can fluctuate.   -ua equivocal, culture pending  -follow up ct with improvement  -labwork unremarkable yesterday except for the fact that her Cr is slightly elevated.   -reduce meds. THT's are normal thus far 2. DVT Prophylaxis/Anticoagulation: Subcutaneous heparin initiated 05/22/2014. Monitor for any bleeding episodes 3. Pain Management: Hydrocodone as needed. Monitor with increased mobility 4. Mood/depression: Cymbalta 60 mg twice a day. Provide emotional support 5. Neuropsych: This patient is capable of making decisions on her own behalf. 6. Skin/Wound Care: Routine skin checks 7. Fluids/Electrolytes/Nutrition/ ongoing nausea: encourage po  -RD following  -poor po intake likely multifactorial--likely vestibular component also---vestibular evaluation  -megace trial  -IVF hs initiated---recheck bmet tomorrow 8. Seizure disorder. Keppra 500 mg 3 times a day, Vimpat 200 mg twice a day.    -  consider neuro follow up if there is ongoing suspicion of absence type sz activity 9. Dysphagia. Dysphagia 3 nectar liquids. Follow speech therapy. Monitor hydration 10. Constipation with improvement, still with poor appetite/nausea-. Continue Mira lax twice a day.   -dc reglan due to somnolence---observe patterns 11. Hypertension. Clonidine patch 0.3 mg weekly, Lasix 40 mg twice a day. Markedly increased mobility 12  hypothyroidism. Synthroid 13. History of left breast cancer. Continue arimidex 14. Diabetes mellitus and peripheral neuropathy. Hemoglobin A1c 9.4. Sliding scale insulin. Patient on Liraglutide 18 mg subcutaneously daily prior to admission.  -began nph insulin to better control sugars for now---titrate to 10u bid. 15. History of gout. Colchicine 0.6 mg twice a day resumed 16. COPD. Continue Dulera 2 puffs twice daily. No shortness of breath  LOS (Days) 6 A FACE TO FACE EVALUATION WAS PERFORMED  [name] 06/04/2014 8:59 AM

## 2014-06-04 NOTE — Patient Care Conference (Signed)
Inpatient RehabilitationTeam Conference and Plan of Care Update Date: 06/02/2014   Time: 2:55 PM    Patient Name: Morgan [name]      Medical Record Number: [id number]  Date of Birth: [birth year] Sex: Female         Room/Bed: 4W20C/4W20C-01 Payor Info: Payor: MEDICARE / Plan: MEDICARE PART A AND B / Product Type: *No Product type* /    Admitting Diagnosis: Tumor resection  Admit Date/Time:  05/29/2014  4:15 PM Admission Comments: No comment available   Primary Diagnosis:  Meningioma Principal Problem: Meningioma  Patient Active Problem List   Diagnosis Date Noted  . DM neuropathy with neurologic complication   . Acute respiratory failure, unspecified whether with hypoxia or hypercapnia   . Hyponatremia   . Peripheral neuropathy   . Essential hypertension   . Gastroesophageal reflux disease without esophagitis   . Meningioma   . Acute urinary retention   . HLD (hyperlipidemia)   . Other emphysema   . Other specified hypothyroidism   . Primary gout   . Brain mass 05/03/2014  . Acute encephalopathy 05/03/2014  . Left-sided weakness 05/03/2014  . Aphasia 05/03/2014  . Abnormal head CT   . Left hemiparesis   . 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 Peripheral angiopathy due to secondary diabetes 01/04/2012  . Diabetes type 2, uncontrolled   . Hypoglycemia associated with diabetes   . Edema of both legs   . Combined hyperlipidemia   . Acquired autoimmune hypothyroidism   . Thyroiditis, autoimmune   . Abnormal liver function tests   . Sleep apnea, obstructive   . History of gastroesophageal reflux (GERD)   . Depression   . Type 2 diabetes mellitus with polyneuropathy   . COPD with acute exacerbation   . Goiter   . Fatigue   . Vertigo   . Type II diabetes mellitus with peripheral angiopathy   . Gout   . Pallor   . Type II or unspecified type diabetes mellitus without mention of complication, uncontrolled 08/01/2010  . Hypertension 08/01/2010  . Mixed  hyperlipidemia 08/01/2010  . Obesity 08/01/2010    Expected Discharge Date: Expected Discharge Date:  (SNF)  Team Members Present: Physician leading conference: Dr. Alger Simons Social Worker Present: Lennart Pall, LCSW Nurse Present: Elliot Cousin, RN PT Present: Raylene Everts, PT;Bridgett Ripa, Cottie Banda, PT OT Present: Gareth Morgan, OT SLP Present: Weston Anna, SLP PPS Coordinator present : Daiva Nakayama, RN, CRRN     Current Status/Progress Goal Weekly Team Focus  Medical   mengioma s/p resection, ileus, prolonged recovery.   improve po intake, increase activity  reduce lethargy. adjust meds, follow up ct   Bowel/Bladder   patient is currrently continent of bowel and bladder with min assist  remain continent of bowela nd bladder  offer toileting q2h and prn   Swallow/Nutrition/ Hydration   Dys. 3 textures with thin liquids, Full supervision   Supervision  Tolerance of diet upgrade, utilization of swallowing compensatory strategies   ADL's   functional transfers-mod A; bathing-mod A; UB dressing-supervision; LB dressing-max A; toileting-max A; decreased activity tolerance  supervision overall  activity tolerance, funcitonal transfers; standing balance, BADLs, safety awareness   Mobility   Pt is currently, supervision with bed mobility and sitting balance, minA for stand pivot transfers and ambulation with RW; limited tolerance due to dizziness  Overall supervision for transfers and mobility, minA for stairs  Functional transfers and ambulation, balance, activity tolerance and endurance, strength, patient and family education, discharge planning  Communication             Safety/Cognition/ Behavioral Observations  Max A  Supervision  maintain her eyes open during session, attention, initiation    Pain   patient currently denies pain  maintain pain at 3 or less on a scale of 0-10   assess for pain q4h and prn   Skin   no current ling/symptom of skin breakdown, some  bruising to bilateral arms and abdomen from injections  no new skin injury/breakdown  assess skin q shift and prn    Rehab Goals Patient on target to meet rehab goals: No Rehab Goals Revised: limited engagement so far *See Care Plan and progress notes for long and short-term goals.  Barriers to Discharge: magnitude of deficits, ongoing lethargy, poor intake    Possible Resolutions to Barriers:  improve po intake, family ed, NMR, med adjustment    Discharge Planning/Teaching Needs:  Home with family vs change of plan to SNF - still under discussion with family      Team Discussion:  More sedated yesterday;  CT recheck today and await UA.  Ileus resolving.  MBS today and cleared for thin liquids.  Pt c/o dizziness - vestibular eval to be done tomorrow.  May be medically ready to transfer to SNF next week.  Revisions to Treatment Plan:  None   Continued Need for Acute Rehabilitation Level of Care: The patient requires daily medical management by a physician with specialized training in physical medicine and rehabilitation for the following conditions: Daily direction of a multidisciplinary physical rehabilitation program to ensure safe treatment while eliciting the highest outcome that is of practical value to the patient.: Yes Daily medical management of patient stability for increased activity during participation in an intensive rehabilitation regime.: Yes Daily analysis of laboratory values and/or radiology reports with any subsequent need for medication adjustment of medical intervention for : Post surgical problems;Pulmonary problems  Raghav Verrilli 06/04/2014, 9:23 AM

## 2014-06-04 NOTE — Progress Notes (Signed)
Physical Therapy Session Note  Patient Details  Name: Morgan [name] MRN: [id number] Date of Birth: March 25, 1941  Today's Date: 06/04/2014 PT Individual Time: 0900-0940 PT Individual Time Calculation (min): 40 min   Short Term Goals: Week 1:  PT Short Term Goal 1 (Week 1): Pt will increase transfers supine to edge of bed. edge of bed to supine with S.  PT Short Term Goal 2 (Week 1): Pt will increase transfers bed to chair, chair to bed with LRAD to min A.  PT Short Term Goal 3 (Week 1): Pt will increase ambulation with LRAD about 50 feet with min A.  PT Short Term Goal 4 (Week 1): Pt will propel w/c with B LEs about 100 feet with S.  PT Short Term Goal 5 (Week 1): Pt will ascend/descend 4 stairs with B rails and min A.   Skilled Therapeutic Interventions/Progress Updates:  1:1. Pt received semi-reclined in bed, reported feeling very fatigued but agreeable to participate as able with min encouragement. Focus this session on activity tolerance as well as functional transfers and mobility with attempt to initiate visual targeting as able. Pt req overall min A for t/f sup<>sit EOB with use of hospital bed functions as well as all t/f sit<>stand and SPT with RW. Pt req consistent max cues to implement visual targeting strategies with verbal reports of slightly decreased dizziness with use. Pt with overall fair tolerance to w/c propulsion 75'x1 with B UE and initial bout of ambulation 20'x1 with RW, overall min A. Following ambulation, pt reporting need to sit 2/2 fatigue, not dizziness.   During second bout of ambulation 20'x1, noted significantly increased facial erythema and progressively increasing SOB. Pt immediately assisted to sitting and vitals assessed. SaO2: 99% on RA, but HR: 202, unable to obtain manual pulse in various places due to faintness. RN immediately made aware and present to assess. Pt req approx. 64min for HR to decrease to 120. Trial 1x stand, HR elevating to >150 within 10 seconds  of standing. Formal tx session ended due to vitals contraindicating appropriate and safe participation in therapy.   Pt transported back to room and assisted to sitting EOB again, vitals reassessed with RN present. BP: 161/131, SaO2: 99% and HR 119. Pt assisted to semi-reclined position in bed with all needs in reach, RN in room and daughter present at side.   PA-C immediately notified of vitals in response to activity.     Therapy Documentation Precautions:  Precautions Precautions: Fall Precaution Comments: decreased sustained attention Restrictions Weight Bearing Restrictions: Yes General: PT Amount of Missed Time (min): 20 Minutes PT Missed Treatment Reason: Other (Comment) (Elevated vitals, RN aware and present) Vital Signs: Therapy Vitals Pulse Rate: (!) 116 BP: (!) 147/93 mmHg Patient Position (if appropriate): Lying Oxygen Therapy SpO2: 97 % O2 Device: Not Delivered  See FIM for current functional status  Therapy/Group: Individual Therapy  [name] 06/04/2014, 10:30 AM

## 2014-06-04 NOTE — Progress Notes (Signed)
Social Work Patient ID: Zollie Scale, female   DOB: [birth year], 74 y.o.   MRN: [id number]   Met with pt and daughter following team conference on Tuesday afternoon and again with son yesterday.  All aware that MD did not feel pt was medically ready for SNF transfer at the time of team conference but may be ready by beginning of next week.  Children and pt report they have decided they would like to d/c to SNF closer to home as soon as medically cleared as this would decrease time and travel burden on family.  Team aware.  Eliud Polo, LCSW

## 2014-06-05 ENCOUNTER — Inpatient Hospital Stay (HOSPITAL_COMMUNITY): Payer: Medicare Other

## 2014-06-05 ENCOUNTER — Inpatient Hospital Stay (HOSPITAL_COMMUNITY): Payer: Medicare Other | Admitting: Speech Pathology

## 2014-06-05 LAB — GLUCOSE, CAPILLARY
GLUCOSE-CAPILLARY: 219 mg/dL — AB (ref 70–99)
GLUCOSE-CAPILLARY: 155 mg/dL — AB (ref 70–99)
Glucose-Capillary: 176 mg/dL — ABNORMAL HIGH (ref 70–99)
Glucose-Capillary: 125 mg/dL — ABNORMAL HIGH (ref 70–99)

## 2014-06-05 NOTE — Progress Notes (Signed)
Pt. Still having very poor appetite,still getting nauseated when taking her medicines with pudding or apple sauce.RN spoke to diabetes coordinator about insulin coverage and the poor intake.RN explained to family that Pt. Still need to get insulin  in order to keep the blood sugar levels stable.Pt.'s son verbalized his understanding.Keep assessing pt. And family needs.

## 2014-06-05 NOTE — Progress Notes (Signed)
PHYSICAL MEDICINE & REHABILITATION     PROGRESS NOTE    Subjective/Complaints:  tolerating OT at bedside, no CP or SOB Objective: Vital Signs: Blood pressure 150/58, pulse 110, temperature 97.8 F (36.6 C), temperature source Oral, resp. rate 18, SpO2 96 %. No results found. No results for input(s): WBC, HGB, HCT, PLT in the last 72 hours.  Recent Labs  06/03/14 0705 06/04/14 0535  NA 131* 129*  K 4.8 4.0  CL 93* 94*  GLUCOSE 190* 164*  BUN 14 13  CREATININE 1.36* 1.26*  CALCIUM 9.0 8.4   CBG (last 3)   Recent Labs  06/04/14 1642 06/04/14 2058 06/05/14 0637  GLUCAP 249* 252* 176*    Wt Readings from Last 3 Encounters:  05/19/14 100.3 kg (221 lb 1.9 oz)  01/04/12 109.09 kg (240 lb 8 oz)  09/11/11 110.496 kg (243 lb 9.6 oz)    Physical Exam:  No distressed. Sitting with eyes closed. Opens them on command  HENT: mild thrush on sides of tongue Craniotomy site well-healed. No edema. ?nystagmus laterally---keeps eyes closed  Eyes: Conjunctivae and EOM are normal. Pupils are equal, round, and reactive to light.  Pupils round and reactive to light without nystagmus  Neck: Normal range of motion. Neck supple. No JVD present. No tracheal deviation present. No thyromegaly present.  Cardiovascular: Normal rate and regular rhythm.  Respiratory: Effort normal and breath sounds normal. No respiratory distress. She has no wheezes. She has no rales.  GI: Soft. Bowel sounds are normal. She exhibits no distension. There is no tenderness. There is no rebound.  Musculoskeletal:  She moves all extremities  Lymphadenopathy:   She has no cervical adenopathy.  Neurological:  Mood is appropriate.  Oriented to place, reason she's at hospital. f She was able to provide her name, age. UES: grossly 3-4/5 proximal to distal UE's. HF 3/5, KE 4-, ADF/APF 4+. Senses pain in all 4's. Decreased Moquino on left.  Psychiatric: She has a normal mood and affect--more dynamic. Her  behavior is normal.   Assessment/Plan: 1. Functional deficits secondary to right meningioma s/p resection which require 3+ hours per day of interdisciplinary therapy in a comprehensive inpatient rehab setting. Physiatrist is providing close team supervision and 24 hour management of active medical problems listed below. Physiatrist and rehab team continue to assess barriers to discharge/monitor patient progress toward functional and medical goals. Tachycardia- check EKG if afib then cardiology consult , if sinus tach look for underlying cause- given age and body habitus deconditioning may be a factor FIM: FIM - Bathing Bathing Steps Patient Completed: Chest, Right Arm, Left Arm, Abdomen, Front perineal area, Right upper leg, Left upper leg Bathing: 3: Mod-Patient completes 5-7 30f10 parts or 50-74%  FIM - Upper Body Dressing/Undressing Upper body dressing/undressing steps patient completed: Thread/unthread right sleeve of pullover shirt/dresss, Thread/unthread left sleeve of pullover shirt/dress, Put head through opening of pull over shirt/dress Upper body dressing/undressing: 4: Min-Patient completed 75 plus % of tasks FIM - Lower Body Dressing/Undressing Lower body dressing/undressing steps patient completed: Pull underwear up/down, Thread/unthread left underwear leg, Thread/unthread right pants leg, Thread/unthread left pants leg Lower body dressing/undressing: 1: Total-Patient completed less than 25% of tasks  FIM - Toileting Toileting steps completed by patient: Performs perineal hygiene Toileting: 2: Max-Patient completed 1 of 3 steps  FIM - TRadio producerDevices: Bedside commode, WInsurance account managerTransfers: 4-From toilet/BSC: Min A (steadying Pt. > 75%), 4-To toilet/BSC: Min A (steadying Pt. > 75%)  FIM -  Bed/Chair Financial planner Devices: HOB elevated, Bed rails, Walker Bed/Chair Transfer: 4: Bed > Chair or W/C: Min A (steadying  Pt. > 75%), 4: Chair or W/C > Bed: Min A (steadying Pt. > 75%), 4: Supine > Sit: Min A (steadying Pt. > 75%/lift 1 leg), 4: Sit > Supine: Min A (steadying pt. > 75%/lift 1 leg)  FIM - Locomotion: Wheelchair Distance: 50 Locomotion: Wheelchair: 2: Travels 50 - 149 ft with minimal assistance (Pt.>75%) FIM - Locomotion: Ambulation Locomotion: Ambulation Assistive Devices: Administrator Ambulation/Gait Assistance: 4: Min assist Locomotion: Ambulation: 1: Travels less than 50 ft with minimal assistance (Pt.>75%)  Comprehension Comprehension Mode: Auditory Comprehension: 4-Understands basic 75 - 89% of the time/requires cueing 10 - 24% of the time  Expression Expression Mode: Verbal Expression: 4-Expresses basic 75 - 89% of the time/requires cueing 10 - 24% of the time. Needs helper to occlude trach/needs to repeat words.  Social Interaction Social Interaction: 4-Interacts appropriately 75 - 89% of the time - Needs redirection for appropriate language or to initiate interaction.  Problem Solving Problem Solving: 3-Solves basic 50 - 74% of the time/requires cueing 25 - 49% of the time  Memory Memory: 3-Recognizes or recalls 50 - 74% of the time/requires cueing 25 - 49% of the time Medical Problem List and Plan: 1. Functional deficits secondary to right frontal meningioma status post resection 05/08/2014  -more sedated over last 18 hours--can fluctuate.   -ua equivocal, culture pending  -follow up ct with improvement  -labwork unremarkable yesterday except for the fact that her Cr is slightly elevated.   -reduce meds. THT's are normal thus far 2. DVT Prophylaxis/Anticoagulation: Subcutaneous heparin initiated 05/22/2014. Monitor for any bleeding episodes 3. Pain Management: Hydrocodone as needed. Monitor with increased mobility 4. Mood/depression: Cymbalta 60 mg twice a day. Provide emotional support 5. Neuropsych: This patient is capable of making decisions on her own behalf. 6.  Skin/Wound Care: Routine skin checks 7. Fluids/Electrolytes/Nutrition/ ongoing nausea: encourage po  -RD following  -poor po intake likely multifactorial--likely vestibular component also---vestibular evaluation  -megace trial  -IVF hs initiated---recheck bmet tomorrow 8. Seizure disorder. Keppra 500 mg 3 times a day, Vimpat 200 mg twice a day.    -consider neuro follow up if there is ongoing suspicion of absence type sz activity 9. Dysphagia. Dysphagia 3 nectar liquids. Follow speech therapy. Monitor hydration 10. Constipation with improvement, still with poor appetite/nausea-. Continue Mira lax twice a day.   -dc reglan due to somnolence---observe patterns 11. Hypertension. Clonidine patch 0.3 mg weekly, Lasix 40 mg twice a day. Markedly increased mobility 12 hypothyroidism. Synthroid 13. History of left breast cancer. Continue arimidex 14. Diabetes mellitus and peripheral neuropathy. Hemoglobin A1c 9.4. Sliding scale insulin. Patient on Liraglutide 18 mg subcutaneously daily prior to admission.  -began nph insulin to better control sugars for now---titrate to 10u bid. 15. History of gout. Colchicine 0.6 mg twice a day resumed 16. COPD. Continue Dulera 2 puffs twice daily. No shortness of breath  LOS (Days) 7 A FACE TO FACE EVALUATION WAS PERFORMED  [name] 06/05/2014 8:39 AM

## 2014-06-05 NOTE — Progress Notes (Signed)
Speech Language Pathology Weekly Progress and Session Note  Patient Details  Name: Morgan [name] MRN: [id number] Date of Birth: Dec 26, 1940  Beginning of progress report period: May 29, 2014 End of progress report period: June 05, 2014  Today's Date: 06/05/2014 SLP Individual Time: 1300-1400 SLP Individual Time Calculation (min): 60 min  Short Term Goals: Week 1: SLP Short Term Goal 1 (Week 1): Patient will consume current diet with minimal overt s/s of aspiration with Min Averbal cues for use of swallowing strategies  SLP Short Term Goal 1 - Progress (Week 1): Met SLP Short Term Goal 2 (Week 1): Patient will consume trials of thin liquids with minimal overt s/s of aspiration with Min A multimodal cues needed for use of small sips.  SLP Short Term Goal 2 - Progress (Week 1): Met SLP Short Term Goal 3 (Week 1): Patient will keep her eyes open during functional tasks for 5 minutes with Max A multimodal cues.  SLP Short Term Goal 3 - Progress (Week 1): Met SLP Short Term Goal 4 (Week 1): Patient will recall new, daily information with Min A multimodal cues for use of external memory aids SLP Short Term Goal 4 - Progress (Week 1): Not met SLP Short Term Goal 5 (Week 1): Patient will utilize an increased vocal intensity at the phrase level with supervision verbal cues.  SLP Short Term Goal 5 - Progress (Week 1): Not met    New Short Term Goals: Week 2: SLP Short Term Goal 1 (Week 2): Patient will consume current diet with minimal overt s/s of aspiration with Supervision A verbal cues for use of swallowing strategies  SLP Short Term Goal 2 (Week 2): Patient will keep her eyes open during functional tasks for 5 minutes with Mod A multimodal cues.  SLP Short Term Goal 3 (Week 2): Patient will recall new, daily information with Min A multimodal cues for use of external memory aids SLP Short Term Goal 4 (Week 2): Patient will demonstrate basic, functional problem solving with Mod A  multimodal cues. SLP Short Term Goal 5 (Week 2): Patient will utilize an increased vocal intensity at the phrase level with Min A verbal cues.   Weekly Progress Updates: Patient has made consistent and functional gains this reporting period and has met 3 out of 5 STGs due to increased arousal, sustained attention, and fluctuating activity tolerance. At this time, patient is consuming Dys. 3 textures with thin liquids and exhibits minimal overt s/s of aspiration, however, can be difficult to differentiate from cough which occurs intermittently regardless of patient consumption status. Patient requires Min A multimodal cues for use of safe swallow strategies. Currently patient requires Mod A multimodal cues for sustained attention for ~90 seconds in a moderately distracting environment, functional problem-solving, short-term recall, emerging awareness of deficits, and to keep eyes open during functional activities, and Min A multimodal cues for safety awareness. Patient and family are currently awaiting SNF placement closer to home; patient and family education is ongoing. Patient would benefit from skilled SLP intervention to maximize cognitive functioning and functional independence in order to reduce caregiver burden prior to discharge.   Intensity: Minumum of 1-2 x/day, 30 to 90 minutes Frequency: 3 to 5 out of 7 days Duration/Length of Stay: TBD due to SNF placement Treatment/Interventions: Cognitive remediation/compensation;Cueing hierarchy;Dysphagia/aspiration precaution training;Environmental controls;Functional tasks;Internal/external aids;Patient/family education;Therapeutic Activities   Daily Session  Skilled Therapeutic Interventions: Skilled treatment session focused on cognitive goals. Upon arrival, patient was supine in bed and lethargic, reporting dizziness  and nausea, but agreeable to participate in therapy. Patient repositioned and raised to sitting in bed to facilitate arousal and  attention. Student facilitated session by providing Mod A multimodal cues for problem-solving and short-term recall during basic functional money management task. Patient required Supervision cues for utilization of external memory aid related to task, and Mod A increasing to Max A multimodal cues for sustained attention in a mildly distracting environment for ~60 seconds; suspect patient fatigue resulted in need for increased cueing by end of session. Patient left sitting up in bed with friend and son present. Continue with current plan of care.    FIM:  Comprehension Comprehension Mode: Auditory Comprehension: 4-Understands basic 75 - 89% of the time/requires cueing 10 - 24% of the time Expression Expression Mode: Verbal Expression: 3-Expresses basic 50 - 74% of the time/requires cueing 25 - 50% of the time. Needs to repeat parts of sentences. Social Interaction Social Interaction: 3-Interacts appropriately 50 - 74% of the time - May be physically or verbally inappropriate. Problem Solving Problem Solving: 3-Solves basic 50 - 74% of the time/requires cueing 25 - 49% of the time Memory Memory: 3-Recognizes or recalls 50 - 74% of the time/requires cueing 25 - 49% of the time  Pain Pain Assessment Pain Assessment: No/denies pain  Therapy/Group: Individual Therapy  Servando Snare 06/05/2014, 3:43 PM

## 2014-06-05 NOTE — Progress Notes (Signed)
Physical Therapy Session Note  Patient Details  Name: Morgan [name] MRN: [id number] Date of Birth: [birth year]  Today's Date: 06/05/2014 PT Individual Time: 1100-1130 PT Individual Time Calculation (min): 30 min   Short Term Goals: Week 1:  PT Short Term Goal 1 (Week 1): Pt will increase transfers supine to edge of bed. edge of bed to supine with S.  PT Short Term Goal 2 (Week 1): Pt will increase transfers bed to chair, chair to bed with LRAD to min A.  PT Short Term Goal 3 (Week 1): Pt will increase ambulation with LRAD about 50 feet with min A.  PT Short Term Goal 4 (Week 1): Pt will propel w/c with B LEs about 100 feet with S.  PT Short Term Goal 5 (Week 1): Pt will ascend/descend 4 stairs with B rails and min A.   Skilled Therapeutic Interventions/Progress Updates:  1:1. Pt received supine in bed with son at bed side, ready and willing to participate in physical therapy. Session focused on functional transfers and monitoring cardiorespiratory response to exercise. When received, pt was maintaining HR in supine between 110-114bpm. Pt was transferred supine<>sit with modA and stand pivot transfer was performed with RW bed<>chair with modA. HR reassessed once seated in w/c HR = 116-118bpm. Pt transported to/from therapy gym for time management throughout session. After a short seated rest break, vitals were reassessed on R LE. BP 165/118 SaO2: 98% and HR 116. Confirmed with a second reading in sitting on elevated L LE. BP: 174/134, SaO2: 98%. Pt reported overall not feeling well stating that she "just feels crappy."  Vitals contraindicated for safe participation in physical therapy session, formal treatment session ended pt therefore missed 30 minutes. RN notified and pt transported back to room and assisted to supine in bed, HOB raised, vitals reassessed. BP: 147/73, SaO2: 98% and HR 111. Pt left supine, in bed with HOB raised, son at bed side.  PA-C immediately notified of vitals in response  to activity.  Therapy Documentation Precautions:  Precautions Precautions: Fall Precaution Comments: decreased sustained attention Restrictions Weight Bearing Restrictions: Yes  See FIM for current functional status  Therapy/Group: Individual Therapy  Carlos Levering 06/05/2014, 11:48 AM

## 2014-06-05 NOTE — Progress Notes (Signed)
Occupational Therapy Session Note  Patient Details  Name: Morgan [name] MRN: [id number] Date of Birth: [birth year]  Today's Date: 06/05/2014 OT Individual Time: 0800-0900 OT Individual Time Calculation (min): 60 min    Short Term Goals: Week 2:     Skilled Therapeutic Interventions/Progress Updates:    Pt seen for ADL retraining with focus on bed mobility, standing balance, task initiation/completion, gaze stabilization, and activity tolerance. Pt received reclined in bed with son present. Pt required encouragement from therapist and son for participation. Completed bathing and dressing sitting EOB and required mod-max cues for task initiation and sequencing. Completed sit<>stand 3x with min assist and mod cues. Stood with RW up to 20 seconds during LB self-care with min A for balance. Pt required max cues for use of "A" for gaze stabilization during transitional movements. Pt fatigued quickly throughout session, requiring multiple rest breaks and max cue eye opening. HR ranged from 80-129 while sitting EOB and following sit<>stand. SpO2>94% on room air throughout. At end of session pt returned to supine in bed with min A and left with all needs in reach.   Therapy Documentation Precautions:  Precautions Precautions: Fall Precaution Comments: decreased sustained attention Restrictions Weight Bearing Restrictions: Yes General:   Vital Signs:  Pain: No report of pain  See FIM for current functional status  Therapy/Group: Individual Therapy  Duayne Cal 06/05/2014, 11:19 AM

## 2014-06-05 NOTE — Progress Notes (Addendum)
Inpatient Diabetes Program Recommendations  AACE/ADA: New Consensus Statement on Inpatient Glycemic Control (2013)  Target Ranges:  Prepandial:   less than 140 mg/dL      Peak postprandial:   less than 180 mg/dL (1-2 hours)      Critically ill patients:  140 - 180 mg/dL   Results for Morgan [name], Morgan [name] (MRN [id number]) as of 06/05/2014 11:03  Ref. Range 06/04/2014 11:37 06/04/2014 16:42 06/04/2014 20:58 06/05/2014 06:37  Glucose-Capillary Latest Range: 70-99 mg/dL 251 (H) 249 (H) 252 (H) 176 (H)    Reason for Assessment: elevated CBG  Diabetes history: Type 2  Patient is ordered NPH 10 units bid and Novolog 0-20 units tid and hs (patient also takes Liraglutide 1.8mg ) .  The patient did not receive any Novolog correction yesterday as ordered. Had she received the medication as ordered her CBG would likely be ideal.  Will re-educate the staff on the importance of correction insulin, regardless if the patient is eating.   Gentry Fitz, RN, BA, MHA, CDE Diabetes Coordinator Inpatient Diabetes Program  [phone] (Team Pager) [phone] [facility]) 06/05/2014 11:06 AM

## 2014-06-05 NOTE — Plan of Care (Signed)
Problem: RH KNOWLEDGE DEFICIT Goal: RH STG INCREASE KNOWLEDGE OF DIABETES Patient will verbalize sign and symptoms of hyper/hypoglecimia by discharge with mod assist  Outcome: Progressing Pt.'s family Goal: RH STG INCREASE KNOWLEDGE OF HYPERTENSION Patient will verbalize signs and symptoms of hyper/hypotension by discharge with min assistance  Pt's family Goal: RH STG INCREASE KNOWLEDGE OF DYSPHAGIA/FLUID INTAKE Reinforce dys. Diet and precautions with pt's son.

## 2014-06-06 ENCOUNTER — Encounter (HOSPITAL_COMMUNITY): Payer: Medicare Other | Admitting: Occupational Therapy

## 2014-06-06 ENCOUNTER — Inpatient Hospital Stay (HOSPITAL_COMMUNITY): Payer: Medicare Other | Admitting: Speech Pathology

## 2014-06-06 ENCOUNTER — Inpatient Hospital Stay (HOSPITAL_COMMUNITY): Payer: Medicare Other | Admitting: *Deleted

## 2014-06-06 LAB — GLUCOSE, CAPILLARY
GLUCOSE-CAPILLARY: 156 mg/dL — AB (ref 70–99)
GLUCOSE-CAPILLARY: 160 mg/dL — AB (ref 70–99)
GLUCOSE-CAPILLARY: 151 mg/dL — AB (ref 70–99)
Glucose-Capillary: 126 mg/dL — ABNORMAL HIGH (ref 70–99)

## 2014-06-06 NOTE — Progress Notes (Signed)
Morgan [name] is a 74 y.o. female December 10, 1940 [id number]  Subjective: No new complaints. Denies dizziness, palpitations - Just "very tired", no other new problems. Slept well.   Objective: Vital signs in last 24 hours: Temp:  [97.7 F (36.5 C)-97.8 F (36.6 C)] 97.7 F (36.5 C) (02/27 0606) Pulse Rate:  [99-116] 113 (02/27 0606) Resp:  [16-17] 16 (02/27 0606) BP: (141-174)/(70-134) 141/92 mmHg (02/27 0606) SpO2:  [94 %-99 %] 97 % (02/27 0939) Weight change:  Last BM Date: 06/04/14  Intake/Output from previous day: 02/26 0701 - 02/27 0700 In: 280 [P.O.:280] Out: -   Physical Exam General: No apparent distress  Withdrawn/quiet and somnolent but easily aroused - Dtr at [facility]  Lungs: Normal effort. Lungs clear to auscultation, no crackles or wheezes. Cardiovascular: Regular rate and rhythm, no edema Neurological: No new neurological deficits   Lab Results: BMET    Component Value Date/Time   NA 129* 06/04/2014 0535   K 4.0 06/04/2014 0535   CL 94* 06/04/2014 0535   CO2 26 06/04/2014 0535   GLUCOSE 164* 06/04/2014 0535   BUN 13 06/04/2014 0535   CREATININE 1.26* 06/04/2014 0535   CREATININE 1.32* 09/11/2011 1110   CALCIUM 8.4 06/04/2014 0535   GFRNONAA 41* 06/04/2014 0535   GFRAA 48* 06/04/2014 0535   CBC    Component Value Date/Time   WBC 6.0 06/01/2014 0645   RBC 3.23* 06/01/2014 0645   HGB 9.7* 06/01/2014 0645   HCT 29.3* 06/01/2014 0645   PLT 193 06/01/2014 0645   MCV 90.7 06/01/2014 0645   MCH 30.0 06/01/2014 0645   MCHC 33.1 06/01/2014 0645   RDW 14.4 06/01/2014 0645   LYMPHSABS 2.3 06/01/2014 0645   MONOABS 0.5 06/01/2014 0645   EOSABS 0.3 06/01/2014 0645   BASOSABS 0.0 06/01/2014 0645   CBG's (last 3):   Recent Labs  06/05/14 1724 06/05/14 2128 06/06/14 0659  GLUCAP 155* 125* 126*   LFT's Lab Results  Component Value Date   ALT 42* 06/01/2014   AST 49* 06/01/2014   ALKPHOS 320* 06/01/2014   BILITOT 0.5 06/01/2014     Studies/Results: No results found.  Medications:  I have reviewed the patient's current medications. Scheduled Medications: . anastrozole  1 mg Oral Daily  . cloNIDine  0.3 mg Transdermal Weekly  . colchicine  0.6 mg Oral BID  . DULoxetine  60 mg Oral BID  . feeding supplement (ENSURE COMPLETE)  237 mL Oral BID BM  . feeding supplement (ENSURE)  1 Container Oral BID BM  . furosemide  40 mg Oral BID  . guaiFENesin  600 mg Oral BID  . heparin  5,000 Units Subcutaneous 3 times per day  . insulin aspart  0-20 Units Subcutaneous TID WC & HS  . insulin NPH Human  10 Units Subcutaneous BID AC & HS  . lacosamide  200 mg Oral BID  . levETIRAcetam  500 mg Oral TID  . levothyroxine  200 mcg Oral Once per day on Mon Tue Wed Thu Fri Sat  . levothyroxine  300 mcg Oral Q Sun  . Liraglutide  1.8 mg Subcutaneous Daily  . megestrol  400 mg Oral BID  . mometasone-formoterol  2 puff Inhalation BID  . pantoprazole  40 mg Oral BID  . polyethylene glycol  17 g Oral BID  . scopolamine  1 patch Transdermal Q72H   PRN Medications: acetaminophen, acetaminophen, artificial tears, bisacodyl, HYDROcodone-acetaminophen, nitroGLYCERIN, ondansetron **OR** ondansetron (ZOFRAN) IV, sodium chloride, sodium phosphate, sorbitol  Assessment/Plan: Principal  Problem:   Meningioma Active Problems:   Type 2 diabetes mellitus with polyneuropathy   Left hemiparesis  Medical Problem List and Plan: 1. Functional deficits secondary to right frontal meningioma status post resection 05/08/2014 - mental status and alertness can fluctuate - multifactorial  -reduce meds as able. THT's are normal thus far 2. DVT Prophylaxis/Anticoagulation: Subcutaneous heparin initiated 05/22/2014. Monitor for any bleeding episodes 3. Pain Management: Hydrocodone as needed. Monitor with increased mobility 4. Mood/depression: Cymbalta 60 mg twice a day. Provide emotional support 5. Neuropsych: This patient is  capable of making decisions on her own behalf. 6. Skin/Wound Care: Routine skin checks 7. Fluids/Electrolytes/Nutrition/ ongoing nausea: encourage po -RD following -poor po intake likely multifactorial--likely vestibular component also---vestibular evaluation -megace trial -IVF hs initiated--- 8. Seizure disorder. Keppra 500 mg 3 times a day, Vimpat 200 mg twice a day.  -consider neuro follow up if there is ongoing suspicion of absence type sz activity 9. Dysphagia. Dysphagia 3 nectar liquids. Follow speech therapy. Monitor hydration 10. Constipation with improvement, still with poor appetite/nausea-. Continue Miralax twice a day.  -off reglan due to somnolence---observe patterns 11. Hypertension. Clonidine patch 0.3 mg weekly, Lasix 40 mg twice a day. Markedly increased mobility 12 hypothyroidism. Synthroid 13. History of left breast cancer. Continue arimidex 14. Diabetes mellitus and peripheral neuropathy. Hemoglobin A1c 9.4. PTA Sliding scale insulin with 500U conc standing meal coverage and Liraglutide 1.8 mg subcutaneously daily  -began nph insulin to better control sugars for now---titrate as needed depending on control and intake. 15. History of gout. Colchicine 0.6 mg twice a day as PTA 16. COPD. Continue Dulera 2 puffs twice daily. No shortness of breath  Length of stay, days: 8   Valerie A. Asa Lente, MD 06/06/2014, 10:34 AM

## 2014-06-06 NOTE — Progress Notes (Signed)
Requiring cues to keep eyes open when giving meds. Per son, patient does better taking meds with liquid vs puree. Tolerated all scheduled HS meds, until taking megace. Patient with dry heaves, no vomiting. Increased tremors, per daughter report. Chronic cough. Getting up to [facility]  1/2 NS at 75cc/hr 1900-0700 via RUE PICC line. PRN tylenol given at 0029 for C/O HA. Daughter at bedside providing hands on assist. Cornell Barman

## 2014-06-06 NOTE — Progress Notes (Signed)
Speech Language Pathology Daily Session Note  Patient Details  Name: JAMYRA ZWEIG MRN: [id number] Date of Birth: 12/10/1940  Today's Date: 06/06/2014 SLP Individual Time: 1000-1100 SLP Individual Time Calculation (min): 60 min  Short Term Goals: Week 1: SLP Short Term Goal 1 (Week 1): Patient will consume current diet with minimal overt s/s of aspiration with Min Averbal cues for use of swallowing strategies  SLP Short Term Goal 1 - Progress (Week 1): Met SLP Short Term Goal 2 (Week 1): Patient will consume trials of thin liquids with minimal overt s/s of aspiration with Min A multimodal cues needed for use of small sips.  SLP Short Term Goal 2 - Progress (Week 1): Met SLP Short Term Goal 3 (Week 1): Patient will keep her eyes open during functional tasks for 5 minutes with Max A multimodal cues.  SLP Short Term Goal 3 - Progress (Week 1): Met SLP Short Term Goal 4 (Week 1): Patient will recall new, daily information with Min A multimodal cues for use of external memory aids SLP Short Term Goal 4 - Progress (Week 1): Not met SLP Short Term Goal 5 (Week 1): Patient will utilize an increased vocal intensity at the phrase level with supervision verbal cues.  SLP Short Term Goal 5 - Progress (Week 1): Not met  Skilled Therapeutic Interventions: Skilled ST intervention provided with tx focus on dysphagia and cognitive deficits. Pt repositioned in bed to upright 90 degrees. Mod multimodal cues required to increase alertness and keep eyes open throughout snack. Pt consumed 1 graham cracker and thin liquid via cup sips with no overt s/s of aspiration. Pt required min verbal cues to take small sips, self-fed throughout snack. Daughter present and stated that pt ate entire meal last night, which family member brought in from [employer]. Pt oriented x4 with use of external aides. Verbal cues required x4 to increase vocal intensity at sentence level. Slp stood at foot of bed to encourage pt to  speak louder. Pt attended for 5 minutes at a time before requiring verbal/tactile cues to redirect to tasks. Problem solving activity completed, 4-step sequencing cards, with 70% accuracy, min-mod A.   FIM:  Comprehension Comprehension Mode: Auditory Comprehension: 4-Understands basic 75 - 89% of the time/requires cueing 10 - 24% of the time Expression Expression Mode: Verbal Expression: 4-Expresses basic 75 - 89% of the time/requires cueing 10 - 24% of the time. Needs helper to occlude trach/needs to repeat words. Social Interaction Social Interaction: 3-Interacts appropriately 50 - 74% of the time - May be physically or verbally inappropriate. Problem Solving Problem Solving: 3-Solves basic 50 - 74% of the time/requires cueing 25 - 49% of the time Memory Memory: 3-Recognizes or recalls 50 - 74% of the time/requires cueing 25 - 49% of the time FIM - Eating Eating Activity: 5: Supervision/cues  Pain Pain Assessment Pain Assessment: Faces Pain Score: 4  Pain Location: Head Pain Intervention(s): Medication (See eMAR)  Therapy/Group: Individual Therapy  Ger Nicks, Bernerd Pho 06/06/2014, 12:41 PM

## 2014-06-07 ENCOUNTER — Inpatient Hospital Stay (HOSPITAL_COMMUNITY): Payer: Medicare Other

## 2014-06-07 LAB — GLUCOSE, CAPILLARY
GLUCOSE-CAPILLARY: 142 mg/dL — AB (ref 70–99)
GLUCOSE-CAPILLARY: 181 mg/dL — AB (ref 70–99)
GLUCOSE-CAPILLARY: 135 mg/dL — AB (ref 70–99)
Glucose-Capillary: 139 mg/dL — ABNORMAL HIGH (ref 70–99)

## 2014-06-07 NOTE — Progress Notes (Signed)
Physical Therapy Session Note  Patient Details  Name: Morgan [name] MRN: [id number] Date of Birth: 1940-06-26  Today's Date: 06/07/2014 PT Individual Time: 1100-1110 PT Individual Time Calculation (min): 10 min  and Today's Date: 06/07/2014 PT Missed Time: 50 Minutes Missed Time Reason: Patient ill (Comment) (significant nausea/vomiting)  Short Term Goals: Week 1:  PT Short Term Goal 1 (Week 1): Pt will increase transfers supine to edge of bed. edge of bed to supine with S.  PT Short Term Goal 2 (Week 1): Pt will increase transfers bed to chair, chair to bed with LRAD to min A.  PT Short Term Goal 3 (Week 1): Pt will increase ambulation with LRAD about 50 feet with min A.  PT Short Term Goal 4 (Week 1): Pt will propel w/c with B LEs about 100 feet with S.  PT Short Term Goal 5 (Week 1): Pt will ascend/descend 4 stairs with B rails and min A.   Skilled Therapeutic Interventions/Progress Updates:   Upon therapist arrival to room pt in bed with eyes closed with emesis bag sitting on chest. Pt appeared very ill, complaining of significant nausea and general feeling unwell. While therapist in room pt had small bout of emesis, therapist assisted with cleanup. Pt declined OOB therapy this AM due to feeling unwell. Therapist offered to roll pt to side due to complaints of tailbone pain, pt agreed. Pt rolled to R with MinA, therapist placed pillows to assist with maintaining pt in sidelying position. Pt left in R sidelying with son present w/ all needs within reach. RN made aware of pt's status.  Therapy Documentation Precautions:  Precautions Precautions: Fall Precaution Comments: decreased sustained attention Restrictions Weight Bearing Restrictions: Yes General:   Vital Signs: Therapy Vitals Temp: 98.7 F (37.1 C) Temp Source: Oral Pulse Rate: (!) 110 Resp: 17 BP: (!) 171/92 mmHg Oxygen Therapy SpO2: 97 % O2 Device: Not Delivered Pain:    See FIM for current functional  status  Therapy/Group: Individual Therapy  Rada Hay  Rada Hay, PT, DPT 06/07/2014, 7:47 AM

## 2014-06-07 NOTE — Progress Notes (Signed)
Morgan [name] is a 74 y.o. female 03-07-1941 [id number]  Subjective: Still poor appetite and limited intake. Denies nausea, pain - Just "very tired"; no other new problems. Slept well.   Objective: Vital signs in last 24 hours: Temp:  [97.8 F (36.6 C)-98.7 F (37.1 C)] 98.7 F (37.1 C) (02/28 0522) Pulse Rate:  [110] 110 (02/28 0522) Resp:  [16-17] 17 (02/28 0522) BP: (152-171)/(92-93) 171/92 mmHg (02/28 0522) SpO2:  [95 %-97 %] 97 % (02/28 0522) Weight change:  Last BM Date: 06/04/14  Intake/Output from previous day: 02/27 0701 - 02/28 0700 In: 320 [P.O.:320] Out: 750 [Urine:750]  Physical Exam General: No apparent distress  More alert but remains very quiet - son at [facility]  Lungs: Normal effort. Lungs clear to auscultation, no crackles or wheezes. Cardiovascular: Regular rate and rhythm, no edema Neurological: No new neurological deficits   Lab Results: BMET    Component Value Date/Time   NA 129* 06/04/2014 0535   K 4.0 06/04/2014 0535   CL 94* 06/04/2014 0535   CO2 26 06/04/2014 0535   GLUCOSE 164* 06/04/2014 0535   BUN 13 06/04/2014 0535   CREATININE 1.26* 06/04/2014 0535   CREATININE 1.32* 09/11/2011 1110   CALCIUM 8.4 06/04/2014 0535   GFRNONAA 41* 06/04/2014 0535   GFRAA 48* 06/04/2014 0535   CBC    Component Value Date/Time   WBC 6.0 06/01/2014 0645   RBC 3.23* 06/01/2014 0645   HGB 9.7* 06/01/2014 0645   HCT 29.3* 06/01/2014 0645   PLT 193 06/01/2014 0645   MCV 90.7 06/01/2014 0645   MCH 30.0 06/01/2014 0645   MCHC 33.1 06/01/2014 0645   RDW 14.4 06/01/2014 0645   LYMPHSABS 2.3 06/01/2014 0645   MONOABS 0.5 06/01/2014 0645   EOSABS 0.3 06/01/2014 0645   BASOSABS 0.0 06/01/2014 0645   CBG's (last 3):    Recent Labs  06/06/14 1632 06/06/14 2106 06/07/14 0701  GLUCAP 160* 151* 139*   LFT's Lab Results  Component Value Date   ALT 42* 06/01/2014   AST 49* 06/01/2014   ALKPHOS 320* 06/01/2014   BILITOT 0.5 06/01/2014     Studies/Results: No results found.  Medications:  I have reviewed the patient's current medications. Scheduled Medications: . anastrozole  1 mg Oral Daily  . cloNIDine  0.3 mg Transdermal Weekly  . colchicine  0.6 mg Oral BID  . DULoxetine  60 mg Oral BID  . feeding supplement (ENSURE COMPLETE)  237 mL Oral BID BM  . feeding supplement (ENSURE)  1 Container Oral BID BM  . furosemide  40 mg Oral BID  . guaiFENesin  600 mg Oral BID  . heparin  5,000 Units Subcutaneous 3 times per day  . insulin aspart  0-20 Units Subcutaneous TID WC & HS  . insulin NPH Human  10 Units Subcutaneous BID AC & HS  . lacosamide  200 mg Oral BID  . levETIRAcetam  500 mg Oral TID  . levothyroxine  200 mcg Oral Once per day on Mon Tue Wed Thu Fri Sat  . levothyroxine  300 mcg Oral Q Sun  . Liraglutide  1.8 mg Subcutaneous Daily  . megestrol  400 mg Oral BID  . mometasone-formoterol  2 puff Inhalation BID  . pantoprazole  40 mg Oral BID  . polyethylene glycol  17 g Oral BID  . scopolamine  1 patch Transdermal Q72H   PRN Medications: acetaminophen, acetaminophen, artificial tears, bisacodyl, HYDROcodone-acetaminophen, nitroGLYCERIN, ondansetron **OR** ondansetron (ZOFRAN) IV, sodium chloride, sodium phosphate,  sorbitol  Assessment/Plan: Principal Problem:   Meningioma Active Problems:   Type 2 diabetes mellitus with polyneuropathy   Left hemiparesis  Medical Problem List and Plan: 1. Functional deficits secondary to right frontal meningioma status post resection 05/08/2014 - mental status and alertness can fluctuate - multifactorial  -reduce meds as able. THT's are normal thus far 2. DVT Prophylaxis/Anticoagulation: Subcutaneous heparin initiated 05/22/2014. Monitor for any bleeding episodes 3. Pain Management: Hydrocodone as needed. Monitor with increased mobility 4. Mood/depression: Cymbalta 60 mg twice a day. Provide emotional support 5. Neuropsych: This patient is  capable of making decisions on her own behalf. 6. Skin/Wound Care: Routine skin checks 7. Fluids/Electrolytes/Nutrition/ ongoing nausea: encourage po -RD following -poor po intake likely multifactorial--likely vestibular component also---vestibular evaluation -megace trial -IVF hs initiated--- recheck BMet in AM 8. Seizure disorder. Keppra 500 mg 3 times a day, Vimpat 200 mg twice a day.  -consider neuro follow up if there is ongoing suspicion of absence type sz activity 9. Dysphagia. Dysphagia 3 nectar liquids. Follow speech therapy. Monitor hydration 10. Constipation, improved. Continue Miralax twice a day.  -off reglan due to somnolence---observe patterns 11. Hypertension. Clonidine patch 0.3 mg weekly, Lasix 40 mg twice a day. Labile control with increased mobility 12 hypothyroidism. Synthroid 13. History of left breast cancer. Continue arimidex 14. Diabetes mellitus and peripheral neuropathy. Hemoglobin A1c 9.4. PTA Sliding scale insulin with 500U conc standing meal coverage and Liraglutide 1.8 mg subcutaneously daily  -began nph insulin to better control sugars for now---titrate as needed depending on control and intake. 15. History of gout. Colchicine 0.6 mg twice a day as PTA 16. COPD. Continue Dulera 2 puffs twice daily. No shortness of breath  Length of stay, days: 9   Morgan [name] A. Asa Lente, MD 06/07/2014, 8:59 AM

## 2014-06-07 NOTE — Progress Notes (Addendum)
Pt. Reporting nausea/ vomiting this AM.  IV zofran given, patient reports unchanged response.  Refusing medications and therapies.  Very Poor PO intake.  Family at bedside.  Scopolamine patch replaced per order.  Will continue to monitor.

## 2014-06-08 ENCOUNTER — Inpatient Hospital Stay (HOSPITAL_COMMUNITY): Payer: Medicare Other | Admitting: Speech Pathology

## 2014-06-08 ENCOUNTER — Inpatient Hospital Stay (HOSPITAL_COMMUNITY): Payer: Medicare Other | Admitting: Physical Therapy

## 2014-06-08 ENCOUNTER — Inpatient Hospital Stay (HOSPITAL_COMMUNITY): Payer: Medicare Other

## 2014-06-08 LAB — BASIC METABOLIC PANEL
Anion gap: 9 (ref 5–15)
BUN: 13 mg/dL (ref 6–23)
CO2: 21 mmol/L (ref 19–32)
Calcium: 8.7 mg/dL (ref 8.4–10.5)
Chloride: 98 mmol/L (ref 96–112)
Creatinine, Ser: 1.3 mg/dL — ABNORMAL HIGH (ref 0.50–1.10)
GFR calc Af Amer: 46 mL/min — ABNORMAL LOW (ref >90)
GFR calc non Af Amer: 40 mL/min — ABNORMAL LOW (ref >90)
Glucose, Bld: 139 mg/dL — ABNORMAL HIGH (ref 70–99)
Potassium: 4 mmol/L (ref 3.5–5.1)
Sodium: 128 mmol/L — ABNORMAL LOW (ref 135–145)

## 2014-06-08 LAB — GLUCOSE, CAPILLARY
GLUCOSE-CAPILLARY: 110 mg/dL — AB (ref 70–99)
Glucose-Capillary: 122 mg/dL — ABNORMAL HIGH (ref 70–99)
Glucose-Capillary: 134 mg/dL — ABNORMAL HIGH (ref 70–99)
Glucose-Capillary: 109 mg/dL — ABNORMAL HIGH (ref 70–99)

## 2014-06-08 MED ORDER — PRO-STAT SUGAR FREE PO LIQD
30.0000 mL | Freq: Three times a day (TID) | ORAL | Status: DC
  Administered 2014-06-09 – 2014-06-10 (×2): 30 mL via ORAL
  Filled 2014-06-08 (×10): qty 30

## 2014-06-08 MED ORDER — METOCLOPRAMIDE HCL 10 MG PO TABS
10.0000 mg | ORAL_TABLET | Freq: Three times a day (TID) | ORAL | Status: DC
  Administered 2014-06-08 – 2014-06-10 (×7): 10 mg via ORAL
  Filled 2014-06-08 (×14): qty 1

## 2014-06-08 MED ORDER — METOPROLOL TARTRATE 12.5 MG HALF TABLET
12.5000 mg | ORAL_TABLET | Freq: Two times a day (BID) | ORAL | Status: DC
  Administered 2014-06-08: 12.5 mg via ORAL
  Filled 2014-06-08 (×5): qty 1

## 2014-06-08 NOTE — Progress Notes (Signed)
Occupational Therapy Weekly Progress Note  Patient Details  Name: Morgan [name] MRN: [id number] Date of Birth: 11/23/1940  Beginning of progress report period: May 29, 2014 End of progress report period: June 08, 2014     Patient has met 2 of 5 short term goals.  Pt initially exhibited progress with BADLs but has experienced increased nausea over the past 3 days and has been unable to consistently participate in therapies.  Pt requires min encouragement to participate in therapies.  Pt's son and daughter have been present for therapy sessions.  Pt's son has assisted patient with transfers.    Patient continues to demonstrate the following deficits: decreased balance, decreased postural control, decreased strength, decreased coordination, decreased arousal, dizziness, decreased attention, decreased initiation, vestibular deficits, decreased safety and therefore will continue to benefit from skilled OT intervention to enhance overall performance with BADLs, balance, activity tolerance, postural control, safety.  Patient progressing toward long term goals..  Continue plan of care.  OT Short Term Goals Week 1:  OT Short Term Goal 1 (Week 1): Pt will initiate and complete all bathing with no more than min instructional cueing. OT Short Term Goal 1 - Progress (Week 1): Met OT Short Term Goal 2 (Week 1): Pt will keep her eyes open 75% of ADL session demonstrating sustained attention. OT Short Term Goal 2 - Progress (Week 1): Progressing toward goal OT Short Term Goal 3 (Week 1): Pt will perform LB bathing with min assist sit to stand. OT Short Term Goal 3 - Progress (Week 1): Progressing toward goal OT Short Term Goal 4 (Week 1): Pt will perform LB dressing with min assist sit to stand.  OT Short Term Goal 4 - Progress (Week 1): Progressing toward goal OT Short Term Goal 5 (Week 1): Pt will perform toilet transfer with min assist to elevated toilet or 3:1.  OT Short Term Goal 5 -  Progress (Week 1): Met Week 2:  OT Short Term Goal 1 (Week 2): Pt will keep her eyes open 75% of ADL session demonstrating sustained attention OT Short Term Goal 2 (Week 2): Pt will perform LB bathing with min assist sit<>stand OT Short Term Goal 3 (Week 2): Pt will perform LB dressing with min assist sit<>stand OT Short Term Goal 4 (Week 2): Pt will perform toileting tasks with min A      Therapy Documentation Precautions:  Precautions Precautions: Fall Precaution Comments: decreased sustained attention Restrictions Weight Bearing Restrictions: Yes  See FIM for current functional status   Leroy Libman 06/08/2014, 8:53 AM

## 2014-06-08 NOTE — Plan of Care (Signed)
Problem: RH Bed to Chair Transfers Goal: LTG Patient will perform bed/chair transfers w/assist (PT) LTG: Patient will perform bed/chair transfers with assistance, with/without cues (PT).  Downgraded due to slow progress 06/08/14  Problem: RH Car Transfers Goal: LTG Patient will perform car transfers with assist (PT) LTG: Patient will perform car transfers with assistance (PT).  Outcome: Not Applicable Date Met:  62/44/69 D/C secondary to SNF placement  Problem: RH Ambulation Goal: LTG Patient will ambulate in controlled environment (PT) LTG: Patient will ambulate in a controlled environment, # of feet with assistance (PT).  Downgraded due to slow progress 06/08/14    Goal: LTG Patient will ambulate in home environment (PT) LTG: Patient will ambulate in home environment, # of feet with assistance (PT).  Outcome: Not Applicable Date Met:  50/72/25 D/C secondary to SNF placement     Problem: RH Wheelchair Mobility Goal: LTG Patient will propel w/c in controlled environment (PT) LTG: Patient will propel wheelchair in controlled environment, # of feet with assist (PT)  Downgraded due to slow progress 06/08/14    Goal: LTG Patient will propel w/c in home environment (PT) LTG: Patient will propel wheelchair in home environment, # of feet with assistance (PT).  Outcome: Not Applicable Date Met:  75/05/18 D/C due to SNF placement  Problem: RH Balance Goal: LTG Patient will maintain dynamic standing balance (PT) LTG: Patient will maintain dynamic standing balance with assistance during mobility activities (PT)  Downgraded due to slow progress 06/08/14

## 2014-06-08 NOTE — Progress Notes (Signed)
[facility] PHYSICAL MEDICINE & REHABILITATION     PROGRESS NOTE    Subjective/Complaints: Still with nauseas. Feels dizzy. Doesn't have much appetite. tachycardic Objective: Vital Signs: Blood pressure 166/86, pulse 109, temperature 97.6 F (36.4 C), temperature source Axillary, resp. rate 17, SpO2 95 %. No results found. No results for input(s): WBC, HGB, HCT, PLT in the last 72 hours. No results for input(s): NA, K, CL, GLUCOSE, BUN, CREATININE, CALCIUM in the last 72 hours.  Invalid input(s): CO CBG (last 3)   Recent Labs  06/07/14 1626 06/07/14 2056 06/08/14 0659  GLUCAP 181* 135* 110*    Wt Readings from Last 3 Encounters:  05/19/14 100.3 kg (221 lb 1.9 oz)  01/04/12 109.09 kg (240 lb 8 oz)  09/11/11 110.496 kg (243 lb 9.6 oz)    Physical Exam:  No distressed. Sitting with eyes closed. Opens them on command  HENT: mild thrush on sides of tongue Craniotomy site well-healed. No edema. ?nystagmus laterally---keeps eyes closed  Eyes: Conjunctivae and EOM are normal. Pupils are equal, round, and reactive to light.  Pupils round and reactive to light without nystagmus  Neck: Normal range of motion. Neck supple. No JVD present. No tracheal deviation present. No thyromegaly present.  Cardiovascular: Normal rate and regular rhythm.  Respiratory: Effort normal and breath sounds normal. No respiratory distress. She has no wheezes. She has no rales.  GI: Soft. Bowel sounds are normal. She exhibits no distension. There is no tenderness. There is no rebound.  Musculoskeletal:  She moves all extremities  Lymphadenopathy:   She has no cervical adenopathy.  Neurological:  Mood is appropriate.  Oriented to place, reason she's at hospital. f She was able to provide her name, age. UES: grossly 3-4/5 proximal to distal UE's. HF 3/5, KE 4-, ADF/APF 4+. Senses pain in all 4's. Decreased [place] on left.  Psychiatric: She has a normal mood and affect--more dynamic. Her behavior is  normal.   Assessment/Plan: 1. Functional deficits secondary to right meningioma s/p resection which require 3+ hours per day of interdisciplinary therapy in a comprehensive inpatient rehab setting. Physiatrist is providing close team supervision and 24 hour management of active medical problems listed below. Physiatrist and rehab team continue to assess barriers to discharge/monitor patient progress toward functional and medical goals. Tachycardia- check EKG if afib then cardiology consult , if sinus tach look for underlying cause- given age and body habitus deconditioning may be a factor FIM: FIM - Bathing Bathing Steps Patient Completed: Chest, Right Arm, Left Arm, Abdomen, Front perineal area, Right upper leg, Left upper leg Bathing: 3: Mod-Patient completes 5-7 [id number] parts or 50-74%  FIM - Upper Body Dressing/Undressing Upper body dressing/undressing steps patient completed: Thread/unthread right sleeve of pullover shirt/dresss, Thread/unthread left sleeve of pullover shirt/dress, Put head through opening of pull over shirt/dress Upper body dressing/undressing: 4: Min-Patient completed 75 plus % of tasks FIM - Lower Body Dressing/Undressing Lower body dressing/undressing steps patient completed: Thread/unthread right underwear leg, Thread/unthread left underwear leg, Thread/unthread right pants leg, Pull pants up/down Lower body dressing/undressing: 3: Mod-Patient completed 50-74% of tasks  FIM - Toileting Toileting steps completed by patient: Performs perineal hygiene Toileting: 2: Max-Patient completed 1 of 3 steps  FIM - TRadio producerDevices: Bedside commode, WInsurance account managerTransfers: 4-From toilet/BSC: Min A (steadying Pt. > 75%), 4-To toilet/BSC: Min A (steadying Pt. > 75%)  FIM - Bed/Chair Transfer Bed/Chair Transfer Assistive Devices: HOB elevated, Bed rails Bed/Chair Transfer: 4: Supine > Sit: Min A (  steadying Pt. > 75%/lift 1 leg), 4: Sit >  Supine: Min A (steadying pt. > 75%/lift 1 leg)  FIM - Locomotion: Wheelchair Distance: 50 Locomotion: Wheelchair: 0: Activity did not occur FIM - Locomotion: Ambulation Locomotion: Ambulation Assistive Devices: Administrator Ambulation/Gait Assistance: 4: Min assist Locomotion: Ambulation: 0: Activity did not occur  Comprehension Comprehension Mode: Auditory Comprehension: 4-Understands basic 75 - 89% of the time/requires cueing 10 - 24% of the time  Expression Expression Mode: Verbal Expression: 3-Expresses basic 50 - 74% of the time/requires cueing 25 - 50% of the time. Needs to repeat parts of sentences.  Social Interaction Social Interaction: 3-Interacts appropriately 50 - 74% of the time - May be physically or verbally inappropriate.  Problem Solving Problem Solving: 3-Solves basic 50 - 74% of the time/requires cueing 25 - 49% of the time  Memory Memory: 3-Recognizes or recalls 50 - 74% of the time/requires cueing 25 - 49% of the time Medical Problem List and Plan: 1. Functional deficits secondary to right frontal meningioma status post resection 05/08/2014  -more sedated over last 18 hours--can fluctuate.   -ua equivocal, culture pending  -follow up ct with improvement  -labwork unremarkable yesterday except for the fact that her Cr is slightly elevated.   -reduce meds. THT's are normal thus far 2. DVT Prophylaxis/Anticoagulation: Subcutaneous heparin initiated 05/22/2014. Monitor for any bleeding episodes 3. Pain Management: Hydrocodone as needed. Monitor with increased mobility 4. Mood/depression: Cymbalta 60 mg twice a day. Provide emotional support 5. Neuropsych: This patient is capable of making decisions on her own behalf. 6. Skin/Wound Care: Routine skin checks 7. Fluids/Electrolytes/Nutrition/ ongoing nausea: encourage po  -RD following  -poor po intake likely multifactorial--likely vestibular component also---vestibular evaluation  -megace--dc (doesn't like  taste). Resume reglan  -IVF hs 8. Seizure disorder. Keppra 500 mg 3 times a day, Vimpat 200 mg twice a day.    -consider neuro follow up if there is ongoing suspicion of absence type sz activity 9. Dysphagia. Dysphagia 3 nectar liquids. Follow speech therapy. Monitor hydration 10. Constipation with improvement, still with poor appetite/nausea-. Continue Mira lax twice a day.   -resume reglan 11. Hypertension. Clonidine patch 0.3 mg weekly, Lasix 40 mg twice a day. Markedly increased mobility 12 hypothyroidism. Synthroid 13. History of left breast cancer. Continue arimidex 14. Diabetes mellitus and peripheral neuropathy. Hemoglobin A1c 9.4. Sliding scale insulin. Patient on Liraglutide 18 mg subcutaneously daily prior to admission.  -began nph insulin to better control sugars for now---titrated to 10u bid. 15. History of gout. Colchicine 0.6 mg twice a day resumed 16. COPD. Continue Dulera 2 puffs twice daily. No shortness of breath 17. Tachycardia---likely volume and debility related-   -check bmet today  -start low dose atenolol  LOS (Days) 10 A FACE TO FACE EVALUATION WAS PERFORMED  SWARTZ,ZACHARY T 06/08/2014 8:45 AM

## 2014-06-08 NOTE — Progress Notes (Signed)
Occupational Therapy Note  Patient Details  Name: Morgan [name] MRN: [id number] Date of Birth: 1940-05-05  Today's Date: 06/08/2014 OT Missed Time: 48 Minutes Missed Time Reason: Patient ill (comment) (N/V; fatigue)  Pt resting in bed with RN attending to patient.  Pt continues to c/o N/V and fatigue.  Pt stated she was unable to participate in therapy this morning.   [name] [facility] 06/08/2014, 8:47 AM

## 2014-06-08 NOTE — Progress Notes (Signed)
Physical Therapy Weekly Progress Note  Patient Details  Name: Morgan [name] MRN: [id number] Date of Birth: 1941-03-07  Beginning of progress report period: May 30, 2014 End of progress report period: June 08, 2014  Today's Date: 06/08/2014 PT Individual Time: 4098-1191 PT Individual Time Calculation (min): 63 min   Patient has made slow progress and has met 0 of 5 short term goals.  Pt currently requires min-mod A overall and has had functional decline from last week secondary to limited ability to participate this past week due to multiple medical issues.  Pt D/C disposition is now SNF placement; goals downgraded or D/C accordingly.  Patient continues to demonstrate the following deficits: central vestibular impairments with dizziness, impaired activity tolerance/endurance, impaired strength, balance, gait and cognition and therefore will continue to benefit from skilled PT intervention to enhance overall performance with activity tolerance, balance, postural control, ability to compensate for deficits, attention and initiation.  Patient not progressing toward long term goals.  See goal revision..  Plan of care revisions: car, home ambulation and home w/c mobility D/C; all other goals downgraded.  PT Short Term Goals Week 1:  PT Short Term Goal 1 (Week 1): Pt will increase transfers supine to edge of bed. edge of bed to supine with S.  PT Short Term Goal 1 - Progress (Week 1): Not met PT Short Term Goal 2 (Week 1): Pt will increase transfers bed to chair, chair to bed with LRAD to min A.  PT Short Term Goal 2 - Progress (Week 1): Not met PT Short Term Goal 3 (Week 1): Pt will increase ambulation with LRAD about 50 feet with min A.  PT Short Term Goal 3 - Progress (Week 1): Progressing toward goal PT Short Term Goal 4 (Week 1): Pt will propel w/c with B LEs about 100 feet with S.  PT Short Term Goal 4 - Progress (Week 1): Not met PT Short Term Goal 5 (Week 1): Pt will  ascend/descend 4 stairs with B rails and min A.  PT Short Term Goal 5 - Progress (Week 1): Progressing toward goal Week 2:  PT Short Term Goal 1 (Week 2): = LTG which have been downgraded due to slow progress  Skilled Therapeutic Interventions/Progress Updates:   Pt received with daughter present.  Pt reporting mild dizziness but willing to participate in session.  HR initially 120.  Discussed goals with pt and daughter and goal attainment thus far as well as barriers to progress.  Reviewed compensation strategies for bed mobility and transfers with visual targeting. Pt performed rolling in bed to R side with visual targeting and supine > sit with min-mod A overall and minimal increase in dizziness.  Pt required increased time seated EOB to settle dizziness.  Performed transfer to w/c with RW and mod A to stand from bed.  Performed w/c mobility training with bilat UE (bilat LE unable to reach floor) with max A x 50' secondary to rotator cuff tears and weakness bilat UE.  Pt fatigued quickly.  Transported to gym total A in w/c and performed stair negotiation up/down 3 stairs with 2 rails and min A overall with verbal cues for safe step to sequence.  After stairs pt required prolonged rest break in w/c for deep breathing and to assess HR: 129 upon sitting.  Also performed gait training in hallway x 20' with RW and min A overall with second person following with w/c as pt fatigued with verbal and tactile cues for safe sequencing with  RW and for increased step and stride length bilaterally.  Pt requested to sit due to DOE; HR 130 upon sitting.  Returned to room in w/c and pt requesting to use BSC.  Observed and verbally guided daughter to assist pt w/c > BSC with RW and min A.  Daughter checked off to assist pt to [facility] but requested daughter call for NT assistance for hygiene and returning to w/c.  Daughter agreed.  Alerted NT that pt was on [facility] with daughter present to supervise.    Therapy  Documentation Precautions:  Precautions Precautions: Fall Precaution Comments: decreased sustained attention Restrictions Weight Bearing Restrictions: Yes Vital Signs: Therapy Vitals Temp: 97.6 F (36.4 C) Temp Source: Oral Pulse Rate: (!) 111 Resp: 17 BP: (!) 157/101 mmHg Patient Position (if appropriate): Lying Oxygen Therapy SpO2: (!) 89 % O2 Device: Not Delivered Pain: Pain Assessment Pain Assessment: No/denies pain Locomotion : Ambulation Ambulation/Gait Assistance: 4: Min assist Wheelchair Mobility Distance: 50   See FIM for current functional status  Therapy/Group: Individual Therapy  Raylene Everts [facility] 06/08/2014, 4:54 PM

## 2014-06-08 NOTE — Progress Notes (Signed)
Speech Language Pathology Daily Session Note  Patient Details  Name: Morgan [name] MRN: [id number] Date of Birth: 07-27-1940  Today's Date: 06/08/2014 SLP Individual Time: 1300-1400 SLP Individual Time Calculation (min): 60 min  Short Term Goals: Week 2: SLP Short Term Goal 1 (Week 2): Patient will consume current diet with minimal overt s/s of aspiration with Supervision A verbal cues for use of swallowing strategies  SLP Short Term Goal 2 (Week 2): Patient will keep her eyes open during functional tasks for 5 minutes with Mod A multimodal cues.  SLP Short Term Goal 3 (Week 2): Patient will recall new, daily information with Min A multimodal cues for use of external memory aids SLP Short Term Goal 4 (Week 2): Patient will demonstrate basic, functional problem solving with Mod A multimodal cues. SLP Short Term Goal 5 (Week 2): Patient will utilize an increased vocal intensity at the phrase level with Min A verbal cues.   Skilled Therapeutic Interventions: Skilled therapy session focused on cognitive goals. Upon arrival, RN present administering medication. Patient then repositioned to sitting upright in bed to facilitate attention and arousal during functional problem-solving card task. Student facilitated session by providing Mod A multimodal cues for functional problem-solving and Min A multimodal cues for use of external aid to assist in recall of rules to task. During session patient transferred to [facility] with NT and SLP assisting to facilitate toileting task and changing of clothes. Patient then transferred back to bed. Patient required Mod A multimodal cues to to keep eyes open for ~3 minutes during functional tasks and Min A multimodal cues for sustained attention in a mildly distracting environment for ~2 minutes. Patient handed off to PT. Continue with current plan of care.   FIM:  Comprehension Comprehension Mode: Auditory Comprehension: 4-Understands basic 75 - 89% of the time/requires  cueing 10 - 24% of the time Expression Expression Mode: Verbal Expression: 3-Expresses basic 50 - 74% of the time/requires cueing 25 - 50% of the time. Needs to repeat parts of sentences. Social Interaction Social Interaction: 3-Interacts appropriately 50 - 74% of the time - May be physically or verbally inappropriate. Problem Solving Problem Solving: 3-Solves basic 50 - 74% of the time/requires cueing 25 - 49% of the time Memory Memory: 3-Recognizes or recalls 50 - 74% of the time/requires cueing 25 - 49% of the time  Pain Pain Assessment Pain Assessment: No/denies pain  Therapy/Group: Individual Therapy  Servando Snare 06/08/2014, 2:22 PM

## 2014-06-08 NOTE — Progress Notes (Signed)
NUTRITION FOLLOW UP  DOCUMENTATION CODES Per approved criteria  -Obesity Unspecified   INTERVENTION: Continue Strawberry Ensure Complete po BID, each supplement provides 350 kcal and 13 grams of protein.  Continue Ensure Pudding po BID, each supplement provides 170 kcal and 4 grams of protein.  Provide 30 ml Prostat po TID, each supplement provides 100 kcal and 15 grams of protein.  Provide nourishment snacks (fruit cup, Magic cup, yogurt, cottage cheese).   Recommend obtaining new weight to fully assess weight trends.  Encourage adequate PO intake.  If PO intake continues to be poor, may need to consider enteral nutrition.  NUTRITION DIAGNOSIS: Inadequate oral intake related to decreased appetite as evidenced by varied meal completion of 0-80%; ongoing  Goal: Pt to meet >/= 90% of their estimated nutrition needs; not met  Monitor:  PO intake, weight trends, labs, I/O's  74 y.o. female  Admitting Dx: Meningioma  ASSESSMENT: Pt with history of hypertension, left breast cancer in remission 5 years, renal cancer with nephrectomy, diabetes mellitus with peripheral neuropathy and COPD. Admitted with altered mental status, seizure and left-sided weakness with difficulty in speech and gaze to the left. CT MRI and imaging showed evidence of enlarging poorly characterized mass at the high right frontal parietal region.   Pt with ongoing nausea and vomiting. Meal completion continue to be poor with 0-50%. Daughter present at bedside, she reports it has been difficult to get the pt to eat or drink her supplements as movement causes nausea. Pt was been able to sip on her supplements. Will continue with current orders. Daughter was also agreeable to Prostat. RD to order to aid in caloric and protein needs. RD to additionally order nourishment snacks (yogurt, cottage cheese).  Labs and medications reviewed.  Height: Ht Readings from Last 1 Encounters:  05/08/14 '5\' 4"'  (1.626 m)     Weight: Wt Readings from Last 1 Encounters:  05/19/14 221 lb 1.9 oz (100.3 kg)    BMI:  Body Mass Index: 37.87 kg/(m^2) Class II obesity  Re-Estimated Nutritional Needs: Kcal: 1700-1950 Protein: 95-100 grams Fluid: 1.7 - 1.95 L/day  Skin: Incision on head, +1 RUE edema  Diet Order: DIET DYS 3    Intake/Output Summary (Last 24 hours) at 06/08/14 1632 Last data filed at 06/08/14 1500  Gross per 24 hour  Intake    130 ml  Output    100 ml  Net     30 ml    Last BM: 2/28  Labs:   Recent Labs Lab 06/03/14 0705 06/04/14 0535  NA 131* 129*  K 4.8 4.0  CL 93* 94*  CO2 23 26  BUN 14 13  CREATININE 1.36* 1.26*  CALCIUM 9.0 8.4  GLUCOSE 190* 164*    CBG (last 3)   Recent Labs  06/07/14 2056 06/08/14 0659 06/08/14 1137  GLUCAP 135* 110* 122*    Scheduled Meds: . anastrozole  1 mg Oral Daily  . cloNIDine  0.3 mg Transdermal Weekly  . colchicine  0.6 mg Oral BID  . DULoxetine  60 mg Oral BID  . feeding supplement (ENSURE COMPLETE)  237 mL Oral BID BM  . feeding supplement (ENSURE)  1 Container Oral BID BM  . furosemide  40 mg Oral BID  . guaiFENesin  600 mg Oral BID  . heparin  5,000 Units Subcutaneous 3 times per day  . insulin aspart  0-20 Units Subcutaneous TID WC & HS  . insulin NPH Human  10 Units Subcutaneous BID AC & HS  .  lacosamide  200 mg Oral BID  . levETIRAcetam  500 mg Oral TID  . levothyroxine  200 mcg Oral Once per day on Mon Tue Wed Thu Fri Sat  . levothyroxine  300 mcg Oral Q Sun  . Liraglutide  1.8 mg Subcutaneous Daily  . metoCLOPramide  10 mg Oral TID AC & HS  . metoprolol tartrate  12.5 mg Oral BID  . mometasone-formoterol  2 puff Inhalation BID  . pantoprazole  40 mg Oral BID  . polyethylene glycol  17 g Oral BID  . scopolamine  1 patch Transdermal Q72H    Continuous Infusions: . sodium chloride 75 mL/hr at 06/06/14 2037    Past Medical History  Diagnosis Date  . Diabetes mellitus   . Obesity   . Diabetes type 2,  uncontrolled   . Hypoglycemia associated with diabetes   . Edema of both legs   . Hypertension   . Combined hyperlipidemia   . Acquired autoimmune hypothyroidism   . Thyroiditis, autoimmune   . Abnormal liver function tests   . Sleep apnea, obstructive   . History of gastroesophageal reflux (GERD)   . Depression   . DM neuropathy with neurologic complication   . COPD with acute exacerbation   . Goiter   . Fatigue   . Vertigo   . Type II diabetes mellitus with peripheral angiopathy   . Gout   . Pallor     Past Surgical History  Procedure Laterality Date  . Laparoscopic gastric banding    . Back surgery    . Craniotomy Right 05/08/2014    Procedure: CRANIOTOMY FOR MENINGIOMA;  Surgeon: Ashok Pall, MD;  Location: [facility];  Service: Neurosurgery;  Laterality: Right;  Right Craniotomy for meningioma  . Esophagogastroduodenoscopy (egd) with propofol N/A 05/22/2014    Procedure: ESOPHAGOGASTRODUODENOSCOPY (EGD) WITH PROPOFOL;  Surgeon: Wonda Horner, MD;  Location: [facility] ENDOSCOPY;  Service: Endoscopy;  Laterality: N/A;    Kallie Locks, MS, RD, LDN Pager # [phone] After hours/ weekend pager # [phone]

## 2014-06-08 NOTE — Progress Notes (Signed)
Continues with poor appetite. Sips of liquid intermittently. Getting HS IVF's. Tolerated scheduled meds, but declined megace. Reports the taste of megace makes her gag. PRN vicodin given at 2117, for C/O HA. Frequent cues and encouragement to keep eyes open during interactions. Tachy at rest. Morgan [name] A

## 2014-06-09 ENCOUNTER — Inpatient Hospital Stay (HOSPITAL_COMMUNITY): Payer: Medicare Other

## 2014-06-09 ENCOUNTER — Inpatient Hospital Stay (HOSPITAL_COMMUNITY): Payer: Medicare Other | Admitting: Physical Therapy

## 2014-06-09 ENCOUNTER — Inpatient Hospital Stay (HOSPITAL_COMMUNITY): Payer: Medicare Other | Admitting: Speech Pathology

## 2014-06-09 LAB — GLUCOSE, CAPILLARY
GLUCOSE-CAPILLARY: 201 mg/dL — AB (ref 70–99)
Glucose-Capillary: 125 mg/dL — ABNORMAL HIGH (ref 70–99)
Glucose-Capillary: 159 mg/dL — ABNORMAL HIGH (ref 70–99)
Glucose-Capillary: 177 mg/dL — ABNORMAL HIGH (ref 70–99)

## 2014-06-09 MED ORDER — METOPROLOL TARTRATE 25 MG PO TABS
25.0000 mg | ORAL_TABLET | Freq: Two times a day (BID) | ORAL | Status: DC
  Administered 2014-06-09 – 2014-06-10 (×3): 25 mg via ORAL
  Filled 2014-06-09 (×5): qty 1

## 2014-06-09 NOTE — Progress Notes (Signed)
Speech Language Pathology Daily Session Note  Patient Details  Name: Morgan [name] MRN: [id number] Date of Birth: 10-15-1940  Today's Date: 06/09/2014 SLP Individual Time: 1010-1110 SLP Individual Time Calculation (min): 60 min  Short Term Goals: Week 2: SLP Short Term Goal 1 (Week 2): Patient will consume current diet with minimal overt s/s of aspiration with Supervision A verbal cues for use of swallowing strategies  SLP Short Term Goal 2 (Week 2): Patient will keep her eyes open during functional tasks for 5 minutes with Mod A multimodal cues.  SLP Short Term Goal 3 (Week 2): Patient will recall new, daily information with Min A multimodal cues for use of external memory aids SLP Short Term Goal 4 (Week 2): Patient will demonstrate basic, functional problem solving with Mod A multimodal cues. SLP Short Term Goal 5 (Week 2): Patient will utilize an increased vocal intensity at the phrase level with Min A verbal cues.   Skilled Therapeutic Interventions: Skilled treatment session focused on cognitive goals. Upon arrival, patient was sitting upright in wheelchair with family member present, reporting fatigue but agreeable to participate in therapy. Patient taken to dayroom to facilitate attention and arousal during functional task of recalling and writing a familiar recipe. Student facilitated session by providing Min A multimodal cues to recall all necessary components of recipe and to sustain attention to task in a moderately distracting environment for ~90 seconds. Patient then transferred back to bed to address fatigue component of arousal. Patient required Max A multimodal cues to unlock brakes on wheelchair due patient keeping eyes closed during task. Student further facilitated session by providing Mod A multimodal cues for functional problem-solving and for recall of a previously taught problem-solving card task and Min A for sustained attention in a quiet environment and to keep eyes open  during task for ~2 minutes. Patient left supine in bed with 3 siderails raised, bed alarm on, soft callbell within reach, and family member present. Continue with current plan of care.   FIM:  Comprehension Comprehension Mode: Auditory Comprehension: 4-Understands basic 75 - 89% of the time/requires cueing 10 - 24% of the time Expression Expression Mode: Verbal Expression: 3-Expresses basic 50 - 74% of the time/requires cueing 25 - 50% of the time. Needs to repeat parts of sentences. Social Interaction Social Interaction: 3-Interacts appropriately 50 - 74% of the time - May be physically or verbally inappropriate. Problem Solving Problem Solving: 3-Solves basic 50 - 74% of the time/requires cueing 25 - 49% of the time Memory Memory: 3-Recognizes or recalls 50 - 74% of the time/requires cueing 25 - 49% of the time  Pain Pain Assessment Pain Assessment: No/denies pain  Therapy/Group: Individual Therapy  Servando Snare 06/09/2014, 12:03 PM

## 2014-06-09 NOTE — Plan of Care (Signed)
Problem: RH Problem Solving Goal: LTG Patient will demonstrate problem solving for (SLP) LTG: Patient will demonstrate problem solving for basic/complex daily situations with cues (SLP)  Downgraded on 3/1  Problem: RH Memory Goal: LTG Patient will use memory compensatory aids to (SLP) LTG: Patient will use memory compensatory aids to recall biographical/new, daily complex information with cues (SLP)  Downgraded on 3/1  Problem: RH Attention Goal: LTG Patient will demonstrate focused/sustained (SLP) LTG: Patient will demonstrate focused/sustained/selective/alternating/divided attention during cognitive/linguistic activities in specific environment with assist for # of minutes (SLP)  Downgraded 3/1  Problem: RH Awareness Goal: LTG: Patient will demonstrate intellectual/emergent (SLP) LTG: Patient will demonstrate intellectual/emergent/anticipatory awareness with assist during a cognitive/linguistic activity (SLP)  Downgraded 3/1

## 2014-06-09 NOTE — Progress Notes (Signed)
Occupational Therapy Session Note  Patient Details  Name: Morgan [name] MRN: [id number] Date of Birth: November 12, 1940  Today's Date: 06/09/2014 OT Individual Time: 0700-0800 OT Individual Time Calculation (min): 60 min    Short Term Goals: Week 2:  OT Short Term Goal 1 (Week 2): Pt will keep her eyes open 75% of ADL session demonstrating sustained attention OT Short Term Goal 2 (Week 2): Pt will perform LB bathing with min assist sit<>stand OT Short Term Goal 3 (Week 2): Pt will perform LB dressing with min assist sit<>stand OT Short Term Goal 4 (Week 2): Pt will perform toileting tasks with min A  Skilled Therapeutic Interventions/Progress Updates:    Pt engaged in BADL retraining including bathing and dressing with sit<>stand from w/c at sink.  Pt declined shower this morning but agreed to shower tomorrow morning.  Pt's sister-in-law present during session.  Pt performed supine->sit EOB using bed rails with supervision and amb with RW from bed to w/c (approx 5') with steady A.  Pt performed all sit<>stand at sink with steady A.  Pt continues to require extra time to complete tasks and multiple rest breaks throughout session.  Pt kept eyes open approx 50% of session.  Focus on activity tolerance, sit<>stand, standing balance, and safety awareness.  Therapy Documentation Precautions:  Precautions Precautions: Fall Precaution Comments: decreased sustained attention Restrictions Weight Bearing Restrictions: Yes  See FIM for current functional status  Therapy/Group: Individual Therapy  Leroy Libman 06/09/2014, 1:33 PM

## 2014-06-09 NOTE — Plan of Care (Signed)
Problem: RH Balance Goal: LTG Patient will maintain dynamic standing with ADLs (OT) LTG: Patient will maintain dynamic standing balance with assist during activities of daily living (OT)  Downgraded 3/1 secondary to slow progress

## 2014-06-09 NOTE — Plan of Care (Signed)
Problem: RH Bathing Goal: LTG Patient will bathe with assist, cues/equipment (OT) LTG: Patient will bathe specified number of body parts with assist with/without cues using equipment (position) (OT)  Downgraded 3/1 due to slow progress   Problem: RH Dressing Goal: LTG Patient will perform lower body dressing w/assist (OT) LTG: Patient will perform lower body dressing with assist, with/without cues in positioning using equipment (OT)  Downgraded 3/1 due to slow progress   Problem: RH Toileting Goal: LTG Patient will perform toileting w/assist, cues/equip (OT) LTG: Patient will perform toiletiing (clothes management/hygiene) with assist, with/without cues using equipment (OT)  Downgraded 3/1 due to slow progress   Problem: RH Simple Meal Prep Goal: LTG Patient will perform simple meal prep w/assist (OT) LTG: Patient will perform simple meal prep with assistance, with/without cues (OT).  Goal discharged 3/1 secondary to discharge to SNF  Problem: RH Toilet Transfers Goal: LTG Patient will perform toilet transfers w/assist (OT) LTG: Patient will perform toilet transfers with assist, with/without cues using equipment (OT)  Downgraded 3/1 due to slow progress   Problem: RH Tub/Shower Transfers Goal: LTG Patient will perform tub/shower transfers w/assist (OT) LTG: Patient will perform tub/shower transfers with assist, with/without cues using equipment (OT)  Downgraded 3/1 due to slow progress

## 2014-06-09 NOTE — Progress Notes (Signed)
[place] PHYSICAL MEDICINE & REHABILITATION     PROGRESS NOTE    Subjective/Complaints: Still didn't eat much yesterday. Perhaps feeling a little better today. Objective: Vital Signs: Blood pressure 149/86, pulse 118, temperature 97.7 F (36.5 C), temperature source Axillary, resp. rate 17, SpO2 96 %. No results found. No results for input(s): WBC, HGB, HCT, PLT in the last 72 hours.  Recent Labs  06/08/14 1550  NA 128*  K 4.0  CL 98  GLUCOSE 139*  BUN 13  CREATININE 1.30*  CALCIUM 8.7   CBG (last 3)   Recent Labs  06/08/14 1656 06/08/14 2046 06/09/14 0635  GLUCAP 134* 109* 125*    Wt Readings from Last 3 Encounters:  05/19/14 100.3 kg (221 lb 1.9 oz)  01/04/12 109.09 kg (240 lb 8 oz)  09/11/11 110.496 kg (243 lb 9.6 oz)    Physical Exam:  No distressed. More alert  HENT: mild thrush on sides of tongue Craniotomy site well-healed. No edema. ?nystagmus laterally---keeps eyes closed  Eyes: Conjunctivae and EOM are normal. Pupils are equal, round, and reactive to light.  Pupils round and reactive to light without nystagmus  Neck: Normal range of motion. Neck supple. No JVD present. No tracheal deviation present. No thyromegaly present.  Cardiovascular: Normal rate and regular rhythm.  Respiratory: Effort normal and breath sounds normal. No respiratory distress. She has no wheezes. She has no rales.  GI: Soft. Bowel sounds are normal. She exhibits no distension. There is no tenderness. There is no rebound.  Musculoskeletal:  She moves all extremities  Lymphadenopathy:   She has no cervical adenopathy.  Neurological:  Mood is appropriate.  Oriented to place, reason she's at hospital. f She was able to provide her name, age. UES: grossly 3-4/5 proximal to distal UE's. HF 3/5, KE 4-, ADF/APF 4+. Senses pain in all 4's. Decreased [place] on left.  Psychiatric: She has a normal mood and affect--more dynamic. Her behavior is normal.   Assessment/Plan: 1. Functional  deficits secondary to right meningioma s/p resection which require 3+ hours per day of interdisciplinary therapy in a comprehensive inpatient rehab setting. Physiatrist is providing close team supervision and 24 hour management of active medical problems listed below. Physiatrist and rehab team continue to assess barriers to discharge/monitor patient progress toward functional and medical goals. Tachycardia- check EKG if afib then cardiology consult , if sinus tach look for underlying cause- given age and body habitus deconditioning may be a factor FIM: FIM - Bathing Bathing Steps Patient Completed: Chest, Right Arm, Left Arm, Abdomen, Front perineal area, Right upper leg, Left upper leg Bathing: 3: Mod-Patient completes 5-7 46f10 parts or 50-74%  FIM - Upper Body Dressing/Undressing Upper body dressing/undressing steps patient completed: Thread/unthread right sleeve of pullover shirt/dresss, Thread/unthread left sleeve of pullover shirt/dress, Put head through opening of pull over shirt/dress Upper body dressing/undressing: 4: Min-Patient completed 75 plus % of tasks FIM - Lower Body Dressing/Undressing Lower body dressing/undressing steps patient completed: Thread/unthread right underwear leg, Thread/unthread left underwear leg, Thread/unthread right pants leg, Pull pants up/down, Thread/unthread left pants leg, Pull underwear up/down Lower body dressing/undressing: 3: Mod-Patient completed 50-74% of tasks  FIM - Toileting Toileting steps completed by patient: Performs perineal hygiene Toileting: 1: Total-Patient completed zero steps, helper did all 3  FIM - TRadio producerDevices: Bedside commode, WInsurance account managerTransfers: 4-From toilet/BSC: Min A (steadying Pt. > 75%), 4-To toilet/BSC: Min A (steadying Pt. > 75%)  FIM - BControl and instrumentation engineer  Devices: HOB elevated, Bed rails, Arm rests, Copy: 5: Supine > Sit:  Supervision (verbal cues/safety issues), 3: Bed > Chair or W/C: Mod A (lift or lower assist)  FIM - Locomotion: Wheelchair Distance: 50 Locomotion: Wheelchair: 2: Travels 50 - 149 ft with maximal assistance (Pt: 25 - 49%) FIM - Locomotion: Ambulation Locomotion: Ambulation Assistive Devices: Administrator Ambulation/Gait Assistance: 4: Min assist Locomotion: Ambulation: 1: Travels less than 50 ft with minimal assistance (Pt.>75%)  Comprehension Comprehension Mode: Auditory Comprehension: 4-Understands basic 75 - 89% of the time/requires cueing 10 - 24% of the time  Expression Expression Mode: Verbal Expression: 3-Expresses basic 50 - 74% of the time/requires cueing 25 - 50% of the time. Needs to repeat parts of sentences.  Social Interaction Social Interaction: 3-Interacts appropriately 50 - 74% of the time - May be physically or verbally inappropriate.  Problem Solving Problem Solving: 3-Solves basic 50 - 74% of the time/requires cueing 25 - 49% of the time  Memory Memory: 3-Recognizes or recalls 50 - 74% of the time/requires cueing 25 - 49% of the time Medical Problem List and Plan: 1. Functional deficits secondary to right frontal meningioma status post resection 05/08/2014  -more sedated over last 18 hours--can fluctuate.   -ua equivocal, culture pending  -follow up ct with improvement  -labwork unremarkable yesterday except for the fact that her Cr is slightly elevated.   -reduce meds. THT's are normal thus far 2. DVT Prophylaxis/Anticoagulation: Subcutaneous heparin initiated 05/22/2014. Monitor for any bleeding episodes 3. Pain Management: Hydrocodone as needed. Monitor with increased mobility 4. Mood/depression: Cymbalta 60 mg twice a day. Provide emotional support 5. Neuropsych: This patient is capable of making decisions on her own behalf. 6. Skin/Wound Care: Routine skin checks 7. Fluids/Electrolytes/Nutrition/ ongoing nausea: encourage po---needs to be doing  better with this prior to dc  -RD following  -poor po intake likely multifactorial--likely vestibular component also---vestibular evaluation  -megace--dc'ed (doesn't like taste). Resumed reglan  -IVF hs 8. Seizure disorder. Keppra 500 mg 3 times a day, Vimpat 200 mg twice a day.    -consider neuro follow up if there is ongoing suspicion of absence type sz activity 9. Dysphagia. Dysphagia 3 nectar liquids. Follow speech therapy. Monitor hydration 10. Constipation with improvement, still with poor appetite/nausea-. Continue Mira lax twice a day.   -resume reglan 11. Hypertension. Clonidine patch 0.3 mg weekly, Lasix 40 mg twice a day. Markedly increased mobility 12 hypothyroidism. Synthroid 13. History of left breast cancer. Continue arimidex 14. Diabetes mellitus and peripheral neuropathy. Hemoglobin A1c 9.4. Sliding scale insulin. Patient on Liraglutide 18 mg subcutaneously daily prior to admission.  -began nph insulin to better control sugars for now---titrated to 10u bid. 15. History of gout. Colchicine 0.6 mg twice a day resumed 16. COPD. Continue Dulera 2 puffs twice daily. No shortness of breath 17. Tachycardia---likely volume and debility related-   -bmet noted  -on lopressor 163m bid at home---increase to 235mbid today  LOS (Days) 11 A FACE TO FACE EVALUATION WAS PERFORMED  SWARTZ,ZACHARY T 06/09/2014 8:03 AM

## 2014-06-09 NOTE — Progress Notes (Signed)
Physical Therapy Session Note  Patient Details  Name: Morgan [name] MRN: [id number] Date of Birth: Mar 27, 1941  Today's Date: 06/09/2014 PT Individual Time: 9628-3662 PT Individual Time Calculation (min): 66 min   Short Term Goals: Week 1:  PT Short Term Goal 1 (Week 1): Pt will increase transfers supine to edge of bed. edge of bed to supine with S.  PT Short Term Goal 1 - Progress (Week 1): Not met PT Short Term Goal 2 (Week 1): Pt will increase transfers bed to chair, chair to bed with LRAD to min A.  PT Short Term Goal 2 - Progress (Week 1): Not met PT Short Term Goal 3 (Week 1): Pt will increase ambulation with LRAD about 50 feet with min A.  PT Short Term Goal 3 - Progress (Week 1): Progressing toward goal PT Short Term Goal 4 (Week 1): Pt will propel w/c with B LEs about 100 feet with S.  PT Short Term Goal 4 - Progress (Week 1): Not met PT Short Term Goal 5 (Week 1): Pt will ascend/descend 4 stairs with B rails and min A.  PT Short Term Goal 5 - Progress (Week 1): Progressing toward goal Week 2:  PT Short Term Goal 1 (Week 2): = LTG which have been downgraded due to slow progress  Skilled Therapeutic Interventions/Progress Updates:   Pt received in room on [facility]; pt attempting to have BM.  Pt unable to have BM but did urinate.  Pt able to stand with min A and stand with min A and alternating UE support on RW to perform hygiene and don clothing.  After seated rest break pt ambulated to sink to wash hands but pt became very fatigue leaned on sink with LUE and was unable to stand fully upright to use and wash LUE despite verbal and tactile cues.  Once seated in w/c, HR and Sp02 assessed.  While being transported to gym pt reported urgent need to use bathroom.  Transported to bathroom in w/c and ambulated w/c <> toilet with RW and mod A to safely negotiate RW; also required verbal cues to maintain safe distance to RW and for full pivot prior to sitting.  Pt required total A for toileting due  to urgency and fatigue and pt noted to have diarrhea.  Pt unable to stand this time for hygiene due to fatigue.  In gym attempted to use [place] in standing to address attention to task, initiation, problem solving/sequencing, standing balance, endurance, attention to L and use of LUE but pt unable to remain standing due to fatigue.  Attempted to have pt select correct pieces for pattern selected but pt became perseverative on fitting two corners together and unable to select correct piece even when given option between two pieces.  Returned to room and transferred to bed and sit > supine with mod A.  Pt very fatigued but denied any dizziness during session.      Therapy Documentation Precautions:  Precautions Precautions: Fall Precaution Comments: decreased sustained attention Restrictions Weight Bearing Restrictions: Yes Vital Signs: Therapy Vitals Pulse Rate: (!) 117 Patient Position (if appropriate): Sitting Pain: Pain Assessment Pain Assessment: No/denies pain Locomotion : Ambulation Ambulation/Gait Assistance: 4: Min assist;3: Mod assist   See FIM for current functional status  Therapy/Group: Individual Therapy  Raylene Everts [facility] 06/09/2014, 4:52 PM

## 2014-06-10 ENCOUNTER — Inpatient Hospital Stay (HOSPITAL_COMMUNITY): Payer: Medicare Other

## 2014-06-10 ENCOUNTER — Inpatient Hospital Stay (HOSPITAL_COMMUNITY): Payer: Medicare Other | Admitting: *Deleted

## 2014-06-10 ENCOUNTER — Inpatient Hospital Stay (HOSPITAL_COMMUNITY): Payer: Medicare Other | Admitting: Occupational Therapy

## 2014-06-10 DIAGNOSIS — R000 Tachycardia, unspecified: Secondary | ICD-10-CM

## 2014-06-10 LAB — CBC WITH DIFFERENTIAL/PLATELET
BASOS ABS: 0.1 10*3/uL (ref 0.0–0.1)
BASOS PCT: 1 % (ref 0–1)
EOS ABS: 0.1 10*3/uL (ref 0.0–0.7)
Eosinophils Relative: 1 % (ref 0–5)
HEMATOCRIT: 35 % — AB (ref 36.0–46.0)
HEMOGLOBIN: 12.2 g/dL (ref 12.0–15.0)
LYMPHS PCT: 34 % (ref 12–46)
Lymphs Abs: 2.4 10*3/uL (ref 0.7–4.0)
MCH: 30.7 pg (ref 26.0–34.0)
MCHC: 34.9 g/dL (ref 30.0–36.0)
MCV: 87.9 fL (ref 78.0–100.0)
Monocytes Absolute: 0.6 10*3/uL (ref 0.1–1.0)
Monocytes Relative: 9 % (ref 3–12)
NEUTROS ABS: 3.9 10*3/uL (ref 1.7–7.7)
NEUTROS PCT: 55 % (ref 43–77)
Platelets: 336 10*3/uL (ref 150–400)
RBC: 3.98 MIL/uL (ref 3.87–5.11)
RDW: 14.4 % (ref 11.5–15.5)
WBC: 7.1 10*3/uL (ref 4.0–10.5)

## 2014-06-10 LAB — BASIC METABOLIC PANEL
Anion gap: 11 (ref 5–15)
BUN: 17 mg/dL (ref 6–23)
CHLORIDE: 94 mmol/L — AB (ref 96–112)
CO2: 22 mmol/L (ref 19–32)
Calcium: 9 mg/dL (ref 8.4–10.5)
Creatinine, Ser: 1.54 mg/dL — ABNORMAL HIGH (ref 0.50–1.10)
GFR, EST AFRICAN AMERICAN: 37 mL/min — AB (ref >90)
GFR, EST NON AFRICAN AMERICAN: 32 mL/min — AB (ref >90)
Glucose, Bld: 276 mg/dL — ABNORMAL HIGH (ref 70–99)
POTASSIUM: 4.1 mmol/L (ref 3.5–5.1)
SODIUM: 127 mmol/L — AB (ref 135–145)

## 2014-06-10 LAB — GLUCOSE, CAPILLARY
GLUCOSE-CAPILLARY: 322 mg/dL — AB (ref 70–99)
Glucose-Capillary: 191 mg/dL — ABNORMAL HIGH (ref 70–99)
Glucose-Capillary: 78 mg/dL (ref 70–99)
Glucose-Capillary: 199 mg/dL — ABNORMAL HIGH (ref 70–99)

## 2014-06-10 MED ORDER — METOPROLOL TARTRATE 50 MG PO TABS
50.0000 mg | ORAL_TABLET | Freq: Two times a day (BID) | ORAL | Status: DC
  Administered 2014-06-10 – 2014-06-11 (×2): 50 mg via ORAL
  Filled 2014-06-10 (×4): qty 1

## 2014-06-10 MED ORDER — METOCLOPRAMIDE HCL 5 MG PO TABS
5.0000 mg | ORAL_TABLET | Freq: Three times a day (TID) | ORAL | Status: DC
  Administered 2014-06-10 – 2014-06-11 (×4): 5 mg via ORAL
  Filled 2014-06-10 (×9): qty 1

## 2014-06-10 NOTE — Progress Notes (Signed)
Physical Therapy Session Note  Patient Details  Name: Morgan [name] MRN: [id number] Date of Birth: 09/13/1940  Today's Date: 06/10/2014 PT Individual Time: 1500-1600 PT Individual Time Calculation (min): 60 min   Short Term Goals: Week 2:  PT Short Term Goal 1 (Week 2): = LTG which have been downgraded due to slow progress    Skilled Therapeutic Interventions/Progress Updates:  Tx focused on attention, keeping eyes open, therapeutic activities in sitting and standing, w/c propulsion, orientation.  Pt's daughter stated pt was more confused as she had just waked up.  Pt stated her daughter was her mother, and that she was leaving the room to go "to the den and see my husband".  Bed mobility and basic transfer with min assist.   W/c propulsion x 25' using bil UEs , supervision/min assist.  Sit>< stand from w/c and armchair, supervision > min assist.  Gait on carpet x 10' to R and 10' to L with min assist to slide RW sideways.  Therapeutic activity in standing with RW, checking houseplants for dryness with L hand, and using small watering can appropriately with R hand.  Pt perseverated when checking plants, needing min cues to move on to next one.  Pt required 1 seated rest break during each 10' distance, due to dyspnea 3/4.  HR 113 O2 sats 98% in sitting after activity. Pt had 3 LOB forward during this activity, regained balance independently.  Pt stated she was exhausted, and began to close eyes.  With next therapeutic activity in sitting, beach ball volleys, pt attended and used bil hands overhead and forward, x 20 taps x 2.  Returned to room where pt was urged to stay OOB until after dinner.  Daughter [name] in room.  Quick release belt applied and all needs in place.    Therapy Documentation Precautions:  Precautions Precautions: Fall Precaution Comments: decreased sustained attention Restrictions Weight Bearing Restrictions: Yes   Vital Signs: Pulse Rate: (!) 112 Patient Position  (if appropriate): sitting Oxygen Therapy SpO2: 98 % O2 Device: Not Delivered      See FIM for current functional status  Therapy/Group: Individual Therapy  Keric Zehren 06/10/2014, 4:23 PM

## 2014-06-10 NOTE — Progress Notes (Signed)
Occupational Therapy Session Note  Patient Details  Name: Morgan [name] MRN: [id number] Date of Birth: 04-Nov-1940  Today's Date: 06/10/2014 OT Individual Time: [id number] OT Individual Time Calculation (min): 60 min    Short Term Goals: Week 2:  OT Short Term Goal 1 (Week 2): Pt will keep her eyes open 75% of ADL session demonstrating sustained attention OT Short Term Goal 2 (Week 2): Pt will perform LB bathing with min assist sit<>stand OT Short Term Goal 3 (Week 2): Pt will perform LB dressing with min assist sit<>stand OT Short Term Goal 4 (Week 2): Pt will perform toileting tasks with min A  Skilled Therapeutic Interventions/Progress Updates:  Upon entering the room, pt supine in bed resting with daughter present in the room. Pt with no c/o pain this session. Skilled OT session with focus on self care retraining, energy conservation, pt/family education, STS, functional mobility, and functional transfers. Pt requiring increased time and verbal cues for initiation of tasks during session. Pt with eyes closed multiple times during session and most often towards end of session as pt becoming more fatigued. Pt ambulated into bathroom with Mod A and use of RW for Mod A transfer onto shower chair. Bathing with Mod A as well. Pt standing with Min A and use of grab bar when washing buttocks. Pt ambulating to sit on EOB with Mod A for LB dressing and Min A for UB dressing. Pt with increased fatigue and heavy breathing throughout session. O2 stats remaining at 97-98% but HR increased to 110 with ambulation. Pt requesting to lay supine in bed and eat breakfast but with encouragement and education pt agreeable to sit in wheelchair to eat. Family remains present and call bell within reach upon exiting the room.   Therapy Documentation Precautions:  Precautions Precautions: Fall Precaution Comments: decreased sustained attention Restrictions Weight Bearing Restrictions: Yes Vital Signs: Oxygen  Therapy O2 Device: Not Delivered Pain: Pain Assessment Pain Assessment: No/denies pain  See FIM for current functional status  Therapy/Group: Individual Therapy  Phineas Semen 06/10/2014, 11:40 AM

## 2014-06-10 NOTE — Progress Notes (Signed)
[place] PHYSICAL MEDICINE & REHABILITATION     PROGRESS NOTE    Subjective/Complaints: Ate much better yesterday. Has had some confusion since last evening. Still thinks her dog is in the bed with her this am. Objective: Vital Signs: Blood pressure 140/74, pulse 109, temperature 97.7 F (36.5 C), temperature source Oral, resp. rate 18, SpO2 98 %. No results found. No results for input(s): WBC, HGB, HCT, PLT in the last 72 hours.  Recent Labs  06/08/14 1550  NA 128*  K 4.0  CL 98  GLUCOSE 139*  BUN 13  CREATININE 1.30*  CALCIUM 8.7   CBG (last 3)   Recent Labs  06/09/14 1637 06/09/14 2032 06/10/14 0638  GLUCAP 177* 201* 191*    Wt Readings from Last 3 Encounters:  05/19/14 100.3 kg (221 lb 1.9 oz)  01/04/12 109.09 kg (240 lb 8 oz)  09/11/11 110.496 kg (243 lb 9.6 oz)    Physical Exam:  No distressed. More alert  HENT: mild thrush on sides of tongue Craniotomy site well-healed. No edema. ?nystagmus laterally---keeps eyes closed  Eyes: Conjunctivae and EOM are normal. Pupils are equal, round, and reactive to light.  Pupils round and reactive to light without nystagmus  Neck: Normal range of motion. Neck supple. No JVD present. No tracheal deviation present. No thyromegaly present.  Cardiovascular: Normal rate and regular rhythm.  Respiratory: Effort normal and breath sounds normal. No respiratory distress. She has no wheezes. She has no rales.  GI: Soft. Bowel sounds are normal. She exhibits no distension. There is no tenderness. There is no rebound.  Musculoskeletal:  She moves all extremities  Lymphadenopathy:   She has no cervical adenopathy.  Neurological:  Mood is appropriate.  Oriented to place, reason she's at hospital. She was able to provide her name, age. UES: grossly 3-4/5 proximal to distal UE's. HF 3/5, KE 4-, ADF/APF 4+. Senses pain in all 4's. Decreased [place] on left.  Psychiatric: She has a normal mood and affect--more dynamic. Her behavior  is normal.   Assessment/Plan: 1. Functional deficits secondary to right meningioma s/p resection which require 3+ hours per day of interdisciplinary therapy in a comprehensive inpatient rehab setting. Physiatrist is providing close team supervision and 24 hour management of active medical problems listed below. Physiatrist and rehab team continue to assess barriers to discharge/monitor patient progress toward functional and medical goals. Tachycardia- check EKG if afib then cardiology consult , if sinus tach look for underlying cause- given age and body habitus deconditioning may be a factor  FIM: FIM - Bathing Bathing Steps Patient Completed: Chest, Right Arm, Left Arm, Abdomen, Front perineal area, Right upper leg, Left upper leg Bathing: 3: Mod-Patient completes 5-7 31f10 parts or 50-74%  FIM - Upper Body Dressing/Undressing Upper body dressing/undressing steps patient completed: Thread/unthread right sleeve of pullover shirt/dresss, Thread/unthread left sleeve of pullover shirt/dress, Put head through opening of pull over shirt/dress Upper body dressing/undressing: 4: Min-Patient completed 75 plus % of tasks FIM - Lower Body Dressing/Undressing Lower body dressing/undressing steps patient completed: Thread/unthread right underwear leg, Thread/unthread left underwear leg, Thread/unthread right pants leg, Pull pants up/down, Thread/unthread left pants leg, Pull underwear up/down Lower body dressing/undressing: 3: Mod-Patient completed 50-74% of tasks  FIM - Toileting Toileting steps completed by patient: Performs perineal hygiene Toileting: 2: Max-Patient completed 1 of 3 steps  FIM - TRadio producerDevices: WEnvironmental consultant BRecruitment consultantTransfers: 3-To toilet/BSC: Mod A (lift or lower assist), 3-From toilet/BSC: Mod A (lift or  lower assist)  FIM - Bed/Chair Transfer Bed/Chair Transfer Assistive Devices: Walker Bed/Chair Transfer: 3: Sit > Supine: Mod A  (lifting assist/Pt. 50-74%/lift 2 legs), 3: Bed > Chair or W/C: Mod A (lift or lower assist), 3: Chair or W/C > Bed: Mod A (lift or lower assist)  FIM - Locomotion: Wheelchair Distance: 50 Locomotion: Wheelchair: 1: Total Assistance/staff pushes wheelchair (Pt<25%) FIM - Locomotion: Ambulation Locomotion: Ambulation Assistive Devices: Administrator Ambulation/Gait Assistance: 4: Min assist, 3: Mod assist Locomotion: Ambulation: 1: Travels less than 50 ft with moderate assistance (Pt: 50 - 74%)  Comprehension Comprehension Mode: Auditory Comprehension: 4-Understands basic 75 - 89% of the time/requires cueing 10 - 24% of the time  Expression Expression Mode: Verbal Expression: 3-Expresses basic 50 - 74% of the time/requires cueing 25 - 50% of the time. Needs to repeat parts of sentences.  Social Interaction Social Interaction: 3-Interacts appropriately 50 - 74% of the time - May be physically or verbally inappropriate.  Problem Solving Problem Solving: 3-Solves basic 50 - 74% of the time/requires cueing 25 - 49% of the time  Memory Memory: 3-Recognizes or recalls 50 - 74% of the time/requires cueing 25 - 49% of the time Medical Problem List and Plan: 1. Functional deficits secondary to right frontal meningioma status post resection 05/08/2014  -watch mental status today. Has had some fluctuation  2. DVT Prophylaxis/Anticoagulation: Subcutaneous heparin initiated 05/22/2014.   3. Pain Management: Hydrocodone as needed---limit due to mentation 4. Mood/depression: Cymbalta 60 mg twice a day. Provide emotional support 5. Neuropsych: This patient is capable of making decisions on her own behalf. 6. Skin/Wound Care: Routine skin checks 7. Fluids/Electrolytes/Nutrition/ ongoing nausea: encourage po---needs to be doing better with this prior to dc  -RD following  -poor po intake likely multifactorial--likely vestibular component also---vestibular evaluation  -will reduce reglan dose  (?factoring in confusion)  -IVF hs 8. Seizure disorder. Keppra 500 mg 3 times a day, Vimpat 200 mg twice a day.    -consider neuro follow up if there is ongoing suspicion of absence type sz activity 9. Dysphagia. Dysphagia 3 nectar liquids. Follow speech therapy. Monitor hydration 10. Constipation with improvement, still with poor appetite/nausea-. Continue Mira lax twice a day.   - reglan 11. Hypertension. Clonidine patch 0.3 mg weekly, Lasix 40 mg twice a day. Markedly increased mobility 12 hypothyroidism. Synthroid 13. History of left breast cancer. Continue arimidex 14. Diabetes mellitus and peripheral neuropathy. Hemoglobin A1c 9.4. Sliding scale insulin. Patient on Liraglutide 18 mg subcutaneously daily prior to admission.  -began nph insulin to better control sugars for now---titrated to 10u bid with some improvement. Titrate further if po intake is more consistent 15. History of gout. Colchicine 0.6 mg twice a day resumed 16. COPD. Continue Dulera 2 puffs twice daily. No shortness of breath 17. Tachycardia--    -bmet noted  -on lopressor 138m bid at home---increase to 50 mg bid today bp permitting  LOS (Days) 12 A FACE TO FACE EVALUATION WAS PERFORMED  Renny Remer T 06/10/2014 9:14 AM

## 2014-06-10 NOTE — Progress Notes (Signed)
Physical Therapy Session Note  Patient Details  Name: Morgan [name] MRN: [id number] Date of Birth: 11-Dec-1940  Today's Date: 06/10/2014 PT Individual Time: 1000-1100 PT Individual Time Calculation (min): 60 min   Short Term Goals: Week 2:  PT Short Term Goal 1 (Week 2): = LTG which have been downgraded due to slow progress  Skilled Therapeutic Interventions/Progress Updates:    Pt received seated in w/c handed off from SLP/RT, agreeable to participate in therapy. Pt seen for make up time. Session focused on functional endurance, w/c propulsion, point to point ambulation. Instructed pt in propelling w/c on flat level surface w/ overall SBA, pt propels chair with short strokes w/ BUE due to bilateral rotator cuff pathology limiting shoulder extension and internal rotation. Pt required HOH assist on L for straight ahead propulsion without evidence of carryover. Instructed pt in point to point walking 2x10' then 2x15' from w/c to standard chair with arm rests. Pt required Min guard A to move sit<>stand and MinA to ambulate, pt's gait characterized by shuffling and intermittent scissoring gait. Instructed pt in pipe tree puzzle w/ pt completing 2 simple patterns w/ max instructional cueing fading to min instructional cueing with practice. Instructed pt in leaning and reaching task to cut up cornbread prepared during previous session. SLP present for pt consuming cornbread and cleared her with D3 diet with technique of alternating bites of cornbread with sip of water. Pt transported baqck to room via [place] due to fatigue. Pt left seated in w/c w/ quick release  Belt on, daughter present and all needs within reach.   Therapy Documentation Precautions:  Precautions Precautions: Fall Precaution Comments: decreased sustained attention Restrictions Weight Bearing Restrictions: Yes Pain:  No/denies pain  See FIM for current functional status  Therapy/Group: Individual Therapy  Rada Hay 06/10/2014, 10:17 AM

## 2014-06-10 NOTE — Progress Notes (Signed)
Speech Language Pathology Daily Session Note  Patient Details  Name: Morgan [name] MRN: [id number] Date of Birth: 1941/03/07  Today's Date: 06/10/2014 SLP Individual Time: 0900-1005 SLP Individual Time Calculation (min): 65 min  Short Term Goals: Week 2: SLP Short Term Goal 1 (Week 2): Patient will consume current diet with minimal overt s/s of aspiration with Supervision A verbal cues for use of swallowing strategies  SLP Short Term Goal 2 (Week 2): Patient will keep her eyes open during functional tasks for 5 minutes with Mod A multimodal cues.  SLP Short Term Goal 3 (Week 2): Patient will recall new, daily information with Min A multimodal cues for use of external memory aids SLP Short Term Goal 4 (Week 2): Patient will demonstrate basic, functional problem solving with Mod A multimodal cues. SLP Short Term Goal 5 (Week 2): Patient will utilize an increased vocal intensity at the phrase level with Min A verbal cues.   Skilled Therapeutic Interventions: Skilled co-treatment session with recreation therapist present to facilitate therapeutic cooking activity focused on cognitive goals. Upon arrival, patient was upright in wheelchair with RN administering morning medication. Patient then transferred to therapy kitchen to begin functional, familiar task of cooking previously recalled recipe. Student facilitated session by providing Min A multimodal cues for selective attention in a quiet environment for ~10 minutes and Mod A multimodal cues for problem-solving, monitoring of errors, and planning and implementing steps of recipe. Patient required Supervision A multimodal cues to keep eyes open for ~20 minutes during session, demonstrating marked improvement from previous sessions with this student. Patient handed off to PT. Continue with current plan of care.   FIM:  Comprehension Comprehension Mode: Auditory Comprehension: 4-Understands basic 75 - 89% of the time/requires cueing 10 - 24% of the  time Expression Expression Mode: Verbal Expression: 3-Expresses basic 50 - 74% of the time/requires cueing 25 - 50% of the time. Needs to repeat parts of sentences. Social Interaction Social Interaction: 4-Interacts appropriately 75 - 89% of the time - Needs redirection for appropriate language or to initiate interaction. Problem Solving Problem Solving: 3-Solves basic 50 - 74% of the time/requires cueing 25 - 49% of the time Memory Memory: 3-Recognizes or recalls 50 - 74% of the time/requires cueing 25 - 49% of the time  Pain Pain Assessment Pain Assessment: No/denies pain  Therapy/Group: Individual Therapy  Servando Snare 06/10/2014, 11:45 AM

## 2014-06-11 ENCOUNTER — Inpatient Hospital Stay (HOSPITAL_COMMUNITY): Payer: Medicare Other

## 2014-06-11 ENCOUNTER — Inpatient Hospital Stay (HOSPITAL_COMMUNITY): Payer: Medicare Other | Admitting: Speech Pathology

## 2014-06-11 LAB — GLUCOSE, CAPILLARY
GLUCOSE-CAPILLARY: 144 mg/dL — AB (ref 70–99)
GLUCOSE-CAPILLARY: 334 mg/dL — AB (ref 70–99)
Glucose-Capillary: 88 mg/dL (ref 70–99)

## 2014-06-11 MED ORDER — INSULIN NPH (HUMAN) (ISOPHANE) 100 UNIT/ML ~~LOC~~ SUSP: 12.0000 [IU] | Freq: Two times a day (BID) | SUBCUTANEOUS | Status: DC

## 2014-06-11 NOTE — Progress Notes (Signed)
Occupational Therapy Discharge Summary  Patient Details  Name: Morgan [name] MRN: [id number] Date of Birth: 04/24/1940   Patient has met 7 of 9 long term goals due to improved activity tolerance, improved balance, postural control, ability to compensate for deficits, improved attention, improved awareness and improved coordination.  Pt has made inconsistent progress with BADLs during this admission.  Pt currently requires mod A for bathing and LB dressing tasks.  Pt is min A for functional transfers. Pt continues to keep eyes closed for approx 75% of each session and requires min verbal cues to open eyes to increase ability to complete BADLs.  Pt fatigues easily and requires multiple rest breaks during a 60 minute therapy session. Patient to discharge at overall min-mod assist level.  Patient's care partner not necessary to provide the necessary physical and cognitive assistance at discharge secondary to patient discharging to SNF.    Reasons goals not met: Patient did not meeting bathing and lower body dressing goals secondary to patient requiring mod assist.   Recommendation:  Patient will benefit from ongoing skilled OT services in skilled nursing facility setting to continue to advance functional skills in the area of BADLs, balance, activity tolerance, strength, and minimize fall risk.  Equipment: No equipment providedDischarge to SNF  Reasons for discharge: treatment goals met and discharge from hospital  Patient/family agrees with progress made and goals achieved: Yes  OT Discharge  Pain Pain Assessment Pain Assessment: No/denies pain   Vision/Perception  Vision- History Baseline Vision/History: Wears glasses Wears Glasses: At all times Patient Visual Report: No change from baseline Vision- Assessment Vision Assessment?: No apparent visual deficits  Cognition Overall Cognitive Status: Impaired/Different from baseline Arousal/Alertness: Lethargic Orientation Level:  Oriented X4 Attention: Focused;Sustained Focused Attention: Appears intact Sustained Attention: Impaired Sustained Attention Impairment: Functional basic Memory: Impaired Memory Impairment: Decreased recall of new information Awareness: Impaired Awareness Impairment: Emergent impairment Safety/Judgment: Impaired Sensation Sensation Light Touch: Appears Intact Stereognosis: Appears Intact Hot/Cold: Appears Intact Proprioception: Appears Intact    Trunk/Postural Assessment  Cervical Assessment Cervical Assessment: Exceptions to WFL Cervical Strength Overall Cervical Strength Comments: slightly flexed neck with rounded shoulders Thoracic Assessment Thoracic Assessment: Within Functional Limits Lumbar Assessment Lumbar Assessment: Within Functional Limits Postural Control Postural Control: Deficits on evaluation Postural Limitations: Pt with flexed trunk in sitting and standing  Balance Static Sitting Balance Static Sitting - Level of Assistance: 6: Modified independent (Device/Increase time) Dynamic Sitting Balance Dynamic Sitting - Balance Support: During functional activity;No upper extremity supported Dynamic Sitting - Level of Assistance: 5: Stand by assistance Extremity/Trunk Assessment RUE Assessment RUE Assessment: Within Functional Limits LUE Assessment LUE Assessment: Within Functional Limits  See FIM for current functional status  Lanier, Thomas Chappell 06/11/2014, 1:48 PM  

## 2014-06-11 NOTE — Plan of Care (Signed)
Problem: RH Bathing Goal: LTG Patient will bathe with assist, cues/equipment (OT) LTG: Patient will bathe specified number of body parts with assist with/without cues using equipment (position) (OT)  Outcome: Not Met (add Reason) Not met as patient requires mod assist  Problem: RH Dressing Goal: LTG Patient will perform lower body dressing w/assist (OT) LTG: Patient will perform lower body dressing with assist, with/without cues in positioning using equipment (OT)  Outcome: Not Met (add Reason) Not met as patient requires mod assist

## 2014-06-11 NOTE — Discharge Summary (Signed)
Discharge summary job # [id number]

## 2014-06-11 NOTE — Progress Notes (Signed)
Social Work Patient ID: Zollie Scale, female   DOB: [birth year], 74 y.o.   MRN: [id number]   Lennart Pall, LCSW Social Worker Signed  Patient Care Conference 06/11/2014  6:43 AM    Expand All Collapse All   Inpatient RehabilitationTeam Conference and Plan of Care Update Date: 06/09/2014   Time: 2:45 PM     Patient Name: Morgan [name]       Medical Record Number: [id number]  Date of Birth: 09-21-1940 Sex: Female         Room/Bed: 4W20C/4W20C-01 Payor Info: Payor: MEDICARE / Plan: MEDICARE PART A AND B / Product Type: *No Product type* /    Admitting Diagnosis: Tumor resection   Admit Date/Time:  05/29/2014  4:15 PM Admission Comments: No comment available   Primary Diagnosis:  Meningioma Principal Problem: Meningioma    Patient Active Problem List     Diagnosis  Date Noted   .  DM neuropathy with neurologic complication     .  Acute respiratory failure, unspecified whether with hypoxia or hypercapnia     .  Hyponatremia     .  Peripheral neuropathy     .  Essential hypertension     .  Gastroesophageal reflux disease without esophagitis     .  Meningioma     .  Acute urinary retention     .  HLD (hyperlipidemia)     .  Other emphysema     .  Other specified hypothyroidism     .  Primary gout     .  Brain mass  05/03/2014   .  Acute encephalopathy  05/03/2014   .  Left-sided weakness  05/03/2014   .  Aphasia  05/03/2014   .  Abnormal head CT     .  Left hemiparesis     .  Hypothyroidism, acquired, autoimmune  01/04/2012   .  Peripheral angiopathy due to secondary diabetes  01/04/2012   .  Diabetes type 2, uncontrolled     .  Hypoglycemia associated with diabetes     .  Edema of both legs     .  Combined hyperlipidemia     .  Acquired autoimmune hypothyroidism     .  Thyroiditis, autoimmune     .  Abnormal liver function tests     .  Sleep apnea, obstructive     .  History of gastroesophageal reflux (GERD)     .  Depression     .  Type 2 diabetes mellitus with  polyneuropathy     .  COPD with acute exacerbation     .  Goiter     .  Fatigue     .  Vertigo     .  Type II diabetes mellitus with peripheral angiopathy     .  Gout     .  Pallor     .  Type II or unspecified type diabetes mellitus without mention of complication, uncontrolled  08/01/2010   .  Hypertension  08/01/2010   .  Mixed hyperlipidemia  08/01/2010   .  Obesity  08/01/2010     Expected Discharge Date: Expected Discharge Date:  (SNF)  Team Members Present: Physician leading conference: Dr. Alger Simons Social Worker Present: Lennart Pall, LCSW Nurse Present: Heather Roberts, RN PT Present: Melene Plan, Cecille Rubin, PT OT Present: Roanna Epley, Griffin Basil, OT SLP Present: Weston Anna, SLP PPS Coordinator present : Daiva Nakayama, RN, Hand  Current Status/Progress  Goal  Weekly Team Focus   Medical     working on nausea and po intake. ivf stopped. reglan resumed   po intake, increase activity  po intake, nausea, med changes   Bowel/Bladder     Continent to B&B.  To remain continent to B&B.  Toilitting Q 2 hrs. and PRN.   Swallow/Nutrition/ Hydration     Dys. 3 textures with thin liquids, full supervision and Mod A for use of swallow strategies, encouragement for PO intake   Min A  Tolerance of regular PO trials, utilization of swallowing compensatory strategies   ADL's     decreased participation secondary to continued N/V; bathing-mod A; LB dressing-max A; toileting-max A  supervision overall  activity tolerance, functional transfers, standing balance, BADLs   Mobility     Min-mod A overall; decline from last week secondary to limited ability to participate  Downgraded to supervision bed mobility, min A transfers, gait, stairs  Vestibular compensation and habituation, transfers, activity tolerance, gait, balance   Communication               Safety/Cognition/ Behavioral Observations    Mod-Max A  Min A  Maintaining eyes open during sessions, attention,  functional problem-solving, initiation   Pain     Denies pain.  Maintain pain levels less than 3.On scale 1 to 10  Assess pain levels Q 2 hrs. and PRN    Skin     No skin breakdowns.Bruises on abdomen and arms.   No skin breakdowns when in rehab.  Assess skin PRN and Q. shift.    Rehab Goals Patient on target to meet rehab goals: No *See Care Plan and progress notes for long and short-term goals.    Barriers to Discharge:  po intake, nausea     Possible Resolutions to Barriers:   see prior, med adjustment, family ed      Discharge Planning/Teaching Needs:   Plan changed to SNF per pt and family request        Team Discussion:    Still issues with appetite;  HR issues likely due to meds.  Stopping IVF today to encourage better po.  Therapy goals downgraded overall. SW reports d/c plan changed to SNF.   Revisions to Treatment Plan:    Downgrade of goals and change in d/c plan to SNF    Continued Need for Acute Rehabilitation Level of Care: The patient requires daily medical management by a physician with specialized training in physical medicine and rehabilitation for the following conditions: Daily direction of a multidisciplinary physical rehabilitation program to ensure safe treatment while eliciting the highest outcome that is of practical value to the patient.: Yes Daily medical management of patient stability for increased activity during participation in an intensive rehabilitation regime.: Yes Daily analysis of laboratory values and/or radiology reports with any subsequent need for medication adjustment of medical intervention for : Post surgical problems;Neurological problems;Marda Stalker 06/11/2014, 3:49 PM                 Lennart Pall, LCSW Social Worker Signed  Patient Care Conference 06/04/2014  9:21 AM    Expand All Collapse All   Inpatient RehabilitationTeam Conference and Plan of Care Update Date: 06/02/2014   Time: 2:55 PM     Patient Name: Morgan [name]        Medical Record Number: [id number]  Date of Birth: 1941-03-01 Sex: Female         Room/Bed: 4W20C/4W20C-01  Payor Info: Payor: MEDICARE / Plan: MEDICARE PART A AND B / Product Type: *No Product type* /    Admitting Diagnosis: Tumor resection   Admit Date/Time:  05/29/2014  4:15 PM Admission Comments: No comment available   Primary Diagnosis:  Meningioma Principal Problem: Meningioma    Patient Active Problem List     Diagnosis  Date Noted   .  DM neuropathy with neurologic complication     .  Acute respiratory failure, unspecified whether with hypoxia or hypercapnia     .  Hyponatremia     .  Peripheral neuropathy     .  Essential hypertension     .  Gastroesophageal reflux disease without esophagitis     .  Meningioma     .  Acute urinary retention     .  HLD (hyperlipidemia)     .  Other emphysema     .  Other specified hypothyroidism     .  Primary gout     .  Brain mass  05/03/2014   .  Acute encephalopathy  05/03/2014   .  Left-sided weakness  05/03/2014   .  Aphasia  05/03/2014   .  Abnormal head CT     .  Left hemiparesis     .  Hypothyroidism, acquired, autoimmune  01/04/2012   .  Peripheral angiopathy due to secondary diabetes  01/04/2012   .  Diabetes type 2, uncontrolled     .  Hypoglycemia associated with diabetes     .  Edema of both legs     .  Combined hyperlipidemia     .  Acquired autoimmune hypothyroidism     .  Thyroiditis, autoimmune     .  Abnormal liver function tests     .  Sleep apnea, obstructive     .  History of gastroesophageal reflux (GERD)     .  Depression     .  Type 2 diabetes mellitus with polyneuropathy     .  COPD with acute exacerbation     .  Goiter     .  Fatigue     .  Vertigo     .  Type II diabetes mellitus with peripheral angiopathy     .  Gout     .  Pallor     .  Type II or unspecified type diabetes mellitus without mention of complication, uncontrolled  08/01/2010   .  Hypertension  08/01/2010   .  Mixed  hyperlipidemia  08/01/2010   .  Obesity  08/01/2010     Expected Discharge Date: Expected Discharge Date:  (SNF)  Team Members Present: Physician leading conference: Dr. Alger Simons Social Worker Present: Lennart Pall, LCSW Nurse Present: Elliot Cousin, RN PT Present: Raylene Everts, PT;Bridgett Ripa, Cottie Banda, PT OT Present: Gareth Morgan, OT SLP Present: Weston Anna, SLP PPS Coordinator present : Daiva Nakayama, RN, CRRN        Current Status/Progress  Goal  Weekly Team Focus   Medical     mengioma s/p resection, ileus, prolonged recovery.   improve po intake, increase activity   reduce lethargy. adjust meds, follow up ct   Bowel/Bladder     patient is currrently continent of bowel and bladder with min assist  remain continent of bowela nd bladder   offer toileting q2h and prn   Swallow/Nutrition/ Hydration     Dys. 3 textures with thin liquids, Full supervision   Supervision  Tolerance of diet upgrade, utilization of swallowing compensatory strategies   ADL's     functional transfers-mod A; bathing-mod A; UB dressing-supervision; LB dressing-max A; toileting-max A; decreased activity tolerance   supervision overall  activity tolerance, funcitonal transfers; standing balance, BADLs, safety awareness   Mobility     Pt is currently, supervision with bed mobility and sitting balance, minA for stand pivot transfers and ambulation with RW; limited tolerance due to dizziness  Overall supervision for transfers and mobility, minA for stairs   Functional transfers and ambulation, balance, activity tolerance and endurance, strength, patient and family education, discharge planning    Communication               Safety/Cognition/ Behavioral Observations    Max A  Supervision  maintain her eyes open during session, attention, initiation    Pain     patient currently denies pain  maintain pain at 3 or less on a scale of 0-10   assess for pain q4h and prn   Skin     no current  ling/symptom of skin breakdown, some bruising to bilateral arms and abdomen from injections  no new skin injury/breakdown  assess skin q shift and prn    Rehab Goals Patient on target to meet rehab goals: No Rehab Goals Revised: limited engagement so far *See Care Plan and progress notes for long and short-term goals.    Barriers to Discharge:  magnitude of deficits, ongoing lethargy, poor intake      Possible Resolutions to Barriers:   improve po intake, family ed, NMR, med adjustment      Discharge Planning/Teaching Needs:   Home with family vs change of plan to SNF - still under discussion with family       Team Discussion:    More sedated yesterday;  CT recheck today and await UA.  Ileus resolving.  MBS today and cleared for thin liquids.  Pt c/o dizziness - vestibular eval to be done tomorrow.  May be medically ready to transfer to SNF next week.   Revisions to Treatment Plan:    None    Continued Need for Acute Rehabilitation Level of Care: The patient requires daily medical management by a physician with specialized training in physical medicine and rehabilitation for the following conditions: Daily direction of a multidisciplinary physical rehabilitation program to ensure safe treatment while eliciting the highest outcome that is of practical value to the patient.: Yes Daily medical management of patient stability for increased activity during participation in an intensive rehabilitation regime.: Yes Daily analysis of laboratory values and/or radiology reports with any subsequent need for medication adjustment of medical intervention for : Post surgical problems;Pulmonary problems  Ralonda Tartt 06/04/2014, 9:23 AM

## 2014-06-11 NOTE — Progress Notes (Signed)
Speech Language Pathology Daily Session Note  Patient Details  Name: VERDENE EMANUELSON MRN: [id number] Date of Birth: January 12, 1941  Today's Date: 06/11/2014 SLP Individual Time: 0800-0900 SLP Individual Time Calculation (min): 60 min  Short Term Goals: Week 2: SLP Short Term Goal 1 (Week 2): Patient will consume current diet with minimal overt s/s of aspiration with Supervision A verbal cues for use of swallowing strategies  SLP Short Term Goal 2 (Week 2): Patient will keep her eyes open during functional tasks for 5 minutes with Mod A multimodal cues.  SLP Short Term Goal 3 (Week 2): Patient will recall new, daily information with Min A multimodal cues for use of external memory aids SLP Short Term Goal 4 (Week 2): Patient will demonstrate basic, functional problem solving with Mod A multimodal cues. SLP Short Term Goal 5 (Week 2): Patient will utilize an increased vocal intensity at the phrase level with Min A verbal cues.   Skilled Therapeutic Interventions: Skilled treatment session focused on dysphagia and cogntive goals. Upon arrival, family member was present and patient was alert and sitting upright in bed, consuming breakfast. RN then arrived to change dressing on patient's LUE. Student facilitated session by providing skilled observation of meal and Min A multimodal cues for patient to keep eyes open for ~30 minutes during functional task of eating and for utilization of safe swallow strategies. Patient demonstrated intermittent overt s/s of aspiration, characterized by immediate throat clear x3 and delayed cough x1, difficult to differentiate if related to swallowing functioning or consistent dry cough occurring regardless of consumption status. Student further facilitated session by providing Mod A multimodal cues for patient to maintain adequate vocal intensity 50% of the time in a quiet environment. Patient required Min A multimodal cues to attend to left field of environment in 75% of  opportunities and Supervision A multimodal cues for selective attention to functional conversation and breakfast meal for ~15 minutes. Patient left upright in bed with 3 siderails raised, bed alarm on, all needs within reach, and family member present. Continue with current plan of care.   FIM:  Comprehension Comprehension Mode: Auditory Comprehension: 4-Understands basic 75 - 89% of the time/requires cueing 10 - 24% of the time Expression Expression Mode: Verbal Expression: 3-Expresses basic 50 - 74% of the time/requires cueing 25 - 50% of the time. Needs to repeat parts of sentences. Social Interaction Social Interaction: 4-Interacts appropriately 75 - 89% of the time - Needs redirection for appropriate language or to initiate interaction. Problem Solving Problem Solving: 3-Solves basic 50 - 74% of the time/requires cueing 25 - 49% of the time Memory Memory: 3-Recognizes or recalls 50 - 74% of the time/requires cueing 25 - 49% of the time FIM - Eating Eating Activity: 5: Supervision/cues;6: More than reasonable amount of time;5: Needs verbal cues/supervision  Pain Pain Assessment Pain Assessment: No/denies pain  Therapy/Group: Individual Therapy  Servando Snare 06/11/2014, 9:50 AM

## 2014-06-11 NOTE — Progress Notes (Signed)
Report was call to Hiko at [facility].Pt. Is ready to be transfer.

## 2014-06-11 NOTE — Progress Notes (Signed)
PHYSICAL MEDICINE & REHABILITATION     PROGRESS NOTE    Subjective/Complaints: Eating a little better. Some confusion at times. No diarrhea, n/v Objective: Vital Signs: Blood pressure 159/85, pulse 101, temperature 98.6 F (37 C), temperature source Oral, resp. rate 17, SpO2 100 %. No results found.  Recent Labs  06/10/14 1108  WBC 7.1  HGB 12.2  HCT 35.0*  PLT 336    Recent Labs  06/08/14 1550 06/10/14 1108  NA 128* 127*  K 4.0 4.1  CL 98 94*  GLUCOSE 139* 276*  BUN 13 17  CREATININE 1.30* 1.54*  CALCIUM 8.7 9.0   CBG (last 3)   Recent Labs  06/10/14 1630 06/10/14 2041 06/11/14 0628  GLUCAP 78 199* 144*    Wt Readings from Last 3 Encounters:  05/19/14 100.3 kg (221 lb 1.9 oz)  01/04/12 109.09 kg (240 lb 8 oz)  09/11/11 110.496 kg (243 lb 9.6 oz)    Physical Exam:  No distressed. More alert  HENT: mild thrush on sides of tongue Craniotomy site well-healed. No edema. ?nystagmus laterally---keeps eyes closed  Eyes: Conjunctivae and EOM are normal. Pupils are equal, round, and reactive to light.  Pupils round and reactive to light without nystagmus  Neck: Normal range of motion. Neck supple. No JVD present. No tracheal deviation present. No thyromegaly present.  Cardiovascular: Normal rate and regular rhythm.  Respiratory: Effort normal and breath sounds normal. No respiratory distress. She has no wheezes. She has no rales.  GI: Soft. Bowel sounds are normal. She exhibits no distension. There is no tenderness. There is no rebound.  Musculoskeletal:  She moves all extremities  Lymphadenopathy:   She has no cervical adenopathy.  Neurological:  Mood is appropriate.  Oriented to place, reason she's at hospital. She was able to provide her name, age. UES: grossly 3-4/5 proximal to distal UE's. HF 3/5, KE 4-, ADF/APF 4+. Senses pain in all 4's. Decreased [place] on left.  Psychiatric: She has a normal mood and affect--more dynamic. Her behavior is  normal.   Assessment/Plan: 1. Functional deficits secondary to right meningioma s/p resection which require 3+ hours per day of interdisciplinary therapy in a comprehensive inpatient rehab setting. Physiatrist is providing close team supervision and 24 hour management of active medical problems listed below. Physiatrist and rehab team continue to assess barriers to discharge/monitor patient progress toward functional and medical goals.  Stable for SNF transfer at this time.  FIM: FIM - Bathing Bathing Steps Patient Completed: Chest, Right Arm, Left Arm, Abdomen, Front perineal area, Right upper leg, Left upper leg Bathing: 3: Mod-Patient completes 5-7 26f10 parts or 50-74%  FIM - Upper Body Dressing/Undressing Upper body dressing/undressing steps patient completed: Thread/unthread right sleeve of pullover shirt/dresss, Thread/unthread left sleeve of pullover shirt/dress, Put head through opening of pull over shirt/dress Upper body dressing/undressing: 4: Min-Patient completed 75 plus % of tasks FIM - Lower Body Dressing/Undressing Lower body dressing/undressing steps patient completed: Thread/unthread right underwear leg, Thread/unthread left underwear leg, Thread/unthread right pants leg, Pull pants up/down, Thread/unthread left pants leg, Pull underwear up/down Lower body dressing/undressing: 3: Mod-Patient completed 50-74% of tasks  FIM - Toileting Toileting steps completed by patient: Performs perineal hygiene Toileting: 2: Max-Patient completed 1 of 3 steps  FIM - TRadio producerDevices: WEnvironmental consultant BRecruitment consultantTransfers: 3-To toilet/BSC: Mod A (lift or lower assist), 3-From toilet/BSC: Mod A (lift or lower assist)  FIM - BControl and instrumentation engineerDevices: WCopy  4: Supine > Sit: Min A (steadying Pt. > 75%/lift 1 leg), 4: Bed > Chair or W/C: Min A (steadying Pt. > 75%)  FIM - Locomotion:  Wheelchair Distance: 50 Locomotion: Wheelchair: 1: Travels less than 50 ft with minimal assistance (Pt.>75%) FIM - Locomotion: Ambulation Locomotion: Ambulation Assistive Devices: Administrator Ambulation/Gait Assistance: 4: Min assist, 4: Min guard Locomotion: Ambulation: 1: Travels less than 50 ft with minimal assistance (Pt.>75%)  Comprehension Comprehension Mode: Auditory Comprehension: 4-Understands basic 75 - 89% of the time/requires cueing 10 - 24% of the time  Expression Expression Mode: Verbal Expression: 3-Expresses basic 50 - 74% of the time/requires cueing 25 - 50% of the time. Needs to repeat parts of sentences.  Social Interaction Social Interaction: 4-Interacts appropriately 75 - 89% of the time - Needs redirection for appropriate language or to initiate interaction.  Problem Solving Problem Solving: 3-Solves basic 50 - 74% of the time/requires cueing 25 - 49% of the time  Memory Memory: 3-Recognizes or recalls 50 - 74% of the time/requires cueing 25 - 49% of the time Medical Problem List and Plan: 1. Functional deficits secondary to right frontal meningioma status post resection 05/08/2014  -watch mental status today. Has had some fluctuation  2. DVT Prophylaxis/Anticoagulation: Subcutaneous heparin initiated 05/22/2014.   3. Pain Management: Hydrocodone as needed---limit due to mentation 4. Mood/depression: Cymbalta 60 mg twice a day. Provide emotional support 5. Neuropsych: This patient is capable of making decisions on her own behalf. 6. Skin/Wound Care: Routine skin checks 7. Fluids/Electrolytes/Nutrition/ ongoing nausea: encourage po---needs to be doing better with this prior to dc  -RD following  -poor po intake likely multifactorial--likely vestibular component also---vestibular rx ongoing  -reglan low dose  -IVF hs stopped 8. Seizure disorder. Keppra 500 mg 3 times a day, Vimpat 200 mg twice a day.    -consider neuro follow up if there is ongoing  suspicion of absence type sz activity 9. Dysphagia. Dysphagia 3 nectar liquids. Follow speech therapy. Monitor hydration 10. Constipation with improvement, still with poor appetite/nausea-. Continue Mira lax twice a day.   - reglan 42m qid 11. Hypertension. Clonidine patch 0.3 mg weekly, Lasix 40 mg twice a day. Markedly increased mobility 12 hypothyroidism. Synthroid 13. History of left breast cancer. Continue arimidex 14. Diabetes mellitus and peripheral neuropathy. Hemoglobin A1c 9.4. Sliding scale insulin. Patient on Liraglutide 18 mg subcutaneously daily prior to admission.  -began nph insulin to better control sugars for now---titrated to 12u bid with improvement in PO intake. 15. History of gout. Colchicine 0.6 mg twice a day resumed 16. COPD. Continue Dulera 2 puffs twice daily. No shortness of breath 17. Tachycardia--    -bmet noted  -on lopressor 1066mbid at home---have increased to 5058mid currently---observe today  LOS (Days) 13 A FACE TO FACE EVALUATION WAS PERFORMED  SWARTZ,ZACHARY T 06/11/2014 9:58 AM

## 2014-06-11 NOTE — Progress Notes (Signed)
Occupational Therapy Session Note  Patient Details  Name: Morgan [name] MRN: [id number] Date of Birth: [birth year]  Today's Date: 06/11/2014 OT Individual Time: 0900-1000 OT Individual Time Calculation (min): 60 min    Short Term Goals: Week 2:  OT Short Term Goal 1 (Week 2): Pt will keep her eyes open 75% of ADL session demonstrating sustained attention OT Short Term Goal 2 (Week 2): Pt will perform LB bathing with min assist sit<>stand OT Short Term Goal 3 (Week 2): Pt will perform LB dressing with min assist sit<>stand OT Short Term Goal 4 (Week 2): Pt will perform toileting tasks with min A  Skilled Therapeutic Interventions/Progress Updates:    Pt resting in bed with sister-in-law present upon arrival.  Pt's sister-in-law present throughout session.  Pt engaged in BADL retraining including bathing and dressing with sit<>stand from w/c at sink.  Pt declined shower this morning but agreed to shower tomorrow morning.  Pt was able to sit EOB with supervision and extra time before standing and amb with RW to w/c at sink.  Pt required min A for sit<>stand throughout session.  Pt fatigued quickly and required multiple rest breaks throughout session.  Pt required more than usual amount of time to complete all bathing and dressing tasks and required rest breaks between segments of tasks.  Pt opened eyes occasionally throughout session but preferred to keep eyes closed for approx 75% of session.  Pt requested use of toilet and transferred to toilet with min A for SPT.  Pt remained on toilet with NT present.  Focus on activity tolerance, sit<>stand, standing balance, task initiation, functional transfers, and safety awareness.  Therapy Documentation Precautions:  Precautions Precautions: Fall Precaution Comments: decreased sustained attention Restrictions Weight Bearing Restrictions: Yes  Pain: Pain Assessment Pain Assessment: No/denies pain  See FIM for current functional  status  Therapy/Group: Individual Therapy  Leroy Libman 06/11/2014, 12:10 PM

## 2014-06-11 NOTE — Patient Care Conference (Signed)
Inpatient RehabilitationTeam Conference and Plan of Care Update Date: 06/09/2014   Time: 2:45 PM    Patient Name: Morgan [name]      Medical Record Number: [id number]  Date of Birth: 1940/06/10 Sex: Female         Room/Bed: 4W20C/4W20C-01 Payor Info: Payor: MEDICARE / Plan: MEDICARE PART A AND B / Product Type: *No Product type* /    Admitting Diagnosis: Tumor resection  Admit Date/Time:  05/29/2014  4:15 PM Admission Comments: No comment available   Primary Diagnosis:  Meningioma Principal Problem: Meningioma  Patient Active Problem List   Diagnosis Date Noted  . DM neuropathy with neurologic complication   . Acute respiratory failure, unspecified whether with hypoxia or hypercapnia   . Hyponatremia   . Peripheral neuropathy   . Essential hypertension   . Gastroesophageal reflux disease without esophagitis   . Meningioma   . Acute urinary retention   . HLD (hyperlipidemia)   . Other emphysema   . Other specified hypothyroidism   . Primary gout   . Brain mass 05/03/2014  . Acute encephalopathy 05/03/2014  . Left-sided weakness 05/03/2014  . Aphasia 05/03/2014  . Abnormal head CT   . Left hemiparesis   . 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 Peripheral angiopathy due to secondary diabetes 01/04/2012  . Diabetes type 2, uncontrolled   . Hypoglycemia associated with diabetes   . Edema of both legs   . Combined hyperlipidemia   . Acquired autoimmune hypothyroidism   . Thyroiditis, autoimmune   . Abnormal liver function tests   . Sleep apnea, obstructive   . History of gastroesophageal reflux (GERD)   . Depression   . Type 2 diabetes mellitus with polyneuropathy   . COPD with acute exacerbation   . Goiter   . Fatigue   . Vertigo   . Type II diabetes mellitus with peripheral angiopathy   . Gout   . Pallor   . Type II or unspecified type diabetes mellitus without mention of complication, uncontrolled 08/01/2010  . Hypertension 08/01/2010  . Mixed  hyperlipidemia 08/01/2010  . Obesity 08/01/2010    Expected Discharge Date: Expected Discharge Date:  (SNF)  Team Members Present: Physician leading conference: Dr. Alger Simons Social Worker Present: Lennart Pall, LCSW Nurse Present: Heather Roberts, RN PT Present: Melene Plan, Cecille Rubin, PT OT Present: Roanna Epley, Griffin Basil, OT SLP Present: Weston Anna, SLP PPS Coordinator present : Daiva Nakayama, RN, CRRN     Current Status/Progress Goal Weekly Team Focus  Medical   working on nausea and po intake. ivf stopped. reglan resumed  po intake, increase activity  po intake, nausea, med changes   Bowel/Bladder   Continent to B&B.  To remain continent to B&B.  Toilitting Q 2 hrs. and PRN.   Swallow/Nutrition/ Hydration   Dys. 3 textures with thin liquids, full supervision and Mod A for use of swallow strategies, encouragement for PO intake  Min A  Tolerance of regular PO trials, utilization of swallowing compensatory strategies   ADL's   decreased participation secondary to continued N/V; bathing-mod A; LB dressing-max A; toileting-max A  supervision overall  activity tolerance, functional transfers, standing balance, BADLs   Mobility   Min-mod A overall; decline from last week secondary to limited ability to participate  Downgraded to supervision bed mobility, min A transfers, gait, stairs  Vestibular compensation and habituation, transfers, activity tolerance, gait, balance   Communication             Safety/Cognition/  Behavioral Observations  Mod-Max A  Min A  Maintaining eyes open during sessions, attention, functional problem-solving, initiation   Pain   Denies pain.  Maintain pain levels less than 3.On scale 1 to 10  Assess pain levels Q 2 hrs. and PRN   Skin   No skin breakdowns.Bruises on abdomen and arms.  No skin breakdowns when in rehab.  Assess skin PRN and Q. shift.    Rehab Goals Patient on target to meet rehab goals: No *See Care Plan and progress  notes for long and short-term goals.  Barriers to Discharge: po intake, nausea    Possible Resolutions to Barriers:  see prior, med adjustment, family ed    Discharge Planning/Teaching Needs:  Plan changed to SNF per pt and family request      Team Discussion:  Still issues with appetite;  HR issues likely due to meds.  Stopping IVF today to encourage better po.  Therapy goals downgraded overall. SW reports d/c plan changed to SNF.  Revisions to Treatment Plan:  Downgrade of goals and change in d/c plan to SNF   Continued Need for Acute Rehabilitation Level of Care: The patient requires daily medical management by a physician with specialized training in physical medicine and rehabilitation for the following conditions: Daily direction of a multidisciplinary physical rehabilitation program to ensure safe treatment while eliciting the highest outcome that is of practical value to the patient.: Yes Daily medical management of patient stability for increased activity during participation in an intensive rehabilitation regime.: Yes Daily analysis of laboratory values and/or radiology reports with any subsequent need for medication adjustment of medical intervention for : Post surgical problems;Neurological problems;Other  [name] 06/11/2014, 3:49 PM

## 2014-06-11 NOTE — Progress Notes (Signed)
Social Work Patient ID: Morgan [name], female   DOB: 01-31-1941, 74 y.o.   MRN: [id number]  Have received SNF bed offer today from [facility].  Pt and family accepting bed with transfer planned for this afternoon.  MD and tx team aware.  Kaylynn Chamblin, LCSW

## 2014-06-11 NOTE — Progress Notes (Signed)
Speech Language Pathology Discharge Summary  Patient Details  Name: Morgan [name] MRN: [id number] Date of Birth: 12-02-1940   Patient has met 4 of 6 long term goals.  Patient to discharge at overall Mod level.   Reasons goals not met: Patient continues to require Mod A for functional problem solving and recall of new information    Clinical Impression/Discharge Summary: Patient has made functional gains and has met 4 of 6 LTG's this admission due to increased swallowing function, attention and awareness. Currently, patient is consuming Dys. 3 textures with thin liquids via cup with intermittent overt s/s of aspiration and requires Min A multimodal cues for utilization of swallowing compensatory strategies. Patient also continues to Mod A for working memory and functional problem solving and Min A for selective attention in a mildly distracting environment and emergent awareness of deficits.  Patient's family is unable to provide the necessary assistance needed at this time, therefore, patient will discharge to a SNF. Patient would benefit from f/u SLP services to maximize cognitive and swallowing function in order to maximize her overall functional independence and reduce caregiver burden.   Care Partner:  Caregiver Able to Provide Assistance: No  Type of Caregiver Assistance: Physical;Cognitive  Recommendation:  24 hour supervision/assistance;Skilled Nursing facility  Rationale for SLP Follow Up: Maximize cognitive function and independence;Maximize swallowing safety;Reduce caregiver burden   Equipment: N/A   Reasons for discharge: Discharged from hospital   Patient/Family Agrees with Progress Made and Goals Achieved: Yes   See FIM for current functional status  Kona Yusuf 06/11/2014, 3:58 PM

## 2014-06-11 NOTE — Plan of Care (Signed)
Problem: RH Bathing Goal: LTG Patient will bathe with assist, cues/equipment (OT) LTG: Patient will bathe specified number of body parts with assist with/without cues using equipment (position) (OT)  Outcome: Not Met (add Reason) Pt requires mod A  Problem: RH Dressing Goal: LTG Patient will perform lower body dressing w/assist (OT) LTG: Patient will perform lower body dressing with assist, with/without cues in positioning using equipment (OT)  Outcome: Not Met (add Reason) Mod A without AE

## 2014-06-11 NOTE — Progress Notes (Signed)
Physical Therapy Session Note  Patient Details  Name: Morgan [name] MRN: [id number] Date of Birth: 1940-05-17  Today's Date: 06/11/2014 PT Individual Time: 1305-1405 PT Individual Time Calculation (min): 60 min   Short Term Goals: Week 2:  PT Short Term Goal 1 (Week 2): = LTG which have been downgraded due to slow progress  Skilled Therapeutic Interventions/Progress Updates:    Pt received supine in bed, HOB elevated with lunch tray, family present. Treatment focused on automatic tasks for increased attention and functional mobility.  Therapeutic activity via transfers supine > sit with Min A, sitting EOB to don shoes with S, bed>chair via stand-step with Min A, sit><stand from w/c with Min A. At beginning of session, pt was finishing lunch and sat EOB with PT to take a few more bites and was able to hold her cup and several sips of tea without evidence of oral holding; multiple reminders and visual checks to swallow all food, pt with perseveration with chewing.  Neuromuscular reeducation via forced use and multimodal cues for attention, dynamic standing balance, functional mobility: -Pt propelled w/c x 50' with Min A, max cues for effective technique with BUEs; pt with perseveration at wheelchair breaks intermittently during session. - laundry room activity: standing at washer/dryer, identifying common items, reaching down to open dryer door - ADL apartment: standing at sink to wash hands, navigating RW in kitchen, identifying common items, functional reaching to open refrigerator and freezor  - while standing at refrigerator, pt became DOE and required seated rest break.  Gait training for activity tolerance and functional skills: - backwards walking with RW x 4' with Min A - pt ambulated x 63' with RW and Min A: VCs for upright gaze and safety - stairs x 2 with 2 rails and 2 helpers: pt was attempting 4 steps, but after 2 steps, attempted to back down stairs due to fatigue and required  manual facilitation for safe foot placement due to inattention  Pt c/o pain in throat when asked by PT to speak more loudly, RN informed. Pt also had episode of coughing while seated in ADL kitchen; PT offered small sip of water, which pt was unable to coordinate to swallow and when asked to spit, was unable to complete and could only open mouth. Pt noted with facial grimace and was notably fatigued for remainder of session.   PT propelled pt to room due to fatigue at end of session, family present, all needs within reach.  Therapy Documentation Precautions:  Precautions Precautions: Fall Precaution Comments: decreased sustained attention Restrictions Weight Bearing Restrictions: Yes   Vital Signs: Pulse Rate: 102 Patient Position (if appropriate): Sitting, after exertion SpO2: 100 % Pain: Pain Assessment Pain Assessment: Faces Pain Score: 5  Faces Pain Scale: Hurts little more Pain Type: Acute pain Pain Location: Throat Pain Orientation: Anterior Pain Descriptors / Indicators: Burning Pain Onset: Unable to tell Patients Stated Pain Goal: 2 Pain Intervention(s): Ambulation/increased activity;Distraction;Environmental changes;Repositioned   Locomotion : Ambulation Ambulation/Gait Assistance: 4: Min Financial controller Distance: 55    See FIM for current functional status  Therapy/Group: Individual Therapy  Carey Bullocks, SPT  06/11/2014, 4:04 PM

## 2014-06-11 NOTE — Discharge Summary (Signed)
[name], [name]               ACCOUNT NO.:  [id number]  MEDICAL RECORD NO.:  [id number]  LOCATION:  4W20C                        FACILITY:  [facility]  PHYSICIAN:  Meredith Staggers, M.D.DATE OF BIRTH:  [birth year]  DATE OF ADMISSION:  05/29/2014 DATE OF DISCHARGE:  06/11/2014                              DISCHARGE SUMMARY   DISCHARGE DIAGNOSES: 1. Functional deficits secondary to right frontal meningioma with     resection on May 08, 2014. 2. Subcutaneous heparin initiated on May 22, 2014 for DVT     prophylaxis. 3. Pain management. 4. Depression. 5. Seizure disorder. 6. Dysphagia. 7. Hypertension. 8. Hypothyroidism. 9. History of left breast cancer. 10.Diabetes mellitus, peripheral neuropathy. 11.History of gout. 12.History of chronic obstructive pulmonary disease.  HISTORY OF PRESENT ILLNESS:  This is a 75 year old right-handed female, multi medical with history of left breast cancer 5 years in remission, renal cancer with nephrectomy, diabetes mellitus.  She lives with her husband, uses a walker, independent prior to admission.  Admitted on May 03, 2014 with altered mental status, seizure and left-sided weakness, difficulty in her speech and a gaze to the left.  CT MRI imaging showed evidence of enlarging poorly characterized mass at the high frontal parietal region measuring 5.4 x 3.3 cm with underlying vasogenic edema, suspect meningioma.  EEG shows slowing of cerebral activity, diffusely more pronounced involving the right hemisphere, in addition to, epileptiform disturbance involving the right hemisphere. She was loaded with Keppra as well as Vimpat.  Neurosurgery consulted. Underwent right frontal stereotactic craniotomy for tumor resection on May 08, 2014 per Dr. Christella Noa.  The patient did require intubation for a short time, followed by Critical Care Medicine, Decadron protocol as directed.  Developed bouts of nausea, vomiting, extreme  constipation. Gastroenterology Service was consulted on May 18, 2014.  Abdominal film showed no evidence of obstruction or perforation.  EGD was unremarkable with no evidence of esophagitis, ulcers or strictures.  She did require a nasogastric tube for a short time for nutritional support and bowel program was regulated.  She also had received TNA for a short time.  Her diet was advanced to a mechanical soft with nectar thick liquids.  Subcutaneous heparin for DVT prophylaxis initiated May 22, 2014.  The patient was admitted for comprehensive rehab program.  PAST MEDICAL HISTORY:  See discharge diagnoses.  SOCIAL HISTORY:  Lives with family.  Independent prior to admission with assistive device.  Functional status upon admission to rehab services was min mod assist ambulate 12 feet with the rolling walker, +2 physical assist supine to sit, min to mod assist activities of daily living.  PHYSICAL EXAMINATION:  VITAL SIGNS:  Blood pressure 141/77, pulse 112, temperature 97.5, respirations 20. GENERAL:  This was an alert female sitting up in the chair.  She mostly kept her eyes closed during exam.  Flat affect.  Oriented to person, place, but needing cues for situation in the hospital. LUNGS:  Clear to auscultation. CARDIAC:  Regular rate and rhythm. ABDOMEN:  Soft, nontender.  Good bowel sounds. NEUROLOGIC:  Craniotomy site well healed.  REHABILITATION HOSPITAL COURSE:  Patient was admitted to inpatient rehab services with therapies initiated on a 3-hour daily basis consisting  of physical therapy, occupational therapy, speech therapy, and rehabilitation nursing.  The following issues were addressed during the patient's rehabilitation stay.  Pertaining to Ms. Lindy's right frontal meningioma, she had undergone resection on May 08, 2014 with followup neurosurgery, Dr. Ashok Pall.  Latest cranial CT scan on February 23, shows no significant hemorrhage or fluid  collections.  She had been on subcutaneous heparin for DVT prophylaxis.  No bleeding episodes.  Pain management with the use of hydrocodone and good results. She did have a history of depression.  She continued on Cymbalta. Emotional support provided.  Her diet was steadily advanced to a mechanical soft thin liquid diet.  She exhibited no signs of aspiration and tolerated this quite well.  She continued on Keppra as well as Vimpat for seizure disorder.  No further seizure activity noted.  Bouts of constipation had been resolved with laxative assistance.  Blood pressures monitored, no orthostatic changes.  She continued on Arimidex for history of left breast cancer.  Diabetes mellitus, peripheral neuropathy.  Hemoglobin A1c 9.4.  Blood sugars remained well controlled. She did have a history of gout.  Her colchicine had since been resumed. No flare-ups of gout noted.  Intermittent bouts of tachycardia.  No chest pain or shortness of breath.  Her Lopressor had been titrated to 50 mg twice daily and monitored.  She did have some dizziness vestibular components, placed on a scopolamine patch with good results.  The patient received weekly collaborative interdisciplinary team conferences to discuss estimated length of stay, family teaching, and any barriers to discharge.  She can ambulate 10 feet minutes assist rolling walker, propel her wheelchair, bilateral upper extremity supervision minimal assistance.  Activities of daily living, focussed on self-care, energy conservation, functional transfers.  She did require increased time and verbal cues for initiation of task.  With limited assistance at home, I felt skilled nursing facility was needed, bed becoming available June 11, 2014.  DISCHARGE MEDICATIONS: 1. Arimidex 1 mg p.o. daily. 2. Artificial Tears both eyes 4 times every 4 hours as needed. 3. Clonidine patch 0.3 mg changed weekly. 4. Colchicine 0.6 mg p.o. b.i.d. 5. Cymbalta 60 mg  p.o. b.i.d. 6. Lasix 40 mg p.o. b.i.d. 7. Mucinex 600 mg p.o. b.i.d. 8. Hydrocodone 5-325 mg 1 tablet every 6 hours as needed moderate     pain. 9. Insulin NPH 12 units subcutaneously before breakfast and bedtime. 10.Vimpat 200 mg p.o. b.i.d. 11.Keppra 500 mg p.o. t.i.d. 12.Synthroid 200 mcg once per day on Monday, Tuesday, Wednesday,     Thursday, and Friday and 300 mg p.o. every Sunday. 13.Liraglutide 1.8 mg subcutaneously daily. 14.Reglan 5 mg p.o. t.i.d. before meals and bedtime. 15.Lopressor 50 mg p.o. b.i.d. 16.Dulera 100-5 mcg inhaler two puffs twice daily. 17.Protonix 40 mg p.o. b.i.d. 18.MiraLAX 17 g p.o. b.i.d. hold for loose stools. 19.Scopolamine patch 1.5 mg change every 72 hours.  DIET:  Mechanical soft thin liquids.  FOLLOWUP:  The patient should follow up Dr. Ashok Pall, Neurosurgery and call for appointment Dr. Alger Simons at the outpatient rehab service office as directed.     Lauraine Rinne, P.A.   ______________________________ Meredith Staggers, M.D.    DA/MEDQ  D:  06/11/2014  T:  06/11/2014  Job:  [id number]  cc:   Wonda Horner, M.D. Ashok Pall, M.D.

## 2014-06-12 NOTE — Progress Notes (Signed)
Physical Therapy Discharge Summary  Patient Details  Name: Morgan [name] MRN: [id number] Date of Birth: Jun 25, 1940   Patient has met 2 of 6 long term goals due to improved activity tolerance, improved balance, ability to compensate for deficits and improved attention.  Patient to discharge at an ambulatory level [place] for very short distances and w/c level of min A for short distances due to impaired activity tolerance and strength.  Pt's ability to fully participate in therapy was often limited by dizziness, nausea, and cardiac complications. Patient's care partner unable to provide the necessary physical and cognitive assistance at discharge, therefore pt to D/C to SNF for more long term rehabilitation to reach higher level of functional mobility independence prior to D/C home.  Reasons goals not met: Pt did not meet supervision bed mobility, gait or w/c mobility distance or stair negotiation goals secondary to fatigue and impaired activity tolerance.    Recommendation:  Patient will benefit from ongoing skilled PT services in skilled nursing facility setting to continue to advance safe functional mobility, address ongoing impairments in activity tolerance/endurance, impaired cognition, impaired strength, balance and postural control, gait and minimize fall risk.  Equipment: No equipment provided; defer to next venue of care  Reasons for discharge: discharge from hospital  Patient/family agrees with progress made and goals achieved: Yes  PT Discharge Please see FIM and final PT treatment notes for current functional status  Raylene Everts [facility] 06/12/2014, 11:09 AM

## 2014-06-12 NOTE — Progress Notes (Signed)
Social Work  Discharge Note  The overall goal for the admission was met for:   Discharge location: NO - pt and family changed plan to SNF  Length of Stay: Yes - 13 days  Discharge activity level: No - slow progress and transitioned to SNF sooner than originally targeted d/c date  Home/community participation: No  Services provided included: MD, RD, PT, OT, SLP, RN, TR, Pharmacy and SW  Financial Services: Medicare and Private Insurance: [place]  Follow-up services arranged: Other: SNF @ [facility];  transport via [place] (or additional information):  Patient/Family verbalized understanding of follow-up arrangements: Yes  Individual responsible for coordination of the follow-up plan: pt  Confirmed correct DME delivered: NA    Tommey Barret

## 2014-06-12 NOTE — Plan of Care (Signed)
Problem: RH Bed Mobility Goal: LTG Patient will perform bed mobility with assist (PT) LTG: Patient will perform bed mobility with assistance, with/without cues (PT).  Outcome: Not Met (add Reason) Pt required min A for bed mobility  Problem: RH Ambulation Goal: LTG Patient will ambulate in controlled environment (PT) LTG: Patient will ambulate in a controlled environment, # of feet with assistance (PT).  Outcome: Not Met (add Reason) Pt able to ambulate with min A but did not meet distance due to fatigue     Problem: RH Wheelchair Mobility Goal: LTG Patient will propel w/c in controlled environment (PT) LTG: Patient will propel wheelchair in controlled environment, # of feet with assist (PT)  Outcome: Not Met (add Reason) Pt able to propel w/c with min A but unable to propel goal distance due to fatigue  Problem: RH Stairs Goal: LTG Patient will ambulate up and down stairs w/assist (PT) LTG: Patient will ambulate up and down # of stairs with assistance (PT)  Outcome: Not Met (add Reason) Unable to complete all 4 stairs or perform with min A due to fatigue

## 2014-07-23 ENCOUNTER — Ambulatory Visit (INDEPENDENT_AMBULATORY_CARE_PROVIDER_SITE_OTHER): Payer: Medicare Other | Admitting: Neurology

## 2014-07-23 ENCOUNTER — Telehealth: Payer: Self-pay | Admitting: Neurology

## 2014-07-23 ENCOUNTER — Encounter: Payer: Self-pay | Admitting: Neurology

## 2014-07-23 VITALS — BP 156/78 | HR 61

## 2014-07-23 DIAGNOSIS — R4189 Other symptoms and signs involving cognitive functions and awareness: Secondary | ICD-10-CM | POA: Diagnosis not present

## 2014-07-23 DIAGNOSIS — R569 Unspecified convulsions: Secondary | ICD-10-CM

## 2014-07-23 DIAGNOSIS — R269 Unspecified abnormalities of gait and mobility: Secondary | ICD-10-CM | POA: Diagnosis not present

## 2014-07-23 DIAGNOSIS — D329 Benign neoplasm of meninges, unspecified: Secondary | ICD-10-CM | POA: Diagnosis not present

## 2014-07-23 MED ORDER — LACOSAMIDE 200 MG PO TABS: 200.0000 mg | ORAL_TABLET | Freq: Two times a day (BID) | ORAL | Status: DC

## 2014-07-23 MED ORDER — LEVETIRACETAM 500 MG PO TABS: ORAL_TABLET | ORAL | Status: DC

## 2014-07-23 NOTE — Telephone Encounter (Signed)
Morgan [name] with [facility] in [place] is calling to verify medications for the patient. Patient was seen in our office today. Thank you.

## 2014-07-23 NOTE — Progress Notes (Signed)
PATIENT: Morgan [name] DOB: [birth year]  HISTORICAL  Morgan [name] 74 yo female, is accompanied by her husband, son, daughter, referred by her neurosurgeon Dr. Cyndy Freeze. And primary care Dr. Bea Graff, for evaluation of seizure, confusion, memory trouble  In May 03 2014, she had complex partial seizure, imaging study findings large size right frontal meningioma, had right frontal craniotomy at Dr. Cyndy Freeze May 08 2014, hospital course is complicated by slow recovery, bowel obstruction, she was eventually discharged to rehabilitation early March, planning on to be discharged home soon.  However since her hospital admission, and surgery, she remained confused, over the past few weeks, she has fell few times, with self injury, significant memory trouble, woke up from dreams, acting out of dreams, her family is very worried about taking her back home, because of her confusion, frequent falling episode,  Prior to her hospital admission, she was highly functional, driving, taking care of her husband, who just suffered a severe fall injury at the end of 2015, require prolonged hospital admission, still ambulate with a walker, with limited weightbearing, she has history of labile diabetes, variable glucose level up to 600 hypertension, diabetes, obesity, mild short-term memory trouble over the past few years   She is now taking Vimpat 200 mg twice a day, Keppra 500 mg 3 times a day, there was no recurrent seizure, she ambulate with a walker  We have reviewed her most recent CAT scan February 2016 Interval right frontal craniotomy for tumor resection. Low-density edema and encephalomalacia in the right frontal lobe from recent surgery. Small area of hyperdensity in the right frontal cortex may be a small amount of blood   MRI of brain January 2016: 5.4 x 2.2 x 2.9 cm right frontal meningioma with associated vasogenic edema and 4 mm of right-to-left midline shift. Mild age-related atrophy with  chronic microvascular ischemic Disease.  REVIEW OF SYSTEMS: Full 14 system review of systems performed and notable only for as above  ALLERGIES: Allergies  Allergen Reactions  . Latex Rash  . Sulfa Antibiotics Other (See Comments)    Unknown allergic reaction  . Penicillins Rash    HOME MEDICATIONS: Current Outpatient Prescriptions  Medication Sig Dispense Refill  . ACCU-CHEK SMARTVIEW test strip USE TO TEST BLOOD SUGAR 6 TO 8 TIMES A DAY 200 each 3  . albuterol (PROVENTIL,VENTOLIN) 90 MCG/ACT inhaler Inhale 2 puffs into the lungs 4 (four) times daily as needed for wheezing or shortness of breath.     . anastrozole (ARIMIDEX) 1 MG tablet Take 1 mg by mouth daily.      Marland Kitchen aspirin 81 MG chewable tablet Chew 81 mg by mouth daily.      . Calcium Carbonate-Vitamin D (CALTRATE 600+D PO) Take 1 tablet by mouth 2 (two) times daily.     . cloNIDine (CATAPRES) 0.3 MG tablet Take 0.3 mg by mouth 2 (two) times daily.      . colchicine (COLCRYS) 0.6 MG tablet Take 0.6 mg by mouth 2 (two) times daily as needed (gout).     . DULoxetine (CYMBALTA) 60 MG capsule Take 60 mg by mouth 2 (two) times daily.     . febuxostat (ULORIC) 40 MG tablet Take 40 mg by mouth daily.     . felodipine (PLENDIL) 2.5 MG 24 hr tablet Take 2.5 mg by mouth daily.      . fexofenadine (ALLEGRA) 180 MG tablet Take 180 mg by mouth daily as needed for allergies or rhinitis.    . Fluticasone-Salmeterol (  ADVAIR) 250-50 MCG/DOSE AEPB Inhale 1 puff into the lungs every 12 (twelve) hours.      . furosemide (LASIX) 40 MG tablet Take 40 mg by mouth 2 (two) times daily.      Marland Kitchen gabapentin (NEURONTIN) 100 MG capsule Take 100 mg by mouth daily.     . Glucosamine HCl (GLUCOSAMINE PO) Take 1 tablet by mouth daily.    Marland Kitchen HYDROcodone-acetaminophen (NORCO/VICODIN) 5-325 MG per tablet Take 1 tablet by mouth every 6 (six) hours as needed for moderate pain.    Marland Kitchen ibuprofen (ADVIL,MOTRIN) 200 MG tablet Take 200 mg by mouth every 6 (six) hours as  needed (pain).    Marland Kitchen levothyroxine (SYNTHROID, LEVOTHROID) 200 MCG tablet Take 200-300 mcg by mouth. Brand Name Synthroid Only. Take 1 1/2 tablets (300 mg) on Sundays, take 1 tablet (200 mg) Monday thru Saturday    . Liraglutide 18 MG/3ML SOPN Inject 1.8 mg into the skin daily. Victoza    . lisinopril (PRINIVIL,ZESTRIL) 40 MG tablet Take 40 mg by mouth daily.   3  . LORazepam (ATIVAN) 0.5 MG tablet Take 0.5 mg by mouth at bedtime.     . metoprolol succinate (TOPROL-XL) 100 MG 24 hr tablet Take 100 mg by mouth daily. Take with or immediately following a meal.    . Multiple Vitamin (MULTIVITAMIN WITH MINERALS) TABS tablet Take 1 tablet by mouth daily. One A Day with Iron    . nitroGLYCERIN (NITROSTAT) 0.4 MG SL tablet Place 0.4 mg under the tongue every 5 (five) minutes as needed for chest pain.     Marland Kitchen omeprazole (PRILOSEC OTC) 20 MG tablet Take 20 mg by mouth 2 (two) times daily.    Marland Kitchen senna (SENOKOT) 8.6 MG TABS tablet Take 1 tablet by mouth daily as needed for mild constipation.    . simvastatin (ZOCOR) 40 MG tablet Take 40 mg by mouth at bedtime.      . vitamin B-12 (CYANOCOBALAMIN) 1000 MCG tablet Take 1,000 mcg by mouth daily.       PAST MEDICAL HISTORY: Past Medical History  Diagnosis Date  . Diabetes mellitus   . Obesity   . Diabetes type 2, uncontrolled   . Hypoglycemia associated with diabetes   . Edema of both legs   . Hypertension   . Combined hyperlipidemia   . Acquired autoimmune hypothyroidism   . Thyroiditis, autoimmune   . Abnormal liver function tests   . Sleep apnea, obstructive   . History of gastroesophageal reflux (GERD)   . Depression   . DM neuropathy with neurologic complication   . COPD with acute exacerbation   . Goiter   . Fatigue   . Vertigo   . Type II diabetes mellitus with peripheral angiopathy   . Gout   . Pallor   . Meningioma   . Seizure     PAST SURGICAL HISTORY: Past Surgical History  Procedure Laterality Date  . Laparoscopic gastric  banding    . Back surgery    . Craniotomy Right 05/08/2014    Procedure: CRANIOTOMY FOR MENINGIOMA;  Surgeon: Ashok Pall, MD;  Location: [facility];  Service: Neurosurgery;  Laterality: Right;  Right Craniotomy for meningioma  . Esophagogastroduodenoscopy (egd) with propofol N/A 05/22/2014    Procedure: ESOPHAGOGASTRODUODENOSCOPY (EGD) WITH PROPOFOL;  Surgeon: Wonda Horner, MD;  Location: [facility];  Service: Endoscopy;  Laterality: N/A;  . Brain meningioma excision      FAMILY HISTORY: Family History  Problem Relation Age of Onset  .  Diabetes Father   . Diabetes Sister   . Diabetes Brother   . Melanoma Mother     SOCIAL HISTORY:  History   Social History  . Marital Status: Married    Spouse Name: N/A  . Number of Children: 2  . Years of Education: 12   Occupational History  . Retired    Social History Main Topics  . Smoking status: Never Smoker   . Smokeless tobacco: Not on file  . Alcohol Use: No  . Drug Use: No  . Sexual Activity: No   Other Topics Concern  . Not on file   Social History Narrative   Currently in nursing home for rehab.   Right-handed.   No caffeine use.     PHYSICAL EXAM   Filed Vitals:   07/23/14 1311  BP: 156/78  Pulse: 61    Not recorded      There is no weight on file to calculate BMI.  PHYSICAL EXAMNIATION:  Gen: NAD, conversant, well nourised, obese, well groomed                     Cardiovascular: Regular rate rhythm, no peripheral edema, warm, nontender. Eyes: Conjunctivae clear without exudates or hemorrhage Neck: Supple, no carotid bruise. Pulmonary: Clear to auscultation bilaterally   NEUROLOGICAL EXAM:  MENTAL STATUS: Speech:    Speech is normal; fluent and spontaneous with normal comprehension.  Cognition:   Mini-Mental Status Examination is 26 out of 30, she missed 3 out of 3 recall, is not oriented date, well-healed right frontal scar,  CRANIAL NERVES: CN II: Visual fields are full to  confrontation. Fundoscopic exam is normal with sharp discs and no vascular changes. Venous pulsations are present bilaterally. Pupils are 4 mm and briskly reactive to light. Visual acuity is 20/20 bilaterally. CN III, IV, VI: extraocular movement are normal. No ptosis. CN V: Facial sensation is intact to pinprick in all 3 divisions bilaterally. Corneal responses are intact.  CN VII: Face is symmetric with normal eye closure and smile. She has a left periorbital bruise CN VIII: Hearing is normal to rubbing fingers CN IX, X: Palate elevates symmetrically. Phonation is normal. CN XI: Head turning and shoulder shrug are intact CN XII: Tongue is midline with normal movements and no atrophy.  MOTOR: There is no pronator drift of out-stretched arms. Muscle bulk and tone are normal. Muscle strength is normal.   Shoulder abduction Shoulder external rotation Elbow flexion Elbow extension Wrist flexion Wrist extension Finger abduction Hip flexion Knee flexion Knee extension Ankle dorsi flexion Ankle plantar flexion  R 5 5 5 5 5 5 5 5 5 5 5 5   L 5 5 5 5 5 5 5 5 5 5 5 5     REFLEXES: Reflexes are 2+ and symmetric at the biceps, triceps, knees, and ankles. Plantar responses are flexor.  SENSORY: Length dependent decreased, pinprick,and vibration sense at toes   COORDINATION: Rapid alternating movements and fine finger movements are intact. There is no dysmetria on finger-to-nose and heel-knee-shin. There are no abnormal or extraneous movements.   GAIT/STANCE: She need assistant to get up from seated position, cautious, wide-based unsteady gait   DIAGNOSTIC DATA (LABS, IMAGING, TESTING) - I reviewed patient records, labs, notes, testing and imaging myself where available.  Lab Results  Component Value Date   WBC 7.1 06/10/2014   HGB 12.2 06/10/2014   HCT 35.0* 06/10/2014   MCV 87.9 06/10/2014   PLT 336 06/10/2014  Component Value Date/Time   NA 127* 06/10/2014 1108   K 4.1 06/10/2014  1108   CL 94* 06/10/2014 1108   CO2 22 06/10/2014 1108   GLUCOSE 276* 06/10/2014 1108   BUN 17 06/10/2014 1108   CREATININE 1.54* 06/10/2014 1108   CREATININE 1.32* 09/11/2011 1110   CALCIUM 9.0 06/10/2014 1108   PROT 5.7* 06/01/2014 0645   ALBUMIN 2.1* 06/01/2014 0645   AST 49* 06/01/2014 0645   ALT 42* 06/01/2014 0645   ALKPHOS 320* 06/01/2014 0645   BILITOT 0.5 06/01/2014 0645   GFRNONAA 32* 06/10/2014 1108   GFRAA 37* 06/10/2014 1108   Lab Results  Component Value Date   CHOL  05/10/2009    132        ATP III CLASSIFICATION:  <200     mg/dL   Desirable  200-239  mg/dL   Borderline High  >=240    mg/dL   High          TRIG 288* 05/25/2014   Lab Results  Component Value Date   HGBA1C 9.4 01/04/2012   No results found for: [id number] Lab Results  Component Value Date   TSH 1.620 06/02/2014    ASSESSMENT AND PLAN  FARDOSA STAUTER is a 74 y.o. female  with history of right frontal meningioma, status post resection May 08 2014, continued evidence of right frontal encephalomalacia, labile diabetes diabetic peripheral neuropathy  1, complex partial seizure, keep current dose of Vimpat 200 mg twice a day, change to Keppra to 500 mg twice a day, document all partial seizure-like event 2. Cognitive impairment, likely due to right frontal encephalomalacia, baseline mild cognitive impairment, also complicated by multiple vascular risk factor, labile diabetes, hypertension, hyperlipidemia, obesity 3, gait difficulty, deconditioning, obesity, diabetic peripheral neuropathy 4. Return to clinic in one month    60 minutes spent in coordinating her care, more than 50% was face-to-face consultation  Marcial Pacas, M.D. Ph.D.  [facility] [address] [place], [place] [address] Ph: [phone] Fax: [phone]

## 2014-07-23 NOTE — Telephone Encounter (Signed)
I called back.  Receptionist said Morgan [name] was not available, and she does not have a voicemail.  Asked that we try again at a later time.

## 2014-07-24 ENCOUNTER — Telehealth: Payer: Self-pay | Admitting: Neurology

## 2014-07-24 NOTE — Telephone Encounter (Signed)
I called again.  Morgan [name] is not working.  Was transferred to nurses desk.  [name] they are unsure why we were called, as there are no notes, and she does not see anything that would have been questioned.  Verified Rx's for Vimpat and Keppra, which were written at [place].  Says they will call us back if anything further is needed.

## 2014-07-24 NOTE — Telephone Encounter (Signed)
Spoke to [place] at [facility] - dosage has already been verified.

## 2014-07-24 NOTE — Telephone Encounter (Signed)
Morgan [name] from [facility] is calling needing clarification on levETIRAcetam (KEPPRA) 500 MG tablet dosage.  Please call and advise.

## 2014-07-28 ENCOUNTER — Ambulatory Visit (INDEPENDENT_AMBULATORY_CARE_PROVIDER_SITE_OTHER): Payer: Medicare Other | Admitting: Neurology

## 2014-07-28 DIAGNOSIS — R569 Unspecified convulsions: Secondary | ICD-10-CM

## 2014-07-28 DIAGNOSIS — D329 Benign neoplasm of meninges, unspecified: Secondary | ICD-10-CM

## 2014-07-28 DIAGNOSIS — R4189 Other symptoms and signs involving cognitive functions and awareness: Secondary | ICD-10-CM

## 2014-07-28 DIAGNOSIS — R269 Unspecified abnormalities of gait and mobility: Secondary | ICD-10-CM

## 2014-07-30 ENCOUNTER — Telehealth: Payer: Self-pay | Admitting: Neurology

## 2014-07-30 NOTE — Telephone Encounter (Signed)
[name], pt's daughter called to follow up on pt's EEG on Tuesday 07/28/14 results. Please call and advice. # [phone]

## 2014-07-31 NOTE — Procedures (Signed)
   HISTORY: 74 year old female, with history of right frontal meningioma, status post resection, continued evidence of right frontal encephalomalacia, intermittent confusion, memory trouble.  TECHNIQUE:  16 channel EEG was performed based on standard 10-16 international system. One channel was dedicated to EKG, which has demonstrates normal sinus rhythm of 66 beats per minutes.  Upon awakening, the posterior background activity was dysrhythmic, in the theta range, reactive to eye opening and closure, she has worsening right frontal area slowing, higher amplitude, 2-5 Hz, with intermittent sharp transient involving C4,   Photic stimulation was performed, which induced increased asymmetry, higher amplitude, right frontal area slowing, increased sharp transients at C4,  Hyperventilation was  not performed  No sleep was achieved.  CONCLUSION: This is a an abnormal EEG.  There is electrodiagnostic evidence of generalized slowing of background activity, in addition, there is evidence of right frontal irritation, consistent with her history of her right frontal encephalomalacia, she is at increased risk for partial seizure.

## 2014-07-31 NOTE — Telephone Encounter (Signed)
I have talked with her daughter, she is now in long-term care facility now,  EEG showed generalized background slowing, right frontal area slowing, irritation, consistent with her history of right encephalomalacia, she is at increased risk for partial seizure, I have suggested her stay on Keppra 500 mg 1 in the morning, 2 at night

## 2014-08-07 ENCOUNTER — Encounter: Payer: Medicare Other | Admitting: Physical Medicine & Rehabilitation

## 2014-08-26 ENCOUNTER — Telehealth: Payer: Self-pay | Admitting: Neurology

## 2014-08-26 NOTE — Telephone Encounter (Signed)
Her daughter does not want her mother to know she called but she felt the information would be helpful.

## 2014-08-26 NOTE — Telephone Encounter (Signed)
[name] returned call. Please call and advise.

## 2014-08-26 NOTE — Telephone Encounter (Signed)
Patients daughter Lynelle Smoke [phone]) called and requested to speak with the nurse regarding some questions she has about her mothers appt tomorrow 5/19. Please call and advise.
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
## 2014-08-26 NOTE — Telephone Encounter (Signed)
Attempted return call - had to leave a message.

## 2014-08-27 ENCOUNTER — Ambulatory Visit (INDEPENDENT_AMBULATORY_CARE_PROVIDER_SITE_OTHER): Payer: Medicare Other | Admitting: Neurology

## 2014-08-27 ENCOUNTER — Encounter: Payer: Self-pay | Admitting: Neurology

## 2014-08-27 VITALS — BP 143/80 | HR 88 | Ht 64.0 in | Wt 221.0 lb

## 2014-08-27 DIAGNOSIS — D329 Benign neoplasm of meninges, unspecified: Secondary | ICD-10-CM | POA: Diagnosis not present

## 2014-08-27 DIAGNOSIS — G8190 Hemiplegia, unspecified affecting unspecified side: Secondary | ICD-10-CM

## 2014-08-27 DIAGNOSIS — G8194 Hemiplegia, unspecified affecting left nondominant side: Secondary | ICD-10-CM

## 2014-08-27 DIAGNOSIS — R4189 Other symptoms and signs involving cognitive functions and awareness: Secondary | ICD-10-CM

## 2014-08-27 DIAGNOSIS — R930 Abnormal findings on diagnostic imaging of skull and head, not elsewhere classified: Secondary | ICD-10-CM

## 2014-08-27 DIAGNOSIS — R569 Unspecified convulsions: Secondary | ICD-10-CM

## 2014-08-27 DIAGNOSIS — R269 Unspecified abnormalities of gait and mobility: Secondary | ICD-10-CM

## 2014-08-27 NOTE — Progress Notes (Signed)
Chief Complaint  Patient presents with  . Memory Loss    MMSE 16/30 - 5 animals. She is here with her husband, Morgan [name] and her daughter, Morgan [name].  They both feel Morgan [name]'s condition has declined. Her daughter call and is now in the memory unit at [facility]. Morgan [name] says she has become more aggressive and agitated. She is accusing her husband of an affair and asking for a divorce attorney. She has been very upset with her family. She threw a cup of water at her roommates bed.  . Meningioma      PATIENT: Morgan [name] DOB: Feb 17, 1941  HISTORICAL  Morgan [name] 74 yo female, is accompanied by her husband, son, daughter, referred by her neurosurgeon Dr. Cyndy Freeze. And primary care Dr. Bea Graff, for evaluation of seizure, confusion, memory trouble  In May 03 2014, she had complex partial seizure, imaging study findings large size right frontal meningioma, had right frontal craniotomy at Dr. Cyndy Freeze May 08 2014, hospital course is complicated by slow recovery, bowel obstruction, she was eventually discharged to rehabilitation early March, planning on to be discharged home soon.  However since her hospital admission, and surgery, she remained confused, over the past few weeks, she has fell few times, with self injury, significant memory trouble, woke up from dreams, acting out of dreams, her family is very worried about taking her back home, because of her confusion, frequent falling episode,  Prior to her hospital admission, she was highly functional, driving, taking care of her husband, who just suffered a severe fall injury at the end of 2015, require prolonged hospital admission, still ambulate with a walker, with limited weightbearing, she has history of labile diabetes, variable glucose level up to 600 hypertension, diabetes, obesity, mild short-term memory trouble over the past few years   She is now taking Vimpat 200 mg twice a day, Keppra 500 mg 3 times a day, there was no  recurrent seizure, she ambulate with a walker  We have reviewed her most recent CAT scan February 2016 Interval right frontal craniotomy for tumor resection. Low-density edema and encephalomalacia in the right frontal lobe from recent surgery. Small area of hyperdensity in the right frontal cortex may be a small amount of blood   MRI of brain January 2016: 5.4 x 2.2 x 2.9 cm right frontal meningioma with associated vasogenic edema and 4 mm of right-to-left midline shift. Mild age-related atrophy with chronic microvascular ischemic Disease.  UPDATE May 19th 2016: She is now in the memory unit at [facility].  Daughter reported that she has become more aggressive and agitated.  She is accusing her husband of an affair and asking for a divorce attorney. She has been very upset with her family.   She threw a cup of water at her roommates bed.   She is taking vimpat 200mg  bid, Keppra 500 mg twice a day, abilify 2 mg was started since May 21st, there was no significant improvement noted,  She is emotional today, crying during the interview, she has  no recurrente sizure,she fell few times, now she has bed alarm   She  also complains of low back and knee pain,  increased headaches, pain of her back  REVIEW OF SYSTEMS: Full 14 system review of systems performed and notable only for as above  ALLERGIES: Allergies  Allergen Reactions  . Latex Rash  . Sulfa Antibiotics Other (See Comments)    Unknown allergic reaction  . Penicillins Rash    HOME MEDICATIONS:  Current Outpatient Prescriptions  Medication Sig Dispense Refill  . ACCU-CHEK SMARTVIEW test strip USE TO TEST BLOOD SUGAR 6 TO 8 TIMES A DAY 200 each 3  . albuterol (PROVENTIL,VENTOLIN) 90 MCG/ACT inhaler Inhale 2 puffs into the lungs 4 (four) times daily as needed for wheezing or shortness of breath.     . anastrozole (ARIMIDEX) 1 MG tablet Take 1 mg by mouth daily.      Marland Kitchen aspirin 81 MG chewable tablet Chew 81 mg by mouth daily.       . Calcium Carbonate-Vitamin D (CALTRATE 600+D PO) Take 1 tablet by mouth 2 (two) times daily.     . cloNIDine (CATAPRES) 0.3 MG tablet Take 0.3 mg by mouth 2 (two) times daily.      . colchicine (COLCRYS) 0.6 MG tablet Take 0.6 mg by mouth 2 (two) times daily as needed (gout).     . DULoxetine (CYMBALTA) 60 MG capsule Take 60 mg by mouth 2 (two) times daily.     . febuxostat (ULORIC) 40 MG tablet Take 40 mg by mouth daily.     . felodipine (PLENDIL) 2.5 MG 24 hr tablet Take 2.5 mg by mouth daily.      . fexofenadine (ALLEGRA) 180 MG tablet Take 180 mg by mouth daily as needed for allergies or rhinitis.    . Fluticasone-Salmeterol (ADVAIR) 250-50 MCG/DOSE AEPB Inhale 1 puff into the lungs every 12 (twelve) hours.      . furosemide (LASIX) 40 MG tablet Take 40 mg by mouth 2 (two) times daily.      Marland Kitchen gabapentin (NEURONTIN) 100 MG capsule Take 100 mg by mouth daily.     . Glucosamine HCl (GLUCOSAMINE PO) Take 1 tablet by mouth daily.    Marland Kitchen HYDROcodone-acetaminophen (NORCO/VICODIN) 5-325 MG per tablet Take 1 tablet by mouth every 6 (six) hours as needed for moderate pain.    Marland Kitchen ibuprofen (ADVIL,MOTRIN) 200 MG tablet Take 200 mg by mouth every 6 (six) hours as needed (pain).    Marland Kitchen levothyroxine (SYNTHROID, LEVOTHROID) 200 MCG tablet Take 200-300 mcg by mouth. Brand Name Synthroid Only. Take 1 1/2 tablets (300 mg) on Sundays, take 1 tablet (200 mg) Monday thru Saturday    . Liraglutide 18 MG/3ML SOPN Inject 1.8 mg into the skin daily. Victoza    . lisinopril (PRINIVIL,ZESTRIL) 40 MG tablet Take 40 mg by mouth daily.   3  . LORazepam (ATIVAN) 0.5 MG tablet Take 0.5 mg by mouth at bedtime.     . metoprolol succinate (TOPROL-XL) 100 MG 24 hr tablet Take 100 mg by mouth daily. Take with or immediately following a meal.    . Multiple Vitamin (MULTIVITAMIN WITH MINERALS) TABS tablet Take 1 tablet by mouth daily. One A Day with Iron    . nitroGLYCERIN (NITROSTAT) 0.4 MG SL tablet Place 0.4 mg under the tongue  every 5 (five) minutes as needed for chest pain.     Marland Kitchen omeprazole (PRILOSEC OTC) 20 MG tablet Take 20 mg by mouth 2 (two) times daily.    Marland Kitchen senna (SENOKOT) 8.6 MG TABS tablet Take 1 tablet by mouth daily as needed for mild constipation.    . simvastatin (ZOCOR) 40 MG tablet Take 40 mg by mouth at bedtime.      . vitamin B-12 (CYANOCOBALAMIN) 1000 MCG tablet Take 1,000 mcg by mouth daily.       PAST MEDICAL HISTORY: Past Medical History  Diagnosis Date  . Diabetes mellitus   . Obesity   .  Diabetes type 2, uncontrolled   . Hypoglycemia associated with diabetes   . Edema of both legs   . Hypertension   . Combined hyperlipidemia   . Acquired autoimmune hypothyroidism   . Thyroiditis, autoimmune   . Abnormal liver function tests   . Sleep apnea, obstructive   . History of gastroesophageal reflux (GERD)   . Depression   . DM neuropathy with neurologic complication   . COPD with acute exacerbation   . Goiter   . Fatigue   . Vertigo   . Type II diabetes mellitus with peripheral angiopathy   . Gout   . Pallor   . Meningioma   . Seizure     PAST SURGICAL HISTORY: Past Surgical History  Procedure Laterality Date  . Laparoscopic gastric banding    . Back surgery    . Craniotomy Right 05/08/2014    Procedure: CRANIOTOMY FOR MENINGIOMA;  Surgeon: Ashok Pall, MD;  Location: [facility];  Service: Neurosurgery;  Laterality: Right;  Right Craniotomy for meningioma  . Esophagogastroduodenoscopy (egd) with propofol N/A 05/22/2014    Procedure: ESOPHAGOGASTRODUODENOSCOPY (EGD) WITH PROPOFOL;  Surgeon: Wonda Horner, MD;  Location: [facility];  Service: Endoscopy;  Laterality: N/A;  . Brain meningioma excision      FAMILY HISTORY: Family History  Problem Relation Age of Onset  . Diabetes Father   . Diabetes Sister   . Diabetes Brother   . Melanoma Mother     SOCIAL HISTORY:  History   Social History  . Marital Status: Married    Spouse Name: N/A  . Number of Children: 2    . Years of Education: 12   Occupational History  . Retired    Social History Main Topics  . Smoking status: Never Smoker   . Smokeless tobacco: Not on file  . Alcohol Use: No  . Drug Use: No  . Sexual Activity: No   Other Topics Concern  . Not on file   Social History Narrative   Currently in nursing home for rehab.   Right-handed.   No caffeine use.     PHYSICAL EXAM   Filed Vitals:   08/27/14 1415  BP: 143/80  Pulse: 88  Height: 5\' 4"  (1.626 m)  Weight: 221 lb (100.245 kg)    Not recorded      Body mass index is 37.92 kg/(m^2).  PHYSICAL EXAMNIATION:  Gen: NAD, conversant, well nourised, obese, well groomed                     Cardiovascular: Regular rate rhythm, no peripheral edema, warm, nontender. Eyes: Conjunctivae clear without exudates or hemorrhage Neck: Supple, no carotid bruise. Pulmonary: Clear to auscultation bilaterally   NEUROLOGICAL EXAM:  MENTAL STATUS: Speech:    Speech is normal; fluent and spontaneous with normal comprehension.  Cognition:   Mini-Mental Status Examination is 16 out of 30, she is not oriented to time, place, could not spell world backwards, she missed 3 out of 3 recall, is not oriented date, well-healed right frontal scar, she could not write a sentence,or copy a design  CRANIAL NERVES: CN II: Visual fields are full to confrontation. Fundoscopic exam is normal with sharp discs and no vascular changes. Venous pulsations are present bilaterally. Pupils are 4 mm and briskly reactive to light. Visual acuity is 20/20 bilaterally. CN III, IV, VI: extraocular movement are normal. No ptosis. CN V: Facial sensation is intact to pinprick in all 3 divisions bilaterally. Corneal responses are intact.  CN VII: Face is symmetric with normal eye closure and smile. She has a left periorbital bruise CN VIII: Hearing is normal to rubbing fingers CN IX, X: Palate elevates symmetrically. Phonation is normal. CN XI: Head turning and  shoulder shrug are intact CN XII: Tongue is midline with normal movements and no atrophy.  MOTOR: She has mild lateral hands posturing tremor, limb rigidity, left arm fixation on rapid rotating movement  REFLEXES: Reflexes are 2+ and symmetric at the biceps, triceps, knees, and ankles. Plantar responses are flexor.  SENSORY: Length dependent decreased, pinprick,and vibration sense at toes   COORDINATION: Rapid alternating movements and fine finger movements are intact. There is no dysmetria on finger-to-nose and heel-knee-shin. There are no abnormal or extraneous movements.   GAIT/STANCE: She need assistant to get up from seated position, difficulty initiate gait, cautious, wide-based unsteady gait   DIAGNOSTIC DATA (LABS, IMAGING, TESTING) - I reviewed patient records, labs, notes, testing and imaging myself where available.  Lab Results  Component Value Date   WBC 7.1 06/10/2014   HGB 12.2 06/10/2014   HCT 35.0* 06/10/2014   MCV 87.9 06/10/2014   PLT 336 06/10/2014      Component Value Date/Time   NA 127* 06/10/2014 1108   K 4.1 06/10/2014 1108   CL 94* 06/10/2014 1108   CO2 22 06/10/2014 1108   GLUCOSE 276* 06/10/2014 1108   BUN 17 06/10/2014 1108   CREATININE 1.54* 06/10/2014 1108   CREATININE 1.32* 09/11/2011 1110   CALCIUM 9.0 06/10/2014 1108   PROT 5.7* 06/01/2014 0645   ALBUMIN 2.1* 06/01/2014 0645   AST 49* 06/01/2014 0645   ALT 42* 06/01/2014 0645   ALKPHOS 320* 06/01/2014 0645   BILITOT 0.5 06/01/2014 0645   GFRNONAA 32* 06/10/2014 1108   GFRAA 37* 06/10/2014 1108   Lab Results  Component Value Date   CHOL  05/10/2009    132        ATP III CLASSIFICATION:  <200     mg/dL   Desirable  200-239  mg/dL   Borderline High  >=240    mg/dL   High          TRIG 288* 05/25/2014   Lab Results  Component Value Date   HGBA1C 9.4 01/04/2012   No results found for: [id number] Lab Results  Component Value Date   TSH 1.620 06/02/2014    ASSESSMENT AND  PLAN  Morgan [name] is a 74 y.o. female  with history of right frontal meningioma, status post resection May 08 2014, continued evidence of right frontal encephalomalacia, labile diabetes diabetic peripheral neuropathy  1, complex partial seizure, she has no recurrent seizure, keep Vimpat 200 mg twice a day, stop Keppra, 2. Cognitive impairment, likely due to right frontal encephalomalacia, baseline mild cognitive impairment, also complicated by multiple vascular risk factor, labile diabetes, hypertension, hyperlipidemia, obesity,  3, gait difficulty, deconditioning, obesity, diabetic peripheral neuropathy 4. She has increased depression, anxiety, crying during the visit, move abilify 2 mg to evening time,stop Mucinex, Hydrocodone  Marcial Pacas, M.D. Ph.D.  [facility] [address] [place], [place] [address] Ph: [phone] Fax: [phone]

## 2014-08-28 ENCOUNTER — Telehealth: Payer: Self-pay | Admitting: Neurology

## 2014-08-28 NOTE — Telephone Encounter (Signed)
Spoke with Maudie Mercury, gave Vimpat taper instructions per Dr. Guadelupe Sabin previous note. Kim verbalized understanding. She said she will be off on Monday, but would like for someone to call the facility for patient's update and to discuss Keppra.

## 2014-08-28 NOTE — Telephone Encounter (Signed)
Kim with [employer] is calling regarding the patient. The patient saw Dr. Krista Blue yesterday and Maudie Mercury needs verification on the orders and medications. Please call. Thank you.

## 2014-08-28 NOTE — Telephone Encounter (Signed)
Please call back and advise Morgan Mercury, RN, to taper off Vimpat over the weekend. If she has taken this morning's 200 mg dose, give her 100 mg at night today and tomorrow she can take 100 mg twice daily, day after tomorrow she can take 100 mg at night only and then stop. Please call back on Monday to report an update and also to discuss potential increase in Keppra in lieu of Vimpat. I will copy Dr. Krista Blue on this note.

## 2014-08-28 NOTE — Telephone Encounter (Signed)
Spoke to [employer], RN @ [employer]. She states after reviewing the patient's instructions from her visit with Dr. Krista Blue yesterday she would like to inform Dr. Krista Blue when this patient was transitioned from [facility] facility to their facility she went without Vimpat for 4 days because the medication was not in stock. The patient did not present any violent behavior, agitation, or suicidal thoughts. After the 3rd day of being on Vimpat the patient's behavior changed back to the behavior listed from the family, from [facility] facility and mentioned in Dr. Rhea Belton previous note. The patient has never stopped Keppra. Kim informed the family of their observation yesterday evening and they asked if she would call [facility] to see if it is possible for patient to stay on Keppra 500mg  1 tab in a.m, 2 tabs qhs, and stop the Vimpat.

## 2014-08-31 NOTE — Telephone Encounter (Signed)
Spoke to [name] ([employer]) - she wanted to make sure of the medication change.  Verified instructions and she verbalized understanding.

## 2014-08-31 NOTE — Telephone Encounter (Signed)
I have talked with nurse Etheleen Nicks at her facility, she has stopped vimpat, only take keppra 500mg  once a day , she has no recurrent seizure, she still has her moment.  I have advised her to increase keppra 500mg  to one in the morning, 2 tabs po qhs.

## 2014-08-31 NOTE — Telephone Encounter (Addendum)
Pt called stating Dr Krista Blue had left a message for her to call.  Please call and advise. Pt can be reached @ [phone].

## 2014-09-25 ENCOUNTER — Telehealth: Payer: Self-pay | Admitting: Neurology

## 2014-09-25 NOTE — Telephone Encounter (Signed)
Patients daughter called and requested to move appt. She really needs to be seen on the week after July 4th. Please call and advise.

## 2014-09-28 NOTE — Telephone Encounter (Signed)
Appointment moved to the requested week.

## 2014-09-30 ENCOUNTER — Ambulatory Visit: Payer: Medicare Other | Admitting: Neurology

## 2014-10-21 ENCOUNTER — Ambulatory Visit (INDEPENDENT_AMBULATORY_CARE_PROVIDER_SITE_OTHER): Payer: Medicare Other | Admitting: Neurology

## 2014-10-21 ENCOUNTER — Encounter: Payer: Self-pay | Admitting: Neurology

## 2014-10-21 VITALS — BP 124/77 | HR 66

## 2014-10-21 DIAGNOSIS — R4189 Other symptoms and signs involving cognitive functions and awareness: Secondary | ICD-10-CM | POA: Diagnosis not present

## 2014-10-21 DIAGNOSIS — R269 Unspecified abnormalities of gait and mobility: Secondary | ICD-10-CM

## 2014-10-21 DIAGNOSIS — D329 Benign neoplasm of meninges, unspecified: Secondary | ICD-10-CM | POA: Diagnosis not present

## 2014-10-21 DIAGNOSIS — R569 Unspecified convulsions: Secondary | ICD-10-CM
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
## 2014-12-17 ENCOUNTER — Other Ambulatory Visit: Payer: Self-pay | Admitting: Neurosurgery

## 2014-12-17 DIAGNOSIS — D329 Benign neoplasm of meninges, unspecified: Secondary | ICD-10-CM

## 2014-12-23 ENCOUNTER — Other Ambulatory Visit: Payer: Medicare Other

## 2015-01-06 ENCOUNTER — Ambulatory Visit
Admission: RE | Admit: 2015-01-06 | Discharge: 2015-01-06 | Disposition: A | Discharge status: Home / Self Care | Payer: Medicare Other | Source: Ambulatory Visit | Attending: Neurosurgery | Admitting: Neurosurgery

## 2015-01-06 DIAGNOSIS — D329 Benign neoplasm of meninges, unspecified: Secondary | ICD-10-CM

## 2015-01-06 MED ORDER — GADOBENATE DIMEGLUMINE 529 MG/ML IV SOLN
18.0000 mL | Freq: Once | INTRAVENOUS | Status: AC | PRN
  Administered 2015-01-06: 18 mL via INTRAVENOUS

## 2015-01-13 ENCOUNTER — Telehealth: Payer: Self-pay | Admitting: Neurology

## 2015-01-13 MED ORDER — LEVETIRACETAM 500 MG PO TABS: ORAL_TABLET | ORAL | Status: DC

## 2015-01-13 NOTE — Telephone Encounter (Signed)
Pt's daughter called and pt's need refill on levETIRAcetam (KEPPRA) 500 MG tablet. Thank you . Please use the [facility]

## 2015-01-13 NOTE — Telephone Encounter (Signed)
It appears a 1 year Rx was already sent to [facility] in April, however I have resent the prescription.  Receipt confirmed by pharmacy.

## 2015-01-15 ENCOUNTER — Emergency Department (HOSPITAL_COMMUNITY): Payer: Medicare Other

## 2015-01-15 ENCOUNTER — Inpatient Hospital Stay (HOSPITAL_COMMUNITY)
Admission: EM | Admit: 2015-01-15 | Discharge: 2015-01-18 | DRG: 308 | Disposition: A | Discharge status: Home Health | Payer: Medicare Other | Attending: Cardiovascular Disease | Admitting: Cardiovascular Disease

## 2015-01-15 ENCOUNTER — Encounter (HOSPITAL_COMMUNITY): Payer: Self-pay | Admitting: Emergency Medicine

## 2015-01-15 DIAGNOSIS — I5021 Acute systolic (congestive) heart failure: Secondary | ICD-10-CM | POA: Diagnosis present

## 2015-01-15 DIAGNOSIS — K219 Gastro-esophageal reflux disease without esophagitis: Secondary | ICD-10-CM | POA: Diagnosis present

## 2015-01-15 DIAGNOSIS — Z79899 Other long term (current) drug therapy: Secondary | ICD-10-CM | POA: Diagnosis not present

## 2015-01-15 DIAGNOSIS — I110 Hypertensive heart disease with heart failure: Secondary | ICD-10-CM | POA: Diagnosis present

## 2015-01-15 DIAGNOSIS — Z794 Long term (current) use of insulin: Secondary | ICD-10-CM | POA: Diagnosis not present

## 2015-01-15 DIAGNOSIS — I4892 Unspecified atrial flutter: Secondary | ICD-10-CM | POA: Diagnosis present

## 2015-01-15 DIAGNOSIS — R0602 Shortness of breath: Secondary | ICD-10-CM | POA: Diagnosis present

## 2015-01-15 DIAGNOSIS — E782 Mixed hyperlipidemia: Secondary | ICD-10-CM | POA: Diagnosis present

## 2015-01-15 DIAGNOSIS — Z86011 Personal history of benign neoplasm of the brain: Secondary | ICD-10-CM

## 2015-01-15 DIAGNOSIS — G4733 Obstructive sleep apnea (adult) (pediatric): Secondary | ICD-10-CM | POA: Diagnosis present

## 2015-01-15 DIAGNOSIS — F329 Major depressive disorder, single episode, unspecified: Secondary | ICD-10-CM | POA: Diagnosis present

## 2015-01-15 DIAGNOSIS — J9811 Atelectasis: Secondary | ICD-10-CM | POA: Diagnosis present

## 2015-01-15 DIAGNOSIS — Z79811 Long term (current) use of aromatase inhibitors: Secondary | ICD-10-CM | POA: Diagnosis not present

## 2015-01-15 DIAGNOSIS — Z882 Allergy status to sulfonamides status: Secondary | ICD-10-CM

## 2015-01-15 DIAGNOSIS — E1140 Type 2 diabetes mellitus with diabetic neuropathy, unspecified: Secondary | ICD-10-CM | POA: Diagnosis present

## 2015-01-15 DIAGNOSIS — I509 Heart failure, unspecified: Secondary | ICD-10-CM

## 2015-01-15 DIAGNOSIS — Z880 Allergy status to penicillin: Secondary | ICD-10-CM | POA: Diagnosis not present

## 2015-01-15 DIAGNOSIS — Z91040 Latex allergy status: Secondary | ICD-10-CM

## 2015-01-15 DIAGNOSIS — I5031 Acute diastolic (congestive) heart failure: Secondary | ICD-10-CM | POA: Diagnosis present

## 2015-01-15 DIAGNOSIS — R079 Chest pain, unspecified: Secondary | ICD-10-CM

## 2015-01-15 DIAGNOSIS — Z6832 Body mass index (BMI) 32.0-32.9, adult: Secondary | ICD-10-CM

## 2015-01-15 HISTORY — DX: Malignant (primary) neoplasm, unspecified: C80.1

## 2015-01-15 LAB — URINALYSIS, ROUTINE W REFLEX MICROSCOPIC
BILIRUBIN URINE: NEGATIVE
GLUCOSE, UA: 100 mg/dL — AB
HGB URINE DIPSTICK: NEGATIVE
KETONES UR: NEGATIVE mg/dL
LEUKOCYTES UA: NEGATIVE
Nitrite: NEGATIVE
PH: 5 (ref 5.0–8.0)
PROTEIN: NEGATIVE mg/dL
Specific Gravity, Urine: 1.013 (ref 1.005–1.030)
Urobilinogen, UA: 0.2 mg/dL (ref 0.0–1.0)

## 2015-01-15 LAB — CBC WITH DIFFERENTIAL/PLATELET
BASOS ABS: 0 10*3/uL (ref 0.0–0.1)
Basophils Relative: 0 %
EOS ABS: 0.1 10*3/uL (ref 0.0–0.7)
Eosinophils Relative: 1 %
HCT: 40.1 % (ref 36.0–46.0)
HEMOGLOBIN: 13.2 g/dL (ref 12.0–15.0)
LYMPHS ABS: 3.3 10*3/uL (ref 0.7–4.0)
LYMPHS PCT: 31 %
MCH: 30.1 pg (ref 26.0–34.0)
MCHC: 32.9 g/dL (ref 30.0–36.0)
MCV: 91.3 fL (ref 78.0–100.0)
Monocytes Absolute: 0.4 10*3/uL (ref 0.1–1.0)
Monocytes Relative: 4 %
NEUTROS PCT: 64 %
Neutro Abs: 6.7 10*3/uL (ref 1.7–7.7)
PLATELETS: 233 10*3/uL (ref 150–400)
RBC: 4.39 MIL/uL (ref 3.87–5.11)
RDW: 13.5 % (ref 11.5–15.5)
WBC: 10.4 10*3/uL (ref 4.0–10.5)

## 2015-01-15 LAB — CBC
HCT: 39.7 % (ref 36.0–46.0)
Hemoglobin: 13.1 g/dL (ref 12.0–15.0)
MCH: 29.8 pg (ref 26.0–34.0)
MCHC: 33 g/dL (ref 30.0–36.0)
MCV: 90.4 fL (ref 78.0–100.0)
PLATELETS: 229 10*3/uL (ref 150–400)
RBC: 4.39 MIL/uL (ref 3.87–5.11)
RDW: 13.4 % (ref 11.5–15.5)
WBC: 11.1 10*3/uL — AB (ref 4.0–10.5)

## 2015-01-15 LAB — BASIC METABOLIC PANEL
ANION GAP: 11 (ref 5–15)
BUN: 40 mg/dL — AB (ref 6–20)
CALCIUM: 8.5 mg/dL — AB (ref 8.9–10.3)
CO2: 26 mmol/L (ref 22–32)
CREATININE: 1.56 mg/dL — AB (ref 0.44–1.00)
Chloride: 103 mmol/L (ref 101–111)
GFR calc Af Amer: 37 mL/min — ABNORMAL LOW (ref >60)
GFR, EST NON AFRICAN AMERICAN: 32 mL/min — AB (ref >60)
GLUCOSE: 300 mg/dL — AB (ref 65–99)
Potassium: 3.7 mmol/L (ref 3.5–5.1)
Sodium: 140 mmol/L (ref 135–145)

## 2015-01-15 LAB — GLUCOSE, CAPILLARY
Glucose-Capillary: 216 mg/dL — ABNORMAL HIGH (ref 65–99)
Glucose-Capillary: 299 mg/dL — ABNORMAL HIGH (ref 65–99)

## 2015-01-15 LAB — TSH: TSH: 4.416 u[IU]/mL (ref 0.350–4.500)

## 2015-01-15 LAB — BRAIN NATRIURETIC PEPTIDE: B NATRIURETIC PEPTIDE 5: 480 pg/mL — AB (ref 0.0–100.0)

## 2015-01-15 LAB — MAGNESIUM: MAGNESIUM: 1.7 mg/dL (ref 1.7–2.4)

## 2015-01-15 LAB — TROPONIN I
Troponin I: <0.03 ng/mL (ref <0.031)
Troponin I: 0.03 ng/mL (ref <0.031)

## 2015-01-15 LAB — I-STAT TROPONIN, ED: Troponin i, poc: 0 ng/mL (ref 0.00–0.08)

## 2015-01-15 MED ORDER — FUROSEMIDE 10 MG/ML IJ SOLN
40.0000 mg | Freq: Two times a day (BID) | INTRAMUSCULAR | Status: AC
  Administered 2015-01-15 – 2015-01-16 (×2): 40 mg via INTRAVENOUS
  Filled 2015-01-15 (×2): qty 4

## 2015-01-15 MED ORDER — DULOXETINE HCL 60 MG PO CPEP
60.0000 mg | ORAL_CAPSULE | Freq: Two times a day (BID) | ORAL | Status: DC
  Administered 2015-01-15 – 2015-01-18 (×6): 60 mg via ORAL
  Filled 2015-01-15 (×6): qty 1

## 2015-01-15 MED ORDER — BIOTENE DRY MOUTH MT LIQD: 15.0000 mL | Freq: Every day | OROMUCOSAL | Status: DC | PRN

## 2015-01-15 MED ORDER — HYDROCODONE-ACETAMINOPHEN 5-325 MG PO TABS
1.0000 | ORAL_TABLET | Freq: Four times a day (QID) | ORAL | Status: DC | PRN
  Administered 2015-01-15 – 2015-01-16 (×2): 1 via ORAL
  Filled 2015-01-15 (×2): qty 1

## 2015-01-15 MED ORDER — VITAMIN D 1000 UNITS PO TABS
1000.0000 [IU] | ORAL_TABLET | Freq: Every day | ORAL | Status: DC
  Administered 2015-01-16 – 2015-01-18 (×3): 1000 [IU] via ORAL
  Filled 2015-01-15 (×3): qty 1

## 2015-01-15 MED ORDER — INSULIN ASPART 100 UNIT/ML ~~LOC~~ SOLN
0.0000 [IU] | Freq: Three times a day (TID) | SUBCUTANEOUS | Status: DC
Start: 2015-01-15 — End: 2015-01-18
  Administered 2015-01-15: 5 [IU] via SUBCUTANEOUS
  Administered 2015-01-16: 8 [IU] via SUBCUTANEOUS
  Administered 2015-01-16 – 2015-01-17 (×3): 3 [IU] via SUBCUTANEOUS
  Administered 2015-01-17: 2 [IU] via SUBCUTANEOUS
  Administered 2015-01-18: 3 [IU] via SUBCUTANEOUS
  Administered 2015-01-18: 2 [IU] via SUBCUTANEOUS

## 2015-01-15 MED ORDER — LEVOTHYROXINE SODIUM 100 MCG PO TABS
100.0000 ug | ORAL_TABLET | Freq: Every day | ORAL | Status: DC
  Administered 2015-01-16 – 2015-01-18 (×2): 100 ug via ORAL
  Filled 2015-01-15 (×2): qty 1

## 2015-01-15 MED ORDER — METOPROLOL TARTRATE 50 MG PO TABS
50.0000 mg | ORAL_TABLET | Freq: Two times a day (BID) | ORAL | Status: DC
  Administered 2015-01-15 – 2015-01-18 (×6): 50 mg via ORAL
  Filled 2015-01-15 (×6): qty 1

## 2015-01-15 MED ORDER — LEVETIRACETAM 500 MG PO TABS
500.0000 mg | ORAL_TABLET | Freq: Every morning | ORAL | Status: DC
  Administered 2015-01-16 – 2015-01-18 (×3): 500 mg via ORAL
  Filled 2015-01-15 (×3): qty 1

## 2015-01-15 MED ORDER — HEPARIN BOLUS VIA INFUSION
4000.0000 [IU] | Freq: Once | INTRAVENOUS | Status: AC
  Administered 2015-01-15: 4000 [IU] via INTRAVENOUS
  Filled 2015-01-15: qty 4000

## 2015-01-15 MED ORDER — HEPARIN (PORCINE) IN NACL 100-0.45 UNIT/ML-% IJ SOLN
900.0000 [IU]/h | INTRAMUSCULAR | Status: DC
  Administered 2015-01-15: 1200 [IU]/h via INTRAVENOUS
  Administered 2015-01-16 – 2015-01-17 (×2): 900 [IU]/h via INTRAVENOUS
  Filled 2015-01-15 (×3): qty 250

## 2015-01-15 MED ORDER — COLCHICINE 0.6 MG PO TABS
0.6000 mg | ORAL_TABLET | Freq: Every day | ORAL | Status: DC
  Administered 2015-01-15 – 2015-01-18 (×4): 0.6 mg via ORAL
  Filled 2015-01-15 (×4): qty 1

## 2015-01-15 MED ORDER — ARIPIPRAZOLE 10 MG PO TABS
5.0000 mg | ORAL_TABLET | Freq: Every morning | ORAL | Status: DC
  Administered 2015-01-16 – 2015-01-18 (×3): 5 mg via ORAL
  Filled 2015-01-15 (×3): qty 1

## 2015-01-15 MED ORDER — MAGNESIUM HYDROXIDE 400 MG/5ML PO SUSP
30.0000 mL | Freq: Every day | ORAL | Status: DC | PRN
  Filled 2015-01-15: qty 30

## 2015-01-15 MED ORDER — DILTIAZEM HCL 100 MG IV SOLR
5.0000 mg/h | Freq: Once | INTRAVENOUS | Status: AC
  Administered 2015-01-15 (×2): 5 mg/h via INTRAVENOUS
  Filled 2015-01-15: qty 100

## 2015-01-15 MED ORDER — ONDANSETRON HCL 4 MG PO TABS: 4.0000 mg | ORAL_TABLET | ORAL | Status: DC | PRN

## 2015-01-15 MED ORDER — ONDANSETRON HCL 4 MG/2ML IJ SOLN: 4.0000 mg | Freq: Four times a day (QID) | INTRAMUSCULAR | Status: DC | PRN

## 2015-01-15 MED ORDER — SODIUM CHLORIDE 0.9 % IV SOLN: 250.0000 mL | INTRAVENOUS | Status: DC | PRN

## 2015-01-15 MED ORDER — ASPIRIN EC 81 MG PO TBEC
81.0000 mg | DELAYED_RELEASE_TABLET | Freq: Every day | ORAL | Status: DC
  Administered 2015-01-15 – 2015-01-18 (×4): 81 mg via ORAL
  Filled 2015-01-15 (×4): qty 1

## 2015-01-15 MED ORDER — ALPRAZOLAM 0.5 MG PO TABS: 0.5000 mg | ORAL_TABLET | Freq: Three times a day (TID) | ORAL | Status: DC | PRN

## 2015-01-15 MED ORDER — PANTOPRAZOLE SODIUM 40 MG PO TBEC
40.0000 mg | DELAYED_RELEASE_TABLET | Freq: Every day | ORAL | Status: DC
  Administered 2015-01-15 – 2015-01-18 (×4): 40 mg via ORAL
  Filled 2015-01-15 (×4): qty 1

## 2015-01-15 MED ORDER — LEVETIRACETAM 500 MG PO TABS
1000.0000 mg | ORAL_TABLET | Freq: Every day | ORAL | Status: DC
  Administered 2015-01-15 – 2015-01-17 (×3): 1000 mg via ORAL
  Filled 2015-01-15 (×3): qty 2

## 2015-01-15 MED ORDER — SODIUM CHLORIDE 0.9 % IJ SOLN: 3.0000 mL | INTRAMUSCULAR | Status: DC | PRN

## 2015-01-15 MED ORDER — CLONIDINE HCL 0.3 MG/24HR TD PTWK: 0.3000 mg | MEDICATED_PATCH | TRANSDERMAL | Status: DC

## 2015-01-15 MED ORDER — SODIUM CHLORIDE 0.9 % IJ SOLN
3.0000 mL | Freq: Two times a day (BID) | INTRAMUSCULAR | Status: DC
  Administered 2015-01-15 – 2015-01-17 (×4): 3 mL via INTRAVENOUS

## 2015-01-15 MED ORDER — SENNA 8.6 MG PO TABS
2.0000 | ORAL_TABLET | Freq: Every day | ORAL | Status: DC
  Administered 2015-01-15 – 2015-01-17 (×3): 17.2 mg via ORAL
  Filled 2015-01-15 (×3): qty 2

## 2015-01-15 MED ORDER — MOMETASONE FURO-FORMOTEROL FUM 100-5 MCG/ACT IN AERO
2.0000 | INHALATION_SPRAY | Freq: Two times a day (BID) | RESPIRATORY_TRACT | Status: DC
  Administered 2015-01-16 – 2015-01-18 (×4): 2 via RESPIRATORY_TRACT
  Filled 2015-01-15 (×2): qty 8.8

## 2015-01-15 MED ORDER — DILTIAZEM HCL 100 MG IV SOLR
INTRAVENOUS | Status: AC
  Administered 2015-01-15
  Filled 2015-01-15: qty 100

## 2015-01-15 MED ORDER — NYSTATIN 100000 UNIT/GM EX POWD
Freq: Three times a day (TID) | CUTANEOUS | Status: DC
  Administered 2015-01-15 – 2015-01-18 (×7): via TOPICAL
  Filled 2015-01-15: qty 15

## 2015-01-15 MED ORDER — LIRAGLUTIDE 18 MG/3ML ~~LOC~~ SOPN
1.8000 mg | PEN_INJECTOR | Freq: Every day | SUBCUTANEOUS | Status: DC
Start: 2015-01-15 — End: 2015-01-18
  Administered 2015-01-16 – 2015-01-18 (×3): 1.8 mg via SUBCUTANEOUS

## 2015-01-15 MED ORDER — INSULIN GLARGINE 100 UNIT/ML ~~LOC~~ SOLN
20.0000 [IU] | Freq: Every day | SUBCUTANEOUS | Status: DC
  Administered 2015-01-15: 20 [IU] via SUBCUTANEOUS
  Filled 2015-01-15 (×2): qty 0.2

## 2015-01-15 MED ORDER — ONDANSETRON HCL 4 MG PO TABS: 4.0000 mg | ORAL_TABLET | Freq: Four times a day (QID) | ORAL | Status: DC | PRN

## 2015-01-15 MED ORDER — INSULIN ASPART 100 UNIT/ML ~~LOC~~ SOLN
3.0000 [IU] | Freq: Three times a day (TID) | SUBCUTANEOUS | Status: DC
  Administered 2015-01-15 – 2015-01-18 (×8): 3 [IU] via SUBCUTANEOUS

## 2015-01-15 MED ORDER — CLONIDINE HCL 0.3 MG/24HR TD PTWK
0.3000 mg | MEDICATED_PATCH | TRANSDERMAL | Status: DC
  Administered 2015-01-15: 0.3 mg via TRANSDERMAL
  Filled 2015-01-15: qty 1

## 2015-01-15 MED ORDER — ACETAMINOPHEN 325 MG PO TABS: 650.0000 mg | ORAL_TABLET | ORAL | Status: DC | PRN

## 2015-01-15 NOTE — ED Notes (Signed)
Pt came per EMS with c/o SOB, feeling of heart racing, and pressure in the chest when breathing at times. Upon EMS arrival pt was in atrial fib, was pale and clammy. EMS gave 10 Cardizem x2 and 15 Cardizem is hanging in the 565ml NS bag at bedside. Pt denies pain radiating into neck or across chest. Pt states she is N/ and feels SOB on exertion.

## 2015-01-15 NOTE — Progress Notes (Signed)
Patient's HR 60s EKG reads 4:1 A-flutter, notified Dr. Eddie North as Cardizem will need re-ordered if it is to continue. No new orders at this time.

## 2015-01-15 NOTE — ED Notes (Signed)
Left extremity restriction. DNR at bedside

## 2015-01-15 NOTE — Progress Notes (Signed)
No new orders for Cardizem drip, titrated down to 66ml/hr and hung a new bag HR 64 in A-flutter at this time

## 2015-01-15 NOTE — ED Notes (Signed)
Reported to EDP Dr. Tyrone Nine that pt has 5% right side pneumothorax.

## 2015-01-15 NOTE — ED Notes (Signed)
Admitting MD at bedside.

## 2015-01-15 NOTE — Progress Notes (Signed)
ANTICOAGULATION CONSULT NOTE - Initial Consult  Pharmacy Consult for Heparin Indication: atrial fibrillation  Allergies  Allergen Reactions  . Latex Rash  . Sulfa Antibiotics Other (See Comments)    Unknown allergic reaction  . Penicillins Rash    Patient Measurements: Height: 5\' 6"  (167.6 cm) Weight: 203 lb (92.08 kg) IBW/kg (Calculated) : 59.3 Heparin Dosing Weight: 80 kg  Vital Signs: Temp: 98 F (36.7 C) (10/07 1532) Temp Source: Axillary (10/07 1532) BP: 113/64 mmHg (10/07 1515) Pulse Rate: 80 (10/07 1515)  Labs:  Recent Labs  01/15/15 1348  HGB 13.2  HCT 40.1  PLT 233  CREATININE 1.56*    Estimated Creatinine Clearance: 36.2 mL/min (by C-G formula based on Cr of 1.56).   Medical History: Past Medical History  Diagnosis Date  . Diabetes mellitus   . Obesity   . Diabetes type 2, uncontrolled ([place])   . Hypoglycemia associated with diabetes ([place])   . Edema of both legs   . Hypertension   . Combined hyperlipidemia   . Acquired autoimmune hypothyroidism   . Thyroiditis, autoimmune   . Abnormal liver function tests   . Sleep apnea, obstructive   . History of gastroesophageal reflux (GERD)   . Depression   . DM neuropathy with neurologic complication ([place])   . COPD with acute exacerbation ([place])   . Goiter   . Fatigue   . Vertigo   . Type II diabetes mellitus with peripheral angiopathy ([place])   . Gout   . Pallor   . Meningioma ([place])   . Seizure ([place])   . Fracture     Left wrist  . Cancer (HCC)     Medications:   (Not in a hospital admission) Scheduled:  Infusions:   Assessment: 74yo female with history of DM2, HTN, HLD, hypothyroid, GERD, COPD and meningioma presents with CP and SOB. Pharmacy is consulted to dose heparin for atrial fibrillation. CBC wnl, Trop neg x1, BNP 480, sCr 1.56.  Goal of Therapy:  Heparin level 0.3-0.7 units/ml Monitor platelets by anticoagulation protocol: Yes   Plan:  Give 4000 units bolus x 1 Start heparin  infusion at 1200 units/hr Check anti-Xa level in 8 hours and daily while on heparin Continue to monitor H&H and platelets  Andrey Cota. Diona Foley, PharmD Clinical Pharmacist Pager [phone] 01/15/2015,3:42 PM

## 2015-01-15 NOTE — H&P (Signed)
Referring Physician:  [name] is an 74 y.o. female.                       Chief Complaint: Shortness of breath  HPI: 74 yo F with PMH of DM, II, hypertension, dyslipidemia, Meningioma-s/p surgery 04/2014 has chief complaints of chest pain and shortness of breath. This feels like pressure as well as her heart racing. This has been getting worse upon exertion. Patient has no known cardiac history per her. Family felt like her heart rate was getting faster and faster throughout the week. She was evaluated at a fire station and have a heart rate in the 150s. 911 was called. Patient has no history of this in the past. Heart rate improving with diltiazem drip. BNP elevated at 480 but chest x-ray showed clear lungs with atelectasis.  Past Medical History  Diagnosis Date  . Diabetes mellitus   . Obesity   . Diabetes type 2, uncontrolled ([place])   . Hypoglycemia associated with diabetes ([place])   . Edema of both legs   . Hypertension   . Combined hyperlipidemia   . Acquired autoimmune hypothyroidism   . Thyroiditis, autoimmune   . Abnormal liver function tests   . Sleep apnea, obstructive   . History of gastroesophageal reflux (GERD)   . Depression   . 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 COPD with acute exacerbation ([place])   . Goiter   . Fatigue   . Vertigo   . Type II diabetes mellitus with peripheral angiopathy ([place])   . Gout   . Pallor   . Meningioma ([place])   . Seizure ([place])   . Fracture     Left wrist  . Cancer [facility])       Past Surgical History  Procedure Laterality Date  . Laparoscopic gastric banding    . Back surgery    . Craniotomy Right 05/08/2014    Procedure: CRANIOTOMY FOR MENINGIOMA;  Surgeon: Ashok Pall, MD;  Location: [place] NEURO ORS;  Service: Neurosurgery;  Laterality: Right;  Right Craniotomy for meningioma  . Esophagogastroduodenoscopy (egd) with propofol N/A 05/22/2014    Procedure: ESOPHAGOGASTRODUODENOSCOPY (EGD) WITH PROPOFOL;  Surgeon: Wonda Horner, MD;  Location: [facility] ENDOSCOPY;  Service: Endoscopy;  Laterality: N/A;  . Brain meningioma excision      Family History  Problem Relation Age of Onset  . Diabetes Father   . Diabetes Sister   . Diabetes Brother   . Melanoma Mother    Social History:  reports that she has never smoked. She does not have any smokeless tobacco history on file. She reports that she does not drink alcohol or use illicit drugs.  Allergies:  Allergies  Allergen Reactions  . Latex Rash  . Sulfa Antibiotics Other (See Comments)    Unknown allergic reaction  . Penicillins Rash     (Not in a hospital admission)  Results for orders placed or performed during the hospital encounter of 01/15/15 (from the past 48 hour(s))  CBC with Differential     Status: None   Collection Time: 01/15/15  1:48 PM  Result Value Ref Range   WBC 10.4 4.0 - 10.5 K/uL   RBC 4.39 3.87 - 5.11 MIL/uL   Hemoglobin 13.2 12.0 - 15.0 g/dL   HCT 40.1 36.0 - 46.0 %   MCV 91.3 78.0 - 100.0 fL   MCH 30.1 26.0 - 34.0 pg   MCHC 32.9 30.0 - 36.0 g/dL  RDW 13.5 11.5 - 15.5 %   Platelets 233 150 - 400 K/uL   Neutrophils Relative % 64 %   Neutro Abs 6.7 1.7 - 7.7 K/uL   Lymphocytes Relative 31 %   Lymphs Abs 3.3 0.7 - 4.0 K/uL   Monocytes Relative 4 %   Monocytes Absolute 0.4 0.1 - 1.0 K/uL   Eosinophils Relative 1 %   Eosinophils Absolute 0.1 0.0 - 0.7 K/uL   Basophils Relative 0 %   Basophils Absolute 0.0 0.0 - 0.1 K/uL  Magnesium     Status: None   Collection Time: 01/15/15  1:48 PM  Result Value Ref Range   Magnesium 1.7 1.7 - 2.4 mg/dL  Brain natriuretic peptide     Status: Abnormal   Collection Time: 01/15/15  1:48 PM  Result Value Ref Range   B Natriuretic Peptide 480.0 (H) 0.0 - 100.0 pg/mL  Basic metabolic panel     Status: Abnormal   Collection Time: 01/15/15  1:48 PM  Result Value Ref Range   Sodium 140 135 - 145 mmol/L   Potassium 3.7 3.5 - 5.1 mmol/L   Chloride 103 101 - 111 mmol/L   CO2 26 22 - 32  mmol/L   Glucose, Bld 300 (H) 65 - 99 mg/dL   BUN 40 (H) 6 - 20 mg/dL   Creatinine, Ser 1.56 (H) 0.44 - 1.00 mg/dL   Calcium 8.5 (L) 8.9 - 10.3 mg/dL   GFR calc non Af Amer 32 (L) >60 mL/min   GFR calc Af Amer 37 (L) >60 mL/min    Comment: (NOTE) The eGFR has been calculated using the CKD EPI equation. This calculation has not been validated in all clinical situations. eGFR's persistently <60 mL/min signify possible Chronic Kidney Disease.    Anion gap 11 5 - 15  I-stat troponin, ED     Status: None   Collection Time: 01/15/15  1:56 PM  Result Value Ref Range   Troponin i, poc 0.00 0.00 - 0.08 ng/mL   Comment 3            Comment: Due to the release kinetics of cTnI, a negative result within the first hours of the onset of symptoms does not rule out myocardial infarction with certainty. If myocardial infarction is still suspected, repeat the test at appropriate intervals.    Dg Chest Port 1 View  01/15/2015   ADDENDUM REPORT: 01/15/2015 15:12 ADDENDUM: Because of technical factors, images from a different patient were included under the patient named Morgan [name]. Only the initial image which includes anterior plate and screws in the lower cervical spine corresponds to the actual patient. The subsequent images are incorrectly labeled under this patient. The following report is the corrected report: CLINICAL DATA: Short of breath EXAM: PORTABLE CHEST 1 VIEW COMPARISON: 05/11/2014 FINDINGS: On the initial image, there are low volumes. Bibasilar atelectasis. Normal heart size. Upper lungs clear. Subsequent images belong to a different patient. IMPRESSION: Bibasilar atelectasis. Electronically Signed   By: Marybelle Killings M.D.   On: 01/15/2015 15:12  01/15/2015   CLINICAL DATA:  Short of breath  EXAM: PORTABLE CHEST 1 VIEW  COMPARISON:  05/11/2014  FINDINGS: On the initial image, there are low volumes. Bibasilar atelectasis. Normal heart size. Upper lungs clear.  On the second image, a right  internal jugular central venous catheter has been placed. Tip is in the right atrium. A 5% right pneumothorax has developed.  IMPRESSION: Right internal jugular central venous catheter placement with its tip  in the right atrium. 5% right apical pneumothorax has developed.  Electronically Signed: By: Marybelle Killings M.D. On: 01/15/2015 14:26    Review Of Systems Constitutional: Negative for fever and chills.  HENT: Negative for congestion and rhinorrhea.  Eyes: Negative for redness and visual disturbance.  Respiratory: Positive for shortness of breath. Negative for wheezing.  Cardiovascular: Positive for chest pain. Negative for palpitations.  Gastrointestinal: Negative for nausea and vomiting.  Genitourinary: Negative for dysuria and urgency.  Musculoskeletal: Negative for myalgias and arthralgias.  Skin: Negative for pallor and wound.  Neurological: Negative for dizziness and headaches.   Blood pressure 164/79, pulse 45, temperature 98 F (36.7 C), temperature source Axillary, resp. rate 15, height 5' 6"  (1.676 m), weight 92.08 kg (203 lb), SpO2 95 %.  Physical Exam  Constitutional: She appears well-developed and well-nourished. No distress.  HENT: Head: Normocephalic and skull depression on right temporal area.  Eyes: EOM are normal. Pupils are equal, round, and reactive to light.  Neck: Normal range of motion. Neck supple.  Cardiovascular: Normal rate and regular rhythm. Exam reveals no gallop and no friction rub.II/VI systolic murmur. Pulmonary/Chest: Effort normal. She has no wheezes. She has no rales.  Abdominal: Soft. She exhibits no distension. There is no tenderness. There is no rebound.  Musculoskeletal: She exhibits no edema or tenderness.  Neurological: She is alert and oriented to person, place, and time.  Skin: Skin is warm and dry. Rash (significant erythema under the skin folds with satellite lesions.) noted. She is not diaphoretic.  Psychiatric: She has a normal mood  and affect. Her behavior is normal.   Assessment/Plan Acute left heart systolic failure DM, II-uncontrolled Chest pain r/o CAD/MI Atrial flutter with [id number] score of 5/9 Hypertension Obesity Depression  Admit IV diltiazem/Home medications Echocardiogram Nuclear stress test post stabilization  Toshiko Kemler S, MD  01/15/2015, 4:55 PM

## 2015-01-15 NOTE — ED Provider Notes (Signed)
CSN: [id number]     Arrival date & time 01/15/15  1326 History   First MD Initiated Contact with Patient 01/15/15 1327     Chief Complaint  Patient presents with  . Chest Pain  . Shortness of Breath     (Consider location/radiation/quality/duration/timing/severity/associated sxs/prior Treatment) Patient is a 74 y.o. female presenting with chest pain and shortness of breath. The history is provided by the patient.  Chest Pain Pain location:  Substernal area Pain quality: pressure   Pain radiates to:  Does not radiate Pain radiates to the back: no   Pain severity:  Moderate Onset quality:  Sudden Duration:  2 days Timing:  Constant Progression:  Worsening Associated symptoms: shortness of breath   Associated symptoms: no dizziness, no fever, no headache, no nausea, no palpitations and not vomiting   Risk factors: diabetes mellitus, high cholesterol, hypertension and obesity   Risk factors: no Ehlers-Danlos syndrome   Shortness of Breath Associated symptoms: chest pain   Associated symptoms: no fever, no headaches, no vomiting and no wheezing    74 yo F with a chief complaint of chest pain shortness of breath. This feels like pressure as well as her heart racing. This been getting worse upon exertion. Patient has no known cardiac history per her. Family felt like her heart rate was getting faster and faster throughout the week. She was evaluated at a fire station and have a heart rate in the 150s. 911 was called. Patient has no history of this in the past.  Past Medical History  Diagnosis Date  . Diabetes mellitus   . Obesity   . Diabetes type 2, uncontrolled ([place])   . Hypoglycemia associated with diabetes ([place])   . Edema of both legs   . Hypertension   . Combined hyperlipidemia   . Acquired autoimmune hypothyroidism   . Thyroiditis, autoimmune   . Abnormal liver function tests   . Sleep apnea, obstructive   . History of gastroesophageal reflux (GERD)   . Depression   . DM  neuropathy with neurologic complication ([place])   . COPD with acute exacerbation ([place])   . Goiter   . Fatigue   . Vertigo   . Type II diabetes mellitus with peripheral angiopathy ([place])   . Gout   . Pallor   . Meningioma ([place])   . Seizure ([place])   . Fracture     Left wrist  . Cancer [facility])    Past Surgical History  Procedure Laterality Date  . Laparoscopic gastric banding    . Back surgery    . Craniotomy Right 05/08/2014    Procedure: CRANIOTOMY FOR MENINGIOMA;  Surgeon: Ashok Pall, MD;  Location: [place] NEURO ORS;  Service: Neurosurgery;  Laterality: Right;  Right Craniotomy for meningioma  . Esophagogastroduodenoscopy (egd) with propofol N/A 05/22/2014    Procedure: ESOPHAGOGASTRODUODENOSCOPY (EGD) WITH PROPOFOL;  Surgeon: Wonda Horner, MD;  Location: [facility] ENDOSCOPY;  Service: Endoscopy;  Laterality: N/A;  . Brain meningioma excision     Family History  Problem Relation Age of Onset  . Diabetes Father   . Diabetes Sister   . Diabetes Brother   . Melanoma Mother    Social History  Substance Use Topics  . Smoking status: Never Smoker   . Smokeless tobacco: None  . Alcohol Use: No   OB History    No data available     Review of Systems  Constitutional: Negative for fever and chills.  HENT: Negative for congestion and rhinorrhea.  Eyes: Negative for redness and visual disturbance.  Respiratory: Positive for shortness of breath. Negative for wheezing.   Cardiovascular: Positive for chest pain. Negative for palpitations.  Gastrointestinal: Negative for nausea and vomiting.  Genitourinary: Negative for dysuria and urgency.  Musculoskeletal: Negative for myalgias and arthralgias.  Skin: Negative for pallor and wound.  Neurological: Negative for dizziness and headaches.      Allergies  Latex; Sulfa antibiotics; and Penicillins  Home Medications   Prior to Admission medications   Medication Sig Start Date End Date Taking? Authorizing Provider  ACCU-CHEK SMARTVIEW test  strip USE TO TEST BLOOD SUGAR 6 TO 8 TIMES A DAY 09/30/12   Sherrlyn Hock, MD  acetaminophen (TYLENOL) 325 MG tablet Take 650 mg by mouth every 6 (six) hours as needed.    Historical Provider, MD  ALPRAZolam Duanne Moron) 0.5 MG tablet Take 0.5 mg by mouth 3 (three) times daily as needed for anxiety.    Historical Provider, MD  anastrozole (ARIMIDEX) 1 MG tablet Take 1 mg by mouth daily.      Historical Provider, MD  ARIPiprazole (ABILIFY) 2 MG tablet Take 2 mg by mouth daily.    Historical Provider, MD  cholecalciferol (VITAMIN D) 1000 UNITS tablet Take 1,000 Units by mouth daily.    Historical Provider, MD  cloNIDine (CATAPRES) 0.3 MG tablet Take 0.3 mg by mouth 2 (two) times daily.      Historical Provider, MD  colchicine (COLCRYS) 0.6 MG tablet Take 0.6 mg by mouth 2 (two) times daily as needed (gout).     Historical Provider, MD  DULoxetine (CYMBALTA) 60 MG capsule Take 60 mg by mouth 2 (two) times daily.     Historical Provider, MD  furosemide (LASIX) 40 MG tablet Take 40 mg by mouth 2 (two) times daily.      Historical Provider, MD  HYDROcodone-acetaminophen (NORCO/VICODIN) 5-325 MG per tablet Take 1 tablet by mouth every 6 (six) hours as needed for moderate pain.    Historical Provider, MD  hydroxypropyl methylcellulose / hypromellose (ISOPTO TEARS / GONIOVISC) 2.5 % ophthalmic solution 1 drop as needed for dry eyes.    Historical Provider, MD  hydrOXYzine (ATARAX/VISTARIL) 25 MG tablet Take 25 mg by mouth 3 (three) times daily as needed.    Historical Provider, MD  insulin NPH Human (HUMULIN N,NOVOLIN N) 100 UNIT/ML injection Inject into the skin.    Historical Provider, MD  levETIRAcetam (KEPPRA) 500 MG tablet One tabs po am,  2tabs po qhs 01/13/15   Marcial Pacas, MD  levothyroxine (SYNTHROID, LEVOTHROID) 200 MCG tablet Take 200-300 mcg by mouth. Brand Name Synthroid Only. Take 1 1/2 tablets (300 mg) on Sundays, take 1 tablet (200 mg) Monday thru Saturday 12/14/11   Sherrlyn Hock, MD   Liraglutide 18 MG/3ML SOPN Inject 1.8 mg into the skin daily. Victoza    Historical Provider, MD  metoprolol (LOPRESSOR) 50 MG tablet Take 50 mg by mouth 2 (two) times daily.    Historical Provider, MD  mometasone-formoterol (DULERA) 100-5 MCG/ACT AERO Inhale 2 puffs into the lungs 2 (two) times daily.    Historical Provider, MD  nystatin (MYCOSTATIN) powder Apply topically as needed.    Historical Provider, MD  ondansetron (ZOFRAN) 4 MG tablet Take 4 mg by mouth as needed for nausea or vomiting.    Historical Provider, MD  pantoprazole (PROTONIX) 40 MG tablet Take 40 mg by mouth daily.    Historical Provider, MD  polyethylene glycol (MIRALAX / GLYCOLAX) packet Take 17 g by  mouth daily.    Historical Provider, MD  senna (SENOKOT) 8.6 MG TABS tablet Take 1 tablet by mouth daily as needed for mild constipation.    Historical Provider, MD  zaleplon (SONATA) 10 MG capsule Take 10 mg by mouth at bedtime as needed for sleep.    Historical Provider, MD   BP 113/64 mmHg  Pulse 80  Temp(Src) 98 F (36.7 C) (Axillary)  Resp 11  Ht 5\' 6"  (1.676 m)  Wt 203 lb (92.08 kg)  BMI 32.78 kg/m2  SpO2 98% Physical Exam  Constitutional: She is oriented to person, place, and time. She appears well-developed and well-nourished. No distress.  HENT:  Head: Normocephalic and atraumatic.  Eyes: EOM are normal. Pupils are equal, round, and reactive to light.  Neck: Normal range of motion. Neck supple.  Cardiovascular: Normal rate and regular rhythm.  Exam reveals no gallop and no friction rub.   No murmur heard. Pulmonary/Chest: Effort normal. She has no wheezes. She has no rales.  Abdominal: Soft. She exhibits no distension. There is no tenderness. There is no rebound.  Musculoskeletal: She exhibits no edema or tenderness.  Neurological: She is alert and oriented to person, place, and time.  Skin: Skin is warm and dry. Rash (significant erythema under the skin folds with satellite lesions.) noted. She is not  diaphoretic.  Psychiatric: She has a normal mood and affect. Her behavior is normal.    ED Course  Procedures (including critical care time) Labs Review Labs Reviewed  BRAIN NATRIURETIC PEPTIDE - Abnormal; Notable for the following:    B Natriuretic Peptide 480.0 (*)    All other components within normal limits  BASIC METABOLIC PANEL - Abnormal; Notable for the following:    Glucose, Bld 300 (*)    BUN 40 (*)    Creatinine, Ser 1.56 (*)    Calcium 8.5 (*)    GFR calc non Af Amer 32 (*)    GFR calc Af Amer 37 (*)    All other components within normal limits  CBC WITH DIFFERENTIAL/PLATELET  MAGNESIUM  URINALYSIS, ROUTINE W REFLEX MICROSCOPIC (NOT AT [facility])  Randolm Idol, ED    Imaging Review Dg Chest Port 1 View  01/15/2015   ADDENDUM REPORT: 01/15/2015 15:12 ADDENDUM: Because of technical factors, images from a different patient were included under the patient named Rondell Reams. Only the initial image which includes anterior plate and screws in the lower cervical spine corresponds to the actual patient. The subsequent images are incorrectly labeled under this patient. The following report is the corrected report: CLINICAL DATA: Short of breath EXAM: PORTABLE CHEST 1 VIEW COMPARISON: 05/11/2014 FINDINGS: On the initial image, there are low volumes. Bibasilar atelectasis. Normal heart size. Upper lungs clear. Subsequent images belong to a different patient. IMPRESSION: Bibasilar atelectasis. Electronically Signed   By: Marybelle Killings M.D.   On: 01/15/2015 15:12  01/15/2015   CLINICAL DATA:  Short of breath  EXAM: PORTABLE CHEST 1 VIEW  COMPARISON:  05/11/2014  FINDINGS: On the initial image, there are low volumes. Bibasilar atelectasis. Normal heart size. Upper lungs clear.  On the second image, a right internal jugular central venous catheter has been placed. Tip is in the right atrium. A 5% right pneumothorax has developed.  IMPRESSION: Right internal jugular central venous catheter  placement with its tip in the right atrium. 5% right apical pneumothorax has developed.  Electronically Signed: By: Marybelle Killings M.D. On: 01/15/2015 14:26   I have personally reviewed and evaluated these  images and lab results as part of my medical decision-making.   EKG Interpretation   Date/Time:  Friday January 15 2015 13:41:11 EDT Ventricular Rate:  78 PR Interval:    QRS Duration: 80 QT Interval:  507 QTC Calculation: 578 R Axis:   -14 Text Interpretation:  Atrial flutter with predominant 4:1 AV block  Borderline ST depression, diffuse leads Prolonged QT interval Confirmed by  Kristell Wooding MD, DANIEL [id number]) on 01/15/2015 1:46:07 PM      MDM   Final diagnoses:  Atrial flutter by electrocardiogram [facility])    74 yo F with a chief complaint of chest pain shortness of breath. This been ongoing for about a week or so. Patient found to be in atrial flutter by EMS. Transferred here started on Cardizem drip with rate control. Patient continuing to have mild shortness of breath. Troponin negative. BNP mildly elevated. Chest x-ray initially sent with the wrong patient information. Upon reevaluation radiology with a unremarkable chest x-ray. No signs of heart failure.  Discussed case with Dr. Doylene Canard, cards, will admit. Start on heparin, tte in the ED.   Significant candidal infection. May be inciting event.  The patients results and plan were reviewed and discussed.   Any x-rays performed were independently reviewed by myself.   Differential diagnosis were considered with the presenting HPI.  Medications  diltiazem (CARDIZEM) 100 mg in dextrose 5 % 100 mL (1 mg/mL) infusion (5 mg/hr Intravenous New Bag/Given 01/15/15 1348)    Filed Vitals:   01/15/15 1430 01/15/15 1445 01/15/15 1515 01/15/15 1532  BP: 110/61 112/64 113/64   Pulse: 80 80 80   Temp:    98 F (36.7 C)  TempSrc:    Axillary  Resp: 19 23 11    Height:      Weight:      SpO2: 98% 99% 98%     Final diagnoses:  Atrial  flutter by electrocardiogram [facility])    Admission/ observation were discussed with the admitting physician, patient and/or family and they are comfortable with the plan.    Deno Etienne, DO 01/15/15 1542

## 2015-01-16 ENCOUNTER — Inpatient Hospital Stay (HOSPITAL_COMMUNITY): Payer: Medicare Other

## 2015-01-16 LAB — BASIC METABOLIC PANEL
ANION GAP: 10 (ref 5–15)
BUN: 42 mg/dL — ABNORMAL HIGH (ref 6–20)
CALCIUM: 8.1 mg/dL — AB (ref 8.9–10.3)
CHLORIDE: 100 mmol/L — AB (ref 101–111)
CO2: 25 mmol/L (ref 22–32)
Creatinine, Ser: 1.56 mg/dL — ABNORMAL HIGH (ref 0.44–1.00)
GFR calc non Af Amer: 32 mL/min — ABNORMAL LOW (ref >60)
GFR, EST AFRICAN AMERICAN: 37 mL/min — AB (ref >60)
Glucose, Bld: 305 mg/dL — ABNORMAL HIGH (ref 65–99)
Potassium: 4.2 mmol/L (ref 3.5–5.1)
Sodium: 135 mmol/L (ref 135–145)

## 2015-01-16 LAB — CBC
HCT: 34.9 % — ABNORMAL LOW (ref 36.0–46.0)
HEMOGLOBIN: 11.8 g/dL — AB (ref 12.0–15.0)
MCH: 30.6 pg (ref 26.0–34.0)
MCHC: 33.8 g/dL (ref 30.0–36.0)
MCV: 90.6 fL (ref 78.0–100.0)
Platelets: 197 10*3/uL (ref 150–400)
RBC: 3.85 MIL/uL — AB (ref 3.87–5.11)
RDW: 13.6 % (ref 11.5–15.5)
WBC: 9.3 10*3/uL (ref 4.0–10.5)

## 2015-01-16 LAB — GLUCOSE, CAPILLARY
GLUCOSE-CAPILLARY: 257 mg/dL — AB (ref 65–99)
Glucose-Capillary: 173 mg/dL — ABNORMAL HIGH (ref 65–99)
Glucose-Capillary: 181 mg/dL — ABNORMAL HIGH (ref 65–99)
Glucose-Capillary: 139 mg/dL — ABNORMAL HIGH (ref 65–99)

## 2015-01-16 LAB — HEMOGLOBIN A1C
HEMOGLOBIN A1C: 7.8 % — AB (ref 4.8–5.6)
Mean Plasma Glucose: 177 mg/dL

## 2015-01-16 LAB — HEPARIN LEVEL (UNFRACTIONATED)
HEPARIN UNFRACTIONATED: 0.53 [IU]/mL (ref 0.30–0.70)
Heparin Unfractionated: 0.75 IU/mL — ABNORMAL HIGH (ref 0.30–0.70)
Heparin Unfractionated: 0.9 [IU]/mL — ABNORMAL HIGH (ref 0.30–0.70)

## 2015-01-16 LAB — TROPONIN I

## 2015-01-16 MED ORDER — DILTIAZEM HCL 100 MG IV SOLR
5.0000 mg/h | INTRAVENOUS | Status: DC
  Administered 2015-01-16: 5 mg/h via INTRAVENOUS
  Filled 2015-01-16: qty 100

## 2015-01-16 MED ORDER — INSULIN NPH (HUMAN) (ISOPHANE) 100 UNIT/ML ~~LOC~~ SUSP
20.0000 [IU] | Freq: Every day | SUBCUTANEOUS | Status: DC
  Administered 2015-01-17: 20 [IU] via SUBCUTANEOUS
  Filled 2015-01-16: qty 10

## 2015-01-16 MED ORDER — AMIODARONE HCL 200 MG PO TABS
200.0000 mg | ORAL_TABLET | Freq: Every day | ORAL | Status: DC
  Administered 2015-01-16 – 2015-01-18 (×3): 200 mg via ORAL
  Filled 2015-01-16 (×3): qty 1

## 2015-01-16 MED ORDER — INSULIN NPH (HUMAN) (ISOPHANE) 100 UNIT/ML ~~LOC~~ SUSP
10.0000 [IU] | Freq: Once | SUBCUTANEOUS | Status: AC
  Administered 2015-01-16: 10 [IU] via SUBCUTANEOUS
  Filled 2015-01-16: qty 10

## 2015-01-16 NOTE — Progress Notes (Signed)
Spoke with Dr. Terrence Dupont about patient remaining on Cardizem drip at 45ml/hr, also receiving Amiodarone PO and Lopresser BID. HR 60s Sinus Arrhythmia. Received order to d/c Cardizem gtt and to changed 20 of Lantus to 20 NPH, patients home does but given 10 of NPH this evening as patient will be NPO.

## 2015-01-16 NOTE — Progress Notes (Signed)
ANTICOAGULATION CONSULT NOTE - Follow Up Consult  Pharmacy Consult for heparin Indication: atrial fibrillation   Labs:  Recent Labs  01/15/15 1348 01/15/15 1900 01/15/15 2249 01/16/15 0016  HGB 13.2 13.1  --   --   HCT 40.1 39.7  --   --   PLT 233 229  --   --   HEPARINUNFRC  --   --   --  0.75*  CREATININE 1.56*  --   --   --   TROPONINI  --  <0.03 0.03  --      Assessment: 74yo female slightly supratherapeutic on heparin with initial dosing for Afib/flutter.  Goal of Therapy:  Heparin level 0.3-0.7 units/ml   Plan:  Will decrease heparin gtt slightly to 1100 units/hr and check level in [place], PharmD, BCPS  01/16/2015,12:51 AM

## 2015-01-16 NOTE — Progress Notes (Signed)
Utilization review completed.  

## 2015-01-16 NOTE — Progress Notes (Signed)
Echocardiogram 2D Echocardiogram has been performed.  Morgan [name] 01/16/2015, 11:11 AM

## 2015-01-16 NOTE — Progress Notes (Signed)
Subjective:  Complains of vague right-sided chest pain without associated symptoms. No further episodes of palpitation spontaneously converted back into sinus rhythm  Objective:  Vital Signs in the last 24 hours: Temp:  [97.5 F (36.4 C)-98 F (36.7 C)] 97.9 F (36.6 C) (10/08 0529) Pulse Rate:  [33-104] 57 (10/08 0709) Resp:  [11-29] 17 (10/08 1011) BP: (105-173)/(53-93) 169/62 mmHg (10/08 1011) SpO2:  [95 %-100 %] 98 % (10/08 0709) Weight:  [91.2 kg (201 lb 1 oz)-92.5 kg (203 lb 14.8 oz)] 92.5 kg (203 lb 14.8 oz) (10/08 0529)  Intake/Output from previous day: 10/07 0701 - 10/08 0700 In: 363 [P.O.:200; I.V.:163] Out: 1300 [Urine:1300] Intake/Output from this shift: Total I/O In: -  Out: 900 [Urine:900]  Physical Exam: Neck: no adenopathy, no carotid bruit, no JVD and supple, symmetrical, trachea midline Lungs: clear to auscultation bilaterally Heart: regular rate and rhythm, S1, S2 normal and Soft systolic murmur noted Abdomen: soft, non-tender; bowel sounds normal; no masses,  no organomegaly Extremities: extremities normal, atraumatic, no cyanosis or edema  Lab Results:  Recent Labs  01/15/15 1900 01/16/15 0440  WBC 11.1* 9.3  HGB 13.1 11.8*  PLT 229 197    Recent Labs  01/15/15 1348 01/16/15 0440  NA 140 135  K 3.7 4.2  CL 103 100*  CO2 26 25  GLUCOSE 300* 305*  BUN 40* 42*  CREATININE 1.56* 1.56*    Recent Labs  01/15/15 2249 01/16/15 0440  TROPONINI 0.03 <0.03   Hepatic Function Panel No results for input(s): PROT, ALBUMIN, AST, ALT, ALKPHOS, BILITOT, BILIDIR, IBILI in the last 72 hours. No results for input(s): CHOL in the last 72 hours. No results for input(s): PROTIME in the last 72 hours.  Imaging: Imaging results have been reviewed and Dg Chest 2 View  01/16/2015   CLINICAL DATA:  CHF  EXAM: CHEST  2 VIEW  COMPARISON:  Chest radiograph from one day prior.  FINDINGS: Surgical hardware overlies the lower cervical spine. Surgical clips  overlie the lateral lower left chest wall. Stable cardiomediastinal silhouette with mild cardiomegaly. No pneumothorax. No pleural effusion. Stable minimal scarring versus atelectasis of the lateral left lung base. No overt pulmonary edema. No focal lung consolidation.  IMPRESSION: 1. Stable mild cardiomegaly without overt pulmonary edema. 2. Stable minimal scarring versus atelectasis at the lateral left lung base. Otherwise no active disease in the chest.   Electronically Signed   By: Ilona Sorrel M.D.   On: 01/16/2015 08:52   Dg Chest Port 1 View  01/15/2015   ADDENDUM REPORT: 01/15/2015 15:12 ADDENDUM: Because of technical factors, images from a different patient were included under the patient named Rondell Reams. Only the initial image which includes anterior plate and screws in the lower cervical spine corresponds to the actual patient. The subsequent images are incorrectly labeled under this patient. The following report is the corrected report: CLINICAL DATA: Short of breath EXAM: PORTABLE CHEST 1 VIEW COMPARISON: 05/11/2014 FINDINGS: On the initial image, there are low volumes. Bibasilar atelectasis. Normal heart size. Upper lungs clear. Subsequent images belong to a different patient. IMPRESSION: Bibasilar atelectasis. Electronically Signed   By: Marybelle Killings M.D.   On: 01/15/2015 15:12  01/15/2015   CLINICAL DATA:  Short of breath  EXAM: PORTABLE CHEST 1 VIEW  COMPARISON:  05/11/2014  FINDINGS: On the initial image, there are low volumes. Bibasilar atelectasis. Normal heart size. Upper lungs clear.  On the second image, a right internal jugular central venous catheter has been placed. Tip  is in the right atrium. A 5% right pneumothorax has developed.  IMPRESSION: Right internal jugular central venous catheter placement with its tip in the right atrium. 5% right apical pneumothorax has developed.  Electronically Signed: By: Marybelle Killings M.D. On: 01/15/2015 14:26    Cardiac  Studies:  Assessment/Plan:  Status post a flutter with RVR chads vasc score of 5 Atypical chest pain MI ruled out Hypertension Diabetes mellitus Morbid obesity Depression Acute congestive heart failure secondary to preserve LV systolic function History of meningioma Plan Continue present management Add low-dose amiodarone Schedule for nuclear stress test asked in a.m. Discussed with patient and family regarding flutter ablation this medical management, opted for medical management for now unless recurrent episodes of atrial flutter.  LOS: 1 day    Charolette Forward 01/16/2015, 1:36 PM

## 2015-01-16 NOTE — Progress Notes (Signed)
Patient takes 20 units of NPH at bedtime and Lantus is ordered. Spoke with Dr. Candyce Churn about this and would like patient to receive Lantus tonight and will discuss changing with morning team. Patient agreed to take Lantus and HS snack given.

## 2015-01-16 NOTE — Progress Notes (Signed)
ANTICOAGULATION CONSULT NOTE - Follow Up Consult  Pharmacy Consult for heparin Indication: atrial fibrillation/flutter   Labs:  Recent Labs  01/15/15 1348 01/15/15 1900 01/15/15 2249 01/16/15 0016 01/16/15 0440 01/16/15 0900 01/16/15 1855  HGB 13.2 13.1  --   --  11.8*  --   --   HCT 40.1 39.7  --   --  34.9*  --   --   PLT 233 229  --   --  197  --   --   HEPARINUNFRC  --   --   --  0.75*  --  0.90* 0.53  CREATININE 1.56*  --   --   --  1.56*  --   --   TROPONINI  --  <0.03 0.03  --  <0.03  --   --      Assessment: 74 yo female on heparin for Afib/flutter. Heparin is currently on 900 units/hr.  HL now = 0.53  Hgb 11.8, Plt 197, no reports of bleeding Dosing weight 79.5 kg  Goal of Therapy:  Heparin level 0.3-0.7 units/ml   Plan:  - Cont heparin to 900 units/hr (11 units/kg/hr) - Daily HL and CBC - F/U plans for long term [facility], PharmD Pager: [phone] 01/16/2015 7:35 PM

## 2015-01-16 NOTE — Progress Notes (Signed)
ANTICOAGULATION CONSULT NOTE - Follow Up Consult  Pharmacy Consult for heparin Indication: atrial fibrillation/flutter   Labs:  Recent Labs  01/15/15 1348 01/15/15 1900 01/15/15 2249 01/16/15 0016 01/16/15 0440  HGB 13.2 13.1  --   --  11.8*  HCT 40.1 39.7  --   --  34.9*  PLT 233 229  --   --  197  HEPARINUNFRC  --   --   --  0.75*  --   CREATININE 1.56*  --   --   --  1.56*  TROPONINI  --  <0.03 0.03  --  <0.03     Assessment: 74 yo female on heparin for Afib/flutter. Was on 1200 units/hr and had HL = 0.75. Dose reduced to 1100 units/hr, HL now = 0.90  Hgb 11.8, Plt 197, no reports of bleeding Dosing weight 79.5 kg  Goal of Therapy:  Heparin level 0.3-0.7 units/ml   Plan:  - Decrease heparin to 900 units/hr (11 units/kg/hr) - Recheck HL in 8 hr (1900) - Daily HL and CBC - F/U plans for long term [place], PharmD Clinical Pharmacy Resident Pager: [phone]  01/16/2015,8:40 AM

## 2015-01-17 ENCOUNTER — Other Ambulatory Visit (HOSPITAL_COMMUNITY): Payer: Medicare Other

## 2015-01-17 ENCOUNTER — Inpatient Hospital Stay (HOSPITAL_COMMUNITY): Payer: Medicare Other

## 2015-01-17 LAB — BASIC METABOLIC PANEL
ANION GAP: 8 (ref 5–15)
BUN: 34 mg/dL — ABNORMAL HIGH (ref 6–20)
CALCIUM: 8.5 mg/dL — AB (ref 8.9–10.3)
CHLORIDE: 98 mmol/L — AB (ref 101–111)
CO2: 27 mmol/L (ref 22–32)
Creatinine, Ser: 1.42 mg/dL — ABNORMAL HIGH (ref 0.44–1.00)
GFR calc non Af Amer: 35 mL/min — ABNORMAL LOW (ref >60)
GFR, EST AFRICAN AMERICAN: 41 mL/min — AB (ref >60)
GLUCOSE: 172 mg/dL — AB (ref 65–99)
POTASSIUM: 3.9 mmol/L (ref 3.5–5.1)
Sodium: 133 mmol/L — ABNORMAL LOW (ref 135–145)

## 2015-01-17 LAB — CBC
HEMATOCRIT: 32.8 % — AB (ref 36.0–46.0)
HEMOGLOBIN: 11.1 g/dL — AB (ref 12.0–15.0)
MCH: 31.1 pg (ref 26.0–34.0)
MCHC: 33.8 g/dL (ref 30.0–36.0)
MCV: 91.9 fL (ref 78.0–100.0)
PLATELETS: 155 10*3/uL (ref 150–400)
RBC: 3.57 MIL/uL — AB (ref 3.87–5.11)
RDW: 13.4 % (ref 11.5–15.5)
WBC: 6.8 10*3/uL (ref 4.0–10.5)

## 2015-01-17 LAB — GLUCOSE, CAPILLARY
GLUCOSE-CAPILLARY: 196 mg/dL — AB (ref 65–99)
GLUCOSE-CAPILLARY: 147 mg/dL — AB (ref 65–99)
GLUCOSE-CAPILLARY: 138 mg/dL — AB (ref 65–99)
Glucose-Capillary: 148 mg/dL — ABNORMAL HIGH (ref 65–99)

## 2015-01-17 LAB — TSH: TSH: 6.596 u[IU]/mL — ABNORMAL HIGH (ref 0.350–4.500)

## 2015-01-17 LAB — HEPARIN LEVEL (UNFRACTIONATED): Heparin Unfractionated: 0.57 IU/mL (ref 0.30–0.70)

## 2015-01-17 MED ORDER — REGADENOSON 0.4 MG/5ML IV SOLN
INTRAVENOUS | Status: AC
  Administered 2015-01-17: 0.4 mg
  Filled 2015-01-17: qty 5

## 2015-01-17 MED ORDER — LOSARTAN POTASSIUM 50 MG PO TABS
50.0000 mg | ORAL_TABLET | Freq: Every day | ORAL | Status: DC
  Administered 2015-01-17 – 2015-01-18 (×2): 50 mg via ORAL
  Filled 2015-01-17 (×2): qty 1

## 2015-01-17 MED ORDER — TECHNETIUM TC 99M SESTAMIBI GENERIC - CARDIOLITE
10.0000 | Freq: Once | INTRAVENOUS | Status: AC | PRN
  Administered 2015-01-17: 10 via INTRAVENOUS

## 2015-01-17 MED ORDER — TECHNETIUM TC 99M SESTAMIBI - CARDIOLITE
30.0000 | Freq: Once | INTRAVENOUS | Status: AC | PRN
  Administered 2015-01-17: 10:00:00 30 via INTRAVENOUS

## 2015-01-17 NOTE — Progress Notes (Signed)
Subjective:  Denies any chest pain or shortness of breath no further palpitations. Remains in sinus rhythm . Tolerated stress portion of Lexiscan Myoview  Objective:  Vital Signs in the last 24 hours: Temp:  [97.6 F (36.4 C)-98.3 F (36.8 C)] 97.6 F (36.4 C) (10/09 0441) Pulse Rate:  [57-81] 75 (10/09 1043) Resp:  [12-22] 16 (10/09 1001) BP: (118-183)/(50-73) 183/58 mmHg (10/09 1043) SpO2:  [95 %-100 %] 100 % (10/09 0440) Weight:  [92.6 kg (204 lb 2.3 oz)] 92.6 kg (204 lb 2.3 oz) (10/09 0441)  Intake/Output from previous day: 10/08 0701 - 10/09 0700 In: 100 [P.O.:100] Out: 1800 [Urine:1800] Intake/Output from this shift:    Physical Exam: Neck: no adenopathy, no carotid bruit, no JVD and supple, symmetrical, trachea midline Lungs: clear to auscultation bilaterally Heart: regular rate and rhythm, S1, S2 normal and Soft systolic murmur noted Abdomen: soft, non-tender; bowel sounds normal; no masses,  no organomegaly Extremities: extremities normal, atraumatic, no cyanosis or edema  Lab Results:  Recent Labs  01/16/15 0440 01/17/15 0510  WBC 9.3 6.8  HGB 11.8* 11.1*  PLT 197 155    Recent Labs  01/16/15 0440 01/17/15 0510  NA 135 133*  K 4.2 3.9  CL 100* 98*  CO2 25 27  GLUCOSE 305* 172*  BUN 42* 34*  CREATININE 1.56* 1.42*    Recent Labs  01/15/15 2249 01/16/15 0440  TROPONINI 0.03 <0.03   Hepatic Function Panel No results for input(s): PROT, ALBUMIN, AST, ALT, ALKPHOS, BILITOT, BILIDIR, IBILI in the last 72 hours. No results for input(s): CHOL in the last 72 hours. No results for input(s): PROTIME in the last 72 hours.  Imaging: Imaging results have been reviewed and Dg Chest 2 View  01/16/2015   CLINICAL DATA:  CHF  EXAM: CHEST  2 VIEW  COMPARISON:  Chest radiograph from one day prior.  FINDINGS: Surgical hardware overlies the lower cervical spine. Surgical clips overlie the lateral lower left chest wall. Stable cardiomediastinal silhouette with  mild cardiomegaly. No pneumothorax. No pleural effusion. Stable minimal scarring versus atelectasis of the lateral left lung base. No overt pulmonary edema. No focal lung consolidation.  IMPRESSION: 1. Stable mild cardiomegaly without overt pulmonary edema. 2. Stable minimal scarring versus atelectasis at the lateral left lung base. Otherwise no active disease in the chest.   Electronically Signed   By: Ilona Sorrel M.D.   On: 01/16/2015 08:52   Dg Chest Port 1 View  01/15/2015   ADDENDUM REPORT: 01/15/2015 15:12 ADDENDUM: Because of technical factors, images from a different patient were included under the patient named Rondell Reams. Only the initial image which includes anterior plate and screws in the lower cervical spine corresponds to the actual patient. The subsequent images are incorrectly labeled under this patient. The following report is the corrected report: CLINICAL DATA: Short of breath EXAM: PORTABLE CHEST 1 VIEW COMPARISON: 05/11/2014 FINDINGS: On the initial image, there are low volumes. Bibasilar atelectasis. Normal heart size. Upper lungs clear. Subsequent images belong to a different patient. IMPRESSION: Bibasilar atelectasis. Electronically Signed   By: Marybelle Killings M.D.   On: 01/15/2015 15:12  01/15/2015   CLINICAL DATA:  Short of breath  EXAM: PORTABLE CHEST 1 VIEW  COMPARISON:  05/11/2014  FINDINGS: On the initial image, there are low volumes. Bibasilar atelectasis. Normal heart size. Upper lungs clear.  On the second image, a right internal jugular central venous catheter has been placed. Tip is in the right atrium. A 5% right pneumothorax has  developed.  IMPRESSION: Right internal jugular central venous catheter placement with its tip in the right atrium. 5% right apical pneumothorax has developed.  Electronically Signed: By: Marybelle Killings M.D. On: 01/15/2015 14:26    Cardiac Studies:  Assessment/Plan:  Status post a flutter with RVR chads vasc score of 5 Atypical chest pain MI  ruled out Uncontrolled Hypertension Diabetes mellitus Morbid obesity Depression Acute congestive heart failure secondary to preserve LV systolic function History of meningioma Plan Start losartan  50 mg daily Check nuclear stress test result  LOS: 2 days    Charolette Forward 01/17/2015, 11:20 AM

## 2015-01-17 NOTE — Progress Notes (Signed)
ANTICOAGULATION CONSULT NOTE - Follow Up Consult  Pharmacy Consult for heparin Indication: atrial fibrillation/flutter   Labs:  Recent Labs  01/15/15 1348 01/15/15 1900 01/15/15 2249  01/16/15 0440 01/16/15 0900 01/16/15 1855 01/17/15 0510  HGB 13.2 13.1  --   --  11.8*  --   --  11.1*  HCT 40.1 39.7  --   --  34.9*  --   --  32.8*  PLT 233 229  --   --  197  --   --  155  HEPARINUNFRC  --   --   --   < >  --  0.90* 0.53 0.57  CREATININE 1.56*  --   --   --  1.56*  --   --  1.42*  TROPONINI  --  <0.03 0.03  --  <0.03  --   --   --   < > = values in this interval not displayed.   Assessment: 74 yo female on heparin for Afib/flutter. Heparin is currently at 900 units/hr.  HL remains therapeutic at 0.57  Hgb 11.1, Plt 155, no reports of bleeding Dosing weight 79.5 kg  Goal of Therapy:  Heparin level 0.3-0.7 units/ml   Plan:  - Continue heparin at 900 units/hr (11 units/kg/hr) - Daily HL and CBC - F/U plans for long term [place], PharmD Clinical Pharmacy Resident Pager: [phone] 01/17/2015 9:36 AM

## 2015-01-18 LAB — BASIC METABOLIC PANEL
Anion gap: 7 (ref 5–15)
BUN: 23 mg/dL — ABNORMAL HIGH (ref 6–20)
CALCIUM: 8.8 mg/dL — AB (ref 8.9–10.3)
CO2: 27 mmol/L (ref 22–32)
CREATININE: 1.11 mg/dL — AB (ref 0.44–1.00)
Chloride: 103 mmol/L (ref 101–111)
GFR, EST AFRICAN AMERICAN: 55 mL/min — AB (ref >60)
GFR, EST NON AFRICAN AMERICAN: 48 mL/min — AB (ref >60)
GLUCOSE: 155 mg/dL — AB (ref 65–99)
Potassium: 3.8 mmol/L (ref 3.5–5.1)
Sodium: 137 mmol/L (ref 135–145)

## 2015-01-18 LAB — CBC
HCT: 34.4 % — ABNORMAL LOW (ref 36.0–46.0)
Hemoglobin: 11.1 g/dL — ABNORMAL LOW (ref 12.0–15.0)
MCH: 29.7 pg (ref 26.0–34.0)
MCHC: 32.3 g/dL (ref 30.0–36.0)
MCV: 92 fL (ref 78.0–100.0)
PLATELETS: 156 10*3/uL (ref 150–400)
RBC: 3.74 MIL/uL — ABNORMAL LOW (ref 3.87–5.11)
RDW: 13.6 % (ref 11.5–15.5)
WBC: 7.1 10*3/uL (ref 4.0–10.5)

## 2015-01-18 LAB — GLUCOSE, CAPILLARY
GLUCOSE-CAPILLARY: 135 mg/dL — AB (ref 65–99)
Glucose-Capillary: 171 mg/dL — ABNORMAL HIGH (ref 65–99)

## 2015-01-18 LAB — HEPARIN LEVEL (UNFRACTIONATED): Heparin Unfractionated: 0.37 IU/mL (ref 0.30–0.70)

## 2015-01-18 MED ORDER — LEVOTHYROXINE SODIUM 125 MCG PO TABS
125.0000 ug | ORAL_TABLET | Freq: Every day | ORAL | Status: DC
Start: 2015-01-19 — End: 2015-05-28

## 2015-01-18 MED ORDER — LEVOTHYROXINE SODIUM 25 MCG PO TABS: 125.0000 ug | ORAL_TABLET | Freq: Every day | ORAL | Status: DC

## 2015-01-18 MED ORDER — APIXABAN 2.5 MG PO TABS: 2.5000 mg | ORAL_TABLET | Freq: Two times a day (BID) | ORAL | Status: DC

## 2015-01-18 MED ORDER — APIXABAN 2.5 MG PO TABS
2.5000 mg | ORAL_TABLET | Freq: Two times a day (BID) | ORAL | Status: DC
  Administered 2015-01-18: 2.5 mg via ORAL
  Filled 2015-01-18: qty 1

## 2015-01-18 MED ORDER — AMIODARONE HCL 200 MG PO TABS: 200.0000 mg | ORAL_TABLET | Freq: Every day | ORAL | Status: DC

## 2015-01-18 NOTE — Progress Notes (Signed)
ANTICOAGULATION CONSULT NOTE - Follow Up Consult  Pharmacy Consult for heparin Indication: atrial fibrillation/flutter   Labs:  Recent Labs  01/15/15 1900 01/15/15 2249  01/16/15 0440  01/16/15 1855 01/17/15 0510 01/18/15 0328  HGB 13.1  --   --  11.8*  --   --  11.1* 11.1*  HCT 39.7  --   --  34.9*  --   --  32.8* 34.4*  PLT 229  --   --  197  --   --  155 156  HEPARINUNFRC  --   --   < >  --   < > 0.53 0.57 0.37  CREATININE  --   --   --  1.56*  --   --  1.42* 1.11*  TROPONINI <0.03 0.03  --  <0.03  --   --   --   --   < > = values in this interval not displayed.   Assessment: 74 yo female on heparin for Afib/flutter now s/p stress test. Heparin is currently on 900 units/hr.  HL = 0.37 and at goal.   Goal of Therapy:  Heparin level 0.3-0.7 units/ml   Plan:  - Cont heparin to 900 units/hr - Daily HL and CBC - F/U plans for long term [facility]  Hildred Laser, Pharm D 01/18/2015 8:38 AM

## 2015-01-18 NOTE — Discharge Summary (Signed)
Physician Discharge Summary  Patient ID: Morgan [name] MRN: [id number] DOB/AGE: Dec 22, 1940 74 y.o.  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 This feels like pressure as well as her heart racing. This has been getting worse upon exertion. Patient has no known cardiac history per her. Family felt like her heart rate was getting faster and faster throughout the week. She was evaluated at a fire station and have a heart rate in the 150s. 911 was called. Patient has no history of this in the past. She converted to sinus rhythm with diltiazem, metoprolol and amiodarone. She had unremarkable nuclear stress test. She had good LV systolic function, mild diastolic dysfunction, moderate MR, TR on echocardiogram. She was started on low dose Eliquis. If her ambulation remains stable she may increase Eliquis to  5 mg. twice daily in 1 month.   Consults: cardiology  Significant Diagnostic Studies: labs: Normal CBC and BMEt except elevated sugar. BNP elevated at 480, TSH of 6.596. Hgb A1C of 7.8.  Nuclear medicine: 1. No reversible ischemia or infarction. 2. Normal left ventricular wall motion. 3. Left ventricular ejection fraction 66%. 4. Low-risk stress test findings.  cardiac graphics: ECG: Atrial  flutter with AV block to sinus bradycardia.    Echocardiogram: - Left ventricle: The cavity size was normal. There was mild concentric hypertrophy. Systolic function was normal. Theestimated ejection fraction was in the range of 55% to 60%. Wallmotion was normal; there were no regional wall motion abnormalities. Doppler parameters are consistent with abnormal left ventricular relaxation (grade 1 diastolic dysfunction). - Mitral valve: Calcified annulus. There was moderate regurgitation. - Left atrium: The atrium was moderately dilated. - Right atrium: The atrium was mildly dilated. - Tricuspid valve: There was moderate regurgitation. - Pulmonary arteries: Systolic pressure was mildly increased. PA peak pressure: 33 mm Hg (S).  Treatments: cardiac meds: metoprolol, amiodarone, Clonidine patch, and Eliquis,  Discharge Exam: Blood pressure 145/50, pulse 58, temperature 97.9 F (36.6 C), temperature source Oral, resp. rate 16, height 5' 6"  (1.676 m), weight 91.4 kg (201 lb 8 oz), SpO2 100 %. Physical Exam  Constitutional: She appears well-developed and well-nourished. No distress.  HENT: Head: Normocephalic and skull depression on right temporal area.  Eyes: EOM are normal. Pupils are equal, round, and reactive to light.  Neck: Normal range of motion. Neck supple.  Cardiovascular: Normal rate and regular rhythm. Exam reveals no gallop and no friction rub.II/VI systolic murmur. Pulmonary/Chest: Effort normal. She has no wheezes. She has no rales.  Abdominal: Soft. She exhibits no distension. There is no tenderness. There is no rebound.  Musculoskeletal: She exhibits no edema or tenderness.  Neurological: She is alert and oriented to person, place, and time.  Skin: Skin is warm and dry. Rash (significant erythema under the skin folds with satellite lesions.) noted. She is not diaphoretic.  Psychiatric: She has a normal mood and affect. Her behavior is normal.   Disposition:  03-Skilled Nursing Facility     Medication List    STOP taking these medications        LASIX 40 MG tablet  Generic drug:  furosemide      TAKE these medications        ACCU-CHEK SMARTVIEW test strip  Generic drug:  glucose blood  USE TO TEST BLOOD SUGAR 6 TO 8 TIMES A DAY     acetaminophen 325 MG tablet  Commonly known as:  TYLENOL  Take 650 mg by mouth every 6 (six) hours as needed for mild pain.     ALPRAZolam 0.5 MG tablet  Commonly known as:  XANAX  Take 0.5 mg by mouth 3 (three) times daily as needed for anxiety.     amiodarone 200 MG tablet  Commonly known as:  PACERONE  Take 1 tablet (200 mg total) by mouth daily.     anastrozole 1 MG tablet  Commonly known as:  ARIMIDEX  Take 1 mg by mouth daily.     antiseptic oral rinse Liqd  15 mLs by Mouth Rinse route daily as needed for dry mouth.     apixaban 2.5 MG Tabs tablet  Commonly known as:  ELIQUIS  Take 1 tablet (2.5 mg total) by mouth 2 (two) times daily.     ARIPiprazole 5 MG tablet  Commonly known as:  ABILIFY  Take 5 mg by mouth every morning.     cholecalciferol 1000 UNITS tablet  Commonly known as:  VITAMIN D  Take 1,000 Units by mouth daily.     cloNIDine 0.3 mg/24hr patch  Commonly known as:  CATAPRES - Dosed in mg/24 hr  Place 0.3 mg onto the skin once a week. Apply on Fridays     COLCRYS 0.6 MG tablet  Generic drug:  colchicine  Take 0.6 mg by mouth daily.     DULoxetine 60 MG capsule  Commonly known as:  CYMBALTA  Take 60 mg by mouth 2 (two) times daily.     HYDROcodone-acetaminophen 5-325 MG tablet  Commonly known as:  NORCO/VICODIN  Take 1 tablet by mouth every 6 (six) hours as needed for moderate pain.     insulin NPH Human 100 UNIT/ML injection  Commonly known as:  HUMULIN N,NOVOLIN N  Inject 20 Units into the skin 2 (two) times daily before a meal.     levETIRAcetam 1000 MG tablet  Commonly known as:  KEPPRA  Take 1,000 mg by mouth at bedtime. Take every night per MAR      levETIRAcetam 500 MG tablet  Commonly known as:  KEPPRA  Take 500 mg by mouth every morning. Take every morning per [facility]     levothyroxine 100 MCG tablet  Commonly known as:  SYNTHROID, LEVOTHROID  Take 100 mcg by mouth daily before breakfast.     Liraglutide 18 MG/3ML Sopn  Inject 1.8 mg into the skin daily. Victoza     magnesium hydroxide 400 MG/5ML suspension  Commonly known as:  MILK OF MAGNESIA  Take 30 mLs by mouth daily as needed for mild constipation.     metoprolol 50 MG tablet  Commonly known as:  LOPRESSOR  Take 50 mg by mouth 2 (two) times daily.     mometasone-formoterol 100-5 MCG/ACT Aero  Commonly known as:  DULERA  Inhale 2 puffs into the lungs 2 (two) times daily.     ondansetron 4 MG tablet  Commonly known as:  ZOFRAN  Take 4 mg by mouth as needed for nausea or  vomiting.     pantoprazole 40 MG tablet  Commonly known as:  PROTONIX  Take 40 mg by mouth daily.     senna 8.6 MG Tabs tablet  Commonly known as:  SENOKOT  Take 2 tablets by mouth at bedtime.           Follow-up Information    Follow up with HODGES,FRANCISCO, MD. Schedule an appointment as soon as possible for a visit in 1 month.   Specialty:  Family Medicine      Follow up with [facility] S, MD. Schedule an appointment as soon as possible for a visit in 1 week.   Specialty:  Cardiology   Contact information:   [place] [address] [phone]       Signed: Birdie Riddle 01/18/2015, 3:11 PM

## 2015-01-18 NOTE — Discharge Instructions (Signed)

## 2015-01-18 NOTE — Care Management Note (Addendum)
Case Management Note  Patient Details  Name: Morgan [name] MRN: [id number] Date of Birth: 1940-05-23  Subjective/Objective:       a flutter with RVR              Action/Plan: Home Health. Contacted [facility] in [place] # [phone] fax # [phone], pt active with [facility] RN, PT, OT. Will need resumption of care orders at dc. Will fax orders to [facility] once received.   [employer] benefits check complete- Pt copay will be $47- prior auth not required   Expected Discharge Date:  01/20/2015              Expected Discharge Plan:  [place]  In-House Referral:     Discharge planning Services  CM Consult  Post Acute Care Choice:  Home Health, Resumption of Svcs/PTA Provider Choice offered to:  Patient   HH Arranged:  RN, PT, OT [facility] Agency:  [place]  Status of Service:  Completed, signed off  Medicare Important Message Given:  Yes-second notification given Date Medicare IM Given:    Medicare IM give by:    Date Additional Medicare IM Given:    Additional Medicare Important Message give by:     If discussed at [place] of Stay Meetings, dates discussed:    Additional Comments: Erenest Rasher, RN 01/18/2015, 1:36 PM   01/18/2015 1559 NCM spoke to pt and gave permission to speak to dtr, [name] # [phone]. Pt has RW and 3n1 at home. Provided dtr, [name] Price with information on [facility] to assist pt with getting medications at home. States copay cost for medications are a big expense in the home. Faxed resumption of care orders, dc summary, and facesheet to [employer]. Per dtr request, wanted dc summary faxed to PCP's office, Dr. Bea Graff. Contacted office and pt has appt 01/19/2015 at 9:40 am. Jonnie Finner RN CCM Case Mgmt phone [phone]

## 2015-01-18 NOTE — Care Management Important Message (Signed)
Important Message  Patient Details  Name: Morgan [name] MRN: [id number] Date of Birth: 1940-10-26   Medicare Important Message Given:  Yes-second notification given    Nathen May 01/18/2015, 11:39 AM

## 2015-02-04 ENCOUNTER — Telehealth: Payer: Self-pay | Admitting: Neurology

## 2015-02-04 NOTE — Telephone Encounter (Signed)
It appears this Rx was already sent to [facility] on 10/05 (500mg  in am and 1000mg  at night #90 of 500mg  tabs).  I called the pharmacy.  They stated the patient has numerous refills on file.  Rx was last filled on 10/05, so it is too soon to refill at this time, but patient can get Rx when it's due.  I called patient back.  Got no answer.  Left message relaying the info.

## 2015-02-04 NOTE — Telephone Encounter (Signed)
Pt's dtr called requesting refill for levETIRAcetam (KEPPRA) 1000 MG tablet [facility] [address] T[place] Alert

## 2015-02-23 ENCOUNTER — Telehealth: Payer: Self-pay

## 2015-02-25 ENCOUNTER — Telehealth: Payer: Self-pay | Admitting: Cardiology

## 2015-02-25 NOTE — Telephone Encounter (Signed)
Close encounter 

## 2015-03-02 ENCOUNTER — Telehealth: Payer: Self-pay | Admitting: *Deleted

## 2015-03-02 NOTE — Telephone Encounter (Signed)
Labs received from [facility] (collected on 02/18/15):  CBC w/ Diff: WBC 7.5                       HCT 36.4  CMP: Creatinine 1.40  HGB A1C: 7.5  LIPID PANEL: Cholesterol 255                         LDL 158  TSH: 4.06

## 2015-03-10 ENCOUNTER — Ambulatory Visit: Payer: Medicare Other | Admitting: Cardiology

## 2015-03-12 NOTE — Progress Notes (Signed)
HPI: 74 year old female for evaluation of atrial flutter. Previously cared for by Dr. Doylene Canard. Admitted with atrial flutter in October 2016. Echocardiogram showed normal LV function, grade 1 diastolic dysfunction, moderate mitral regurgitation, moderate left atrial enlargement, mild right atrial enlargement and moderate tricuspid regurgitation. Nuclear study showed ejection fraction 66% and no ischemia or infarction. Patient treated with metoprolol, Cardizem and amiodarone and converted. Also placed on apixaban. TSH 6.596. Since discharge she denies significant dyspnea, chest pain, palpitations or syncope. Note the patient had Resection of a meningioma in January 2016. She has been in rehabilitation centers intermittently since then. She has fallen frequently.  Current Outpatient Prescriptions  Medication Sig Dispense Refill  . ACCU-CHEK SMARTVIEW test strip USE TO TEST BLOOD SUGAR 6 TO 8 TIMES A DAY 200 each 3  . acetaminophen (TYLENOL) 325 MG tablet Take 650 mg by mouth every 6 (six) hours as needed for mild pain.     Marland Kitchen ALPRAZolam (XANAX) 0.5 MG tablet Take 0.5 mg by mouth 3 (three) times daily as needed for anxiety.    Marland Kitchen amiodarone (PACERONE) 200 MG tablet Take 1 tablet (200 mg total) by mouth daily. 30 tablet 1  . anastrozole (ARIMIDEX) 1 MG tablet Take 1 mg by mouth daily.      Marland Kitchen antiseptic oral rinse (BIOTENE) LIQD 15 mLs by Mouth Rinse route daily as needed for dry mouth.    Marland Kitchen apixaban (ELIQUIS) 2.5 MG TABS tablet Take 1 tablet (2.5 mg total) by mouth 2 (two) times daily. 60 tablet 1  . ARIPiprazole (ABILIFY) 5 MG tablet Take 5 mg by mouth every morning.    . cholecalciferol (VITAMIN D) 1000 UNITS tablet Take 1,000 Units by mouth daily.    . cloNIDine (CATAPRES - DOSED IN MG/24 HR) 0.3 mg/24hr patch Place 0.3 mg onto the skin once a week. Apply on Fridays    . colchicine (COLCRYS) 0.6 MG tablet Take 0.6 mg by mouth daily.     . DULoxetine (CYMBALTA) 60 MG capsule Take 60 mg by mouth 2  (two) times daily.     Marland Kitchen HYDROcodone-acetaminophen (NORCO/VICODIN) 5-325 MG per tablet Take 1 tablet by mouth every 6 (six) hours as needed for moderate pain.    Marland Kitchen insulin NPH Human (HUMULIN N,NOVOLIN N) 100 UNIT/ML injection Inject 20 Units into the skin 2 (two) times daily before a meal.     . levETIRAcetam (KEPPRA) 1000 MG tablet Take 1,000 mg by mouth at bedtime. Take every night per MAR    . levETIRAcetam (KEPPRA) 500 MG tablet Take 500 mg by mouth every morning. Take every morning per MAR    . levothyroxine (SYNTHROID, LEVOTHROID) 125 MCG tablet Take 1 tablet (125 mcg total) by mouth daily before breakfast. 30 tablet 3  . Liraglutide 18 MG/3ML SOPN Inject 1.8 mg into the skin daily. Victoza    . magnesium hydroxide (MILK OF MAGNESIA) 400 MG/5ML suspension Take 30 mLs by mouth daily as needed for mild constipation.    . metoprolol (LOPRESSOR) 50 MG tablet Take 50 mg by mouth 2 (two) times daily.    . mometasone-formoterol (DULERA) 100-5 MCG/ACT AERO Inhale 2 puffs into the lungs 2 (two) times daily.    . ondansetron (ZOFRAN) 4 MG tablet Take 4 mg by mouth as needed for nausea or vomiting.    . pantoprazole (PROTONIX) 40 MG tablet Take 40 mg by mouth daily.    Marland Kitchen senna (SENOKOT) 8.6 MG TABS tablet Take 2 tablets by mouth at bedtime.  No current facility-administered medications for this visit.    Allergies  Allergen Reactions  . Latex Rash  . Sulfa Antibiotics Other (See Comments)    Unknown allergic reaction  . Penicillins Rash     Past Medical History  Diagnosis Date  . Diabetes mellitus   . Obesity   . Diabetes type 2, uncontrolled ([place])   . Hypoglycemia associated with diabetes ([place])   . Hypertension   . Combined hyperlipidemia   . Acquired autoimmune hypothyroidism   . Thyroiditis, autoimmune   . Abnormal liver function tests   . Sleep apnea, obstructive   . History of gastroesophageal reflux (GERD)   . Depression   . DM neuropathy with neurologic complication  ([place])   . COPD with acute exacerbation ([place])   . Goiter   . Vertigo   . Type II diabetes mellitus with peripheral angiopathy ([place])   . Gout   . Meningioma ([place])   . Seizure ([place])   . Fracture     Left wrist  . Cancer (HCC)     Breast  . Renal insufficiency     Past Surgical History  Procedure Laterality Date  . Laparoscopic gastric banding    . Back surgery    . Craniotomy Right 05/08/2014    Procedure: CRANIOTOMY FOR MENINGIOMA;  Surgeon: Ashok Pall, MD;  Location: [place]UR[facility];  Service: Neurosurgery;  Laterality: Right;  Right Craniotomy for meningioma  . Esophagogastroduodenoscopy (egd) with propofol N/A 05/22/2014    Procedure: ESOPHAGOGASTRODUODENOSCOPY (EGD) WITH PROPOFOL;  Surgeon: Wonda Horner, MD;  Location[facility];  Service: Endoscopy;  Laterality: N/A;  . Mastectomy Left   . Cholecystectomy    . Appendectomy      Social History   Social History  . Marital Status: Married    Spouse Name: N/A  . Number of Children: 2  . Years of Education: 12   Occupational History  . Retired    Social History Main Topics  . Smoking status: Never Smoker   . Smokeless tobacco: Not on file  . Alcohol Use: No  . Drug Use: No  . Sexual Activity: No   Other Topics Concern  . Not on file   Social History Narrative   Currently in nursing home for rehab.   Right-handed.   No caffeine use.    Family History  Problem Relation Age of Onset  . Diabetes Father   . Diabetes Sister   . Diabetes Brother   . Melanoma Mother   . CAD Father     ROS: no fevers or chills, productive cough, hemoptysis, dysphasia, odynophagia, melena, hematochezia, dysuria, hematuria, rash, seizure activity, orthopnea, PND, pedal edema, claudication. Remaining systems are negative.  Physical Exam:   Blood pressure 140/78, pulse 60, height 5' 6"  (1.676 m), weight 200 lb (90.719 kg).  General:  Well developed/well nourished in NAD Skin warm/dry Patient not depressed, flat affect No  peripheral clubbing Back-normal HEENT-normal/normal eyelids Neck supple/normal carotid upstroke bilaterally; no bruits; no JVD; no thyromegaly chest - CTA/ normal expansion CV - RRR/normal S1 and S2; no murmurs, rubs or gallops;  PMI nondisplaced Abdomen -NT/ND, no HSM, no mass, + bowel sounds, no bruit 2+ femoral pulses, no bruits Ext-no edema, chords, 1+ DP Neuro-grossly nonfocal

## 2015-03-15 ENCOUNTER — Ambulatory Visit (INDEPENDENT_AMBULATORY_CARE_PROVIDER_SITE_OTHER): Payer: Medicare Other | Admitting: Cardiology

## 2015-03-15 ENCOUNTER — Encounter: Payer: Self-pay | Admitting: Cardiology

## 2015-03-15 VITALS — BP 140/78 | HR 60 | Ht 66.0 in | Wt 200.0 lb

## 2015-03-15 DIAGNOSIS — I10 Essential (primary) hypertension: Secondary | ICD-10-CM

## 2015-03-15 DIAGNOSIS — I340 Nonrheumatic mitral (valve) insufficiency: Secondary | ICD-10-CM | POA: Insufficient documentation

## 2015-03-15 DIAGNOSIS — I484 Atypical atrial flutter: Secondary | ICD-10-CM | POA: Diagnosis not present

## 2015-03-15 DIAGNOSIS — I4892 Unspecified atrial flutter: Secondary | ICD-10-CM | POA: Insufficient documentation

## 2015-03-15 NOTE — Assessment & Plan Note (Signed)
Patient with previous atypical atrial flutter. She is in sinus rhythm on examination. Embolic risk factors include age greater than 109, female sex, diabetes mellitus And hypertension. She would benefit from anticoagulation long-term. However she falls frequently and had recent resection of meningioma. At this point I think the risk of anticoagulation outweighs the benefit and we will discontinue apixaban. Recent TSH normal. Echocardiogram showed normal LV function. I will continue amiodarone for now. We will check TSH, liver functions and chest x-ray in 2 months. Continue metoprolol. We discussed the possibility of ablation. This would be beneficial as I think her risk of anticoagulation long-term is significant. It is not clear given that it is atypical flutter that it would be ablated. However we could ask electrophysiology to review. They would like to discuss this among themselves before proceeding. They will contact us if they would like to see electrophysiology or we will readdress at next office visit.

## 2015-03-15 NOTE — Assessment & Plan Note (Signed)
Recent echocardiogram shows moderate mitral regurgitation and tricuspid regurgitation. Plan follow-up echoes in the future.

## 2015-03-15 NOTE — Assessment & Plan Note (Signed)
Blood pressure controlled. Continue present medications. 

## 2015-03-15 NOTE — Patient Instructions (Signed)
Medication Instructions:   STOP ELIQUIS   Follow-Up:  Your physician recommends that you schedule a follow-up appointment in: [place]

## 2015-03-16 NOTE — Telephone Encounter (Signed)
No notes

## 2015-03-17 ENCOUNTER — Other Ambulatory Visit: Payer: Self-pay | Admitting: Cardiology

## 2015-03-17 NOTE — Telephone Encounter (Signed)
°*  STAT* If patient is at the pharmacy, call can be transferred to refill team.   1. Which medications need to be refilled? (please list name of each medication and dose if known) Imidarione  2. Which pharmacy/location (including street and city if local pharmacy) is medication to be sent to?[phone] 3. Do they need a 30 day or 90 day supply? 90 and refills

## 2015-03-18 MED ORDER — AMIODARONE HCL 200 MG PO TABS: 200.0000 mg | ORAL_TABLET | Freq: Every day | ORAL | Status: DC

## 2015-03-18 NOTE — Telephone Encounter (Signed)
Rx(s) sent to pharmacy electronically.  

## 2015-03-29 DIAGNOSIS — N19 Unspecified kidney failure: Secondary | ICD-10-CM | POA: Diagnosis not present

## 2015-03-29 DIAGNOSIS — C50919 Malignant neoplasm of unspecified site of unspecified female breast: Secondary | ICD-10-CM | POA: Diagnosis not present

## 2015-03-29 DIAGNOSIS — Z905 Acquired absence of kidney: Secondary | ICD-10-CM | POA: Diagnosis not present

## 2015-03-29 DIAGNOSIS — M810 Age-related osteoporosis without current pathological fracture: Secondary | ICD-10-CM | POA: Diagnosis not present

## 2015-04-22 ENCOUNTER — Telehealth: Payer: Self-pay | Admitting: Cardiology

## 2015-04-22 NOTE — Telephone Encounter (Signed)
Received records from [facility] for appointment on 05/24/15 with Dr Stanford Breed.  Records given to [facility] (medical records) for Dr Jacalyn Lefevre schedule on 05/24/15. lp

## 2015-04-27 ENCOUNTER — Encounter: Payer: Self-pay | Admitting: Neurology

## 2015-04-27 ENCOUNTER — Ambulatory Visit (INDEPENDENT_AMBULATORY_CARE_PROVIDER_SITE_OTHER): Payer: Medicare Other | Admitting: Neurology

## 2015-04-27 VITALS — BP 134/77 | HR 60 | Ht 66.0 in | Wt 210.0 lb

## 2015-04-27 DIAGNOSIS — I484 Atypical atrial flutter: Secondary | ICD-10-CM

## 2015-04-27 DIAGNOSIS — R569 Unspecified convulsions: Secondary | ICD-10-CM | POA: Diagnosis not present

## 2015-04-27 DIAGNOSIS — G40909 Epilepsy, unspecified, not intractable, without status epilepticus: Secondary | ICD-10-CM | POA: Insufficient documentation

## 2015-04-27 DIAGNOSIS — D329 Benign neoplasm of meninges, unspecified: Secondary | ICD-10-CM

## 2015-04-27 MED ORDER — LAMOTRIGINE 100 MG PO TABS: 100.0000 mg | ORAL_TABLET | Freq: Two times a day (BID) | ORAL | Status: DC

## 2015-04-27 MED ORDER — LAMOTRIGINE 25 MG PO TABS: ORAL_TABLET | ORAL | Status: DC

## 2015-04-27 NOTE — Patient Instructions (Signed)
   Keppra 500mg   1 II Lamotrigine 25mg    1st week   i i    i i 2nd week   i i    Ii  Ii 3rd week   0 i    Iii Iii 4th week   0 0    Lamotrigine 100mg  twice a day
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
## 2015-05-19 NOTE — Progress Notes (Signed)
HPI: FU atrial flutter. Previously cared for by Dr. Doylene Canard. Admitted with atrial flutter in October 2016. Echocardiogram showed normal LV function, grade 1 diastolic dysfunction, moderate mitral regurgitation, moderate left atrial enlargement, mild right atrial enlargement and moderate tricuspid regurgitation. Nuclear study showed ejection fraction 66% and no ischemia or infarction. Patient treated with metoprolol, Cardizem and amiodarone and converted. Also placed on apixaban. TSH 6.596. At last office, apixaban DCed due to recent meningioma resection and frequent falls. Discussion initiated about possible atrial flutter ablation but pt wanted to discuss with family first. Since last seen, She has some dyspnea on exertion but improving. No orthopnea, PND, pedal edema, chest pain, palpitations or syncope. She did fall again recently.  Current Outpatient Prescriptions  Medication Sig Dispense Refill  . ACCU-CHEK SMARTVIEW test strip USE TO TEST BLOOD SUGAR 6 TO 8 TIMES A DAY 200 each 3  . acetaminophen (TYLENOL) 325 MG tablet Take 650 mg by mouth every 6 (six) hours as needed for mild pain.     Marland Kitchen ALPRAZolam (XANAX) 0.5 MG tablet Take 0.5 mg by mouth 3 (three) times daily as needed for anxiety.    Marland Kitchen amiodarone (PACERONE) 200 MG tablet Take 1 tablet (200 mg total) by mouth daily. 90 tablet 1  . anastrozole (ARIMIDEX) 1 MG tablet Take 1 mg by mouth daily.      Marland Kitchen antiseptic oral rinse (BIOTENE) LIQD 15 mLs by Mouth Rinse route daily as needed for dry mouth.    . ARIPiprazole (ABILIFY) 5 MG tablet Take 5 mg by mouth every morning.    . cholecalciferol (VITAMIN D) 1000 UNITS tablet Take 1,000 Units by mouth daily.    . cloNIDine (CATAPRES - DOSED IN MG/24 HR) 0.3 mg/24hr patch Place 0.3 mg onto the skin once a week. Apply on Fridays    . colchicine (COLCRYS) 0.6 MG tablet Take 0.6 mg by mouth daily.     . DULoxetine (CYMBALTA) 60 MG capsule Take 60 mg by mouth 2 (two) times daily.     Marland Kitchen HUMULIN R  500 UNIT/ML injection TAKE 20 UNITS 3 TIMES A DAY WITH ADDITIONAL AS NEEDED (WRITTEN FOR USE WITH A U-100 SYRINGE)  5  . HYDROcodone-acetaminophen (NORCO/VICODIN) 5-325 MG per tablet Take 1 tablet by mouth every 6 (six) hours as needed for moderate pain.    Marland Kitchen insulin NPH Human (HUMULIN N,NOVOLIN N) 100 UNIT/ML injection Inject 20 Units into the skin 2 (two) times daily before a meal.     . lamoTRIgine (LAMICTAL) 100 MG tablet Take 1 tablet (100 mg total) by mouth 2 (two) times daily. 60 tablet 11  . lamoTRIgine (LAMICTAL) 25 MG tablet One po twice a day for one week, then 2 tabs po twice a day xone week, then 3 tab twice a day 60 tablet 0  . levETIRAcetam (KEPPRA) 1000 MG tablet Take 1,000 mg by mouth at bedtime. Take every night per MAR    . levETIRAcetam (KEPPRA) 500 MG tablet Take 500 mg by mouth every morning. Take every morning per MAR    . levothyroxine (SYNTHROID, LEVOTHROID) 125 MCG tablet Take 1 tablet (125 mcg total) by mouth daily before breakfast. 30 tablet 3  . Liraglutide 18 MG/3ML SOPN Inject 1.8 mg into the skin daily. Victoza    . magnesium hydroxide (MILK OF MAGNESIA) 400 MG/5ML suspension Take 30 mLs by mouth daily as needed for mild constipation.    . metoprolol succinate (TOPROL-XL) 25 MG 24 hr tablet Take 25 mg  by mouth 2 (two) times daily.    . mometasone-formoterol (DULERA) 100-5 MCG/ACT AERO Inhale 2 puffs into the lungs 2 (two) times daily.    . ondansetron (ZOFRAN) 4 MG tablet Take 4 mg by mouth as needed for nausea or vomiting.    . pantoprazole (PROTONIX) 40 MG tablet Take 40 mg by mouth daily.    Marland Kitchen senna (SENOKOT) 8.6 MG TABS tablet Take 2 tablets by mouth at bedtime.      No current facility-administered medications for this visit.     Past Medical History  Diagnosis Date  . Diabetes mellitus   . Obesity   . Diabetes type 2, uncontrolled ([place])   . Hypoglycemia associated with diabetes ([place])   . Hypertension   . Combined hyperlipidemia   . Acquired autoimmune  hypothyroidism   . Thyroiditis, autoimmune   . Abnormal liver function tests   . Sleep apnea, obstructive   . History of gastroesophageal reflux (GERD)   . Depression   . DM neuropathy with neurologic complication ([place])   . COPD with acute exacerbation ([place])   . Goiter   . Vertigo   . Type II diabetes mellitus with peripheral angiopathy ([place])   . Gout   . Meningioma ([place])   . Seizure ([place])   . Fracture     Left wrist  . Cancer (HCC)     Breast  . Renal insufficiency     Past Surgical History  Procedure Laterality Date  . Laparoscopic gastric banding    . Back surgery    . Craniotomy Right 05/08/2014    Procedure: CRANIOTOMY FOR MENINGIOMA;  Surgeon: Ashok Pall, MD;  Location: [facility];  Service: Neurosurgery;  Laterality: Right;  Right Craniotomy for meningioma  . Esophagogastroduodenoscopy (egd) with propofol N/A 05/22/2014    Procedure: ESOPHAGOGASTRODUODENOSCOPY (EGD) WITH PROPOFOL;  Surgeon: Wonda Horner, MD;  [facility];  Service: Endoscopy;  Laterality: N/A;  . Mastectomy Left   . Cholecystectomy    . Appendectomy      Social History   Social History  . Marital Status: Married    Spouse Name: N/A  . Number of Children: 2  . Years of Education: 12   Occupational History  . Retired    Social History Main Topics  . Smoking status: Never Smoker   . Smokeless tobacco: Not on file  . Alcohol Use: No  . Drug Use: No  . Sexual Activity: No   Other Topics Concern  . Not on file   Social History Narrative   Currently in nursing home for rehab.   Right-handed.   No caffeine use.    Family History  Problem Relation Age of Onset  . Diabetes Father   . Diabetes Sister   . Diabetes Brother   . Melanoma Mother   . CAD Father     ROS: Back pain but no fevers or chills, productive cough, hemoptysis, dysphasia, odynophagia, melena, hematochezia, dysuria, hematuria, rash, seizure activity, orthopnea, PND, pedal edema, claudication. Remaining  systems are negative.  Physical Exam: Well-developed obese in no acute distress.  Skin is warm and dry.  HEENT is normal.  Neck is supple.  Chest is clear to auscultation with normal expansion.  Cardiovascular exam is regular rate and rhythm.  Abdominal exam nontender or distended. No masses palpated. Extremities show no edema. neuro grossly intact  ECG Sinus bradycardia at a rate of 56. Normal axis. Cannot rule out prior septal infarct.

## 2015-05-24 ENCOUNTER — Ambulatory Visit (HOSPITAL_COMMUNITY)
Admission: RE | Admit: 2015-05-24 | Discharge: 2015-05-24 | Disposition: A | Discharge status: Home / Self Care | Payer: Medicare Other | Source: Ambulatory Visit | Attending: Cardiology | Admitting: Cardiology

## 2015-05-24 ENCOUNTER — Ambulatory Visit (INDEPENDENT_AMBULATORY_CARE_PROVIDER_SITE_OTHER): Payer: Medicare Other | Admitting: Cardiology

## 2015-05-24 ENCOUNTER — Encounter: Payer: Self-pay | Admitting: Cardiology

## 2015-05-24 VITALS — BP 140/72 | HR 56

## 2015-05-24 DIAGNOSIS — I483 Typical atrial flutter: Secondary | ICD-10-CM | POA: Insufficient documentation

## 2015-05-24 DIAGNOSIS — E782 Mixed hyperlipidemia: Secondary | ICD-10-CM

## 2015-05-24 DIAGNOSIS — R0602 Shortness of breath: Secondary | ICD-10-CM | POA: Insufficient documentation

## 2015-05-24 DIAGNOSIS — E063 Autoimmune thyroiditis: Secondary | ICD-10-CM | POA: Diagnosis not present

## 2015-05-24 LAB — HEPATIC FUNCTION PANEL
ALK PHOS: 145 U/L — AB (ref 33–130)
ALT: 29 U/L (ref 6–29)
AST: 33 U/L (ref 10–35)
Albumin: 3.6 g/dL (ref 3.6–5.1)
BILIRUBIN DIRECT: 0.1 mg/dL (ref <=0.2)
BILIRUBIN INDIRECT: 0.6 mg/dL (ref 0.2–1.2)
TOTAL PROTEIN: 6.4 g/dL (ref 6.1–8.1)
Total Bilirubin: 0.7 mg/dL (ref 0.2–1.2)

## 2015-05-24 LAB — TSH: TSH: 13.68 mIU/L — ABNORMAL HIGH

## 2015-05-24 NOTE — Patient Instructions (Signed)
Medication Instructions:   NO CHANGE  Labwork:  Your physician recommends that you HAVE LAB WORK TODAY  Testing/Procedures:  A chest x-ray takes a picture of the organs and structures inside the chest, including the heart, lungs, and blood vessels. This test can show several things, including, whether the heart is enlarges; whether fluid is building up in the lungs; and whether pacemaker / defibrillator leads are still in place. AT [facility]  Follow-Up:  Your physician wants you to follow-up in: 6 MONTHS WITH DR CRENSHAW You will receive a reminder letter in the mail two months in advance. If you don't receive a letter, please call our office to schedule the follow-up appointment.   If you need a refill on your cardiac medications before your next appointment, please call your pharmacy.    

## 2015-05-24 NOTE — Assessment & Plan Note (Signed)
Blood pressure controlled. Continue present medications. 

## 2015-05-24 NOTE — Assessment & Plan Note (Signed)
Long discussion today with patient and family concerning options for therapy.One would be to continue amiodarone for rhythm control. Alternatively she could be referred to electrophysiology for consideration of ablation. This would alleviate the need for amiodarone and anticoagulation long-term. However she has been ill over the past year and they would prefer to be conservative. We will therefore continue amiodarone. Check TSH, liver functions and chest x-ray. I again discussed risks and benefits of anticoagulation. She would benefit from long-term anticoagulation to reduce the risk of CVA. However she falls frequently and is very unsteady on her feet. I therefore feel the risk outweighs the benefit. We will continue off of this medication.

## 2015-05-24 NOTE — Assessment & Plan Note (Signed)
Moderate mitral regurgitation on last echocardiogram. Plan repeat studies in the future.

## 2015-05-24 NOTE — Assessment & Plan Note (Signed)
Management per primary care. 

## 2015-05-26 ENCOUNTER — Telehealth: Payer: Self-pay | Admitting: *Deleted

## 2015-05-26 NOTE — Telephone Encounter (Signed)
Left message for pt to call.

## 2015-05-26 NOTE — Telephone Encounter (Signed)
-----   Message from [name], MD sent at 05/25/2015  5:03 AM EST ----- Change synthroid to 150 micrograms daily and fu primary care for abnormal tsh Morgan [name]

## 2015-05-28 MED ORDER — LEVOTHYROXINE SODIUM 150 MCG PO TABS: 150.0000 ug | ORAL_TABLET | Freq: Every day | ORAL | Status: DC

## 2015-05-28 NOTE — Telephone Encounter (Signed)
Follow up      Please call in presc to [facility] at [address] in [place]

## 2015-05-28 NOTE — Telephone Encounter (Signed)
Spoke with pt DTR, New script sent to the pharmacy  FORWARDED TO pcp

## 2015-06-29 ENCOUNTER — Other Ambulatory Visit: Payer: Self-pay | Admitting: Neurosurgery

## 2015-06-29 DIAGNOSIS — D329 Benign neoplasm of meninges, unspecified: Secondary | ICD-10-CM

## 2015-07-09 ENCOUNTER — Ambulatory Visit
Admission: RE | Admit: 2015-07-09 | Discharge: 2015-07-09 | Disposition: A | Discharge status: Home / Self Care | Payer: Medicare Other | Source: Ambulatory Visit | Attending: Neurosurgery | Admitting: Neurosurgery

## 2015-07-09 DIAGNOSIS — D329 Benign neoplasm of meninges, unspecified: Secondary | ICD-10-CM

## 2015-07-09 MED ORDER — GADOBENATE DIMEGLUMINE 529 MG/ML IV SOLN
10.0000 mL | Freq: Once | INTRAVENOUS | Status: AC | PRN
  Administered 2015-07-09: 10 mL via INTRAVENOUS

## 2015-07-10 DIAGNOSIS — S4291XA Fracture of right shoulder girdle, part unspecified, initial encounter for closed fracture: Secondary | ICD-10-CM

## 2015-07-10 HISTORY — DX: Fracture of right shoulder girdle, part unspecified, initial encounter for closed fracture: S42.91XA

## 2015-07-27 ENCOUNTER — Encounter: Payer: Self-pay | Admitting: Neurology

## 2015-07-27 ENCOUNTER — Ambulatory Visit (INDEPENDENT_AMBULATORY_CARE_PROVIDER_SITE_OTHER): Payer: Medicare Other | Admitting: Neurology

## 2015-07-27 VITALS — BP 151/65 | HR 58

## 2015-07-27 DIAGNOSIS — R531 Weakness: Secondary | ICD-10-CM

## 2015-07-27 DIAGNOSIS — M6289 Other specified disorders of muscle: Secondary | ICD-10-CM | POA: Diagnosis not present

## 2015-07-27 DIAGNOSIS — E1351 Other specified diabetes mellitus with diabetic peripheral angiopathy without gangrene: Secondary | ICD-10-CM | POA: Diagnosis not present

## 2015-07-27 DIAGNOSIS — G8194 Hemiplegia, unspecified affecting left nondominant side: Secondary | ICD-10-CM

## 2015-07-27 DIAGNOSIS — R569 Unspecified convulsions: Secondary | ICD-10-CM

## 2015-07-27 MED ORDER — LAMOTRIGINE ER 200 MG PO TB24: 200.0000 mg | ORAL_TABLET | Freq: Every day | ORAL | Status: DC
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
## 2015-08-09 ENCOUNTER — Other Ambulatory Visit: Payer: Self-pay | Admitting: Orthopedic Surgery

## 2015-08-09 DIAGNOSIS — M25511 Pain in right shoulder: Secondary | ICD-10-CM

## 2015-08-10 IMAGING — DX DG ABDOMEN 2V
2 series · 3 of 3 positions shown · non-contrast
Comparison: 05/23/2014

CLINICAL DATA: Constipation, epigastric abdominal pain, nausea,
type 2 diabetes, hypertension, COPD

EXAM:
ABDOMEN - 2 VIEW

[abdomen supine]
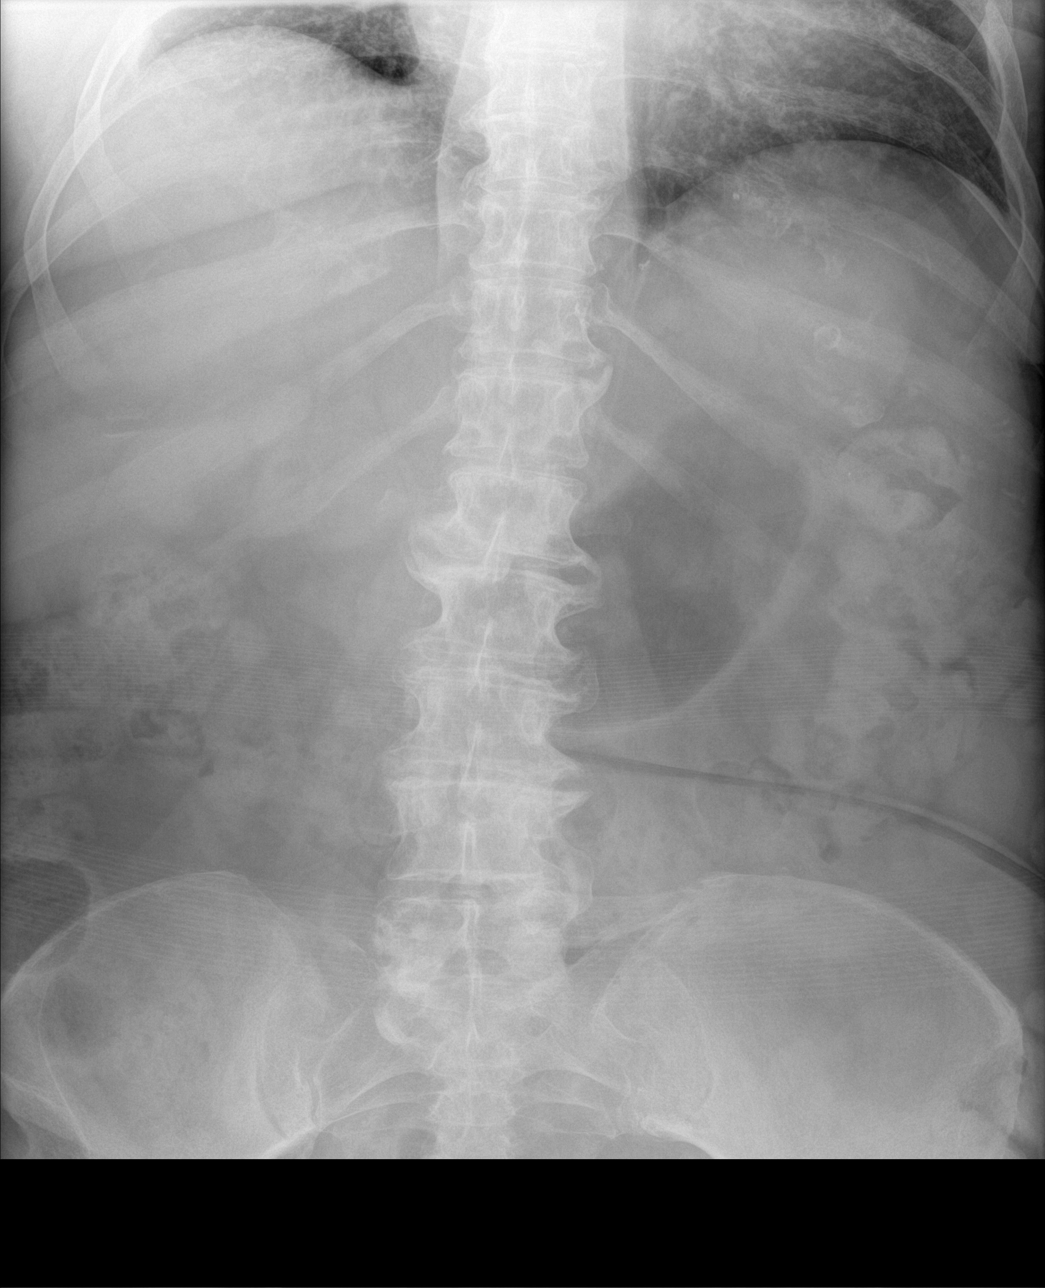

[Series 2: abdomen decu · 0.14mm/px · 2 of 2 slices shown]
[im 1/2]
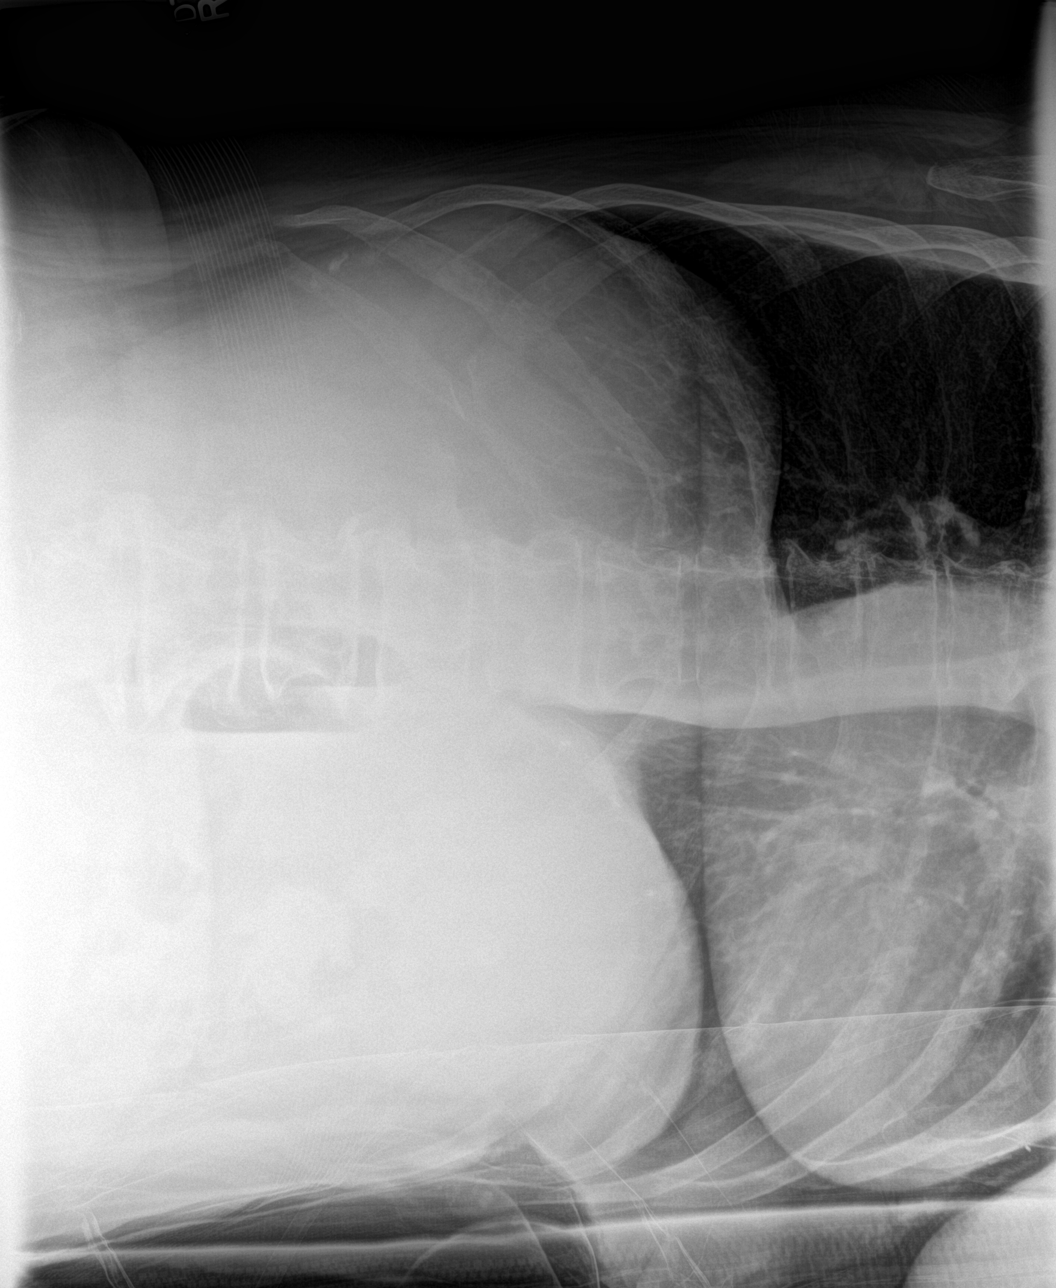
[im 2/2]
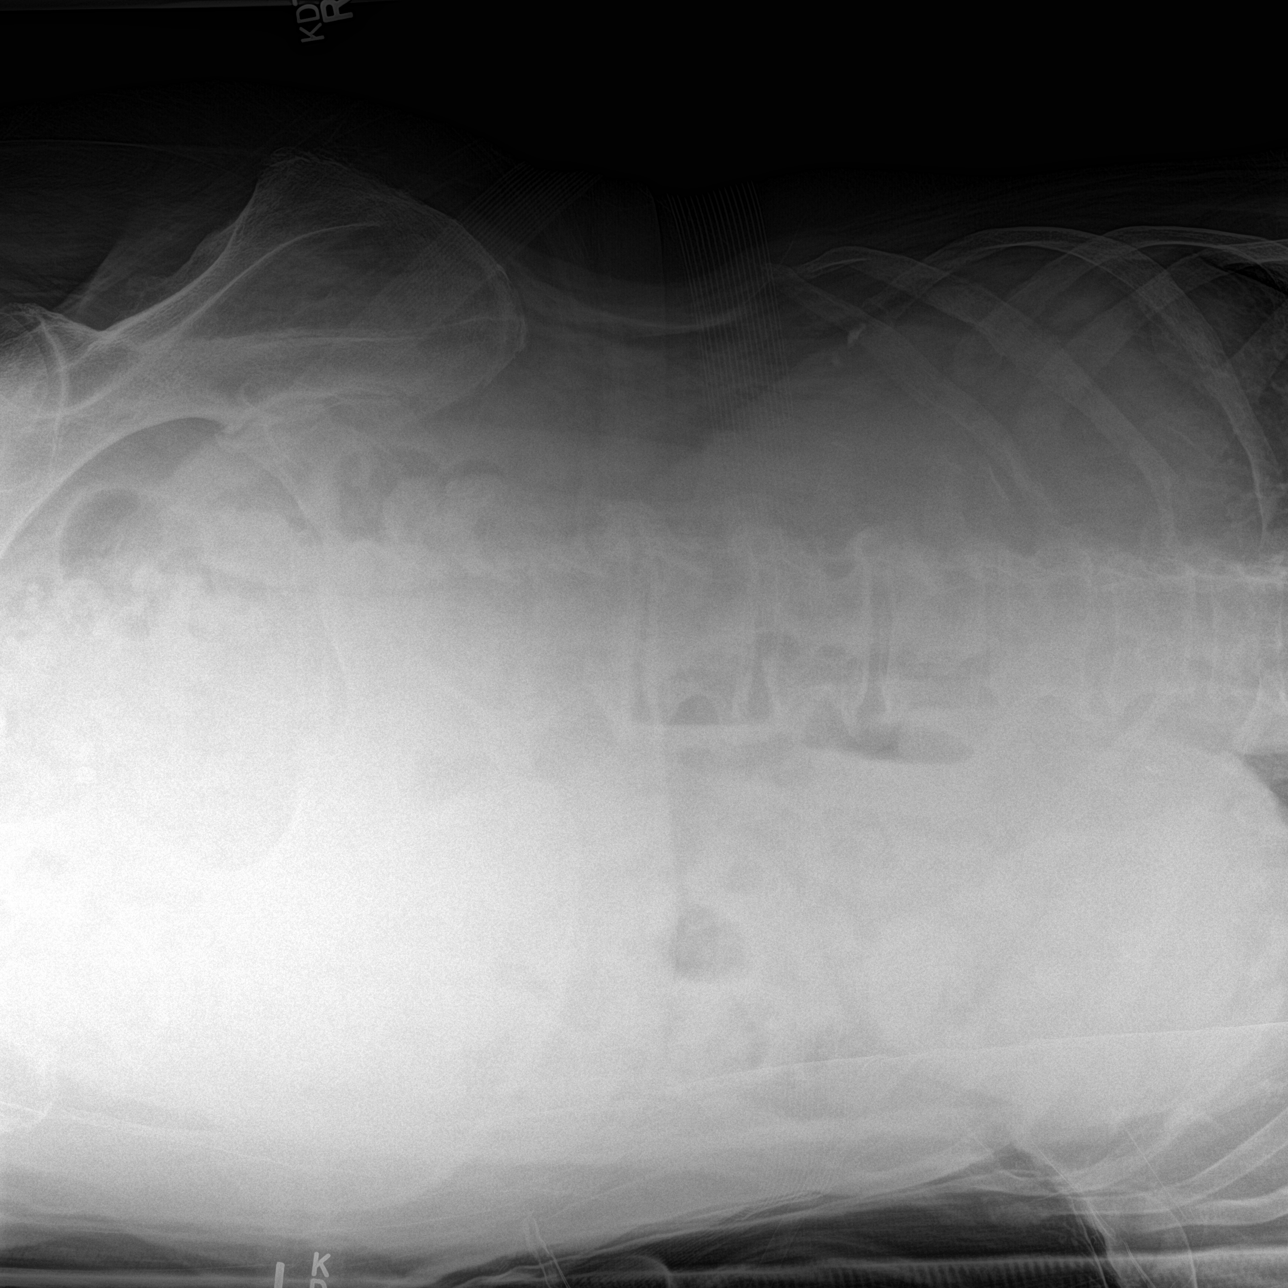

[3 of 3 positions shown; findings below may reference images not displayed]

FINDINGS: Mildly increased stool throughout colon.

Small bowel gas pattern normal.

Small amount of gas within stomach.

No bowel dilatation bowel wall thickening or evidence of
obstruction.

No free intraperitoneal air.

Bones demineralized with degenerative disc disease changes of
thoracolumbar spine.

No urinary tract calcification.
IMPRESSION: Mildly increased stool throughout colon.

## 2015-08-11 ENCOUNTER — Emergency Department (HOSPITAL_COMMUNITY): Payer: Medicare Other

## 2015-08-11 ENCOUNTER — Inpatient Hospital Stay (HOSPITAL_COMMUNITY)
Admission: EM | Admit: 2015-08-11 | Discharge: 2015-08-15 | DRG: 690 | Disposition: A | Discharge status: Skilled Nursing Facility | Payer: Medicare Other | Attending: Internal Medicine | Admitting: Internal Medicine

## 2015-08-11 ENCOUNTER — Encounter (HOSPITAL_COMMUNITY): Payer: Self-pay | Admitting: Emergency Medicine

## 2015-08-11 DIAGNOSIS — K219 Gastro-esophageal reflux disease without esophagitis: Secondary | ICD-10-CM | POA: Diagnosis present

## 2015-08-11 DIAGNOSIS — R269 Unspecified abnormalities of gait and mobility: Secondary | ICD-10-CM | POA: Diagnosis present

## 2015-08-11 DIAGNOSIS — E038 Other specified hypothyroidism: Secondary | ICD-10-CM

## 2015-08-11 DIAGNOSIS — I4892 Unspecified atrial flutter: Secondary | ICD-10-CM | POA: Diagnosis present

## 2015-08-11 DIAGNOSIS — J449 Chronic obstructive pulmonary disease, unspecified: Secondary | ICD-10-CM | POA: Diagnosis present

## 2015-08-11 DIAGNOSIS — R627 Adult failure to thrive: Secondary | ICD-10-CM | POA: Diagnosis present

## 2015-08-11 DIAGNOSIS — R569 Unspecified convulsions: Secondary | ICD-10-CM

## 2015-08-11 DIAGNOSIS — Z9181 History of falling: Secondary | ICD-10-CM

## 2015-08-11 DIAGNOSIS — R4182 Altered mental status, unspecified: Secondary | ICD-10-CM

## 2015-08-11 DIAGNOSIS — E785 Hyperlipidemia, unspecified: Secondary | ICD-10-CM | POA: Diagnosis present

## 2015-08-11 DIAGNOSIS — Z853 Personal history of malignant neoplasm of breast: Secondary | ICD-10-CM

## 2015-08-11 DIAGNOSIS — Z9119 Patient's noncompliance with other medical treatment and regimen: Secondary | ICD-10-CM

## 2015-08-11 DIAGNOSIS — G9389 Other specified disorders of brain: Secondary | ICD-10-CM | POA: Diagnosis present

## 2015-08-11 DIAGNOSIS — E1151 Type 2 diabetes mellitus with diabetic peripheral angiopathy without gangrene: Secondary | ICD-10-CM | POA: Diagnosis present

## 2015-08-11 DIAGNOSIS — N183 Chronic kidney disease, stage 3 (moderate): Secondary | ICD-10-CM | POA: Diagnosis present

## 2015-08-11 DIAGNOSIS — Z794 Long term (current) use of insulin: Secondary | ICD-10-CM

## 2015-08-11 DIAGNOSIS — IMO0002 Reserved for concepts with insufficient information to code with codable children: Secondary | ICD-10-CM | POA: Diagnosis present

## 2015-08-11 DIAGNOSIS — Z8673 Personal history of transient ischemic attack (TIA), and cerebral infarction without residual deficits: Secondary | ICD-10-CM

## 2015-08-11 DIAGNOSIS — G8194 Hemiplegia, unspecified affecting left nondominant side: Secondary | ICD-10-CM | POA: Diagnosis present

## 2015-08-11 DIAGNOSIS — R410 Disorientation, unspecified: Secondary | ICD-10-CM | POA: Diagnosis present

## 2015-08-11 DIAGNOSIS — N390 Urinary tract infection, site not specified: Principal | ICD-10-CM | POA: Diagnosis present

## 2015-08-11 DIAGNOSIS — E063 Autoimmune thyroiditis: Secondary | ICD-10-CM | POA: Diagnosis present

## 2015-08-11 DIAGNOSIS — E1140 Type 2 diabetes mellitus with diabetic neuropathy, unspecified: Secondary | ICD-10-CM | POA: Diagnosis present

## 2015-08-11 DIAGNOSIS — E11649 Type 2 diabetes mellitus with hypoglycemia without coma: Secondary | ICD-10-CM | POA: Diagnosis present

## 2015-08-11 DIAGNOSIS — I129 Hypertensive chronic kidney disease with stage 1 through stage 4 chronic kidney disease, or unspecified chronic kidney disease: Secondary | ICD-10-CM | POA: Diagnosis present

## 2015-08-11 DIAGNOSIS — I10 Essential (primary) hypertension: Secondary | ICD-10-CM | POA: Diagnosis present

## 2015-08-11 DIAGNOSIS — G4733 Obstructive sleep apnea (adult) (pediatric): Secondary | ICD-10-CM | POA: Diagnosis present

## 2015-08-11 DIAGNOSIS — Z6838 Body mass index (BMI) 38.0-38.9, adult: Secondary | ICD-10-CM

## 2015-08-11 DIAGNOSIS — E1165 Type 2 diabetes mellitus with hyperglycemia: Secondary | ICD-10-CM

## 2015-08-11 DIAGNOSIS — E669 Obesity, unspecified: Secondary | ICD-10-CM | POA: Diagnosis present

## 2015-08-11 DIAGNOSIS — F329 Major depressive disorder, single episode, unspecified: Secondary | ICD-10-CM | POA: Diagnosis present

## 2015-08-11 DIAGNOSIS — G4089 Other seizures: Secondary | ICD-10-CM | POA: Diagnosis present

## 2015-08-11 DIAGNOSIS — Z79899 Other long term (current) drug therapy: Secondary | ICD-10-CM

## 2015-08-11 DIAGNOSIS — Z86011 Personal history of benign neoplasm of the brain: Secondary | ICD-10-CM

## 2015-08-11 LAB — URINALYSIS, ROUTINE W REFLEX MICROSCOPIC
Bilirubin Urine: NEGATIVE
GLUCOSE, UA: NEGATIVE mg/dL
Hgb urine dipstick: NEGATIVE
Ketones, ur: NEGATIVE mg/dL
NITRITE: NEGATIVE
PROTEIN: 30 mg/dL — AB
Specific Gravity, Urine: 1.012 (ref 1.005–1.030)
pH: 6.5 (ref 5.0–8.0)

## 2015-08-11 LAB — GLUCOSE, CAPILLARY
GLUCOSE-CAPILLARY: 150 mg/dL — AB (ref 65–99)
GLUCOSE-CAPILLARY: 156 mg/dL — AB (ref 65–99)

## 2015-08-11 LAB — COMPREHENSIVE METABOLIC PANEL
ALBUMIN: 3.6 g/dL (ref 3.5–5.0)
ALT: 24 U/L (ref 14–54)
ANION GAP: 12 (ref 5–15)
AST: 32 U/L (ref 15–41)
Alkaline Phosphatase: 223 U/L — ABNORMAL HIGH (ref 38–126)
BILIRUBIN TOTAL: 0.8 mg/dL (ref 0.3–1.2)
BUN: 21 mg/dL — ABNORMAL HIGH (ref 6–20)
CALCIUM: 9.3 mg/dL (ref 8.9–10.3)
CO2: 24 mmol/L (ref 22–32)
Chloride: 101 mmol/L (ref 101–111)
Creatinine, Ser: 1.54 mg/dL — ABNORMAL HIGH (ref 0.44–1.00)
GFR, EST AFRICAN AMERICAN: 37 mL/min — AB (ref >60)
GFR, EST NON AFRICAN AMERICAN: 32 mL/min — AB (ref >60)
Glucose, Bld: 183 mg/dL — ABNORMAL HIGH (ref 65–99)
POTASSIUM: 4.2 mmol/L (ref 3.5–5.1)
Sodium: 137 mmol/L (ref 135–145)
TOTAL PROTEIN: 6.6 g/dL (ref 6.5–8.1)

## 2015-08-11 LAB — CBC
HCT: 35.9 % — ABNORMAL LOW (ref 36.0–46.0)
HEMOGLOBIN: 11.3 g/dL — AB (ref 12.0–15.0)
MCH: 29.2 pg (ref 26.0–34.0)
MCHC: 31.5 g/dL (ref 30.0–36.0)
MCV: 92.8 fL (ref 78.0–100.0)
Platelets: 187 10*3/uL (ref 150–400)
RBC: 3.87 MIL/uL (ref 3.87–5.11)
RDW: 15.4 % (ref 11.5–15.5)
WBC: 7.1 10*3/uL (ref 4.0–10.5)

## 2015-08-11 LAB — I-STAT CHEM 8, ED
BUN: 25 mg/dL — ABNORMAL HIGH (ref 6–20)
CREATININE: 1.5 mg/dL — AB (ref 0.44–1.00)
Calcium, Ion: 1.11 mmol/L — ABNORMAL LOW (ref 1.13–1.30)
Chloride: 98 mmol/L — ABNORMAL LOW (ref 101–111)
Glucose, Bld: 177 mg/dL — ABNORMAL HIGH (ref 65–99)
HEMATOCRIT: 39 % (ref 36.0–46.0)
Hemoglobin: 13.3 g/dL (ref 12.0–15.0)
Potassium: 4.1 mmol/L (ref 3.5–5.1)
SODIUM: 137 mmol/L (ref 135–145)
TCO2: 24 mmol/L (ref 0–100)

## 2015-08-11 LAB — URINE MICROSCOPIC-ADD ON: RBC / HPF: NONE SEEN RBC/hpf (ref 0–5)

## 2015-08-11 LAB — RAPID URINE DRUG SCREEN, HOSP PERFORMED
Amphetamines: NOT DETECTED
Barbiturates: NOT DETECTED
Benzodiazepines: NOT DETECTED
COCAINE: NOT DETECTED
OPIATES: NOT DETECTED
Tetrahydrocannabinol: NOT DETECTED

## 2015-08-11 LAB — I-STAT TROPONIN, ED: TROPONIN I, POC: 0 ng/mL (ref 0.00–0.08)

## 2015-08-11 LAB — DIFFERENTIAL
Basophils Absolute: 0 10*3/uL (ref 0.0–0.1)
Basophils Relative: 0 %
EOS ABS: 0.1 10*3/uL (ref 0.0–0.7)
EOS PCT: 2 %
LYMPHS ABS: 1.4 10*3/uL (ref 0.7–4.0)
Lymphocytes Relative: 20 %
MONO ABS: 0.4 10*3/uL (ref 0.1–1.0)
MONOS PCT: 6 %
NEUTROS PCT: 72 %
Neutro Abs: 5.1 10*3/uL (ref 1.7–7.7)

## 2015-08-11 LAB — APTT: aPTT: 22 seconds — ABNORMAL LOW (ref 24–37)

## 2015-08-11 LAB — PROTIME-INR
INR: 1.1 (ref 0.00–1.49)
Prothrombin Time: 14.4 seconds (ref 11.6–15.2)

## 2015-08-11 LAB — ETHANOL: Alcohol, Ethyl (B): <5 mg/dL (ref <5)

## 2015-08-11 LAB — CBG MONITORING, ED
GLUCOSE-CAPILLARY: 47 mg/dL — AB (ref 65–99)
GLUCOSE-CAPILLARY: 150 mg/dL — AB (ref 65–99)
Glucose-Capillary: 60 mg/dL — ABNORMAL LOW (ref 65–99)

## 2015-08-11 LAB — TSH: TSH: 5.417 u[IU]/mL — AB (ref 0.350–4.500)

## 2015-08-11 MED ORDER — INSULIN ASPART 100 UNIT/ML ~~LOC~~ SOLN
0.0000 [IU] | Freq: Three times a day (TID) | SUBCUTANEOUS | Status: DC
  Administered 2015-08-12: 5 [IU] via SUBCUTANEOUS
  Administered 2015-08-12: 3 [IU] via SUBCUTANEOUS
  Administered 2015-08-12: 2 [IU] via SUBCUTANEOUS
  Administered 2015-08-13: 3 [IU] via SUBCUTANEOUS
  Administered 2015-08-13: 8 [IU] via SUBCUTANEOUS
  Administered 2015-08-13: 3 [IU] via SUBCUTANEOUS
  Administered 2015-08-14: 5 [IU] via SUBCUTANEOUS
  Administered 2015-08-14 – 2015-08-15 (×3): 3 [IU] via SUBCUTANEOUS

## 2015-08-11 MED ORDER — CLONIDINE HCL 0.3 MG/24HR TD PTWK
0.3000 mg | MEDICATED_PATCH | TRANSDERMAL | Status: DC
Start: 2015-08-15 — End: 2015-08-15
  Administered 2015-08-15: 0.3 mg via TRANSDERMAL
  Filled 2015-08-11: qty 1

## 2015-08-11 MED ORDER — MOMETASONE FURO-FORMOTEROL FUM 100-5 MCG/ACT IN AERO
2.0000 | INHALATION_SPRAY | Freq: Two times a day (BID) | RESPIRATORY_TRACT | Status: DC
  Administered 2015-08-12 – 2015-08-15 (×7): 2 via RESPIRATORY_TRACT
  Filled 2015-08-11: qty 8.8

## 2015-08-11 MED ORDER — INSULIN NPH (HUMAN) (ISOPHANE) 100 UNIT/ML ~~LOC~~ SUSP: 10.0000 [IU] | Freq: Two times a day (BID) | SUBCUTANEOUS | Status: DC

## 2015-08-11 MED ORDER — ALPRAZOLAM 0.5 MG PO TABS: 0.5000 mg | ORAL_TABLET | Freq: Three times a day (TID) | ORAL | Status: DC | PRN

## 2015-08-11 MED ORDER — SODIUM CHLORIDE 0.9 % IV BOLUS (SEPSIS)
1000.0000 mL | Freq: Once | INTRAVENOUS | Status: AC
  Administered 2015-08-11: 1000 mL via INTRAVENOUS

## 2015-08-11 MED ORDER — MAGNESIUM HYDROXIDE 400 MG/5ML PO SUSP: 30.0000 mL | Freq: Every day | ORAL | Status: DC | PRN

## 2015-08-11 MED ORDER — DEXTROSE 5 % IV SOLN
1.0000 g | INTRAVENOUS | Status: DC
  Administered 2015-08-12 – 2015-08-13 (×2): 1 g via INTRAVENOUS
  Filled 2015-08-11 (×3): qty 10

## 2015-08-11 MED ORDER — IBUPROFEN 400 MG PO TABS
400.0000 mg | ORAL_TABLET | Freq: Once | ORAL | Status: AC
  Administered 2015-08-11: 400 mg via ORAL
  Filled 2015-08-11: qty 1

## 2015-08-11 MED ORDER — ACETAMINOPHEN 325 MG PO TABS
650.0000 mg | ORAL_TABLET | Freq: Four times a day (QID) | ORAL | Status: DC | PRN
  Administered 2015-08-12: 650 mg via ORAL
  Filled 2015-08-11: qty 2

## 2015-08-11 MED ORDER — ONDANSETRON HCL 4 MG/2ML IJ SOLN: 4.0000 mg | Freq: Four times a day (QID) | INTRAMUSCULAR | Status: DC | PRN

## 2015-08-11 MED ORDER — LEVOTHYROXINE SODIUM 150 MCG PO TABS
150.0000 ug | ORAL_TABLET | Freq: Every day | ORAL | Status: DC
  Administered 2015-08-12 – 2015-08-15 (×4): 150 ug via ORAL
  Filled 2015-08-11: qty 1
  Filled 2015-08-11: qty 2
  Filled 2015-08-11: qty 1
  Filled 2015-08-11 (×2): qty 2
  Filled 2015-08-11: qty 1
  Filled 2015-08-11: qty 2
  Filled 2015-08-11: qty 1
  Filled 2015-08-11: qty 2

## 2015-08-11 MED ORDER — COLCHICINE 0.6 MG PO TABS
0.6000 mg | ORAL_TABLET | Freq: Every day | ORAL | Status: DC
Start: 2015-08-11 — End: 2015-08-15
  Administered 2015-08-11 – 2015-08-15 (×5): 0.6 mg via ORAL
  Filled 2015-08-11 (×5): qty 1

## 2015-08-11 MED ORDER — ARIPIPRAZOLE 5 MG PO TABS
5.0000 mg | ORAL_TABLET | Freq: Every day | ORAL | Status: DC
  Administered 2015-08-11 – 2015-08-14 (×4): 5 mg via ORAL
  Filled 2015-08-11 (×4): qty 1

## 2015-08-11 MED ORDER — DEXTROSE 5 % IV SOLN
1.0000 g | Freq: Once | INTRAVENOUS | Status: AC
  Administered 2015-08-11: 1 g via INTRAVENOUS
  Filled 2015-08-11: qty 10

## 2015-08-11 MED ORDER — METOPROLOL TARTRATE 25 MG PO TABS
25.0000 mg | ORAL_TABLET | Freq: Two times a day (BID) | ORAL | Status: DC
Start: 2015-08-11 — End: 2015-08-15
  Administered 2015-08-11 – 2015-08-15 (×8): 25 mg via ORAL
  Filled 2015-08-11 (×8): qty 1

## 2015-08-11 MED ORDER — PANTOPRAZOLE SODIUM 40 MG PO TBEC
40.0000 mg | DELAYED_RELEASE_TABLET | Freq: Every day | ORAL | Status: DC
Start: 2015-08-11 — End: 2015-08-15
  Administered 2015-08-11 – 2015-08-15 (×5): 40 mg via ORAL
  Filled 2015-08-11 (×5): qty 1

## 2015-08-11 MED ORDER — LAMOTRIGINE 100 MG PO TABS
200.0000 mg | ORAL_TABLET | Freq: Every day | ORAL | Status: DC
  Administered 2015-08-11 – 2015-08-15 (×4): 200 mg via ORAL
  Filled 2015-08-11: qty 2
  Filled 2015-08-11 (×3): qty 8

## 2015-08-11 MED ORDER — LAMOTRIGINE 25 MG PO TABS: 100.0000 mg | ORAL_TABLET | Freq: Two times a day (BID) | ORAL | Status: DC

## 2015-08-11 MED ORDER — VITAMIN D 1000 UNITS PO TABS
1000.0000 [IU] | ORAL_TABLET | Freq: Every day | ORAL | Status: DC
  Administered 2015-08-12 – 2015-08-15 (×4): 1000 [IU] via ORAL
  Filled 2015-08-11 (×4): qty 1

## 2015-08-11 MED ORDER — AMIODARONE HCL 200 MG PO TABS
200.0000 mg | ORAL_TABLET | Freq: Every day | ORAL | Status: DC
  Administered 2015-08-12 – 2015-08-15 (×4): 200 mg via ORAL
  Filled 2015-08-11 (×4): qty 1

## 2015-08-11 MED ORDER — BIOTENE DRY MOUTH MT LIQD: 15.0000 mL | Freq: Every day | OROMUCOSAL | Status: DC | PRN

## 2015-08-11 MED ORDER — SENNA 8.6 MG PO TABS
2.0000 | ORAL_TABLET | Freq: Every day | ORAL | Status: DC
  Administered 2015-08-11 – 2015-08-14 (×4): 17.2 mg via ORAL
  Filled 2015-08-11 (×4): qty 2

## 2015-08-11 MED ORDER — DULOXETINE HCL 60 MG PO CPEP
60.0000 mg | ORAL_CAPSULE | Freq: Two times a day (BID) | ORAL | Status: DC
  Administered 2015-08-11 – 2015-08-15 (×8): 60 mg via ORAL
  Filled 2015-08-11 (×8): qty 1

## 2015-08-11 MED ORDER — DEXTROSE 50 % IV SOLN
INTRAVENOUS | Status: AC
  Administered 2015-08-11: 50 mL
  Filled 2015-08-11: qty 50

## 2015-08-11 MED ORDER — HEPARIN SODIUM (PORCINE) 5000 UNIT/ML IJ SOLN
5000.0000 [IU] | Freq: Three times a day (TID) | INTRAMUSCULAR | Status: DC
  Administered 2015-08-11 – 2015-08-15 (×11): 5000 [IU] via SUBCUTANEOUS
  Filled 2015-08-11 (×11): qty 1

## 2015-08-11 NOTE — ED Notes (Signed)
Pt having low blood glucose, Dr. Fabio Neighbors notified. One orange juice given CBG up to 60 1/2 amp of D50 given Dr. Fabio Neighbors notified.

## 2015-08-11 NOTE — ED Notes (Signed)
Report attempted, RN to call back. 

## 2015-08-11 NOTE — H&P (Signed)
History and Physical    Morgan [name] JAS:[id number] DOB: [birth year] DOA: 08/11/2015  Referring MD/NP/PA: Dr. Rosana Hoes PCP: Gilford Rile, MD Outpatient Specialists: Dr. Krista Blue (neurology) Patient coming from: Home  Chief Complaint: Dizziness, AMS, emesis  HPI: Morgan [name] is a 75 y.o. 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  Prolonged post-op course and rehab she eventually returned back home in October.  She has been having frequent falls, gait instability, over the past several weeks.  Her neurologist saw her mid April.  Noted that her MRI ordered at the end of March didn't show anything new.  Tried adjusting the timing of her meds thinking it might be a medication issue.  Over the past 3 days she has had nausea, vomiting, decreased PO intake, and significantly worse difficulty with ambulation.  She is now unable to get out of chair even with assistance.  Husband also reports episodes of confusion over past 3 days, foul smelling urine.  Patient denies dysuria.  States she does feel hot.  Review of Systems: As per HPI otherwise 10 point review of systems negative.    Past Medical History  Diagnosis Date  . Diabetes mellitus   . Obesity   . Diabetes type 2, uncontrolled ([place])   . Hypoglycemia associated with diabetes ([place])   . Hypertension   . Combined hyperlipidemia   . Acquired autoimmune hypothyroidism   . Thyroiditis, autoimmune   . Abnormal liver function tests   . Sleep apnea, obstructive   . History of gastroesophageal reflux (GERD)   . Depression   . DM neuropathy with neurologic complication ([place])   . COPD with acute exacerbation ([place])   . Goiter   . Vertigo   . Type II diabetes mellitus with peripheral angiopathy ([place])   . Gout   . Meningioma ([place])   . Seizure ([place])   . Fracture     Left wrist  . Cancer (HCC)     Breast  . Renal  insufficiency     Past Surgical History  Procedure Laterality Date  . Laparoscopic gastric banding    . Back surgery    . Craniotomy Right 05/08/2014    Procedure: CRANIOTOMY FOR MENINGIOMA;  Surgeon: Ashok Pall, MD;  Location: [facility]UR[facility];  Service: Neurosurgery;  Laterality: Right;  Right Craniotomy for meningioma  . Esophagogastroduodenoscopy (egd) with propofol N/A 05/22/2014    Procedure: ESOPHAGOGASTRODUODENOSCOPY (EGD) WITH PROPOFOL;  Surgeon: Wonda Horner, MD;  Location: [facility] END[facility]COPY;  Service: Endoscopy;  Laterality: N/A;  . Mastectomy Left   . Cholecystectomy    . Appendectomy       reports that she has never smoked. She does not have any smokeless tobacco history on file. She reports that she does not drink alcohol or use illicit drugs.  Allergies  Allergen Reactions  . Latex Rash  . Sulfa Antibiotics Other (See Comments)    Unknown allergic reaction  . Exenatide Rash  . Metformin Rash    RENAL INSUFFICIENCY  . Penicillins Rash    Family History  Problem Relation Age of Onset  . Diabetes Father   . Diabetes Sister   . Diabetes Brother   . Melanoma Mother   . CAD Father      Prior to Admission medications   Medication Sig Start Date End Date Taking? Authorizing Provider  acetaminophen (TYLENOL) 325 MG tablet Take 650 mg by mouth  every 6 (six) hours as needed for mild pain.    Yes Historical Provider, MD  ALPRAZolam Duanne Moron) 0.5 MG tablet Take 0.5 mg by mouth 3 (three) times daily as needed for anxiety.   Yes Historical Provider, MD  amiodarone (PACERONE) 200 MG tablet Take 1 tablet (200 mg total) by mouth daily. 03/18/15  Yes [name], MD  antiseptic oral rinse (BIOTENE) LIQD 15 mLs by Mouth Rinse route daily as needed for dry mouth.   Yes Historical Provider, MD  ARIPiprazole (ABILIFY) 5 MG tablet Take 5 mg by mouth at bedtime.   Yes Historical Provider, MD  cholecalciferol (VITAMIN D) 1000 UNITS tablet Take 1,000 Units by mouth daily.   Yes Historical  Provider, MD  cloNIDine (CATAPRES - DOSED IN MG/24 HR) 0.3 mg/24hr patch Place 0.3 mg onto the skin once a week. Apply on Fridays   Yes Historical Provider, MD  colchicine (COLCRYS) 0.6 MG tablet Take 0.6 mg by mouth daily.    Yes Historical Provider, MD  DULoxetine (CYMBALTA) 60 MG capsule Take 60 mg by mouth 2 (two) times daily.    Yes Historical Provider, MD  furosemide (LASIX) 40 MG tablet Take 40 mg by mouth daily.   Yes Historical Provider, MD  insulin NPH Human (HUMULIN N,NOVOLIN N) 100 UNIT/ML injection Inject 20 Units into the skin 2 (two) times daily before a meal.    Yes Historical Provider, MD  lamoTRIgine (LAMICTAL) 100 MG tablet Take 100 mg by mouth 2 (two) times daily. 08/03/15  Yes Historical Provider, MD  levothyroxine (SYNTHROID, LEVOTHROID) 150 MCG tablet Take 1 tablet (150 mcg total) by mouth daily before breakfast. 05/28/15  Yes [name], MD  Liraglutide 18 MG/3ML SOPN Inject 1.8 mg into the skin daily. Victoza   Yes Historical Provider, MD  magnesium hydroxide (MILK OF MAGNESIA) 400 MG/5ML suspension Take 30 mLs by mouth daily as needed for mild constipation.   Yes Historical Provider, MD  metoprolol (LOPRESSOR) 50 MG tablet Take 25 mg by mouth 2 (two) times daily. 07/20/15  Yes Historical Provider, MD  mometasone-formoterol (DULERA) 100-5 MCG/ACT AERO Inhale 2 puffs into the lungs 2 (two) times daily.   Yes Historical Provider, MD  pantoprazole (PROTONIX) 40 MG tablet Take 40 mg by mouth daily.   Yes Historical Provider, MD  senna (SENOKOT) 8.6 MG TABS tablet Take 2 tablets by mouth at bedtime.    Yes Historical Provider, MD  traMADol (ULTRAM) 50 MG tablet TAKE 1 TABLET BY MOUTH EVERY 4 TO 6 HOURS 07/16/15  Yes Historical Provider, MD    Physical Exam: Filed Vitals:   08/11/15 1815 08/11/15 1830 08/11/15 1845 08/11/15 1900  BP: 151/69 161/67 154/65 159/89  Pulse: 61 61 62 62  Temp:      TempSrc:      Resp: 14 22 22 22   SpO2: 100% 99% 99% 99%       Constitutional: NAD, calm, comfortable, sweating Filed Vitals:   08/11/15 1815 08/11/15 1830 08/11/15 1845 08/11/15 1900  BP: 151/69 161/67 154/65 159/89  Pulse: 61 61 62 62  Temp:      TempSrc:      Resp: 14 22 22 22   SpO2: 100% 99% 99% 99%   Eyes: PERRL, lids and conjunctivae normal ENMT: Mucous membranes are moist. Posterior pharynx clear of any exudate or lesions.Normal dentition.  Neck: normal, supple, no masses, no thyromegaly Respiratory: clear to auscultation bilaterally, no wheezing, no crackles. Normal respiratory effort. No accessory muscle use.  Cardiovascular: Regular  rate and rhythm, no murmurs / rubs / gallops. No extremity edema. 2+ pedal pulses. No carotid bruits.  Abdomen: no tenderness, no masses palpated. No hepatosplenomegaly. Bowel sounds positive.  Musculoskeletal: no clubbing / cyanosis. No joint deformity upper and lower extremities. Good ROM, no contractures. Normal muscle tone.  Skin: no rashes, lesions, ulcers. No induration Neurologic: Speech slow, shuffling gait, needs significant support for ambulation. Psychiatric: Normal judgment and insight. Alert and oriented x 3. Normal mood.    Labs on Admission: I have personally reviewed following labs and imaging studies  CBC:  Recent Labs Lab 08/11/15 1319 08/11/15 1330  WBC 7.1  --   NEUTROABS 5.1  --   HGB 11.3* 13.3  HCT 35.9* 39.0  MCV 92.8  --   PLT 187  --    Basic Metabolic Panel:  Recent Labs Lab 08/11/15 1319 08/11/15 1330  NA 137 137  K 4.2 4.1  CL 101 98*  CO2 24  --   GLUCOSE 183* 177*  BUN 21* 25*  CREATININE 1.54* 1.50*  CALCIUM 9.3  --    GFR: CrCl cannot be calculated (Unknown ideal weight.). Liver Function Tests:  Recent Labs Lab 08/11/15 1319  AST 32  ALT 24  ALKPHOS 223*  BILITOT 0.8  PROT 6.6  ALBUMIN 3.6   No results for input(s): LIPASE, AMYLASE in the last 168 hours. No results for input(s): AMMONIA in the last 168 hours. Coagulation  Profile:  Recent Labs Lab 08/11/15 1319  INR 1.10   Cardiac Enzymes: No results for input(s): CKTOTAL, CKMB, CKMBINDEX, TROPONINI in the last 168 hours. BNP (last 3 results) No results for input(s): PROBNP in the last 8760 hours. HbA1C: No results for input(s): HGBA1C in the last 72 hours. CBG: No results for input(s): GLUCAP in the last 168 hours. Lipid Profile: No results for input(s): CHOL, HDL, LDLCALC, TRIG, CHOLHDL, LDLDIRECT in the last 72 hours. Thyroid Function Tests: No results for input(s): TSH, T4TOTAL, FREET4, T3FREE, THYROIDAB in the last 72 hours. Anemia Panel: No results for input(s): VITAMINB12, FOLATE, FERRITIN, TIBC, IRON, RETICCTPCT in the last 72 hours. Urine analysis:    Component Value Date/Time   COLORURINE YELLOW 08/11/2015 1835   APPEARANCEUR CLOUDY* 08/11/2015 1835   LABSPEC 1.012 08/11/2015 1835   PHURINE 6.5 08/11/2015 1835   GLUCOSEU NEGATIVE 08/11/2015 1835   HGBUR NEGATIVE 08/11/2015 1835   BILIRUBINUR NEGATIVE 08/11/2015 [place] 08/11/2015 1835   PROTEINUR 30* 08/11/2015 1835   UROBILINOGEN 0.2 01/15/2015 2141   NITRITE NEGATIVE 08/11/2015 1835   LEUKOCYTESUR MODERATE* 08/11/2015 1835   Sepsis Labs: @LABRCNTIP (procalcitonin:4,lacticidven:4) )No results found for this or any previous visit (from the past 240 hour(s)).   Radiological Exams on Admission: Ct Head Wo Contrast  08/11/2015  CLINICAL DATA:  Nausea with vomiting and dizziness for 3 days. Previous resection of meningioma. EXAM: CT HEAD WITHOUT CONTRAST TECHNIQUE: Contiguous axial images were obtained from the base of the skull through the vertex without intravenous contrast. COMPARISON:  MRI on 07/09/2015 and CT on 07/01/2014 FINDINGS: There is no evidence of intracranial hemorrhage, brain edema, or other signs of acute infarction. There is no evidence of intracranial mass lesion or mass effect. No abnormal extraaxial fluid collections are identified. Previous right  frontal craniotomy defect again seen with associated encephalomalacia involving the right frontal lobe. Old lacunar infarct again seen involving the anterior limb of the left internal capsule. Ventricles are stable in size. IMPRESSION: No acute intracranial abnormality. Stable right frontal encephalomalacia and  previous right frontal craniotomy. 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 Electronically Signed   By: Earle Gell M.D.   On: 08/11/2015 14:32    EKG: Independently reviewed.  Assessment/Plan Principal Problem:   UTI (lower urinary tract infection) Active Problems:   Hypertension   Diabetes type 2, uncontrolled (HCC)   Acquired autoimmune hypothyroidism   Left hemiparesis (HCC)   Partial seizure (HCC)   Abnormality of gait   Delirium  Delirium, acute on chronic decline due to suspected UTI -  UA is suspicious for UTI but by no means a slam dunk  Rocephin  Cultures pending  Zofran PRN nausea  If no improvement in patient status with UTI treatment, then would consider getting neurology consult inpatient  H/o partial seizures - continue Lamictal  Abnormality of gait -  PT/OT eval and treat  Suspect acute UTI on top of chronic R frontal encephalomalacia is to blame here   Chronic Left Hemiparesis  Hypothyroidism -  Continue synthroid  Checking TSH as this was elevated in Feb, may need to adjust dose of synthroid  DM2 -  Patient is hypoglycemic today in ED due to decreased PO intake  Holding NPH  SSI moderate dose AC/HS  CBG checks Q2H due to hypoglycemia in ED  Hypoglycemia being treated with D50 and PO glucose now   DVT prophylaxis: Heparin [place] Code Status: Full Family Communication: Husband at bedside Consults called: None Admission status: Admit to obs   [place], [place] [facility] Pager [phone] from 7PM-7AM  If 7AM-7PM, please contact the day physician for the patient [email or web address] Password [email or web address]  08/11/2015, 8:06 PM

## 2015-08-11 NOTE — ED Provider Notes (Signed)
CSN: [id number]     Arrival date & time 08/11/15  1255 History   First MD Initiated Contact with Patient 08/11/15 1546     Chief Complaint  Patient presents with  . Dizziness  . Hypertension  . Emesis     (Consider location/radiation/quality/duration/timing/severity/associated sxs/prior Treatment) HPI Patient is a 75 year old female with a history of meningioma, status post resection, with a complicated course including seizures, dizziness, frequent falls and gait instability presents for evaluation of loss of balance, difficult ambulating, dizziness, nausea, vomiting. She has had frequent falls over the past several weeks, has injured her right shoulder, and has been seen by her primary care doctor with x-rays that did not show any fractures. She is scheduled for a follow-up MRI.  Apparently over the past 3 days she has developed worsening trouble with ambulation, worsening dizziness and nausea with several episodes of vomiting. She denies any abdominal pain associated with this. She has not had fevers, chills, or infectious symptoms. Daughter reports that she has had episodes like this associated with urinary tract infections in the past.  She apparently has been unable to get out of her chair very well, and her husband has been helping her, but has become unable to help her out of her chair. She denies any recent head trauma in the past 3 days.   Patient and husband both report that she has been intermittently confused over the past 24 hours. Last night she was disoriented, and did not know where she was. She said this morning, and through the afternoon she has been intermittently disoriented as to her surroundings.  Past Medical History  Diagnosis Date  . Diabetes mellitus   . Obesity   . Diabetes type 2, uncontrolled ([place])   . Hypoglycemia associated with diabetes ([place])   . Hypertension   . Combined hyperlipidemia   . Acquired autoimmune hypothyroidism   . Thyroiditis, autoimmune   .  Abnormal liver function tests   . Sleep apnea, obstructive   . History of gastroesophageal reflux (GERD)   . Depression   . DM neuropathy with neurologic complication ([place])   . COPD with acute exacerbation ([place])   . Goiter   . Vertigo   . Type II diabetes mellitus with peripheral angiopathy ([place])   . Gout   . Meningioma ([place])   . Seizure ([place])   . Fracture     Left wrist  . Cancer (HCC)     Breast  . Renal insufficiency    Past Surgical History  Procedure Laterality Date  . Laparoscopic gastric banding    . Back surgery    . Craniotomy Right 05/08/2014    Procedure: CRANIOTOMY FOR MENINGIOMA;  Surgeon: Ashok Pall, MD;  Location: [place] [facility]UR[facility] ORS;  Service: Neurosurgery;  Laterality: Right;  Right Craniotomy for meningioma  . Esophagogastroduodenoscopy (egd) with propofol N/A 05/22/2014    Procedure: ESOPHAGOGASTRODUODENOSCOPY (EGD) WITH PROPOFOL;  Surgeon: Wonda Horner, MD;  Location[facility];  Service: Endoscopy;  Laterality: N/A;  . Mastectomy Left   . Cholecystectomy    . Appendectomy     Family History  Problem Relation Age of Onset  . Diabetes Father   . Diabetes Sister   . Diabetes Brother   . Melanoma Mother   . CAD Father    Social History  Substance Use Topics  . Smoking status: Never Smoker   . Smokeless tobacco: None  . Alcohol Use: No   OB History    No data available  Review of Systems  Gastrointestinal: Positive for nausea and vomiting. Negative for abdominal pain and abdominal distention.  Musculoskeletal: Positive for arthralgias and gait problem.  Neurological: Positive for dizziness, weakness and light-headedness. Negative for tremors, syncope and headaches.  All other systems reviewed and are negative.     Allergies  Latex; Sulfa antibiotics; Exenatide; Metformin; and Penicillins  Home Medications   Prior to Admission medications   Medication Sig Start Date End Date Taking? Authorizing Provider  acetaminophen (TYLENOL) 325 MG  tablet Take 650 mg by mouth every 6 (six) hours as needed for mild pain.    Yes Historical Provider, MD  ALPRAZolam Duanne Moron) 0.5 MG tablet Take 0.5 mg by mouth 3 (three) times daily as needed for anxiety.   Yes Historical Provider, MD  amiodarone (PACERONE) 200 MG tablet Take 1 tablet (200 mg total) by mouth daily. 03/18/15  Yes [name], MD  antiseptic oral rinse (BIOTENE) LIQD 15 mLs by Mouth Rinse route daily as needed for dry mouth.   Yes Historical Provider, MD  ARIPiprazole (ABILIFY) 5 MG tablet Take 5 mg by mouth at bedtime.   Yes Historical Provider, MD  cholecalciferol (VITAMIN D) 1000 UNITS tablet Take 1,000 Units by mouth daily.   Yes Historical Provider, MD  cloNIDine (CATAPRES - DOSED IN MG/24 HR) 0.3 mg/24hr patch Place 0.3 mg onto the skin once a week. Apply on Fridays   Yes Historical Provider, MD  colchicine (COLCRYS) 0.6 MG tablet Take 0.6 mg by mouth daily.    Yes Historical Provider, MD  DULoxetine (CYMBALTA) 60 MG capsule Take 60 mg by mouth 2 (two) times daily.    Yes Historical Provider, MD  furosemide (LASIX) 40 MG tablet Take 40 mg by mouth daily.   Yes Historical Provider, MD  insulin NPH Human (HUMULIN N,NOVOLIN N) 100 UNIT/ML injection Inject 20 Units into the skin 2 (two) times daily before a meal.    Yes Historical Provider, MD  lamoTRIgine (LAMICTAL) 100 MG tablet Take 100 mg by mouth 2 (two) times daily. 08/03/15  Yes Historical Provider, MD  levothyroxine (SYNTHROID, LEVOTHROID) 150 MCG tablet Take 1 tablet (150 mcg total) by mouth daily before breakfast. 05/28/15  Yes [name], MD  Liraglutide 18 MG/3ML SOPN Inject 1.8 mg into the skin daily. Victoza   Yes Historical Provider, MD  magnesium hydroxide (MILK OF MAGNESIA) 400 MG/5ML suspension Take 30 mLs by mouth daily as needed for mild constipation.   Yes Historical Provider, MD  metoprolol (LOPRESSOR) 50 MG tablet Take 25 mg by mouth 2 (two) times daily. 07/20/15  Yes Historical Provider, MD   mometasone-formoterol (DULERA) 100-5 MCG/ACT AERO Inhale 2 puffs into the lungs 2 (two) times daily.   Yes Historical Provider, MD  pantoprazole (PROTONIX) 40 MG tablet Take 40 mg by mouth daily.   Yes Historical Provider, MD  senna (SENOKOT) 8.6 MG TABS tablet Take 2 tablets by mouth at bedtime.    Yes Historical Provider, MD  traMADol (ULTRAM) 50 MG tablet TAKE 1 TABLET BY MOUTH EVERY 4 TO 6 HOURS 07/16/15  Yes Historical Provider, MD   BP 169/64 mmHg  Pulse 68  Temp(Src) 98.2 F (36.8 C) (Oral)  Resp 26  SpO2 97% Physical Exam  Constitutional: She is oriented to person, place, and time.  Chronically ill-appearing  HENT:  Head: Normocephalic and atraumatic.  Eyes: EOM are normal. Pupils are equal, round, and reactive to light.  Neck: Normal range of motion. No JVD present.  Cardiovascular: Normal rate,  regular rhythm and normal heart sounds.   Pulmonary/Chest: Effort normal and breath sounds normal. No respiratory distress. She has no wheezes. She has no rales.  Abdominal: Soft. Distention: obese. There is no tenderness. There is no rebound and no guarding.  Neurological: She is alert and oriented to person, place, and time. No cranial nerve deficit.  Slow speech Shuffling gait, unable to ambulate without significant support and assistance Needs assistance getting out of bed   Skin: Skin is warm and dry.  Nursing note and vitals reviewed.   ED Course  Procedures (including critical care time) Labs Review Labs Reviewed  APTT - Abnormal; Notable for the following:    aPTT 22 (*)    All other components within normal limits  CBC - Abnormal; Notable for the following:    Hemoglobin 11.3 (*)    HCT 35.9 (*)    All other components within normal limits  COMPREHENSIVE METABOLIC PANEL - Abnormal; Notable for the following:    Glucose, Bld 183 (*)    BUN 21 (*)    Creatinine, Ser 1.54 (*)    Alkaline Phosphatase 223 (*)    GFR calc non Af Amer 32 (*)    GFR calc Af Amer 37 (*)     All other components within normal limits  URINALYSIS, ROUTINE W REFLEX MICROSCOPIC (NOT AT [facility]) - Abnormal; Notable for the following:    APPearance CLOUDY (*)    Protein, ur 30 (*)    Leukocytes, UA MODERATE (*)    All other components within normal limits  URINE MICROSCOPIC-ADD ON - Abnormal; Notable for the following:    Squamous Epithelial / LPF 0-5 (*)    Bacteria, UA FEW (*)    All other components within normal limits  I-STAT CHEM 8, ED - Abnormal; Notable for the following:    Chloride 98 (*)    BUN 25 (*)    Creatinine, Ser 1.50 (*)    Glucose, Bld 177 (*)    Calcium, Ion 1.11 (*)    All other components within normal limits  CBG MONITORING, ED - Abnormal; Notable for the following:    Glucose-Capillary 47 (*)    All other components within normal limits  ETHANOL  PROTIME-INR  DIFFERENTIAL  URINE RAPID DRUG SCREEN, HOSP PERFORMED  TSH  I-STAT TROPOININ, ED    Imaging Review Ct Head Wo Contrast  08/11/2015  CLINICAL DATA:  Nausea with vomiting and dizziness for 3 days. Previous resection of meningioma. EXAM: CT HEAD WITHOUT CONTRAST TECHNIQUE: Contiguous axial images were obtained from the base of the skull through the vertex without intravenous contrast. COMPARISON:  MRI on 07/09/2015 and CT on 07/01/2014 FINDINGS: There is no evidence of intracranial hemorrhage, brain edema, or other signs of acute infarction. There is no evidence of intracranial mass lesion or mass effect. No abnormal extraaxial fluid collections are identified. Previous right frontal craniotomy defect again seen with associated encephalomalacia involving the right frontal lobe. Old lacunar infarct again seen involving the anterior limb of the left internal capsule. Ventricles are stable in size. IMPRESSION: No acute intracranial abnormality. Stable right frontal encephalomalacia and previous right frontal craniotomy. 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 Electronically Signed   By: Earle Gell M.D.    On: 08/11/2015 14:32   I have personally reviewed and evaluated these images and lab results as part of my medical decision-making.   EKG Interpretation   Date/Time:  Wednesday Aug 11 2015 13:09:53 EDT Ventricular Rate:  60 PR Interval:  160 QRS Duration: 96 QT Interval:  454 QTC Calculation: 454 R Axis:   -9 Text Interpretation:  Normal sinus rhythm Cannot rule out Anterior infarct  , age undetermined T wave abnormality Artifact Abnormal ECG Confirmed by  Carmin Muskrat  MD [id number]) on 08/11/2015 8:03:20 PM      MDM   Final diagnoses:  Delirium  UTI (lower urinary tract infection)    Patient presents with confusion, weakness, significant ataxia. Differential diagnosis includes infectious, metabolic, structural neurologic etiologies.  CT of the head obtained, shows only stable postoperative changes with no evidence of acute infarct, ischemia, or hemorrhage. She is mildly hypertensive on arrival, but when reviewing her old visits, she appears to be at her baseline blood pressure.   No evidence of anemia, creatinine appears to be a baseline, doubt acute insult. Urinalysis does show moderate leukocytes, 6-30 white blood cells, and patient does have a history of delirium and confusion with urinary tract infections in the past. Considering her clinical picture, will empirically treat with Rocephin.  Patient has a nonfocal, but globally slowed neurologic exam, with significant ataxia, and is a significant fall risk, (has already had several falls recently), I believe that observation is warranted based on her functional status, inability to safely care for herself, and absence of a caregiver at home.    Leata Mouse, MD 08/11/15 [id number]  Carmin Muskrat, MD 08/13/15 [phone]

## 2015-08-11 NOTE — ED Notes (Signed)
Admitting MD on the bedside.

## 2015-08-11 NOTE — ED Notes (Addendum)
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 Pt is AAOX4 at this time. No deficits noted at this time. Pt fell on R shoulder and c/o pain there, scheduled MRI for shoulder soon. Pt in NAD. Pt endorses symptoms for three days

## 2015-08-11 NOTE — ED Notes (Signed)
CBG 47. One orange juice given

## 2015-08-12 ENCOUNTER — Observation Stay (HOSPITAL_COMMUNITY): Payer: Medicare Other

## 2015-08-12 DIAGNOSIS — F329 Major depressive disorder, single episode, unspecified: Secondary | ICD-10-CM | POA: Diagnosis present

## 2015-08-12 DIAGNOSIS — R569 Unspecified convulsions: Secondary | ICD-10-CM | POA: Diagnosis not present

## 2015-08-12 DIAGNOSIS — Z794 Long term (current) use of insulin: Secondary | ICD-10-CM | POA: Diagnosis not present

## 2015-08-12 DIAGNOSIS — E1165 Type 2 diabetes mellitus with hyperglycemia: Secondary | ICD-10-CM | POA: Diagnosis not present

## 2015-08-12 DIAGNOSIS — G4733 Obstructive sleep apnea (adult) (pediatric): Secondary | ICD-10-CM | POA: Diagnosis present

## 2015-08-12 DIAGNOSIS — E1151 Type 2 diabetes mellitus with diabetic peripheral angiopathy without gangrene: Secondary | ICD-10-CM | POA: Diagnosis present

## 2015-08-12 DIAGNOSIS — R410 Disorientation, unspecified: Secondary | ICD-10-CM | POA: Diagnosis present

## 2015-08-12 DIAGNOSIS — F05 Delirium due to known physiological condition: Secondary | ICD-10-CM

## 2015-08-12 DIAGNOSIS — E785 Hyperlipidemia, unspecified: Secondary | ICD-10-CM | POA: Diagnosis present

## 2015-08-12 DIAGNOSIS — K219 Gastro-esophageal reflux disease without esophagitis: Secondary | ICD-10-CM | POA: Diagnosis present

## 2015-08-12 DIAGNOSIS — E669 Obesity, unspecified: Secondary | ICD-10-CM | POA: Diagnosis present

## 2015-08-12 DIAGNOSIS — G9389 Other specified disorders of brain: Secondary | ICD-10-CM | POA: Diagnosis present

## 2015-08-12 DIAGNOSIS — I4892 Unspecified atrial flutter: Secondary | ICD-10-CM | POA: Diagnosis present

## 2015-08-12 DIAGNOSIS — Z6838 Body mass index (BMI) 38.0-38.9, adult: Secondary | ICD-10-CM | POA: Diagnosis not present

## 2015-08-12 DIAGNOSIS — R627 Adult failure to thrive: Secondary | ICD-10-CM | POA: Diagnosis present

## 2015-08-12 DIAGNOSIS — E1140 Type 2 diabetes mellitus with diabetic neuropathy, unspecified: Secondary | ICD-10-CM | POA: Diagnosis present

## 2015-08-12 DIAGNOSIS — I129 Hypertensive chronic kidney disease with stage 1 through stage 4 chronic kidney disease, or unspecified chronic kidney disease: Secondary | ICD-10-CM | POA: Diagnosis present

## 2015-08-12 DIAGNOSIS — Z9181 History of falling: Secondary | ICD-10-CM | POA: Diagnosis not present

## 2015-08-12 DIAGNOSIS — R269 Unspecified abnormalities of gait and mobility: Secondary | ICD-10-CM | POA: Diagnosis present

## 2015-08-12 DIAGNOSIS — I10 Essential (primary) hypertension: Secondary | ICD-10-CM | POA: Diagnosis not present

## 2015-08-12 DIAGNOSIS — Z853 Personal history of malignant neoplasm of breast: Secondary | ICD-10-CM | POA: Diagnosis not present

## 2015-08-12 DIAGNOSIS — Z79899 Other long term (current) drug therapy: Secondary | ICD-10-CM | POA: Diagnosis not present

## 2015-08-12 DIAGNOSIS — N183 Chronic kidney disease, stage 3 (moderate): Secondary | ICD-10-CM | POA: Diagnosis present

## 2015-08-12 DIAGNOSIS — N390 Urinary tract infection, site not specified: Secondary | ICD-10-CM | POA: Diagnosis present

## 2015-08-12 DIAGNOSIS — E11649 Type 2 diabetes mellitus with hypoglycemia without coma: Secondary | ICD-10-CM | POA: Diagnosis present

## 2015-08-12 DIAGNOSIS — E038 Other specified hypothyroidism: Secondary | ICD-10-CM | POA: Diagnosis present

## 2015-08-12 DIAGNOSIS — G4089 Other seizures: Secondary | ICD-10-CM | POA: Diagnosis present

## 2015-08-12 DIAGNOSIS — G8194 Hemiplegia, unspecified affecting left nondominant side: Secondary | ICD-10-CM | POA: Diagnosis present

## 2015-08-12 DIAGNOSIS — Z8673 Personal history of transient ischemic attack (TIA), and cerebral infarction without residual deficits: Secondary | ICD-10-CM | POA: Diagnosis not present

## 2015-08-12 DIAGNOSIS — J449 Chronic obstructive pulmonary disease, unspecified: Secondary | ICD-10-CM | POA: Diagnosis present

## 2015-08-12 DIAGNOSIS — Z9119 Patient's noncompliance with other medical treatment and regimen: Secondary | ICD-10-CM | POA: Diagnosis not present

## 2015-08-12 DIAGNOSIS — Z86011 Personal history of benign neoplasm of the brain: Secondary | ICD-10-CM | POA: Diagnosis not present

## 2015-08-12 LAB — GLUCOSE, CAPILLARY
GLUCOSE-CAPILLARY: 110 mg/dL — AB (ref 65–99)
GLUCOSE-CAPILLARY: 107 mg/dL — AB (ref 65–99)
GLUCOSE-CAPILLARY: 114 mg/dL — AB (ref 65–99)
GLUCOSE-CAPILLARY: 217 mg/dL — AB (ref 65–99)
GLUCOSE-CAPILLARY: 173 mg/dL — AB (ref 65–99)
GLUCOSE-CAPILLARY: 158 mg/dL — AB (ref 65–99)
Glucose-Capillary: 150 mg/dL — ABNORMAL HIGH (ref 65–99)

## 2015-08-12 LAB — AMMONIA: Ammonia: 55 umol/L — ABNORMAL HIGH (ref 9–35)

## 2015-08-12 LAB — VITAMIN B12: Vitamin B-12: 304 pg/mL (ref 180–914)

## 2015-08-12 LAB — MAGNESIUM: MAGNESIUM: 2.1 mg/dL (ref 1.7–2.4)

## 2015-08-12 LAB — FOLATE: Folate: 39.2 ng/mL (ref >5.9)

## 2015-08-12 LAB — PHOSPHORUS: PHOSPHORUS: 3.5 mg/dL (ref 2.5–4.6)

## 2015-08-12 IMAGING — CT CT HEAD W/O CM
1 series · 16 of 30 positions shown, 20 images · non-contrast
Comparison: CT 05/07/2014

CLINICAL DATA: Right frontal meningioma resection 05/08/2014.
Altered mental status

EXAM:
CT HEAD WITHOUT CONTRAST
TECHNIQUE: Contiguous axial images were obtained from the base of the skull
through the vertex without intravenous contrast.

[Series 2: head 5.0 h30s · axial · 0.45mm/px · z∈[-101,+39]mm · 16 of 32 slices shown, 20 images]
[im 2/32  brain]
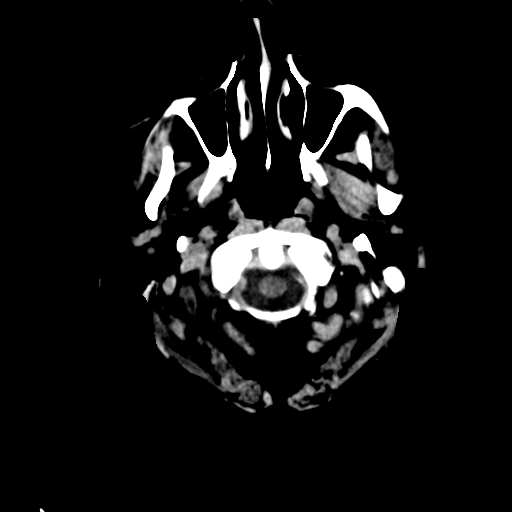
[im 2/32  bone]
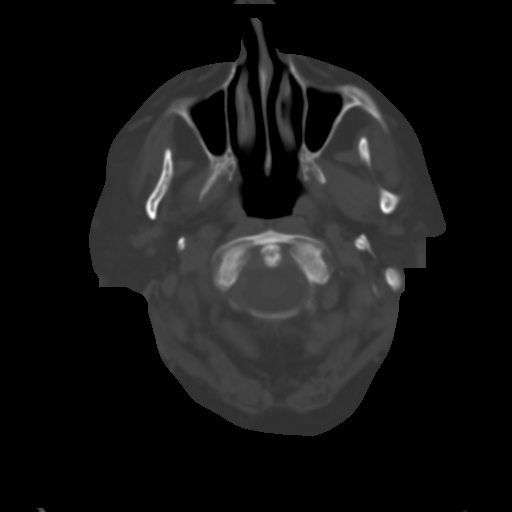
[im 4/32  brain]
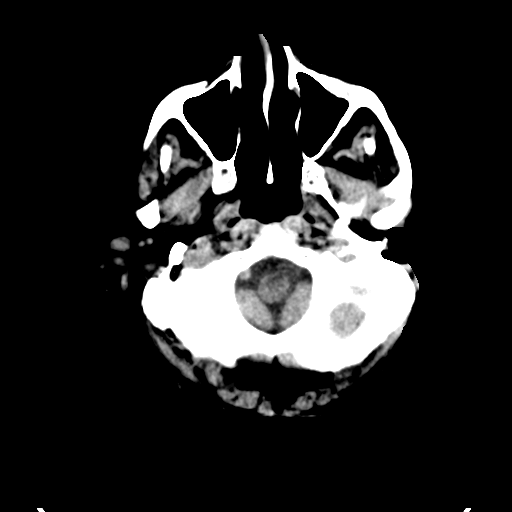
[im 6/32  brain]
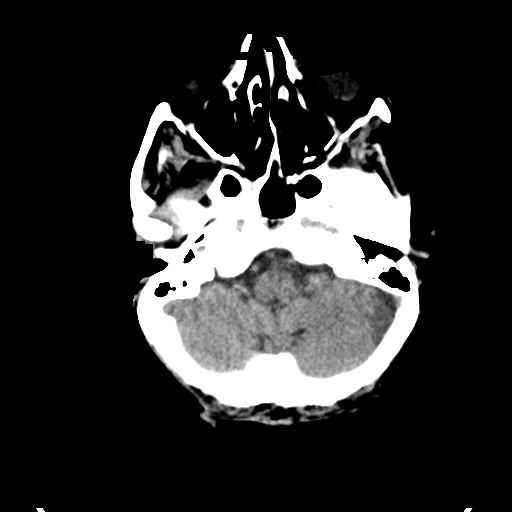
[im 8/32  brain]
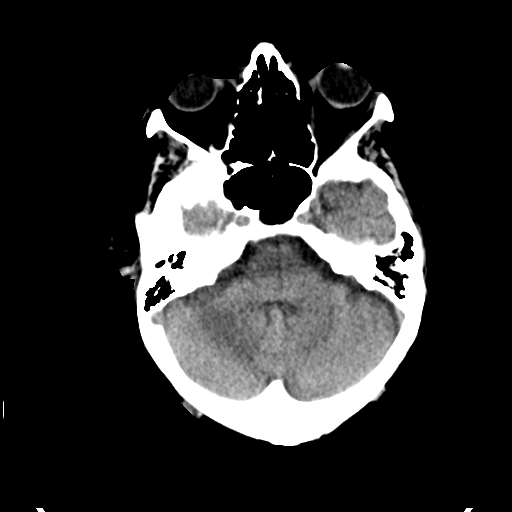
[im 9/32  brain]
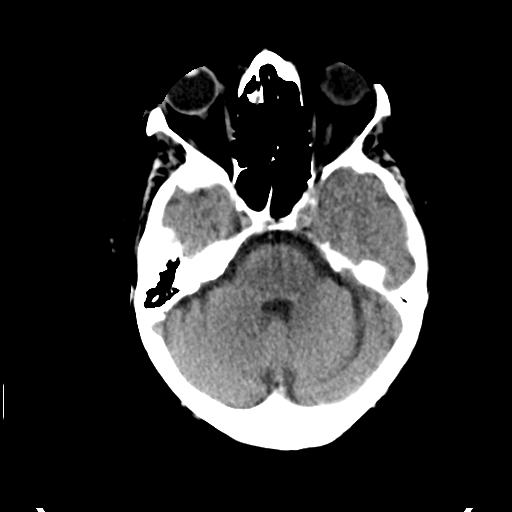
[im 9/32  bone]
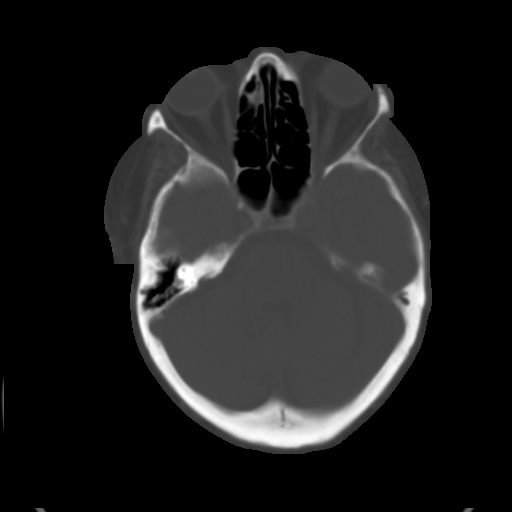
[im 11/32  brain]
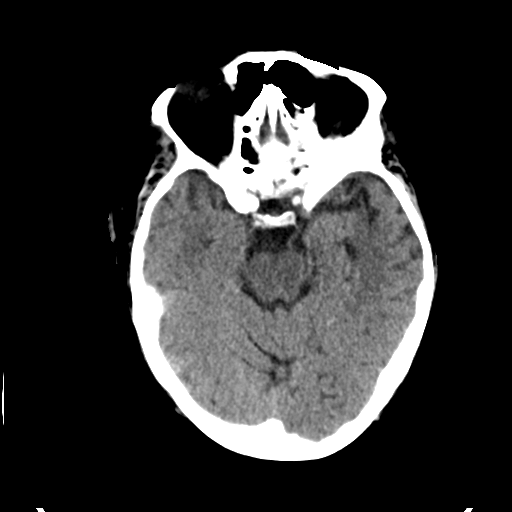
[im 13/32  brain]
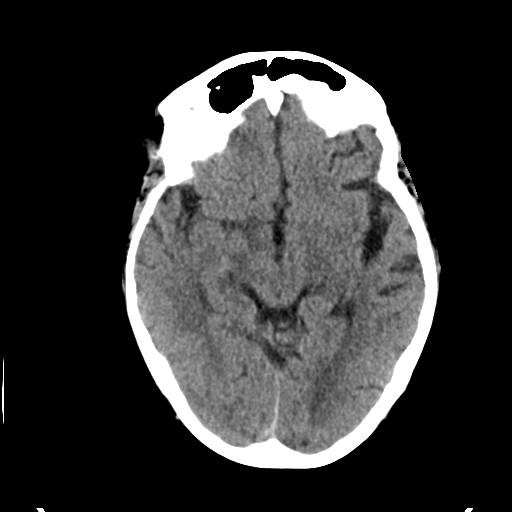
[im 15/32  brain]
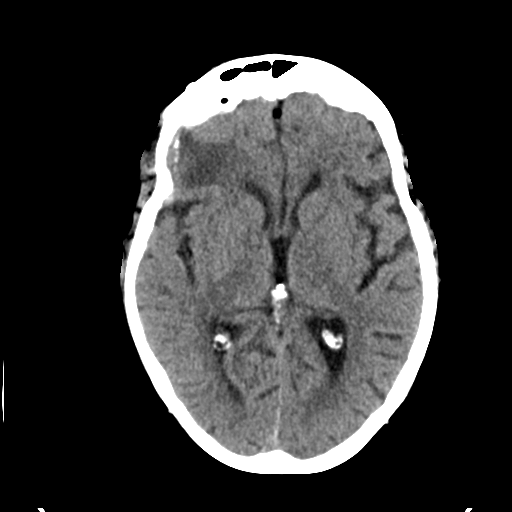
[im 17/32  brain]
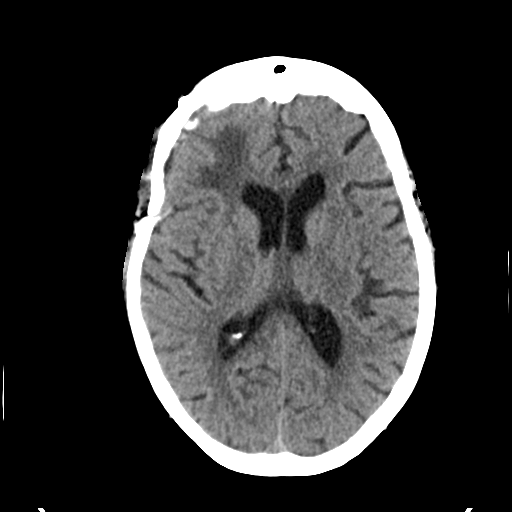
[im 17/32  bone]
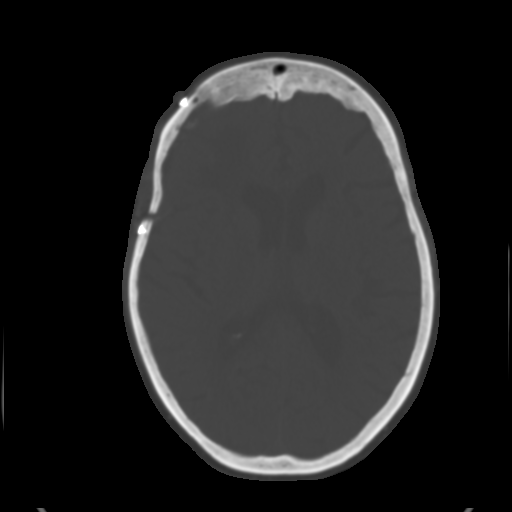
[im 19/32  brain]
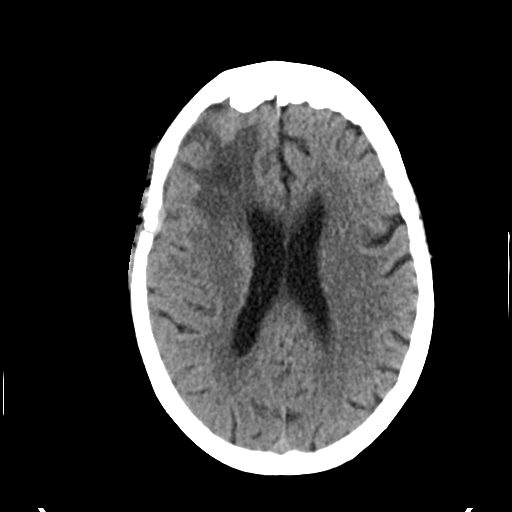
[im 21/32  brain]
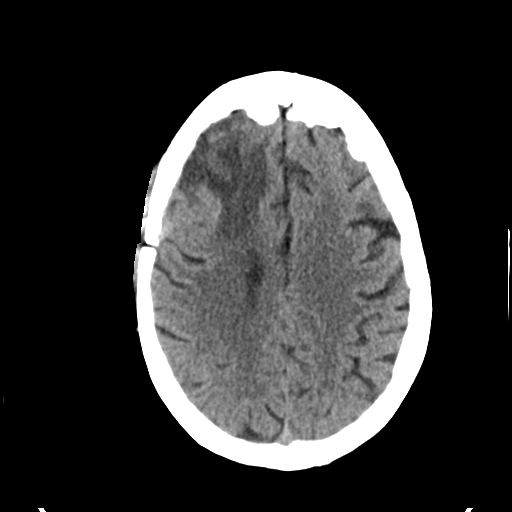
[im 23/32  brain]
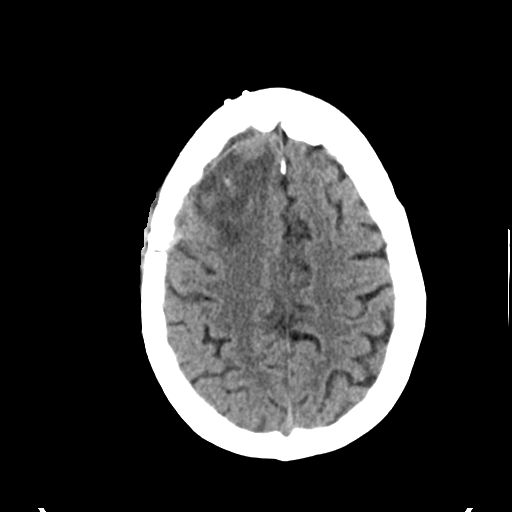
[im 24/32  brain]
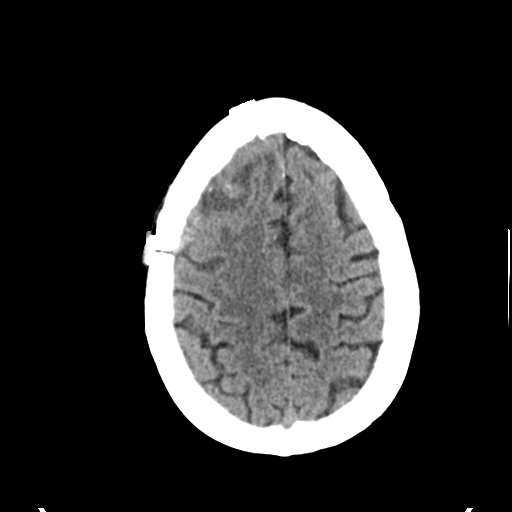
[im 24/32  bone]
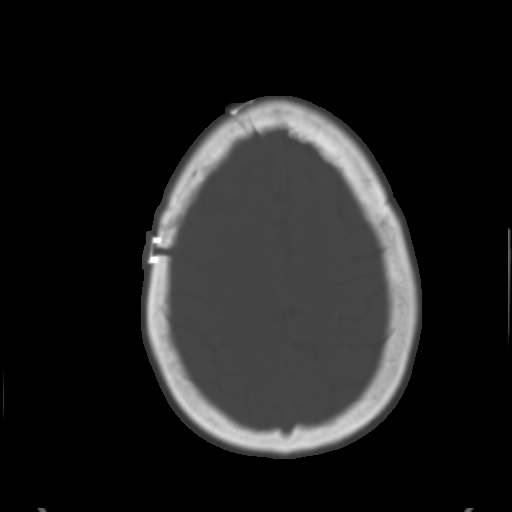
[im 26/32  brain]
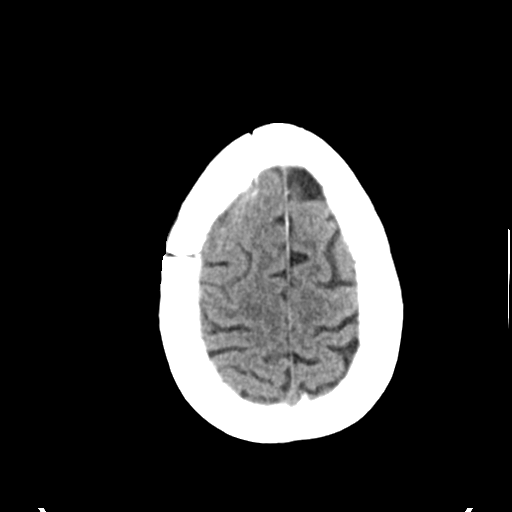
[im 28/32  brain]
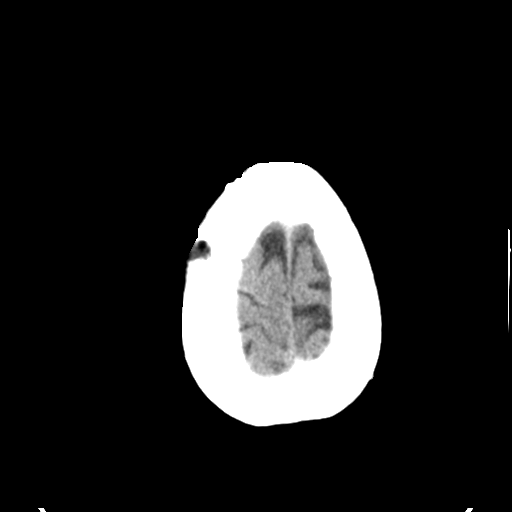
[im 30/32  brain]
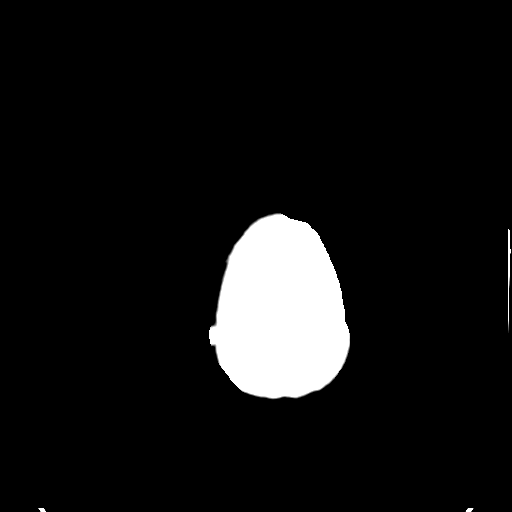

[16 of 30 positions shown; findings below may reference images not displayed]

FINDINGS: Interval right frontal craniotomy for tumor resection. Low-density
edema and encephalomalacia in the right frontal lobe from recent
surgery. Small area of hyperdensity in the right frontal cortex may
be a small amount of blood . This appears intra-axial and less
likely is residual tumor. Intravenous contrast was not administered
to evaluate for residual tumor.

Ventricle size is normal. Slight shift to the left of 3 mm. Negative
for acute infarct.
IMPRESSION: Postop resection of right frontal meningioma. Small hyperdensity in
the right frontal cortex may be postoperative resolving blood. No
significant hemorrhage or fluid collection.

Postcontrast CT or preferably MRI would be suggested to evaluate for
residual tumor if deemed clinically necessary.

## 2015-08-12 MED ORDER — LORAZEPAM 2 MG/ML IJ SOLN
1.0000 mg | Freq: Once | INTRAMUSCULAR | Status: AC
  Administered 2015-08-12: 1 mg via INTRAVENOUS

## 2015-08-12 MED ORDER — LORAZEPAM 2 MG/ML IJ SOLN
INTRAMUSCULAR | Status: AC
  Administered 2015-08-12: 1 mg via INTRAVENOUS
  Filled 2015-08-12: qty 1

## 2015-08-12 MED ORDER — ACETAMINOPHEN 325 MG PO TABS
650.0000 mg | ORAL_TABLET | Freq: Four times a day (QID) | ORAL | Status: DC | PRN
  Administered 2015-08-13 – 2015-08-14 (×3): 650 mg via ORAL
  Filled 2015-08-12 (×3): qty 2

## 2015-08-12 NOTE — Progress Notes (Signed)
PT Cancellation Note  Patient Details Name: Morgan [name] MRN: [id number] DOB: 1941-04-02   Cancelled Treatment:    Reason Eval/Treat Not Completed: Patient at procedure or test/unavailable. Pt still off of the unit.  Will check back tomorrow.   Morgan [name] 08/12/2015, 12:55 PM

## 2015-08-12 NOTE — Progress Notes (Signed)
PROGRESS NOTE  Morgan [name]  [id number] DOB: 17-Oct-1940  DOA: 08/11/2015 PCP: Gilford Rile, MD  Outpatient Specialists:  Endocrinology: Dr. Peri Jefferson in [place] Neurology: Dr. Marcial Pacas Orthopedics: Dr. French Ana  Brief Narrative:  75 y.o. 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 Prolonged post-op course and rehab she eventually returned back home in October. She also has PMH of DM 2, obesity, HTN, HLD, hypothyroid, OSA-noncompliant with CPAP, GERD, depression, COPD. As per spouse, she was improving until 3 weeks ago when she sustained 3 falls, hurt her right shoulder-seen by outpatient orthopedics and told to have ligament sprain, prescribed pain medications, scheduled for MRI of shoulder on 5/6. Spouse feels that she gets confused while she is on pain medications. On 5/3, spouse noted acute confusion after she woke up from sleep and was severely weak and was unable to get up. Some report of nausea, vomiting and decreased oral intake. Strong smelling urine but no dysuria, urinary frequency, fever or chills. Seen by her neurologist in office on 07/27/15 for confusion and memory trouble. Lamictal dose was adjusted. Admitted mostly for mental status changes, failure to thrive.   Assessment & Plan:   Principal Problem:   UTI (lower urinary tract infection) Active Problems:   Hypertension   Diabetes type 2, uncontrolled (HCC)   Acquired autoimmune hypothyroidism   Left hemiparesis (HCC)   Partial seizure (HCC)   Abnormality of gait   Delirium   Presumed UTI - Treating empirically with IV Rocephin. Unfortunately no urine culture was sent on admission.  Confusion - May be multifactorial. DD: UTI, hypoglycemia, pain medications, seizures, new stroke - CT head: No acute intracranial abnormality. Stable right frontal encephalomalacia and previous right frontal  craniotomy. 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 - MRI brain: No acute abnormalities - Follow EEG - Treat possible UTI, hypoglycemia and minimize pain medications as much as possible  Complex partial seizures/s/p Right frontal meningioma resection/encephalomalacia - Continue Lamictal.  Gait ataxia/fall risk - Secondary to same factors that are contributing to confusion - Treat underlying cause and monitor. PT and OT evaluation.  Hypothyroid - TSH mildly elevated but significantly improved compared to 05/24/15. 13.68 > 5.417. - Defer Synthroid dose adjustment to outpatient follow-up with PCP/endocrinology.  DM type II with hypoglycemia - Possibly from decreased oral intake and insulin's. Held NPH. Treating with SSI. Reasonable inpatient control. Monitor.  Nausea and vomiting - Seems to have resolved. Tolerating breakfast this morning.  Right shoulder pain - Sustained status post fall 3 weeks ago. Told to have some ligament injury. Supposed to follow-up with orthopedics after MRI on 5/6.  Depression - Continue Abilify and Cymbalta. No suicidal or homicidal ideations. Outpatient psychiatry consultation.   Essential hypertension - Continue clonidine and metoprolol. Controlled.  COPD - Stable  GERD - Continue PPI  OSA - Noncompliant with CPAP  Stage III chronic kidney disease - Creatinine probably at baseline 1.4-1.5. Follow BMP in a.m.   DVT prophylaxis: Subcutaneous heparin Code Status: Full  Family Communication: Discussed with spouse at bedside on 5/4  Disposition Plan: To be determined.? SNF    Consultants:   Neurology   Procedures:   EEG-report pending   Antimicrobials:   IV Rocephin    Subjective: Feels better. No nausea or vomiting. As per spouse, mental status almost back to baseline.   Objective:  Filed Vitals:   08/12/15 0543 08/12/15 0849 08/12/15 1003 08/12/15  1008  BP: 152/85  202/157 137/47  Pulse: 65  70 68  Temp: 98.3 F (36.8  C)  94.5 F (34.7 C)   TempSrc: Oral     Resp: 16   24  Height:      Weight:      SpO2: 99% 98% 96%     Intake/Output Summary (Last 24 hours) at 08/12/15 1434 Last data filed at 08/12/15 0130  Gross per 24 hour  Intake   1050 ml  Output    175 ml  Net    875 ml   Filed Weights   08/11/15 2128  Weight: 105 kg (231 lb 7.7 oz)    Examination:  General exam: Pleasant elderly female, moderately built and obese, sitting up comfortably in bed eating breakfast this morning. Appears calm and comfortable  Respiratory system: Clear to auscultation. Respiratory effort normal. Cardiovascular system: S1 & S2 heard, RRR.Marland Kitchen No JVD, murmurs, rubs, gallops or clicks. No pedal edema. Gastrointestinal system: Abdomen is nondistended, soft and nontender. No organomegaly or masses felt. Normal bowel sounds heard. Central nervous system: Alert and oriented. No focal neurological deficits. Extremities: Symmetric 5 x 5 power. Skin: No rashes, lesions or ulcers Psychiatry: Judgement and insight appear normal. Mood & affect appropriate.     Data Reviewed: I have personally reviewed following labs and imaging studies  CBC:  Recent Labs Lab 08/11/15 1319 08/11/15 1330  WBC 7.1  --   NEUTROABS 5.1  --   HGB 11.3* 13.3  HCT 35.9* 39.0  MCV 92.8  --   PLT 187  --    Basic Metabolic Panel:  Recent Labs Lab 08/11/15 1319 08/11/15 1330  NA 137 137  K 4.2 4.1  CL 101 98*  CO2 24  --   GLUCOSE 183* 177*  BUN 21* 25*  CREATININE 1.54* 1.50*  CALCIUM 9.3  --    GFR: Estimated Creatinine Clearance: 39.6 mL/min (by C-G formula based on Cr of 1.5). Liver Function Tests:  Recent Labs Lab 08/11/15 1319  AST 32  ALT 24  ALKPHOS 223*  BILITOT 0.8  PROT 6.6  ALBUMIN 3.6   No results for input(s): LIPASE, AMYLASE in the last 168 hours. No results for input(s): AMMONIA in the last 168 hours. Coagulation Profile:  Recent Labs Lab 08/11/15 1319  INR 1.10   Cardiac Enzymes: No  results for input(s): CKTOTAL, CKMB, CKMBINDEX, TROPONINI in the last 168 hours. BNP (last 3 results) No results for input(s): PROBNP in the last 8760 hours. HbA1C: No results for input(s): HGBA1C in the last 72 hours. CBG:  Recent Labs Lab 08/11/15 2350 08/12/15 0130 08/12/15 0309 08/12/15 0546 08/12/15 0803  GLUCAP 150* 110* 107* 114* 150*   Lipid Profile: No results for input(s): CHOL, HDL, LDLCALC, TRIG, CHOLHDL, LDLDIRECT in the last 72 hours. Thyroid Function Tests:  Recent Labs  08/11/15 2206  TSH 5.417*   Anemia Panel: No results for input(s): VITAMINB12, FOLATE, FERRITIN, TIBC, IRON, RETICCTPCT in the last 72 hours. Urine analysis:    Component Value Date/Time   COLORURINE YELLOW 08/11/2015 1835   APPEARANCEUR CLOUDY* 08/11/2015 1835   LABSPEC 1.012 08/11/2015 1835   PHURINE 6.5 08/11/2015 1835   GLUCOSEU NEGATIVE 08/11/2015 1835   HGBUR NEGATIVE 08/11/2015 1835   BILIRUBINUR NEGATIVE 08/11/2015 [place] NEGATIVE 08/11/2015 1835   PROTEINUR 30* 08/11/2015 1835   UROBILINOGEN 0.2 01/15/2015 2141   NITRITE NEGATIVE 08/11/2015 1835   LEUKOCYTESUR MODERATE* 08/11/2015 1835     Radiology Studies:  Ct Head Wo Contrast  08/11/2015  CLINICAL DATA:  Nausea with vomiting and dizziness for 3 days. Previous resection of meningioma. EXAM: CT HEAD WITHOUT CONTRAST TECHNIQUE: Contiguous axial images were obtained from the base of the skull through the vertex without intravenous contrast. COMPARISON:  MRI on 07/09/2015 and CT on 07/01/2014 FINDINGS: There is no evidence of intracranial hemorrhage, brain edema, or other signs of acute infarction. There is no evidence of intracranial mass lesion or mass effect. No abnormal extraaxial fluid collections are identified. Previous right frontal craniotomy defect again seen with associated encephalomalacia involving the right frontal lobe. Old lacunar infarct again seen involving the anterior limb of the left internal capsule.  Ventricles are stable in size. IMPRESSION: No acute intracranial abnormality. Stable right frontal encephalomalacia and previous right frontal craniotomy. 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 Electronically Signed   By: Earle Gell M.D.   On: 08/11/2015 14:32   Mr Brain Wo Contrast  08/12/2015  CLINICAL DATA:  Nausea and vomiting with dizziness for 3 days. Previous right frontal meningioma resected. EXAM: MRI HEAD WITHOUT CONTRAST TECHNIQUE: Multiplanar, multiecho pulse sequences of the brain and surrounding structures were obtained without intravenous contrast. COMPARISON:  CT head without contrast 08/11/2015. MRI brain 07/09/2015. FINDINGS: Right frontal lobe encephalomalacia following resection of a meningioma is stable. Dural thickening and a small extra-axial fluid collection are unchanged. No acute hemorrhage is present. A remote lacunar infarct is again noted in the left lentiform nucleus. Mild periventricular white matter changes are stable. White matter changes extend into the brainstem. The internal auditory canals are within normal limits bilaterally. Mild mucosal thickening is present within the anterior ethmoid air cells bilaterally. Remaining paranasal sinuses and the left mastoid air cells are clear. There is some fluid in the right mastoid air cells. IMPRESSION: 1. No acute intracranial abnormality or significant interval change. 2. Stable right frontal lobe encephalomalacia following resection of meningioma. 3. Mild age advanced atrophy and white matter disease are stable. Electronically Signed   By: San Morelle M.D.   On: 08/12/2015 13:51        Scheduled Meds: . amiodarone  200 mg Oral Daily  . ARIPiprazole  5 mg Oral QHS  . cefTRIAXone (ROCEPHIN)  IV  1 g Intravenous Q24H  . cholecalciferol  1,000 Units Oral Daily  . [START ON 08/15/2015] cloNIDine  0.3 mg Transdermal Weekly  . colchicine  0.6 mg Oral Daily  . DULoxetine  60 mg Oral BID  . heparin  5,000 Units  Subcutaneous Q8H  . insulin aspart  0-15 Units Subcutaneous TID WC  . lamoTRIgine  200 mg Oral QHS  . levothyroxine  150 mcg Oral QAC breakfast  . metoprolol  25 mg Oral BID  . mometasone-formoterol  2 puff Inhalation BID  . pantoprazole  40 mg Oral Daily  . senna  2 tablet Oral QHS   Continuous Infusions:       Time spent: 40 minutes.    [facility], MD [facility] Pager [phone]  If 7PM-7AM, please contact night-coverage [email or web address] Password [email or web address] 08/12/2015, 2:34 PM

## 2015-08-12 NOTE — Progress Notes (Signed)
Routine EEG completed, results pending. 

## 2015-08-12 NOTE — Progress Notes (Signed)
OT Cancellation Note  Patient Details Name: Morgan [name] MRN: [id number] DOB: 1941-02-17   Cancelled Treatment:    Reason Eval/Treat Not Completed: Patient at procedure or test/ unavailable   Reason Eval/Treat Not Completed: Patient at procedure or test/unavailable. Pt off the unit for procedure. Will check back as schedule permits.    Betsy Pries 08/12/2015, 12:22 PM

## 2015-08-12 NOTE — Procedures (Signed)
EEG REPORT  History: Morgan [name] is a 75 year old patient with a history of a right frontal meningioma and a history of complex partial seizures. The patient has had resection of the meningioma, currently on Lamictal for the seizures. She has been evaluated for confusion over the last several days. There is a history of depression as well.  This is a routine EEG. No skull defects are noted. Medications include Xanax, Tylenol, amiodarone, Abilify, Rosita then, vitamin D, Catapres, culture seen, Cymbalta, insulin, Lamictal, Synthroid, lorazepam, metoprolol, Dulera, Zofran, protonix, and Senokot.  EEG classification: Normal awake  Description of the recording: The background rhythms of this recording consists of a well modulated medium amplitude alpha rhythm of 9 Hz that is reactive to eye opening and closure. As the record progresses, the patient appears to remain in the waking state throughout the recording. Photic stimulation and hyperventilation were not performed. At no time during the recording does there appear to be evidence of spike or spike-wave discharges or evidence of focal slowing. EKG monitor shows no evidence of cardiac rhythm abnormalities with a heart rate of 60.  Impression: This is a normal EEG recording in the waking state. No evidence of ictal or interictal discharges were seen.

## 2015-08-12 NOTE — Progress Notes (Signed)
PT Cancellation Note  Patient Details Name: Morgan [name] MRN: [id number] DOB: Jun 04, 1940   Cancelled Treatment:    Reason Eval/Treat Not Completed: Patient at procedure or test/unavailable. Pt off the unit for procedure.  Will check back as schedule permits.   Tanika Bracco LUBECK 08/12/2015, 11:34 AM

## 2015-08-12 NOTE — Consult Note (Signed)
NEURO HOSPITALIST CONSULT NOTE   Requestig physician: Dr. Algis Liming   Reason for Consult: confusion.    HPI:                                                                                                                                          Morgan [name] is an 75 y.o. 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  Per patient she has been feeling confused for the past few days. Looking in the chart she had confusion after the surgery and per husband had been improving. She has history of falls. She has depression and is on multiple medications for this.  She states she has had significant increase in her depression since she has not been able to use her right arm secondary to pain and limited motion. She states she has increased confusion but when asked to tell more about this, she tells me she cannot cook, clean, do laundry--when asked further why?--she tells me it is NOT confusion but pain that is limiting her.  When asked if she has normal mentation and ability to do her bills and follow recipes, she states her mentation is normal. In fact, the only confusion she states was yesterday when she awoke and went the wrong direction to go to the bathroom. Once redirected she was fine. She has a very depressed affect and her recollection of everything is negative.   Past Medical History  Diagnosis Date  . Diabetes mellitus   . Obesity   . Diabetes type 2, uncontrolled ([place])   . Hypoglycemia associated with diabetes ([place])   . Hypertension   . Combined hyperlipidemia   . Acquired autoimmune hypothyroidism   . Thyroiditis, autoimmune   . Abnormal liver function tests   . Sleep apnea, obstructive   . History of gastroesophageal reflux (GERD)   . Depression   . DM neuropathy with neurologic complication ([place])   . COPD with acute  exacerbation ([place])   . Goiter   . Vertigo   . Type II diabetes mellitus with peripheral angiopathy ([place])   . Gout   . Meningioma ([place])   . Seizure ([place])   . Fracture     Left wrist  . Cancer (HCC)     Breast  . Renal insufficiency     Past Surgical History  Procedure Laterality Date  . Laparoscopic gastric banding    . Back surgery    . Craniotomy Right 05/08/2014    Procedure: CRANIOTOMY FOR MENINGIOMA;  Surgeon: Ashok Pall, MD;  Location: [facility];  Service: Neurosurgery;  Laterality: Right;  Right Craniotomy for meningioma  . Esophagogastroduodenoscopy (egd) with propofol  N/A 05/22/2014    Procedure: ESOPHAGOGASTRODUODENOSCOPY (EGD) WITH PROPOFOL;  Surgeon: Wonda Horner, MD;  Location: [facility] ENDOSCOPY;  Service: Endoscopy;  Laterality: N/A;  . Mastectomy Left   . Cholecystectomy    . Appendectomy      Family History  Problem Relation Age of Onset  . Diabetes Father   . Diabetes Sister   . Diabetes Brother   . Melanoma Mother   . CAD Father      Social History:  reports that she has never smoked. She does not have any smokeless tobacco history on file. She reports that she does not drink alcohol or use illicit drugs.  Allergies  Allergen Reactions  . Latex Rash  . Sulfa Antibiotics Other (See Comments)    Unknown allergic reaction  . Exenatide Rash  . Metformin Rash    RENAL INSUFFICIENCY  . Penicillins Rash    MEDICATIONS:                                                                                                                     Scheduled: . amiodarone  200 mg Oral Daily  . ARIPiprazole  5 mg Oral QHS  . cefTRIAXone (ROCEPHIN)  IV  1 g Intravenous Q24H  . cholecalciferol  1,000 Units Oral Daily  . [START ON 08/15/2015] cloNIDine  0.3 mg Transdermal Weekly  . colchicine  0.6 mg Oral Daily  . DULoxetine  60 mg Oral BID  . heparin  5,000 Units Subcutaneous Q8H  . insulin aspart  0-15 Units Subcutaneous TID WC  . lamoTRIgine  200 mg Oral QHS  .  levothyroxine  150 mcg Oral QAC breakfast  . LORazepam  1 mg Intravenous Once  . metoprolol  25 mg Oral BID  . mometasone-formoterol  2 puff Inhalation BID  . pantoprazole  40 mg Oral Daily  . senna  2 tablet Oral QHS   Continuous:    ROS:                                                                                                                                       History obtained from the patient  General ROS: negative for - chills, fatigue, fever, night sweats, weight gain or weight loss Psychological ROS: negative for - behavioral disorder, hallucinations, memory difficulties, mood swings or suicidal ideation Ophthalmic ROS: negative for - blurry vision, double vision, eye pain or loss of  vision ENT ROS: negative for - epistaxis, nasal discharge, oral lesions, sore throat, tinnitus or vertigo Allergy and Immunology ROS: negative for - hives or itchy/watery eyes Hematological and Lymphatic ROS: negative for - bleeding problems, bruising or swollen lymph nodes Endocrine ROS: negative for - galactorrhea, hair pattern changes, polydipsia/polyuria or temperature intolerance Respiratory ROS: negative for - cough, hemoptysis, shortness of breath or wheezing Cardiovascular ROS: negative for - chest pain, dyspnea on exertion, edema or irregular heartbeat Gastrointestinal ROS: negative for - abdominal pain, diarrhea, hematemesis, nausea/vomiting or stool incontinence Genito-Urinary ROS: negative for - dysuria, hematuria, incontinence or urinary frequency/urgency Musculoskeletal ROS: negative for - joint swelling or muscular weakness Neurological ROS: as noted in HPI Dermatological ROS: negative for rash and skin lesion changes   Blood pressure 137/47, pulse 68, temperature 94.5 F (34.7 C), temperature source Oral, resp. rate 24, height 5' 5"  (1.651 m), weight 105 kg (231 lb 7.7 oz), SpO2 96 %.   Neurologic Examination:                                                                                                       HEENT-  Normocephalic, no lesions, without obvious abnormality.  Normal external eye and conjunctiva.  Normal TM's bilaterally.  Normal auditory canals and external ears. Normal external nose, mucus membranes and septum.  Normal pharynx. Cardiovascular- S1, S2 normal, pulses palpable throughout   Lungs- chest clear, no wheezing, rales, normal symmetric air entry Abdomen- normal findings: bowel sounds normal Extremities- no edema Lymph-no adenopathy palpable Musculoskeletal-no joint tenderness, deformity or swelling Skin-warm and dry, no hyperpigmentation, vitiligo, or suspicious lesions  Neurological Examination Mental Status: Alert, oriented, thought content appropriate.  Speech fluent without evidence of aphasia.  Able to follow 3 step commands without difficulty. Able to recall 3 objects, do Seriel sevens, name 17 animals in one minute and spell WORLD backwards.  Cranial Nerves: II:  Visual fields grossly normal, pupils equal, round, reactive to light and accommodation III,IV, VI: ptosis not present, extra-ocular motions intact bilaterally V,VII: smile symmetric, facial light touch sensation normal bilaterally VIII: hearing normal bilaterally IX,X: uvula rises symmetrically XI: bilateral shoulder shrug XII: midline tongue extension Motor: Right : Upper extremity   4/5 due to pain  Left:     Upper extremity   5/5  Lower extremity   5/5     Lower extremity   5/5 Tone and bulk:normal tone throughout; no atrophy noted Sensory: Pinprick and light touch intact throughout, bilaterally Deep Tendon Reflexes: 2+ and symmetric throughout Plantars: Right: downgoing   Left: downgoing Cerebellar: normal finger-to-nose, Gait: not tested      Lab Results: Basic Metabolic Panel:  Recent Labs Lab 08/11/15 1319 08/11/15 1330  NA 137 137  K 4.2 4.1  CL 101 98*  CO2 24  --   GLUCOSE 183* 177*  BUN 21* 25*  CREATININE 1.54* 1.50*  CALCIUM 9.3  --      Liver Function Tests:  Recent Labs Lab 08/11/15 1319  AST 32  ALT 24  ALKPHOS 223*  BILITOT 0.8  PROT 6.6  ALBUMIN 3.6   No results for input(s): LIPASE, AMYLASE in the last 168 hours. No results for input(s): AMMONIA in the last 168 hours.  CBC:  Recent Labs Lab 08/11/15 1319 08/11/15 1330  WBC 7.1  --   NEUTROABS 5.1  --   HGB 11.3* 13.3  HCT 35.9* 39.0  MCV 92.8  --   PLT 187  --     Cardiac Enzymes: No results for input(s): CKTOTAL, CKMB, CKMBINDEX, TROPONINI in the last 168 hours.  Lipid Panel: No results for input(s): CHOL, TRIG, HDL, CHOLHDL, VLDL, LDLCALC in the last 168 hours.  CBG:  Recent Labs Lab 08/11/15 2350 08/12/15 0130 08/12/15 0309 08/12/15 0546 08/12/15 0803  GLUCAP 150* 110* 107* 114* 150*    Microbiology: Results for orders placed or performed during the hospital encounter of 05/29/14  Urine culture     Status: None   Collection Time: 06/02/14 11:16 PM  Result Value Ref Range Status   Specimen Description URINE, CLEAN CATCH  Final   Special Requests NONE  Final   Colony Count NO GROWTH Performed at [facility]   Final   Culture NO GROWTH Performed at [facility]   Final   Report Status 06/04/2014 FINAL  Final    Coagulation Studies:  Recent Labs  08/11/15 1319  LABPROT 14.4  INR 1.10    Imaging: Ct Head Wo Contrast  08/11/2015  CLINICAL DATA:  Nausea with vomiting and dizziness for 3 days. Previous resection of meningioma. EXAM: CT HEAD WITHOUT CONTRAST TECHNIQUE: Contiguous axial images were obtained from the base of the skull through the vertex without intravenous contrast. COMPARISON:  MRI on 07/09/2015 and CT on 07/01/2014 FINDINGS: There is no evidence of intracranial hemorrhage, brain edema, or other signs of acute infarction. There is no evidence of intracranial mass lesion or mass effect. No abnormal extraaxial fluid collections are identified. Previous right frontal craniotomy defect again  seen with associated encephalomalacia involving the right frontal lobe. Old lacunar infarct again seen involving the anterior limb of the left internal capsule. Ventricles are stable in size. IMPRESSION: No acute intracranial abnormality. Stable right frontal encephalomalacia and previous right frontal craniotomy. 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 Electronically Signed   By: Earle Gell M.D.   On: 08/11/2015 14:32     Assessment and plan per attending neurologist  Etta Quill PA-C [facility] [phone]  08/12/2015, 11:20 AM   Assessment/Plan: 75 YO female with recent increased confusion. In talking with patient she is more depressed than confused. She had no problems with MMSE. HE exam is non-focal other than right arm pain and limitation. I suspect her perceived decline in mentation is more secondary to her depression. Will order a EEG and MRI to rule out intracranial pathology, also will obtain B12, Folate, RPR, ammonia. Agree with treating underlying UTI if found and correcting her BG as it is not known how low it was at home--she arrived with BG 45.

## 2015-08-13 LAB — BASIC METABOLIC PANEL
ANION GAP: 10 (ref 5–15)
BUN: 24 mg/dL — ABNORMAL HIGH (ref 6–20)
CHLORIDE: 103 mmol/L (ref 101–111)
CO2: 26 mmol/L (ref 22–32)
Calcium: 8.9 mg/dL (ref 8.9–10.3)
Creatinine, Ser: 1.5 mg/dL — ABNORMAL HIGH (ref 0.44–1.00)
GFR calc non Af Amer: 33 mL/min — ABNORMAL LOW (ref >60)
GFR, EST AFRICAN AMERICAN: 38 mL/min — AB (ref >60)
Glucose, Bld: 168 mg/dL — ABNORMAL HIGH (ref 65–99)
POTASSIUM: 4.4 mmol/L (ref 3.5–5.1)
Sodium: 139 mmol/L (ref 135–145)

## 2015-08-13 LAB — GLUCOSE, CAPILLARY
GLUCOSE-CAPILLARY: 263 mg/dL — AB (ref 65–99)
GLUCOSE-CAPILLARY: 182 mg/dL — AB (ref 65–99)
Glucose-Capillary: 168 mg/dL — ABNORMAL HIGH (ref 65–99)

## 2015-08-13 LAB — RPR: RPR Ser Ql: NONREACTIVE

## 2015-08-13 MED ORDER — FUROSEMIDE 40 MG PO TABS
40.0000 mg | ORAL_TABLET | Freq: Every day | ORAL | Status: DC
  Administered 2015-08-14 – 2015-08-15 (×2): 40 mg via ORAL
  Filled 2015-08-13 (×2): qty 1

## 2015-08-13 MED ORDER — INSULIN NPH (HUMAN) (ISOPHANE) 100 UNIT/ML ~~LOC~~ SUSP
10.0000 [IU] | Freq: Two times a day (BID) | SUBCUTANEOUS | Status: DC
  Administered 2015-08-13 – 2015-08-15 (×4): 10 [IU] via SUBCUTANEOUS
  Filled 2015-08-13 (×2): qty 10

## 2015-08-13 NOTE — NC FL2 (Signed)
[place] LEVEL OF CARE SCREENING TOOL     IDENTIFICATION  Patient Name: RENNEE [name] Birthdate: 02/19/1941 Sex: female Admission Date (Current Location): 08/11/2015  [facility] and [place] Number:  Herbalist and Address:  [facility], [place] [address] [place], [place] [address]      Provider Number: [id number]  Attending Physician Name and Address:  Modena Jansky, MD  Relative Name and Phone Number:  [name]    Current Level of Care: Hospital Recommended Level of Care: [place] Prior Approval Number:    Date Approved/Denied:   PASRR Number: [id number]  Discharge Plan: SNF    Current Diagnoses: Patient Active Problem List   Diagnosis Date Noted  . UTI (lower urinary tract infection) 08/11/2015  . Delirium 08/11/2015  . Seizures ([place]) 04/27/2015  . Atrial flutter ([place]) 03/15/2015  . Mitral regurgitation 03/15/2015  . Acute left systolic heart failure ([place]) 01/15/2015  . Partial seizure ([place]) 07/23/2014  . Cognitive impairment 07/23/2014  . Abnormality of gait 07/23/2014  . DM neuropathy with neurologic complication ([place])   . Acute respiratory failure, unspecified whether with hypoxia or hypercapnia ([place])   . Hyponatremia   . Peripheral neuropathy ([place])   . Essential hypertension   . Gastroesophageal reflux disease without esophagitis   . Meningioma ([place])   . Acute urinary retention   . HLD (hyperlipidemia)   . Other emphysema ([place])   . Other specified hypothyroidism   . Primary gout   . Brain mass 05/03/2014  . Acute encephalopathy 05/03/2014  . Left-sided weakness 05/03/2014  . Aphasia 05/03/2014  . Abnormal head CT   . Left hemiparesis ([place])   . 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 Peripheral angiopathy due to secondary diabetes ([place]) 01/04/2012  . Diabetes type 2, uncontrolled ([place])   . Hypoglycemia associated with diabetes ([place])   . Edema of both legs   . Combined  hyperlipidemia   . Acquired autoimmune hypothyroidism   . Thyroiditis, autoimmune   . Abnormal liver function tests   . Sleep apnea, obstructive   . History of gastroesophageal reflux (GERD)   . Depression   . Type 2 diabetes mellitus with polyneuropathy ([place])   . COPD with acute exacerbation ([place])   . Goiter   . Fatigue   . Vertigo   . Type II diabetes mellitus with peripheral angiopathy ([place])   . Gout   . Pallor   . Type II or unspecified type diabetes mellitus without mention of complication, uncontrolled 08/01/2010  . Hypertension 08/01/2010  . Mixed hyperlipidemia 08/01/2010  . Obesity 08/01/2010    Orientation RESPIRATION BLADDER Height & Weight     Self, Situation, Place, Time  Normal Continent Weight: 231 lb 7.7 oz (105 kg) Height:  5\' 5"  (165.1 cm)  BEHAVIORAL SYMPTOMS/MOOD NEUROLOGICAL BOWEL NUTRITION STATUS      Continent Diet (Please See DC Summary)  AMBULATORY STATUS COMMUNICATION OF NEEDS Skin   Limited Assist Verbally Normal                       Personal Care Assistance Level of Assistance  Bathing, Feeding, Dressing Bathing Assistance: Limited assistance Feeding assistance: Independent Dressing Assistance: Limited assistance     Functional Limitations Info             SPECIAL CARE FACTORS FREQUENCY  PT (By licensed PT)  Contractures      Additional Factors Info  Code Status, Allergies, Psychotropic, Insulin Sliding Scale Code Status Info: Full Allergies Info: Latex, Sulfa Antibiotics, Exenatide, Metformin, Penicillins Psychotropic Info: Cymbalta Insulin Sliding Scale Info: insulin aspart (novoLOG) injection 0-15 Units       Current Medications (08/13/2015):  This is the current hospital active medication list Current Facility-Administered Medications  Medication Dose Route Frequency Provider Last Rate Last Dose  . acetaminophen (TYLENOL) tablet 650 mg  650 mg Oral Q6H PRN Modena Jansky, MD      .  ALPRAZolam Duanne Moron) tablet 0.5 mg  0.5 mg Oral TID PRN Etta Quill, DO      . amiodarone (PACERONE) tablet 200 mg  200 mg Oral Daily Etta Quill, DO   200 mg at 08/13/15 [id number]  . antiseptic oral rinse (BIOTENE) solution 15 mL  15 mL Mouth Rinse Daily PRN Etta Quill, DO      . ARIPiprazole (ABILIFY) tablet 5 mg  5 mg Oral QHS Etta Quill, DO   5 mg at 08/12/15 2107  . cefTRIAXone (ROCEPHIN) 1 g in dextrose 5 % 50 mL IVPB  1 g Intravenous Q24H Etta Quill, DO   1 g at 08/12/15 2000  . cholecalciferol (VITAMIN D) tablet 1,000 Units  1,000 Units Oral Daily Etta Quill, DO   1,000 Units at 08/13/15 [id number]  . [START ON 08/15/2015] cloNIDine (CATAPRES - Dosed in mg/24 hr) patch 0.3 mg  0.3 mg Transdermal Weekly Etta Quill, DO      . colchicine tablet 0.6 mg  0.6 mg Oral Daily Etta Quill, DO   0.6 mg at 08/13/15 [id number]  . DULoxetine (CYMBALTA) DR capsule 60 mg  60 mg Oral BID Etta Quill, DO   60 mg at 08/13/15 [id number]  . heparin injection 5,000 Units  5,000 Units Subcutaneous Q8H Etta Quill, DO   5,000 Units at 08/13/15 1345  . insulin aspart (novoLOG) injection 0-15 Units  0-15 Units Subcutaneous TID WC Etta Quill, DO   8 Units at 08/13/15 1345  . lamoTRIgine (LAMICTAL) tablet 200 mg  200 mg Oral QHS Etta Quill, DO   200 mg at 08/12/15 2107  . levothyroxine (SYNTHROID, LEVOTHROID) tablet 150 mcg  150 mcg Oral QAC breakfast Etta Quill, DO   150 mcg at 08/13/15 [id number]  . magnesium hydroxide (MILK OF MAGNESIA) suspension 30 mL  30 mL Oral Daily PRN Etta Quill, DO      . metoprolol tartrate (LOPRESSOR) tablet 25 mg  25 mg Oral BID Etta Quill, DO   25 mg at 08/13/15 [id number]  . mometasone-formoterol (DULERA) 100-5 MCG/ACT inhaler 2 puff  2 puff Inhalation BID Etta Quill, DO   2 puff at 08/13/15 1011  . ondansetron (ZOFRAN) injection 4 mg  4 mg Intravenous Q6H PRN Etta Quill, DO      . pantoprazole (PROTONIX) EC tablet 40 mg  40 mg Oral Daily Etta Quill, DO   40 mg at 08/13/15 [id number]  . senna (SENOKOT) tablet 17.2 mg  2 tablet Oral QHS Etta Quill, DO   17.2 mg at 08/12/15 2107     Discharge Medications: Please see discharge summary for a list of discharge medications.  Relevant Imaging Results:  Relevant Lab Results:   Additional Information SSN: [place], [place]

## 2015-08-13 NOTE — Clinical Social Work Note (Signed)
Clinical Social Work Assessment  Patient Details  Name: Morgan [name] MRN: [id number] Date of Birth: 1940/09/15  Date of referral:  08/13/15               Reason for consult:  Facility Placement                Permission sought to share information with:  Facility Sport and exercise psychologist, Family Supports Permission granted to share information::  Yes, Verbal Permission Granted  Name::     Morgan [name]  Agency::  [facility]  Relationship::  Spouse  Contact Information:  [phone]  Housing/Transportation Living arrangements for the past 2 months:  [place] of Information:  Patient, Spouse Patient Interpreter Needed:  None Criminal Activity/Legal Involvement Pertinent to Current Situation/Hospitalization:  No - Comment as needed Significant Relationships:  Adult Children, Spouse Lives with:  Spouse Do you feel safe going back to the place where you live?  No Need for family participation in patient care:  Yes (Comment)  Care giving concerns:  CSW received referral for possible SNF placement at time of discharge. CSW met with patient and patient's husband at bedside regarding PT recommendation of SNF placement at time of discharge. Per patient's husband, he is currently unable to care for patient at their home given patient's current physical needs and fall risk. Patient and patient's husband expressed understanding of PT recommendation and are agreeable to SNF placement at time of discharge. CSW to continue to follow and assist with discharge planning needs.   Social Worker assessment / plan:  CSW spoke with patient and patient's husband concerning possibility of rehab at [facility] before returning home.  Employment status:  Retired Forensic scientist:  Medicare PT Recommendations:  [place] / Referral to community resources:  [place]  Patient/Family's Response to care:  Patient and patient's husband  recognize need for rehab before returning home and are agreeable to a SNF in [place]. Patient reported preference for [facility] or [facility].  Patient/Family's Understanding of and Emotional Response to Diagnosis, Current Treatment, and Prognosis:  Patient is realistic regarding therapy needs. No questions/concerns about plan or treatment.    Emotional Assessment Appearance:  Appears stated age Attitude/Demeanor/Rapport:  Other (Appropriate) Affect (typically observed):  Accepting, Appropriate Orientation:  Oriented to Situation, Oriented to  Time, Oriented to Place, Oriented to Self Alcohol / Substance use:  Not Applicable Psych involvement (Current and /or in the community):  No (Comment)  Discharge Needs  Concerns to be addressed:  Care Coordination Readmission within the last 30 days:  No Current discharge risk:  None Barriers to Discharge:  Continued Medical Work up   [employer], [place] 08/13/2015, 5:31 PM

## 2015-08-13 NOTE — Clinical Social Work Placement (Signed)
   CLINICAL SOCIAL WORK PLACEMENT  NOTE  Date:  08/13/2015  Patient Details  Name: Morgan [name] MRN: [id number] Date of Birth: 05-22-1940  Clinical Social Work is seeking post-discharge placement for this patient at the [place] level of care (*CSW will initial, date and re-position this form in  chart as items are completed):      Patient/family provided with [place] Work Department's list of facilities offering this level of care within the geographic area requested by the patient (or if unable, by the patient's family).      Patient/family informed of their freedom to choose among providers that offer the needed level of care, that participate in Medicare, Medicaid or managed care program needed by the patient, have an available bed and are willing to accept the patient.      Patient/family informed of 's ownership interest in [facility] and [facility], as well as of the fact that they are under no obligation to receive care at these facilities.  PASRR submitted to EDS on       PASRR number received on       Existing PASRR number confirmed on 08/13/15     FL2 transmitted to all facilities in geographic area requested by pt/family on 08/13/15     FL2 transmitted to all facilities within larger geographic area on       Patient informed that his/her managed care company has contracts with or will negotiate with certain facilities, including the following:            Patient/family informed of bed offers received.  Patient chooses bed at       Physician recommends and patient chooses bed at      Patient to be transferred to   on  .  Patient to be transferred to facility by       Patient family notified on   of transfer.  Name of family member notified:        PHYSICIAN Please sign FL2     Additional Comment:    _______________________________________________ Benard Halsted, [place] 08/13/2015, 5:32 PM

## 2015-08-13 NOTE — Evaluation (Signed)
Physical Therapy Evaluation Patient Details Name: Morgan [name] MRN: [id number] DOB: [birth year] Today's Date: 08/13/2015   History of Present Illness  75 y.o. female with h/o craniotomy for meningioma resection January 2016, VDRF, seizures, DM, vertigo, depression, gout admitted with confusion and pain in R shoulder (injured shoulder in a fall 3 weeks ago, MRI pending).   Clinical Impression  Pt admitted with above diagnosis. Pt currently with functional limitations due to the deficits listed below (see PT Problem List). Pt ambulated 43' with RW and min/guard assist, distance limited by fatigue. Pt would benefit from ST-SNF as she's had 3 falls in past 3 weeks.  Pt will benefit from skilled PT to increase their independence and safety with mobility to allow discharge to the venue listed below.       Follow Up Recommendations SNF    Equipment Recommendations  None recommended by PT    Recommendations for Other Services OT consult     Precautions / Restrictions Precautions Precautions: Fall Precaution Comments: 3 falls in past 3 weeks, R shoulder painful with movement Restrictions Weight Bearing Restrictions: No      Mobility  Bed Mobility Overal bed mobility: Needs Assistance Bed Mobility: Supine to Sit     Supine to sit: Mod assist     General bed mobility comments: Mod A to raise trunk and scoot to EOB, pt sleeps in lift chair at home  Transfers Overall transfer level: Needs assistance Equipment used: Rolling walker (2 wheeled) Transfers: Sit to/from Stand Sit to Stand: Min assist         General transfer comment: min A to rise  Ambulation/Gait Ambulation/Gait assistance: Min guard Ambulation Distance (Feet): 35 Feet Assistive device: Rolling walker (2 wheeled) Gait Pattern/deviations: Step-through pattern;Decreased step length - right;Decreased step length - left   Gait velocity interpretation: Below normal speed for age/gender General Gait Details:  distance limited by fatigue, min/guard for safety, no LOB; SaO2 98% on RA  Stairs            Wheelchair Mobility    Modified Rankin (Stroke Patients Only)       Balance Overall balance assessment: History of Falls;Needs assistance   Sitting balance-Leahy Scale: Good     Standing balance support: Bilateral upper extremity supported Standing balance-Leahy Scale: Fair                               Pertinent Vitals/Pain Pain Assessment: 0-10 Pain Score: 6  Pain Location: R shoulder with movement Pain Descriptors / Indicators: Sore Pain Intervention(s): Monitored during session;Limited activity within patient's tolerance    Home Living Family/patient expects to be discharged to:: Private residence Living Arrangements: Spouse/significant other Available Help at Discharge: Family;Available 24 hours/day Type of Home: House Home Access: Ramped entrance     Home Layout: One level Home Equipment: Walker - 4 wheels;Grab bars - tub/shower;Grab bars - toilet;Bedside commode;Shower seat      Prior Function Level of Independence: Independent with assistive device(s); sleeps in lift chair        Comments: recently started using a walker, 3 falls in past 3 weeks     Hand Dominance   Dominant Hand: Right    Extremity/Trunk Assessment   Upper Extremity Assessment: RUE deficits/detail RUE Deficits / Details: grip 5/5, able to actively flex elbow fully, unable to elevate shoulder 2* pain, OT to assess further         Lower Extremity Assessment: Overall [facility]  for tasks assessed      Cervical / Trunk Assessment: Normal  Communication   Communication: No difficulties  Cognition Arousal/Alertness: Awake/alert Behavior During Therapy: WFL for tasks assessed/performed Overall Cognitive Status: Within Functional Limits for tasks assessed                      General Comments      Exercises        Assessment/Plan    PT Assessment Patient  needs continued PT services  PT Diagnosis Difficulty walking;Generalized weakness;Acute pain   PT Problem List Decreased balance;Pain;Decreased mobility;Decreased activity tolerance  PT Treatment Interventions Gait training;Functional mobility training;Therapeutic activities;Patient/family education;Balance training;Therapeutic exercise   PT Goals (Current goals can be found in the Care Plan section) Acute Rehab PT Goals Patient Stated Goal: short term rehab to get stronger PT Goal Formulation: With patient/family Time For Goal Achievement: 08/27/15 Potential to Achieve Goals: Good    Frequency Min 3X/week   Barriers to discharge        Co-evaluation               End of Session Equipment Utilized During Treatment: Gait belt Activity Tolerance: Patient limited by fatigue Patient left: in chair;with call bell/phone within reach;with family/visitor present;with chair alarm set Nurse Communication: Mobility status         Time: [id number] PT Time Calculation (min) (ACUTE ONLY): 26 min   Charges:   PT Evaluation $PT Eval Low Complexity: 1 Procedure PT Treatments $Gait Training: 8-22 mins   PT G Codes:        Philomena Doheny 08/13/2015, 11:21 AM [phone]

## 2015-08-13 NOTE — Progress Notes (Signed)
Subjective: Improved today  Exam: Filed Vitals:   08/12/15 2254 08/13/15 0604  BP: 141/56 149/62  Pulse: 65 59  Temp: 99.1 F (37.3 C) 98.2 F (36.8 C)  Resp: 18 20        Gen: In bed, NAD MS: alert and oriented CN: grossly intact Motor: MAEW other than right UE which has a RTC tear. Sensory: intact throughout   Pertinent Labs/Diagnostics: MRI and EEG shows no abnormality  Etta Quill PA-C [facility] [phone]  Impression: Suspect confusion and slowed mentation in the setting of worsening depression.    Recommendations: 1) no further recommendations. Could benefit from frequent visits to her out patient psych MD    08/13/2015, 9:44 AM

## 2015-08-13 NOTE — Evaluation (Signed)
Occupational Therapy Evaluation Patient Details Name: Morgan [name] MRN: [id number] DOB: 04/23/1940 Today's Date: 08/13/2015    History of Present Illness 75 y.o. female with h/o craniotomy for meningioma resection January 2016, VDRF, seizures, DM, vertigo, depression, gout admitted with confusion and pain in R shoulder (injured shoulder in a fall 3 weeks ago).    Clinical Impression   Pt admitted with above. She demonstrates the below listed deficits and will benefit from continued OT to maximize safety and independence with BADLs.  Pt presents to OT with Rt UE pain, impaired balance, and generalized weakness.  She currently requires mod A for ADLs.  Recommend SNF level rehab.       Follow Up Recommendations  SNF;Supervision/Assistance - 24 hour    Equipment Recommendations  None recommended by OT    Recommendations for Other Services       Precautions / Restrictions Precautions Precautions: Fall Precaution Comments: 3 falls in past 3 weeks, R shoulder painful with movement      Mobility Bed Mobility Overal bed mobility: Needs Assistance Bed Mobility: Supine to Sit;Sit to Supine     Supine to sit: Mod assist Sit to supine: Mod assist   General bed mobility comments: Pt requires assist to lift and lower trunk as well as to scoot in bed   Transfers Overall transfer level: Needs assistance Equipment used: Rolling walker (2 wheeled) Transfers: Sit to/from Stand Sit to Stand: Min assist              Balance Overall balance assessment: History of Falls   Sitting balance-Leahy Scale: Good     Standing balance support: During functional activity Standing balance-Leahy Scale: Poor                              ADL Overall ADL's : Needs assistance/impaired Eating/Feeding: Set up Eating/Feeding Details (indicate cue type and reason): uses Lt /ue Grooming: Brushing hair;Moderate assistance;Sitting   Upper Body Bathing: Moderate  assistance;Sitting   Lower Body Bathing: Moderate assistance;Sit to/from stand   Upper Body Dressing : Moderate assistance;Sitting   Lower Body Dressing: Moderate assistance;Sit to/from stand   Toilet Transfer: Minimal assistance;Ambulation;Comfort height toilet;BSC;Grab bars   Toileting- Clothing Manipulation and Hygiene: Moderate assistance;Sit to/from stand       Functional mobility during ADLs: Minimal assistance;Rolling walker General ADL Comments: Pt limited by shoulder pain and balance deficits      Vision     Perception     Praxis      Pertinent Vitals/Pain Pain Assessment: 0-10 Pain Score: 7  Pain Location: Rt shoulder  Pain Descriptors / Indicators: Aching;Constant;Sore Pain Intervention(s): Limited activity within patient's tolerance;Monitored during session;Repositioned;Premedicated before session     Hand Dominance Right   Extremity/Trunk Assessment Upper Extremity Assessment Upper Extremity Assessment: RUE deficits/detail RUE Deficits / Details: Pt with 5/5 grip.  She demonstrates elbow flex/ext WFL, but pain in bicep areas with flexion.  Shoulder 1/5.  Limited by pain.  She is able to tolerate AAROM ~30* shoulder flexion and ~25* abduction.  Pt is guarded with all movement.  SHe reports Dr French Ana injected shoulder after first fall, and that she had been scheduled to have MRI of shoulder today at his office, but had to cancel due to hospitalization.  Tightness noted throughout upper traps and levator    Lower Extremity Assessment Lower Extremity Assessment: Defer to PT evaluation   Cervical / Trunk Assessment Cervical / Trunk Assessment: Normal  Communication Communication Communication: No difficulties   Cognition Arousal/Alertness: Awake/alert Behavior During Therapy: WFL for tasks assessed/performed Overall Cognitive Status: Within Functional Limits for tasks assessed                     General Comments       Exercises Exercises:  Other exercises Other Exercises Other Exercises: Pt performed 10 reps each AAROM shoulder lfexion and abduction.  SHe was instructed how to perform self ROM to prevent frozen shoulder, but not able to perform due to severity of shoulder pain    Shoulder Instructions      Home Living Family/patient expects to be discharged to:: Skilled nursing facility Living Arrangements: Spouse/significant other Available Help at Discharge: Family;Available 24 hours/day Type of Home: House Home Access: Ramped entrance     Home Layout: One level     Bathroom Shower/Tub: Occupational psychologist: Handicapped height     Home Equipment: Walker - 4 wheels;Grab bars - tub/shower;Grab bars - toilet;Bedside commode;Shower seat          Prior Functioning/Environment Level of Independence: Needs assistance    ADL's / Homemaking Assistance Needed: Pt reports she has required extensive assist with ADLs since her recent falls and subsequent Rt UE arm pain    Comments: recently started using a walker, 3 falls in past 3 weeks    OT Diagnosis: Generalized weakness;Acute pain   OT Problem List: Decreased strength;Decreased range of motion;Decreased activity tolerance;Impaired balance (sitting and/or standing);Decreased safety awareness;Decreased knowledge of use of DME or AE;Obesity;Impaired UE functional use;Pain   OT Treatment/Interventions: Self-care/ADL training;Therapeutic exercise;DME and/or AE instruction;Manual therapy;Therapeutic activities;Patient/family education;Balance training    OT Goals(Current goals can be found in the care plan section) Acute Rehab OT Goals Patient Stated Goal: to go to rehab  OT Goal Formulation: With patient/family Time For Goal Achievement: 08/27/15 Potential to Achieve Goals: Good ADL Goals Pt Will Perform Lower Body Bathing: with min assist;with adaptive equipment;sit to/from stand Pt Will Perform Lower Body Dressing: with min assist;with adaptive  equipment;sit to/from stand Pt Will Transfer to Toilet: with supervision;ambulating;regular height toilet;bedside commode;grab bars Pt Will Perform Toileting - Clothing Manipulation and hygiene: with min assist;with adaptive equipment;sit to/from stand Pt/caregiver will Perform Home Exercise Program: Increased ROM;Right Upper extremity;With minimal assist;With written HEP provided  OT Frequency: Min 2X/week   Barriers to D/C: Decreased caregiver support          Co-evaluation              End of Session Equipment Utilized During Treatment: Rolling walker Nurse Communication: Mobility status  Activity Tolerance: Patient limited by pain Patient left: in bed;with call bell/phone within reach;with bed alarm set;with family/visitor present   Time: [id number] OT Time Calculation (min): 31 min Charges:  OT General Charges $OT Visit: 1 Procedure OT Evaluation $OT Eval Moderate Complexity: 1 Procedure OT Treatments $Therapeutic Activity: 8-22 mins G-Codes:    Blake Goya M 2015/09/04, 7:21 PM

## 2015-08-13 NOTE — Progress Notes (Signed)
PROGRESS NOTE  Morgan [name]  [id number] DOB: 1941-03-18  DOA: 08/11/2015 PCP: Gilford Rile, MD  Outpatient Specialists:  Endocrinology: Dr. Peri Jefferson in [place] Neurology: Dr. Marcial Pacas Orthopedics: Dr. French Ana  Brief Narrative:  75 y.o. 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 Prolonged post-op course and rehab she eventually returned back home in October. She also has PMH of DM 2, obesity, HTN, HLD, hypothyroid, OSA-noncompliant with CPAP, GERD, depression, COPD. As per spouse, she was improving until 3 weeks ago when she sustained 3 falls, hurt her right shoulder-seen by outpatient orthopedics and told to have ligament sprain, prescribed pain medications, scheduled for MRI of shoulder on 5/6. Spouse feels that she gets confused while she is on pain medications. On 5/3, spouse noted acute confusion after she woke up from sleep and was severely weak and was unable to get up. Some report of nausea, vomiting and decreased oral intake. Strong smelling urine but no dysuria, urinary frequency, fever or chills. Seen by her neurologist in office on 07/27/15 for confusion and memory trouble. Lamictal dose was adjusted. Admitted mostly for mental status changes, failure to thrive.   Assessment & Plan:   Principal Problem:   UTI (lower urinary tract infection) Active Problems:   Hypertension   Diabetes type 2, uncontrolled (HCC)   Acquired autoimmune hypothyroidism   Left hemiparesis (HCC)   Partial seizure (HCC)   Abnormality of gait   Delirium   Presumed UTI - Treating empirically with IV Rocephin. Unfortunately no urine culture was sent on admission. Discontinue after 3 doses.  Confusion - May be multifactorial. DD: UTI, hypoglycemia, pain medications and depression. - CT head: No acute intracranial abnormality. Stable right frontal encephalomalacia and  previous right frontal craniotomy. 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 - MRI brain: No acute abnormalities -  EEG: No seizure like activity. - Treat possible UTI, hypoglycemia and minimize pain medications as much as possible - Confusion has resolved. Do not believe that all her confusion and slowed mentation was due to worsening depression in the context that patient has significantly improved without any change in her depression medications.  Complex partial seizures/s/p Right frontal meningioma resection/encephalomalacia - Continue Lamictal. EEG without seizure activity.  Gait ataxia/fall risk - Secondary to same factors that are contributing to confusion - Treat underlying cause and monitor. PT and OT evaluation. Recommend SNF admission.  Hypothyroid - TSH mildly elevated but significantly improved compared to 05/24/15. 13.68 > 5.417. - Defer Synthroid dose adjustment to outpatient follow-up with PCP/endocrinology.  DM type II with hypoglycemia - Possibly from decreased oral intake and insulin's. Held NPH. Treating with SSI. CBG starting to rise.  Consider starting reduced dose of home insulin.  Nausea and vomiting - resolved.   Right shoulder pain - Sustained status post fall 3 weeks ago. Told to have some ligament injury. Supposed to follow-up with orthopedics after MRI on 5/6 >advised patient and spouse that this study will have to be postponed.  Depression - Continue Abilify and Cymbalta. No suicidal or homicidal ideations. Outpatient psychiatry consultation.   Essential hypertension - Continue clonidine and metoprolol. Controlled.  COPD - Stable  GERD - Continue PPI  OSA - Noncompliant with CPAP  Stage III chronic kidney disease - Creatinine probably at baseline 1.4-1.5. Stable   DVT prophylaxis: Subcutaneous heparin Code Status: Full  Family Communication: Discussed with spouse at bedside on 5/5  Disposition Plan:  DC to SNF when bed  available.   Consultants:   Neurology   Procedures:   EEG-no seizure-like activity.  Antimicrobials:   IV Rocephin    Subjective: Mental status back to baseline, per spouse at bedside. Urinary frequency without dysuria. No new complaints reported. As per RN, no acute issues.   Objective:  Filed Vitals:   08/13/15 0604 08/13/15 1011 08/13/15 1112 08/13/15 1547  BP: 149/62   144/56  Pulse: 59 65  60  Temp: 98.2 F (36.8 C)     TempSrc: Oral     Resp: 20 18  19   Height:      Weight:      SpO2: 86% 95% 98% 97%    Intake/Output Summary (Last 24 hours) at 08/13/15 1717 Last data filed at 08/13/15 1130  Gross per 24 hour  Intake      0 ml  Output   1150 ml  Net  -1150 ml   Filed Weights   08/11/15 2128  Weight: 105 kg (231 lb 7.7 oz)    Examination:  General exam: Pleasant elderly female, moderately built and obese, sitting up comfortably in bed. Appears calm and comfortable  Respiratory system: Clear to auscultation. Respiratory effort normal. Cardiovascular system: S1 & S2 heard, RRR.Marland Kitchen No JVD, murmurs, rubs, gallops or clicks. No pedal edema. Gastrointestinal system: Abdomen is nondistended, soft and nontender. No organomegaly or masses felt. Normal bowel sounds heard. Central nervous system: Alert and oriented. No focal neurological deficits. Extremities: Symmetric 5 x 5 power.Right upper extremity movements restricted secondary to painful shoulder. Skin: No rashes, lesions or ulcers Psychiatry: Judgement and insight appear normal. Mood & affect appropriate. Smiling and answers appropriately.     Data Reviewed: I have personally reviewed following labs and imaging studies  CBC:  Recent Labs Lab 08/11/15 1319 08/11/15 1330  WBC 7.1  --   NEUTROABS 5.1  --   HGB 11.3* 13.3  HCT 35.9* 39.0  MCV 92.8  --   PLT 187  --    Basic Metabolic Panel:  Recent Labs Lab 08/11/15 1319 08/11/15 1330 08/12/15 1505 08/13/15 0546  NA 137 137  --  139  K 4.2  4.1  --  4.4  CL 101 98*  --  103  CO2 24  --   --  26  GLUCOSE 183* 177*  --  168*  BUN 21* 25*  --  24*  CREATININE 1.54* 1.50*  --  1.50*  CALCIUM 9.3  --   --  8.9  MG  --   --  2.1  --   PHOS  --   --  3.5  --    GFR: Estimated Creatinine Clearance: 39.6 mL/min (by C-G formula based on Cr of 1.5). Liver Function Tests:  Recent Labs Lab 08/11/15 1319  AST 32  ALT 24  ALKPHOS 223*  BILITOT 0.8  PROT 6.6  ALBUMIN 3.6   No results for input(s): LIPASE, AMYLASE in the last 168 hours.  Recent Labs Lab 08/12/15 1505  AMMONIA 55*   Coagulation Profile:  Recent Labs Lab 08/11/15 1319  INR 1.10   Cardiac Enzymes: No results for input(s): CKTOTAL, CKMB, CKMBINDEX, TROPONINI in the last 168 hours. BNP (last 3 results) No results for input(s): PROBNP in the last 8760 hours. HbA1C: No results for input(s): HGBA1C in the last 72 hours. CBG:  Recent Labs Lab 08/12/15 1526 08/12/15 1719 08/12/15 2032 08/13/15 0810 08/13/15 1256  GLUCAP 217* 173* 158* 168* 263*  Lipid Profile: No results for input(s): CHOL, HDL, LDLCALC, TRIG, CHOLHDL, LDLDIRECT in the last 72 hours. Thyroid Function Tests:  Recent Labs  08/11/15 2206  TSH 5.417*   Anemia Panel:  Recent Labs  08/12/15 1505  VITAMINB12 304  FOLATE 39.2   Urine analysis:    Component Value Date/Time   COLORURINE YELLOW 08/11/2015 1835   APPEARANCEUR CLOUDY* 08/11/2015 1835   LABSPEC 1.012 08/11/2015 1835   PHURINE 6.5 08/11/2015 1835   GLUCOSEU NEGATIVE 08/11/2015 1835   HGBUR NEGATIVE 08/11/2015 [place] 08/11/2015 [place] 08/11/2015 1835   PROTEINUR 30* 08/11/2015 1835   UROBILINOGEN 0.2 01/15/2015 2141   NITRITE NEGATIVE 08/11/2015 1835   LEUKOCYTESUR MODERATE* 08/11/2015 1835     Radiology Studies: Mr Brain Wo Contrast  08/12/2015  CLINICAL DATA:  Nausea and vomiting with dizziness for 3 days. Previous right frontal meningioma resected. EXAM: MRI  HEAD WITHOUT CONTRAST TECHNIQUE: Multiplanar, multiecho pulse sequences of the brain and surrounding structures were obtained without intravenous contrast. COMPARISON:  CT head without contrast 08/11/2015. MRI brain 07/09/2015. FINDINGS: Right frontal lobe encephalomalacia following resection of a meningioma is stable. Dural thickening and a small extra-axial fluid collection are unchanged. No acute hemorrhage is present. A remote lacunar infarct is again noted in the left lentiform nucleus. Mild periventricular white matter changes are stable. White matter changes extend into the brainstem. The internal auditory canals are within normal limits bilaterally. Mild mucosal thickening is present within the anterior ethmoid air cells bilaterally. Remaining paranasal sinuses and the left mastoid air cells are clear. There is some fluid in the right mastoid air cells. IMPRESSION: 1. No acute intracranial abnormality or significant interval change. 2. Stable right frontal lobe encephalomalacia following resection of meningioma. 3. Mild age advanced atrophy and white matter disease are stable. Electronically Signed   By: San Morelle M.D.   On: 08/12/2015 13:51        Scheduled Meds: . amiodarone  200 mg Oral Daily  . ARIPiprazole  5 mg Oral QHS  . cefTRIAXone (ROCEPHIN)  IV  1 g Intravenous Q24H  . cholecalciferol  1,000 Units Oral Daily  . [START ON 08/15/2015] cloNIDine  0.3 mg Transdermal Weekly  . colchicine  0.6 mg Oral Daily  . DULoxetine  60 mg Oral BID  . heparin  5,000 Units Subcutaneous Q8H  . insulin aspart  0-15 Units Subcutaneous TID WC  . lamoTRIgine  200 mg Oral QHS  . levothyroxine  150 mcg Oral QAC breakfast  . metoprolol  25 mg Oral BID  . mometasone-formoterol  2 puff Inhalation BID  . pantoprazole  40 mg Oral Daily  . senna  2 tablet Oral QHS   Continuous Infusions:    LOS: 1 day    Time spent: 20 minutes.    [facility], MD [facility] Pager [phone]  [phone]  If 7PM-7AM, please contact night-coverage [email or web address] Password [email or web address] 08/13/2015, 5:17 PM

## 2015-08-14 ENCOUNTER — Other Ambulatory Visit: Payer: Medicare Other

## 2015-08-14 LAB — GLUCOSE, CAPILLARY
GLUCOSE-CAPILLARY: 243 mg/dL — AB (ref 65–99)
GLUCOSE-CAPILLARY: 187 mg/dL — AB (ref 65–99)
GLUCOSE-CAPILLARY: 192 mg/dL — AB (ref 65–99)
GLUCOSE-CAPILLARY: 181 mg/dL — AB (ref 65–99)
Glucose-Capillary: 212 mg/dL — ABNORMAL HIGH (ref 65–99)

## 2015-08-14 NOTE — Progress Notes (Addendum)
PROGRESS NOTE  Morgan [name]  JSE:[id number] DOB: February 11, 1941  DOA: 08/11/2015 PCP: Gilford Rile, MD  Outpatient Specialists:  Endocrinology: Dr. Peri Jefferson in [place] Neurology: Dr. Marcial Pacas Orthopedics: Dr. French Ana Cardiology: Dr. Kirk Ruths  Brief Narrative:  75 y.o. 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 Prolonged post-op course and rehab she eventually returned back home in October. She also has PMH of DM 2, obesity, HTN, HLD, hypothyroid, OSA-noncompliant with CPAP, GERD, depression, COPD and atrial flutter on amiodarone but not anticoagulation candidate. As per spouse, she was improving until 3 weeks ago when she sustained 3 falls, hurt her right shoulder-seen by outpatient orthopedics and told to have ligament sprain, prescribed pain medications, scheduled for MRI of shoulder on 5/6. Spouse feels that she gets confused while she is on pain medications. On 5/3, spouse noted acute confusion after she woke up from sleep and was severely weak and was unable to get up. Some report of nausea, vomiting and decreased oral intake. Strong smelling urine but no dysuria, urinary frequency, fever or chills. Seen by her neurologist in office on 07/27/15 for confusion and memory trouble. Lamictal dose was adjusted. Admitted mostly for mental status changes, failure to thrive. Improved. DC to SNF on 5/7.   Assessment & Plan:   Principal Problem:   UTI (lower urinary tract infection) Active Problems:   Hypertension   Diabetes type 2, uncontrolled (HCC)   Acquired autoimmune hypothyroidism   Left hemiparesis (HCC)   Partial seizure (HCC)   Abnormality of gait   Delirium   Presumed UTI - Treating empirically with IV Rocephin. Unfortunately no urine culture was sent on admission. Discontinue after 3 doses.  Confusion - May be multifactorial. DD: UTI, hypoglycemia,  pain medications and depression. - CT head: No acute intracranial abnormality. Stable right frontal encephalomalacia and previous right frontal craniotomy. 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 - MRI brain: No acute abnormalities -  EEG: No seizure like activity. - Treated possible UTI, hypoglycemia and minimize pain medications as much as possible - Confusion has resolved. Do not believe that all her confusion and slowed mentation was due to worsening depression in the context that patient has significantly improved without any change in her depression medications.  Complex partial seizures/s/p Right frontal meningioma resection/encephalomalacia - Continue Lamictal. EEG without seizure activity.  Gait ataxia/fall risk - Secondary to same factors that are contributing to confusion - Treat underlying cause and monitor. PT and OT evaluation. Recommend SNF admission.  Hypothyroid - TSH mildly elevated but significantly improved compared to 05/24/15. 13.68 > 5.417. - Defer Synthroid dose adjustment to outpatient follow-up with PCP/endocrinology.  DM type II with hypoglycemia - Possibly from decreased oral intake and insulin's. Held NPH. Treating with SSI. CBG starting to rise.  Started reduced dose of home insulin on 5/5.  Nausea and vomiting - resolved.   Right shoulder pain - Sustained status post fall 3 weeks ago. Told to have some ligament injury. Supposed to follow-up with orthopedics after MRI on 5/6 >advised patient and spouse that this study will have to be postponed, performed outpatient and outpatient follow-up with orthopedics. They verbalized understanding.  Depression - Continue Abilify and Cymbalta. No suicidal or homicidal ideations. Outpatient psychiatry consultation.   Essential hypertension - Continue clonidine and metoprolol. Fluctuating and mildly uncontrolled.  COPD - Stable  GERD - Continue PPI  OSA - Noncompliant with CPAP  Stage  III chronic kidney  disease - Creatinine probably at baseline 1.4-1.5. Stable  Atrial flutter - Continue amiodarone. Not anticoagulation candidate due to fall risk. Hypothyroid management per endocrinology.   DVT prophylaxis: Subcutaneous heparin Code Status: Full  Family Communication: Discussed with patient. No family at bedside. Disposition Plan: DC to SNF on 5/7.   Consultants:   Neurology   Procedures:   EEG-no seizure-like activity.  Antimicrobials:   IV Rocephin    Subjective: Seen this morning. No specific complaints reported. Seen sitting up in taking her morning pills after breakfast.  Objective:  Filed Vitals:   08/14/15 0601 08/14/15 0925 08/14/15 0948 08/14/15 1330  BP: 172/62  165/72 155/59  Pulse: 56  60 58  Temp: 97.4 F (36.3 C)   98.3 F (36.8 C)  TempSrc:      Resp: 20   20  Height:      Weight:      SpO2: 96% 97%  97%   No intake or output data in the 24 hours ending 08/14/15 1551 Filed Weights   08/11/15 2128  Weight: 105 kg (231 lb 7.7 oz)    Examination:  General exam: Pleasant elderly female, moderately built and obese, sitting up at edge of bed this morning taking her pills after she had finished eating breakfast. Appears calm and comfortable  Respiratory system: Clear to auscultation. Respiratory effort normal. Cardiovascular system: S1 & S2 heard, RRR.Marland Kitchen No JVD, murmurs, rubs, gallops or clicks. No pedal edema. Gastrointestinal system: Abdomen is nondistended, soft and nontender. No organomegaly or masses felt. Normal bowel sounds heard. Central nervous system: Alert and oriented. No focal neurological deficits. Extremities: Symmetric 5 x 5 power.Right upper extremity movements restricted secondary to painful shoulder. Skin: No rashes, lesions or ulcers Psychiatry: Judgement and insight appear normal. Mood & affect appropriate. Smiling and answers appropriately.     Data Reviewed: I have personally reviewed following labs and imaging  studies  CBC:  Recent Labs Lab 08/11/15 1319 08/11/15 1330  WBC 7.1  --   NEUTROABS 5.1  --   HGB 11.3* 13.3  HCT 35.9* 39.0  MCV 92.8  --   PLT 187  --    Basic Metabolic Panel:  Recent Labs Lab 08/11/15 1319 08/11/15 1330 08/12/15 1505 08/13/15 0546  NA 137 137  --  139  K 4.2 4.1  --  4.4  CL 101 98*  --  103  CO2 24  --   --  26  GLUCOSE 183* 177*  --  168*  BUN 21* 25*  --  24*  CREATININE 1.54* 1.50*  --  1.50*  CALCIUM 9.3  --   --  8.9  MG  --   --  2.1  --   PHOS  --   --  3.5  --    GFR: Estimated Creatinine Clearance: 39.6 mL/min (by C-G formula based on Cr of 1.5). Liver Function Tests:  Recent Labs Lab 08/11/15 1319  AST 32  ALT 24  ALKPHOS 223*  BILITOT 0.8  PROT 6.6  ALBUMIN 3.6   No results for input(s): LIPASE, AMYLASE in the last 168 hours.  Recent Labs Lab 08/12/15 1505  AMMONIA 55*   Coagulation Profile:  Recent Labs Lab 08/11/15 1319  INR 1.10   Cardiac Enzymes: No results for input(s): CKTOTAL, CKMB, CKMBINDEX, TROPONINI in the last 168 hours. BNP (last 3 results) No results for input(s): PROBNP in the last 8760 hours. HbA1C: No results for input(s): HGBA1C in the last  72 hours. CBG:  Recent Labs Lab 08/13/15 1256 08/13/15 1735 08/13/15 2058 08/14/15 0749 08/14/15 1214  GLUCAP 263* 182* 243* 187* 212*   Lipid Profile: No results for input(s): CHOL, HDL, LDLCALC, TRIG, CHOLHDL, LDLDIRECT in the last 72 hours. Thyroid Function Tests:  Recent Labs  08/11/15 2206  TSH 5.417*   Anemia Panel:  Recent Labs  08/12/15 1505  VITAMINB12 304  FOLATE 39.2   Urine analysis:    Component Value Date/Time   COLORURINE YELLOW 08/11/2015 1835   APPEARANCEUR CLOUDY* 08/11/2015 1835   LABSPEC 1.012 08/11/2015 1835   PHURINE 6.5 08/11/2015 1835   GLUCOSEU NEGATIVE 08/11/2015 1835   HGBUR NEGATIVE 08/11/2015 1835   BILIRUBINUR NEGATIVE 08/11/2015 [place] 08/11/2015 1835   PROTEINUR 30*  08/11/2015 1835   UROBILINOGEN 0.2 01/15/2015 2141   NITRITE NEGATIVE 08/11/2015 1835   LEUKOCYTESUR MODERATE* 08/11/2015 1835     Radiology Studies: No results found.      Scheduled Meds: . amiodarone  200 mg Oral Daily  . ARIPiprazole  5 mg Oral QHS  . cefTRIAXone (ROCEPHIN)  IV  1 g Intravenous Q24H  . cholecalciferol  1,000 Units Oral Daily  . [START ON 08/15/2015] cloNIDine  0.3 mg Transdermal Weekly  . colchicine  0.6 mg Oral Daily  . DULoxetine  60 mg Oral BID  . furosemide  40 mg Oral Daily  . heparin  5,000 Units Subcutaneous Q8H  . insulin aspart  0-15 Units Subcutaneous TID WC  . insulin NPH Human  10 Units Subcutaneous BID AC  . lamoTRIgine  200 mg Oral QHS  . levothyroxine  150 mcg Oral QAC breakfast  . metoprolol  25 mg Oral BID  . mometasone-formoterol  2 puff Inhalation BID  . pantoprazole  40 mg Oral Daily  . senna  2 tablet Oral QHS   Continuous Infusions:    LOS: 2 days    Time spent: 20 minutes.    [facility], MD [facility] Pager [phone]  If 7PM-7AM, please contact night-coverage [email or web address] Password [email or web address] 08/14/2015, 3:51 PM

## 2015-08-14 NOTE — Clinical Social Work Note (Signed)
Clinical Social Worker continuing to follow patient and family for support and discharge planning needs.  Patient daughter has chosen a bed at [facility].  CSW communicated with facility (Deanna) who states that they can accept patient on Sunday as long as discharge paperwork is completed in the morning - CSW updated MD and patient daughter of discharge plan.  Patient daughter plans to provide transportation.  CSW remains available for support and to facilitate patient discharge needs.  Barbette Or, LCSW (Weekend Coverage) [phone]

## 2015-08-15 LAB — GLUCOSE, CAPILLARY: Glucose-Capillary: 168 mg/dL — ABNORMAL HIGH (ref 65–99)

## 2015-08-15 MED ORDER — ALPRAZOLAM 0.5 MG PO TABS: 0.5000 mg | ORAL_TABLET | Freq: Three times a day (TID) | ORAL | Status: DC | PRN

## 2015-08-15 MED ORDER — LAMOTRIGINE 100 MG PO TABS: 200.0000 mg | ORAL_TABLET | Freq: Every day | ORAL | Status: DC

## 2015-08-15 MED ORDER — TRAMADOL HCL 50 MG PO TABS: 50.0000 mg | ORAL_TABLET | Freq: Four times a day (QID) | ORAL | Status: DC | PRN

## 2015-08-15 MED ORDER — INSULIN NPH (HUMAN) (ISOPHANE) 100 UNIT/ML ~~LOC~~ SUSP
10.0000 [IU] | Freq: Two times a day (BID) | SUBCUTANEOUS | Status: DC
Start: 2015-08-15 — End: 2016-05-20

## 2015-08-15 NOTE — Discharge Summary (Addendum)
Physician Discharge Summary  [name]  JIR:[id number]  DOB: [birth year]  DOA: 08/11/2015  PCP: Gilford Rile, MD  Outpatient Specialists: Endocrinology: Peri Jefferson, [place] [facility] Neurology: Dr. Marcial Pacas Orthopedics: Dr. French Ana Cardiology: Dr. Kirk Ruths  Admit date: 08/11/2015 Discharge date: 08/15/2015  Time spent: Greater than 30 minutes  Recommendations for Outpatient Follow-up:  1. M.D. at SNF in 3-5 days with repeat labs (CBC & BMP). 2. Dr. French Ana, Orthopedics, regarding evaluation and management of right shoulder pain. She needs to have MRI rescheduled (was supposed to have it as outpatient on 08/14/15). SNF to arrange. 3. Dr. Peri Jefferson, Endocrinology in 1-2 weeks: Follow-up regarding management of diabetes and hypothyroid. May need adjustment of medications. 4. Dr. Letha Cape, PCP upon discharge from SNF. 5. Recommend outpatient psychiatry consultation and follow-up.  Discharge Diagnoses:  Principal Problem:   UTI (lower urinary tract infection) Active Problems:   Hypertension   Diabetes type 2, uncontrolled (HCC)   Acquired autoimmune hypothyroidism   Left hemiparesis (HCC)   Partial seizure ([place])   Abnormality of gait   Delirium   Discharge Condition: Improved & Stable  Diet recommendation: Heart healthy and diabetic diet.  Filed Weights   08/11/15 2128  Weight: 105 kg (231 lb 7.7 oz)    History of present illness:  75 y.o. 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 Prolonged post-op course and rehab she eventually returned back home in October. She also has PMH of DM 2, obesity, HTN, HLD, hypothyroid, OSA-noncompliant with CPAP, GERD, depression, COPD and atrial flutter on amiodarone but not anticoagulation candidate. As per spouse, she was improving until 3 weeks ago when she sustained 3 falls, hurt her right  shoulder-seen by outpatient orthopedics and told to have ligament sprain, prescribed pain medications, scheduled for MRI of shoulder on 5/6. Spouse feels that she gets confused while she is on pain medications. On 5/3, spouse noted acute confusion after she woke up from sleep and was severely weak and was unable to get up. Some report of nausea, vomiting and decreased oral intake. Strong smelling urine but no dysuria, urinary frequency, fever or chills. Seen by her neurologist in office on 07/27/15 for confusion and memory trouble. Lamictal dose was adjusted. Admitted mostly for mental status changes, failure to thrive.  Hospital Course:   Presumed UTI - Treating empirically with IV Rocephin. Unfortunately no urine culture was sent on admission. Completed IV Rocephin course 3 doses and discontinued. Asymptomatic.  Confusion - May be multifactorial. DD: UTI, hypoglycemia, pain medications, depression and possible dementia. - CT head: No acute intracranial abnormality. Stable right frontal encephalomalacia and previous right frontal craniotomy. 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 - MRI brain: No acute abnormalities - EEG: No seizure like activity. - B12: 304, folate 39.2, RPR: Nonreactive, UDS: Negative, blood alcohol level <5. - Treated possible UTI, hypoglycemia and minimize pain medications as much as possible - Do not believe that all her confusion and slowed mentation was due to worsening depression in the context that patient has significantly improved without any change in her depression medications. - Confusion has mostly resolved. Has periods of mild confusion like she had last night and early this morning which was transient and may be related to dementia.  Complex partial seizures/s/p Right frontal meningioma resection/encephalomalacia - Continue Lamictal-recently changed by her outpatient neurologist to 200 mg at bedtime. EEG without seizure activity.  Gait ataxia/fall  risk -  Secondary to same factors that are contributing to confusion - Treat underlying cause and monitor. PT and OT evaluation. Recommend SNF admission.  Hypothyroid - TSH mildly elevated but significantly improved compared to 05/24/15. 13.68 > 5.417. - Defer Synthroid dose adjustment to outpatient follow-up with PCP/endocrinology. This was discussed at length with patient and her daughter.  DM type II with hypoglycemia - Possibly from decreased oral intake and insulin's. Temporarily held NPH. Treated with SSI. CBG starting to rise. Started reduced dose of home insulin on 5/5. Reasonable inpatient control-continue reduced dose of NPH at discharge and resume Victoza. Monitor closely and outpatient follow-up with endocrinology. - Hemoglobin A1c 01/15/15:7.8.  Nausea and vomiting - resolved.   Right shoulder pain - Sustained status post fall 3 weeks ago. Told to have some ligament injury. Supposed to follow-up with orthopedics after MRI on 5/6 >advised patient and spouse that this study will have to be postponed, performed outpatient and outpatient follow-up with orthopedics. They verbalized understanding.  Depression - Continue Abilify and Cymbalta. No suicidal or homicidal ideations. Outpatient psychiatry consultation recommended and was discussed with patient and her daughter.   Essential hypertension - Continue clonidine and metoprolol. Fluctuating and mildly uncontrolled.  COPD - Stable  GERD - Continue PPI  OSA - Noncompliant with CPAP  Stage III chronic kidney disease - Creatinine probably at baseline 1.4-1.5. Stable  Atrial flutter - Continue amiodarone. Not anticoagulation candidate due to fall risk. Hypothyroid management per endocrinology.   Consultants:   Neurology  Procedures:   EEG 08/12/15: Impression: This is a normal EEG recording in the waking state. No evidence of ictal or interictal discharges were seen.   Discharge Exam:  Complaints: Transient confusion  last night and early this morning. As per patient's daughter and RN, this has resolved and she is back to her usual. No other complaints per patient.  Filed Vitals:   08/14/15 1330 08/14/15 2223 08/15/15 0508 08/15/15 0944  BP: 155/59 177/74 158/61 136/40  Pulse: 58 58 97 66  Temp: 98.3 F (36.8 C) 97.7 F (36.5 C) 98.1 F (36.7 C)   TempSrc:  Oral Oral   Resp: 20 18 16    Height:      Weight:      SpO2: 97% 100% 96% 96%    General exam: Pleasant elderly female, moderately built and obese, sitting up comfortably in bed and has just completed eating breakfast. Respiratory system: Clear. No increased work of breathing. Cardiovascular system: S1 & S2 heard, RRR. No JVD, murmurs, gallops, clicks. Trace bilateral ankle edema. Gastrointestinal system: Abdomen is nondistended, soft and nontender. Normal bowel sounds heard. Central nervous system: Alert and oriented. No focal neurological deficits. Extremities: Symmetric 5 x 5 power. Restricted right shoulder movements from pain. Psychiatry: Pleasant affect. Answers questions appropriately.  Discharge Instructions      Discharge Instructions    (HEART FAILURE PATIENTS) Call MD:  Anytime you have any of the following symptoms: 1) 3 pound weight gain in 24 hours or 5 pounds in 1 week 2) shortness of breath, with or without a dry hacking cough 3) swelling in the hands, feet or stomach 4) if you have to sleep on extra pillows at night in order to breathe.    Complete by:  As directed      Call MD for:  difficulty breathing, headache or visual disturbances    Complete by:  As directed      Call MD for:  extreme fatigue    Complete by:  As directed      Call MD for:  persistant dizziness or light-headedness    Complete by:  As directed      Call MD for:  persistant nausea and vomiting    Complete by:  As directed      Call MD for:  severe uncontrolled pain    Complete by:  As directed      Call MD for:  temperature >100.4    Complete by:   As directed      Call MD for:    Complete by:  As directed   Recurrent or worsening confusion.     Diet - low sodium heart healthy    Complete by:  As directed      Diet Carb Modified    Complete by:  As directed      Increase activity slowly    Complete by:  As directed             Medication List    TAKE these medications        acetaminophen 325 MG tablet  Commonly known as:  TYLENOL  Take 650 mg by mouth every 6 (six) hours as needed for mild pain.     ALPRAZolam 0.5 MG tablet  Commonly known as:  XANAX  Take 1 tablet (0.5 mg total) by mouth 3 (three) times daily as needed for anxiety.     amiodarone 200 MG tablet  Commonly known as:  PACERONE  Take 1 tablet (200 mg total) by mouth daily.     antiseptic oral rinse Liqd  15 mLs by Mouth Rinse route daily as needed for dry mouth.     ARIPiprazole 5 MG tablet  Commonly known as:  ABILIFY  Take 5 mg by mouth at bedtime.     cholecalciferol 1000 units tablet  Commonly known as:  VITAMIN D  Take 1,000 Units by mouth daily.     cloNIDine 0.3 mg/24hr patch  Commonly known as:  CATAPRES - Dosed in mg/24 hr  Place 0.3 mg onto the skin once a week. Apply on Fridays     COLCRYS 0.6 MG tablet  Generic drug:  colchicine  Take 0.6 mg by mouth daily.     DULoxetine 60 MG capsule  Commonly known as:  CYMBALTA  Take 60 mg by mouth 2 (two) times daily.     furosemide 40 MG tablet  Commonly known as:  LASIX  Take 40 mg by mouth daily.     insulin NPH Human 100 UNIT/ML injection  Commonly known as:  HUMULIN N,NOVOLIN N  Inject 0.1 mLs (10 Units total) into the skin 2 (two) times daily before a meal.     lamoTRIgine 100 MG tablet  Commonly known as:  LAMICTAL  Take 2 tablets (200 mg total) by mouth at bedtime.     levothyroxine 150 MCG tablet  Commonly known as:  SYNTHROID, LEVOTHROID  Take 1 tablet (150 mcg total) by mouth daily before breakfast.     Liraglutide 18 MG/3ML Sopn  Inject 1.8 mg into the skin daily.  Victoza     magnesium hydroxide 400 MG/5ML suspension  Commonly known as:  MILK OF MAGNESIA  Take 30 mLs by mouth daily as needed for mild constipation.     metoprolol 50 MG tablet  Commonly known as:  LOPRESSOR  Take 25 mg by mouth 2 (two) times daily.     mometasone-formoterol 100-5 MCG/ACT Aero  Commonly known as:  DULERA  Inhale 2  puffs into the lungs 2 (two) times daily.     pantoprazole 40 MG tablet  Commonly known as:  PROTONIX  Take 40 mg by mouth daily.     senna 8.6 MG Tabs tablet  Commonly known as:  SENOKOT  Take 2 tablets by mouth at bedtime.     traMADol 50 MG tablet  Commonly known as:  ULTRAM  Take 1 tablet (50 mg total) by mouth every 6 (six) hours as needed for severe pain.         Get Medicines reviewed and adjusted: Please take all your medications with you for your next visit with your Primary MD  Please request your Primary MD to go over all hospital tests and procedure/radiological results at the follow up. Please ask your Primary MD to get all Hospital records sent to his/her office.  If you experience worsening of your admission symptoms, develop shortness of breath, life threatening emergency, suicidal or homicidal thoughts you must seek medical attention immediately by calling 911 or calling your MD immediately if symptoms less severe.  You must read complete instructions/literature along with all the possible adverse reactions/side effects for all the Medicines you take and that have been prescribed to you. Take any new Medicines after you have completely understood and accept all the possible adverse reactions/side effects.   Do not drive when taking pain medications.   Do not take more than prescribed Pain, Sleep and Anxiety Medications  Special Instructions: If you have smoked or chewed Tobacco in the last 2 yrs please stop smoking, stop any regular Alcohol and or any Recreational drug use.  Wear Seat belts while driving.  Please  note  You were cared for by a hospitalist during your hospital stay. Once you are discharged, your primary care physician will handle any further medical issues. Please note that NO REFILLS for any discharge medications will be authorized once you are discharged, as it is imperative that you return to your primary care physician (or establish a relationship with a primary care physician if you do not have one) for your aftercare needs so that they can reassess your need for medications and monitor your lab values.    The results of significant diagnostics from this hospitalization (including imaging, microbiology, ancillary and laboratory) are listed below for reference.    Significant Diagnostic Studies: Ct Head Wo Contrast  08/11/2015  CLINICAL DATA:  Nausea with vomiting and dizziness for 3 days. Previous resection of meningioma. EXAM: CT HEAD WITHOUT CONTRAST TECHNIQUE: Contiguous axial images were obtained from the base of the skull through the vertex without intravenous contrast. COMPARISON:  MRI on 07/09/2015 and CT on 07/01/2014 FINDINGS: There is no evidence of intracranial hemorrhage, brain edema, or other signs of acute infarction. There is no evidence of intracranial mass lesion or mass effect. No abnormal extraaxial fluid collections are identified. Previous right frontal craniotomy defect again seen with associated encephalomalacia involving the right frontal lobe. Old lacunar infarct again seen involving the anterior limb of the left internal capsule. Ventricles are stable in size. IMPRESSION: No acute intracranial abnormality. Stable right frontal encephalomalacia and previous right frontal craniotomy. 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 Electronically Signed   By: Earle Gell M.D.   On: 08/11/2015 14:32   Mr Brain Wo Contrast  08/12/2015  CLINICAL DATA:  Nausea and vomiting with dizziness for 3 days. Previous right frontal meningioma resected. EXAM: MRI HEAD WITHOUT CONTRAST  TECHNIQUE: Multiplanar, multiecho pulse sequences of the brain and surrounding structures  were obtained without intravenous contrast. COMPARISON:  CT head without contrast 08/11/2015. MRI brain 07/09/2015. FINDINGS: Right frontal lobe encephalomalacia following resection of a meningioma is stable. Dural thickening and a small extra-axial fluid collection are unchanged. No acute hemorrhage is present. A remote lacunar infarct is again noted in the left lentiform nucleus. Mild periventricular white matter changes are stable. White matter changes extend into the brainstem. The internal auditory canals are within normal limits bilaterally. Mild mucosal thickening is present within the anterior ethmoid air cells bilaterally. Remaining paranasal sinuses and the left mastoid air cells are clear. There is some fluid in the right mastoid air cells. IMPRESSION: 1. No acute intracranial abnormality or significant interval change. 2. Stable right frontal lobe encephalomalacia following resection of meningioma. 3. Mild age advanced atrophy and white matter disease are stable. Electronically Signed   By: San Morelle M.D.   On: 08/12/2015 13:51    Microbiology: No results found for this or any previous visit (from the past 240 hour(s)).   Labs: Basic Metabolic Panel:  Recent Labs Lab 08/11/15 1319 08/11/15 1330 08/12/15 1505 08/13/15 0546  NA 137 137  --  139  K 4.2 4.1  --  4.4  CL 101 98*  --  103  CO2 24  --   --  26  GLUCOSE 183* 177*  --  168*  BUN 21* 25*  --  24*  CREATININE 1.54* 1.50*  --  1.50*  CALCIUM 9.3  --   --  8.9  MG  --   --  2.1  --   PHOS  --   --  3.5  --    Liver Function Tests:  Recent Labs Lab 08/11/15 1319  AST 32  ALT 24  ALKPHOS 223*  BILITOT 0.8  PROT 6.6  ALBUMIN 3.6   No results for input(s): LIPASE, AMYLASE in the last 168 hours.  Recent Labs Lab 08/12/15 1505  AMMONIA 55*   CBC:  Recent Labs Lab 08/11/15 1319 08/11/15 1330  WBC 7.1  --    NEUTROABS 5.1  --   HGB 11.3* 13.3  HCT 35.9* 39.0  MCV 92.8  --   PLT 187  --    Cardiac Enzymes: No results for input(s): CKTOTAL, CKMB, CKMBINDEX, TROPONINI in the last 168 hours. BNP: BNP (last 3 results)  Recent Labs  01/15/15 1348  BNP 480.0*    ProBNP (last 3 results) No results for input(s): PROBNP in the last 8760 hours.  CBG:  Recent Labs Lab 08/14/15 0749 08/14/15 1214 08/14/15 1630 08/14/15 2217 08/15/15 0828  GLUCAP 187* 212* 192* 181* 168*       Signed:  Vernell Leep, MD, FACP, FHM. [facility] Pager [phone]  If 7PM-7AM, please contact night-coverage [email or web address] Password [email or web address] 08/15/2015, 10:18 AM

## 2015-08-15 NOTE — Progress Notes (Signed)
Assess pt per familly's request. Pt is alert and oriented x3. (oriented to person, time and situation, disoriented to place). Pt thought she is at home, stated "oh I am still here, I thought I had went home already". Reoriented pt, pt stated she will close her eyes and try to sleep. Will cont to monitor

## 2015-08-15 NOTE — Progress Notes (Signed)
SBAR report given to receiving nurse at [facility].

## 2015-08-15 NOTE — Clinical Social Work Placement (Signed)
   CLINICAL SOCIAL WORK PLACEMENT  NOTE  Date:  08/15/2015  Patient Details  Name: Morgan [name] MRN: [id number] Date of Birth: 04/15/1940  Clinical Social Work is seeking post-discharge placement for this patient at the [place] level of care (*CSW will initial, date and re-position this form in  chart as items are completed):  Yes   Patient/family provided with [facility] Work Department's list of facilities offering this level of care within the geographic area requested by the patient (or if unable, by the patient's family).  Yes   Patient/family informed of their freedom to choose among providers that offer the needed level of care, that participate in Medicare, Medicaid or managed care program needed by the patient, have an available bed and are willing to accept the patient.  Yes   Patient/family informed of [facility]'s ownership interest in [facility] and [facility], as well as of the fact that they are under no obligation to receive care at these facilities.  PASRR submitted to EDS on       PASRR number received on 08/14/15     Existing PASRR number confirmed on 08/13/15     FL2 transmitted to all facilities in geographic area requested by pt/family on 08/13/15     FL2 transmitted to all facilities within larger geographic area on       Patient informed that his/her managed care company has contracts with or will negotiate with certain facilities, including the following:        Yes   Patient/family informed of bed offers received.  Patient chooses bed at [place], [facility]     Physician recommends and patient chooses bed at      Patient to be transferred to [facility] on 08/15/15.  Patient to be transferred to facility by Patient daughter via car     Patient family notified on 08/15/15 of transfer.  Name of family member notified:  Patient daughter, [name] at bedside     PHYSICIAN Please sign FL2     Additional  Comment:   Barbette Or, [place]

## 2015-08-15 NOTE — Clinical Social Work Note (Signed)
Clinical Social Worker facilitated patient discharge including contacting patient family and facility to confirm patient discharge plans.  Clinical information faxed to facility and family agreeable with plan.  CSW arranged transport via patient daughter to [facility].  RN to call report prior to discharge.  Clinical Social Worker will sign off for now as social work intervention is no longer needed. Please consult [place] again if new need arises.  Barbette Or, [place]

## 2015-08-26 ENCOUNTER — Other Ambulatory Visit: Payer: Medicare Other

## 2015-08-26 ENCOUNTER — Ambulatory Visit
Admission: RE | Admit: 2015-08-26 | Discharge: 2015-08-26 | Disposition: A | Discharge status: Home / Self Care | Payer: Medicare Other | Source: Ambulatory Visit | Attending: Orthopedic Surgery | Admitting: Orthopedic Surgery

## 2015-08-26 DIAGNOSIS — M25511 Pain in right shoulder: Secondary | ICD-10-CM

## 2015-09-09 ENCOUNTER — Other Ambulatory Visit: Payer: Self-pay | Admitting: Cardiology

## 2015-09-09 NOTE — Telephone Encounter (Signed)
Rx(s) sent to pharmacy electronically.  

## 2015-11-23 ENCOUNTER — Ambulatory Visit: Payer: Medicare Other | Admitting: Neurology

## 2015-11-24 ENCOUNTER — Ambulatory Visit (INDEPENDENT_AMBULATORY_CARE_PROVIDER_SITE_OTHER): Payer: Medicare Other | Admitting: Neurology

## 2015-11-24 ENCOUNTER — Encounter: Payer: Self-pay | Admitting: Neurology

## 2015-11-24 VITALS — BP 173/75 | HR 65 | Ht 65.0 in | Wt 239.0 lb

## 2015-11-24 DIAGNOSIS — R269 Unspecified abnormalities of gait and mobility: Secondary | ICD-10-CM

## 2015-11-24 DIAGNOSIS — D329 Benign neoplasm of meninges, unspecified: Secondary | ICD-10-CM

## 2015-11-24 DIAGNOSIS — R569 Unspecified convulsions: Secondary | ICD-10-CM | POA: Diagnosis not present

## 2015-11-24 DIAGNOSIS — G8194 Hemiplegia, unspecified affecting left nondominant side: Secondary | ICD-10-CM

## 2015-11-24 DIAGNOSIS — M25511 Pain in right shoulder: Secondary | ICD-10-CM

## 2015-11-24 MED ORDER — LAMOTRIGINE ER 200 MG PO TB24: 200.0000 mg | ORAL_TABLET | Freq: Every day | ORAL | 11 refills | Status: DC

## 2015-11-24 MED ORDER — CARBIDOPA-LEVODOPA 25-100 MG PO TABS: 1.0000 | ORAL_TABLET | Freq: Three times a day (TID) | ORAL | 6 refills | Status: DC
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
## 2015-12-06 ENCOUNTER — Telehealth: Payer: Self-pay | Admitting: Neurology

## 2015-12-06 NOTE — Telephone Encounter (Signed)
Patient's daughter is calling to discuss medication carbidopa-levodopa (SINEMET IR) 25-100 MG tablet. The patient feels more weak since she has been on this medication. How long does it take for the medication to get in her system?  Please call and discuss.

## 2015-12-07 NOTE — Telephone Encounter (Signed)
Left message for a return call.  Attempted last evening and this afternoon to reach.

## 2015-12-08 NOTE — Telephone Encounter (Signed)
Pt's daughter returned RN's call

## 2015-12-08 NOTE — Telephone Encounter (Signed)
Spoke to [employer] (dgt on HIPPA) - her mother should be taking Sinemet 25-163m, one tab TID but she has missed doses and takes it at inconsistent times.  [name] is going to speak with her about the appropriate way to take the Sinemet and see if this will improve her symptoms.  If she is not feeling better, she will call [facility].

## 2015-12-15 NOTE — Progress Notes (Signed)
HPI: FU atrial flutter. Previously cared for by Dr. Doylene Canard. Admitted with atrial flutter in October 2016. Echocardiogram showed normal LV function, grade 1 diastolic dysfunction, moderate mitral regurgitation, moderate left atrial enlargement, mild right atrial enlargement and moderate tricuspid regurgitation. Nuclear study showed ejection fraction 66% and no ischemia or infarction. Patient treated with metoprolol, Cardizem and amiodarone and converted. Also placed on apixaban. TSH 6.596. At previous office visit, apixaban DCed due to previous meningioma resection and frequent falls. I have had discussions with patient and family previously concerning atrial flutter ablation. However she is chronically ill and they preferred conservative measures including continuing amiodarone. She has not been anticoagulated because of frequent falls. Since last seen, She has some dyspnea on exertion but no orthopnea, PND, pedal edema, chest pain or syncope. She has fallen 6 times since April. One resulted in shoulder fracture.  Current Outpatient Prescriptions  Medication Sig Dispense Refill  . acetaminophen (TYLENOL) 325 MG tablet Take 650 mg by mouth every 6 (six) hours as needed for mild pain.     Marland Kitchen amiodarone (PACERONE) 200 MG tablet TAKE 1 TABLET (200 MG TOTAL) BY MOUTH DAILY. 90 tablet 3  . antiseptic oral rinse (BIOTENE) LIQD 15 mLs by Mouth Rinse route daily as needed for dry mouth.    . cholecalciferol (VITAMIN D) 1000 UNITS tablet Take 1,000 Units by mouth daily.    . cloNIDine (CATAPRES - DOSED IN MG/24 HR) 0.3 mg/24hr patch Place 0.3 mg onto the skin once a week. Apply on Fridays    . colchicine (COLCRYS) 0.6 MG tablet Take 0.6 mg by mouth daily.     . DULoxetine (CYMBALTA) 60 MG capsule Take 60 mg by mouth 2 (two) times daily.     . furosemide (LASIX) 40 MG tablet Take 40 mg by mouth daily.    Marland Kitchen HYDROcodone-acetaminophen (NORCO/VICODIN) 5-325 MG tablet Take 1 tablet by mouth every 6 (six) hours  as needed for moderate pain.    Marland Kitchen insulin NPH Human (HUMULIN N,NOVOLIN N) 100 UNIT/ML injection Inject 0.1 mLs (10 Units total) into the skin 2 (two) times daily before a meal.    . LamoTRIgine XR 200 MG TB24 Take 1 tablet (200 mg total) by mouth at bedtime. 30 tablet 11  . levothyroxine (SYNTHROID, LEVOTHROID) 150 MCG tablet Take 1 tablet (150 mcg total) by mouth daily before breakfast. 30 tablet 11  . Liraglutide 18 MG/3ML SOPN Inject 1.8 mg into the skin daily. Victoza    . magnesium hydroxide (MILK OF MAGNESIA) 400 MG/5ML suspension Take 30 mLs by mouth daily as needed for mild constipation.    . metoprolol (LOPRESSOR) 50 MG tablet Take 25 mg by mouth 2 (two) times daily.  3  . mometasone-formoterol (DULERA) 100-5 MCG/ACT AERO Inhale 2 puffs into the lungs 2 (two) times daily.    . pantoprazole (PROTONIX) 40 MG tablet Take 40 mg by mouth daily.    Marland Kitchen senna (SENOKOT) 8.6 MG TABS tablet Take 2 tablets by mouth at bedtime.      No current facility-administered medications for this visit.      Past Medical History:  Diagnosis Date  . Abnormal liver function tests   . Acquired autoimmune hypothyroidism   . Basal cell carcinoma    Chest  . Cancer (HCC)    Breast  . Combined hyperlipidemia   . COPD with acute exacerbation ([place])   . Depression   . Diabetes mellitus   . Diabetes type 2, uncontrolled ([place])   .  DM neuropathy with neurologic complication ([place])   . Fracture    Left wrist  . Goiter   . Gout   . History of gastroesophageal reflux (GERD)   . Hypertension   . Hypoglycemia associated with diabetes ([place])   . Meningioma ([place])   . Obesity   . Renal insufficiency   . Seizure ([place])   . Shoulder fracture, right 07/2015  . Sleep apnea, obstructive   . Thyroiditis, autoimmune   . Type II diabetes mellitus with peripheral angiopathy ([place])   . Vertigo     Past Surgical History:  Procedure Laterality Date  . APPENDECTOMY    . BACK SURGERY    . BASAL CELL CARCINOMA EXCISION      Chest  . CHOLECYSTECTOMY    . CRANIOTOMY Right 05/08/2014   Procedure: CRANIOTOMY FOR MENINGIOMA;  Surgeon: Ashok Pall, MD;  Location: [place] [facility];  Service: Neurosurgery;  Laterality: Right;  Right Craniotomy for meningioma  . ESOPHAGOGASTRODUODENOSCOPY (EGD) WITH PROPOFOL N/A 05/22/2014   Procedure: ESOPHAGOGASTRODUODENOSCOPY (EGD) WITH PROPOFOL;  Surgeon: Wonda Horner, MD;  Location[facility] END[facility]COPY;  Service: Endoscopy;  Laterality: N/A;  . LAPAROSCOPIC GASTRIC BANDING    . MASTECTOMY Left     Social History   Social History  . Marital status: Married    Spouse name: N/A  . Number of children: 2  . Years of education: 12   Occupational History  . Retired    Social History Main Topics  . Smoking status: Never Smoker  . Smokeless tobacco: Not on file  . Alcohol use No  . Drug use: No  . Sexual activity: No   Other Topics Concern  . Not on file   Social History Narrative   Currently in nursing home for rehab.   Right-handed.   No caffeine use.    Family History  Problem Relation Age of Onset  . Diabetes Father   . Diabetes Sister   . Diabetes Brother   . Melanoma Mother   . CAD Father     ROS: no fevers or chills, productive cough, hemoptysis, dysphasia, odynophagia, melena, hematochezia, dysuria, hematuria, rash, seizure activity, orthopnea, PND, pedal edema, claudication. Remaining systems are negative.  Physical Exam: Well-developed obese in no acute distress.  Skin is warm and dry.  HEENT is normal.  Neck is supple.  Chest is clear to auscultation with normal expansion.  Cardiovascular exam is regular rate and rhythm.  Abdominal exam nontender or distended. No masses palpated. Extremities show no edema.  A/P  1 Atrial flutter-patient remains in sinus rhythm on examination. Continue amiodarone. Check TSH, chest x-ray and liver functions. She has fallen 6 times since April. One resulted in fracture of her shoulder. I therefore feel the risk of  anticoagulation outweighs the benefit. She has had multiple medical problems in the past 1-2 years. We will be conservative. She does not want to pursue atrial flutter ablation.  2 hyperlipidemia-management per primary care.  3 hypertension-blood pressure controlled. Continue present medications.  4 mitral regurgitation-moderate on most recent echocardiogram. We will plan repeat study in six months when she returns.  Kirk Ruths, MD

## 2015-12-16 ENCOUNTER — Other Ambulatory Visit: Payer: Self-pay | Admitting: *Deleted

## 2015-12-16 ENCOUNTER — Ambulatory Visit (INDEPENDENT_AMBULATORY_CARE_PROVIDER_SITE_OTHER): Payer: Medicare Other | Admitting: Cardiology

## 2015-12-16 VITALS — BP 124/70 | HR 63 | Ht 64.0 in | Wt 226.0 lb

## 2015-12-16 DIAGNOSIS — I10 Essential (primary) hypertension: Secondary | ICD-10-CM | POA: Diagnosis not present

## 2015-12-16 DIAGNOSIS — E063 Autoimmune thyroiditis: Secondary | ICD-10-CM

## 2015-12-16 DIAGNOSIS — Z79899 Other long term (current) drug therapy: Secondary | ICD-10-CM | POA: Diagnosis not present

## 2015-12-16 DIAGNOSIS — E785 Hyperlipidemia, unspecified: Secondary | ICD-10-CM | POA: Diagnosis not present

## 2015-12-16 DIAGNOSIS — I483 Typical atrial flutter: Secondary | ICD-10-CM | POA: Diagnosis not present

## 2015-12-16 LAB — COMPREHENSIVE METABOLIC PANEL
ALT: 34 U/L — ABNORMAL HIGH (ref 6–29)
AST: 57 U/L — AB (ref 10–35)
Albumin: 3.8 g/dL (ref 3.6–5.1)
Alkaline Phosphatase: 207 U/L — ABNORMAL HIGH (ref 33–130)
BILIRUBIN TOTAL: 0.6 mg/dL (ref 0.2–1.2)
BUN: 23 mg/dL (ref 7–25)
CALCIUM: 9 mg/dL (ref 8.6–10.4)
CO2: 25 mmol/L (ref 20–31)
Chloride: 100 mmol/L (ref 98–110)
Creat: 1.41 mg/dL — ABNORMAL HIGH (ref 0.60–0.93)
GLUCOSE: 256 mg/dL — AB (ref 65–99)
Potassium: 4.4 mmol/L (ref 3.5–5.3)
SODIUM: 137 mmol/L (ref 135–146)
Total Protein: 6.5 g/dL (ref 6.1–8.1)

## 2015-12-16 NOTE — Addendum Note (Signed)
Addended by: Vanessa Ralphs on: 12/16/2015 04:39 PM   Modules accepted: Orders

## 2015-12-16 NOTE — Patient Instructions (Signed)
Medication Instructions:  NO CHANGES.  Labwork: Your physician recommends that you return for lab work in: [place].  Procedure: A chest x-ray takes a picture of the organs and structures inside the chest, including the heart, lungs, and blood vessels. This test can show several things, including, whether the heart is enlarges; whether fluid is building up in the lungs; and whether pacemaker / defibrillator leads are still in place.   Follow-Up: Your physician wants you to follow-up in: [place]. You will receive a reminder letter in the mail two months in advance. If you don't receive a letter, please call our office to schedule the follow-up appointment.   If you need a refill on your cardiac medications before your next appointment, please call your pharmacy.

## 2015-12-16 NOTE — Addendum Note (Signed)
Addended by: Vanessa Ralphs on: 12/16/2015 04:34 PM   Modules accepted: Orders

## 2015-12-17 LAB — TSH: TSH: 6.13 mIU/L — ABNORMAL HIGH

## 2015-12-21 ENCOUNTER — Telehealth: Payer: Self-pay | Admitting: Cardiology

## 2015-12-21 ENCOUNTER — Other Ambulatory Visit: Payer: Self-pay | Admitting: *Deleted

## 2015-12-21 DIAGNOSIS — Z79899 Other long term (current) drug therapy: Secondary | ICD-10-CM

## 2015-12-21 NOTE — Telephone Encounter (Signed)
Returned call to patient's daughter, informed chest X Ray order on file and does not require appt. Informed of labwork and she voiced understanding to follow up w/ PCP office - pt has appt this week. No further questions at this time - she is aware to call if new concerns.

## 2015-12-21 NOTE — Telephone Encounter (Signed)
New message    Pt calling stating that an appt was suppose to be scheduled by the nurse on 9/13. Please call.

## 2015-12-21 NOTE — Telephone Encounter (Signed)
New Message  Pts daughter voiced for Hilda Blades to contact her back about lab work.  Please follow up with pts daughter.

## 2015-12-22 ENCOUNTER — Ambulatory Visit
Admission: RE | Admit: 2015-12-22 | Discharge: 2015-12-22 | Disposition: A | Discharge status: Home / Self Care | Payer: Medicare Other | Source: Ambulatory Visit | Attending: Cardiology | Admitting: Cardiology

## 2015-12-22 DIAGNOSIS — I10 Essential (primary) hypertension: Secondary | ICD-10-CM

## 2015-12-22 DIAGNOSIS — I483 Typical atrial flutter: Secondary | ICD-10-CM

## 2015-12-22 DIAGNOSIS — Z79899 Other long term (current) drug therapy: Secondary | ICD-10-CM

## 2015-12-22 DIAGNOSIS — E785 Hyperlipidemia, unspecified: Secondary | ICD-10-CM

## 2016-01-24 ENCOUNTER — Other Ambulatory Visit: Payer: Self-pay | Admitting: *Deleted

## 2016-01-24 MED ORDER — LAMOTRIGINE ER 200 MG PO TB24: 200.0000 mg | ORAL_TABLET | Freq: Every day | ORAL | 3 refills | Status: DC

## 2016-01-24 MED ORDER — CARBIDOPA-LEVODOPA 25-100 MG PO TABS: 1.0000 | ORAL_TABLET | Freq: Three times a day (TID) | ORAL | 3 refills | Status: DC

## 2016-02-08 ENCOUNTER — Other Ambulatory Visit: Payer: Self-pay | Admitting: *Deleted

## 2016-02-08 MED ORDER — CARBIDOPA-LEVODOPA 25-100 MG PO TABS: 1.0000 | ORAL_TABLET | Freq: Three times a day (TID) | ORAL | 3 refills | Status: DC

## 2016-02-08 MED ORDER — LAMOTRIGINE ER 200 MG PO TB24: 200.0000 mg | ORAL_TABLET | Freq: Every day | ORAL | 3 refills | Status: DC

## 2016-02-14 ENCOUNTER — Other Ambulatory Visit: Payer: Self-pay | Admitting: Cardiology

## 2016-02-14 MED ORDER — LEVOTHYROXINE SODIUM 150 MCG PO TABS: 150.0000 ug | ORAL_TABLET | Freq: Every day | ORAL | 3 refills | Status: DC

## 2016-02-14 MED ORDER — AMIODARONE HCL 200 MG PO TABS: ORAL_TABLET | ORAL | 3 refills | Status: DC

## 2016-02-24 ENCOUNTER — Encounter: Payer: Self-pay | Admitting: Neurology

## 2016-02-24 ENCOUNTER — Ambulatory Visit (INDEPENDENT_AMBULATORY_CARE_PROVIDER_SITE_OTHER): Payer: Medicare Other | Admitting: Neurology

## 2016-02-24 VITALS — BP 135/66 | HR 56

## 2016-02-24 DIAGNOSIS — D329 Benign neoplasm of meninges, unspecified: Secondary | ICD-10-CM | POA: Diagnosis not present

## 2016-02-24 DIAGNOSIS — G4733 Obstructive sleep apnea (adult) (pediatric): Secondary | ICD-10-CM | POA: Diagnosis not present

## 2016-02-24 DIAGNOSIS — G20 Parkinson's disease: Secondary | ICD-10-CM | POA: Diagnosis not present

## 2016-02-24 DIAGNOSIS — E1142 Type 2 diabetes mellitus with diabetic polyneuropathy: Secondary | ICD-10-CM | POA: Diagnosis not present

## 2016-02-24 DIAGNOSIS — R269 Unspecified abnormalities of gait and mobility: Secondary | ICD-10-CM

## 2016-02-24 DIAGNOSIS — R4189 Other symptoms and signs involving cognitive functions and awareness: Secondary | ICD-10-CM | POA: Diagnosis not present

## 2016-02-24 DIAGNOSIS — R569 Unspecified convulsions: Secondary | ICD-10-CM | POA: Diagnosis not present
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
## 2016-02-24 NOTE — Patient Instructions (Signed)
Parkinson Disease °Parkinson disease is a long-term (chronic) condition that gets worse over time (is progressive). Parkinson disease limits your ability to control your movements and move your body normally. This condition is a type of movement disorder. °Each person with Parkinson disease is affected differently. The condition can range from mild to severe. Parkinson disease tends to progress slowly over several years. °What are the causes? °Parkinson disease results from a loss of brain cells (neurons) in a specific part of the brain (substantia nigra). Some of the neurons in the substantia nigra make an important brain chemical (dopamine). Dopamine is needed to control movement. As the condition gets worse, neurons make less dopamine. This makes it hard to move or control your movements. °The exact cause of why neurons are lost or produce less dopamine is not known. Genetic and environmental factors may contribute to the cause of Parkinson disease. °What increases the risk? °This condition is more likely to develop in: °· Men. °· People who are 60 years of age or older. °· People who have a family history of Parkinson disease. °What are the signs or symptoms? °Symptoms of this condition can vary from person to person. The main (primary) symptoms are related to movement (motor symptoms). These include: °· Uncontrolled shaking movements (tremor). Tremors usually start in a hand or foot when you are resting (resting tremor). The tremor may stop when you move around. °· Slowing of movement. You may lose facial expression and have trouble making the small movements that are needed to button clothing or brush your teeth. You may walk with short, shuffling steps. °· Stiff movement (rigidity). This mostly affects your arms, legs, neck, and upper body. You may walk without swinging your arms. Rigidity can be painful. °· Loss of balance and stability when standing. You may sway, fall backward, and have trouble making  turns. °Secondary motor symptoms of this condition include: °· Shrinking handwriting. °· Stooped posture. °· Slowed speech. °· Trouble swallowing. °· Drooling. °· Sexual dysfunction. °· Muscle cramps. °· Loss of smell. °Additional symptoms that are not related to movement include: °· Constipation. °· Mood swings. °· Depression or anxiety. °· Sleep disturbances. °· Confusion. °· Loss of mental abilities (dementia). °· Low blood pressure. °· Trouble concentrating. °How is this diagnosed? °Parkinson disease can be hard to diagnose in its early stages. A diagnosis may be made based on symptoms, a medical history, and physical exam. During your exam, your health care provider will look for: °· Lack of facial expression. °· Resting tremor. °· Stiffness in your neck, arms, and legs. °· Abnormal walk. °· Trouble with balance. °You may have brain imaging tests done to check for a loss of dopamine-producing areas of the brain. Your healthcare provider may also grade the severity of your condition as mild, moderate, or advanced. Parkinson disease progression is different for everyone. You may not progress to the advanced stage. °Mild Parkinson disease involves: °· Movement problems that do not affect daily activities. °· Movement problems on one side of the body. °· Movement problems that are controlled with medicines. °· Good response to exercise. °Moderate Parkinson disease involves: °· Movement problems on both sides of the body. °· Slowing of movement. °· Coordination and balance problems. °· Less of a response to medicine. °· More side effects from medicines. °Advanced Parkinson disease involves: °· Extreme difficulty walking. °· Inability to live alone safely. °· Signs of dementia. °· Difficulty controlling symptoms with medicine. °How is this treated? °There is no cure for Parkinson   disease. Treatment focuses on relieving your symptoms. Treatment may include: °· Medicines. °· Speech, occupational, and physical  therapy. °· Surgery. °Everyone responds to medicines differently. Your response may change over time. Work with your health care provider to find the best medicines for you. These may include: °· Dopamine replacement drugs. These are the most effective medicines. A long-term side effect of these medicines is uncontrolled movements (dyskinesias). °· Dopamine agonists. These drugs act like dopamine to stimulate dopamine receptors in the brain. Side effects include nausea and sleepiness, but they cause less dyskinesia. °· Other medicines to reduce tremor, prevent dopamine breakdown, reduce dyskinesia, and reduce dementia that is related to Parkinson disease. °Another treatment is deep brain stimulation surgery to reduce tremors and dyskinesia. This procedure involves placing electrodes in the brain. The electrodes are attached to an electric pulse generator that acts like a pacemaker for your brain. This may be an option if you have had the condition for at least four years and are not responding well to medicines. °Follow these instructions at home: °· Take over-the-counter and prescription medicines only as told by your health care provider. °· Install grab bars and railings in your home to prevent falls. °· Follow instructions from your health care provider about eating or drinking restrictions. °· Return to your normal activities as told by your health care provider. Ask your health care provider what activities are safe for you. °· Get regular exercise as told by your health care provider or a physical therapist. °· Keep all follow-up visits as told by your health care provider. This is important. These include any visits with a speech therapist or occupational therapist. °· Consider joining a support group for people with Parkinson disease. °Contact a health care provider if: °· Medicines do not help your symptoms. °· You are unsteady or have fallen at home. °· You need more support to function well at  home. °· You have trouble swallowing. °· You have severe constipation. °· You are struggling with side effects from your medicines. °· You see or hear things that are not real (hallucinate). °· You feel confused, anxious, or depressed. °Get help right away if: °· You are injured after a fall. °· You cannot swallow without choking. °· You have chest pain or trouble breathing. °· You do not feel safe at home. °This information is not intended to replace advice given to you by your health care provider. Make sure you discuss any questions you have with your health care provider. °Document Released: 03/24/2000 Document Revised: 08/30/2015 Document Reviewed: 01/15/2015 °Elsevier Interactive Patient Education © 2017 [employer] ° °

## 2016-02-28 ENCOUNTER — Telehealth: Payer: Self-pay | Admitting: Neurology

## 2016-02-28 NOTE — Telephone Encounter (Signed)
Daughter Adele Barthel called to advise, she was unable to be with Mom at her last visit November 16th, would like to speak to nurse to go over that visit.

## 2016-02-28 NOTE — Telephone Encounter (Signed)
Returned call to [employer] (dgt on HIPAA) - reviewed last office visit with her.

## 2016-04-04 ENCOUNTER — Encounter: Payer: Self-pay | Admitting: *Deleted

## 2016-04-04 ENCOUNTER — Other Ambulatory Visit: Payer: Self-pay | Admitting: *Deleted

## 2016-04-04 DIAGNOSIS — R945 Abnormal results of liver function studies: Principal | ICD-10-CM

## 2016-04-04 DIAGNOSIS — R7989 Other specified abnormal findings of blood chemistry: Secondary | ICD-10-CM

## 2016-04-12 ENCOUNTER — Telehealth: Payer: Self-pay | Admitting: Cardiology

## 2016-04-12 NOTE — Telephone Encounter (Signed)
Spoke with dr Willette Pa office and lab results will be faxed to [place].

## 2016-04-12 NOTE — Telephone Encounter (Signed)
Morgan [name] is calling to let you know that she had lab work done 2 weeks agao at Dr. Gilford Rile office in [place] . His office ph# is [phone]. Please call if you have any questions . Thanks

## 2016-04-14 ENCOUNTER — Telehealth: Payer: Self-pay | Admitting: *Deleted

## 2016-04-14 DIAGNOSIS — R7989 Other specified abnormal findings of blood chemistry: Secondary | ICD-10-CM

## 2016-04-14 DIAGNOSIS — R945 Abnormal results of liver function studies: Principal | ICD-10-CM

## 2016-04-14 NOTE — Telephone Encounter (Signed)
Labs from dr Willette Pa offcie reviewed by dr Stanford Breed. Spoke with pt, she will have liver function checked again in 12 weeks. Lab orders mailed to the pt

## 2016-04-19 ENCOUNTER — Encounter: Payer: Self-pay | Admitting: Cardiology

## 2016-05-10 ENCOUNTER — Other Ambulatory Visit: Payer: Self-pay | Admitting: Neurology

## 2016-05-20 ENCOUNTER — Emergency Department (HOSPITAL_COMMUNITY): Payer: Medicare Other

## 2016-05-20 ENCOUNTER — Emergency Department (HOSPITAL_COMMUNITY)
Admission: EM | Admit: 2016-05-20 | Discharge: 2016-05-21 | Disposition: A | Discharge status: Home / Self Care | Payer: Medicare Other | Source: Home / Self Care | Attending: Emergency Medicine | Admitting: Emergency Medicine

## 2016-05-20 ENCOUNTER — Encounter (HOSPITAL_COMMUNITY): Payer: Self-pay | Admitting: Nurse Practitioner

## 2016-05-20 DIAGNOSIS — E1142 Type 2 diabetes mellitus with diabetic polyneuropathy: Secondary | ICD-10-CM | POA: Insufficient documentation

## 2016-05-20 DIAGNOSIS — R748 Abnormal levels of other serum enzymes: Secondary | ICD-10-CM | POA: Insufficient documentation

## 2016-05-20 DIAGNOSIS — E162 Hypoglycemia, unspecified: Secondary | ICD-10-CM | POA: Diagnosis not present

## 2016-05-20 DIAGNOSIS — R946 Abnormal results of thyroid function studies: Secondary | ICD-10-CM

## 2016-05-20 DIAGNOSIS — R7989 Other specified abnormal findings of blood chemistry: Secondary | ICD-10-CM

## 2016-05-20 DIAGNOSIS — I5021 Acute systolic (congestive) heart failure: Secondary | ICD-10-CM

## 2016-05-20 DIAGNOSIS — J449 Chronic obstructive pulmonary disease, unspecified: Secondary | ICD-10-CM | POA: Insufficient documentation

## 2016-05-20 DIAGNOSIS — Z853 Personal history of malignant neoplasm of breast: Secondary | ICD-10-CM | POA: Insufficient documentation

## 2016-05-20 DIAGNOSIS — E1140 Type 2 diabetes mellitus with diabetic neuropathy, unspecified: Secondary | ICD-10-CM

## 2016-05-20 DIAGNOSIS — R5381 Other malaise: Secondary | ICD-10-CM

## 2016-05-20 DIAGNOSIS — G20 Parkinson's disease: Secondary | ICD-10-CM

## 2016-05-20 DIAGNOSIS — Z794 Long term (current) use of insulin: Secondary | ICD-10-CM

## 2016-05-20 DIAGNOSIS — I110 Hypertensive heart disease with heart failure: Secondary | ICD-10-CM | POA: Insufficient documentation

## 2016-05-20 DIAGNOSIS — Z91040 Latex allergy status: Secondary | ICD-10-CM | POA: Insufficient documentation

## 2016-05-20 DIAGNOSIS — E11649 Type 2 diabetes mellitus with hypoglycemia without coma: Secondary | ICD-10-CM | POA: Diagnosis not present

## 2016-05-20 LAB — URINALYSIS, ROUTINE W REFLEX MICROSCOPIC
Bacteria, UA: NONE SEEN
Bilirubin Urine: NEGATIVE
GLUCOSE, UA: NEGATIVE mg/dL
Hgb urine dipstick: NEGATIVE
KETONES UR: NEGATIVE mg/dL
LEUKOCYTES UA: NEGATIVE
Nitrite: NEGATIVE
Protein, ur: 30 mg/dL — AB
SPECIFIC GRAVITY, URINE: 1.01 (ref 1.005–1.030)
pH: 6 (ref 5.0–8.0)

## 2016-05-20 LAB — BASIC METABOLIC PANEL
Anion gap: 13 (ref 5–15)
BUN: 30 mg/dL — AB (ref 6–20)
CHLORIDE: 97 mmol/L — AB (ref 101–111)
CO2: 25 mmol/L (ref 22–32)
CREATININE: 2.01 mg/dL — AB (ref 0.44–1.00)
Calcium: 9.3 mg/dL (ref 8.9–10.3)
GFR calc Af Amer: 27 mL/min — ABNORMAL LOW (ref >60)
GFR calc non Af Amer: 23 mL/min — ABNORMAL LOW (ref >60)
GLUCOSE: 106 mg/dL — AB (ref 65–99)
Potassium: 5.6 mmol/L — ABNORMAL HIGH (ref 3.5–5.1)
SODIUM: 135 mmol/L (ref 135–145)

## 2016-05-20 LAB — HEPATIC FUNCTION PANEL
ALT: 19 U/L (ref 14–54)
AST: 53 U/L — ABNORMAL HIGH (ref 15–41)
Albumin: 3.5 g/dL (ref 3.5–5.0)
Alkaline Phosphatase: 168 U/L — ABNORMAL HIGH (ref 38–126)
BILIRUBIN DIRECT: 0.2 mg/dL (ref 0.1–0.5)
BILIRUBIN INDIRECT: 0.8 mg/dL (ref 0.3–0.9)
TOTAL PROTEIN: 6.5 g/dL (ref 6.5–8.1)
Total Bilirubin: 1 mg/dL (ref 0.3–1.2)

## 2016-05-20 LAB — I-STAT CHEM 8, ED
BUN: 33 mg/dL — AB (ref 6–20)
CALCIUM ION: 1.09 mmol/L — AB (ref 1.15–1.40)
CHLORIDE: 99 mmol/L — AB (ref 101–111)
Creatinine, Ser: 1.8 mg/dL — ABNORMAL HIGH (ref 0.44–1.00)
Glucose, Bld: 108 mg/dL — ABNORMAL HIGH (ref 65–99)
HEMATOCRIT: 35 % — AB (ref 36.0–46.0)
Hemoglobin: 11.9 g/dL — ABNORMAL LOW (ref 12.0–15.0)
Potassium: 4 mmol/L (ref 3.5–5.1)
SODIUM: 139 mmol/L (ref 135–145)
TCO2: 28 mmol/L (ref 0–100)

## 2016-05-20 LAB — CBC
HCT: 38.1 % (ref 36.0–46.0)
Hemoglobin: 12.3 g/dL (ref 12.0–15.0)
MCH: 29.3 pg (ref 26.0–34.0)
MCHC: 32.3 g/dL (ref 30.0–36.0)
MCV: 90.7 fL (ref 78.0–100.0)
PLATELETS: 148 10*3/uL — AB (ref 150–400)
RBC: 4.2 MIL/uL (ref 3.87–5.11)
RDW: 15.7 % — AB (ref 11.5–15.5)
WBC: 7.5 10*3/uL (ref 4.0–10.5)

## 2016-05-20 LAB — I-STAT VENOUS BLOOD GAS, ED
Acid-Base Excess: 7 mmol/L — ABNORMAL HIGH (ref 0.0–2.0)
Bicarbonate: 32.1 mmol/L — ABNORMAL HIGH (ref 20.0–28.0)
O2 Saturation: 95 %
PCO2 VEN: 48.4 mmHg (ref 44.0–60.0)
PO2 VEN: 75 mmHg — AB (ref 32.0–45.0)
TCO2: 34 mmol/L (ref 0–100)
pH, Ven: 7.43 (ref 7.250–7.430)

## 2016-05-20 LAB — BRAIN NATRIURETIC PEPTIDE: B NATRIURETIC PEPTIDE 5: 57.3 pg/mL (ref 0.0–100.0)

## 2016-05-20 LAB — I-STAT TROPONIN, ED: Troponin i, poc: 0.02 ng/mL (ref 0.00–0.08)

## 2016-05-20 LAB — INFLUENZA PANEL BY PCR (TYPE A & B)
INFLBPCR: NEGATIVE
Influenza A By PCR: NEGATIVE

## 2016-05-20 LAB — TSH: TSH: 0.25 u[IU]/mL — AB (ref 0.350–4.500)

## 2016-05-20 LAB — AMMONIA: Ammonia: 20 umol/L (ref 9–35)

## 2016-05-20 MED ORDER — LEVOTHYROXINE SODIUM 137 MCG PO TABS: 150.0000 ug | ORAL_TABLET | Freq: Every day | ORAL | 0 refills | Status: DC

## 2016-05-20 MED ORDER — SODIUM CHLORIDE 0.9 % IV BOLUS (SEPSIS)
1000.0000 mL | Freq: Once | INTRAVENOUS | Status: AC
  Administered 2016-05-20: 1000 mL via INTRAVENOUS

## 2016-05-20 MED ORDER — LEVOTHYROXINE SODIUM 112 MCG PO TABS: 150.0000 ug | ORAL_TABLET | Freq: Every day | ORAL | 0 refills | Status: DC

## 2016-05-20 NOTE — ED Notes (Addendum)
Patient transported to CT 

## 2016-05-20 NOTE — ED Notes (Signed)
Per pt's husband, the pt ambulated better here in the hospital then she did at home. Pt was a bit wobbly on her feet initially but once moving she felt fine. Pt did say it felt different walking on her right foot but she had a cast on it up until 2 weeks prior.

## 2016-05-20 NOTE — ED Notes (Addendum)
Pt returned from CT °

## 2016-05-20 NOTE — ED Provider Notes (Signed)
[facility] DEPT Provider Note   CSN: [id number] Arrival date & time: 05/20/16  1628     History   Chief Complaint Chief Complaint  Patient presents with  . Weakness    HPI Morgan [name] is a 76 y.o. female.  HPI Pt presents with c/o weakness. The weakness began earlier in the week. She was seen at [facility], diagnosed with UTI, and discharged home on oral abx. She has been taking the abx as instructed but reports the weakness is getting worse and her family says that now she is starting to seem confused at times. Per husband, the confusion is actually ongoing for several months. She was told she had mild dementia in the past and he believes that this may be contributing, although he does state that she's been a little bit more tired than usual and that he has had to assist her in many ADLs.  She reports anorexia, nausea, vomiting, constipation.  She denies any fevers, chills, dysuria, hematuria, urinary frequency.   Patient does have a history of meningioma and has been states that she has been having a lot of shaking. 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 She is also on tramadol when necessary but has not taken. She also has a diagnosis of seizures but has not had one recently per husband.  No recent falls, no fevers or chills. Does have a mild cough that started while in the emergency department but none prior.  She denies any dysuria at this time or abdominal pain. No diarrhea.  Past Medical History:  Diagnosis Date  . Abnormal liver function tests   . Acquired autoimmune hypothyroidism   . Basal cell carcinoma    Chest  . Cancer (HCC)    Breast  . Combined hyperlipidemia   . COPD with acute exacerbation ([place])   . Depression   . Diabetes mellitus   . Diabetes type 2, uncontrolled ([facility])   . 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 Fracture    Left wrist  . Goiter    . Gout   . History of gastroesophageal reflux (GERD)   . Hypertension   . Hypoglycemia associated with diabetes ([place])   . Meningioma ([place])   . Obesity   . Renal insufficiency   . Seizure ([place])   . Shoulder fracture, right 07/2015  . Sleep apnea, obstructive   . Thyroiditis, autoimmune   . Type II diabetes mellitus with peripheral angiopathy ([place])   . Vertigo     Patient Active Problem List   Diagnosis Date Noted  . Parkinsonism ([place]) 02/24/2016  . UTI (lower urinary tract infection) 08/11/2015  . Delirium 08/11/2015  . Seizures ([place]) 04/27/2015  . Atrial flutter ([place]) 03/15/2015  . Mitral regurgitation 03/15/2015  . Acute left systolic heart failure ([place]) 01/15/2015  . Partial seizure ([place]) 07/23/2014  . Cognitive impairment 07/23/2014  . Abnormality of gait 07/23/2014  . DM neuropathy with neurologic complication ([place])   . Acute respiratory failure, unspecified whether with hypoxia or hypercapnia ([place])   . Hyponatremia   . Peripheral neuropathy ([place])   . Essential hypertension   . Gastroesophageal reflux disease without esophagitis   . Meningioma ([place])   . Acute urinary retention   . HLD (hyperlipidemia)   . Other emphysema ([place])   . Other specified hypothyroidism   . Primary gout   . Brain mass 05/03/2014  . Acute encephalopathy 05/03/2014  .  Left-sided weakness 05/03/2014  . Aphasia 05/03/2014  . Abnormal head CT   . Left hemiparesis ([place])   . 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 Peripheral angiopathy due to secondary diabetes ([place]) 01/04/2012  . Diabetes type 2, uncontrolled ([place])   . Hypoglycemia associated with diabetes ([place])   . Edema of both legs   . Combined hyperlipidemia   . Acquired autoimmune hypothyroidism   . Thyroiditis, autoimmune   . Abnormal liver function tests   . Sleep apnea, obstructive   . History of gastroesophageal reflux (GERD)   . Depression   . Type 2 diabetes mellitus with polyneuropathy ([place])   . COPD with acute  exacerbation ([place])   . Goiter   . Fatigue   . Vertigo   . Type II diabetes mellitus with peripheral angiopathy ([place])   . Gout   . Pallor   . Type II or unspecified type diabetes mellitus without mention of complication, uncontrolled 08/01/2010  . Hypertension 08/01/2010  . Mixed hyperlipidemia 08/01/2010  . Obesity 08/01/2010    Past Surgical History:  Procedure Laterality Date  . APPENDECTOMY    . BACK SURGERY    . BASAL CELL CARCINOMA EXCISION     Chest  . CHOLECYSTECTOMY    . CRANIOTOMY Right 05/08/2014   Procedure: CRANIOTOMY FOR MENINGIOMA;  Surgeon: Ashok Pall, MD;  Location: [facility] NEURO ORS;  Service: Neurosurgery;  Laterality: Right;  Right Craniotomy for meningioma  . ESOPHAGOGASTRODUODENOSCOPY (EGD) WITH PROPOFOL N/A 05/22/2014   Procedure: ESOPHAGOGASTRODUODENOSCOPY (EGD) WITH PROPOFOL;  Surgeon: Wonda Horner, MD;  Location: [facility] ENDOSCOPY;  Service: Endoscopy;  Laterality: N/A;  . LAPAROSCOPIC GASTRIC BANDING    . MASTECTOMY Left     OB History    No data available       Home Medications    Prior to Admission medications   Medication Sig Start Date End Date Taking? Authorizing Provider  acetaminophen (TYLENOL) 325 MG tablet Take 650 mg by mouth every 6 (six) hours as needed for mild pain.    Yes Historical Provider, MD  amiodarone (PACERONE) 200 MG tablet TAKE 1 TABLET (200 MG TOTAL) BY MOUTH DAILY. Patient taking differently: Take 100 mg by mouth daily. Takes 1/2 tab 02/14/16  Yes [name], MD  antiseptic oral rinse (BIOTENE) LIQD 15 mLs by Mouth Rinse route at bedtime.    Yes Historical Provider, MD  ARIPiprazole (ABILIFY) 5 MG tablet Take 5 mg by mouth every evening.   Yes Historical Provider, MD  Calcium Carb-Cholecalciferol (CALCIUM 600+D) 600-800 MG-UNIT TABS Take 1 tablet by mouth daily.   Yes Historical Provider, MD  carbidopa-levodopa (SINEMET) 25-100 MG tablet Take 1 tablet by mouth 3 (three) times daily. 02/08/16  Yes Marcial Pacas, MD  cloNIDine  (CATAPRES - DOSED IN MG/24 HR) 0.3 mg/24hr patch Place 0.3 mg onto the skin every Friday.    Yes Historical Provider, MD  DULoxetine (CYMBALTA) 60 MG capsule Take 60 mg by mouth 2 (two) times daily.    Yes Historical Provider, MD  insulin regular human CONCENTRATED (HUMULIN R) 500 UNIT/ML injection Inject 10-20 Units into the skin 2 (two) times daily with a meal. Sliding scale on U-500   Yes Historical Provider, MD  LamoTRIgine XR 200 MG TB24 Take 1 tablet (200 mg total) by mouth at bedtime. 02/08/16  Yes Marcial Pacas, MD  Liraglutide 18 MG/3ML SOPN Inject 1.8 mg into the skin daily. Victoza   Yes Historical Provider, MD  magnesium hydroxide (MILK OF MAGNESIA) 400 MG/5ML suspension  Take 30 mLs by mouth daily as needed for mild constipation.   Yes Historical Provider, MD  metoprolol tartrate (LOPRESSOR) 25 MG tablet Take 12.5 mg by mouth 2 (two) times daily.   Yes Historical Provider, MD  mometasone-formoterol (DULERA) 100-5 MCG/ACT AERO Inhale 2 puffs into the lungs 2 (two) times daily.   Yes Historical Provider, MD  nystatin (MYCOSTATIN/NYSTOP) powder Apply 1 g topically daily as needed (irritation).   Yes Historical Provider, MD  pantoprazole (PROTONIX) 40 MG tablet Take 40 mg by mouth 2 (two) times daily.    Yes Historical Provider, MD  senna (SENOKOT) 8.6 MG TABS tablet Take 2 tablets by mouth at bedtime.    Yes Historical Provider, MD  levothyroxine (SYNTHROID, LEVOTHROID) 137 MCG tablet Take 1 tablet (137 mcg total) by mouth daily before breakfast. 05/20/16   Karma Greaser, MD    Family History Family History  Problem Relation Age of Onset  . Diabetes Father   . Diabetes Sister   . Diabetes Brother   . Melanoma Mother   . CAD Father     Social History Social History  Substance Use Topics  . Smoking status: Never Smoker  . Smokeless tobacco: Not on file  . Alcohol use No     Allergies   Latex; Exenatide; Metformin; Penicillins; and Sulfa antibiotics   Review of  Systems Review of Systems  Constitutional: Negative for fever.  Allergic/Immunologic: Negative for immunocompromised state.  All other systems reviewed and are negative.    Physical Exam Updated Vital Signs BP 174/58 (BP Location: Left Arm)   Pulse 62   Temp 97.6 F (36.4 C) (Oral)   Resp 18   SpO2 98%   Physical Exam  Constitutional: She is oriented to person, place, and time. She appears well-developed and well-nourished. No distress.  HENT:  Head: Normocephalic and atraumatic.  Mouth/Throat: Oropharynx is clear and moist.  Eyes: Conjunctivae are normal.  Neck: Neck supple.  Cardiovascular: Normal rate and regular rhythm.   No murmur heard. Pulmonary/Chest: Effort normal and breath sounds normal. No respiratory distress.  Abdominal: Soft. She exhibits no distension and no mass. There is no tenderness. There is no rebound and no guarding. No hernia.  Musculoskeletal: She exhibits no edema.  Neurological: She is oriented to person, place, and time. She displays normal reflexes. No cranial nerve deficit or sensory deficit. She exhibits normal muscle tone. Coordination normal.  Intention tremor Moves all four extremities Appears sleepy, keeps eyes closed during my evaluation  Skin: Skin is warm and dry. Capillary refill takes less than 2 seconds.  Psychiatric: She has a normal mood and affect.  Nursing note and vitals reviewed.    ED Treatments / Results  Labs (all labs ordered are listed, but only abnormal results are displayed) Labs Reviewed  BASIC METABOLIC PANEL - Abnormal; Notable for the following:       Result Value   Potassium 5.6 (*)    Chloride 97 (*)    Glucose, Bld 106 (*)    BUN 30 (*)    Creatinine, Ser 2.01 (*)    GFR calc non Af Amer 23 (*)    GFR calc Af Amer 27 (*)    All other components within normal limits  CBC - Abnormal; Notable for the following:    RDW 15.7 (*)    Platelets 148 (*)    All other components within normal limits   URINALYSIS, ROUTINE W REFLEX MICROSCOPIC - Abnormal; Notable for the following:  Protein, ur 30 (*)    Squamous Epithelial / LPF 0-5 (*)    All other components within normal limits  TSH - Abnormal; Notable for the following:    TSH 0.250 (*)    All other components within normal limits  HEPATIC FUNCTION PANEL - Abnormal; Notable for the following:    AST 53 (*)    Alkaline Phosphatase 168 (*)    All other components within normal limits  I-STAT VENOUS BLOOD GAS, ED - Abnormal; Notable for the following:    pO2, Ven 75.0 (*)    Bicarbonate 32.1 (*)    Acid-Base Excess 7.0 (*)    All other components within normal limits  I-STAT CHEM 8, ED - Abnormal; Notable for the following:    Chloride 99 (*)    BUN 33 (*)    Creatinine, Ser 1.80 (*)    Glucose, Bld 108 (*)    Calcium, Ion 1.09 (*)    Hemoglobin 11.9 (*)    HCT 35.0 (*)    All other components within normal limits  INFLUENZA PANEL BY PCR (TYPE A & B)  BRAIN NATRIURETIC PEPTIDE  AMMONIA  BLOOD GAS, VENOUS  I-STAT TROPOININ, ED    EKG  EKG Interpretation  Date/Time:  Saturday May 20 2016 17:01:35 EST Ventricular Rate:  63 PR Interval:  170 QRS Duration: 68 QT Interval:  446 QTC Calculation: 456 R Axis:   -3 Text Interpretation:  Normal sinus rhythm Cannot rule out Inferior infarct , age undetermined Anterior infarct , age undetermined Abnormal ECG Confirmed by Hazle Coca [phone]) on 05/20/2016 6:31:16 PM       Radiology Dg Chest 2 View  Result Date: 05/20/2016 CLINICAL DATA:  Cough.  General lethargy. EXAM: CHEST  2 VIEW COMPARISON:  May 17, 2016 FINDINGS: Scarring in the lateral left lung base. Cardiomegaly. The hila and mediastinum are normal. No pulmonary nodules, masses, or focal infiltrates. IMPRESSION: Scarring in the lateral left lung base. Electronically Signed   By: Dorise Bullion III M.D   On: 05/20/2016 20:20   Ct Head Wo Contrast  Result Date: 05/20/2016 CLINICAL DATA:  Weakness and  tremors EXAM: CT HEAD WITHOUT CONTRAST TECHNIQUE: Contiguous axial images were obtained from the base of the skull through the vertex without intravenous contrast. COMPARISON:  08/12/2015 FINDINGS: Brain: No evidence of acute infarction, hemorrhage, hydrocephalus, extra-axial collection or mass lesion/mass effect. Encephalomalacia is noted in the right frontal lobe consistent with the prior resection. Vascular: No hyperdense vessel or unexpected calcification. Skull: Postsurgical changes are noted in the right frontoparietal region stable from the prior exam. Sinuses/Orbits: No acute finding. Other: None. IMPRESSION: Postsurgical changes.  No acute abnormality noted. Electronically Signed   By: Inez Catalina M.D.   On: 05/20/2016 19:44    Procedures Procedures (including critical care time)  Medications Ordered in ED Medications  sodium chloride 0.9 % bolus 1,000 mL (0 mLs Intravenous Stopped 05/20/16 2115)     Initial Impression / Assessment and Plan / ED Course  I have reviewed the triage vital signs and the nursing notes.  Pertinent labs & imaging results that were available during my care of the patient were reviewed by me and considered in my medical decision making (see chart for details).    Patient initially appears somnolent but husband states that this is her baseline with her eyes closed. She does have a history of dementia per review of records. Given her history of meningioma, obtain a head CT which was unremarkable. Her  tremors appear to be at baseline. Her ammonia is negative. She does not have a urinary tract infection anymore. No fevers or chills. Chest x-ray without pneumonia. No significant uremia. Patient is not actually confused and is able to tell me her past medical history, alert and oriented 3. She does appear tired. Patient is deconditioned but was able to ambulate with her walker. No further workup as no apparent emergent condition.  Regarding the rest of her labs,  patient does have a mild hypokalemia and AKI. EKG without acute changes. Given 1 L fluids and repeat chemistry shows improvement with normal potassium. Patient is not on any potassium supplements. Urinalysis does not reveal any blood to suggest a renal stone and patient does not have any reason to have a obstructive process. In addition, patient was able to urinate without difficulty.  Of note, BNP is unremarkable influenza is negative. Troponin is negative  Her TSH however with very low and this has recently been adjusted. She is followed by endocrinology. Will adjust her levothyroxine from 150 to137 and have her follow-up to endocrine next week.  Given patient's mild AKA and recent antibiotic exposure, possible drug-induced cause. Patient instructed to discontinue all NSAIDs, decrease Lasix to 20 mg twice a day and have follow-up with her primary care provider on Monday for reassessment. Patient has discontinued this antibiotic and is safe for outpatient follow-up.  Tolerating PO. Ate a sandwich.  Discharging good condition. Extensively discussed the medication changes, outpatient plan, and circumflex return precautions. Patient and husband verbalized understanding.  Final Clinical Impressions(s) / ED Diagnoses   Final diagnoses:  Elevated serum creatinine  Low TSH level  Physical deconditioning    New Prescriptions Current Discharge Medication List         Karma Greaser, MD 05/21/16 0019    Quintella Reichert, MD 05/24/16 [phone]

## 2016-05-20 NOTE — ED Triage Notes (Signed)
Pt presents with c/o weakness. The weakness began earlier in the week. She was seen at [facility], diagnosed with UTI, and discharged home on oral abx. She has been taking the abx as instructed but reports the weakness is getting worse and her family says that now she is starting to seem confused at times. She reports anorexia, nausea, vomiting, constipation.  She denies any fevers, chills, dysuria, hematuria, urinary frequency.

## 2016-05-20 NOTE — ED Notes (Signed)
Patient taken to CT/xray.

## 2016-05-21 NOTE — Discharge Instructions (Signed)
Decrease your levothyroxine

## 2016-05-22 ENCOUNTER — Encounter (HOSPITAL_COMMUNITY): Payer: Self-pay | Admitting: *Deleted

## 2016-05-22 ENCOUNTER — Observation Stay (HOSPITAL_COMMUNITY): Payer: Medicare Other

## 2016-05-22 ENCOUNTER — Inpatient Hospital Stay (HOSPITAL_COMMUNITY)
Admission: EM | Admit: 2016-05-22 | Discharge: 2016-05-26 | DRG: 637 | Disposition: A | Discharge status: Skilled Nursing Facility | Payer: Medicare Other | Attending: Internal Medicine | Admitting: Internal Medicine

## 2016-05-22 DIAGNOSIS — Z7401 Bed confinement status: Secondary | ICD-10-CM

## 2016-05-22 DIAGNOSIS — G629 Polyneuropathy, unspecified: Secondary | ICD-10-CM

## 2016-05-22 DIAGNOSIS — R531 Weakness: Secondary | ICD-10-CM

## 2016-05-22 DIAGNOSIS — R339 Retention of urine, unspecified: Secondary | ICD-10-CM

## 2016-05-22 DIAGNOSIS — E063 Autoimmune thyroiditis: Secondary | ICD-10-CM | POA: Diagnosis present

## 2016-05-22 DIAGNOSIS — I4891 Unspecified atrial fibrillation: Secondary | ICD-10-CM | POA: Diagnosis present

## 2016-05-22 DIAGNOSIS — E1151 Type 2 diabetes mellitus with diabetic peripheral angiopathy without gangrene: Secondary | ICD-10-CM | POA: Diagnosis present

## 2016-05-22 DIAGNOSIS — I10 Essential (primary) hypertension: Secondary | ICD-10-CM | POA: Diagnosis present

## 2016-05-22 DIAGNOSIS — R4182 Altered mental status, unspecified: Secondary | ICD-10-CM

## 2016-05-22 DIAGNOSIS — E1142 Type 2 diabetes mellitus with diabetic polyneuropathy: Secondary | ICD-10-CM

## 2016-05-22 DIAGNOSIS — J449 Chronic obstructive pulmonary disease, unspecified: Secondary | ICD-10-CM | POA: Diagnosis present

## 2016-05-22 DIAGNOSIS — J410 Simple chronic bronchitis: Secondary | ICD-10-CM

## 2016-05-22 DIAGNOSIS — E782 Mixed hyperlipidemia: Secondary | ICD-10-CM | POA: Diagnosis present

## 2016-05-22 DIAGNOSIS — Z794 Long term (current) use of insulin: Secondary | ICD-10-CM

## 2016-05-22 DIAGNOSIS — G9341 Metabolic encephalopathy: Secondary | ICD-10-CM | POA: Diagnosis present

## 2016-05-22 DIAGNOSIS — R740 Nonspecific elevation of levels of transaminase and lactic acid dehydrogenase [LDH]: Secondary | ICD-10-CM

## 2016-05-22 DIAGNOSIS — Z7951 Long term (current) use of inhaled steroids: Secondary | ICD-10-CM

## 2016-05-22 DIAGNOSIS — E162 Hypoglycemia, unspecified: Secondary | ICD-10-CM

## 2016-05-22 DIAGNOSIS — D329 Benign neoplasm of meninges, unspecified: Secondary | ICD-10-CM | POA: Diagnosis present

## 2016-05-22 DIAGNOSIS — E876 Hypokalemia: Secondary | ICD-10-CM | POA: Diagnosis present

## 2016-05-22 DIAGNOSIS — G40909 Epilepsy, unspecified, not intractable, without status epilepticus: Secondary | ICD-10-CM | POA: Diagnosis not present

## 2016-05-22 DIAGNOSIS — E11649 Type 2 diabetes mellitus with hypoglycemia without coma: Principal | ICD-10-CM | POA: Diagnosis present

## 2016-05-22 DIAGNOSIS — N179 Acute kidney failure, unspecified: Secondary | ICD-10-CM | POA: Diagnosis present

## 2016-05-22 DIAGNOSIS — Z833 Family history of diabetes mellitus: Secondary | ICD-10-CM

## 2016-05-22 DIAGNOSIS — I4892 Unspecified atrial flutter: Secondary | ICD-10-CM | POA: Diagnosis not present

## 2016-05-22 DIAGNOSIS — N184 Chronic kidney disease, stage 4 (severe): Secondary | ICD-10-CM | POA: Diagnosis not present

## 2016-05-22 DIAGNOSIS — Z808 Family history of malignant neoplasm of other organs or systems: Secondary | ICD-10-CM

## 2016-05-22 DIAGNOSIS — I440 Atrioventricular block, first degree: Secondary | ICD-10-CM | POA: Diagnosis present

## 2016-05-22 DIAGNOSIS — E1122 Type 2 diabetes mellitus with diabetic chronic kidney disease: Secondary | ICD-10-CM | POA: Diagnosis present

## 2016-05-22 DIAGNOSIS — Z79899 Other long term (current) drug therapy: Secondary | ICD-10-CM

## 2016-05-22 DIAGNOSIS — K219 Gastro-esophageal reflux disease without esophagitis: Secondary | ICD-10-CM | POA: Diagnosis present

## 2016-05-22 DIAGNOSIS — Z8249 Family history of ischemic heart disease and other diseases of the circulatory system: Secondary | ICD-10-CM

## 2016-05-22 DIAGNOSIS — Z66 Do not resuscitate: Secondary | ICD-10-CM | POA: Diagnosis present

## 2016-05-22 DIAGNOSIS — Z86011 Personal history of benign neoplasm of the brain: Secondary | ICD-10-CM

## 2016-05-22 DIAGNOSIS — Z85828 Personal history of other malignant neoplasm of skin: Secondary | ICD-10-CM

## 2016-05-22 DIAGNOSIS — Z9012 Acquired absence of left breast and nipple: Secondary | ICD-10-CM

## 2016-05-22 DIAGNOSIS — R7401 Elevation of levels of liver transaminase levels: Secondary | ICD-10-CM | POA: Diagnosis present

## 2016-05-22 DIAGNOSIS — Z853 Personal history of malignant neoplasm of breast: Secondary | ICD-10-CM

## 2016-05-22 DIAGNOSIS — F40240 Claustrophobia: Secondary | ICD-10-CM | POA: Diagnosis present

## 2016-05-22 DIAGNOSIS — G4733 Obstructive sleep apnea (adult) (pediatric): Secondary | ICD-10-CM | POA: Diagnosis present

## 2016-05-22 DIAGNOSIS — Z9181 History of falling: Secondary | ICD-10-CM

## 2016-05-22 DIAGNOSIS — R296 Repeated falls: Secondary | ICD-10-CM | POA: Diagnosis present

## 2016-05-22 LAB — HEPATIC FUNCTION PANEL
ALT: 35 U/L (ref 14–54)
AST: 62 U/L — AB (ref 15–41)
Albumin: 3.8 g/dL (ref 3.5–5.0)
Alkaline Phosphatase: 172 U/L — ABNORMAL HIGH (ref 38–126)
BILIRUBIN DIRECT: 0.1 mg/dL (ref 0.1–0.5)
BILIRUBIN INDIRECT: 1 mg/dL — AB (ref 0.3–0.9)
BILIRUBIN TOTAL: 1.1 mg/dL (ref 0.3–1.2)
Total Protein: 6.5 g/dL (ref 6.5–8.1)

## 2016-05-22 LAB — CBC
HCT: 37.5 % (ref 36.0–46.0)
HCT: 37.3 % (ref 36.0–46.0)
HEMOGLOBIN: 11.8 g/dL — AB (ref 12.0–15.0)
Hemoglobin: 11.9 g/dL — ABNORMAL LOW (ref 12.0–15.0)
MCH: 29 pg (ref 26.0–34.0)
MCH: 28.9 pg (ref 26.0–34.0)
MCHC: 31.7 g/dL (ref 30.0–36.0)
MCHC: 31.6 g/dL (ref 30.0–36.0)
MCV: 91.2 fL (ref 78.0–100.0)
MCV: 91.2 fL (ref 78.0–100.0)
PLATELETS: 223 10*3/uL (ref 150–400)
Platelets: 227 10*3/uL (ref 150–400)
RBC: 4.11 MIL/uL (ref 3.87–5.11)
RBC: 4.09 MIL/uL (ref 3.87–5.11)
RDW: 15.7 % — AB (ref 11.5–15.5)
RDW: 15.7 % — AB (ref 11.5–15.5)
WBC: 8.4 10*3/uL (ref 4.0–10.5)
WBC: 10.6 10*3/uL — ABNORMAL HIGH (ref 4.0–10.5)

## 2016-05-22 LAB — I-STAT CG4 LACTIC ACID, ED
LACTIC ACID, VENOUS: 2.69 mmol/L — AB (ref 0.5–1.9)
LACTIC ACID, VENOUS: 2.1 mmol/L — AB (ref 0.5–1.9)

## 2016-05-22 LAB — BASIC METABOLIC PANEL
Anion gap: 10 (ref 5–15)
Anion gap: 11 (ref 5–15)
BUN: 35 mg/dL — AB (ref 6–20)
BUN: 35 mg/dL — AB (ref 6–20)
CALCIUM: 9.5 mg/dL (ref 8.9–10.3)
CO2: 25 mmol/L (ref 22–32)
CO2: 28 mmol/L (ref 22–32)
CREATININE: 1.95 mg/dL — AB (ref 0.44–1.00)
Calcium: 9.5 mg/dL (ref 8.9–10.3)
Chloride: 105 mmol/L (ref 101–111)
Chloride: 101 mmol/L (ref 101–111)
Creatinine, Ser: 1.93 mg/dL — ABNORMAL HIGH (ref 0.44–1.00)
GFR calc Af Amer: 28 mL/min — ABNORMAL LOW (ref >60)
GFR calc non Af Amer: 24 mL/min — ABNORMAL LOW (ref >60)
GFR calc non Af Amer: 24 mL/min — ABNORMAL LOW (ref >60)
GFR, EST AFRICAN AMERICAN: 28 mL/min — AB (ref >60)
GLUCOSE: 36 mg/dL — AB (ref 65–99)
Glucose, Bld: 80 mg/dL (ref 65–99)
Potassium: 3.6 mmol/L (ref 3.5–5.1)
Potassium: 3.6 mmol/L (ref 3.5–5.1)
SODIUM: 140 mmol/L (ref 135–145)
Sodium: 140 mmol/L (ref 135–145)

## 2016-05-22 LAB — URINALYSIS, ROUTINE W REFLEX MICROSCOPIC
Bilirubin Urine: NEGATIVE
Glucose, UA: NEGATIVE mg/dL
Ketones, ur: NEGATIVE mg/dL
Leukocytes, UA: NEGATIVE
Nitrite: NEGATIVE
PH: 5 (ref 5.0–8.0)
Protein, ur: 100 mg/dL — AB
SPECIFIC GRAVITY, URINE: 1.014 (ref 1.005–1.030)
SQUAMOUS EPITHELIAL / LPF: NONE SEEN

## 2016-05-22 LAB — CBG MONITORING, ED
GLUCOSE-CAPILLARY: 120 mg/dL — AB (ref 65–99)
GLUCOSE-CAPILLARY: 73 mg/dL (ref 65–99)
GLUCOSE-CAPILLARY: 70 mg/dL (ref 65–99)
Glucose-Capillary: 78 mg/dL (ref 65–99)
Glucose-Capillary: 16 mg/dL — CL (ref 65–99)
Glucose-Capillary: 242 mg/dL — ABNORMAL HIGH (ref 65–99)
Glucose-Capillary: 124 mg/dL — ABNORMAL HIGH (ref 65–99)

## 2016-05-22 LAB — GLUCOSE, CAPILLARY
GLUCOSE-CAPILLARY: 101 mg/dL — AB (ref 65–99)
GLUCOSE-CAPILLARY: 51 mg/dL — AB (ref 65–99)
GLUCOSE-CAPILLARY: 73 mg/dL (ref 65–99)

## 2016-05-22 LAB — SEDIMENTATION RATE: SED RATE: 15 mm/h (ref 0–22)

## 2016-05-22 LAB — I-STAT TROPONIN, ED: TROPONIN I, POC: 0 ng/mL (ref 0.00–0.08)

## 2016-05-22 LAB — AMMONIA: AMMONIA: 35 umol/L (ref 9–35)

## 2016-05-22 LAB — MRSA PCR SCREENING: MRSA by PCR: NEGATIVE

## 2016-05-22 LAB — CK: Total CK: 72 U/L (ref 38–234)

## 2016-05-22 MED ORDER — LEVOTHYROXINE SODIUM 100 MCG IV SOLR
68.5000 ug | Freq: Every day | INTRAVENOUS | Status: DC
  Administered 2016-05-23 – 2016-05-24 (×2): 68.5 ug via INTRAVENOUS
  Filled 2016-05-22 (×2): qty 5

## 2016-05-22 MED ORDER — ACETAMINOPHEN 325 MG PO TABS: 650.0000 mg | ORAL_TABLET | Freq: Four times a day (QID) | ORAL | Status: DC | PRN

## 2016-05-22 MED ORDER — ONDANSETRON HCL 4 MG PO TABS: 4.0000 mg | ORAL_TABLET | Freq: Four times a day (QID) | ORAL | Status: DC | PRN

## 2016-05-22 MED ORDER — DEXTROSE 50 % IV SOLN
INTRAVENOUS | Status: AC
  Filled 2016-05-22: qty 50

## 2016-05-22 MED ORDER — SODIUM CHLORIDE 0.9 % IV BOLUS (SEPSIS)
500.0000 mL | Freq: Once | INTRAVENOUS | Status: AC
  Administered 2016-05-22: 500 mL via INTRAVENOUS

## 2016-05-22 MED ORDER — ACETAMINOPHEN 650 MG RE SUPP: 650.0000 mg | Freq: Four times a day (QID) | RECTAL | Status: DC | PRN

## 2016-05-22 MED ORDER — PANTOPRAZOLE SODIUM 40 MG IV SOLR
40.0000 mg | INTRAVENOUS | Status: DC
  Administered 2016-05-22 – 2016-05-23 (×2): 40 mg via INTRAVENOUS
  Filled 2016-05-22 (×2): qty 40

## 2016-05-22 MED ORDER — CIPROFLOXACIN IN D5W 400 MG/200ML IV SOLN: 400.0000 mg | INTRAVENOUS | Status: DC

## 2016-05-22 MED ORDER — DEXTROSE 50 % IV SOLN
1.0000 | Freq: Once | INTRAVENOUS | Status: AC
  Administered 2016-05-22: 50 mL via INTRAVENOUS

## 2016-05-22 MED ORDER — CLONIDINE HCL 0.3 MG/24HR TD PTWK
0.3000 mg | MEDICATED_PATCH | TRANSDERMAL | Status: DC
  Administered 2016-05-26: 0.3 mg via TRANSDERMAL
  Filled 2016-05-22: qty 1

## 2016-05-22 MED ORDER — ONDANSETRON HCL 4 MG/2ML IJ SOLN: 4.0000 mg | Freq: Four times a day (QID) | INTRAMUSCULAR | Status: DC | PRN

## 2016-05-22 MED ORDER — ENOXAPARIN SODIUM 30 MG/0.3ML ~~LOC~~ SOLN
30.0000 mg | SUBCUTANEOUS | Status: DC
  Administered 2016-05-22: 30 mg via SUBCUTANEOUS
  Filled 2016-05-22: qty 0.3

## 2016-05-22 MED ORDER — METOPROLOL TARTRATE 5 MG/5ML IV SOLN
5.0000 mg | Freq: Four times a day (QID) | INTRAVENOUS | Status: DC
  Administered 2016-05-22: 5 mg via INTRAVENOUS
  Filled 2016-05-22 (×2): qty 5

## 2016-05-22 MED ORDER — SODIUM CHLORIDE 0.9% FLUSH
3.0000 mL | Freq: Two times a day (BID) | INTRAVENOUS | Status: DC
  Administered 2016-05-22 – 2016-05-26 (×6): 3 mL via INTRAVENOUS

## 2016-05-22 MED ORDER — DEXTROSE-NACL 5-0.9 % IV SOLN
INTRAVENOUS | Status: DC
  Administered 2016-05-22: 100 mL/h via INTRAVENOUS

## 2016-05-22 MED ORDER — INSULIN ASPART 100 UNIT/ML ~~LOC~~ SOLN: 0.0000 [IU] | SUBCUTANEOUS | Status: DC

## 2016-05-22 MED ORDER — DEXTROSE 5 % IV SOLN
INTRAVENOUS | Status: DC
  Administered 2016-05-22: 100 mL via INTRAVENOUS

## 2016-05-22 NOTE — ED Triage Notes (Signed)
PT was tx here on 02/10 and sent home with dx of dehydration.  Family states since then pt is becoming weaker and has fallen once (bruise noted to R shoulder).  Pt appears lethargic in triage, though ao x 4.

## 2016-05-22 NOTE — ED Notes (Signed)
Paged hospitalist due to patient's CBG trending down again.

## 2016-05-22 NOTE — ED Notes (Signed)
Placed patient on the monitor into a gown waiting on provider 

## 2016-05-22 NOTE — ED Provider Notes (Signed)
[facility] DEPT Provider Note   CSN: [id number] Arrival date & time: 05/22/16  1116     History   Chief Complaint Chief Complaint  Patient presents with  . Weakness  . Fall     HPI Morgan [name] Record is a 76 y.o. female with hx of breast cancer, diabetes, htn, GERD, seizures, renal insufficiency, autoimmune thyroditis, htn, meningioma, presents to ED with complaint of generalized weakness and falls. History provided by family. Pt lives with her husband. Pt has had gradual progression in weakness since her meningioma resection 2 years ago. She has had ataxia and frequent falls. Family state in the last week her symptoms have significantly worsened. She is now unable to sit up, walk, or perform daily activities unassisted. Daughter states she was unable to feed herself today. Yesterday pt had a fall after she got up and was unable to ambulate and her husband could not catch her. Today pt unable to sit up, stand up, generalized weak with no focal nurodeficit. Daughter states she has also been intermittently confused. She was seen in ED several days ago at [facility], diagnosed with UTI. Pt again was seen in our ED 2 days ago, discharged yesterday morning, with diagnosis of dehydration.   Past Medical History:  Diagnosis Date  . Abnormal liver function tests   . Acquired autoimmune hypothyroidism   . Basal cell carcinoma    Chest  . Cancer (HCC)    Breast  . Combined hyperlipidemia   . COPD with acute exacerbation ([place])   . Depression   . Diabetes mellitus   . Diabetes type 2, uncontrolled ([place])   . DM neuropathy with neurologic complication ([place])   . Fracture    Left wrist  . Goiter   . Gout   . History of gastroesophageal reflux (GERD)   . Hypertension   . Hypoglycemia associated with diabetes ([place])   . Meningioma ([place])   . Obesity   . Renal insufficiency   . Seizure ([place])   . Shoulder fracture, right 07/2015  . Sleep apnea, obstructive   . Thyroiditis, autoimmune   . Type  II diabetes mellitus with peripheral angiopathy ([place])   . Vertigo     Patient Active Problem List   Diagnosis Date Noted  . Parkinsonism ([place]) 02/24/2016  . UTI (lower urinary tract infection) 08/11/2015  . Delirium 08/11/2015  . Seizures ([place]) 04/27/2015  . Atrial flutter ([place]) 03/15/2015  . Mitral regurgitation 03/15/2015  . Acute left systolic heart failure ([place]) 01/15/2015  . Partial seizure ([place]) 07/23/2014  . Cognitive impairment 07/23/2014  . Abnormality of gait 07/23/2014  . 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 Acute respiratory failure, unspecified whether with hypoxia or hypercapnia ([place])   . Hyponatremia   . Peripheral neuropathy ([place])   . Essential hypertension   . Gastroesophageal reflux disease without esophagitis   . Meningioma ([place])   . Acute urinary retention   . HLD (hyperlipidemia)   . Other emphysema ([place])   . Other specified hypothyroidism   . Primary gout   . Brain mass 05/03/2014  . Acute encephalopathy 05/03/2014  . Left-sided weakness 05/03/2014  . Aphasia 05/03/2014  . Abnormal head CT   . Left hemiparesis ([place])   . 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 Peripheral angiopathy due to secondary diabetes ([place]) 01/04/2012  . Diabetes type 2, uncontrolled ([place])   . Hypoglycemia associated with diabetes ([place])   . Edema of both legs   . Combined hyperlipidemia   .  Acquired autoimmune hypothyroidism   . Thyroiditis, autoimmune   . Abnormal liver function tests   . Sleep apnea, obstructive   . History of gastroesophageal reflux (GERD)   . Depression   . Type 2 diabetes mellitus with polyneuropathy ([place])   . COPD with acute exacerbation ([place])   . Goiter   . Fatigue   . Vertigo   . Type II diabetes mellitus with peripheral angiopathy ([place])   . Gout   . Pallor   . Type II or unspecified type diabetes mellitus without mention of complication, uncontrolled 08/01/2010  . Hypertension 08/01/2010  . Mixed hyperlipidemia  08/01/2010  . Obesity 08/01/2010    Past Surgical History:  Procedure Laterality Date  . APPENDECTOMY    . BACK SURGERY    . BASAL CELL CARCINOMA EXCISION     Chest  . CHOLECYSTECTOMY    . CRANIOTOMY Right 05/08/2014   Procedure: CRANIOTOMY FOR MENINGIOMA;  Surgeon: Ashok Pall, MD;  Location: [facility]URO ORS;  Service: Neurosurgery;  Laterality: Right;  Right Craniotomy for meningioma  . ESOPHAGOGASTRODUODENOSCOPY (EGD) WITH PROPOFOL N/A 05/22/2014   Procedure: ESOPHAGOGASTRODUODENOSCOPY (EGD) WITH PROPOFOL;  Surgeon: Wonda Horner, MD;  Location: [facility] ENDOSCOPY;  Service: Endoscopy;  Laterality: N/A;  . LAPAROSCOPIC GASTRIC BANDING    . MASTECTOMY Left     OB History    No data available       Home Medications    Prior to Admission medications   Medication Sig Start Date End Date Taking? Authorizing Provider  acetaminophen (TYLENOL) 325 MG tablet Take 650 mg by mouth every 6 (six) hours as needed for mild pain.     Historical Provider, MD  amiodarone (PACERONE) 200 MG tablet TAKE 1 TABLET (200 MG TOTAL) BY MOUTH DAILY. Patient taking differently: Take 100 mg by mouth daily. Takes 1/2 tab 02/14/16   [name], MD  antiseptic oral rinse (BIOTENE) LIQD 15 mLs by Mouth Rinse route at bedtime.     Historical Provider, MD  ARIPiprazole (ABILIFY) 5 MG tablet Take 5 mg by mouth every evening.    Historical Provider, MD  Calcium Carb-Cholecalciferol (CALCIUM 600+D) 600-800 MG-UNIT TABS Take 1 tablet by mouth daily.    Historical Provider, MD  carbidopa-levodopa (SINEMET) 25-100 MG tablet Take 1 tablet by mouth 3 (three) times daily. 02/08/16   Marcial Pacas, MD  cloNIDine (CATAPRES - DOSED IN MG/24 HR) 0.3 mg/24hr patch Place 0.3 mg onto the skin every Friday.     Historical Provider, MD  DULoxetine (CYMBALTA) 60 MG capsule Take 60 mg by mouth 2 (two) times daily.     Historical Provider, MD  insulin regular human CONCENTRATED (HUMULIN R) 500 UNIT/ML injection Inject 10-20 Units into the  skin 2 (two) times daily with a meal. Sliding scale on U-500    Historical Provider, MD  LamoTRIgine XR 200 MG TB24 Take 1 tablet (200 mg total) by mouth at bedtime. 02/08/16   Marcial Pacas, MD  levothyroxine (SYNTHROID, LEVOTHROID) 137 MCG tablet Take 1 tablet (137 mcg total) by mouth daily before breakfast. 05/20/16   Karma Greaser, MD  Liraglutide 18 MG/3ML SOPN Inject 1.8 mg into the skin daily. Victoza    Historical Provider, MD  magnesium hydroxide (MILK OF MAGNESIA) 400 MG/5ML suspension Take 30 mLs by mouth daily as needed for mild constipation.    Historical Provider, MD  metoprolol tartrate (LOPRESSOR) 25 MG tablet Take 12.5 mg by mouth 2 (two) times daily.    Historical Provider, MD  mometasone-formoterol (DULERA) 100-5 MCG/ACT AERO Inhale 2 puffs into the lungs 2 (two) times daily.    Historical Provider, MD  nystatin (MYCOSTATIN/NYSTOP) powder Apply 1 g topically daily as needed (irritation).    Historical Provider, MD  pantoprazole (PROTONIX) 40 MG tablet Take 40 mg by mouth 2 (two) times daily.     Historical Provider, MD  senna (SENOKOT) 8.6 MG TABS tablet Take 2 tablets by mouth at bedtime.     Historical Provider, MD    Family History Family History  Problem Relation Age of Onset  . Melanoma Mother   . Diabetes Father   . CAD Father   . Diabetes Sister   . Diabetes Brother     Social History Social History  Substance Use Topics  . Smoking status: Never Smoker  . Smokeless tobacco: Never Used  . Alcohol use No     Allergies   Latex; Exenatide; Metformin; Penicillins; and Sulfa antibiotics   Review of Systems Review of Systems  Unable to perform ROS: Mental status change     Physical Exam Updated Vital Signs BP 139/61   Pulse 62   Temp 97.5 F (36.4 C) (Oral)   Resp 24   Ht 5\' 5"  (1.651 m)   Wt 102.5 kg   SpO2 100%   BMI 37.61 kg/m   Physical Exam  Constitutional: She is oriented to person, place, and time. She appears well-developed and  well-nourished.  Laying in bed with eyes closed  HENT:  Head: Normocephalic.  Eyes: Conjunctivae and EOM are normal. Pupils are equal, round, and reactive to light.  Neck: Normal range of motion. Neck supple.  Cardiovascular: Normal rate, regular rhythm and normal heart sounds.   Pulmonary/Chest: Effort normal and breath sounds normal. No respiratory distress. She has no wheezes. She has no rales.  Abdominal: Soft. Bowel sounds are normal. She exhibits no distension. There is no tenderness. There is no rebound.  Musculoskeletal: She exhibits no edema.  Neurological: She is alert and oriented to person, place, and time. No cranial nerve deficit.  Moving bilateral upper and lower extremities. 5/5 and equal grip strength bilaterally. Unable to perform finger to nose.   Skin: Skin is warm and dry.  Psychiatric: She has a normal mood and affect. Her behavior is normal.  Nursing note and vitals reviewed.    ED Treatments / Results  Labs (all labs ordered are listed, but only abnormal results are displayed) Labs Reviewed  BASIC METABOLIC PANEL - Abnormal; Notable for the following:       Result Value   BUN 35 (*)    Creatinine, Ser 1.95 (*)    GFR calc non Af Amer 24 (*)    GFR calc Af Amer 28 (*)    All other components within normal limits  CBC - Abnormal; Notable for the following:    Hemoglobin 11.9 (*)    RDW 15.7 (*)    All other components within normal limits  URINALYSIS, ROUTINE W REFLEX MICROSCOPIC - Abnormal; Notable for the following:    Hgb urine dipstick MODERATE (*)    Protein, ur 100 (*)    Bacteria, UA RARE (*)    All other components within normal limits  BASIC METABOLIC PANEL - Abnormal; Notable for the following:    BUN 35 (*)    Creatinine, Ser 1.93 (*)    GFR calc non Af Amer 24 (*)    GFR calc Af Amer 28 (*)    All other components within normal  limits  CBC - Abnormal; Notable for the following:    WBC 10.6 (*)    Hemoglobin 11.8 (*)    RDW 15.7 (*)     All other components within normal limits  HEPATIC FUNCTION PANEL - Abnormal; Notable for the following:    AST 62 (*)    Alkaline Phosphatase 172 (*)    Indirect Bilirubin 1.0 (*)    All other components within normal limits  I-STAT CG4 LACTIC ACID, ED - Abnormal; Notable for the following:    Lactic Acid, Venous 2.69 (*)    All other components within normal limits  I-STAT CG4 LACTIC ACID, ED - Abnormal; Notable for the following:    Lactic Acid, Venous 2.10 (*)    All other components within normal limits  CBG MONITORING, ED - Abnormal; Notable for the following:    Glucose-Capillary 16 (*)    All other components within normal limits  CBG MONITORING, ED - Abnormal; Notable for the following:    Glucose-Capillary 242 (*)    All other components within normal limits  CULTURE, BLOOD (ROUTINE X 2)  CULTURE, BLOOD (ROUTINE X 2)  AMMONIA  CK  T4, FREE  T3  HEMOGLOBIN A1C  SEDIMENTATION RATE  LEVETIRACETAM LEVEL  LAMOTRIGINE LEVEL  CBG MONITORING, ED  I-STAT TROPOININ, ED    EKG  EKG Interpretation None       Radiology Dg Chest 2 View  Result Date: 05/20/2016 CLINICAL DATA:  Cough.  General lethargy. EXAM: CHEST  2 VIEW COMPARISON:  May 17, 2016 FINDINGS: Scarring in the lateral left lung base. Cardiomegaly. The hila and mediastinum are normal. No pulmonary nodules, masses, or focal infiltrates. IMPRESSION: Scarring in the lateral left lung base. Electronically Signed   By: Dorise Bullion III M.D   On: 05/20/2016 20:20   Ct Head Wo Contrast  Result Date: 05/20/2016 CLINICAL DATA:  Weakness and tremors EXAM: CT HEAD WITHOUT CONTRAST TECHNIQUE: Contiguous axial images were obtained from the base of the skull through the vertex without intravenous contrast. COMPARISON:  08/12/2015 FINDINGS: Brain: No evidence of acute infarction, hemorrhage, hydrocephalus, extra-axial collection or mass lesion/mass effect. Encephalomalacia is noted in the right frontal lobe consistent  with the prior resection. Vascular: No hyperdense vessel or unexpected calcification. Skull: Postsurgical changes are noted in the right frontoparietal region stable from the prior exam. Sinuses/Orbits: No acute finding. Other: None. IMPRESSION: Postsurgical changes.  No acute abnormality noted. Electronically Signed   By: Inez Catalina M.D.   On: 05/20/2016 19:44    Procedures Procedures (including critical care time)  Medications Ordered in ED Medications - No data to display   Initial Impression / Assessment and Plan / ED Course  I have reviewed the triage vital signs and the nursing notes.  Pertinent labs & imaging results that were available during my care of the patient were reviewed by me and considered in my medical decision making (see chart for details).      Pt in ED with generalized weakness, 3rd trip to ED for the same, has also been seen by PCP this morning. Pt is not complaining of anything. She appears weak but able to follow directions. Unable to sit up. States her back is hurting. WIll get labs. Review prior visits and PCP note. Pt laying down on my evaluation with her eyes closed and only opens them when asked. Based on chart review and prior provider note,  pt was doing same thing during her last visit here. I also mentioned  the fact based on the note yesterday that pt was able to ambulate with a walker, and her husband stated that all she did was get up and walked two steps with assistance.   3:20 PM Pt's labs show persistently elevated creatinine of 1.9, unchanged from two days ago. Otherwise negative. Pt has been able to eat and drink yesterday. Family concerned about persistent deconditioning. Pt's CT head and chest xray were negative yesterday. Her UA was is negative. Will check rectal temp. Blood cultures ordered. Influenza panel negative two days ago.  She has seen her PCP today, who told her to come to ED for admission and further work up.    4:00 PM I was called  to bedside because pt "looked different" to family. Pt with flushed face, continues to lay with eyes closed. She is able to track in all directions, able to open and close her eyes upon my command. Does appear to be somewhat confused. Moving all extremities. VS all within normal.  Awaiting hospitalist call  Spoke with triad, will admit.   4:30 PM Told by RN pt's CBG is 16. D50 ordered. Pt's family upset. Dr. Thomasene Lot at bedside.   Vitals:   05/22/16 1545 05/22/16 1600 05/22/16 1645 05/22/16 1645  BP: (!) 115/38 (!) 124/33 (!) 128/44   Pulse: 73 75 63   Resp: 24 (!) 27 21   Temp:    98.1 F (36.7 C)  TempSrc:    Rectal  SpO2: 95% 94% 92%   Weight:      Height:         Final Clinical Impressions(s) / ED Diagnoses   Final diagnoses:  Altered mental status, unspecified altered mental status type  Hypoglycemia  Generalized weakness    New Prescriptions New Prescriptions   No medications on file     Jeannett Senior, PA-C 05/22/16 [place], MD 05/25/16 [place], MD 05/25/16 1603

## 2016-05-22 NOTE — H&P (Signed)
History and Physical    Morgan [name] BTD:[id number] DOB: 1940/11/18 DOA: 05/22/2016   PCP: Gilford Rile, MD   Patient coming from/Resides with: Private residence/lives with husband  Admission status: Observation/SDU but pending stability of CBGs may change to telemetry -it may be medically necessary to stay a minimum 2 midnights to rule out impending and/or unexpected changes in physiologic status that may differ from initial evaluation performed in the ER and/or at time of admission therefore consider reevaluation of admission status 24 hours.   Chief Complaint: Progressive weakness and falls  HPI: Morgan [name] is a 76 y.o. female with medical history significant for meningioma preceded by seizure activity. Post resection had complicated rehabilitation course but no reemergence of seizures. Patient also has a history of autoimmune thyroiditis and currently is hypothyroid on Synthroid, prior breast cancer, diabetes on multiple agents and has been brittle and difficult to control, hypertension, COPD, atrial flutter not on anticoagulation since meningioma and history of recurrent falls. Patient also has a history of peripheral neuropathy with parkinsonian features followed by Dr. Krista Blue with neurology. Family reports that since meningioma resection patient has had cyclic spells of unexplained weakness and difficulty ambulation that either resolve independently or improved after medication adjustments. Of note, at the end of January patient began having progressive weakness noted during therapy sessions. During this same timeframe she was evaluated at [facility] and was diagnosed with the UTI on February 7 and started on Cipro. She has had 2 different ER visits within the [facility] system since that initial ER visit for unexplained weakness. She has been treated for dehydration and released from the ER. Family report rapid progression of current symptoms over less than 1 month with patient previously being  able to walk with a walker and now she is bedbound. She has not had any significant altered mentation until today while in the ER when her sugar dropped to 16. He was noted she had taken her morning insulin and had not received any oral intake since arrival to the ER. During previous visit to the ER her TSH was also slightly low at 0.250 so Synthroid dose was to be decreased from 150 mcg to 137 mcg but had yet to be accomplished because family had been unable to fill the prescription.  Since arrival to this ER patient developed symptomatic hypoglycemia with CBG dropping from 80 (initially) to 16. She has been treated with concentrated IV dextrose as well as continuous D5 normal saline infusion but remains lethargic although will awaken enough to follow commands if briskly stimulated. In talking with the family she has not had any new medications or adjustments in medications except as described above. She's had decreased urine output and apparent worsening in her renal function prompting PCP to consider outpatient nephrology evaluation. In the ER patient was found to have mild urinary retention with a bladder scan revealing 445 mL. She has also had a previous admission for hypoglycemia recently.  ED Course:  Vital Signs: BP 129/60   Pulse 62   Temp 98.1 F (36.7 C) (Rectal)   Resp 22   Ht '5\' 5"'  (1.651 m)   Wt 102.5 kg (226 lb)   SpO2 99%   BMI 37.61 kg/m  Lab data: Sodium 140, potassium 3.6, chloride 101, CO2 28, glucose 36 with prior reading 80, BUN 35, creatinine 1.93, alk phosphatase 172 with AST 62 with normal total bilirubin, CK 72, poc troponin 0.00, lactic acid 2.69 2.10, WBC 10,600 pharyngeal not obtained, hemoglobin 9.8, platelets  227,000, urinalysis mildly abnormal with rare bacteria, moderate hemoglobin, negative leukocytes negative nitrite, 100 protein, wbc's 0-5 noting patient currently on Cipro therapy, urine culture obtained; blood cultures ordered but had not yet been  obtained Medications and treatments: Normal saline bolus 500 mL 1, D50 one aunt 2, D5 normal saline at 100/hr  Review of Systems:  In addition to the HPI above,  (obtained from family) No Fever-chills, myalgias or other constitutional symptoms No Headache, changes with Vision or hearing, new weakness, tingling, numbness in any extremity, dizziness, dysarthria or word finding difficulty,  tremors or seizure activity No problems swallowing food or Liquids, indigestion/reflux, choking or coughing while eating, abdominal pain with or after eating No Chest pain, Cough or Shortness of Breath, palpitations, orthopnea or DOE No Abdominal pain, N/V, melena,hematochezia, dark tarry stools, constipation No dysuria, hematuria or flank pain No new skin rashes, lesions, masses or bruises, No new joint pains, aches, swelling or redness No recent unintentional weight gain or loss No polyuria, polydypsia or polyphagia   Past Medical History:  Diagnosis Date  . Abnormal liver function tests   . Acquired autoimmune hypothyroidism   . Basal cell carcinoma    Chest  . Cancer (HCC)    Breast  . Combined hyperlipidemia   . COPD with acute exacerbation ([place])   . Depression   . Diabetes mellitus   . Diabetes type 2, uncontrolled ([place])   . DM neuropathy with neurologic complication ([place])   . Fracture    Left wrist  . Goiter   . Gout   . History of gastroesophageal reflux (GERD)   . Hypertension   . Hypoglycemia associated with diabetes ([place])   . Meningioma ([place])   . Obesity   . Renal insufficiency   . Seizure ([place])   . Shoulder fracture, right 07/2015  . Sleep apnea, obstructive   . Thyroiditis, autoimmune   . Type II diabetes mellitus with peripheral angiopathy ([place])   . Vertigo     Past Surgical History:  Procedure Laterality Date  . APPENDECTOMY    . BACK SURGERY    . BASAL CELL CARCINOMA EXCISION     Chest  . CHOLECYSTECTOMY    . CRANIOTOMY Right 05/08/2014   Procedure: CRANIOTOMY  FOR MENINGIOMA;  Surgeon: Ashok Pall, MD;  Location: [place] [facility];  Service: Neurosurgery;  Laterality: Right;  Right Craniotomy for meningioma  . ESOPHAGOGASTRODUODENOSCOPY (EGD) WITH PROPOFOL N/A 05/22/2014   Procedure: ESOPHAGOGASTRODUODENOSCOPY (EGD) WITH PROPOFOL;  Surgeon: Wonda Horner, MD;  Location: [facility] ENDOSCOPY;  Service: Endoscopy;  Laterality: N/A;  . LAPAROSCOPIC GASTRIC BANDING    . MASTECTOMY Left     Social History   Social History  . Marital status: Married    Spouse name: N/A  . Number of children: 2  . Years of education: 12   Occupational History  . Retired    Social History Main Topics  . Smoking status: Never Smoker  . Smokeless tobacco: Never Used  . Alcohol use No  . Drug use: No  . Sexual activity: No   Other Topics Concern  . Not on file   Social History Narrative   Currently in nursing home for rehab.   Right-handed.   No caffeine use.    Mobility: Rolling walker Work history: Not obtained   Allergies  Allergen Reactions  . Latex Rash  . Exenatide Rash  . Metformin Rash    RENAL INSUFFICIENCY-takes low dose  . Penicillins Rash  . Sulfa Antibiotics Other (See  Comments)    Unknown allergic reaction    Family History  Problem Relation Age of Onset  . Melanoma Mother   . Diabetes Father   . CAD Father   . Diabetes Sister   . Diabetes Brother      Prior to Admission medications   Medication Sig Start Date End Date Taking? Authorizing Provider  acetaminophen (TYLENOL) 325 MG tablet Take 650 mg by mouth every 6 (six) hours as needed for mild pain.    Yes Historical Provider, MD  amiodarone (PACERONE) 200 MG tablet TAKE 1 TABLET (200 MG TOTAL) BY MOUTH DAILY. Patient taking differently: Take 100 mg by mouth daily. Takes 1/2 tab 02/14/16  Yes [name], MD  ARIPiprazole (ABILIFY) 5 MG tablet Take 5 mg by mouth every evening.   Yes Historical Provider, MD  Calcium Carb-Cholecalciferol (CALCIUM 600+D) 600-800 MG-UNIT TABS Take 1  tablet by mouth daily.   Yes Historical Provider, MD  carbidopa-levodopa (SINEMET) 25-100 MG tablet Take 1 tablet by mouth 3 (three) times daily. 02/08/16  Yes Marcial Pacas, MD  ciprofloxacin (CIPRO) 500 MG tablet Take 500 mg by mouth 2 (two) times daily. Started 05-17-16 for 10 days   Yes Historical Provider, MD  cloNIDine (CATAPRES - DOSED IN MG/24 HR) 0.3 mg/24hr patch Place 0.3 mg onto the skin every Friday.    Yes Historical Provider, MD  DULoxetine (CYMBALTA) 60 MG capsule Take 60 mg by mouth 2 (two) times daily.    Yes Historical Provider, MD  furosemide (LASIX) 40 MG tablet Take 40 mg by mouth daily.   Yes Historical Provider, MD  insulin regular human CONCENTRATED (HUMULIN R) 500 UNIT/ML injection Inject 10-20 Units into the skin 2 (two) times daily with a meal. Sliding scale on U-500   Yes Historical Provider, MD  LamoTRIgine XR 200 MG TB24 Take 1 tablet (200 mg total) by mouth at bedtime. 02/08/16  Yes Marcial Pacas, MD  levothyroxine (SYNTHROID, LEVOTHROID) 137 MCG tablet Take 1 tablet (137 mcg total) by mouth daily before breakfast. 05/20/16  Yes Karma Greaser, MD  Liraglutide 18 MG/3ML SOPN Inject 1.8 mg into the skin daily. Victoza   Yes Historical Provider, MD  magnesium hydroxide (MILK OF MAGNESIA) 400 MG/5ML suspension Take 30 mLs by mouth daily as needed for mild constipation.   Yes Historical Provider, MD  metoprolol tartrate (LOPRESSOR) 25 MG tablet Take 12.5 mg by mouth 2 (two) times daily.   Yes Historical Provider, MD  mometasone-formoterol (DULERA) 100-5 MCG/ACT AERO Inhale 2 puffs into the lungs 2 (two) times daily.   Yes Historical Provider, MD  nystatin (MYCOSTATIN/NYSTOP) powder Apply 1 g topically daily as needed (irritation).   Yes Historical Provider, MD  pantoprazole (PROTONIX) 40 MG tablet Take 40 mg by mouth 2 (two) times daily.    Yes Historical Provider, MD  senna (SENOKOT) 8.6 MG TABS tablet Take 2 tablets by mouth at bedtime.    Yes Historical Provider, MD    antiseptic oral rinse (BIOTENE) LIQD 15 mLs by Mouth Rinse route at bedtime.     Historical Provider, MD    Physical Exam: Vitals:   05/22/16 1700 05/22/16 1715 05/22/16 1730 05/22/16 1745  BP: (!) 129/53 (!) 133/49 (!) 122/54 129/60  Pulse: 65 65 63 62  Resp: '22 20 20 22  ' Temp:      TempSrc:      SpO2: 100% 96% 98% 99%  Weight:      Height:  Constitutional: Lethargic but does awaken briefly-ears mildly toxic Eyes: PERRL, lids and conjunctivae normal ENMT: Mucous membranes are moist. Posterior pharynx clear of any exudate or lesions. Neck: normal, supple, no masses, no thyromegaly Respiratory: clear to auscultation bilaterally, no wheezing, no crackles. Normal respiratory effort. No accessory muscle use.  Cardiovascular: Regular rate and rhythm, no murmurs / rubs / gallops. No extremity edema. 2+ pedal pulses. No carotid bruits.  Abdomen: no tenderness, no masses palpated. No hepatosplenomegaly. Bowel sounds positive.  Musculoskeletal: no clubbing / cyanosis. No joint deformity upper and lower extremities. Good ROM, no contractures. Normal muscle tone.  Skin: no rashes, lesions, ulcers. No induration Neurologic: CN 2-12 grossly intact limited exam noting patient somewhat inconsistent and participate in with exam. Patient is drooling from the right side of the mouth and having some difficulty controlling oral secretions but there is no stridor noted Sensation intact, DTR normal with hyperreflexive patellar DTRs. Strength 2/5 x all 4 extremities but difficult to test secondary to lethargic state Psychiatric: Lethargic and unable to test accurately.    Labs on Admission: I have personally reviewed following labs and imaging studies  CBC:  Recent Labs Lab 05/20/16 1648 05/20/16 2127 05/22/16 1201 05/22/16 1439  WBC 7.5  --  8.4 10.6*  HGB 12.3 11.9* 11.9* 11.8*  HCT 38.1 35.0* 37.5 37.3  MCV 90.7  --  91.2 91.2  PLT 148*  --  223 323   Basic Metabolic  Panel:  Recent Labs Lab 05/20/16 1648 05/20/16 2127 05/22/16 1201 05/22/16 1439  NA 135 139 140 140  K 5.6* 4.0 3.6 3.6  CL 97* 99* 105 101  CO2 25  --  25 28  GLUCOSE 106* 108* 80 36*  BUN 30* 33* 35* 35*  CREATININE 2.01* 1.80* 1.95* 1.93*  CALCIUM 9.3  --  9.5 9.5   GFR: Estimated Creatinine Clearance: 29.9 mL/min (by C-G formula based on SCr of 1.93 mg/dL (H)). Liver Function Tests:  Recent Labs Lab 05/20/16 1907 05/22/16 1439  AST 53* 62*  ALT 19 35  ALKPHOS 168* 172*  BILITOT 1.0 1.1  PROT 6.5 6.5  ALBUMIN 3.5 3.8   No results for input(s): LIPASE, AMYLASE in the last 168 hours.  Recent Labs Lab 05/20/16 1907 05/22/16 1554  AMMONIA 20 35   Coagulation Profile: No results for input(s): INR, PROTIME in the last 168 hours. Cardiac Enzymes:  Recent Labs Lab 05/22/16 1439  CKTOTAL 72   BNP (last 3 results) No results for input(s): PROBNP in the last 8760 hours. HbA1C: No results for input(s): HGBA1C in the last 72 hours. CBG:  Recent Labs Lab 05/22/16 1155 05/22/16 1619 05/22/16 1650 05/22/16 1756  GLUCAP 78 16* 242* 124*   Lipid Profile: No results for input(s): CHOL, HDL, LDLCALC, TRIG, CHOLHDL, LDLDIRECT in the last 72 hours. Thyroid Function Tests:  Recent Labs  05/20/16 1907  TSH 0.250*   Anemia Panel: No results for input(s): VITAMINB12, FOLATE, FERRITIN, TIBC, IRON, RETICCTPCT in the last 72 hours. Urine analysis:    Component Value Date/Time   COLORURINE YELLOW 05/22/2016 1559   APPEARANCEUR CLEAR 05/22/2016 1559   LABSPEC 1.014 05/22/2016 1559   PHURINE 5.0 05/22/2016 1559   GLUCOSEU NEGATIVE 05/22/2016 1559   HGBUR MODERATE (A) 05/22/2016 1559   BILIRUBINUR NEGATIVE 05/22/2016 1559   KETONESUR NEGATIVE 05/22/2016 1559   PROTEINUR 100 (A) 05/22/2016 1559   UROBILINOGEN 0.2 01/15/2015 2141   NITRITE NEGATIVE 05/22/2016 1559   LEUKOCYTESUR NEGATIVE 05/22/2016 1559   Sepsis  Labs: '@LABRCNTIP' (procalcitonin:4,lacticidven:4) )No results found for this or any previous visit (from the past 240 hour(s)).   Radiological Exams on Admission: Dg Chest 2 View  Result Date: 05/20/2016 CLINICAL DATA:  Cough.  General lethargy. EXAM: CHEST  2 VIEW COMPARISON:  May 17, 2016 FINDINGS: Scarring in the lateral left lung base. Cardiomegaly. The hila and mediastinum are normal. No pulmonary nodules, masses, or focal infiltrates. IMPRESSION: Scarring in the lateral left lung base. Electronically Signed   By: Dorise Bullion III M.D   On: 05/20/2016 20:20   Ct Head Wo Contrast  Result Date: 05/20/2016 CLINICAL DATA:  Weakness and tremors EXAM: CT HEAD WITHOUT CONTRAST TECHNIQUE: Contiguous axial images were obtained from the base of the skull through the vertex without intravenous contrast. COMPARISON:  08/12/2015 FINDINGS: Brain: No evidence of acute infarction, hemorrhage, hydrocephalus, extra-axial collection or mass lesion/mass effect. Encephalomalacia is noted in the right frontal lobe consistent with the prior resection. Vascular: No hyperdense vessel or unexpected calcification. Skull: Postsurgical changes are noted in the right frontoparietal region stable from the prior exam. Sinuses/Orbits: No acute finding. Other: None. IMPRESSION: Postsurgical changes.  No acute abnormality noted. Electronically Signed   By: Inez Catalina M.D.   On: 05/20/2016 19:44    EKG: (Independently reviewed) sinus rhythm with first-degree AV block, ventricular rate 61 bpm, QTC 444 ms, delayed R-wave rotation but no acute ischemic changes  Assessment/Plan Principal Problem:   Acute metabolic encephalopathy due to hypoglycemia -Patient presented with generalized weakness of uncertain etiology and later developed acute encephalopathy in setting of profound hypoglycemia w/ CBG 16 -After administration of D50 2+ continuous D5 normal saline infusion most recent CBG > 200 -Continue to cycle CBGs every 2  hours until sugars consistently within normal ranges then can consider transition to non-dextrose containing fluids -NPO until more alert  -Ammonia level was WNL  Active Problems:   Peripheral neuropathy with Parkisonian features/ Generalized weakness -Patient has reported chronic issues including cyclic generalized weakness with either spontaneous resolution or related to need to adjust medications -Uncertain if current symptomatology is related to medications -Daughter reports she thinks her mother may have accidentally taken old Keppra pills and is unclear as to how much she may have taken-check serum level -The following medications could also be contributing to generalized weakness: Abilify, Cymbalta,and lamotrigine therefore will hold these medications (also is NPO) -Holding Sinemet while nothing by mouth -Once more stable (improved CBGs/airway) will need to undergo a noncontrasted MRI of brain (CKD 4)-of note patient has claustrophobia and daughter reports in the past patient has required Ativan for sedation with MRI -I suspect would benefit from formal neurological consultation as well -CK and ESR normal so doubt myositis -Unable to participate in PT/OT evaluation currently    Transaminitis -Chronic ongoing problem with nonobstructive pattern -Slightly increased from previous readings -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    CKD (chronic kidney disease) stage 4, GFR 15-29 ml/min  -GFR has decreased somewhat from the stage III to stage IV range recently -Family reports PCP recommending outpatient nephrology consultation -For completeness of exam check renal ultrasound -Follow labs    Urinary retention -Low-grade retention of less than 500 mL in the ER -Bladder scan every 4 hours 48 hours -Follow up on renal ultrasound -Patient takes multiple medications at home that could be contributing -May have a degree of diabetic related neurogenic bladder -No indication to place Foley  catheter at this juncture -Recent UTI on Cipro-I have changed to IV and given CKD have decreased  the dosage to 400 mg daily-urine culture pending    Type 2 diabetes mellitus with polyneuropathy  -As noted above, patient with symptomatic hypoglycemia here and has been hospitalized in the past with same -Current hypoglycemic agents on hold for now in favor of dextrose infusion -Given CKD 3-4 may need to reconsider use of Victoza and instead use medications such as Tradjenta or Januvia -Has mild sliding scale for use at home -Polyneuropathy followed by Dr. Krista Blue with neurology    History of Meningioma and preoperative seizures -History of resection of large frontal meningioma January 2016 with postoperative course complicated by bowel obstruction and a 10 month rehabilitation stay according to family -Has not had further seizures postoperatively -On Lamotrigine at home but unable to give currently secondary to NPO -No longer on Keppra although apparently may have taken unintended doses recently    Essential hypertension -Continue low-dose IV Lopressor while NPO -Current blood pressure controlled    Atrial flutter  -Maintaining sinus rhythm with first-degree AV block -Beta blocker as above -Long-term Amiodarone but holding secondary to NPO status -Anticoagulation discontinued secondary to meningioma resection and recurrent falls (see OP cardiology note)    Mixed hyperlipidemia -Not on statin drug prior to admission    Thyroiditis, autoimmune -Recent TSH slightly low with previous recommendation to decrease from 150 to 137 g but unable to be accomplished secondary to inability to fill prescription in a timely manner -Continue IV -use half oral dose -Check free T4 and T3    Sleep apnea, obstructive    COPD (chronic obstructive pulmonary disease)  -Stable without active wheezing      DVT prophylaxis: Lovenox dose adjusted for low GFR  Code Status: DO NOT RESUSCITATE Family  Communication: Husband and daughter at bedside  Disposition Plan: Anticipate discharge to preadmission home environment pending PT/OT evaluation. Patient may require change to inpatient status based on above evaluation and also may meet requirements to discharge to skilled nursing facility Consults called: None     Quaron Delacruz L. ANP-BC [facility] Pager [phone]   If 7PM-7AM, please contact night-coverage [email or web address] Password [facility]  05/22/2016, 6:13 PM

## 2016-05-22 NOTE — ED Notes (Signed)
Bladder scan showed 445 ml

## 2016-05-22 NOTE — ED Notes (Signed)
Checked patient cbg it was 30 notified RN Stacy of blood sugar

## 2016-05-22 NOTE — ED Notes (Signed)
On way to US 

## 2016-05-22 NOTE — ED Notes (Signed)
Patient very weak and lethargic.  Unable to administer orthostatic vitals.

## 2016-05-23 ENCOUNTER — Inpatient Hospital Stay (HOSPITAL_COMMUNITY): Payer: Medicare Other

## 2016-05-23 DIAGNOSIS — I4892 Unspecified atrial flutter: Secondary | ICD-10-CM | POA: Diagnosis present

## 2016-05-23 DIAGNOSIS — E162 Hypoglycemia, unspecified: Secondary | ICD-10-CM | POA: Diagnosis present

## 2016-05-23 DIAGNOSIS — Z833 Family history of diabetes mellitus: Secondary | ICD-10-CM | POA: Diagnosis not present

## 2016-05-23 DIAGNOSIS — I10 Essential (primary) hypertension: Secondary | ICD-10-CM | POA: Diagnosis present

## 2016-05-23 DIAGNOSIS — Z8249 Family history of ischemic heart disease and other diseases of the circulatory system: Secondary | ICD-10-CM | POA: Diagnosis not present

## 2016-05-23 DIAGNOSIS — G40909 Epilepsy, unspecified, not intractable, without status epilepticus: Secondary | ICD-10-CM | POA: Diagnosis present

## 2016-05-23 DIAGNOSIS — E782 Mixed hyperlipidemia: Secondary | ICD-10-CM | POA: Diagnosis present

## 2016-05-23 DIAGNOSIS — I4891 Unspecified atrial fibrillation: Secondary | ICD-10-CM | POA: Diagnosis present

## 2016-05-23 DIAGNOSIS — E1122 Type 2 diabetes mellitus with diabetic chronic kidney disease: Secondary | ICD-10-CM | POA: Diagnosis present

## 2016-05-23 DIAGNOSIS — Z794 Long term (current) use of insulin: Secondary | ICD-10-CM | POA: Diagnosis not present

## 2016-05-23 DIAGNOSIS — G4733 Obstructive sleep apnea (adult) (pediatric): Secondary | ICD-10-CM | POA: Diagnosis present

## 2016-05-23 DIAGNOSIS — N184 Chronic kidney disease, stage 4 (severe): Secondary | ICD-10-CM | POA: Diagnosis present

## 2016-05-23 DIAGNOSIS — E1151 Type 2 diabetes mellitus with diabetic peripheral angiopathy without gangrene: Secondary | ICD-10-CM | POA: Diagnosis present

## 2016-05-23 DIAGNOSIS — K219 Gastro-esophageal reflux disease without esophagitis: Secondary | ICD-10-CM | POA: Diagnosis present

## 2016-05-23 DIAGNOSIS — R531 Weakness: Secondary | ICD-10-CM | POA: Diagnosis not present

## 2016-05-23 DIAGNOSIS — E063 Autoimmune thyroiditis: Secondary | ICD-10-CM | POA: Diagnosis present

## 2016-05-23 DIAGNOSIS — Z9012 Acquired absence of left breast and nipple: Secondary | ICD-10-CM | POA: Diagnosis not present

## 2016-05-23 DIAGNOSIS — J42 Unspecified chronic bronchitis: Secondary | ICD-10-CM | POA: Diagnosis not present

## 2016-05-23 DIAGNOSIS — G6289 Other specified polyneuropathies: Secondary | ICD-10-CM | POA: Diagnosis not present

## 2016-05-23 DIAGNOSIS — Z85828 Personal history of other malignant neoplasm of skin: Secondary | ICD-10-CM | POA: Diagnosis not present

## 2016-05-23 DIAGNOSIS — G9341 Metabolic encephalopathy: Secondary | ICD-10-CM | POA: Diagnosis present

## 2016-05-23 DIAGNOSIS — E876 Hypokalemia: Secondary | ICD-10-CM | POA: Diagnosis present

## 2016-05-23 DIAGNOSIS — Z853 Personal history of malignant neoplasm of breast: Secondary | ICD-10-CM | POA: Diagnosis not present

## 2016-05-23 DIAGNOSIS — R740 Nonspecific elevation of levels of transaminase and lactic acid dehydrogenase [LDH]: Secondary | ICD-10-CM | POA: Diagnosis not present

## 2016-05-23 DIAGNOSIS — E11649 Type 2 diabetes mellitus with hypoglycemia without coma: Secondary | ICD-10-CM | POA: Diagnosis present

## 2016-05-23 DIAGNOSIS — Z7951 Long term (current) use of inhaled steroids: Secondary | ICD-10-CM | POA: Diagnosis not present

## 2016-05-23 DIAGNOSIS — N179 Acute kidney failure, unspecified: Secondary | ICD-10-CM | POA: Diagnosis present

## 2016-05-23 DIAGNOSIS — J449 Chronic obstructive pulmonary disease, unspecified: Secondary | ICD-10-CM | POA: Diagnosis present

## 2016-05-23 DIAGNOSIS — Z808 Family history of malignant neoplasm of other organs or systems: Secondary | ICD-10-CM | POA: Diagnosis not present

## 2016-05-23 LAB — COMPREHENSIVE METABOLIC PANEL
ALBUMIN: 3.1 g/dL — AB (ref 3.5–5.0)
ALT: 33 U/L (ref 14–54)
AST: 47 U/L — AB (ref 15–41)
Alkaline Phosphatase: 140 U/L — ABNORMAL HIGH (ref 38–126)
Anion gap: 9 (ref 5–15)
BUN: 31 mg/dL — AB (ref 6–20)
CO2: 25 mmol/L (ref 22–32)
Calcium: 8.7 mg/dL — ABNORMAL LOW (ref 8.9–10.3)
Chloride: 102 mmol/L (ref 101–111)
Creatinine, Ser: 1.74 mg/dL — ABNORMAL HIGH (ref 0.44–1.00)
GFR calc Af Amer: 32 mL/min — ABNORMAL LOW (ref >60)
GFR, EST NON AFRICAN AMERICAN: 27 mL/min — AB (ref >60)
Glucose, Bld: 94 mg/dL (ref 65–99)
POTASSIUM: 3.4 mmol/L — AB (ref 3.5–5.1)
Sodium: 136 mmol/L (ref 135–145)
Total Bilirubin: 1 mg/dL (ref 0.3–1.2)
Total Protein: 5.5 g/dL — ABNORMAL LOW (ref 6.5–8.1)

## 2016-05-23 LAB — T4, FREE: Free T4: 1.68 ng/dL — ABNORMAL HIGH (ref 0.61–1.12)

## 2016-05-23 LAB — CBC
HCT: 32.7 % — ABNORMAL LOW (ref 36.0–46.0)
Hemoglobin: 10.3 g/dL — ABNORMAL LOW (ref 12.0–15.0)
MCH: 28.5 pg (ref 26.0–34.0)
MCHC: 31.5 g/dL (ref 30.0–36.0)
MCV: 90.6 fL (ref 78.0–100.0)
Platelets: 184 10*3/uL (ref 150–400)
RBC: 3.61 MIL/uL — ABNORMAL LOW (ref 3.87–5.11)
RDW: 15.7 % — AB (ref 11.5–15.5)
WBC: 8.5 10*3/uL (ref 4.0–10.5)

## 2016-05-23 LAB — GLUCOSE, CAPILLARY
GLUCOSE-CAPILLARY: 110 mg/dL — AB (ref 65–99)
GLUCOSE-CAPILLARY: 179 mg/dL — AB (ref 65–99)
GLUCOSE-CAPILLARY: 226 mg/dL — AB (ref 65–99)
GLUCOSE-CAPILLARY: 228 mg/dL — AB (ref 65–99)
GLUCOSE-CAPILLARY: 234 mg/dL — AB (ref 65–99)
Glucose-Capillary: 94 mg/dL (ref 65–99)
Glucose-Capillary: 105 mg/dL — ABNORMAL HIGH (ref 65–99)
Glucose-Capillary: 185 mg/dL — ABNORMAL HIGH (ref 65–99)

## 2016-05-23 LAB — LEVETIRACETAM LEVEL: LEVETIRACETAM: 7.4 ug/mL — AB (ref 10.0–40.0)

## 2016-05-23 LAB — MAGNESIUM: MAGNESIUM: 2.1 mg/dL (ref 1.7–2.4)

## 2016-05-23 LAB — PHOSPHORUS: PHOSPHORUS: 4.6 mg/dL (ref 2.5–4.6)

## 2016-05-23 LAB — LAMOTRIGINE LEVEL: Lamotrigine Lvl: 11.5 ug/mL (ref 2.0–20.0)

## 2016-05-23 LAB — HEMOGLOBIN A1C
HEMOGLOBIN A1C: 6.8 % — AB (ref 4.8–5.6)
MEAN PLASMA GLUCOSE: 148 mg/dL

## 2016-05-23 MED ORDER — INSULIN ASPART 100 UNIT/ML ~~LOC~~ SOLN
0.0000 [IU] | Freq: Three times a day (TID) | SUBCUTANEOUS | Status: DC
  Administered 2016-05-23: 2 [IU] via SUBCUTANEOUS
  Administered 2016-05-24: 5 [IU] via SUBCUTANEOUS
  Administered 2016-05-24: 3 [IU] via SUBCUTANEOUS
  Administered 2016-05-24: 7 [IU] via SUBCUTANEOUS
  Administered 2016-05-25 (×2): 2 [IU] via SUBCUTANEOUS
  Administered 2016-05-25: 3 [IU] via SUBCUTANEOUS
  Administered 2016-05-26: 2 [IU] via SUBCUTANEOUS
  Administered 2016-05-26: 3 [IU] via SUBCUTANEOUS

## 2016-05-23 MED ORDER — INSULIN ASPART 100 UNIT/ML ~~LOC~~ SOLN: 0.0000 [IU] | Freq: Every day | SUBCUTANEOUS | Status: DC

## 2016-05-23 MED ORDER — CARBIDOPA-LEVODOPA 25-100 MG PO TABS
1.0000 | ORAL_TABLET | Freq: Three times a day (TID) | ORAL | Status: DC
  Administered 2016-05-23 – 2016-05-26 (×10): 1 via ORAL
  Filled 2016-05-23 (×10): qty 1

## 2016-05-23 MED ORDER — SODIUM CHLORIDE 0.9 % IV SOLN
INTRAVENOUS | Status: DC
  Administered 2016-05-23 – 2016-05-24 (×2): via INTRAVENOUS

## 2016-05-23 MED ORDER — LORAZEPAM 2 MG/ML IJ SOLN
0.5000 mg | Freq: Once | INTRAMUSCULAR | Status: AC
  Administered 2016-05-23: 0.5 mg via INTRAVENOUS
  Filled 2016-05-23: qty 1

## 2016-05-23 MED ORDER — AMIODARONE HCL 200 MG PO TABS
100.0000 mg | ORAL_TABLET | Freq: Every day | ORAL | Status: DC
  Administered 2016-05-23 – 2016-05-26 (×4): 100 mg via ORAL
  Filled 2016-05-23 (×4): qty 1

## 2016-05-23 MED ORDER — ENOXAPARIN SODIUM 60 MG/0.6ML ~~LOC~~ SOLN
50.0000 mg | SUBCUTANEOUS | Status: DC
  Administered 2016-05-23 – 2016-05-25 (×3): 50 mg via SUBCUTANEOUS
  Filled 2016-05-23 (×3): qty 0.6

## 2016-05-23 MED ORDER — METOPROLOL TARTRATE 12.5 MG HALF TABLET
12.5000 mg | ORAL_TABLET | Freq: Two times a day (BID) | ORAL | Status: DC
  Administered 2016-05-23 – 2016-05-26 (×6): 12.5 mg via ORAL
  Filled 2016-05-23 (×7): qty 1

## 2016-05-23 MED ORDER — MOMETASONE FURO-FORMOTEROL FUM 100-5 MCG/ACT IN AERO
2.0000 | INHALATION_SPRAY | Freq: Two times a day (BID) | RESPIRATORY_TRACT | Status: DC
  Administered 2016-05-23 – 2016-05-26 (×5): 2 via RESPIRATORY_TRACT
  Filled 2016-05-23 (×2): qty 8.8

## 2016-05-23 NOTE — Evaluation (Signed)
Physical Therapy Evaluation Patient Details Name: Morgan [name] MRN: [id number] DOB: 10-Jul-1940 Today's Date: 05/23/2016   History of Present Illness  Pt adm with acute encephalopathy due to hypoglycemia. PMH - craniotomy for meningioma 04/2014, DM, peripheral neuropathy, seizures, gout, VDRF, rt shoulder fx, rt foot fx, autoimmune thyroiditis, breast CA, frequent falls, copd, ckd  Clinical Impression  Pt admitted with above diagnosis and presents to PT with functional limitations due to deficits listed below (See PT problem list). Pt needs skilled PT to maximize independence and safety to allow discharge to SNF. Pt very weak and pt's husband unable to provide the level of assist that pt needs. Needs SNF to improve independence and decr burden of care.     Follow Up Recommendations SNF    Equipment Recommendations  None recommended by PT    Recommendations for Other Services       Precautions / Restrictions Precautions Precautions: Fall Restrictions Weight Bearing Restrictions: No      Mobility  Bed Mobility Overal bed mobility: Needs Assistance Bed Mobility: Supine to Sit     Supine to sit: Max assist;HOB elevated     General bed mobility comments: Assist to move legs off bed, elevate trunk into sitting, and to bring hips to EOB.  Transfers Overall transfer level: Needs assistance Equipment used: Rolling walker (2 wheeled);Ambulation equipment used Transfers: Sit to/from [facility] Sit to Stand: Mod assist Stand pivot transfers: Mod assist (with [name])       General transfer comment: Assist to bring hips up and for balance. Used [name] to perform pivot from bed to recliner.  Ambulation/Gait                Stairs            Wheelchair Mobility    Modified Rankin (Stroke Patients Only)       Balance Overall balance assessment: Needs assistance Sitting-balance support: Bilateral upper extremity supported;Feet  supported Sitting balance-Leahy Scale: Poor Sitting balance - Comments: Sat EOB x 15 minutes with UE support and min guard due to posterior lean Postural control: Posterior lean Standing balance support: Bilateral upper extremity supported Standing balance-Leahy Scale: Poor Standing balance comment: Walker or Stedy with min A to maintain static standing                             Pertinent Vitals/Pain Pain Assessment: Faces Faces Pain Scale: Hurts a little bit Pain Location: rt shoulder Pain Descriptors / Indicators: Guarding;Grimacing Pain Intervention(s): Limited activity within patient's tolerance;Monitored during session    Home Living Family/patient expects to be discharged to:: Private residence Living Arrangements: Spouse/significant other Available Help at Discharge: Family;Available 24 hours/day Type of Home: House Home Access: Ramped entrance     Home Layout: One level Home Equipment: Walker - 4 wheels;Grab bars - tub/shower;Grab bars - toilet;Bedside commode;Shower seat;Wheelchair - manual Additional Comments: Spouse also has health problems    Prior Function Level of Independence: Needs assistance   Gait / Transfers Assistance Needed: For the past 1-2 months pt has primarily been performing transfers to w/c with husband. Amb with outpt PT.           Hand Dominance   Dominant Hand: Right    Extremity/Trunk Assessment   Upper Extremity Assessment Upper Extremity Assessment: Defer to OT evaluation    Lower Extremity Assessment Lower Extremity Assessment: Generalized weakness       Communication   Communication:  No difficulties  Cognition Arousal/Alertness: Awake/alert Behavior During Therapy: WFL for tasks assessed/performed Overall Cognitive Status: Impaired/Different from baseline Area of Impairment: Problem solving             Problem Solving: Slow processing      General Comments      Exercises     Assessment/Plan     PT Assessment Patient needs continued PT services  PT Problem List Decreased strength;Decreased activity tolerance;Decreased balance;Decreased mobility          PT Treatment Interventions DME instruction;Gait training;Functional mobility training;Therapeutic activities;Therapeutic exercise;Balance training;Patient/family education    PT Goals (Current goals can be found in the Care Plan section)  Acute Rehab PT Goals Patient Stated Goal: Get stronger PT Goal Formulation: With patient/family Time For Goal Achievement: 06/06/16 Potential to Achieve Goals: Fair    Frequency Min 3X/week   Barriers to discharge Decreased caregiver support husband with own health issues    Co-evaluation               End of Session Equipment Utilized During Treatment: Gait belt Activity Tolerance: Patient limited by fatigue Patient left: in chair;with call bell/phone within reach;with family/visitor present Nurse Communication: Mobility status;Need for lift equipment         Time: 1005-1040 PT Time Calculation (min) (ACUTE ONLY): 35 min   Charges:   PT Evaluation $PT Eval Moderate Complexity: 1 Procedure PT Treatments $Therapeutic Activity: 8-22 mins   PT G CodesShary Decamp [facility] 06-01-2016, 11:36 AM Suanne Marker PT [phone]

## 2016-05-23 NOTE — Progress Notes (Addendum)
PROGRESS NOTE    Morgan [name]  ZOX:[id number] DOB: [birth year] DOA: 05/22/2016 PCP: Gilford Rile, MD   Outpatient Specialists:    Brief Narrative:  Morgan [name] is a 76 y.o. female with medical history significant for meningioma preceded by seizure activity. Post resection had complicated rehabilitation course but no reemergence of seizures. Patient also has a history of autoimmune thyroiditis and currently is hypothyroid on Synthroid, prior breast cancer, diabetes on multiple agents and has been brittle and difficult to control, hypertension, COPD, atrial flutter not on anticoagulation since meningioma and history of recurrent falls. Patient also has a history of peripheral neuropathy with parkinsonian features followed by Dr. Krista Blue with neurology. Family reports that since meningioma resection patient has had cyclic spells of unexplained weakness and difficulty ambulation that either resolve independently or improved after medication adjustments. Of note, at the end of January patient began having progressive weakness noted during therapy sessions. During this same timeframe she was evaluated at [facility] ER and was diagnosed with the UTI on February 7 and started on Cipro. She has had 2 different ER visits within the [facility] system since that initial ER visit for unexplained weakness. She has been treated for dehydration and released from the ER. Family report rapid progression of current symptoms over less than 1 month with patient previously being able to walk with a walker and now she is bedbound. She has not had any significant altered mentation until today while in the ER when her sugar dropped to 16. He was noted she had taken her morning insulin and had not received any oral intake since arrival to the ER. During previous visit to the ER her TSH was also slightly low at 0.250 so Synthroid dose was to be decreased from 150 mcg to 137 mcg but had yet to be accomplished because family had been unable  to fill the prescription.  Since arrival to this ER patient developed symptomatic hypoglycemia with CBG dropping from 80 (initially) to 16. She has been treated with concentrated IV dextrose as well as continuous D5 normal saline infusion but remains lethargic although will awaken enough to follow commands if briskly stimulated. In talking with the family she has not had any new medications or adjustments in medications except as described above. She's had decreased urine output and apparent worsening in her renal function prompting PCP to consider outpatient nephrology evaluation. In the ER patient was found to have mild urinary retention with a bladder scan revealing 445 mL. She has also had a previous admission for hypoglycemia recently.    Assessment & Plan:   Principal Problem:   Acute metabolic encephalopathy due to hypoglycemia Active Problems:   Mixed hyperlipidemia   Thyroiditis, autoimmune   Sleep apnea, obstructive   Type 2 diabetes mellitus with polyneuropathy (HCC)   COPD (chronic obstructive pulmonary disease) (HCC)   History of Meningioma    Peripheral neuropathy with Parkisonian features   Essential hypertension   Atrial flutter (HCC)   Seizure disorder (HCC)   Generalized weakness   Transaminitis   CKD (chronic kidney disease) stage 4, GFR 15-29 ml/min (HCC)   Urinary retention   Hypoglycemia   Hypoglycemia- BS 16 in ER -on D5 IVf-- will reduce as blood sugars responding to IVF -diet ordered -takes humalin and victoza at home for diabetes--- suspect will not be able to take victoza any more -appreciate diabetic coordinator's help  Medication mismanagement -Daughter reports she thinks her mother may have accidentally taken old Keppra pills and is unclear  as to how much she may have taken-check serum level -patient has polypharmacy -daughter usually manages but has had the flu and has been unable to lay out pills so patient and husband have been doing  AKI on  CKD -baseline Cr around 1.5  Peripheral neuropathy with Parkisonian features/ Generalized weakness -Patient has reported chronic issues including cyclic generalized weakness with either spontaneous resolution or related to need to adjust medications -Uncertain if current symptomatology is related to medications -Daughter reports she thinks her mother may have accidentally taken old Keppra pills and is unclear as to how much she may have taken-check serum level -The following medications could also be contributing to generalized weakness: Abilify, Cymbalta,and lamotrigine therefore will hold these medications -Once more stable (improved CBGs/airway) will need to undergo a noncontrasted MRI of brain (CKD 4)-of note patient has claustrophobia and daughter reports in the past patient has required Ativan for sedation with MRI -CK and ESR normal so doubt myositis -PT recommends SNF    Transaminitis -Chronic ongoing problem with nonobstructive pattern -Slightly increased from previous readings -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    CKD (chronic kidney disease) stage 4, GFR 15-29 ml/min  -GFR has decreased somewhat from the stage III to stage IV range recently -Family reports PCP recommending outpatient nephrology consultation -For completeness of exam check renal ultrasound -Follow labs    Urinary retention -Low-grade retention of less than 500 mL in the ER -Bladder scan every 4 hours 48 hours -renal ultrasound:Mild parenchymal thinning of both kidneys  -Patient takes multiple medications at home that could be contributing -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 Krista Blue with neurology    History of Meningioma and preoperative seizures -History of resection of large frontal meningioma January 2016 with postoperative course complicated by bowel obstruction and a 10 month rehabilitation stay according to family -Has not had further seizures postoperatively -On Lamotrigine at home -No longer on Keppra although apparently may have taken unintended doses recently    Essential hypertension -resume home meds when needed    Atrial flutter h/o  -Maintaining sinus rhythm with first-degree AV block -Beta blocker as above -Long-term Amiodarone but holding secondary to NPO status -Anticoagulation discontinued secondary to meningioma resection and recurrent falls (see OP cardiology note)    Mixed hyperlipidemia -Not on statin drug prior to admission    Thyroiditis, autoimmune -Recent TSH slightly low with previous recommendation to decrease from 150 to 137 g but unable to be accomplished secondary to inability to fill prescription in a timely manner -Continue IV -use half oral dose -Check free T4 (slightly high) and T3    Sleep apnea, obstructive    COPD (chronic obstructive pulmonary disease)  -Stable without active wheezing  Hypokalemia -replete   DVT prophylaxis:  Lovenox   Code Status: DNR   Family Communication: daughter  Disposition Plan:  SNF   Consultants:        Subjective: Up in chair-- able to pivot with PT  Objective: Vitals:   05/23/16 0433 05/23/16 0611 05/23/16 0852 05/23/16 1113  BP:  (!) 129/43 (!) 127/51 Marland Kitchen)  136/55  Pulse:  68 72 67  Resp:  17 19 (!) 23  Temp: 98.4 F (36.9 C) 98.5 F (36.9 C) 98.9 F (37.2 C) 97.7 F (36.5 C)  TempSrc: Oral Oral Oral Oral  SpO2:  95% 96% 98%  Weight: 101.9 kg (224 lb 10.4 oz)     Height:        Intake/Output Summary (Last 24 hours) at 05/23/16 1308 Last  data filed at 05/23/16 1300  Gross per 24 hour  Intake          2736.67 ml  Output              300 ml  Net          2436.67 ml   Filed Weights   05/22/16 1146 05/23/16 0433  Weight: 102.5 kg (226 lb) 101.9 kg (224 lb 10.4 oz)    Examination:  General exam: chronically ill appearing Respiratory system: Clear to auscultation. Respiratory effort normal. Cardiovascular system: S1 & S2 heard, RRR. No JVD, murmurs, rubs, gallops or clicks. No pedal edema. Gastrointestinal system: Abdomen is nondistended, soft and nontender. No organomegaly or masses felt. Normal bowel sounds heard. Central nervous system: Alert and oriented. No focal neurological deficits.     Data Reviewed: I have personally reviewed following labs and imaging studies  CBC:  Recent Labs Lab 05/20/16 1648 05/20/16 2127 05/22/16 1201 05/22/16 1439 05/23/16 0351  WBC 7.5  --  8.4 10.6* 8.5  HGB 12.3 11.9* 11.9* 11.8* 10.3*  HCT 38.1 35.0* 37.5 37.3 32.7*  MCV 90.7  --  91.2 91.2 90.6  PLT 148*  --  223 227 621   Basic Metabolic Panel:  Recent Labs Lab 05/20/16 1648 05/20/16 2127 05/22/16 1201 05/22/16 1439 05/23/16 0012 05/23/16 0351  NA 135 139 140 140  --  136  K 5.6* 4.0 3.6 3.6  --  3.4*  CL 97* 99* 105 101  --  102  CO2 25  --  25 28  --  25  GLUCOSE 106* 108* 80 36*  --  94  BUN 30* 33* 35* 35*  --  31*  CREATININE 2.01* 1.80* 1.95* 1.93*  --  1.74*  CALCIUM 9.3  --  9.5 9.5  --  8.7*  MG  --   --   --   --  2.1  --   PHOS  --   --   --   --  4.6  --    GFR: Estimated Creatinine Clearance: 33.1 mL/min (by C-G formula based on SCr of 1.74 mg/dL (H)). Liver Function Tests:  Recent Labs Lab 05/20/16 1907 05/22/16 1439 05/23/16 0351  AST 53* 62* 47*  ALT 19 35 33  ALKPHOS 168* 172* 140*  BILITOT 1.0 1.1 1.0  PROT 6.5 6.5 5.5*  ALBUMIN 3.5 3.8 3.1*   No results for input(s): LIPASE, AMYLASE in the last 168 hours.  Recent Labs Lab 05/20/16 1907 05/22/16 1554  AMMONIA 20 35    Coagulation Profile: No results for input(s): INR, PROTIME in the last 168 hours. Cardiac Enzymes:  Recent Labs Lab 05/22/16 1439  CKTOTAL 72   BNP (last 3 results) No results for input(s): PROBNP in the last 8760 hours. HbA1C:  Recent Labs  05/22/16 1611  HGBA1C 6.8*   CBG:  Recent Labs Lab 05/23/16 0205 05/23/16 0329 05/23/16 0616 05/23/16 0853 05/23/16 1154  GLUCAP 94 105* 110* 179* 226*   Lipid Profile: No results for input(s): CHOL, HDL, LDLCALC, TRIG, CHOLHDL,  LDLDIRECT in the last 72 hours. Thyroid Function Tests:  Recent Labs  05/20/16 1907 05/23/16 0012  TSH 0.250*  --   FREET4  --  1.68*   Anemia Panel: No results for input(s): VITAMINB12, FOLATE, FERRITIN, TIBC, IRON, RETICCTPCT in the last 72 hours. Urine analysis:    Component Value Date/Time   COLORURINE YELLOW 05/22/2016 1559   APPEARANCEUR CLEAR 05/22/2016 1559   LABSPEC 1.014 05/22/2016 1559   PHURINE 5.0 05/22/2016 1559   GLUCOSEU NEGATIVE 05/22/2016 1559   HGBUR MODERATE (A) 05/22/2016 1559   BILIRUBINUR NEGATIVE 05/22/2016 1559   KETONESUR NEGATIVE 05/22/2016 1559   PROTEINUR 100 (A) 05/22/2016 1559   UROBILINOGEN 0.2 01/15/2015 2141   NITRITE NEGATIVE 05/22/2016 1559   LEUKOCYTESUR NEGATIVE 05/22/2016 1559     ) Recent Results (from the past 240 hour(s))  MRSA PCR Screening     Status: None   Collection Time: 05/22/16  9:26 PM  Result Value Ref Range Status   MRSA by PCR NEGATIVE NEGATIVE Final    Comment:        The GeneXpert MRSA Assay (FDA approved for NASAL specimens only), is one component of a comprehensive MRSA colonization surveillance program. It is not intended to diagnose MRSA infection nor to guide or monitor treatment for MRSA infections.       Anti-infectives    Start     Dose/Rate Route Frequency Ordered Stop   05/23/16 1000  ciprofloxacin (CIPRO) IVPB 400 mg  Status:  Discontinued     400 mg 200 mL/hr over 60 Minutes Intravenous Every 24 hours  05/22/16 2127 05/23/16 0754       Radiology Studies: US Renal  Result Date: 05/22/2016 CLINICAL DATA:  Chronic kidney disease, stage IV. Elevated BUN and creatinine. History of diabetes, hypertension, obesity, and breast cancer. EXAM: RENAL / URINARY TRACT ULTRASOUND COMPLETE COMPARISON:  Ultrasound of the abdomen on 05/14/2013 FINDINGS: Right Kidney: Length: 11.4 cm. Minimal renal parenchymal thinning. No focal mass or hydronephrosis. Left Kidney: Length: 11.7 cm. Minimal renal parenchymal thinning. No focal mass or hydronephrosis. Bladder: Appears normal for degree of bladder distention. IMPRESSION: 1. Mild parenchymal thinning of both kidneys. 2. No hydronephrosis or focal mass. Electronically Signed   By: Nolon Nations M.D.   On: 05/22/2016 18:24        Scheduled Meds: . [START ON 05/26/2016] cloNIDine  0.3 mg Transdermal Q Fri  . enoxaparin (LOVENOX) injection  50 mg Subcutaneous Q24H  . levothyroxine  68.5 mcg Intravenous Daily  . metoprolol  5 mg Intravenous Q6H  . pantoprazole (PROTONIX) IV  40 mg Intravenous Q24H  . sodium chloride flush  3 mL Intravenous Q12H   Continuous Infusions: . dextrose 50 mL/hr at 05/23/16 1200     LOS: 0 days    Time spent: 35 min   [place], DO [facility] Pager [phone]  If 7PM-7AM, please contact night-coverage [email or web address] Password [email or web address] 05/23/2016, 1:08 PM

## 2016-05-23 NOTE — Progress Notes (Signed)
Hypoglycemic Event  CBG: 51  Treatment:  4 oz of orange juice   Symptoms: Asymptomatic   Follow-up CBG: Time: 2222 CBG Result: 73   Comments:Will continue to monitor patient, q2 hour CBG's     Lenna Sciara, RN

## 2016-05-23 NOTE — Progress Notes (Signed)
Inpatient Diabetes Program Recommendations  AACE/ADA: New Consensus Statement on Inpatient Glycemic Control (2015)  Target Ranges:  Prepandial:   less than 140 mg/dL      Peak postprandial:   less than 180 mg/dL (1-2 hours)      Critically ill patients:  140 - 180 mg/dL   Lab Results  Component Value Date   GLUCAP 226 (H) 05/23/2016   HGBA1C 6.8 (H) 05/22/2016    Review of Glycemic ControlResults for SYRITA, DOVEL (MRN [id number]) as of 05/23/2016 13:23  Ref. Range 05/22/2016 16:19 05/22/2016 16:50 05/22/2016 17:56 05/22/2016 18:39 05/22/2016 20:12 05/22/2016 20:40 05/22/2016 21:55 05/22/2016 22:22 05/22/2016 23:49 05/23/2016 02:05 05/23/2016 03:29 05/23/2016 06:16 05/23/2016 08:53 05/23/2016 11:54  Glucose-Capillary Latest Ref Range: 65 - 99 mg/dL 16 (LL) 242 (H) 124 (H) 120 (H) 73 70 51 (L) 73 101 (H) 94 105 (H) 110 (H) 179 (H) 226 (H)    Diabetes history: Type 2 diabetes Outpatient Diabetes medications: Victoza 1.8 mg daily, U500 10+1 at Breakfast and 11+1 at supper  Current orders for Inpatient glycemic control:  No DM medications ordered due to Hypoglycemia  Inpatient Diabetes Program Recommendations:    Note that patient was on concentrated U500 insulin.  Per endocrinologist office note, patient's daughter called in December 2017 with hypoglycemia which required an ED visit.  At that time U500 was reduced to 10+1 at Breakfast and 11+1 at supper- this is the equivalent of 50 units U100 insulin with breakfast and 55 units with supper-plus extra if blood sugar high.   May consider d/c of U500 insulin since this is patient's second visit to hospital December for hypoglycemia.  Once blood sugars>200 mg/dL, may consider adding Novolog sensitive tid with meals and HS. May need low dose basal at discharge and follow-up with endocrinologist.   Will follow.  Thanks, Adah Perl, RN, BC-ADM Inpatient Diabetes Coordinator Pager [phone]

## 2016-05-24 LAB — COMPREHENSIVE METABOLIC PANEL
ALBUMIN: 2.9 g/dL — AB (ref 3.5–5.0)
ALK PHOS: 137 U/L — AB (ref 38–126)
ALT: 17 U/L (ref 14–54)
ANION GAP: 10 (ref 5–15)
AST: 39 U/L (ref 15–41)
BILIRUBIN TOTAL: 0.8 mg/dL (ref 0.3–1.2)
BUN: 28 mg/dL — ABNORMAL HIGH (ref 6–20)
CALCIUM: 8.5 mg/dL — AB (ref 8.9–10.3)
CO2: 25 mmol/L (ref 22–32)
Chloride: 101 mmol/L (ref 101–111)
Creatinine, Ser: 1.69 mg/dL — ABNORMAL HIGH (ref 0.44–1.00)
GFR calc non Af Amer: 28 mL/min — ABNORMAL LOW (ref >60)
GFR, EST AFRICAN AMERICAN: 33 mL/min — AB (ref >60)
Glucose, Bld: 214 mg/dL — ABNORMAL HIGH (ref 65–99)
POTASSIUM: 4 mmol/L (ref 3.5–5.1)
Sodium: 136 mmol/L (ref 135–145)
TOTAL PROTEIN: 5.2 g/dL — AB (ref 6.5–8.1)

## 2016-05-24 LAB — GLUCOSE, CAPILLARY
GLUCOSE-CAPILLARY: 199 mg/dL — AB (ref 65–99)
GLUCOSE-CAPILLARY: 219 mg/dL — AB (ref 65–99)
GLUCOSE-CAPILLARY: 212 mg/dL — AB (ref 65–99)
Glucose-Capillary: 267 mg/dL — ABNORMAL HIGH (ref 65–99)
Glucose-Capillary: 311 mg/dL — ABNORMAL HIGH (ref 65–99)
Glucose-Capillary: 198 mg/dL — ABNORMAL HIGH (ref 65–99)

## 2016-05-24 LAB — CBC
HEMATOCRIT: 30.9 % — AB (ref 36.0–46.0)
HEMOGLOBIN: 9.8 g/dL — AB (ref 12.0–15.0)
MCH: 28.7 pg (ref 26.0–34.0)
MCHC: 31.7 g/dL (ref 30.0–36.0)
MCV: 90.4 fL (ref 78.0–100.0)
Platelets: 157 10*3/uL (ref 150–400)
RBC: 3.42 MIL/uL — ABNORMAL LOW (ref 3.87–5.11)
RDW: 15.5 % (ref 11.5–15.5)
WBC: 6.5 10*3/uL (ref 4.0–10.5)

## 2016-05-24 LAB — T3: T3, Total: 72 ng/dL (ref 71–180)

## 2016-05-24 MED ORDER — INSULIN GLARGINE 100 UNIT/ML ~~LOC~~ SOLN
15.0000 [IU] | Freq: Every day | SUBCUTANEOUS | Status: DC
  Administered 2016-05-24 – 2016-05-25 (×2): 15 [IU] via SUBCUTANEOUS
  Filled 2016-05-24 (×4): qty 0.15

## 2016-05-24 MED ORDER — LEVOTHYROXINE SODIUM 100 MCG PO TABS
100.0000 ug | ORAL_TABLET | Freq: Every day | ORAL | Status: DC
  Administered 2016-05-25 – 2016-05-26 (×2): 100 ug via ORAL
  Filled 2016-05-24 (×2): qty 1

## 2016-05-24 MED ORDER — LAMOTRIGINE 100 MG PO TABS
200.0000 mg | ORAL_TABLET | Freq: Every day | ORAL | Status: DC
  Administered 2016-05-24 – 2016-05-25 (×2): 200 mg via ORAL
  Filled 2016-05-24 (×2): qty 2

## 2016-05-24 MED ORDER — PANTOPRAZOLE SODIUM 40 MG PO TBEC
40.0000 mg | DELAYED_RELEASE_TABLET | Freq: Every day | ORAL | Status: DC
  Administered 2016-05-24 – 2016-05-26 (×3): 40 mg via ORAL
  Filled 2016-05-24 (×3): qty 1

## 2016-05-24 NOTE — Evaluation (Signed)
Occupational Therapy Evaluation Patient Details Name: Morgan [name] MRN: [id number] DOB: 08-16-1940 Today's Date: 05/24/2016    History of Present Illness Pt adm with acute encephalopathy due to hypoglycemia. PMH - craniotomy for meningioma 04/2014, DM, peripheral neuropathy, seizures, gout, VDRF, rt shoulder fx, rt foot fx, autoimmune thyroiditis, breast CA, frequent falls, copd, ckd   Clinical Impression   Pt transfers with help and requires extensive assistance for ADL at baseline. Pt able to transfer and perform grooming using the Stedy. Pt with longstanding UE limitations. Pt's husband is not able to provided the care pt requires. Recommending SNF for rehab. Will follow acutely.    Follow Up Recommendations  SNF;Supervision/Assistance - 24 hour    Equipment Recommendations       Recommendations for Other Services       Precautions / Restrictions Precautions Precautions: Fall Restrictions Weight Bearing Restrictions: No      Mobility Bed Mobility Overal bed mobility: Needs Assistance Bed Mobility: Supine to Sit;Sit to Supine     Supine to sit: Mod assist Sit to supine: Mod assist   General bed mobility comments: Moderate assist to elevate trunk to upright and scoot to EOb, use o f chuck pad to assist with scooting  Transfers Overall transfer level: Needs assistance Equipment used:  [name] Transfers: Sit to/from [employer] to Stand: Mod assist;Min assist Stand pivot transfers: Min assist       General transfer comment: moderate assist to stand in stefy intially, then able to perform sit <> stand in steady with min assist and min guard x3 with UE pull on horizontal bar. +2 for safety    Balance Overall balance assessment: Needs assistance Sitting-balance support: Bilateral upper extremity supported;Feet supported Sitting balance-Leahy Scale: Poor Sitting balance - Comments: sat on steady x23minutes with intermittent stnading and min  guard assist Postural control: Posterior lean Standing balance support: Bilateral upper extremity supported Standing balance-Leahy Scale: Poor Standing balance comment: Heavy relaince on UE support to stand (horizontal bar on steady)                            ADL Overall ADL's : Needs assistance/impaired Eating/Feeding: Moderate assistance;Sitting Eating/Feeding Details (indicate cue type and reason): pt can finger feed and drink from a straw, assisted to use utensil Grooming: Oral care;Wash/dry hands;Moderate assistance (in stedy)                   Toilet Transfer: Total assistance;BSC (with Stedy)   Toileting- Clothing Manipulation and Hygiene: Total assistance;Sit to/from stand       Functional mobility during ADLs:  (did not ambulate this visit)       Vision     Perception     Praxis      Pertinent Vitals/Pain Pain Assessment: No/denies pain     Hand Dominance Right   Extremity/Trunk Assessment Upper Extremity Assessment Upper Extremity Assessment: RUE deficits/detail;LUE deficits/detail RUE Deficits / Details: rotator cuff tear, generalized weakness RUE Coordination: decreased fine motor;decreased gross motor LUE Deficits / Details: generalized weakness LUE Coordination: decreased fine motor;decreased gross motor   Lower Extremity Assessment Lower Extremity Assessment: Defer to PT evaluation       Communication Communication Communication: No difficulties   Cognition Arousal/Alertness: Awake/alert Behavior During Therapy: Flat affect Overall Cognitive Status: Impaired/Different from baseline Area of Impairment: Problem solving             Problem Solving: Slow processing  General Comments       Exercises       Shoulder Instructions      Home Living Family/patient expects to be discharged to:: Private residence Living Arrangements: Spouse/significant other Available Help at Discharge: Family;Available 24  hours/day Type of Home: House Home Access: Ramped entrance     Home Layout: One level     Bathroom Shower/Tub: Occupational psychologist: Handicapped height Bathroom Accessibility: Yes How Accessible: Accessible via wheelchair Home Equipment: [place] - 4 wheels;Grab bars - tub/shower;Grab bars - toilet;Bedside commode;Shower seat;Wheelchair - manual   Additional Comments: Spouse also has health problems      Prior Functioning/Environment Level of Independence: Needs assistance  Gait / Transfers Assistance Needed: For the past 1-2 months pt has primarily been performing transfers to w/c with husband. Amb with outpt PT. ADL's / Homemaking Assistance Needed: Pt reports she has required extensive assist with ADL.            OT Problem List: Decreased strength;Decreased activity tolerance;Impaired balance (sitting and/or standing);Decreased coordination;Decreased cognition;Decreased knowledge of use of DME or AE;Obesity;Impaired UE functional use;Decreased range of motion   OT Treatment/Interventions: Self-care/ADL training;DME and/or AE instruction;Patient/family education    OT Goals(Current goals can be found in the care plan section) Acute Rehab OT Goals Patient Stated Goal: Get stronger OT Goal Formulation: With patient Time For Goal Achievement: 05/24/16 Potential to Achieve Goals: Good ADL Goals Pt Will Transfer to Toilet: with mod assist;bedside commode  OT Frequency: Min 2X/week   Barriers to D/C: Decreased caregiver support          Co-evaluation PT/OT/SLP Co-Evaluation/Treatment: Yes Reason for Co-Treatment: Complexity of the patient's impairments (multi-system involvement);For patient/therapist safety PT goals addressed during session: Mobility/safety with mobility;Balance OT goals addressed during session: ADL's and self-care      End of Session Nurse Communication:  (aware of 500cc urine)  Activity Tolerance: Patient tolerated treatment  well Patient left: in bed;with call bell/phone within reach;with bed alarm set;with family/visitor present   Time: 1450-1521 OT Time Calculation (min): 31 min Charges:  OT General Charges $OT Visit: 1 Procedure OT Evaluation $OT Eval Moderate Complexity: 1 Procedure G-Codes:    Malka So 05/24/2016, 3:39 PM  [phone]

## 2016-05-24 NOTE — Care Management Note (Signed)
Case Management Note  Patient Details  Name: Morgan [name] MRN: [id number] Date of Birth: [birth year]  Subjective/Objective:    From home with spouse, Presents with acute metabolic encephalopathy secondary to hypoglycemia, weakness , transaminits, CKD, urinary retention, DM2, hx of meningioma and seizures, htn , aflutter, hld, thyroiditis, and sleep apne, copd.  Pt/ot eval rec SNF, CSW aware.                  Action/Plan:   Expected Discharge Date:                  Expected Discharge Plan:  Skilled Nursing Facility  In-House Referral:  Clinical Social Work  Discharge planning Services  CM Consult  Post Acute Care Choice:    Choice offered to:     DME Arranged:    DME Agency:     HH Arranged:    [place] Agency:     Status of Service:  Completed, signed off  If discussed at [employer], dates discussed:    Additional Comments:  Zenon Mayo, RN 05/24/2016, 3:51 PM

## 2016-05-24 NOTE — Progress Notes (Signed)
Inpatient Diabetes Program Recommendations  AACE/ADA: New Consensus Statement on Inpatient Glycemic Control (2015)  Target Ranges:  Prepandial:   less than 140 mg/dL      Peak postprandial:   less than 180 mg/dL (1-2 hours)      Critically ill patients:  140 - 180 mg/dL   Lab Results  Component Value Date   GLUCAP 267 (H) 05/24/2016   HGBA1C 6.8 (H) 05/22/2016    Review of Glycemic Control:  Results for ARIKA, MAINER (MRN [id number]) as of 05/24/2016 15:01  Ref. Range 05/23/2016 22:48 05/24/2016 01:01 05/24/2016 04:18 05/24/2016 08:42 05/24/2016 12:17  Glucose-Capillary Latest Ref Range: 65 - 99 mg/dL 185 (H) 199 (H) 219 (H) 212 (H) 267 (H)   Diabetes history: Type 2 diabetes Outpatient Diabetes medications: Victoza 1.8 mg daily, U500 10+1 at Breakfast and 11+1 at supper  Current orders for Inpatient glycemic control: Novolog sensitive tid with meals and HS  Inpatient Diabetes Program Recommendations:    While in the hospital, consider adding Lantus 20 units daily (0.2 units/kg).   Thanks, Adah Perl, RN, BC-ADM Inpatient Diabetes Coordinator Pager [phone] (8a-5p)

## 2016-05-24 NOTE — Progress Notes (Signed)
Physical Therapy Treatment Patient Details Name: Morgan [name] MRN: [id number] DOB: 09-05-1940 Today's Date: 05/24/2016    History of Present Illness Pt adm with acute encephalopathy due to hypoglycemia. PMH - craniotomy for meningioma 04/2014, DM, peripheral neuropathy, seizures, gout, VDRF, rt shoulder fx, rt foot fx, autoimmune thyroiditis, breast CA, frequent falls, copd, ckd    PT Comments    Patient demonstrates some improvements in activity tolerance today. Patient was able to perform multiple sit<.stand activities in the [place] steady. Patient progressing from moderate to minimal assist throughout session. Tolerated increased time in sitting and EOB for functional task performance. Patient does continue to demonstrate increased physical needs, SNF remains appropriate. Will continue to see and progress as tolerated.  Follow Up Recommendations  SNF     Equipment Recommendations  None recommended by PT    Recommendations for Other Services       Precautions / Restrictions Precautions Precautions: Fall Restrictions Weight Bearing Restrictions: No    Mobility  Bed Mobility Overal bed mobility: Needs Assistance Bed Mobility: Supine to Sit;Sit to Supine     Supine to sit: Mod assist Sit to supine: Mod assist   General bed mobility comments: Moderate assist to elevate trunk to upright and scoot to EOb, use o f chuck pad to assist with scooting  Transfers Overall transfer level: Needs assistance Equipment used:  Morgan [name]) Transfers: Sit to/from [employer] to Stand: Mod assist;Min assist (moderate assist from bed intially, min guard for stability) Stand pivot transfers: Min assist (with Morgan [name])       General transfer comment: moderate assist to stand in stefy intially, then able to perform sit <> stand in steady with min assist and min guard x3 with UE pull on horizontal bar. +2 for safety  Ambulation/Gait                 Stairs             Wheelchair Mobility    Modified Rankin (Stroke Patients Only)       Balance Overall balance assessment: Needs assistance Sitting-balance support: Bilateral upper extremity supported;Feet supported Sitting balance-Morgan [name] Scale: Poor Sitting balance - Comments: sat on steady x39minutes with intermittent stnading and min guard assist Postural control: Posterior lean Standing balance support: Bilateral upper extremity supported Standing balance-Morgan [name] Scale: Poor Standing balance comment: Heavy relaince on UE support to stand (horizontal bar on steady)                    Cognition Arousal/Alertness: Awake/alert Behavior During Therapy: Flat affect Overall Cognitive Status: Impaired/Different from baseline Area of Impairment: Problem solving             Problem Solving: Slow processing      Exercises      General Comments General comments (skin integrity, edema, etc.): performed multiple sit <> stnad in steady with functional tasks and dynamic balance activities      Pertinent Vitals/Pain Pain Assessment: No/denies pain    Home Living Family/patient expects to be discharged to:: Private residence Living Arrangements: Spouse/significant other Available Help at Discharge: Family;Available 24 hours/day Type of Home: House Home Access: Ramped entrance   Home Layout: One level Home Equipment: [place] - 4 wheels;Grab bars - tub/shower;Grab bars - toilet;Bedside commode;Shower seat;Wheelchair - manual Additional Comments: Spouse also has health problems    Prior Function Level of Independence: Needs assistance  Gait / Transfers Assistance Needed: For the past 1-2 months pt has primarily been performing transfers to  w/c with husband. Amb with outpt PT. ADL's / Homemaking Assistance Needed: Pt reports she has required extensive assist with ADL.     PT Goals (current goals can now be found in the care plan section) Acute Rehab PT Goals Patient Stated Goal:  Get stronger PT Goal Formulation: With patient/family Time For Goal Achievement: 06/06/16 Potential to Achieve Goals: Fair Progress towards PT goals: Progressing toward goals    Frequency    Min 3X/week      PT Plan Current plan remains appropriate    Co-evaluation PT/OT/SLP Co-Evaluation/Treatment: Yes Reason for Co-Treatment: Complexity of the patient's impairments (multi-system involvement);For patient/therapist safety PT goals addressed during session: Mobility/safety with mobility;Balance OT goals addressed during session: ADL's and self-care     End of Session Equipment Utilized During Treatment: Gait belt Activity Tolerance: Patient limited by fatigue Patient left: in bed;with call bell/phone within reach;with family/visitor present     Time: 1450-1521 PT Time Calculation (min) (ACUTE ONLY): 31 min  Charges:  $Therapeutic Activity: 8-22 mins                    G Codes:      Morgan [name] Jun 19, 2016, 4:21 PM Morgan [name], [place] DPT  [phone]

## 2016-05-24 NOTE — Progress Notes (Signed)
PROGRESS NOTE    Morgan [name]  YIF:[id number] DOB: 07-03-1940 DOA: 05/22/2016 PCP: Gilford Rile, MD   Outpatient Specialists:    Brief Narrative:  Morgan [name] is a 76 y.o. female with medical history significant for meningioma preceded by seizure activity. Post resection had complicated rehabilitation course but no reemergence of seizures. Patient also has a history of autoimmune thyroiditis and currently is hypothyroid on Synthroid, prior breast cancer, diabetes on multiple agents and has been brittle and difficult to control, hypertension, COPD, atrial flutter not on anticoagulation since meningioma and history of recurrent falls. Patient also has a history of peripheral neuropathy with parkinsonian features followed by Dr. Krista Blue with neurology. Family reports that since meningioma resection patient has had cyclic spells of unexplained weakness and difficulty ambulation that either resolve independently or improved after medication adjustments. Of note, at the end of January patient began having progressive weakness noted during therapy sessions. During this same timeframe she was evaluated at [facility] ER and was diagnosed with the UTI on February 7 and started on Cipro. She has had 2 different ER visits within the [facility] system since that initial ER visit for unexplained weakness. She has been treated for dehydration and released from the ER. Family report rapid progression of current symptoms over less than 1 month with patient previously being able to walk with a walker and now she is bedbound. She has not had any significant altered mentation until today while in the ER when her sugar dropped to 16. He was noted she had taken her morning insulin and had not received any oral intake since arrival to the ER. During previous visit to the ER her TSH was also slightly low at 0.250 so Synthroid dose was to be decreased from 150 mcg to 137 mcg but had yet to be accomplished because family had been unable  to fill the prescription.  Since arrival to this ER patient developed symptomatic hypoglycemia with CBG dropping from 80 (initially) to 16. She has been treated with concentrated IV dextrose as well as continuous D5 normal saline infusion but remains lethargic although will awaken enough to follow commands if briskly stimulated. In talking with the family she has not had any new medications or adjustments in medications except as described above. She's had decreased urine output and apparent worsening in her renal function prompting PCP to consider outpatient nephrology evaluation. In the ER patient was found to have mild urinary retention with a bladder scan revealing 445 mL. She has also had a previous admission for hypoglycemia recently.    Assessment & Plan:   Principal Problem:   Acute metabolic encephalopathy due to hypoglycemia Active Problems:   Mixed hyperlipidemia   Thyroiditis, autoimmune   Sleep apnea, obstructive   Type 2 diabetes mellitus with polyneuropathy (HCC)   COPD (chronic obstructive pulmonary disease) (HCC)   History of Meningioma    Peripheral neuropathy with Parkisonian features   Essential hypertension   Atrial flutter (HCC)   Seizure disorder (HCC)   Generalized weakness   Transaminitis   CKD (chronic kidney disease) stage 4, GFR 15-29 ml/min (HCC)   Urinary retention   Hypoglycemia   Hypoglycemia- BS 16 in ER -d/c d5IVF -diet ordered -takes U500 and victoza at home for diabetes--- suspect will not be able to take victoza any more and may need a change from the U500 -appreciate diabetic coordinator's help -SSI + lantus for current management  Medication mismanagement -Daughter reports she thinks her mother may have accidentally taken old  Keppra pills and is unclear as to how much she may have taken-check serum level -patient has polypharmacy -daughter usually manages but has had the flu and has been unable to lay out pills so patient and husband have  been doing  AKI on CKD -baseline Cr around 1.5  Peripheral neuropathy with Parkisonian features/ Generalized weakness -Patient has reported chronic issues including cyclic generalized weakness with either spontaneous resolution or related to need to adjust medications -Uncertain if current symptomatology is related to medications -The following medications could also be contributing to generalized weakness: Abilify, Cymbalta,and lamotrigine- resume lamotrgine today -MRI of brain shows no new infarction -CK and ESR normal so doubt myositis -PT recommends SNF    Transaminitis -Chronic ongoing problem with nonobstructive pattern -Slightly increased from previous readings -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 -monitor    CKD (chronic kidney disease) stage 4, GFR 15-29 ml/min  -GFR has decreased somewhat from the stage III to stage IV range recently -Family reports PCP recommending outpatient nephrology consultation -Follow labs    Urinary retention -Patient takes multiple medications at home that could be contributing -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 Krista Blue with neurology    History of Meningioma and preoperative seizures -History of resection of large frontal meningioma January 2016 with postoperative course complicated by bowel obstruction and a 10 month rehabilitation stay according to family -Has not had further seizures postoperatively -On Lamotrigine at home -No longer on Keppra although apparently may have taken unintended doses  recently    Essential hypertension -resume home meds when needed    Atrial flutter h/o  -Maintaining sinus rhythm with first-degree AV block -Beta blocker as above -Anticoagulation discontinued secondary to meningioma resection and recurrent falls (see OP cardiology note)    Mixed hyperlipidemia -Not on statin drug prior to admission    Thyroiditis, autoimmune -TSH low -reduce synthroid -Check free T4 (slightly high) and T3 (normal)    Sleep apnea, obstructive    COPD (chronic obstructive pulmonary disease)  -Stable without active wheezing  Hypokalemia -replete   DVT prophylaxis:  Lovenox   Code Status: DNR   Family Communication: Daughter- called Husband at bedside  Disposition Plan:  SNF tx to tele bed today   Consultants:        Subjective: Eating well, feels stronger today  Objective: Vitals:   05/24/16 0800 05/24/16 0847 05/24/16 1012 05/24/16 1122  BP: (!) 173/65 (!) 173/65  (!) 141/66  Pulse: 68 69 74 66  Resp: 19 (!) _0 Temp: 98.4 F (36.9 C)   97.9 F (36.6 C)  TempSrc: Axillary   Oral  SpO2: 98% 97% 97% 96%  Weight:      Height:        Intake/Output Summary (Last 24 hours) at 05/24/16 1559 Last data filed at 05/24/16 1540  Gross per 24 hour  Intake             1185 ml  Output             1475 ml  Net             -  290 ml   Filed Weights   05/22/16 1146 05/23/16 0433 05/24/16 0500  Weight: 102.5 kg (226 lb) 101.9 kg (224 lb 10.4 oz) 102.6 kg (226 lb 3.1 oz)    Examination:  General exam: chronically ill appearing- obese Respiratory system: Clear to auscultation. Respiratory effort normal. Cardiovascular system: S1 & S2 heard, RRR. No JVD, murmurs, rubs, gallops or clicks. No pedal edema. Gastrointestinal system: Abdomen is nondistended, soft and nontender. No organomegaly or masses felt. Normal bowel sounds heard. Central nervous system: Alert and oriented. No focal neurological deficits.     Data  Reviewed: I have personally reviewed following labs and imaging studies  CBC:  Recent Labs Lab 05/20/16 1648 05/20/16 2127 05/22/16 1201 05/22/16 1439 05/23/16 0351 05/24/16 0201  WBC 7.5  --  8.4 10.6* 8.5 6.5  HGB 12.3 11.9* 11.9* 11.8* 10.3* 9.8*  HCT 38.1 35.0* 37.5 37.3 32.7* 30.9*  MCV 90.7  --  91.2 91.2 90.6 90.4  PLT 148*  --  223 227 184 034   Basic Metabolic Panel:  Recent Labs Lab 05/20/16 1648 05/20/16 2127 05/22/16 1201 05/22/16 1439 05/23/16 0012 05/23/16 0351 05/24/16 0201  NA 135 139 140 140  --  136 136  K 5.6* 4.0 3.6 3.6  --  3.4* 4.0  CL 97* 99* 105 101  --  102 101  CO2 25  --  25 28  --  25 25  GLUCOSE 106* 108* 80 36*  --  94 214*  BUN 30* 33* 35* 35*  --  31* 28*  CREATININE 2.01* 1.80* 1.95* 1.93*  --  1.74* 1.69*  CALCIUM 9.3  --  9.5 9.5  --  8.7* 8.5*  MG  --   --   --   --  2.1  --   --   PHOS  --   --   --   --  4.6  --   --    GFR: Estimated Creatinine Clearance: 34.1 mL/min (by C-G formula based on SCr of 1.69 mg/dL (H)). Liver Function Tests:  Recent Labs Lab 05/20/16 1907 05/22/16 1439 05/23/16 0351 05/24/16 0201  AST 53* 62* 47* 39  ALT 19 35 33 17  ALKPHOS 168* 172* 140* 137*  BILITOT 1.0 1.1 1.0 0.8  PROT 6.5 6.5 5.5* 5.2*  ALBUMIN 3.5 3.8 3.1* 2.9*   No results for input(s): LIPASE, AMYLASE in the last 168 hours.  Recent Labs Lab 05/20/16 1907 05/22/16 1554  AMMONIA 20 35   Coagulation Profile: No results for input(s): INR, PROTIME in the last 168 hours. Cardiac Enzymes:  Recent Labs Lab 05/22/16 1439  CKTOTAL 72   BNP (last 3 results) No results for input(s): PROBNP in the last 8760 hours. HbA1C:  Recent Labs  05/22/16 1611  HGBA1C 6.8*   CBG:  Recent Labs Lab 05/23/16 2248 05/24/16 0101 05/24/16 0418 05/24/16 0842 05/24/16 1217  GLUCAP 185* 199* 219* 212* 267*   Lipid Profile: No results for input(s): CHOL, HDL, LDLCALC, TRIG, CHOLHDL, LDLDIRECT in the last 72 hours. Thyroid  Function Tests:  Recent Labs  05/23/16 0012  FREET4 1.68*   Anemia Panel: No results for input(s): VITAMINB12, FOLATE, FERRITIN, TIBC, IRON, RETICCTPCT in the last 72 hours. Urine analysis:    Component Value Date/Time   COLORURINE YELLOW 05/22/2016 [place] 05/22/2016 1559   LABSPEC 1.014 05/22/2016 1559   PHURINE 5.0 05/22/2016 1559   GLUCOSEU NEGATIVE 05/22/2016 1559   HGBUR MODERATE (A) 05/22/2016 1559  BILIRUBINUR NEGATIVE 05/22/2016 1559   KETONESUR NEGATIVE 05/22/2016 1559   PROTEINUR 100 (A) 05/22/2016 1559   UROBILINOGEN 0.2 01/15/2015 2141   NITRITE NEGATIVE 05/22/2016 1559   LEUKOCYTESUR NEGATIVE 05/22/2016 1559     ) Recent Results (from the past 240 hour(s))  Blood culture (routine x 2)     Status: None (Preliminary result)   Collection Time: 05/22/16  4:15 PM  Result Value Ref Range Status   Specimen Description BLOOD RIGHT ANTECUBITAL  Final   Special Requests BOTTLES DRAWN AEROBIC AND ANAEROBIC 5CC  Final   Culture NO GROWTH 2 DAYS  Final   Report Status PENDING  Incomplete  MRSA PCR Screening     Status: None   Collection Time: 05/22/16  9:26 PM  Result Value Ref Range Status   MRSA by PCR NEGATIVE NEGATIVE Final    Comment:        The GeneXpert MRSA Assay (FDA approved for NASAL specimens only), is one component of a comprehensive MRSA colonization surveillance program. It is not intended to diagnose MRSA infection nor to guide or monitor treatment for MRSA infections.   Blood culture (routine x 2)     Status: None (Preliminary result)   Collection Time: 05/22/16  9:29 PM  Result Value Ref Range Status   Specimen Description BLOOD BLOOD RIGHT HAND  Final   Special Requests IN PEDIATRIC BOTTLE  4CC EA  Final   Culture NO GROWTH 2 DAYS  Final   Report Status PENDING  Incomplete      Anti-infectives    Start     Dose/Rate Route Frequency Ordered Stop   05/23/16 1000  ciprofloxacin (CIPRO) IVPB 400 mg  Status:   Discontinued     400 mg 200 mL/hr over 60 Minutes Intravenous Every 24 hours 05/22/16 2127 05/23/16 0754       Radiology Studies: Mr Brain Wo Contrast  Result Date: 05/23/2016 CLINICAL DATA:  Progressive weakness EXAM: MRI HEAD WITHOUT CONTRAST MRI CERVICAL SPINE WITHOUT CONTRAST TECHNIQUE: Multiplanar, multiecho pulse sequences of the brain and surrounding structures, and cervical spine, to include the craniocervical junction and cervicothoracic junction, were obtained without intravenous contrast. COMPARISON:  Head CT 05/20/2016 Brain MRI 08/12/2015 FINDINGS: MRI HEAD FINDINGS Brain: No focal diffusion restriction to indicate acute infarct. No intraparenchymal hemorrhage. Unchanged right frontal encephalomalacia. There is multifocal hyperintense T2-weighted signal within the periventricular white matter, most often seen in the setting of chronic microvascular ischemia. No mass lesion or midline shift. No hydrocephalus or extra-axial fluid collection. The midline structures are normal. No age advanced or lobar predominant atrophy. Vascular: Major intracranial arterial and venous sinus flow voids are preserved. Punctate focus of remote microhemorrhage within the right pons. There is hemosiderin deposition along the right frontal convexity. Skull and upper cervical spine: The visualized skull base, calvarium, upper cervical spine and extracranial soft tissues are normal. Sinuses/Orbits: No fluid levels or advanced mucosal thickening. No mastoid effusion. Normal orbits. MRI CERVICAL SPINE FINDINGS Despite efforts by the technologist and patient, motion artifact is present on today's examination and could not be eliminated. This reduces the sensitivity and specificity of the study. Alignment: Normal Vertebrae: There is anterior cervical fusion at C5-C6 in normal alignment with solid osseous fusion. Cord: Normal signal and caliber Posterior Fossa, vertebral arteries, paraspinal tissues: Visualized posterior  fossa is normal. Vertebral artery flow voids are preserved. Normal visualized paraspinal soft tissues. Disc levels: C1-C2: Normal. C2-C3: Normal disc space and facets. No spinal canal or neuroforaminal stenosis. C3-C4: Mild disc  bulge without spinal canal or neural foraminal stenosis. C4-C5: Mild spinal canal stenosis secondary to small disc bulge and mild ligamentum flavum buckling. C5-C6: Postfusion changes with patency of the thecal sac. Susceptibility artifact obscures the right neural foramen. The left neural foramen is widely patent. C6-C7: Normal disc space and facets. No spinal canal or neuroforaminal stenosis. C7-T1: Normal disc space and facets. No spinal canal or neuroforaminal stenosis. IMPRESSION: 1. No acute intracranial abnormality. Unchanged right frontal encephalomalacia at the site of prior meningioma resection. 2. Chronic microvascular ischemia. 3. Motion degraded examination of the cervical spine. Within that limitation, mild spinal canal stenosis at C4-C5. No other spinal canal stenosis or nerve root impingement. Electronically Signed   By: Ulyses Jarred M.D.   On: 05/23/2016 22:47   Mr Cervical Spine Wo Contrast  Result Date: 05/23/2016 CLINICAL DATA:  Progressive weakness EXAM: MRI HEAD WITHOUT CONTRAST MRI CERVICAL SPINE WITHOUT CONTRAST TECHNIQUE: Multiplanar, multiecho pulse sequences of the brain and surrounding structures, and cervical spine, to include the craniocervical junction and cervicothoracic junction, were obtained without intravenous contrast. COMPARISON:  Head CT 05/20/2016 Brain MRI 08/12/2015 FINDINGS: MRI HEAD FINDINGS Brain: No focal diffusion restriction to indicate acute infarct. No intraparenchymal hemorrhage. Unchanged right frontal encephalomalacia. There is multifocal hyperintense T2-weighted signal within the periventricular white matter, most often seen in the setting of chronic microvascular ischemia. No mass lesion or midline shift. No hydrocephalus or  extra-axial fluid collection. The midline structures are normal. No age advanced or lobar predominant atrophy. Vascular: Major intracranial arterial and venous sinus flow voids are preserved. Punctate focus of remote microhemorrhage within the right pons. There is hemosiderin deposition along the right frontal convexity. Skull and upper cervical spine: The visualized skull base, calvarium, upper cervical spine and extracranial soft tissues are normal. Sinuses/Orbits: No fluid levels or advanced mucosal thickening. No mastoid effusion. Normal orbits. MRI CERVICAL SPINE FINDINGS Despite efforts by the technologist and patient, motion artifact is present on today's examination and could not be eliminated. This reduces the sensitivity and specificity of the study. Alignment: Normal Vertebrae: There is anterior cervical fusion at C5-C6 in normal alignment with solid osseous fusion. Cord: Normal signal and caliber Posterior Fossa, vertebral arteries, paraspinal tissues: Visualized posterior fossa is normal. Vertebral artery flow voids are preserved. Normal visualized paraspinal soft tissues. Disc levels: C1-C2: Normal. C2-C3: Normal disc space and facets. No spinal canal or neuroforaminal stenosis. C3-C4: Mild disc bulge without spinal canal or neural foraminal stenosis. C4-C5: Mild spinal canal stenosis secondary to small disc bulge and mild ligamentum flavum buckling. C5-C6: Postfusion changes with patency of the thecal sac. Susceptibility artifact obscures the right neural foramen. The left neural foramen is widely patent. C6-C7: Normal disc space and facets. No spinal canal or neuroforaminal stenosis. C7-T1: Normal disc space and facets. No spinal canal or neuroforaminal stenosis. IMPRESSION: 1. No acute intracranial abnormality. Unchanged right frontal encephalomalacia at the site of prior meningioma resection. 2. Chronic microvascular ischemia. 3. Motion degraded examination of the cervical spine. Within that  limitation, mild spinal canal stenosis at C4-C5. No other spinal canal stenosis or nerve root impingement. Electronically Signed   By: Ulyses Jarred M.D.   On: 05/23/2016 22:47   US Renal  Result Date: 05/22/2016 CLINICAL DATA:  Chronic kidney disease, stage IV. Elevated BUN and creatinine. History of diabetes, hypertension, obesity, and breast cancer. EXAM: RENAL / URINARY TRACT ULTRASOUND COMPLETE COMPARISON:  Ultrasound of the abdomen on 05/14/2013 FINDINGS: Right Kidney: Length: 11.4 cm. Minimal renal parenchymal thinning. No  focal mass or hydronephrosis. Left Kidney: Length: 11.7 cm. Minimal renal parenchymal thinning. No focal mass or hydronephrosis. Bladder: Appears normal for degree of bladder distention. IMPRESSION: 1. Mild parenchymal thinning of both kidneys. 2. No hydronephrosis or focal mass. Electronically Signed   By: Nolon Nations M.D.   On: 05/22/2016 18:24        Scheduled Meds: . amiodarone  100 mg Oral Daily  . carbidopa-levodopa  1 tablet Oral TID  . [START ON 05/26/2016] cloNIDine  0.3 mg Transdermal Q Fri  . enoxaparin (LOVENOX) injection  50 mg Subcutaneous Q24H  . insulin aspart  0-5 Units Subcutaneous QHS  . insulin aspart  0-9 Units Subcutaneous TID WC  . insulin glargine  15 Units Subcutaneous QHS  . lamoTRIgine  200 mg Oral QHS  . [START ON 05/25/2016] levothyroxine  100 mcg Oral QAC breakfast  . metoprolol tartrate  12.5 mg Oral BID  . mometasone-formoterol  2 puff Inhalation BID  . pantoprazole  40 mg Oral Daily  . sodium chloride flush  3 mL Intravenous Q12H   Continuous Infusions: . sodium chloride 75 mL/hr at 05/23/16 1632     LOS: 1 day    Time spent: 25 min   Graham, DO [facility] Pager [phone]  If 7PM-7AM, please contact night-coverage [email or web address] Password [email or web address] 05/24/2016, 3:59 PM

## 2016-05-25 DIAGNOSIS — R531 Weakness: Secondary | ICD-10-CM

## 2016-05-25 DIAGNOSIS — E162 Hypoglycemia, unspecified: Secondary | ICD-10-CM

## 2016-05-25 DIAGNOSIS — J42 Unspecified chronic bronchitis: Secondary | ICD-10-CM

## 2016-05-25 DIAGNOSIS — I10 Essential (primary) hypertension: Secondary | ICD-10-CM

## 2016-05-25 LAB — GLUCOSE, CAPILLARY
Glucose-Capillary: 180 mg/dL — ABNORMAL HIGH (ref 65–99)
Glucose-Capillary: 245 mg/dL — ABNORMAL HIGH (ref 65–99)
Glucose-Capillary: 163 mg/dL — ABNORMAL HIGH (ref 65–99)
Glucose-Capillary: 197 mg/dL — ABNORMAL HIGH (ref 65–99)

## 2016-05-25 MED ORDER — DULOXETINE HCL 60 MG PO CPEP
60.0000 mg | ORAL_CAPSULE | Freq: Every day | ORAL | Status: DC
  Administered 2016-05-25 – 2016-05-26 (×2): 60 mg via ORAL
  Filled 2016-05-25 (×2): qty 1

## 2016-05-25 MED ORDER — ALBUTEROL SULFATE (2.5 MG/3ML) 0.083% IN NEBU: 2.5000 mg | INHALATION_SOLUTION | RESPIRATORY_TRACT | Status: DC | PRN

## 2016-05-25 NOTE — Clinical Social Work Note (Signed)
Clinical Social Work Assessment  Patient Details  Name: Morgan [name] MRN: [id number] Date of Birth: 09-22-1940  Date of referral:  05/25/16               Reason for consult:  Facility Placement                Permission sought to share information with:  Chartered certified accountant granted to share information::  Yes, Verbal Permission Granted  Name::     Morgan [name]::  SNFs  Relationship::  dtr  Contact Information:     Housing/Transportation Living arrangements for the past 2 months:  Single Family Home Source of Information:  Patient, Adult Children Patient Interpreter Needed:  None Criminal Activity/Legal Involvement Pertinent to Current Situation/Hospitalization:  No - Comment as needed Significant Relationships:  Adult Children Lives with:  Spouse Do you feel safe going back to the place where you live?  No Need for family participation in patient care:  No (Coment)  Care giving concerns:  Pt lives at home with spouse but does not have enough physical assistance given current impairment.   Social Worker assessment / plan:  CSW spoke with pt and pt dtr concerning PT recommendation for SNF.  Family had already discussed SNF and pt has been to SNF in the past.  Employment status:  Retired Forensic scientist:  Medicare PT Recommendations:  [place] / Referral to community resources:  [place]  Patient/Family's Response to care:  Patient and family are agreeable to SNF placement and hopeful for placement near home.  Patient/Family's Understanding of and Emotional Response to Diagnosis, Current Treatment, and Prognosis:  No questions or concerns at this time- pt is familiar with this process and hopeful she'll recovery quickly.  Emotional Assessment Appearance:  Appears stated age Attitude/Demeanor/Rapport:    Affect (typically observed):  Appropriate Orientation:  Oriented to Situation, Oriented to  Time,  Oriented to Place, Oriented to Self Alcohol / Substance use:  Not Applicable Psych involvement (Current and /or in the community):  No (Comment)  Discharge Needs  Concerns to be addressed:  Care Coordination Readmission within the last 30 days:  No Current discharge risk:  Physical Impairment Barriers to Discharge:  Continued Medical Work up   Morgan Ny, LCSW 05/25/2016, 12:02 PM

## 2016-05-25 NOTE — Progress Notes (Signed)
Inpatient Diabetes Program Recommendations  AACE/ADA: New Consensus Statement on Inpatient Glycemic Control (2015)  Target Ranges:  Prepandial:   less than 140 mg/dL      Peak postprandial:   less than 180 mg/dL (1-2 hours)      Critically ill patients:  140 - 180 mg/dL   Results for MISTEE, SOLIMAN (MRN [id number]) as of 05/25/2016 13:05  Ref. Range 05/24/2016 01:01 05/24/2016 04:18 05/24/2016 08:42 05/24/2016 12:17 05/24/2016 17:44 05/24/2016 21:19 05/25/2016 09:02 05/25/2016 12:20  Glucose-Capillary Latest Ref Range: 65 - 99 mg/dL 199 (H) 219 (H) 212 (H) 267 (H) 311 (H) 198 (H) 180 (H) 245 (H)  Review of Glycemic Control  Diabetes history: DM2 Outpatient Diabetes medications: Victoza 1.8 mg daily, Humulin R U500 10 units + 1 at Breakfast and 11 units +1 at supper  Current orders for Inpatient glycemic control: Lantus 15 units daily, Novolog 0-9 units TID with meals, Novolog 0-5 units QHS  Inpatient Diabetes Program Recommendations:  Insulin - Meal Coverage: Please consider ordering Novolog 4 units TID with meals for meal coverage if patient eats at least 50% of meals. Outpatient DM medication regimen: Recommend patient be discharged on Lantus and Novolog regimen similar to what is ordered as an inpatient and follow up with Endocrinologist.  Thanks, Barnie Alderman, RN, MSN, CDE Diabetes Coordinator Inpatient Diabetes Program [phone] (Team Pager from 8am to 5pm)

## 2016-05-25 NOTE — Progress Notes (Signed)
PROGRESS NOTE        PATIENT DETAILS Name: GENAE STRINE Age: 76 y.o. Sex: female Date of Birth: 1940-05-14 Admit Date: 05/22/2016 Admitting Physician Waldemar Dickens, MD EVO:[email or web address],GHWE, MD  Brief Narrative: Patient is a 76 y.o. female with history of meningioma excision in January 2016-chronic debility (wheelchair/walker use at home-minimally ambulatory at baseline), hypothyroidism, seizures admitted for elevation of progressive weakness and falls. Found to have severe hypoglycemia. Subsequently admitted for further evaluation and treatment.  Subjective: Lying comfortably in bed-daughter at bedside. CBGs stable.  Assessment/Plan: Hypoglycemia: Resolved with supportive measures. Hemoglobin A1c 6.8. Was on insulin U 500 and Victoza-now on Lantus and SSI with stable CBG's.  Worsening generalized weakness: Per patient/family-she is minimally ambulatory and has had frequent falls. She follows with neurology for gait abnormality with mild parkinsonian features. She uses a wheelchair and walker at baseline. Over the past few weeks-has had worsening generalized weakness.She has approximately 5/5 strength in the bilateral lower extremities with preserved reflexes. Sensation is also intact. I suspect worsening weakness is secondary to to worsening debility due to hypoglycemia/underlying peripheral neuropathy. Plans are to discharge to SNF for subacute PT.  Insulin-dependent type 2 diabetes: CBGs currently stable with 15 units of Lantus and SSI-given prior history of recurrent hypoglycemia-would allow some mild permissive hyperglycemia in order to prevent hypoglycemic episodes.  History of peripheral neuropathy: Probably related to long-standing diabetes-resume Cymbalta  History of gait abnormality with mild parkinsonian features: Follows with neurology-Dr. Krista Blue as outpatient. Continue Sinemet.  History of seizures: Continue Lamictal  History of paroxysmal atrial  flutter/proximal atrial fibrillation: Rate controlled with amiodarone and metoprolol, not a long-term candidate for anticoagulation (see outpatient cardiology note). Has a history of frequent falls.CHA2DVAS2C score of 4  Hypertension: Controlled, continue with clonidine, metoprolol.  Hypothyroidism: TSH low-Synthroid decreased to 100 g (prior home dose of 137 g). Repeat TSH in next 3-6 months and adjust accordingly.  Chronic kidney disease stage IV: Creatinine close to usual baseline. Monitor electrolytes periodically  COPD: Lungs are clear-this appears stable, continue current bronchodilator regimen  Mild transaminitis: Chronic issue-stable for outpatient follow-up.  History of depression: Resume Abilify over the next few days.  History of right frontal meningioma, status post resection on May 08 2014  DVT Prophylaxis: Prophylactic Lovenox   Code Status: Full code   Family Communication: Daughter at bedside  Disposition Plan: Remain inpatient- SNF on discharge-likely 2/16  Antimicrobial agents: Anti-infectives    Start     Dose/Rate Route Frequency Ordered Stop   05/23/16 1000  ciprofloxacin (CIPRO) IVPB 400 mg  Status:  Discontinued     400 mg 200 mL/hr over 60 Minutes Intravenous Every 24 hours 05/22/16 2127 05/23/16 0754      Procedures: None  CONSULTS:  None  Time spent: 25- minutes-Greater than 50% of this time was spent in counseling, explanation of diagnosis, planning of further management, and coordination of care.  MEDICATIONS: Scheduled Meds: . amiodarone  100 mg Oral Daily  . carbidopa-levodopa  1 tablet Oral TID  . [START ON 05/26/2016] cloNIDine  0.3 mg Transdermal Q Fri  . DULoxetine  60 mg Oral Daily  . enoxaparin (LOVENOX) injection  50 mg Subcutaneous Q24H  . insulin aspart  0-5 Units Subcutaneous QHS  . insulin aspart  0-9 Units Subcutaneous TID WC  . insulin glargine  15 Units Subcutaneous QHS  . lamoTRIgine  200 mg Oral QHS  .  levothyroxine  100 mcg Oral QAC breakfast  . metoprolol tartrate  12.5 mg Oral BID  . mometasone-formoterol  2 puff Inhalation BID  . pantoprazole  40 mg Oral Daily  . sodium chloride flush  3 mL Intravenous Q12H   Continuous Infusions: PRN Meds:.acetaminophen **OR** acetaminophen, ondansetron **OR** ondansetron (ZOFRAN) IV   PHYSICAL EXAM: Vital signs: Vitals:   05/24/16 1618 05/24/16 2113 05/25/16 0558 05/25/16 1006  BP: (!) 148/71 (!) 152/53 (!) 146/46   Pulse: 69 67 64   Resp: 20 20 20    Temp: 98.2 F (36.8 C) 98.1 F (36.7 C) 98 F (36.7 C)   TempSrc: Oral Oral Oral   SpO2: 98% 96% 97% 96%  Weight:  105.5 kg (232 lb 9.4 oz) 104.9 kg (231 lb 4.2 oz)   Height:  5' 5"  (1.651 m)     Filed Weights   05/24/16 0500 05/24/16 2113 05/25/16 0558  Weight: 102.6 kg (226 lb 3.1 oz) 105.5 kg (232 lb 9.4 oz) 104.9 kg (231 lb 4.2 oz)   Body mass index is 38.48 kg/m.   General appearance :Awake, alert, not in any distress. Speech Clear.  Eyes:, pupils equally reactive to light and accomodation,no scleral icterus. HEENT: Atraumatic and Normocephalic Neck: supple, no JVD. No cervical lymphadenopathy. Resp:Good air entry bilaterally, no added sounds  CVS: S1 S2 regular, no murmurs.  GI: Bowel sounds present, Non tender and not distended with no gaurding, rigidity or rebound.No organomegaly Extremities: B/L Lower Ext shows no edema, both legs are warm to touch Neurology:  speech clear,Non focal, sensation is grossly intact. Psychiatric: Normal judgment and insight. Alert and oriented x 3. Normal mood. Musculoskeletal:No digital cyanosis Skin:No Rash, warm and dry Wounds:N/A  I have personally reviewed following labs and imaging studies  LABORATORY DATA: CBC:  Recent Labs Lab 05/20/16 1648 05/20/16 2127 05/22/16 1201 05/22/16 1439 05/23/16 0351 05/24/16 0201  WBC 7.5  --  8.4 10.6* 8.5 6.5  HGB 12.3 11.9* 11.9* 11.8* 10.3* 9.8*  HCT 38.1 35.0* 37.5 37.3 32.7* 30.9*  MCV  90.7  --  91.2 91.2 90.6 90.4  PLT 148*  --  223 227 184 322    Basic Metabolic Panel:  Recent Labs Lab 05/20/16 1648 05/20/16 2127 05/22/16 1201 05/22/16 1439 05/23/16 0012 05/23/16 0351 05/24/16 0201  NA 135 139 140 140  --  136 136  K 5.6* 4.0 3.6 3.6  --  3.4* 4.0  CL 97* 99* 105 101  --  102 101  CO2 25  --  25 28  --  25 25  GLUCOSE 106* 108* 80 36*  --  94 214*  BUN 30* 33* 35* 35*  --  31* 28*  CREATININE 2.01* 1.80* 1.95* 1.93*  --  1.74* 1.69*  CALCIUM 9.3  --  9.5 9.5  --  8.7* 8.5*  MG  --   --   --   --  2.1  --   --   PHOS  --   --   --   --  4.6  --   --     GFR: Estimated Creatinine Clearance: 34.6 mL/min (by C-G formula based on SCr of 1.69 mg/dL (H)).  Liver Function Tests:  Recent Labs Lab 05/20/16 1907 05/22/16 1439 05/23/16 0351 05/24/16 0201  AST 53* 62* 47* 39  ALT 19 35 33 17  ALKPHOS 168* 172* 140* 137*  BILITOT 1.0 1.1 1.0 0.8  PROT 6.5 6.5 5.5* 5.2*  ALBUMIN 3.5 3.8 3.1* 2.9*   No results for input(s): LIPASE, AMYLASE in the last 168 hours.  Recent Labs Lab 05/20/16 1907 05/22/16 1554  AMMONIA 20 35    Coagulation Profile: No results for input(s): INR, PROTIME in the last 168 hours.  Cardiac Enzymes:  Recent Labs Lab 05/22/16 1439  CKTOTAL 72    BNP (last 3 results) No results for input(s): PROBNP in the last 8760 hours.  HbA1C:  Recent Labs  05/22/16 1611  HGBA1C 6.8*    CBG:  Recent Labs Lab 05/24/16 1217 05/24/16 1744 05/24/16 2119 05/25/16 0902 05/25/16 1220  GLUCAP 267* 311* 198* 180* 245*    Lipid Profile: No results for input(s): CHOL, HDL, LDLCALC, TRIG, CHOLHDL, LDLDIRECT in the last 72 hours.  Thyroid Function Tests:  Recent Labs  05/23/16 0012  FREET4 1.68*    Anemia Panel: No results for input(s): VITAMINB12, FOLATE, FERRITIN, TIBC, IRON, RETICCTPCT in the last 72 hours.  Urine analysis:    Component Value Date/Time   COLORURINE YELLOW 05/22/2016 1559   APPEARANCEUR CLEAR  05/22/2016 1559   LABSPEC 1.014 05/22/2016 1559   PHURINE 5.0 05/22/2016 1559   GLUCOSEU NEGATIVE 05/22/2016 1559   HGBUR MODERATE (A) 05/22/2016 1559   BILIRUBINUR NEGATIVE 05/22/2016 1559   KETONESUR NEGATIVE 05/22/2016 1559   PROTEINUR 100 (A) 05/22/2016 1559   UROBILINOGEN 0.2 01/15/2015 2141   NITRITE NEGATIVE 05/22/2016 1559   LEUKOCYTESUR NEGATIVE 05/22/2016 1559    Sepsis Labs: Lactic Acid, Venous    Component Value Date/Time   LATICACIDVEN 2.10 (HH) 05/22/2016 1511    MICROBIOLOGY: Recent Results (from the past 240 hour(s))  Blood culture (routine x 2)     Status: None (Preliminary result)   Collection Time: 05/22/16  4:15 PM  Result Value Ref Range Status   Specimen Description BLOOD RIGHT ANTECUBITAL  Final   Special Requests BOTTLES DRAWN AEROBIC AND ANAEROBIC 5CC  Final   Culture NO GROWTH 2 DAYS  Final   Report Status PENDING  Incomplete  MRSA PCR Screening     Status: None   Collection Time: 05/22/16  9:26 PM  Result Value Ref Range Status   MRSA by PCR NEGATIVE NEGATIVE Final    Comment:        The GeneXpert MRSA Assay (FDA approved for NASAL specimens only), is one component of a comprehensive MRSA colonization surveillance program. It is not intended to diagnose MRSA infection nor to guide or monitor treatment for MRSA infections.   Blood culture (routine x 2)     Status: None (Preliminary result)   Collection Time: 05/22/16  9:29 PM  Result Value Ref Range Status   Specimen Description BLOOD BLOOD RIGHT HAND  Final   Special Requests IN PEDIATRIC BOTTLE  4CC EA  Final   Culture NO GROWTH 2 DAYS  Final   Report Status PENDING  Incomplete    RADIOLOGY STUDIES/RESULTS: Dg Chest 2 View  Result Date: 05/20/2016 CLINICAL DATA:  Cough.  General lethargy. EXAM: CHEST  2 VIEW COMPARISON:  May 17, 2016 FINDINGS: Scarring in the lateral left lung base. Cardiomegaly. The hila and mediastinum are normal. No pulmonary nodules, masses, or focal  infiltrates. IMPRESSION: Scarring in the lateral left lung base. Electronically Signed   By: Dorise Bullion III M.D   On: 05/20/2016 20:20   Ct Head Wo Contrast  Result Date: 05/20/2016 CLINICAL DATA:  Weakness and tremors EXAM: CT HEAD WITHOUT CONTRAST TECHNIQUE: Contiguous axial images were obtained from the base of the  skull through the vertex without intravenous contrast. COMPARISON:  08/12/2015 FINDINGS: Brain: No evidence of acute infarction, hemorrhage, hydrocephalus, extra-axial collection or mass lesion/mass effect. Encephalomalacia is noted in the right frontal lobe consistent with the prior resection. Vascular: No hyperdense vessel or unexpected calcification. Skull: Postsurgical changes are noted in the right frontoparietal region stable from the prior exam. Sinuses/Orbits: No acute finding. Other: None. IMPRESSION: Postsurgical changes.  No acute abnormality noted. Electronically Signed   By: Inez Catalina M.D.   On: 05/20/2016 19:44   Mr Brain Wo Contrast  Result Date: 05/23/2016 CLINICAL DATA:  Progressive weakness EXAM: MRI HEAD WITHOUT CONTRAST MRI CERVICAL SPINE WITHOUT CONTRAST TECHNIQUE: Multiplanar, multiecho pulse sequences of the brain and surrounding structures, and cervical spine, to include the craniocervical junction and cervicothoracic junction, were obtained without intravenous contrast. COMPARISON:  Head CT 05/20/2016 Brain MRI 08/12/2015 FINDINGS: MRI HEAD FINDINGS Brain: No focal diffusion restriction to indicate acute infarct. No intraparenchymal hemorrhage. Unchanged right frontal encephalomalacia. There is multifocal hyperintense T2-weighted signal within the periventricular white matter, most often seen in the setting of chronic microvascular ischemia. No mass lesion or midline shift. No hydrocephalus or extra-axial fluid collection. The midline structures are normal. No age advanced or lobar predominant atrophy. Vascular: Major intracranial arterial and venous sinus flow  voids are preserved. Punctate focus of remote microhemorrhage within the right pons. There is hemosiderin deposition along the right frontal convexity. Skull and upper cervical spine: The visualized skull base, calvarium, upper cervical spine and extracranial soft tissues are normal. Sinuses/Orbits: No fluid levels or advanced mucosal thickening. No mastoid effusion. Normal orbits. MRI CERVICAL SPINE FINDINGS Despite efforts by the technologist and patient, motion artifact is present on today's examination and could not be eliminated. This reduces the sensitivity and specificity of the study. Alignment: Normal Vertebrae: There is anterior cervical fusion at C5-C6 in normal alignment with solid osseous fusion. Cord: Normal signal and caliber Posterior Fossa, vertebral arteries, paraspinal tissues: Visualized posterior fossa is normal. Vertebral artery flow voids are preserved. Normal visualized paraspinal soft tissues. Disc levels: C1-C2: Normal. C2-C3: Normal disc space and facets. No spinal canal or neuroforaminal stenosis. C3-C4: Mild disc bulge without spinal canal or neural foraminal stenosis. C4-C5: Mild spinal canal stenosis secondary to small disc bulge and mild ligamentum flavum buckling. C5-C6: Postfusion changes with patency of the thecal sac. Susceptibility artifact obscures the right neural foramen. The left neural foramen is widely patent. C6-C7: Normal disc space and facets. No spinal canal or neuroforaminal stenosis. C7-T1: Normal disc space and facets. No spinal canal or neuroforaminal stenosis. IMPRESSION: 1. No acute intracranial abnormality. Unchanged right frontal encephalomalacia at the site of prior meningioma resection. 2. Chronic microvascular ischemia. 3. Motion degraded examination of the cervical spine. Within that limitation, mild spinal canal stenosis at C4-C5. No other spinal canal stenosis or nerve root impingement. Electronically Signed   By: Ulyses Jarred M.D.   On: 05/23/2016 22:47     Mr Cervical Spine Wo Contrast  Result Date: 05/23/2016 CLINICAL DATA:  Progressive weakness EXAM: MRI HEAD WITHOUT CONTRAST MRI CERVICAL SPINE WITHOUT CONTRAST TECHNIQUE: Multiplanar, multiecho pulse sequences of the brain and surrounding structures, and cervical spine, to include the craniocervical junction and cervicothoracic junction, were obtained without intravenous contrast. COMPARISON:  Head CT 05/20/2016 Brain MRI 08/12/2015 FINDINGS: MRI HEAD FINDINGS Brain: No focal diffusion restriction to indicate acute infarct. No intraparenchymal hemorrhage. Unchanged right frontal encephalomalacia. There is multifocal hyperintense T2-weighted signal within the periventricular white matter, most often seen in the  setting of chronic microvascular ischemia. No mass lesion or midline shift. No hydrocephalus or extra-axial fluid collection. The midline structures are normal. No age advanced or lobar predominant atrophy. Vascular: Major intracranial arterial and venous sinus flow voids are preserved. Punctate focus of remote microhemorrhage within the right pons. There is hemosiderin deposition along the right frontal convexity. Skull and upper cervical spine: The visualized skull base, calvarium, upper cervical spine and extracranial soft tissues are normal. Sinuses/Orbits: No fluid levels or advanced mucosal thickening. No mastoid effusion. Normal orbits. MRI CERVICAL SPINE FINDINGS Despite efforts by the technologist and patient, motion artifact is present on today's examination and could not be eliminated. This reduces the sensitivity and specificity of the study. Alignment: Normal Vertebrae: There is anterior cervical fusion at C5-C6 in normal alignment with solid osseous fusion. Cord: Normal signal and caliber Posterior Fossa, vertebral arteries, paraspinal tissues: Visualized posterior fossa is normal. Vertebral artery flow voids are preserved. Normal visualized paraspinal soft tissues. Disc levels: C1-C2:  Normal. C2-C3: Normal disc space and facets. No spinal canal or neuroforaminal stenosis. C3-C4: Mild disc bulge without spinal canal or neural foraminal stenosis. C4-C5: Mild spinal canal stenosis secondary to small disc bulge and mild ligamentum flavum buckling. C5-C6: Postfusion changes with patency of the thecal sac. Susceptibility artifact obscures the right neural foramen. The left neural foramen is widely patent. C6-C7: Normal disc space and facets. No spinal canal or neuroforaminal stenosis. C7-T1: Normal disc space and facets. No spinal canal or neuroforaminal stenosis. IMPRESSION: 1. No acute intracranial abnormality. Unchanged right frontal encephalomalacia at the site of prior meningioma resection. 2. Chronic microvascular ischemia. 3. Motion degraded examination of the cervical spine. Within that limitation, mild spinal canal stenosis at C4-C5. No other spinal canal stenosis or nerve root impingement. Electronically Signed   By: Ulyses Jarred M.D.   On: 05/23/2016 22:47   US Renal  Result Date: 05/22/2016 CLINICAL DATA:  Chronic kidney disease, stage IV. Elevated BUN and creatinine. History of diabetes, hypertension, obesity, and breast cancer. EXAM: RENAL / URINARY TRACT ULTRASOUND COMPLETE COMPARISON:  Ultrasound of the abdomen on 05/14/2013 FINDINGS: Right Kidney: Length: 11.4 cm. Minimal renal parenchymal thinning. No focal mass or hydronephrosis. Left Kidney: Length: 11.7 cm. Minimal renal parenchymal thinning. No focal mass or hydronephrosis. Bladder: Appears normal for degree of bladder distention. IMPRESSION: 1. Mild parenchymal thinning of both kidneys. 2. No hydronephrosis or focal mass. Electronically Signed   By: Nolon Nations M.D.   On: 05/22/2016 18:24     LOS: 2 days   Oren Binet, MD  [facility] Pager:[phone]  If 7PM-7AM, please contact night-coverage [email or web address] Password [email or web address] 05/25/2016, 1:51 PM

## 2016-05-25 NOTE — NC FL2 (Signed)
[place] LEVEL OF CARE SCREENING TOOL     IDENTIFICATION  Patient Name: Morgan [name] Birthdate: 05/19/1940 Sex: female Admission Date (Current Location): 05/22/2016  [facility] and [place] Number:  [employer] and Address:  [facility] [place] [address] [place], [place] [address]      Provider Number: [id number]  Attending Physician Name and Address:  Jonetta Osgood, MD  Relative Name and Phone Number:       Current Level of Care: Hospital Recommended Level of Care: [place] Prior Approval Number:    Date Approved/Denied:   PASRR Number: [id number]  Discharge Plan: SNF    Current Diagnoses: Patient Active Problem List   Diagnosis Date Noted  . Hypoglycemia 05/23/2016  . Generalized weakness 05/22/2016  . Acute metabolic encephalopathy due to hypoglycemia 05/22/2016  . Transaminitis 05/22/2016  . CKD (chronic kidney disease) stage 4, GFR 15-29 ml/min (HCC) 05/22/2016  . Urinary retention 05/22/2016  . Parkinsonism ([place]) 02/24/2016  . Delirium 08/11/2015  . Seizure disorder ([place]) 04/27/2015  . Atrial flutter ([place]) 03/15/2015  . Mitral regurgitation 03/15/2015  . Cognitive impairment 07/23/2014  . Peripheral neuropathy with Parkisonian features   . Essential hypertension   . Gastroesophageal reflux disease without esophagitis   . History of Meningioma    . Primary gout   . Acute encephalopathy 05/03/2014  . Abnormal head CT   . Left hemiparesis ([place])   . 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 Acquired autoimmune hypothyroidism   . Thyroiditis, autoimmune   . Abnormal liver function tests   . Sleep apnea, obstructive   . History of gastroesophageal reflux (GERD)   . Depression   . Type 2 diabetes mellitus with polyneuropathy ([place])   . COPD (chronic obstructive pulmonary disease) ([place])   . Goiter   . Vertigo   . Gout   . Mixed hyperlipidemia 08/01/2010  . Obesity 08/01/2010     Orientation RESPIRATION BLADDER Height & Weight     Self, Time, Situation, Place  Normal Continent Weight: 231 lb 4.2 oz (104.9 kg) Height:  5' 5"  (165.1 cm)  BEHAVIORAL SYMPTOMS/MOOD NEUROLOGICAL BOWEL NUTRITION STATUS      Continent Diet  AMBULATORY STATUS COMMUNICATION OF NEEDS Skin   Extensive Assist Verbally Normal                       Personal Care Assistance Level of Assistance  Bathing, Dressing, Feeding Bathing Assistance: Maximum assistance Feeding assistance: Limited assistance Dressing Assistance: Maximum assistance     Functional Limitations Info             SPECIAL CARE FACTORS FREQUENCY  PT (By licensed PT), OT (By licensed OT)     PT Frequency: 5/wk OT Frequency: 5/wk            Contractures      Additional Factors Info  Code Status, Allergies, Psychotropic, Insulin Sliding Scale Code Status Info: DNR Allergies Info: Latex, Exenatide, Metformin, Penicillins, Sulfa Antibiotics Psychotropic Info: cymbalta Insulin Sliding Scale Info: 5/day       Current Medications (05/25/2016):  This is the current hospital active medication list Current Facility-Administered Medications  Medication Dose Route Frequency Provider Last Rate Last Dose  . acetaminophen (TYLENOL) tablet 650 mg  650 mg Oral Q6H PRN Samella Parr, NP       Or  . acetaminophen (TYLENOL) suppository 650 mg  650 mg Rectal Q6H PRN Samella Parr, NP      .  amiodarone (PACERONE) tablet 100 mg  100 mg Oral Daily Geradine Girt, DO   100 mg at 05/24/16 0915  . carbidopa-levodopa (SINEMET IR) 25-100 MG per tablet immediate release 1 tablet  1 tablet Oral TID Geradine Girt, DO   1 tablet at 05/24/16 2236  . [START ON 05/26/2016] cloNIDine (CATAPRES - Dosed in mg/24 hr) patch 0.3 mg  0.3 mg Transdermal Q Fri Samella Parr, NP      . DULoxetine (CYMBALTA) DR capsule 60 mg  60 mg Oral Daily Shanker Kristeen Mans, MD      . enoxaparin (LOVENOX) injection 50 mg  50 mg Subcutaneous Q24H  Cecilio Asper Batchelder, RPH   50 mg at 05/24/16 2235  . insulin aspart (novoLOG) injection 0-5 Units  0-5 Units Subcutaneous QHS Jessica U Vann, DO      . insulin aspart (novoLOG) injection 0-9 Units  0-9 Units Subcutaneous TID WC Geradine Girt, DO   7 Units at 05/24/16 1833  . insulin glargine (LANTUS) injection 15 Units  15 Units Subcutaneous QHS Geradine Girt, DO   15 Units at 05/24/16 2307  . lamoTRIgine (LAMICTAL) tablet 200 mg  200 mg Oral QHS Geradine Girt, DO   200 mg at 05/24/16 2236  . levothyroxine (SYNTHROID, LEVOTHROID) tablet 100 mcg  100 mcg Oral QAC breakfast Geradine Girt, DO      . metoprolol tartrate (LOPRESSOR) tablet 12.5 mg  12.5 mg Oral BID Geradine Girt, DO   12.5 mg at 05/24/16 2236  . mometasone-formoterol (DULERA) 100-5 MCG/ACT inhaler 2 puff  2 puff Inhalation BID Geradine Girt, DO   2 puff at 05/24/16 2313  . ondansetron (ZOFRAN) tablet 4 mg  4 mg Oral Q6H PRN Samella Parr, NP       Or  . ondansetron [facility]) injection 4 mg  4 mg Intravenous Q6H PRN Samella Parr, NP      . pantoprazole (PROTONIX) EC tablet 40 mg  40 mg Oral Daily Kimberly B Hammons, RPH   40 mg at 05/24/16 1235  . sodium chloride flush (NS) 0.9 % injection 3 mL  3 mL Intravenous Q12H Samella Parr, NP   3 mL at 05/24/16 2620     Discharge Medications: Please see discharge summary for a list of discharge medications.  Relevant Imaging Results:  Relevant Lab Results:   Additional Information SS#: [id number]  Jorge Ny, LCSW

## 2016-05-26 DIAGNOSIS — G6289 Other specified polyneuropathies: Secondary | ICD-10-CM

## 2016-05-26 DIAGNOSIS — R740 Nonspecific elevation of levels of transaminase and lactic acid dehydrogenase [LDH]: Secondary | ICD-10-CM

## 2016-05-26 DIAGNOSIS — G9341 Metabolic encephalopathy: Secondary | ICD-10-CM

## 2016-05-26 LAB — GLUCOSE, CAPILLARY
GLUCOSE-CAPILLARY: 142 mg/dL — AB (ref 65–99)
Glucose-Capillary: 173 mg/dL — ABNORMAL HIGH (ref 65–99)
Glucose-Capillary: 217 mg/dL — ABNORMAL HIGH (ref 65–99)

## 2016-05-26 MED ORDER — INSULIN GLARGINE 100 UNIT/ML ~~LOC~~ SOLN: 15.0000 [IU] | Freq: Every day | SUBCUTANEOUS | 11 refills | Status: DC

## 2016-05-26 MED ORDER — INSULIN ASPART 100 UNIT/ML ~~LOC~~ SOLN: SUBCUTANEOUS | 11 refills | Status: DC

## 2016-05-26 MED ORDER — LEVOTHYROXINE SODIUM 100 MCG PO TABS: 100.0000 ug | ORAL_TABLET | Freq: Every day | ORAL | Status: DC

## 2016-05-26 MED ORDER — AMLODIPINE BESYLATE 5 MG PO TABS
5.0000 mg | ORAL_TABLET | Freq: Once | ORAL | Status: AC
  Administered 2016-05-26: 5 mg via ORAL
  Filled 2016-05-26: qty 1

## 2016-05-26 MED ORDER — HYDRALAZINE HCL 20 MG/ML IJ SOLN
10.0000 mg | Freq: Four times a day (QID) | INTRAMUSCULAR | Status: DC | PRN
  Filled 2016-05-26: qty 1

## 2016-05-26 MED ORDER — ALBUTEROL SULFATE (2.5 MG/3ML) 0.083% IN NEBU: 2.5000 mg | INHALATION_SOLUTION | RESPIRATORY_TRACT | 12 refills | Status: DC | PRN

## 2016-05-26 NOTE — Care Management Important Message (Signed)
Important Message  Patient Details  Name: Morgan [name] MRN: [id number] Date of Birth: Feb 27, 1941   Medicare Important Message Given:  Yes    Ramonita Koenig [place] 05/26/2016, 1:06 PM

## 2016-05-26 NOTE — Progress Notes (Signed)
Patient will discharge to [facility] Anticipated discharge date: 2/16 Family notified: spouse at bedside Transportation by family Report #: [phone] ask for 100 hall RN   CSW signing off.  Jorge Ny, LCSW Clinical Social Worker [phone]

## 2016-05-26 NOTE — Progress Notes (Signed)
Nsg Discharge Note  Admit Date:  05/22/2016 Discharge date: 05/26/2016   Zollie Scale to be D/C'd Skilled nursing facility per MD order.  AVS completed.  Copy for chart, and copy for patient signed, and dated. Patient/caregiver able to verbalize understanding.  Discharge Medication: Allergies as of 05/26/2016      Reactions   Latex Rash   Exenatide Rash   Metformin Rash   RENAL INSUFFICIENCY-takes low dose   Penicillins Rash   Sulfa Antibiotics Other (See Comments)   Unknown allergic reaction      Medication List    STOP taking these medications   ciprofloxacin 500 MG tablet Commonly known as:  CIPRO   HUMULIN R 500 UNIT/ML injection Generic drug:  insulin regular human CONCENTRATED   liraglutide 18 MG/3ML Sopn     TAKE these medications   acetaminophen 325 MG tablet Commonly known as:  TYLENOL Take 650 mg by mouth every 6 (six) hours as needed for mild pain.   albuterol (2.5 MG/3ML) 0.083% nebulizer solution Commonly known as:  PROVENTIL Take 3 mLs (2.5 mg total) by nebulization every 2 (two) hours as needed for shortness of breath.   amiodarone 200 MG tablet Commonly known as:  PACERONE TAKE 1 TABLET (200 MG TOTAL) BY MOUTH DAILY. What changed:  how much to take  how to take this  when to take this  additional instructions   antiseptic oral rinse Liqd 15 mLs by Mouth Rinse route at bedtime.   ARIPiprazole 5 MG tablet Commonly known as:  ABILIFY Take 5 mg by mouth every evening.   CALCIUM 600+D 600-800 MG-UNIT Tabs Generic drug:  Calcium Carb-Cholecalciferol Take 1 tablet by mouth daily.   carbidopa-levodopa 25-100 MG tablet Commonly known as:  SINEMET Take 1 tablet by mouth 3 (three) times daily.   cloNIDine 0.3 mg/24hr patch Commonly known as:  CATAPRES - Dosed in mg/24 hr Place 0.3 mg onto the skin every Friday.   DULoxetine 60 MG capsule Commonly known as:  CYMBALTA Take 60 mg by mouth 2 (two) times daily.   furosemide 40 MG  tablet Commonly known as:  LASIX Take 40 mg by mouth daily.   insulin aspart 100 UNIT/ML injection Commonly known as:  novoLOG 0-9 Units, Subcutaneous, 3 times daily with meals CBG < 70: implement hypoglycemia protocol CBG 70 - 120: 0 units CBG 121 - 150: 1 unit CBG 151 - 200: 2 units CBG 201 - 250: 3 units CBG 251 - 300: 5 units CBG 301 - 350: 7 units CBG 351 - 400: 9 units CBG > 400: call MD   insulin glargine 100 UNIT/ML injection Commonly known as:  LANTUS Inject 0.15 mLs (15 Units total) into the skin at bedtime.   LamoTRIgine XR 200 MG Tb24 Take 1 tablet (200 mg total) by mouth at bedtime.   levothyroxine 100 MCG tablet Commonly known as:  SYNTHROID, LEVOTHROID Take 1 tablet (100 mcg total) by mouth daily before breakfast. What changed:  medication strength  how much to take   magnesium hydroxide 400 MG/5ML suspension Commonly known as:  MILK OF MAGNESIA Take 30 mLs by mouth daily as needed for mild constipation.   metoprolol tartrate 25 MG tablet Commonly known as:  LOPRESSOR Take 12.5 mg by mouth 2 (two) times daily.   mometasone-formoterol 100-5 MCG/ACT Aero Commonly known as:  DULERA Inhale 2 puffs into the lungs 2 (two) times daily.   nystatin powder Commonly known as:  MYCOSTATIN/NYSTOP Apply 1 g topically daily as needed (  irritation).   pantoprazole 40 MG tablet Commonly known as:  PROTONIX Take 40 mg by mouth 2 (two) times daily.   senna 8.6 MG Tabs tablet Commonly known as:  SENOKOT Take 2 tablets by mouth at bedtime.       Discharge Assessment: Vitals:   05/26/16 1404 05/26/16 1519  BP: (!) 176/70 (!) 185/76  Pulse: 63   Resp: 16   Temp: 97.4 F (36.3 C)    Skin clean, dry and intact without evidence of skin break down, no evidence of skin tears noted. MASD in skin folds  IV catheter discontinued intact. Site without signs and symptoms of complications - no redness or edema noted at insertion site, patient denies c/o pain - only slight  tenderness at site.  Dressing with slight pressure applied.  D/c Instructions-Education: Discharge instructions given to patient/family with verbalized understanding. D/c education completed with patient/family including follow up instructions, medication list, d/c activities limitations if indicated, with other d/c instructions as indicated by MD - patient able to verbalize understanding, all questions fully answered. Patient instructed to return to ED, call 911, or call MD for any changes in condition.  Patient escorted via [place], and D/C home via private auto.  Travonta Gill Margaretha Sheffield, RN 05/26/2016 4:44 PM

## 2016-05-26 NOTE — Discharge Summary (Signed)
PATIENT DETAILS Name: Morgan [name] Age: 76 y.o. Sex: female Date of Birth: 11/05/1940 MRN: [id number]. Admitting Physician: Waldemar Dickens, MD TGP:QDIYME,BRAX, MD  Admit Date: 05/22/2016 Discharge date: 05/26/2016  Recommendations for Outpatient Follow-up:  1. Follow up with PCP in 1-2 weeks 2. Please obtain BMP/CBC in one week 3. Has mildly elevated transaminitis-please follow LFTs periodically 4. Synthroid dose decreased to 100 g-please repeat TSH in the next 3-6 months 5. Please ensure follow-up with neurologist-Dr. Krista Blue  Admitted From:  Home  Disposition: SNF   Home Health: No  Equipment/Devices: None  Discharge Condition: Stable  CODE STATUS:  DNR  Diet recommendation:  Heart Healthy / Carb Modified  Brief Summary: See H&P, Labs, Consult and Test reports for all details in brief, Patient is a 76 y.o. female with history of meningioma excision in January 2016-chronic debility (wheelchair/walker use at home-minimally ambulatory at baseline), hypothyroidism, seizures admitted for elevation of progressive weakness and falls. Found to have severe hypoglycemia. Subsequently admitted for further evaluation and treatment.  Brief Hospital Course: Hypoglycemia: Resolved with supportive measures. Hemoglobin A1c 6.8. Was on insulin U 500 and Victoza-now on Lantus and SSI with stable CBG's.  Worsening generalized weakness: Per patient/family-she is minimally ambulatory and has had frequent falls. She follows with neurology for gait abnormality with mild parkinsonian features. She uses a wheelchair and walker at baseline. Over the past few weeks-has had worsening generalized weakness.She has approximately 5/5 strength in the bilateral lower extremities with preserved reflexes. Sensation is also intact. I suspect worsening weakness is secondary to to worsening debility due to hypoglycemia/underlying peripheral neuropathy. Plans are to discharge to SNF for subacute  PT.  Insulin-dependent type 2 diabetes: CBGs currently stable with 15 units of Lantus and SSI-given prior history of recurrent hypoglycemia-would allow some mild permissive hyperglycemia in order to prevent hypoglycemic episodes.  History of peripheral neuropathy: Probably related to long-standing diabetes-resume Cymbalta  History of gait abnormality with mild parkinsonian features: Follows with neurology-Dr. Krista Blue as outpatient. Continue Sinemet.  History of seizures: Continue Lamictal  History of paroxysmal atrial flutter/proximal atrial fibrillation: Rate controlled with amiodarone and metoprolol, not a long-term candidate for anticoagulation (see outpatient cardiology note). Has a history of frequent falls.CHA2DVAS2C score of 4  Hypertension: Controlled, continue with clonidine, metoprolol.  Hypothyroidism: TSH low-Synthroid decreased to 100 g (prior home dose of 137 g). Repeat TSH in next 3-6 months and adjust accordingly.  Chronic kidney disease stage IV: Creatinine close to usual baseline. Monitor electrolytes periodically  COPD: Lungs are clear-this appears stable, continue current bronchodilator regimen  Mild transaminitis: Chronic issue-stable for outpatient follow-up.  History of depression: Resume Abilify over the next few days.  History of right frontal meningioma, status post resection on May 08 2014  Procedures/Studies: None  Discharge Diagnoses:  Principal Problem:   Acute metabolic encephalopathy due to hypoglycemia Active Problems:   Mixed hyperlipidemia   Thyroiditis, autoimmune   Sleep apnea, obstructive   Type 2 diabetes mellitus with polyneuropathy (HCC)   COPD (chronic obstructive pulmonary disease) (HCC)   History of Meningioma    Peripheral neuropathy with Parkisonian features   Essential hypertension   Atrial flutter (HCC)   Seizure disorder (HCC)   Generalized weakness   Transaminitis   CKD (chronic kidney disease) stage 4, GFR  15-29 ml/min (HCC)   Urinary retention   Hypoglycemia   Discharge Instructions:  Activity:  As tolerated with Full fall precautions use walker/cane & assistance as needed  Discharge Instructions    Diet -  low sodium heart healthy    Complete by:  As directed    Diet Carb Modified    Complete by:  As directed    Increase activity slowly    Complete by:  As directed      Allergies as of 05/26/2016      Reactions   Latex Rash   Exenatide Rash   Metformin Rash   RENAL INSUFFICIENCY-takes low dose   Penicillins Rash   Sulfa Antibiotics Other (See Comments)   Unknown allergic reaction      Medication List    STOP taking these medications   ciprofloxacin 500 MG tablet Commonly known as:  CIPRO   HUMULIN R 500 UNIT/ML injection Generic drug:  insulin regular human CONCENTRATED   liraglutide 18 MG/3ML Sopn     TAKE these medications   acetaminophen 325 MG tablet Commonly known as:  TYLENOL Take 650 mg by mouth every 6 (six) hours as needed for mild pain.   albuterol (2.5 MG/3ML) 0.083% nebulizer solution Commonly known as:  PROVENTIL Take 3 mLs (2.5 mg total) by nebulization every 2 (two) hours as needed for shortness of breath.   amiodarone 200 MG tablet Commonly known as:  PACERONE TAKE 1 TABLET (200 MG TOTAL) BY MOUTH DAILY. What changed:  how much to take  how to take this  when to take this  additional instructions   antiseptic oral rinse Liqd 15 mLs by Mouth Rinse route at bedtime.   ARIPiprazole 5 MG tablet Commonly known as:  ABILIFY Take 5 mg by mouth every evening.   CALCIUM 600+D 600-800 MG-UNIT Tabs Generic drug:  Calcium Carb-Cholecalciferol Take 1 tablet by mouth daily.   carbidopa-levodopa 25-100 MG tablet Commonly known as:  SINEMET Take 1 tablet by mouth 3 (three) times daily.   cloNIDine 0.3 mg/24hr patch Commonly known as:  CATAPRES - Dosed in mg/24 hr Place 0.3 mg onto the skin every Friday.   DULoxetine 60 MG  capsule Commonly known as:  CYMBALTA Take 60 mg by mouth 2 (two) times daily.   furosemide 40 MG tablet Commonly known as:  LASIX Take 40 mg by mouth daily.   insulin aspart 100 UNIT/ML injection Commonly known as:  novoLOG 0-9 Units, Subcutaneous, 3 times daily with meals CBG < 70: implement hypoglycemia protocol CBG 70 - 120: 0 units CBG 121 - 150: 1 unit CBG 151 - 200: 2 units CBG 201 - 250: 3 units CBG 251 - 300: 5 units CBG 301 - 350: 7 units CBG 351 - 400: 9 units CBG > 400: call MD   insulin glargine 100 UNIT/ML injection Commonly known as:  LANTUS Inject 0.15 mLs (15 Units total) into the skin at bedtime.   LamoTRIgine XR 200 MG Tb24 Take 1 tablet (200 mg total) by mouth at bedtime.   levothyroxine 100 MCG tablet Commonly known as:  SYNTHROID, LEVOTHROID Take 1 tablet (100 mcg total) by mouth daily before breakfast. What changed:  medication strength  how much to take   magnesium hydroxide 400 MG/5ML suspension Commonly known as:  MILK OF MAGNESIA Take 30 mLs by mouth daily as needed for mild constipation.   metoprolol tartrate 25 MG tablet Commonly known as:  LOPRESSOR Take 12.5 mg by mouth 2 (two) times daily.   mometasone-formoterol 100-5 MCG/ACT Aero Commonly known as:  DULERA Inhale 2 puffs into the lungs 2 (two) times daily.   nystatin powder Commonly known as:  MYCOSTATIN/NYSTOP Apply 1 g topically daily as needed (irritation).  pantoprazole 40 MG tablet Commonly known as:  PROTONIX Take 40 mg by mouth 2 (two) times daily.   senna 8.6 MG Tabs tablet Commonly known as:  SENOKOT Take 2 tablets by mouth at bedtime.      Follow-up Information    GRISSO,GREG, MD. Schedule an appointment as soon as possible for a visit in 2 week(s).   Specialty:  Internal Medicine Contact information: [place] [address] [phone]        Marcial Pacas, MD Follow up in 1 month(s).   Specialty:  Neurology Contact information: [place] [address] [phone]          Allergies  Allergen Reactions  . Latex Rash  . Exenatide Rash  . Metformin Rash    RENAL INSUFFICIENCY-takes low dose  . Penicillins Rash  . Sulfa Antibiotics Other (See Comments)    Unknown allergic reaction    Consultations:   None   Other Procedures/Studies: Dg Chest 2 View  Result Date: 05/20/2016 CLINICAL DATA:  Cough.  General lethargy. EXAM: CHEST  2 VIEW COMPARISON:  May 17, 2016 FINDINGS: Scarring in the lateral left lung base. Cardiomegaly. The hila and mediastinum are normal. No pulmonary nodules, masses, or focal infiltrates. IMPRESSION: Scarring in the lateral left lung base. Electronically Signed   By: Dorise Bullion III M.D   On: 05/20/2016 20:20   Ct Head Wo Contrast  Result Date: 05/20/2016 CLINICAL DATA:  Weakness and tremors EXAM: CT HEAD WITHOUT CONTRAST TECHNIQUE: Contiguous axial images were obtained from the base of the skull through the vertex without intravenous contrast. COMPARISON:  08/12/2015 FINDINGS: Brain: No evidence of acute infarction, hemorrhage, hydrocephalus, extra-axial collection or mass lesion/mass effect. Encephalomalacia is noted in the right frontal lobe consistent with the prior resection. Vascular: No hyperdense vessel or unexpected calcification. Skull: Postsurgical changes are noted in the right frontoparietal region stable from the prior exam. Sinuses/Orbits: No acute finding. Other: None. IMPRESSION: Postsurgical changes.  No acute abnormality noted. Electronically Signed   By: Inez Catalina M.D.   On: 05/20/2016 19:44   Mr Brain Wo Contrast  Result Date: 05/23/2016 CLINICAL DATA:  Progressive weakness EXAM: MRI HEAD WITHOUT CONTRAST MRI CERVICAL SPINE WITHOUT CONTRAST TECHNIQUE: Multiplanar, multiecho pulse sequences of the brain and surrounding structures, and cervical spine, to include the craniocervical junction and cervicothoracic junction, were obtained without  intravenous contrast. COMPARISON:  Head CT 05/20/2016 Brain MRI 08/12/2015 FINDINGS: MRI HEAD FINDINGS Brain: No focal diffusion restriction to indicate acute infarct. No intraparenchymal hemorrhage. Unchanged right frontal encephalomalacia. There is multifocal hyperintense T2-weighted signal within the periventricular white matter, most often seen in the setting of chronic microvascular ischemia. No mass lesion or midline shift. No hydrocephalus or extra-axial fluid collection. The midline structures are normal. No age advanced or lobar predominant atrophy. Vascular: Major intracranial arterial and venous sinus flow voids are preserved. Punctate focus of remote microhemorrhage within the right pons. There is hemosiderin deposition along the right frontal convexity. Skull and upper cervical spine: The visualized skull base, calvarium, upper cervical spine and extracranial soft tissues are normal. Sinuses/Orbits: No fluid levels or advanced mucosal thickening. No mastoid effusion. Normal orbits. MRI CERVICAL SPINE FINDINGS Despite efforts by the technologist and patient, motion artifact is present on today's examination and could not be eliminated. This reduces the sensitivity and specificity of the study. Alignment: Normal Vertebrae: There is anterior cervical fusion at C5-C6 in normal alignment with solid osseous fusion. Cord: Normal signal and caliber  Posterior Fossa, vertebral arteries, paraspinal tissues: Visualized posterior fossa is normal. Vertebral artery flow voids are preserved. Normal visualized paraspinal soft tissues. Disc levels: C1-C2: Normal. C2-C3: Normal disc space and facets. No spinal canal or neuroforaminal stenosis. C3-C4: Mild disc bulge without spinal canal or neural foraminal stenosis. C4-C5: Mild spinal canal stenosis secondary to small disc bulge and mild ligamentum flavum buckling. C5-C6: Postfusion changes with patency of the thecal sac. Susceptibility artifact obscures the right neural  foramen. The left neural foramen is widely patent. C6-C7: Normal disc space and facets. No spinal canal or neuroforaminal stenosis. C7-T1: Normal disc space and facets. No spinal canal or neuroforaminal stenosis. IMPRESSION: 1. No acute intracranial abnormality. Unchanged right frontal encephalomalacia at the site of prior meningioma resection. 2. Chronic microvascular ischemia. 3. Motion degraded examination of the cervical spine. Within that limitation, mild spinal canal stenosis at C4-C5. No other spinal canal stenosis or nerve root impingement. Electronically Signed   By: Ulyses Jarred M.D.   On: 05/23/2016 22:47   Mr Cervical Spine Wo Contrast  Result Date: 05/23/2016 CLINICAL DATA:  Progressive weakness EXAM: MRI HEAD WITHOUT CONTRAST MRI CERVICAL SPINE WITHOUT CONTRAST TECHNIQUE: Multiplanar, multiecho pulse sequences of the brain and surrounding structures, and cervical spine, to include the craniocervical junction and cervicothoracic junction, were obtained without intravenous contrast. COMPARISON:  Head CT 05/20/2016 Brain MRI 08/12/2015 FINDINGS: MRI HEAD FINDINGS Brain: No focal diffusion restriction to indicate acute infarct. No intraparenchymal hemorrhage. Unchanged right frontal encephalomalacia. There is multifocal hyperintense T2-weighted signal within the periventricular white matter, most often seen in the setting of chronic microvascular ischemia. No mass lesion or midline shift. No hydrocephalus or extra-axial fluid collection. The midline structures are normal. No age advanced or lobar predominant atrophy. Vascular: Major intracranial arterial and venous sinus flow voids are preserved. Punctate focus of remote microhemorrhage within the right pons. There is hemosiderin deposition along the right frontal convexity. Skull and upper cervical spine: The visualized skull base, calvarium, upper cervical spine and extracranial soft tissues are normal. Sinuses/Orbits: No fluid levels or advanced  mucosal thickening. No mastoid effusion. Normal orbits. MRI CERVICAL SPINE FINDINGS Despite efforts by the technologist and patient, motion artifact is present on today's examination and could not be eliminated. This reduces the sensitivity and specificity of the study. Alignment: Normal Vertebrae: There is anterior cervical fusion at C5-C6 in normal alignment with solid osseous fusion. Cord: Normal signal and caliber Posterior Fossa, vertebral arteries, paraspinal tissues: Visualized posterior fossa is normal. Vertebral artery flow voids are preserved. Normal visualized paraspinal soft tissues. Disc levels: C1-C2: Normal. C2-C3: Normal disc space and facets. No spinal canal or neuroforaminal stenosis. C3-C4: Mild disc bulge without spinal canal or neural foraminal stenosis. C4-C5: Mild spinal canal stenosis secondary to small disc bulge and mild ligamentum flavum buckling. C5-C6: Postfusion changes with patency of the thecal sac. Susceptibility artifact obscures the right neural foramen. The left neural foramen is widely patent. C6-C7: Normal disc space and facets. No spinal canal or neuroforaminal stenosis. C7-T1: Normal disc space and facets. No spinal canal or neuroforaminal stenosis. IMPRESSION: 1. No acute intracranial abnormality. Unchanged right frontal encephalomalacia at the site of prior meningioma resection. 2. Chronic microvascular ischemia. 3. Motion degraded examination of the cervical spine. Within that limitation, mild spinal canal stenosis at C4-C5. No other spinal canal stenosis or nerve root impingement. Electronically Signed   By: Ulyses Jarred M.D.   On: 05/23/2016 22:47   US Renal  Result Date: 05/22/2016 CLINICAL DATA:  Chronic kidney  disease, stage IV. Elevated BUN and creatinine. History of diabetes, hypertension, obesity, and breast cancer. EXAM: RENAL / URINARY TRACT ULTRASOUND COMPLETE COMPARISON:  Ultrasound of the abdomen on 05/14/2013 FINDINGS: Right Kidney: Length: 11.4 cm.  Minimal renal parenchymal thinning. No focal mass or hydronephrosis. Left Kidney: Length: 11.7 cm. Minimal renal parenchymal thinning. No focal mass or hydronephrosis. Bladder: Appears normal for degree of bladder distention. IMPRESSION: 1. Mild parenchymal thinning of both kidneys. 2. No hydronephrosis or focal mass. Electronically Signed   By: Nolon Nations M.D.   On: 05/22/2016 18:24     TODAY-DAY OF DISCHARGE:  Subjective:   Marylin Lathon today has no headache,no chest abdominal pain,no new weakness tingling or numbness, feels much better wants to go home today.  Objective:   Blood pressure (!) 182/61, pulse 60, temperature 98.2 F (36.8 C), temperature source Oral, resp. rate 19, height 5' 5"  (1.651 m), weight 106.3 kg (234 lb 5.6 oz), SpO2 94 %.  Intake/Output Summary (Last 24 hours) at 05/26/16 0942 Last data filed at 05/26/16 0200  Gross per 24 hour  Intake              480 ml  Output             1050 ml  Net             -570 ml   Filed Weights   05/24/16 2113 05/25/16 0558 05/26/16 0002  Weight: 105.5 kg (232 lb 9.4 oz) 104.9 kg (231 lb 4.2 oz) 106.3 kg (234 lb 5.6 oz)    Exam: Awake Alert, Oriented *3, No new F.N deficits, Normal affect [place].AT,PERRAL Supple Neck,No JVD, No cervical lymphadenopathy appriciated.  Symmetrical Chest wall movement, Good air movement bilaterally, CTAB RRR,No Gallops,Rubs or new Murmurs, No Parasternal Heave +ve B.Sounds, Abd Soft, Non tender, No organomegaly appriciated, No rebound -guarding or rigidity. No Cyanosis, Clubbing or edema, No new Rash or bruise   PERTINENT RADIOLOGIC STUDIES: Dg Chest 2 View  Result Date: 05/20/2016 CLINICAL DATA:  Cough.  General lethargy. EXAM: CHEST  2 VIEW COMPARISON:  May 17, 2016 FINDINGS: Scarring in the lateral left lung base. Cardiomegaly. The hila and mediastinum are normal. No pulmonary nodules, masses, or focal infiltrates. IMPRESSION: Scarring in the lateral left lung base. Electronically  Signed   By: Dorise Bullion III M.D   On: 05/20/2016 20:20   Ct Head Wo Contrast  Result Date: 05/20/2016 CLINICAL DATA:  Weakness and tremors EXAM: CT HEAD WITHOUT CONTRAST TECHNIQUE: Contiguous axial images were obtained from the base of the skull through the vertex without intravenous contrast. COMPARISON:  08/12/2015 FINDINGS: Brain: No evidence of acute infarction, hemorrhage, hydrocephalus, extra-axial collection or mass lesion/mass effect. Encephalomalacia is noted in the right frontal lobe consistent with the prior resection. Vascular: No hyperdense vessel or unexpected calcification. Skull: Postsurgical changes are noted in the right frontoparietal region stable from the prior exam. Sinuses/Orbits: No acute finding. Other: None. IMPRESSION: Postsurgical changes.  No acute abnormality noted. Electronically Signed   By: Inez Catalina M.D.   On: 05/20/2016 19:44   Mr Brain Wo Contrast  Result Date: 05/23/2016 CLINICAL DATA:  Progressive weakness EXAM: MRI HEAD WITHOUT CONTRAST MRI CERVICAL SPINE WITHOUT CONTRAST TECHNIQUE: Multiplanar, multiecho pulse sequences of the brain and surrounding structures, and cervical spine, to include the craniocervical junction and cervicothoracic junction, were obtained without intravenous contrast. COMPARISON:  Head CT 05/20/2016 Brain MRI 08/12/2015 FINDINGS: MRI HEAD FINDINGS Brain: No focal diffusion restriction to indicate acute infarct. No  intraparenchymal hemorrhage. Unchanged right frontal encephalomalacia. There is multifocal hyperintense T2-weighted signal within the periventricular white matter, most often seen in the setting of chronic microvascular ischemia. No mass lesion or midline shift. No hydrocephalus or extra-axial fluid collection. The midline structures are normal. No age advanced or lobar predominant atrophy. Vascular: Major intracranial arterial and venous sinus flow voids are preserved. Punctate focus of remote microhemorrhage within the right  pons. There is hemosiderin deposition along the right frontal convexity. Skull and upper cervical spine: The visualized skull base, calvarium, upper cervical spine and extracranial soft tissues are normal. Sinuses/Orbits: No fluid levels or advanced mucosal thickening. No mastoid effusion. Normal orbits. MRI CERVICAL SPINE FINDINGS Despite efforts by the technologist and patient, motion artifact is present on today's examination and could not be eliminated. This reduces the sensitivity and specificity of the study. Alignment: Normal Vertebrae: There is anterior cervical fusion at C5-C6 in normal alignment with solid osseous fusion. Cord: Normal signal and caliber Posterior Fossa, vertebral arteries, paraspinal tissues: Visualized posterior fossa is normal. Vertebral artery flow voids are preserved. Normal visualized paraspinal soft tissues. Disc levels: C1-C2: Normal. C2-C3: Normal disc space and facets. No spinal canal or neuroforaminal stenosis. C3-C4: Mild disc bulge without spinal canal or neural foraminal stenosis. C4-C5: Mild spinal canal stenosis secondary to small disc bulge and mild ligamentum flavum buckling. C5-C6: Postfusion changes with patency of the thecal sac. Susceptibility artifact obscures the right neural foramen. The left neural foramen is widely patent. C6-C7: Normal disc space and facets. No spinal canal or neuroforaminal stenosis. C7-T1: Normal disc space and facets. No spinal canal or neuroforaminal stenosis. IMPRESSION: 1. No acute intracranial abnormality. Unchanged right frontal encephalomalacia at the site of prior meningioma resection. 2. Chronic microvascular ischemia. 3. Motion degraded examination of the cervical spine. Within that limitation, mild spinal canal stenosis at C4-C5. No other spinal canal stenosis or nerve root impingement. Electronically Signed   By: Ulyses Jarred M.D.   On: 05/23/2016 22:47   Mr Cervical Spine Wo Contrast  Result Date: 05/23/2016 CLINICAL DATA:   Progressive weakness EXAM: MRI HEAD WITHOUT CONTRAST MRI CERVICAL SPINE WITHOUT CONTRAST TECHNIQUE: Multiplanar, multiecho pulse sequences of the brain and surrounding structures, and cervical spine, to include the craniocervical junction and cervicothoracic junction, were obtained without intravenous contrast. COMPARISON:  Head CT 05/20/2016 Brain MRI 08/12/2015 FINDINGS: MRI HEAD FINDINGS Brain: No focal diffusion restriction to indicate acute infarct. No intraparenchymal hemorrhage. Unchanged right frontal encephalomalacia. There is multifocal hyperintense T2-weighted signal within the periventricular white matter, most often seen in the setting of chronic microvascular ischemia. No mass lesion or midline shift. No hydrocephalus or extra-axial fluid collection. The midline structures are normal. No age advanced or lobar predominant atrophy. Vascular: Major intracranial arterial and venous sinus flow voids are preserved. Punctate focus of remote microhemorrhage within the right pons. There is hemosiderin deposition along the right frontal convexity. Skull and upper cervical spine: The visualized skull base, calvarium, upper cervical spine and extracranial soft tissues are normal. Sinuses/Orbits: No fluid levels or advanced mucosal thickening. No mastoid effusion. Normal orbits. MRI CERVICAL SPINE FINDINGS Despite efforts by the technologist and patient, motion artifact is present on today's examination and could not be eliminated. This reduces the sensitivity and specificity of the study. Alignment: Normal Vertebrae: There is anterior cervical fusion at C5-C6 in normal alignment with solid osseous fusion. Cord: Normal signal and caliber Posterior Fossa, vertebral arteries, paraspinal tissues: Visualized posterior fossa is normal. Vertebral artery flow voids are preserved. Normal visualized  paraspinal soft tissues. Disc levels: C1-C2: Normal. C2-C3: Normal disc space and facets. No spinal canal or neuroforaminal  stenosis. C3-C4: Mild disc bulge without spinal canal or neural foraminal stenosis. C4-C5: Mild spinal canal stenosis secondary to small disc bulge and mild ligamentum flavum buckling. C5-C6: Postfusion changes with patency of the thecal sac. Susceptibility artifact obscures the right neural foramen. The left neural foramen is widely patent. C6-C7: Normal disc space and facets. No spinal canal or neuroforaminal stenosis. C7-T1: Normal disc space and facets. No spinal canal or neuroforaminal stenosis. IMPRESSION: 1. No acute intracranial abnormality. Unchanged right frontal encephalomalacia at the site of prior meningioma resection. 2. Chronic microvascular ischemia. 3. Motion degraded examination of the cervical spine. Within that limitation, mild spinal canal stenosis at C4-C5. No other spinal canal stenosis or nerve root impingement. Electronically Signed   By: Ulyses Jarred M.D.   On: 05/23/2016 22:47   US Renal  Result Date: 05/22/2016 CLINICAL DATA:  Chronic kidney disease, stage IV. Elevated BUN and creatinine. History of diabetes, hypertension, obesity, and breast cancer. EXAM: RENAL / URINARY TRACT ULTRASOUND COMPLETE COMPARISON:  Ultrasound of the abdomen on 05/14/2013 FINDINGS: Right Kidney: Length: 11.4 cm. Minimal renal parenchymal thinning. No focal mass or hydronephrosis. Left Kidney: Length: 11.7 cm. Minimal renal parenchymal thinning. No focal mass or hydronephrosis. Bladder: Appears normal for degree of bladder distention. IMPRESSION: 1. Mild parenchymal thinning of both kidneys. 2. No hydronephrosis or focal mass. Electronically Signed   By: Nolon Nations M.D.   On: 05/22/2016 18:24     PERTINENT LAB RESULTS: CBC:  Recent Labs  05/24/16 0201  WBC 6.5  HGB 9.8*  HCT 30.9*  PLT 157   CMET CMP     Component Value Date/Time   NA 136 05/24/2016 0201   K 4.0 05/24/2016 0201   CL 101 05/24/2016 0201   CO2 25 05/24/2016 0201   GLUCOSE 214 (H) 05/24/2016 0201   BUN 28 (H)  05/24/2016 0201   CREATININE 1.69 (H) 05/24/2016 0201   CREATININE 1.41 (H) 12/16/2015 1650   CALCIUM 8.5 (L) 05/24/2016 0201   PROT 5.2 (L) 05/24/2016 0201   ALBUMIN 2.9 (L) 05/24/2016 0201   AST 39 05/24/2016 0201   ALT 17 05/24/2016 0201   ALKPHOS 137 (H) 05/24/2016 0201   BILITOT 0.8 05/24/2016 0201   GFRNONAA 28 (L) 05/24/2016 0201   GFRAA 33 (L) 05/24/2016 0201    GFR Estimated Creatinine Clearance: 34.8 mL/min (by C-G formula based on SCr of 1.69 mg/dL (H)). No results for input(s): LIPASE, AMYLASE in the last 72 hours. No results for input(s): CKTOTAL, CKMB, CKMBINDEX, TROPONINI in the last 72 hours. Invalid input(s): POCBNP No results for input(s): DDIMER in the last 72 hours. No results for input(s): HGBA1C in the last 72 hours. No results for input(s): CHOL, HDL, LDLCALC, TRIG, CHOLHDL, LDLDIRECT in the last 72 hours. No results for input(s): TSH, T4TOTAL, T3FREE, THYROIDAB in the last 72 hours.  Invalid input(s): FREET3 No results for input(s): VITAMINB12, FOLATE, FERRITIN, TIBC, IRON, RETICCTPCT in the last 72 hours. Coags: No results for input(s): INR in the last 72 hours.  Invalid input(s): PT Microbiology: Recent Results (from the past 240 hour(s))  Blood culture (routine x 2)     Status: None (Preliminary result)   Collection Time: 05/22/16  4:15 PM  Result Value Ref Range Status   Specimen Description BLOOD RIGHT ANTECUBITAL  Final   Special Requests BOTTLES DRAWN AEROBIC AND ANAEROBIC 5CC  Final   Culture  NO GROWTH 3 DAYS  Final   Report Status PENDING  Incomplete  MRSA PCR Screening     Status: None   Collection Time: 05/22/16  9:26 PM  Result Value Ref Range Status   MRSA by PCR NEGATIVE NEGATIVE Final    Comment:        The GeneXpert MRSA Assay (FDA approved for NASAL specimens only), is one component of a comprehensive MRSA colonization surveillance program. It is not intended to diagnose MRSA infection nor to guide or monitor treatment  for MRSA infections.   Blood culture (routine x 2)     Status: None (Preliminary result)   Collection Time: 05/22/16  9:29 PM  Result Value Ref Range Status   Specimen Description BLOOD BLOOD RIGHT HAND  Final   Special Requests IN PEDIATRIC BOTTLE  4CC EA  Final   Culture NO GROWTH 3 DAYS  Final   Report Status PENDING  Incomplete    FURTHER DISCHARGE INSTRUCTIONS:  Get Medicines reviewed and adjusted: Please take all your medications with you for your next visit with your Primary MD  Laboratory/radiological data: Please request your Primary MD to go over all hospital tests and procedure/radiological results at the follow up, please ask your Primary MD to get all Hospital records sent to his/her office.  In some cases, they will be blood work, cultures and biopsy results pending at the time of your discharge. Please request that your primary care M.D. goes through all the records of your hospital data and follows up on these results.  Also Note the following: If you experience worsening of your admission symptoms, develop shortness of breath, life threatening emergency, suicidal or homicidal thoughts you must seek medical attention immediately by calling 911 or calling your MD immediately  if symptoms less severe.  You must read complete instructions/literature along with all the possible adverse reactions/side effects for all the Medicines you take and that have been prescribed to you. Take any new Medicines after you have completely understood and accpet all the possible adverse reactions/side effects.   Do not drive when taking Pain medications or sleeping medications (Benzodaizepines)  Do not take more than prescribed Pain, Sleep and Anxiety Medications. It is not advisable to combine anxiety,sleep and pain medications without talking with your primary care practitioner  Special Instructions: If you have smoked or chewed Tobacco  in the last 2 yrs please stop smoking, stop any  regular Alcohol  and or any Recreational drug use.  Wear Seat belts while driving.  Please note: You were cared for by a hospitalist during your hospital stay. Once you are discharged, your primary care physician will handle any further medical issues. Please note that NO REFILLS for any discharge medications will be authorized once you are discharged, as it is imperative that you return to your primary care physician (or establish a relationship with a primary care physician if you do not have one) for your post hospital discharge needs so that they can reassess your need for medications and monitor your lab values.  Total Time spent coordinating discharge including counseling, education and face to face time equals 45 minutes.  SignedOren Binet 05/26/2016 9:42 AM

## 2016-05-26 NOTE — Progress Notes (Signed)
Physical Therapy Treatment Patient Details Name: Morgan [name] MRN: [id number] DOB: [birth year] Today's Date: 05/26/2016    History of Present Illness Pt adm with acute encephalopathy due to hypoglycemia. PMH - craniotomy for meningioma 04/2014, DM, peripheral neuropathy, seizures, gout, VDRF, rt shoulder fx, rt foot fx, autoimmune thyroiditis, breast CA, frequent falls, copd, ckd    PT Comments    Pt requiring min guard for ambulation and second person for chair follow for safety due to patient being unable to ambulate prior to today. Pt planning to d/c today with husband to SNF. Pt requesting to bathe at home prior to going SNF; educated patient and spouse that her safest option is for staffing to provide bath here prior to going to SNF.    Follow Up Recommendations  SNF     Equipment Recommendations  None recommended by PT    Recommendations for Other Services       Precautions / Restrictions Precautions Precautions: Fall Restrictions Weight Bearing Restrictions: No    Mobility  Bed Mobility Overal bed mobility: Needs Assistance Bed Mobility: Supine to Sit     Supine to sit: Mod assist     General bed mobility comments: Mod A for trunk elevation and leg sequencing to get EOB. Requries cuing for hand placement on railing. Pt required min A to to assist with stability at EOB, unable to scoot forward  Transfers Overall transfer level: Needs assistance Equipment used: Rolling walker (2 wheeled) Transfers: Sit to/from [facility] Sit to Stand: Min assist;Mod assist Stand pivot transfers: Min assist       General transfer comment: Min A to boost into standing from normal height x2 and mod A from lower surface.   Ambulation/Gait Ambulation/Gait assistance: Min guard;+2 safety/equipment Ambulation Distance (Feet): 15 Feet Assistive device: Rolling walker (2 wheeled) Gait Pattern/deviations: Step-to pattern;Decreased stance time - right;Decreased  stride length;Trunk flexed     General Gait Details: Cuing for RW guidance to remain on floor at beginning of gait and during turns. Pt unable to progress to step through pattern due to R foot instability. Min guard for RW guidance and safety due to pt ambulating since admission.    Stairs            Wheelchair Mobility    Modified Rankin (Stroke Patients Only)       Balance Overall balance assessment: Needs assistance Sitting-balance support: Bilateral upper extremity supported;Feet supported Sitting balance-Leahy Scale: Poor Sitting balance - Comments: Pt required min A at beginning due to R lateral lean and dropped down to supervision for safety once pt corrected Postural control: Right lateral lean   Standing balance-Leahy Scale: Poor Standing balance comment: Heavy relaince on RW during functional activity                    Cognition Arousal/Alertness: Awake/alert Behavior During Therapy: WFL for tasks assessed/performed Overall Cognitive Status: Within Functional Limits for tasks assessed                      Exercises      General Comments        Pertinent Vitals/Pain Pain Assessment: No/denies pain    Home Living                      Prior Function            PT Goals (current goals can now be found in the care plan section) Acute Rehab  PT Goals Patient Stated Goal: to go to SNF today PT Goal Formulation: With patient/family Potential to Achieve Goals: Good Progress towards PT goals: Progressing toward goals    Frequency    Min 3X/week      PT Plan Current plan remains appropriate    Co-evaluation             End of Session Equipment Utilized During Treatment: Gait belt Activity Tolerance: Patient limited by fatigue Patient left: with call bell/phone within reach;with family/visitor present;in chair     Time: 4136-4383 PT Time Calculation (min) (ACUTE ONLY): 18 min  Charges:  $Gait Training: 8-22  mins                    G Codes:      [facility] 06/06/16, 12:24 PM [place], [place] Pager [phone]

## 2016-05-27 LAB — CULTURE, BLOOD (ROUTINE X 2)
CULTURE: NO GROWTH
Culture: NO GROWTH

## 2016-06-08 NOTE — Progress Notes (Signed)
HPI: FU atrial flutter. Previously cared for by Dr. Doylene Canard. Admitted with atrial flutter in October 2016. Echocardiogram showed normal LV function, grade 1 diastolic dysfunction, moderate mitral regurgitation, moderate left atrial enlargement, mild right atrial enlargement and moderate tricuspid regurgitation. Nuclear study showed ejection fraction 66% and no ischemia or infarction. Patient treated with metoprolol, Cardizem and amiodarone and converted. At previous office visit, apixaban DCed due to previous meningioma resection and frequent falls. I have had discussions with patient and family previously concerning atrial flutter ablation. However she is chronically ill and they preferred conservative measures including continuing amiodarone. Admitted February 2018 with progressive weakness and falls. Fractured ankle. Since last seen, she notes dyspnea both at rest and with exertion. No chest pain, palpitations or syncope.  Current Outpatient Prescriptions  Medication Sig Dispense Refill  . acetaminophen (TYLENOL) 325 MG tablet Take 650 mg by mouth every 6 (six) hours as needed for mild pain.     Marland Kitchen albuterol (PROVENTIL) (2.5 MG/3ML) 0.083% nebulizer solution Take 3 mLs (2.5 mg total) by nebulization every 2 (two) hours as needed for shortness of breath. 75 mL 12  . amiodarone (PACERONE) 200 MG tablet TAKE 1 TABLET (200 MG TOTAL) BY MOUTH DAILY. 90 tablet 3  . antiseptic oral rinse (BIOTENE) LIQD 15 mLs by Mouth Rinse route at bedtime.     . ARIPiprazole (ABILIFY) 5 MG tablet Take 5 mg by mouth every evening.    . Calcium Carb-Cholecalciferol (CALCIUM 600+D) 600-800 MG-UNIT TABS Take 1 tablet by mouth daily.    . carbidopa-levodopa (SINEMET) 25-100 MG tablet Take 1 tablet by mouth 3 (three) times daily. 270 tablet 3  . cloNIDine (CATAPRES - DOSED IN MG/24 HR) 0.3 mg/24hr patch Place 0.3 mg onto the skin every Friday.     . colchicine 0.6 MG tablet Take 0.6 mg by mouth daily.    . diclofenac  sodium (VOLTAREN) 1 % GEL Apply 1 application topically daily as needed.    . DULoxetine (CYMBALTA) 60 MG capsule Take 60 mg by mouth 2 (two) times daily.     . furosemide (LASIX) 40 MG tablet Take 40 mg by mouth daily.    . insulin aspart (NOVOLOG) 100 UNIT/ML injection 0-9 Units, Subcutaneous, 3 times daily with meals CBG < 70: implement hypoglycemia protocol CBG 70 - 120: 0 units CBG 121 - 150: 1 unit CBG 151 - 200: 2 units CBG 201 - 250: 3 units CBG 251 - 300: 5 units CBG 301 - 350: 7 units CBG 351 - 400: 9 units CBG > 400: call MD 10 mL 11  . insulin glargine (LANTUS) 100 UNIT/ML injection Inject 0.15 mLs (15 Units total) into the skin at bedtime. 10 mL 11  . LamoTRIgine XR 200 MG TB24 Take 1 tablet (200 mg total) by mouth at bedtime. 90 tablet 3  . levothyroxine (SYNTHROID, LEVOTHROID) 100 MCG tablet Take 1 tablet (100 mcg total) by mouth daily before breakfast.    . lubiprostone (AMITIZA) 8 MCG capsule Take 8 mcg by mouth 2 (two) times daily with a meal.    . magnesium hydroxide (MILK OF MAGNESIA) 400 MG/5ML suspension Take 30 mLs by mouth daily as needed for mild constipation.    . metoprolol tartrate (LOPRESSOR) 25 MG tablet Take 12.5 mg by mouth 2 (two) times daily.    . mometasone-formoterol (DULERA) 100-5 MCG/ACT AERO Inhale 2 puffs into the lungs 2 (two) times daily.    Marland Kitchen nystatin (MYCOSTATIN/NYSTOP) powder Apply 1 g topically  daily as needed (irritation).    . pantoprazole (PROTONIX) 40 MG tablet Take 40 mg by mouth 2 (two) times daily.     Marland Kitchen senna (SENOKOT) 8.6 MG TABS tablet Take 2 tablets by mouth at bedtime.     . traMADol (ULTRAM) 50 MG tablet Take by mouth every 12 (twelve) hours as needed.    Marland Kitchen aspirin EC 81 MG tablet Take 1 tablet (81 mg total) by mouth daily. 90 tablet 3   No current facility-administered medications for this visit.      Past Medical History:  Diagnosis Date  . Abnormal liver function tests   . Acquired autoimmune hypothyroidism   . Basal cell  carcinoma    Chest  . Cancer (HCC)    Breast  . Combined hyperlipidemia   . COPD with acute exacerbation ([place])   . Depression   . Diabetes mellitus   . Diabetes type 2, uncontrolled ([place])   . DM neuropathy with neurologic complication ([place])   . Fracture    Left wrist  . Goiter   . Gout   . History of gastroesophageal reflux (GERD)   . Hypertension   . Hypoglycemia associated with diabetes ([place])   . Meningioma ([place])   . Obesity   . Renal insufficiency   . Seizure ([place])   . Shoulder fracture, right 07/2015  . Sleep apnea, obstructive   . Thyroiditis, autoimmune   . Type II diabetes mellitus with peripheral angiopathy ([place])   . Vertigo     Past Surgical History:  Procedure Laterality Date  . APPENDECTOMY    . BACK SURGERY    . BASAL CELL CARCINOMA EXCISION     Chest  . CHOLECYSTECTOMY    . CRANIOTOMY Right 05/08/2014   Procedure: CRANIOTOMY FOR MENINGIOMA;  Surgeon: Ashok Pall, MD;  Location: [facility];  Service: Neurosurgery;  Laterality: Right;  Right Craniotomy for meningioma  . ESOPHAGOGASTRODUODENOSCOPY (EGD) WITH PROPOFOL N/A 05/22/2014   Procedure: ESOPHAGOGASTRODUODENOSCOPY (EGD) WITH PROPOFOL;  Surgeon: Wonda Horner, MD;  Location: [facility] END[facility]COPY;  Service: Endoscopy;  Laterality: N/A;  . LAPAROSCOPIC GASTRIC BANDING    . MASTECTOMY Left     Social History   Social History  . Marital status: Married    Spouse name: N/A  . Number of children: 2  . Years of education: 12   Occupational History  . Retired    Social History Main Topics  . Smoking status: Never Smoker  . Smokeless tobacco: Never Used  . Alcohol use No  . Drug use: No  . Sexual activity: No   Other Topics Concern  . Not on file   Social History Narrative   Currently in nursing home for rehab.   Right-handed.   No caffeine use.    Family History  Problem Relation Age of Onset  . Melanoma Mother   . Diabetes Father   . CAD Father   . Diabetes Sister   . Diabetes Brother      ROS: no fevers or chills, productive cough, hemoptysis, dysphasia, odynophagia, melena, hematochezia, dysuria, hematuria, rash, seizure activity, orthopnea, PND, pedal edema, claudication. Remaining systems are negative.  Physical Exam: Well-developed well-nourished in no acute distress. Flat affect Skin is warm and dry.  HEENT is normal.  Neck is supple.  Chest is clear to auscultation with normal expansion.  Cardiovascular exam is regular rate and rhythm.  Abdominal exam nontender or distended. No masses palpated. Extremities show no edema. neuro grossly intact  ECG-Sinus rhythm at  a rate of 69. Cannot rule out prior septal infarct and inferior infarct. personally reviewed  A/P  1 atrial flutter-patient remains in sinus rhythm. Continue amiodarone. We have felt the risk of anticoagulation outweighs the benefit given multiple falls. Patient has not wanted to pursue atrial flutter ablation. Note TSH was decreased during recent hospitalization. I will ask her to follow up with primary care physician this issue. Chest x-ray was negative. Liver functions showed mildly elevated alkaline phosphatase likely from recent ankle fracture. Plan follow-up studies in the future. Add aspirin 81 mg daily.   2 hyperlipidemia-management per primary care.  3 hypertension-blood pressure controlled. Continue present medications.  4 mitral regurgitation-we'll repeat echocardiogram.  5 dyspnea-she is not volume overloaded on examination. I will arrange an echocardiogram to assess LV function and mitral regurgitation. Recent BNP 57.  Kirk Ruths, MD

## 2016-06-13 ENCOUNTER — Encounter: Payer: Self-pay | Admitting: Cardiology

## 2016-06-13 ENCOUNTER — Ambulatory Visit (INDEPENDENT_AMBULATORY_CARE_PROVIDER_SITE_OTHER): Payer: Medicare Other | Admitting: Cardiology

## 2016-06-13 VITALS — BP 132/80 | HR 69 | Ht 65.0 in

## 2016-06-13 DIAGNOSIS — I059 Rheumatic mitral valve disease, unspecified: Secondary | ICD-10-CM | POA: Diagnosis not present

## 2016-06-13 DIAGNOSIS — I483 Typical atrial flutter: Secondary | ICD-10-CM

## 2016-06-13 DIAGNOSIS — E7800 Pure hypercholesterolemia, unspecified: Secondary | ICD-10-CM

## 2016-06-13 DIAGNOSIS — I10 Essential (primary) hypertension: Secondary | ICD-10-CM

## 2016-06-13 MED ORDER — ASPIRIN EC 81 MG PO TBEC: 81.0000 mg | DELAYED_RELEASE_TABLET | Freq: Every day | ORAL | 3 refills | Status: DC

## 2016-06-13 NOTE — Patient Instructions (Signed)
Medication Instructions:   START ASPIRIN 81 MG ONCE DAILY  Testing/Procedures:  Your physician has requested that you have an echocardiogram. Echocardiography is a painless test that uses sound waves to create images of your heart. It provides your doctor with information about the size and shape of your heart and how well your heart's chambers and valves are working. This procedure takes approximately one hour. There are no restrictions for this procedure.    Follow-Up:  Your physician wants you to follow-up in: [place] will receive a reminder letter in the mail two months in advance. If you don't receive a letter, please call our office to schedule the follow-up appointment.   If you need a refill on your cardiac medications before your next appointment, please call your pharmacy.

## 2016-06-28 ENCOUNTER — Encounter (INDEPENDENT_AMBULATORY_CARE_PROVIDER_SITE_OTHER): Payer: Self-pay

## 2016-06-28 ENCOUNTER — Ambulatory Visit (HOSPITAL_COMMUNITY): Payer: Medicare Other | Attending: Internal Medicine

## 2016-06-28 ENCOUNTER — Other Ambulatory Visit: Payer: Self-pay

## 2016-06-28 DIAGNOSIS — E669 Obesity, unspecified: Secondary | ICD-10-CM | POA: Diagnosis not present

## 2016-06-28 DIAGNOSIS — I059 Rheumatic mitral valve disease, unspecified: Secondary | ICD-10-CM | POA: Diagnosis not present

## 2016-06-28 DIAGNOSIS — I10 Essential (primary) hypertension: Secondary | ICD-10-CM | POA: Insufficient documentation

## 2016-06-28 DIAGNOSIS — Z6839 Body mass index (BMI) 39.0-39.9, adult: Secondary | ICD-10-CM | POA: Diagnosis not present

## 2016-06-28 DIAGNOSIS — G4733 Obstructive sleep apnea (adult) (pediatric): Secondary | ICD-10-CM | POA: Insufficient documentation

## 2016-06-28 DIAGNOSIS — I071 Rheumatic tricuspid insufficiency: Secondary | ICD-10-CM | POA: Diagnosis not present

## 2016-06-28 DIAGNOSIS — R0600 Dyspnea, unspecified: Secondary | ICD-10-CM | POA: Insufficient documentation

## 2016-06-28 DIAGNOSIS — I509 Heart failure, unspecified: Secondary | ICD-10-CM | POA: Diagnosis not present

## 2016-06-28 DIAGNOSIS — I4892 Unspecified atrial flutter: Secondary | ICD-10-CM | POA: Diagnosis not present

## 2016-07-04 ENCOUNTER — Encounter: Payer: Self-pay | Admitting: Cardiology

## 2016-07-04 NOTE — Telephone Encounter (Signed)
New Message  Pt returning nurses call.  Please f/u

## 2016-07-04 NOTE — Telephone Encounter (Signed)
This encounter was created in error - please disregard.

## 2016-07-18 ENCOUNTER — Other Ambulatory Visit: Payer: Self-pay | Admitting: Neurosurgery

## 2016-07-18 DIAGNOSIS — D329 Benign neoplasm of meninges, unspecified: Secondary | ICD-10-CM

## 2016-08-08 ENCOUNTER — Ambulatory Visit
Admission: RE | Admit: 2016-08-08 | Discharge: 2016-08-08 | Disposition: A | Discharge status: Home / Self Care | Payer: Medicare Other | Source: Ambulatory Visit | Attending: Neurosurgery | Admitting: Neurosurgery

## 2016-08-08 DIAGNOSIS — D329 Benign neoplasm of meninges, unspecified: Secondary | ICD-10-CM

## 2016-08-08 MED ORDER — GADOBENATE DIMEGLUMINE 529 MG/ML IV SOLN
10.0000 mL | Freq: Once | INTRAVENOUS | Status: AC | PRN
  Administered 2016-08-08: 10 mL via INTRAVENOUS

## 2016-08-23 ENCOUNTER — Ambulatory Visit (INDEPENDENT_AMBULATORY_CARE_PROVIDER_SITE_OTHER): Payer: Medicare Other | Admitting: Nurse Practitioner

## 2016-08-23 ENCOUNTER — Ambulatory Visit: Payer: Medicare Other | Admitting: Nurse Practitioner

## 2016-08-23 ENCOUNTER — Encounter: Payer: Self-pay | Admitting: Nurse Practitioner

## 2016-08-23 VITALS — BP 170/75 | HR 64 | Ht 65.0 in | Wt 226.2 lb

## 2016-08-23 DIAGNOSIS — R269 Unspecified abnormalities of gait and mobility: Secondary | ICD-10-CM | POA: Diagnosis not present

## 2016-08-23 DIAGNOSIS — D329 Benign neoplasm of meninges, unspecified: Secondary | ICD-10-CM

## 2016-08-23 DIAGNOSIS — G6289 Other specified polyneuropathies: Secondary | ICD-10-CM

## 2016-08-23 DIAGNOSIS — G4733 Obstructive sleep apnea (adult) (pediatric): Secondary | ICD-10-CM | POA: Diagnosis not present

## 2016-08-23 DIAGNOSIS — G40909 Epilepsy, unspecified, not intractable, without status epilepticus: Secondary | ICD-10-CM | POA: Diagnosis not present

## 2016-08-23 DIAGNOSIS — G20 Parkinson's disease: Secondary | ICD-10-CM

## 2016-08-23 NOTE — Progress Notes (Signed)
I have reviewed and agreed above plan. 
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
## 2016-08-23 NOTE — Patient Instructions (Signed)
Continue lamotrigine at current dose for complex partial seizure disorder Memory score is stable Continue Sinemet 25 100 3 times daily 30 minutes before meals, this is for your gait abnormality as well as mild parkinsonian features Try to walk around the house for exercise Encouraged to use CPAP for obstructive sleep apnea Follow-up in 6 months

## 2016-09-05 ENCOUNTER — Other Ambulatory Visit: Payer: Self-pay | Admitting: Cardiology

## 2016-11-04 ENCOUNTER — Other Ambulatory Visit: Payer: Self-pay | Admitting: Cardiology

## 2016-11-06 NOTE — Telephone Encounter (Signed)
Called patient to verify rx from [facility].  She previously got it from [facility].  She prefers to receive it from [facility] instead of [facility].

## 2017-01-25 NOTE — Progress Notes (Signed)
HPI: FU atrial flutter. Previously cared for by Dr. Doylene Canard. Admitted with atrial flutter in October 2016. Nuclear study showed ejection fraction 66% and no ischemia or infarction. Patient treated with metoprolol, Cardizem and amiodarone and converted. At previous office visit, apixaban DCed due to previous meningioma resection and frequent falls. I have had discussions with patient and family previously concerning atrial flutter ablation. However she is chronically ill and they preferred conservative measures including continuing amiodarone. Admitted February 2018 with progressive weakness and falls. Fractured ankle. Echocardiogram March 2018 showed normal LV function, moderate left ventricular hypertrophy, grade 1 diastolic dysfunction and mild tricuspid regurgitation. Since last seen, she has some dyspnea but denies chest pain, palpitations, syncope. Minimal pedal edema. She fell approximately one month ago.  Current Outpatient Prescriptions  Medication Sig Dispense Refill  . acetaminophen (TYLENOL) 325 MG tablet Take 650 mg by mouth every 6 (six) hours as needed for mild pain.     Marland Kitchen albuterol (PROVENTIL) (2.5 MG/3ML) 0.083% nebulizer solution Take 3 mLs (2.5 mg total) by nebulization every 2 (two) hours as needed for shortness of breath. 75 mL 12  . amiodarone (PACERONE) 200 MG tablet TAKE 1 TABLET (200 MG TOTAL) BY MOUTH DAILY. 90 tablet 3  . antiseptic oral rinse (BIOTENE) LIQD 15 mLs by Mouth Rinse route at bedtime.     . ARIPiprazole (ABILIFY) 5 MG tablet Take 5 mg by mouth every evening.    Marland Kitchen aspirin EC 81 MG tablet Take 1 tablet (81 mg total) by mouth daily. 90 tablet 3  . budesonide-formoterol (SYMBICORT) 80-4.5 MCG/ACT inhaler Inhale 2 puffs into the lungs 2 (two) times daily.    Marland Kitchen buPROPion (WELLBUTRIN XL) 150 MG 24 hr tablet Take 1 tablet by mouth daily.    . Calcium Carb-Cholecalciferol (CALCIUM 600+D) 600-800 MG-UNIT TABS Take 1 tablet by mouth daily.    . carbidopa-levodopa  (SINEMET) 25-100 MG tablet Take 1 tablet by mouth 3 (three) times daily. 270 tablet 3  . cloNIDine (CATAPRES - DOSED IN MG/24 HR) 0.3 mg/24hr patch Place 0.3 mg onto the skin every Friday.     . colchicine 0.6 MG tablet Take 0.6 mg by mouth daily.    . diclofenac sodium (VOLTAREN) 1 % GEL Apply 1 application topically daily as needed.    . DULoxetine (CYMBALTA) 60 MG capsule Take 60 mg by mouth 2 (two) times daily.     . furosemide (LASIX) 40 MG tablet Take 40 mg by mouth daily.    Marland Kitchen HUMULIN R 500 UNIT/ML injection Inject 8 units at 8am and 2pm and 2 units at 10pm.  1  . insulin glargine (LANTUS) 100 UNIT/ML injection Inject 0.15 mLs (15 Units total) into the skin at bedtime. 10 mL 11  . LamoTRIgine XR 200 MG TB24 Take 1 tablet (200 mg total) by mouth at bedtime. 90 tablet 3  . levothyroxine (SYNTHROID, LEVOTHROID) 100 MCG tablet Take 1 tablet (100 mcg total) by mouth daily before breakfast.    . liraglutide (VICTOZA) 18 MG/3ML SOPN Inject 1.8 mg into the skin daily.    Marland Kitchen lubiprostone (AMITIZA) 8 MCG capsule Take 8 mcg by mouth 2 (two) times daily with a meal.    . magnesium hydroxide (MILK OF MAGNESIA) 400 MG/5ML suspension Take 30 mLs by mouth daily as needed for mild constipation.    . metoprolol tartrate (LOPRESSOR) 50 MG tablet Take 50 mg by mouth 2 (two) times daily.  2  . nystatin (MYCOSTATIN/NYSTOP) powder Apply 1 g  topically daily as needed (irritation).    . pantoprazole (PROTONIX) 40 MG tablet Take 40 mg by mouth 2 (two) times daily.     Marland Kitchen senna (SENOKOT) 8.6 MG TABS tablet Take 2 tablets by mouth at bedtime.     . traMADol (ULTRAM) 50 MG tablet Take by mouth every 12 (twelve) hours as needed.     No current facility-administered medications for this visit.      Past Medical History:  Diagnosis Date  . Abnormal liver function tests   . Acquired autoimmune hypothyroidism   . Basal cell carcinoma    Chest  . Cancer (HCC)    Breast  . Combined hyperlipidemia   . COPD with  acute exacerbation ([place])   . Depression   . Diabetes mellitus   . Diabetes type 2, uncontrolled ([place])   . DM neuropathy with neurologic complication ([place])   . Fracture    Left wrist  . Goiter   . Gout   . History of gastroesophageal reflux (GERD)   . Hypertension   . Hypoglycemia associated with diabetes ([place])   . Meningioma ([place])   . Obesity   . Renal insufficiency   . Seizure ([place])   . Shoulder fracture, right 07/2015  . Sleep apnea, obstructive   . Thyroiditis, autoimmune   . Type II diabetes mellitus with peripheral angiopathy ([place])   . Vertigo     Past Surgical History:  Procedure Laterality Date  . APPENDECTOMY    . BACK SURGERY    . BASAL CELL CARCINOMA EXCISION     Chest  . CHOLECYSTECTOMY    . CRANIOTOMY Right 05/08/2014   Procedure: CRANIOTOMY FOR MENINGIOMA;  Surgeon: Ashok Pall, MD;  Location: [facility];  Service: Neurosurgery;  Laterality: Right;  Right Craniotomy for meningioma  . ESOPHAGOGASTRODUODENOSCOPY (EGD) WITH PROPOFOL N/A 05/22/2014   Procedure: ESOPHAGOGASTRODUODENOSCOPY (EGD) WITH PROPOFOL;  Surgeon: Wonda Horner, MD;  Location: [facility] ENDOSCOPY;  Service: Endoscopy;  Laterality: N/A;  . LAPAROSCOPIC GASTRIC BANDING    . MASTECTOMY Left     Social History   Social History  . Marital status: Married    Spouse name: N/A  . Number of children: 2  . Years of education: 12   Occupational History  . Retired    Social History Main Topics  . Smoking status: Never Smoker  . Smokeless tobacco: Never Used  . Alcohol use No  . Drug use: No  . Sexual activity: No   Other Topics Concern  . Not on file   Social History Narrative   Currently in nursing home for rehab.   Right-handed.   No caffeine use.    Family History  Problem Relation Age of Onset  . Melanoma Mother   . Diabetes Father   . CAD Father   . Diabetes Sister   . Diabetes Brother     ROS: no fevers or chills, productive cough, hemoptysis, dysphasia, odynophagia, melena,  hematochezia, dysuria, hematuria, rash, seizure activity, orthopnea, PND, pedal edema, claudication. Remaining systems are negative.  Physical Exam: Well-developed well-nourished in no acute distress.  Skin is warm and dry.  HEENT is normal.  Neck is supple.  Chest is clear to auscultation with normal expansion.  Cardiovascular exam is regular rate and rhythm.  Abdominal exam nontender or distended. No masses palpated. Extremities show trace edema. neuro c/w Parkinson's   A/P  1 atrial flutter-In sinus on exam; plan to continue amiodarone. Check TSH, liver functions and chest x-ray. We have  elected not to anticoagulate as we feel the risk outweighs the benefits given history of multiple falls (again fell one month ago). Given her multiple comorbidities she has not wanted to pursue ablation. Continue aspirin.  2 hypertension-blood pressure is controlled. Continue present medications. Check K and renal function.  3 hyperlipidemia-managed by primary care.   Kirk Ruths, MD

## 2017-01-26 ENCOUNTER — Other Ambulatory Visit: Payer: Self-pay | Admitting: Neurology

## 2017-01-31 ENCOUNTER — Encounter: Payer: Self-pay | Admitting: Cardiology

## 2017-01-31 ENCOUNTER — Ambulatory Visit (INDEPENDENT_AMBULATORY_CARE_PROVIDER_SITE_OTHER): Payer: Medicare Other | Admitting: Cardiology

## 2017-01-31 ENCOUNTER — Ambulatory Visit (HOSPITAL_COMMUNITY)
Admission: RE | Admit: 2017-01-31 | Discharge: 2017-01-31 | Disposition: A | Discharge status: Home / Self Care | Payer: Medicare Other | Source: Ambulatory Visit | Attending: Cardiology | Admitting: Cardiology

## 2017-01-31 VITALS — BP 139/77 | HR 62 | Ht 65.0 in | Wt 226.2 lb

## 2017-01-31 DIAGNOSIS — Z5181 Encounter for therapeutic drug level monitoring: Secondary | ICD-10-CM | POA: Insufficient documentation

## 2017-01-31 DIAGNOSIS — E7800 Pure hypercholesterolemia, unspecified: Secondary | ICD-10-CM | POA: Diagnosis not present

## 2017-01-31 DIAGNOSIS — I4892 Unspecified atrial flutter: Secondary | ICD-10-CM | POA: Diagnosis not present

## 2017-01-31 DIAGNOSIS — I10 Essential (primary) hypertension: Secondary | ICD-10-CM | POA: Diagnosis not present

## 2017-01-31 DIAGNOSIS — J984 Other disorders of lung: Secondary | ICD-10-CM | POA: Insufficient documentation

## 2017-01-31 LAB — HEPATIC FUNCTION PANEL
ALBUMIN: 3.8 g/dL (ref 3.5–4.8)
ALK PHOS: 163 IU/L — AB (ref 39–117)
ALT: 17 IU/L (ref 0–32)
AST: 64 IU/L — AB (ref 0–40)
BILIRUBIN, DIRECT: 0.15 mg/dL (ref 0.00–0.40)
Bilirubin Total: 0.5 mg/dL (ref 0.0–1.2)
TOTAL PROTEIN: 6.3 g/dL (ref 6.0–8.5)

## 2017-01-31 LAB — BASIC METABOLIC PANEL
BUN/Creatinine Ratio: 17 (ref 12–28)
BUN: 23 mg/dL (ref 8–27)
CO2: 25 mmol/L (ref 20–29)
CREATININE: 1.36 mg/dL — AB (ref 0.57–1.00)
Calcium: 9 mg/dL (ref 8.7–10.3)
Chloride: 99 mmol/L (ref 96–106)
GFR calc Af Amer: 44 mL/min/{1.73_m2} — ABNORMAL LOW (ref >59)
GFR, EST NON AFRICAN AMERICAN: 38 mL/min/{1.73_m2} — AB (ref >59)
Glucose: 127 mg/dL — ABNORMAL HIGH (ref 65–99)
Potassium: 4.5 mmol/L (ref 3.5–5.2)
Sodium: 140 mmol/L (ref 134–144)

## 2017-01-31 LAB — TSH: TSH: 10.11 u[IU]/mL — ABNORMAL HIGH (ref 0.450–4.500)

## 2017-01-31 NOTE — Patient Instructions (Signed)
Medication Instructions:   NO CHANGE  Labwork:  Your physician recommends that you HAVE LAB WORK TODAY  Testing/Procedures:  A chest x-ray takes a picture of the organs and structures inside the chest, including the heart, lungs, and blood vessels. This test can show several things, including, whether the heart is enlarges; whether fluid is building up in the lungs; and whether pacemaker / defibrillator leads are still in place. AT [facility]  Follow-Up:  Your physician wants you to follow-up in: 6 MONTHS WITH DR CRENSHAW You will receive a reminder letter in the mail two months in advance. If you don't receive a letter, please call our office to schedule the follow-up appointment.   If you need a refill on your cardiac medications before your next appointment, please call your pharmacy.    

## 2017-02-01 ENCOUNTER — Telehealth: Payer: Self-pay | Admitting: *Deleted

## 2017-02-01 DIAGNOSIS — R7989 Other specified abnormal findings of blood chemistry: Secondary | ICD-10-CM

## 2017-02-01 DIAGNOSIS — E039 Hypothyroidism, unspecified: Secondary | ICD-10-CM

## 2017-02-01 MED ORDER — LEVOTHYROXINE SODIUM 125 MCG PO TABS: 125.0000 ug | ORAL_TABLET | Freq: Every day | ORAL | 3 refills | Status: DC

## 2017-02-01 NOTE — Telephone Encounter (Addendum)
-----   Message from [name], MD sent at 01/31/2017  5:21 PM EDT ----- Increase synthroid from 100 to 125 micrograms daily; check TSH and Free T4 in 12 weeks [facility] with pt, aware of medication change. New script sent to the pharmacy and Lab orders mailed to the pt.
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
## 2017-02-13 ENCOUNTER — Ambulatory Visit (INDEPENDENT_AMBULATORY_CARE_PROVIDER_SITE_OTHER): Payer: Medicare Other | Admitting: Nurse Practitioner

## 2017-02-13 ENCOUNTER — Encounter: Payer: Self-pay | Admitting: Nurse Practitioner

## 2017-02-13 VITALS — BP 152/81 | HR 66 | Wt 222.4 lb

## 2017-02-13 DIAGNOSIS — G40909 Epilepsy, unspecified, not intractable, without status epilepticus: Secondary | ICD-10-CM

## 2017-02-13 DIAGNOSIS — R269 Unspecified abnormalities of gait and mobility: Secondary | ICD-10-CM

## 2017-02-13 DIAGNOSIS — G20 Parkinson's disease: Secondary | ICD-10-CM | POA: Diagnosis not present

## 2017-02-13 DIAGNOSIS — D329 Benign neoplasm of meninges, unspecified: Secondary | ICD-10-CM | POA: Diagnosis not present

## 2017-02-13 DIAGNOSIS — E1142 Type 2 diabetes mellitus with diabetic polyneuropathy: Secondary | ICD-10-CM | POA: Diagnosis not present

## 2017-02-13 MED ORDER — CARBIDOPA-LEVODOPA 25-100 MG PO TABS: 1.0000 | ORAL_TABLET | Freq: Three times a day (TID) | ORAL | 3 refills | Status: DC

## 2017-02-13 MED ORDER — LAMOTRIGINE ER 200 MG PO TB24: 200.0000 mg | ORAL_TABLET | Freq: Every day | ORAL | 3 refills | Status: DC

## 2017-02-13 NOTE — Progress Notes (Signed)
I have reviewed and agreed above plan. 

## 2017-02-13 NOTE — Patient Instructions (Signed)
Continue lamotrigine at current dose for complex partial seizure disorder will refill Memory score is stable at 28/30.  Continue Sinemet 25 100 3 times daily 30 minutes before meals, this is for your gait abnormality as well as mild parkinsonian features will refill Try to walk around the house for exercise at least 30 minutes daily, use rolling walker Follow-up in 6 months, next with Krista Blue

## 2017-02-23 ENCOUNTER — Ambulatory Visit: Payer: Medicare Other | Admitting: Nurse Practitioner

## 2017-03-16 ENCOUNTER — Telehealth: Payer: Self-pay

## 2017-03-16 DIAGNOSIS — R7989 Other specified abnormal findings of blood chemistry: Secondary | ICD-10-CM

## 2017-03-16 DIAGNOSIS — E039 Hypothyroidism, unspecified: Secondary | ICD-10-CM

## 2017-03-16 MED ORDER — AMIODARONE HCL 200 MG PO TABS: ORAL_TABLET | ORAL | 3 refills | Status: DC

## 2017-03-16 MED ORDER — LEVOTHYROXINE SODIUM 125 MCG PO TABS: 125.0000 ug | ORAL_TABLET | Freq: Every day | ORAL | 3 refills | Status: DC

## 2017-03-16 NOTE — Telephone Encounter (Signed)
Refill sent to pharmacy.   

## 2017-03-21 ENCOUNTER — Other Ambulatory Visit: Payer: Self-pay | Admitting: *Deleted

## 2017-03-21 MED ORDER — LAMOTRIGINE ER 200 MG PO TB24: 200.0000 mg | ORAL_TABLET | Freq: Every day | ORAL | 3 refills | Status: DC

## 2017-03-21 MED ORDER — CARBIDOPA-LEVODOPA 25-100 MG PO TABS: 1.0000 | ORAL_TABLET | Freq: Three times a day (TID) | ORAL | 3 refills | Status: DC

## 2017-03-21 NOTE — Telephone Encounter (Signed)
[facility] new pharmacy for pt.  Request for new prescription on sinemet done.

## 2017-03-27 ENCOUNTER — Telehealth: Payer: Self-pay | Admitting: Cardiology

## 2017-03-27 NOTE — Telephone Encounter (Signed)
Pt is taking Amiodarone and Colchicine should patient be taking both of them? If so,what is the reason?Taking both of them causes organ damage and death.

## 2017-03-27 NOTE — Telephone Encounter (Signed)
Would not recommend this combination for patients with impaired renal function.  She should contact her PCP (or whomever is prescribing the colchicine) to see if she can use allopurinol instead.

## 2017-03-27 NOTE — Telephone Encounter (Signed)
Lm2cb 

## 2017-03-28 NOTE — Telephone Encounter (Signed)
Spoke with eveline at [facility], aware of our recommendations.

## 2017-03-29 ENCOUNTER — Telehealth: Payer: Self-pay | Admitting: Cardiology

## 2017-03-29 NOTE — Telephone Encounter (Signed)
°  New Prob   Pt calling to see if she should still be taking Amiodarone. Please call.

## 2017-03-29 NOTE — Telephone Encounter (Signed)
Per prior notes, she should continue amiodarone and seek recommendation for alternative to colchicine d/t interaction.  I attempted to return call, patient's phone rings w/o answer or VM pickup.

## 2017-03-30 ENCOUNTER — Telehealth: Payer: Self-pay | Admitting: Cardiology

## 2017-03-30 NOTE — Telephone Encounter (Signed)
Mrs. Eyman is calling because [facility]( [facility] )  states that [place] office needs to call them and tell that she needs to stay on Amiodrone . Please call [phone].

## 2017-03-30 NOTE — Telephone Encounter (Signed)
Spoke with debi at [facility] she states that colchicine and amiodarone interact, we have already notified pt that Dr Delsa Grana will need to change to another medication.  Pt notified. She states that they d/c colchicine and will call to make sure they are calling [facility] to cancel the colchicine so she can her amiodarone shipped by [facility]

## 2017-03-30 NOTE — Telephone Encounter (Signed)
Patient called with info noted below.

## 2017-04-12 ENCOUNTER — Telehealth: Payer: Self-pay | Admitting: Cardiology

## 2017-04-12 NOTE — Telephone Encounter (Signed)
CANCEL/ERROR

## 2017-04-17 ENCOUNTER — Other Ambulatory Visit: Payer: Self-pay | Admitting: Cardiology

## 2017-04-18 LAB — T4, FREE: Free T4: 1.27 ng/dL (ref 0.82–1.77)

## 2017-04-18 LAB — TSH: TSH: 20.33 u[IU]/mL — ABNORMAL HIGH (ref 0.450–4.500)

## 2017-06-11 ENCOUNTER — Other Ambulatory Visit: Payer: Self-pay | Admitting: Cardiology

## 2017-07-17 NOTE — Progress Notes (Deleted)
HPI: FU atrial flutter. Previously cared for by Dr. Doylene Canard. Admitted with atrial flutter in October 2016. Nuclear study showed ejection fraction 66% and no ischemia or infarction. Patient treated with metoprolol, Cardizem and amiodarone and converted. At previous office visit, apixaban DCed due to previous meningioma resection and frequent falls. I have had discussions with patient and family previously concerning atrial flutter ablation. However she is chronically ill and they preferred conservative measures including continuing amiodarone. Admitted February 2018 with progressive weakness and falls. Fractured ankle. Echocardiogram March 2018 showed normal LV function, moderate left ventricular hypertrophy, grade 1 diastolic dysfunction and mild tricuspid regurgitation. Since last seen,    Current Outpatient Medications  Medication Sig Dispense Refill  . acetaminophen (TYLENOL) 325 MG tablet Take 650 mg by mouth every 6 (six) hours as needed for mild pain.     Marland Kitchen albuterol (PROVENTIL) (2.5 MG/3ML) 0.083% nebulizer solution Take 3 mLs (2.5 mg total) by nebulization every 2 (two) hours as needed for shortness of breath. 75 mL 12  . amiodarone (PACERONE) 200 MG tablet TAKE 1 TABLET (200 MG TOTAL) BY MOUTH DAILY. 90 tablet 3  . antiseptic oral rinse (BIOTENE) LIQD 15 mLs by Mouth Rinse route at bedtime.     . ARIPiprazole (ABILIFY) 5 MG tablet Take 5 mg by mouth every evening.    Marland Kitchen aspirin EC 81 MG tablet Take 1 tablet (81 mg total) by mouth daily. 90 tablet 3  . budesonide-formoterol (SYMBICORT) 80-4.5 MCG/ACT inhaler Inhale 2 puffs into the lungs 2 (two) times daily.    Marland Kitchen buPROPion (WELLBUTRIN XL) 150 MG 24 hr tablet Take 1 tablet by mouth daily.    . Calcium Carb-Cholecalciferol (CALCIUM 600+D) 600-800 MG-UNIT TABS Take 1 tablet by mouth daily.    . carbidopa-levodopa (SINEMET) 25-100 MG tablet Take 1 tablet by mouth 3 (three) times daily. 270 tablet 3  . cloNIDine (CATAPRES - DOSED IN MG/24  HR) 0.3 mg/24hr patch Place 0.3 mg onto the skin every Friday.     . colchicine 0.6 MG tablet Take 0.6 mg by mouth daily.    . diclofenac sodium (VOLTAREN) 1 % GEL Apply 1 application topically daily as needed.    . DULoxetine (CYMBALTA) 60 MG capsule Take 60 mg by mouth 2 (two) times daily.     . furosemide (LASIX) 40 MG tablet Take 40 mg by mouth daily.    Marland Kitchen HUMULIN R 500 UNIT/ML injection Inject 8 units at 8am and 2pm and 2 units at 10pm.  1  . insulin glargine (LANTUS) 100 UNIT/ML injection Inject 0.15 mLs (15 Units total) into the skin at bedtime. 10 mL 11  . LamoTRIgine 200 MG TB24 24 hour tablet Take 1 tablet (200 mg total) by mouth at bedtime. 90 tablet 3  . levothyroxine (SYNTHROID, LEVOTHROID) 125 MCG tablet Take 1 tablet (125 mcg total) by mouth daily before breakfast. 90 tablet 3  . liraglutide (VICTOZA) 18 MG/3ML SOPN Inject 1.8 mg into the skin daily.    Marland Kitchen lubiprostone (AMITIZA) 8 MCG capsule Take 8 mcg by mouth 2 (two) times daily with a meal.    . magnesium hydroxide (MILK OF MAGNESIA) 400 MG/5ML suspension Take 30 mLs by mouth daily as needed for mild constipation.    . metoprolol tartrate (LOPRESSOR) 50 MG tablet Take 50 mg by mouth 2 (two) times daily.  2  . nystatin (MYCOSTATIN/NYSTOP) powder Apply 1 g topically daily as needed (irritation).    . pantoprazole (PROTONIX) 40 MG tablet  Take 40 mg by mouth 2 (two) times daily.     Marland Kitchen senna (SENOKOT) 8.6 MG TABS tablet Take 2 tablets by mouth at bedtime.     . traMADol (ULTRAM) 50 MG tablet Take by mouth every 12 (twelve) hours as needed.     No current facility-administered medications for this visit.      Past Medical History:  Diagnosis Date  . Abnormal liver function tests   . Acquired autoimmune hypothyroidism   . Basal cell carcinoma    Chest  . Cancer (HCC)    Breast  . Combined hyperlipidemia   . COPD with acute exacerbation ([place])   . Depression   . Diabetes mellitus   . Diabetes type 2, uncontrolled ([place])   .  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 Fracture    Left wrist  . Goiter   . Gout   . History of gastroesophageal reflux (GERD)   . Hypertension   . Hypoglycemia associated with diabetes ([place])   . Meningioma ([place])   . Obesity   . Renal insufficiency   . Seizure ([place])   . Shoulder fracture, right 07/2015  . Sleep apnea, obstructive   . Thyroiditis, autoimmune   . Type II diabetes mellitus with peripheral angiopathy ([place])   . Vertigo     Past Surgical History:  Procedure Laterality Date  . APPENDECTOMY    . BACK SURGERY    . BASAL CELL CARCINOMA EXCISION     Chest  . CHOLECYSTECTOMY    . CRANIOTOMY Right 05/08/2014   Procedure: CRANIOTOMY FOR MENINGIOMA;  Surgeon: Ashok Pall, MD;  Location: [place] [facility];  Service: Neurosurgery;  Laterality: Right;  Right Craniotomy for meningioma  . ESOPHAGOGASTRODUODENOSCOPY (EGD) WITH PROPOFOL N/A 05/22/2014   Procedure: ESOPHAGOGASTRODUODENOSCOPY (EGD) WITH PROPOFOL;  Surgeon: Wonda Horner, MD;  Location: [facility] ENDOSCOPY;  Service: Endoscopy;  Laterality: N/A;  . LAPAROSCOPIC GASTRIC BANDING    . MASTECTOMY Left     Social History   Socioeconomic History  . Marital status: Married    Spouse name: Konrad Dolores  . Number of children: 2  . Years of education: 70  . Highest education level: Not on file  Occupational History  . Occupation: Retired  Scientific laboratory technician  . Financial resource strain: Not on file  . Food insecurity:    Worry: Not on file    Inability: Not on file  . Transportation needs:    Medical: Not on file    Non-medical: Not on file  Tobacco Use  . Smoking status: Never Smoker  . Smokeless tobacco: Never Used  Substance and Sexual Activity  . Alcohol use: No  . Drug use: No  . Sexual activity: Never  Lifestyle  . Physical activity:    Days per week: Not on file    Minutes per session: Not on file  . Stress: Not on file  Relationships  . Social connections:    Talks on phone: Not on file    Gets together:  Not on file    Attends religious service: Not on file    Active member of club or organization: Not on file    Attends meetings of clubs or organizations: Not on file    Relationship status: Not on file  . Intimate partner violence:    Fear of current or ex partner: Not on file    Emotionally abused: Not on file    Physically abused: Not on file    Forced sexual activity:  Not on file  Other Topics Concern  . Not on file  Social History Narrative   02/13/17 living at home with spouse   Right-handed.   No caffeine use.    Family History  Problem Relation Age of Onset  . Melanoma Mother   . Diabetes Father   . CAD Father   . Diabetes Sister   . Diabetes Brother     ROS: no fevers or chills, productive cough, hemoptysis, dysphasia, odynophagia, melena, hematochezia, dysuria, hematuria, rash, seizure activity, orthopnea, PND, pedal edema, claudication. Remaining systems are negative.  Physical Exam: Well-developed well-nourished in no acute distress.  Skin is warm and dry.  HEENT is normal.  Neck is supple.  Chest is clear to auscultation with normal expansion.  Cardiovascular exam is regular rate and rhythm.  Abdominal exam nontender or distended. No masses palpated. Extremities show no edema. neuro grossly intact  ECG- personally reviewed  A/P  1 atrial flutter-patient remains in sinus rhythm today.  Continue amiodarone.  Check TSH, liver functions and chest x-ray.  We have felt anticoagulation risk outweighs benefit given history of multiple falls in the past.  She has not wanted to consider ablation given multiple comorbidities.  Continue new aspirin.  2 hypertension-blood pressure is controlled.  Continue present medications.  3 hyperlipidemia-managed by primary care.  Kirk Ruths, MD

## 2017-07-27 ENCOUNTER — Other Ambulatory Visit: Payer: Self-pay | Admitting: Neurosurgery

## 2017-07-27 DIAGNOSIS — D329 Benign neoplasm of meninges, unspecified: Secondary | ICD-10-CM

## 2017-07-30 ENCOUNTER — Ambulatory Visit: Payer: Medicare Other | Admitting: Cardiology

## 2017-07-30 ENCOUNTER — Encounter: Payer: Self-pay | Admitting: Cardiology

## 2017-07-30 ENCOUNTER — Ambulatory Visit (HOSPITAL_COMMUNITY)
Admission: RE | Admit: 2017-07-30 | Discharge: 2017-07-30 | Disposition: A | Discharge status: Home / Self Care | Payer: Medicare Other | Source: Ambulatory Visit | Attending: Cardiology | Admitting: Cardiology

## 2017-07-30 ENCOUNTER — Ambulatory Visit (INDEPENDENT_AMBULATORY_CARE_PROVIDER_SITE_OTHER): Payer: Medicare Other | Admitting: Cardiology

## 2017-07-30 VITALS — BP 152/78 | HR 69 | Ht 65.0 in | Wt 226.0 lb

## 2017-07-30 DIAGNOSIS — I10 Essential (primary) hypertension: Secondary | ICD-10-CM

## 2017-07-30 DIAGNOSIS — E7800 Pure hypercholesterolemia, unspecified: Secondary | ICD-10-CM | POA: Diagnosis not present

## 2017-07-30 DIAGNOSIS — I4892 Unspecified atrial flutter: Secondary | ICD-10-CM | POA: Diagnosis present

## 2017-07-30 DIAGNOSIS — E039 Hypothyroidism, unspecified: Secondary | ICD-10-CM

## 2017-07-30 NOTE — Patient Instructions (Signed)
Medication Instructions:   NO CHANGE  Labwork:  Your physician recommends that you HAVE LAB WORK TODAY  Testing/Procedures:  A chest x-ray takes a picture of the organs and structures inside the chest, including the heart, lungs, and blood vessels. This test can show several things, including, whether the heart is enlarges; whether fluid is building up in the lungs; and whether pacemaker / defibrillator leads are still in place. AT [facility]  Follow-Up:  Your physician wants you to follow-up in: 6 MONTHS WITH DR CRENSHAW You will receive a reminder letter in the mail two months in advance. If you don't receive a letter, please call our office to schedule the follow-up appointment.   If you need a refill on your cardiac medications before your next appointment, please call your pharmacy.    

## 2017-07-30 NOTE — Progress Notes (Signed)
HPI: FU atrial flutter. Previously cared for by Dr. Doylene Canard. Admitted with atrial flutter in October 2016. Nuclear study showed ejection fraction 66% and no ischemia or infarction. Patient treated with metoprolol, Cardizem and amiodarone and converted. At previous office visit, apixaban DCed due to previous meningioma resection and frequent falls. I have had discussions with patient and family previously concerning atrial flutter ablation. However she is chronically ill and they preferred conservative measures including continuing amiodarone. Admitted February 2018 with progressive weakness and falls. Fractured ankle. Echocardiogram March 2018 showed normal LV function, moderate left ventricular hypertrophy, grade 1 diastolic dysfunction and mild tricuspid regurgitation. Since last seen,  she has some fatigue and mild dyspnea on exertion.  No exertional chest pain, palpitations or syncope.   Current Outpatient Medications  Medication Sig Dispense Refill  . acetaminophen (TYLENOL) 325 MG tablet Take 650 mg by mouth every 6 (six) hours as needed for mild pain.     Marland Kitchen albuterol (PROVENTIL) (2.5 MG/3ML) 0.083% nebulizer solution Take 3 mLs (2.5 mg total) by nebulization every 2 (two) hours as needed for shortness of breath. 75 mL 12  . amiodarone (PACERONE) 200 MG tablet TAKE 1 TABLET (200 MG TOTAL) BY MOUTH DAILY. 90 tablet 3  . antiseptic oral rinse (BIOTENE) LIQD 15 mLs by Mouth Rinse route at bedtime.     . ARIPiprazole (ABILIFY) 5 MG tablet Take 5 mg by mouth every evening.    Marland Kitchen aspirin EC 81 MG tablet Take 1 tablet (81 mg total) by mouth daily. 90 tablet 3  . budesonide-formoterol (SYMBICORT) 80-4.5 MCG/ACT inhaler Inhale 2 puffs into the lungs 2 (two) times daily.    . Calcium Carb-Cholecalciferol (CALCIUM 600+D) 600-800 MG-UNIT TABS Take 1 tablet by mouth daily.    . carbidopa-levodopa (SINEMET) 25-100 MG tablet Take 1 tablet by mouth 3 (three) times daily. 270 tablet 3  . cloNIDine  (CATAPRES - DOSED IN MG/24 HR) 0.3 mg/24hr patch Place 0.3 mg onto the skin every Friday.     . colchicine 0.6 MG tablet Take 0.6 mg by mouth daily.    . diclofenac sodium (VOLTAREN) 1 % GEL Apply 1 application topically daily as needed.    . DULoxetine (CYMBALTA) 60 MG capsule Take 60 mg by mouth 2 (two) times daily.     . furosemide (LASIX) 40 MG tablet Take 20 mg by mouth daily. Pt takes 20 mg    . HUMULIN R 500 UNIT/ML injection Inject 8 units at 8am and 2pm and 2 units at 10pm.  1  . insulin glargine (LANTUS) 100 UNIT/ML injection Inject 0.15 mLs (15 Units total) into the skin at bedtime. 10 mL 11  . LamoTRIgine 200 MG TB24 24 hour tablet Take 1 tablet (200 mg total) by mouth at bedtime. 90 tablet 3  . levothyroxine (SYNTHROID, LEVOTHROID) 125 MCG tablet Take 1 tablet (125 mcg total) by mouth daily before breakfast. 90 tablet 3  . liraglutide (VICTOZA) 18 MG/3ML SOPN Inject 1.8 mg into the skin daily.    Marland Kitchen lubiprostone (AMITIZA) 8 MCG capsule Take 8 mcg by mouth 2 (two) times daily with a meal.    . magnesium hydroxide (MILK OF MAGNESIA) 400 MG/5ML suspension Take 30 mLs by mouth daily as needed for mild constipation.    . metoprolol tartrate (LOPRESSOR) 50 MG tablet Take 50 mg by mouth 2 (two) times daily.  2  . nystatin (MYCOSTATIN/NYSTOP) powder Apply 1 g topically daily as needed (irritation).    . pantoprazole (PROTONIX)  40 MG tablet Take 40 mg by mouth 2 (two) times daily.     Marland Kitchen senna (SENOKOT) 8.6 MG TABS tablet Take 2 tablets by mouth at bedtime.     . traMADol (ULTRAM) 50 MG tablet Take by mouth every 12 (twelve) hours as needed.     No current facility-administered medications for this visit.      Past Medical History:  Diagnosis Date  . Abnormal liver function tests   . Acquired autoimmune hypothyroidism   . Basal cell carcinoma    Chest  . Cancer (HCC)    Breast  . Combined hyperlipidemia   . COPD with acute exacerbation ([place])   . Depression   . Diabetes mellitus     . Diabetes type 2, uncontrolled ([place])   . 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 Fracture    Left wrist  . Goiter   . Gout   . History of gastroesophageal reflux (GERD)   . Hypertension   . Hypoglycemia associated with diabetes ([place])   . Meningioma ([place])   . Obesity   . Renal insufficiency   . Seizure ([place])   . Shoulder fracture, right 07/2015  . Sleep apnea, obstructive   . Thyroiditis, autoimmune   . Type II diabetes mellitus with peripheral angiopathy ([place])   . Vertigo     Past Surgical History:  Procedure Laterality Date  . APPENDECTOMY    . BACK SURGERY    . BASAL CELL CARCINOMA EXCISION     Chest  . CHOLECYSTECTOMY    . CRANIOTOMY Right 05/08/2014   Procedure: CRANIOTOMY FOR MENINGIOMA;  Surgeon: Ashok Pall, MD;  Location: [place] [facility];  Service: Neurosurgery;  Laterality: Right;  Right Craniotomy for meningioma  . ESOPHAGOGASTRODUODENOSCOPY (EGD) WITH PROPOFOL N/A 05/22/2014   Procedure: ESOPHAGOGASTRODUODENOSCOPY (EGD) WITH PROPOFOL;  Surgeon: Wonda Horner, MD;  Location: [facility] ENDOSCOPY;  Service: Endoscopy;  Laterality: N/A;  . LAPAROSCOPIC GASTRIC BANDING    . MASTECTOMY Left     Social History   Socioeconomic History  . Marital status: Married    Spouse name: Konrad Dolores  . Number of children: 2  . Years of education: 27  . Highest education level: Not on file  Occupational History  . Occupation: Retired  Scientific laboratory technician  . Financial resource strain: Not on file  . Food insecurity:    Worry: Not on file    Inability: Not on file  . Transportation needs:    Medical: Not on file    Non-medical: Not on file  Tobacco Use  . Smoking status: Never Smoker  . Smokeless tobacco: Never Used  Substance and Sexual Activity  . Alcohol use: No  . Drug use: No  . Sexual activity: Never  Lifestyle  . Physical activity:    Days per week: Not on file    Minutes per session: Not on file  . Stress: Not on file  Relationships  . Social connections:     Talks on phone: Not on file    Gets together: Not on file    Attends religious service: Not on file    Active member of club or organization: Not on file    Attends meetings of clubs or organizations: Not on file    Relationship status: Not on file  . Intimate partner violence:    Fear of current or ex partner: Not on file    Emotionally abused: Not on file    Physically abused: Not on file  Forced sexual activity: Not on file  Other Topics Concern  . Not on file  Social History Narrative   02/13/17 living at home with spouse   Right-handed.   No caffeine use.    Family History  Problem Relation Age of Onset  . Melanoma Mother   . Diabetes Father   . CAD Father   . Diabetes Sister   . Diabetes Brother     ROS: fatigue but no fevers or chills, productive cough, hemoptysis, dysphasia, odynophagia, melena, hematochezia, dysuria, hematuria, rash, seizure activity, orthopnea, PND, pedal edema, claudication. Remaining systems are negative.  Physical Exam: Well-developed obese in no acute distress.  Skin is warm and dry.  HEENT is normal.  Neck is supple.  Chest is clear to auscultation with normal expansion.  Cardiovascular exam is regular rate and rhythm.  Abdominal exam nontender or distended. No masses palpated. Extremities show no edema. neuro grossly intact  ECG-sinus rhythm at a rate of 69.  Nonspecific ST changes.  Cannot rule out prior septal infarct.  Personally reviewed  A/P  1 atrial flutter-patient remains in sinus rhythm today.  Continue amiodarone.  Check TSH, liver functions and chest x-ray.  We have felt anticoagulation risk outweighs benefit given history of multiple falls in the past.  She has not wanted to consider ablation given multiple comorbidities.  Continue aspirin.  2 hypertension-blood pressure is elevated.  However typically controlled at home. Continue present medications and follow.  3 hyperlipidemia-managed by primary care.  Kirk Ruths,  MD

## 2017-07-31 ENCOUNTER — Telehealth: Payer: Self-pay | Admitting: *Deleted

## 2017-07-31 DIAGNOSIS — E039 Hypothyroidism, unspecified: Secondary | ICD-10-CM

## 2017-07-31 DIAGNOSIS — R7989 Other specified abnormal findings of blood chemistry: Secondary | ICD-10-CM

## 2017-07-31 LAB — HEPATIC FUNCTION PANEL
ALBUMIN: 4.1 g/dL (ref 3.5–4.8)
ALK PHOS: 136 IU/L — AB (ref 39–117)
ALT: 18 IU/L (ref 0–32)
AST: 33 IU/L (ref 0–40)
Bilirubin Total: 0.5 mg/dL (ref 0.0–1.2)
Bilirubin, Direct: 0.12 mg/dL (ref 0.00–0.40)
TOTAL PROTEIN: 6.8 g/dL (ref 6.0–8.5)

## 2017-07-31 LAB — BASIC METABOLIC PANEL
BUN/Creatinine Ratio: 18 (ref 12–28)
BUN: 28 mg/dL — ABNORMAL HIGH (ref 8–27)
CO2: 23 mmol/L (ref 20–29)
CREATININE: 1.52 mg/dL — AB (ref 0.57–1.00)
Calcium: 9.6 mg/dL (ref 8.7–10.3)
Chloride: 96 mmol/L (ref 96–106)
GFR calc Af Amer: 38 mL/min/{1.73_m2} — ABNORMAL LOW (ref >59)
GFR, EST NON AFRICAN AMERICAN: 33 mL/min/{1.73_m2} — AB (ref >59)
Glucose: 261 mg/dL — ABNORMAL HIGH (ref 65–99)
Potassium: 4.3 mmol/L (ref 3.5–5.2)
SODIUM: 138 mmol/L (ref 134–144)

## 2017-07-31 LAB — TSH: TSH: 132.5 u[IU]/mL — ABNORMAL HIGH (ref 0.450–4.500)

## 2017-07-31 MED ORDER — LEVOTHYROXINE SODIUM 200 MCG PO TABS: 200.0000 ug | ORAL_TABLET | Freq: Every day | ORAL | 3 refills | Status: DC

## 2017-07-31 NOTE — Addendum Note (Signed)
Addended by: Zebedee Iba on: 07/31/2017 02:41 PM   Modules accepted: Orders

## 2017-07-31 NOTE — Telephone Encounter (Addendum)
-----   Message from [name], MD sent at 07/31/2017  7:44 AM EDT ----- Change synthroid to 200 micrograms daily; please ask pt to fu with primary care concerning hypothyroid; repeat TSH and free T4 12 weeks Kirk Ruths  patient aware of results of labs and cxr. Patient voiced understanding of medication change and follow up labs. New script sent to the pharmacy and Lab orders mailed to the pt

## 2017-08-07 ENCOUNTER — Ambulatory Visit
Admission: RE | Admit: 2017-08-07 | Discharge: 2017-08-07 | Disposition: A | Discharge status: Home / Self Care | Payer: Medicare Other | Source: Ambulatory Visit | Attending: Neurosurgery | Admitting: Neurosurgery

## 2017-08-07 DIAGNOSIS — D329 Benign neoplasm of meninges, unspecified: Secondary | ICD-10-CM

## 2017-08-07 MED ORDER — GADOBENATE DIMEGLUMINE 529 MG/ML IV SOLN
10.0000 mL | Freq: Once | INTRAVENOUS | Status: AC | PRN
  Administered 2017-08-07: 10 mL via INTRAVENOUS

## 2017-08-13 ENCOUNTER — Ambulatory Visit (INDEPENDENT_AMBULATORY_CARE_PROVIDER_SITE_OTHER): Payer: Medicare Other | Admitting: Neurology

## 2017-08-13 ENCOUNTER — Encounter: Payer: Self-pay | Admitting: Neurology

## 2017-08-13 VITALS — BP 172/72 | HR 59 | Ht 65.0 in | Wt 225.5 lb

## 2017-08-13 DIAGNOSIS — G8194 Hemiplegia, unspecified affecting left nondominant side: Secondary | ICD-10-CM | POA: Diagnosis not present

## 2017-08-13 DIAGNOSIS — Z9889 Other specified postprocedural states: Secondary | ICD-10-CM | POA: Diagnosis not present

## 2017-08-13 DIAGNOSIS — G40909 Epilepsy, unspecified, not intractable, without status epilepticus: Secondary | ICD-10-CM

## 2017-08-13 HISTORY — DX: Other specified postprocedural states: Z98.890

## 2017-08-13 MED ORDER — LAMOTRIGINE ER 200 MG PO TB24: 200.0000 mg | ORAL_TABLET | Freq: Every day | ORAL | 4 refills | Status: DC
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
## 2017-08-14 ENCOUNTER — Telehealth: Payer: Self-pay | Admitting: Neurology

## 2017-08-14 ENCOUNTER — Encounter: Payer: Self-pay | Admitting: *Deleted

## 2017-08-14 NOTE — Telephone Encounter (Signed)
Pts daughter requesting a call back to go over the office notes from yesterday, to also discuss the changes in the pts medication. [name] would also like a call back to discuss the PT order but aware Morgan [name] will be out of the office until tomorrow. Please call to advise

## 2017-08-14 NOTE — Telephone Encounter (Signed)
Dr. Krista Blue has reviewed her chart. She referred her to PT and has stopped Sinemet for now.  Reviewed with patient's daughter, Lynelle Smoke, who verbalized understanding.

## 2017-11-16 ENCOUNTER — Encounter: Payer: Self-pay | Admitting: *Deleted

## 2017-11-28 ENCOUNTER — Telehealth: Payer: Self-pay | Admitting: Cardiology

## 2017-11-28 NOTE — Telephone Encounter (Signed)
New Message:     Please call, concerning her Thyroid blood work

## 2017-11-29 NOTE — Telephone Encounter (Signed)
Spoke with pt, she wanted [place] to know she had her thyroid lab work done at dr autumn jones office. Called and requested recent lab work be faxed to [place].

## 2018-01-14 NOTE — Progress Notes (Signed)
HPI: FU atrial flutter. Previously cared for by Dr. Doylene Canard. Admitted with atrial flutter in October 2016. Nuclear study showed ejection fraction 66% and no ischemia or infarction. Patient treated with metoprolol, Cardizem and amiodarone and converted. At previous office visit, apixaban DCed due to previous meningioma resection and frequent falls. I have had discussions with patient and family previously concerning atrial flutter ablation. However she is chronically ill and they preferred conservative measures including continuing amiodarone. Admitted February 2018 with progressive weakness and falls. Fractured ankle.Echocardiogram March 2018 showed normal LV function, moderate left ventricular hypertrophy, grade 1 diastolic dysfunction and mild tricuspid regurgitation.Since last seen,she does have some dyspnea on exertion.  No orthopnea.  She has had pedal edema but has not been taking full dose Lasix.  She also is not following a low-sodium diet.  She denies chest pain, palpitations or syncope.  She recently lost her husband.  Current Outpatient Medications  Medication Sig Dispense Refill  . acetaminophen (TYLENOL) 325 MG tablet Take 650 mg by mouth every 6 (six) hours as needed for mild pain.     Marland Kitchen albuterol (PROVENTIL) (2.5 MG/3ML) 0.083% nebulizer solution Take 3 mLs (2.5 mg total) by nebulization every 2 (two) hours as needed for shortness of breath. 75 mL 12  . amiodarone (PACERONE) 200 MG tablet TAKE 1 TABLET (200 MG TOTAL) BY MOUTH DAILY. (Patient taking differently: Take 100 mg by mouth daily. TAKE 1 TABLET (200 MG TOTAL) BY MOUTH DAILY.) 90 tablet 3  . ARIPiprazole (ABILIFY) 5 MG tablet Take 5 mg by mouth every evening.    . budesonide-formoterol (SYMBICORT) 80-4.5 MCG/ACT inhaler Inhale 2 puffs into the lungs 2 (two) times daily.    . Calcium Carb-Cholecalciferol (CALCIUM 600+D) 600-800 MG-UNIT TABS Take 1 tablet by mouth daily.    . cloNIDine (CATAPRES - DOSED IN MG/24 HR) 0.3  mg/24hr patch Place 0.3 mg onto the skin every Friday.     . colchicine 0.6 MG tablet Take 0.6 mg by mouth daily.    . DULoxetine (CYMBALTA) 60 MG capsule Take 60 mg by mouth 2 (two) times daily.     . furosemide (LASIX) 40 MG tablet Take 20 mg by mouth daily. Pt takes 20 mg    . HUMULIN R 500 UNIT/ML injection Inject 8 units at 8am and 2pm and 2 units at 10pm.  1  . insulin glargine (LANTUS) 100 UNIT/ML injection Inject 0.15 mLs (15 Units total) into the skin at bedtime. 10 mL 11  . LamoTRIgine 200 MG TB24 24 hour tablet Take 1 tablet (200 mg total) by mouth at bedtime. 90 tablet 4  . levothyroxine (SYNTHROID, LEVOTHROID) 200 MCG tablet Take 1 tablet (200 mcg total) by mouth daily before breakfast. 90 tablet 3  . liraglutide (VICTOZA) 18 MG/3ML SOPN Inject 1.8 mg into the skin daily.    Marland Kitchen lubiprostone (AMITIZA) 8 MCG capsule Take 8 mcg by mouth 2 (two) times daily with a meal.    . metoprolol tartrate (LOPRESSOR) 50 MG tablet Take 50 mg by mouth 2 (two) times daily.  2  . nystatin (MYCOSTATIN/NYSTOP) powder Apply 1 g topically daily as needed (irritation).    . pantoprazole (PROTONIX) 40 MG tablet Take 40 mg by mouth 2 (two) times daily.     Marland Kitchen senna (SENOKOT) 8.6 MG TABS tablet Take 2 tablets by mouth at bedtime.     . traMADol (ULTRAM) 50 MG tablet Take by mouth every 12 (twelve) hours as needed.  No current facility-administered medications for this visit.      Past Medical History:  Diagnosis Date  . Abnormal liver function tests   . Acquired autoimmune hypothyroidism   . Basal cell carcinoma    Chest  . Cancer (HCC)    Breast  . Combined hyperlipidemia   . COPD with acute exacerbation ([place])   . Depression   . Diabetes mellitus   . Diabetes type 2, uncontrolled ([place])   . DM neuropathy with neurologic complication ([place])   . Fracture    Left wrist  . Goiter   . Gout   . History of gastroesophageal reflux (GERD)   . Hypertension   . Hypoglycemia associated with diabetes  ([place])   . Meningioma ([place])   . Obesity   . Renal insufficiency   . Seizure ([place])   . Shoulder fracture, right 07/2015  . Sleep apnea, obstructive   . Thyroiditis, autoimmune   . Type II diabetes mellitus with peripheral angiopathy ([place])   . Vertigo     Past Surgical History:  Procedure Laterality Date  . APPENDECTOMY    . BACK SURGERY    . BASAL CELL CARCINOMA EXCISION     Chest  . CHOLECYSTECTOMY    . CRANIOTOMY Right 05/08/2014   Procedure: CRANIOTOMY FOR MENINGIOMA;  Surgeon: Ashok Pall, MD;  Location: [facility];  Service: Neurosurgery;  Laterality: Right;  Right Craniotomy for meningioma  . ESOPHAGOGASTRODUODENOSCOPY (EGD) WITH PROPOFOL N/A 05/22/2014   Procedure: ESOPHAGOGASTRODUODENOSCOPY (EGD) WITH PROPOFOL;  Surgeon: Wonda Horner, MD;  Location: [facility] ENDOSCOPY;  Service: Endoscopy;  Laterality: N/A;  . LAPAROSCOPIC GASTRIC BANDING    . MASTECTOMY Left     Social History   Socioeconomic History  . Marital status: Married    Spouse name: Konrad Dolores  . Number of children: 2  . Years of education: 55  . Highest education level: Not on file  Occupational History  . Occupation: Retired  Scientific laboratory technician  . Financial resource strain: Not on file  . Food insecurity:    Worry: Not on file    Inability: Not on file  . Transportation needs:    Medical: Not on file    Non-medical: Not on file  Tobacco Use  . Smoking status: Never Smoker  . Smokeless tobacco: Never Used  Substance and Sexual Activity  . Alcohol use: No  . Drug use: No  . Sexual activity: Never  Lifestyle  . Physical activity:    Days per week: Not on file    Minutes per session: Not on file  . Stress: Not on file  Relationships  . Social connections:    Talks on phone: Not on file    Gets together: Not on file    Attends religious service: Not on file    Active member of club or organization: Not on file    Attends meetings of clubs or organizations: Not on file    Relationship status: Not on file    . Intimate partner violence:    Fear of current or ex partner: Not on file    Emotionally abused: Not on file    Physically abused: Not on file    Forced sexual activity: Not on file  Other Topics Concern  . Not on file  Social History Narrative   02/13/17 living at home with spouse   Right-handed.   No caffeine use.    Family History  Problem Relation Age of Onset  . Melanoma Mother   .  Diabetes Father   . CAD Father   . Diabetes Sister   . Diabetes Brother     ROS: no fevers or chills, productive cough, hemoptysis, dysphasia, odynophagia, melena, hematochezia, dysuria, hematuria, rash, seizure activity, orthopnea, PND, claudication. Remaining systems are negative.  Physical Exam: Well-developed obese in no acute distress.  Skin is warm and dry.  HEENT is normal.  Neck is supple.  Chest is clear to auscultation with normal expansion.  Cardiovascular exam is regular rate and rhythm.  Abdominal exam nontender or distended. No masses palpated. Extremities show trace to 1+ edema. neuro grossly intact   A/P  1 history of atrial flutter-patient is in sinus rhythm on examination today.  We will continue with amiodarone.  Check chest x-ray.  She had blood work drawn recently by primary care and we will ask for her recent TSH and liver functions.  Patient has had frequent falls in the past and we have therefore elected not to anticoagulate as risk outweighs benefit.  She also does not want to consider ablation given multiple comorbidities.  We will add aspirin 81 mg daily.  2 hypertension-blood pressure is controlled.  Continue present medications and follow.  3 hyperlipidemia-managed by primary care.  4 chronic diastolic congestive heart failure-she is mildly volume overloaded.  I have asked her to resume her Lasix at 40 mg daily (she was only taking 20 mg daily).  5 elevated TSH-previous TSH was 130.  We increased her Synthroid and asked her to follow-up with her primary care  physician.  I will have her most recent TSH sent to [place] from primary care.  Kirk Ruths, MD

## 2018-01-28 ENCOUNTER — Ambulatory Visit (INDEPENDENT_AMBULATORY_CARE_PROVIDER_SITE_OTHER): Payer: Medicare Other | Admitting: Cardiology

## 2018-01-28 ENCOUNTER — Encounter: Payer: Self-pay | Admitting: Cardiology

## 2018-01-28 ENCOUNTER — Ambulatory Visit (HOSPITAL_COMMUNITY)
Admission: RE | Admit: 2018-01-28 | Discharge: 2018-01-28 | Disposition: A | Discharge status: Home / Self Care | Payer: Medicare Other | Source: Ambulatory Visit | Attending: Cardiology | Admitting: Cardiology

## 2018-01-28 VITALS — BP 136/72 | HR 63 | Ht 65.0 in | Wt 242.0 lb

## 2018-01-28 DIAGNOSIS — E7800 Pure hypercholesterolemia, unspecified: Secondary | ICD-10-CM | POA: Diagnosis not present

## 2018-01-28 DIAGNOSIS — I517 Cardiomegaly: Secondary | ICD-10-CM | POA: Insufficient documentation

## 2018-01-28 DIAGNOSIS — I483 Typical atrial flutter: Secondary | ICD-10-CM

## 2018-01-28 DIAGNOSIS — I10 Essential (primary) hypertension: Secondary | ICD-10-CM

## 2018-01-28 MED ORDER — ASPIRIN EC 81 MG PO TBEC: 81.0000 mg | DELAYED_RELEASE_TABLET | Freq: Every day | ORAL | 3 refills | Status: DC

## 2018-01-28 NOTE — Patient Instructions (Signed)
Medication Instructions:  START ASPIRIN 81 MG ONCE DAILY If you need a refill on your cardiac medications before your next appointment, please call your pharmacy.   Lab work: If you have labs (blood work) drawn today and your tests are completely normal, you will receive your results only by: Marland Kitchen MyChart Message (if you have MyChart) OR . A paper copy in the mail If you have any lab test that is abnormal or we need to change your treatment, we will call you to review the results.  Testing/Procedures: A chest x-ray takes a picture of the organs and structures inside the chest, including the heart, lungs, and blood vessels. This test can show several things, including, whether the heart is enlarges; whether fluid is building up in the lungs; and whether pacemaker / defibrillator leads are still in place. AT [place]: At [facility], you and your health needs are our priority.  As part of our continuing mission to provide you with exceptional heart care, we have created designated Provider Care Teams.  These Care Teams include your primary Cardiologist (physician) and Advanced Practice Providers (APPs -  Physician Assistants and Nurse Practitioners) who all work together to provide you with the care you need, when you need it. You will need a follow up appointment in 6 months.  Please call our office 2 months in advance to schedule this appointment.  You may see Kirk Ruths MD or one of the following Advanced Practice Providers on your designated Care Team:   Kerin Ransom, PA-C Roby Lofts, [place] . Sande Rives, PA-C

## 2018-02-01 ENCOUNTER — Other Ambulatory Visit: Payer: Self-pay | Admitting: Neurosurgery

## 2018-02-01 DIAGNOSIS — D329 Benign neoplasm of meninges, unspecified: Secondary | ICD-10-CM

## 2018-02-09 ENCOUNTER — Other Ambulatory Visit: Payer: Self-pay

## 2018-02-09 ENCOUNTER — Encounter (HOSPITAL_COMMUNITY): Payer: Self-pay | Admitting: Emergency Medicine

## 2018-02-09 ENCOUNTER — Emergency Department (HOSPITAL_COMMUNITY): Payer: Medicare Other

## 2018-02-09 ENCOUNTER — Inpatient Hospital Stay (HOSPITAL_COMMUNITY)
Admission: EM | Admit: 2018-02-09 | Discharge: 2018-02-19 | DRG: 291 | Disposition: A | Discharge status: Skilled Nursing Facility | Payer: Medicare Other | Attending: Student in an Organized Health Care Education/Training Program | Admitting: Student in an Organized Health Care Education/Training Program

## 2018-02-09 DIAGNOSIS — Z85828 Personal history of other malignant neoplasm of skin: Secondary | ICD-10-CM

## 2018-02-09 DIAGNOSIS — I4892 Unspecified atrial flutter: Secondary | ICD-10-CM | POA: Diagnosis present

## 2018-02-09 DIAGNOSIS — D6489 Other specified anemias: Secondary | ICD-10-CM | POA: Diagnosis present

## 2018-02-09 DIAGNOSIS — Z6839 Body mass index (BMI) 39.0-39.9, adult: Secondary | ICD-10-CM

## 2018-02-09 DIAGNOSIS — E669 Obesity, unspecified: Secondary | ICD-10-CM | POA: Diagnosis present

## 2018-02-09 DIAGNOSIS — F419 Anxiety disorder, unspecified: Secondary | ICD-10-CM | POA: Diagnosis present

## 2018-02-09 DIAGNOSIS — Z808 Family history of malignant neoplasm of other organs or systems: Secondary | ICD-10-CM

## 2018-02-09 DIAGNOSIS — K5900 Constipation, unspecified: Secondary | ICD-10-CM | POA: Diagnosis present

## 2018-02-09 DIAGNOSIS — E1122 Type 2 diabetes mellitus with diabetic chronic kidney disease: Secondary | ICD-10-CM | POA: Diagnosis present

## 2018-02-09 DIAGNOSIS — Z833 Family history of diabetes mellitus: Secondary | ICD-10-CM

## 2018-02-09 DIAGNOSIS — I2510 Atherosclerotic heart disease of native coronary artery without angina pectoris: Secondary | ICD-10-CM | POA: Diagnosis present

## 2018-02-09 DIAGNOSIS — E782 Mixed hyperlipidemia: Secondary | ICD-10-CM | POA: Diagnosis present

## 2018-02-09 DIAGNOSIS — Z79890 Hormone replacement therapy (postmenopausal): Secondary | ICD-10-CM

## 2018-02-09 DIAGNOSIS — R32 Unspecified urinary incontinence: Secondary | ICD-10-CM | POA: Diagnosis present

## 2018-02-09 DIAGNOSIS — E1142 Type 2 diabetes mellitus with diabetic polyneuropathy: Secondary | ICD-10-CM | POA: Diagnosis present

## 2018-02-09 DIAGNOSIS — Z9012 Acquired absence of left breast and nipple: Secondary | ICD-10-CM

## 2018-02-09 DIAGNOSIS — N183 Chronic kidney disease, stage 3 (moderate): Secondary | ICD-10-CM | POA: Diagnosis present

## 2018-02-09 DIAGNOSIS — I5033 Acute on chronic diastolic (congestive) heart failure: Secondary | ICD-10-CM | POA: Diagnosis present

## 2018-02-09 DIAGNOSIS — R0602 Shortness of breath: Secondary | ICD-10-CM | POA: Diagnosis not present

## 2018-02-09 DIAGNOSIS — I130 Hypertensive heart and chronic kidney disease with heart failure and stage 1 through stage 4 chronic kidney disease, or unspecified chronic kidney disease: Principal | ICD-10-CM | POA: Diagnosis present

## 2018-02-09 DIAGNOSIS — Z9181 History of falling: Secondary | ICD-10-CM

## 2018-02-09 DIAGNOSIS — Z79899 Other long term (current) drug therapy: Secondary | ICD-10-CM

## 2018-02-09 DIAGNOSIS — Z794 Long term (current) use of insulin: Secondary | ICD-10-CM

## 2018-02-09 DIAGNOSIS — K219 Gastro-esophageal reflux disease without esophagitis: Secondary | ICD-10-CM | POA: Diagnosis present

## 2018-02-09 DIAGNOSIS — N179 Acute kidney failure, unspecified: Secondary | ICD-10-CM

## 2018-02-09 DIAGNOSIS — E1151 Type 2 diabetes mellitus with diabetic peripheral angiopathy without gangrene: Secondary | ICD-10-CM | POA: Diagnosis present

## 2018-02-09 DIAGNOSIS — Z7982 Long term (current) use of aspirin: Secondary | ICD-10-CM

## 2018-02-09 DIAGNOSIS — F32A Depression, unspecified: Secondary | ICD-10-CM

## 2018-02-09 DIAGNOSIS — G4733 Obstructive sleep apnea (adult) (pediatric): Secondary | ICD-10-CM | POA: Diagnosis present

## 2018-02-09 DIAGNOSIS — E063 Autoimmune thyroiditis: Secondary | ICD-10-CM | POA: Diagnosis present

## 2018-02-09 DIAGNOSIS — G40909 Epilepsy, unspecified, not intractable, without status epilepticus: Secondary | ICD-10-CM | POA: Diagnosis present

## 2018-02-09 DIAGNOSIS — I10 Essential (primary) hypertension: Secondary | ICD-10-CM | POA: Diagnosis present

## 2018-02-09 DIAGNOSIS — F329 Major depressive disorder, single episode, unspecified: Secondary | ICD-10-CM | POA: Diagnosis present

## 2018-02-09 DIAGNOSIS — Z66 Do not resuscitate: Secondary | ICD-10-CM | POA: Diagnosis present

## 2018-02-09 DIAGNOSIS — Z7951 Long term (current) use of inhaled steroids: Secondary | ICD-10-CM

## 2018-02-09 DIAGNOSIS — K7581 Nonalcoholic steatohepatitis (NASH): Secondary | ICD-10-CM | POA: Diagnosis present

## 2018-02-09 DIAGNOSIS — I5032 Chronic diastolic (congestive) heart failure: Secondary | ICD-10-CM | POA: Insufficient documentation

## 2018-02-09 DIAGNOSIS — Z86011 Personal history of benign neoplasm of the brain: Secondary | ICD-10-CM

## 2018-02-09 DIAGNOSIS — Z853 Personal history of malignant neoplasm of breast: Secondary | ICD-10-CM

## 2018-02-09 DIAGNOSIS — N184 Chronic kidney disease, stage 4 (severe): Secondary | ICD-10-CM | POA: Diagnosis present

## 2018-02-09 DIAGNOSIS — R45851 Suicidal ideations: Secondary | ICD-10-CM | POA: Diagnosis present

## 2018-02-09 DIAGNOSIS — I272 Pulmonary hypertension, unspecified: Secondary | ICD-10-CM | POA: Diagnosis present

## 2018-02-09 HISTORY — DX: Other specified postprocedural states: Z98.890

## 2018-02-09 LAB — CBG MONITORING, ED: GLUCOSE-CAPILLARY: 149 mg/dL — AB (ref 70–99)

## 2018-02-09 MED ORDER — ALBUTEROL SULFATE (2.5 MG/3ML) 0.083% IN NEBU
2.5000 mg | INHALATION_SOLUTION | RESPIRATORY_TRACT | Status: DC | PRN
  Filled 2018-02-09: qty 3

## 2018-02-09 MED ORDER — ALUM & MAG HYDROXIDE-SIMETH 200-200-20 MG/5ML PO SUSP: 30.0000 mL | Freq: Four times a day (QID) | ORAL | Status: DC | PRN

## 2018-02-09 MED ORDER — LORAZEPAM 1 MG PO TABS
1.0000 mg | ORAL_TABLET | Freq: Once | ORAL | Status: AC
  Administered 2018-02-09: 1 mg via ORAL
  Filled 2018-02-09: qty 1

## 2018-02-09 MED ORDER — CLONIDINE HCL 0.3 MG/24HR TD PTWK
0.3000 mg | MEDICATED_PATCH | TRANSDERMAL | Status: DC
  Administered 2018-02-15: 0.3 mg via TRANSDERMAL
  Filled 2018-02-09: qty 1

## 2018-02-09 MED ORDER — METOPROLOL TARTRATE 50 MG PO TABS
50.0000 mg | ORAL_TABLET | Freq: Two times a day (BID) | ORAL | Status: DC
  Administered 2018-02-10 – 2018-02-19 (×19): 50 mg via ORAL
  Filled 2018-02-09: qty 1
  Filled 2018-02-09 (×2): qty 2
  Filled 2018-02-09 (×9): qty 1
  Filled 2018-02-09: qty 2
  Filled 2018-02-09: qty 1
  Filled 2018-02-09: qty 2
  Filled 2018-02-09 (×4): qty 1

## 2018-02-09 MED ORDER — SENNA 8.6 MG PO TABS
2.0000 | ORAL_TABLET | Freq: Every day | ORAL | Status: DC
  Administered 2018-02-10 – 2018-02-15 (×6): 17.2 mg via ORAL
  Filled 2018-02-09 (×6): qty 2

## 2018-02-09 MED ORDER — HYDROCODONE-ACETAMINOPHEN 5-325 MG PO TABS
1.0000 | ORAL_TABLET | Freq: Once | ORAL | Status: AC
  Administered 2018-02-09: 1 via ORAL
  Filled 2018-02-09: qty 1

## 2018-02-09 MED ORDER — TRAMADOL HCL 50 MG PO TABS
50.0000 mg | ORAL_TABLET | Freq: Two times a day (BID) | ORAL | Status: DC | PRN
  Administered 2018-02-10 – 2018-02-11 (×2): 50 mg via ORAL
  Filled 2018-02-09 (×2): qty 1

## 2018-02-09 MED ORDER — LEVOTHYROXINE SODIUM 100 MCG PO TABS
200.0000 ug | ORAL_TABLET | Freq: Every day | ORAL | Status: DC
  Administered 2018-02-12 – 2018-02-19 (×8): 200 ug via ORAL
  Filled 2018-02-09 (×2): qty 2
  Filled 2018-02-09: qty 1
  Filled 2018-02-09 (×2): qty 2
  Filled 2018-02-09: qty 1
  Filled 2018-02-09 (×4): qty 2

## 2018-02-09 MED ORDER — LUBIPROSTONE 8 MCG PO CAPS
8.0000 ug | ORAL_CAPSULE | Freq: Two times a day (BID) | ORAL | Status: DC
  Administered 2018-02-10 – 2018-02-19 (×18): 8 ug via ORAL
  Filled 2018-02-09 (×20): qty 1

## 2018-02-09 MED ORDER — LIRAGLUTIDE 18 MG/3ML ~~LOC~~ SOPN
1.8000 mg | PEN_INJECTOR | Freq: Every day | SUBCUTANEOUS | Status: DC
  Administered 2018-02-11 – 2018-02-19 (×9): 1.8 mg via SUBCUTANEOUS
  Filled 2018-02-09 (×7): qty 3

## 2018-02-09 MED ORDER — PANTOPRAZOLE SODIUM 40 MG PO TBEC
40.0000 mg | DELAYED_RELEASE_TABLET | Freq: Two times a day (BID) | ORAL | Status: DC
  Administered 2018-02-10 – 2018-02-19 (×19): 40 mg via ORAL
  Filled 2018-02-09 (×19): qty 1

## 2018-02-09 MED ORDER — MOMETASONE FURO-FORMOTEROL FUM 100-5 MCG/ACT IN AERO
2.0000 | INHALATION_SPRAY | Freq: Two times a day (BID) | RESPIRATORY_TRACT | Status: DC
  Administered 2018-02-11 – 2018-02-19 (×15): 2 via RESPIRATORY_TRACT
  Filled 2018-02-09 (×2): qty 8.8

## 2018-02-09 MED ORDER — ASPIRIN EC 81 MG PO TBEC
81.0000 mg | DELAYED_RELEASE_TABLET | Freq: Every day | ORAL | Status: DC
  Administered 2018-02-10 – 2018-02-19 (×10): 81 mg via ORAL
  Filled 2018-02-09 (×10): qty 1

## 2018-02-09 MED ORDER — INSULIN GLARGINE 100 UNIT/ML ~~LOC~~ SOLN
15.0000 [IU] | Freq: Every day | SUBCUTANEOUS | Status: DC
  Filled 2018-02-09: qty 0.15

## 2018-02-09 MED ORDER — AMIODARONE HCL 100 MG PO TABS
100.0000 mg | ORAL_TABLET | Freq: Every day | ORAL | Status: DC
  Administered 2018-02-10 – 2018-02-19 (×10): 100 mg via ORAL
  Filled 2018-02-09 (×10): qty 1

## 2018-02-09 MED ORDER — ONDANSETRON HCL 4 MG PO TABS: 4.0000 mg | ORAL_TABLET | Freq: Three times a day (TID) | ORAL | Status: DC | PRN

## 2018-02-09 MED ORDER — LAMOTRIGINE 100 MG PO TABS
100.0000 mg | ORAL_TABLET | Freq: Two times a day (BID) | ORAL | Status: DC
  Administered 2018-02-10 – 2018-02-19 (×19): 100 mg via ORAL
  Filled 2018-02-09 (×22): qty 1

## 2018-02-09 NOTE — ED Notes (Signed)
When pt was asked if she has thoughts of hurting herself, pt states "yes I have" and would use the method of "overdose by taking her pills." Daughter at bedside states pt is rarely alone and that daughter sets out pills for her everyday. States husband was her primary caretaker.

## 2018-02-09 NOTE — ED Provider Notes (Signed)
[facility] EMERGENCY DEPARTMENT Provider Note   CSN: [id number] Arrival date & time: 02/09/18  2134     History   Chief Complaint Chief Complaint  Patient presents with  . Hypertension    HPI Morgan [name] is a 77 y.o. female.  HPI   77 year old female with multiple complaints.  She has been checking her blood pressure at home when she is consistently had readings in the 242P to 536 systolic.  She reports compliance with her medications.  Intermittent headaches for the past couple weeks.  She is concerned she has a past history of meningioma which was resected.  She has no acute neurological complaints.  No fevers.  She reports feeling very depressed and also anxious.  Her husband died in his sleep unexpectedly 2 months ago.  She reported to nursing that she has had suicidal thoughts by taking all of her medication.  Her son and daughter at bedside.  They report that they try to have somebody stay with her all the time since her husband's death.  They are in charge of giving her medications she does not have direct access to them.  Past Medical History:  Diagnosis Date  . Abnormal liver function tests   . Acquired autoimmune hypothyroidism   . Basal cell carcinoma    Chest  . Cancer (HCC)    Breast  . Combined hyperlipidemia   . COPD with acute exacerbation ([place])   . Depression   . Diabetes mellitus   . Diabetes type 2, uncontrolled ([place])   . 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 Fracture    Left wrist  . Goiter   . Gout   . History of gastroesophageal reflux (GERD)   . Hypertension   . Hypoglycemia associated with diabetes ([place])   . Meningioma ([place])   . Obesity   . Renal insufficiency   . Seizure ([place])   . Shoulder fracture, right 07/2015  . Sleep apnea, obstructive   . Thyroiditis, autoimmune   . Type II diabetes mellitus with peripheral angiopathy ([place])   . Vertigo     Patient Active Problem List   Diagnosis Date Noted  .  Hx of craniotomy 08/13/2017  . Abnormality of gait 08/23/2016  . Hypoglycemia 05/23/2016  . Generalized weakness 05/22/2016  . Acute metabolic encephalopathy due to hypoglycemia 05/22/2016  . Transaminitis 05/22/2016  . CKD (chronic kidney disease) stage 4, GFR 15-29 ml/min (HCC) 05/22/2016  . Urinary retention 05/22/2016  . Parkinsonism ([place]) 02/24/2016  . Delirium 08/11/2015  . Seizure disorder ([place]) 04/27/2015  . Atrial flutter ([place]) 03/15/2015  . Mitral regurgitation 03/15/2015  . Cognitive impairment 07/23/2014  . Peripheral neuropathy with Parkisonian features   . Essential hypertension   . Gastroesophageal reflux disease without esophagitis   . History of Meningioma    . Primary gout   . Acute encephalopathy 05/03/2014  . Abnormal head CT   . Left hemiparesis ([place])   . 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 Acquired autoimmune hypothyroidism   . Thyroiditis, autoimmune   . Abnormal liver function tests   . Sleep apnea, obstructive   . History of gastroesophageal reflux (GERD)   . Depression   . Type 2 diabetes mellitus with polyneuropathy ([place])   . COPD (chronic obstructive pulmonary disease) ([place])   . Goiter   . Vertigo   . Gout   . Mixed hyperlipidemia 08/01/2010  . Obesity 08/01/2010    Past Surgical History:  Procedure Laterality Date  .  APPENDECTOMY    . BACK SURGERY    . BASAL CELL CARCINOMA EXCISION     Chest  . CHOLECYSTECTOMY    . CRANIOTOMY Right 05/08/2014   Procedure: CRANIOTOMY FOR MENINGIOMA;  Surgeon: Ashok Pall, MD;  Location: [facility]UR[facility] ORS;  Service: Neurosurgery;  Laterality: Right;  Right Craniotomy for meningioma  . ESOPHAGOGASTRODUODENOSCOPY (EGD) WITH PROPOFOL N/A 05/22/2014   Procedure: ESOPHAGOGASTRODUODENOSCOPY (EGD) WITH PROPOFOL;  Surgeon: Wonda Horner, MD;  Location: [facility] ENDOSCOPY;  Service: Endoscopy;  Laterality: N/A;  . LAPAROSCOPIC GASTRIC BANDING    . MASTECTOMY Left      OB History   None      Home  Medications    Prior to Admission medications   Medication Sig Start Date End Date Taking? Authorizing Provider  acetaminophen (TYLENOL) 325 MG tablet Take 650 mg by mouth every 6 (six) hours as needed for mild pain.     [provider]  albuterol (PROVENTIL) (2.5 MG/3ML) 0.083% nebulizer solution Take 3 mLs (2.5 mg total) by nebulization every 2 (two) hours as needed for shortness of breath. 05/26/16   Ghimire, Henreitta Leber, MD  amiodarone (PACERONE) 200 MG tablet TAKE 1 TABLET (200 MG TOTAL) BY MOUTH DAILY. Patient taking differently: Take 100 mg by mouth daily. TAKE 1 TABLET (200 MG TOTAL) BY MOUTH DAILY. 06/12/17   [name], MD  ARIPiprazole (ABILIFY) 5 MG tablet Take 5 mg by mouth every evening.    [provider]  aspirin EC 81 MG tablet Take 1 tablet (81 mg total) by mouth daily. 01/28/18   [name], MD  budesonide-formoterol (SYMBICORT) 80-4.5 MCG/ACT inhaler Inhale 2 puffs into the lungs 2 (two) times daily. 07/05/16   [provider]  Calcium Carb-Cholecalciferol (CALCIUM 600+D) 600-800 MG-UNIT TABS Take 1 tablet by mouth daily.    [provider]  cloNIDine (CATAPRES - DOSED IN MG/24 HR) 0.3 mg/24hr patch Place 0.3 mg onto the skin every Friday.     [provider]  colchicine 0.6 MG tablet Take 0.6 mg by mouth daily.    [provider]  DULoxetine (CYMBALTA) 60 MG capsule Take 60 mg by mouth 2 (two) times daily.     [provider]  furosemide (LASIX) 40 MG tablet Take 20 mg by mouth daily. Pt takes 20 mg    [provider]  HUMULIN R 500 UNIT/ML injection Inject 8 units at 8am and 2pm and 2 units at 10pm. 01/26/17   [provider]  insulin glargine (LANTUS) 100 UNIT/ML injection Inject 0.15 mLs (15 Units total) into the skin at bedtime. 05/26/16   Ghimire, Henreitta Leber, MD  LamoTRIgine 200 MG TB24 24 hour tablet Take 1 tablet (200 mg total) by mouth at bedtime. 08/13/17   Marcial Pacas, MD    levothyroxine (SYNTHROID, LEVOTHROID) 200 MCG tablet Take 1 tablet (200 mcg total) by mouth daily before breakfast. 07/31/17   [name], MD  liraglutide (VICTOZA) 18 MG/3ML SOPN Inject 1.8 mg into the skin daily. 05/01/16   [provider]  lubiprostone (AMITIZA) 8 MCG capsule Take 8 mcg by mouth 2 (two) times daily with a meal.    [provider]  metoprolol tartrate (LOPRESSOR) 50 MG tablet Take 50 mg by mouth 2 (two) times daily. 01/22/17   [provider]  nystatin (MYCOSTATIN/NYSTOP) powder Apply 1 g topically daily as needed (irritation).    [provider]  pantoprazole (PROTONIX) 40 MG tablet Take 40 mg by mouth 2 (  two) times daily.     [provider]  senna (SENOKOT) 8.6 MG TABS tablet Take 2 tablets by mouth at bedtime.     [provider]  traMADol (ULTRAM) 50 MG tablet Take by mouth every 12 (twelve) hours as needed.    [provider]    Family History Family History  Problem Relation Age of Onset  . Melanoma Mother   . Diabetes Father   . CAD Father   . Diabetes Sister   . Diabetes Brother     Social History Social History   Tobacco Use  . Smoking status: Never Smoker  . Smokeless tobacco: Never Used  Substance Use Topics  . Alcohol use: No  . Drug use: No     Allergies   Latex; Wellbutrin [bupropion]; Keppra [levetiracetam]; Statins; [place] 10 mcg pen [exenatide]; Metformin; Penicillins; and Sulfa antibiotics   Review of Systems Review of Systems  All systems reviewed and negative, other than as noted in HPI.  Physical Exam Updated Vital Signs There were no vitals taken for this visit.  Physical Exam  Constitutional: She is oriented to person, place, and time. She appears well-developed and well-nourished. No distress.  HENT:  Head: Normocephalic and atraumatic.  Eyes: Conjunctivae are normal. Right eye exhibits no discharge. Left eye exhibits no discharge.  Neck: Neck supple.   Cardiovascular: Normal rate, regular rhythm and normal heart sounds. Exam reveals no gallop and no friction rub.  No murmur heard. Pulmonary/Chest: Effort normal and breath sounds normal. No respiratory distress.  Abdominal: Soft. She exhibits no distension. There is no tenderness.  Musculoskeletal: She exhibits no edema or tenderness.  Neurological: She is alert and oriented to person, place, and time. No cranial nerve deficit. She exhibits normal muscle tone. Coordination normal.  Skin: Skin is warm and dry.  Psychiatric:  Flat affect.  Crying at times.  Nursing note and vitals reviewed.    ED Treatments / Results  Labs (all labs ordered are listed, but only abnormal results are displayed) Labs Reviewed  CBC WITH DIFFERENTIAL/PLATELET - Abnormal; Notable for the following components:      Result Value   RBC 3.47 (*)    Hemoglobin 10.0 (*)    HCT 33.6 (*)    MCHC 29.8 (*)    Abs Immature Granulocytes 0.08 (*)    All other components within normal limits  BASIC METABOLIC PANEL - Abnormal; Notable for the following components:   Glucose, Bld 173 (*)    BUN 29 (*)    Creatinine, Ser 1.79 (*)    Calcium 8.6 (*)    GFR calc non Af Amer 26 (*)    GFR calc Af Amer 30 (*)    All other components within normal limits  URINALYSIS, ROUTINE W REFLEX MICROSCOPIC - Abnormal; Notable for the following components:   Glucose, UA 50 (*)    Protein, ur >=300 (*)    Bacteria, UA RARE (*)    Non Squamous Epithelial 0-5 (*)    All other components within normal limits  RAPID URINE DRUG SCREEN, HOSP PERFORMED - Abnormal; Notable for the following components:   Opiates POSITIVE (*)    All other components within normal limits  CBG MONITORING, ED - Abnormal; Notable for the following components:   Glucose-Capillary 149 (*)    All other components within normal limits  CBG MONITORING, ED - Abnormal; Notable for the following components:   Glucose-Capillary 165 (*)    All other components  within normal limits  CBG MONITORING, ED - Abnormal; Notable for the following components:   Glucose-Capillary 219 (*)    All other components within normal limits  CBG MONITORING, ED - Abnormal; Notable for the following components:   Glucose-Capillary 216 (*)    All other components within normal limits  CBG MONITORING, ED - Abnormal; Notable for the following components:   Glucose-Capillary 237 (*)    All other components within normal limits  CBG MONITORING, ED - Abnormal; Notable for the following components:   Glucose-Capillary 250 (*)    All other components within normal limits    EKG None  Radiology Ct Head Wo Contrast  Result Date: 02/10/2018 CLINICAL DATA:  Headache. History of meningioma resection 2016, breast cancer, hypertension. EXAM: CT HEAD WITHOUT CONTRAST TECHNIQUE: Contiguous axial images were obtained from the base of the skull through the vertex without intravenous contrast. COMPARISON:  MRI head August 07, 2017 and CT HEAD May 20, 2016. FINDINGS: Mild motion degraded examination. BRAIN: 12 mm lentiform RIGHT frontal extra-axial collection was 10 mm. Linear probable membrane formation with heterogeneous dense component. RIGHT frontal encephalomalacia subjacent to collection with ex vacuo dilatation RIGHT frontal horn of the lateral ventricle. No hydrocephalus. No midline shift or mass effect. No acute large vascular territory infarcts. No intraparenchymal hemorrhage. Basal cisterns are patent. VASCULAR: Mild calcific atherosclerosis of the carotid siphons. SKULL: No skull fracture. Old RIGHT frontal craniotomy. No scalp fluid collections. Tooth 9 periapical abscess versus periosteal reabsorption about implant. No significant scalp soft tissue swelling. Small RIGHT parietal tricholemmal cyst. SINUSES/ORBITS: Trace paranasal sinus mucosal thickening. Mastoid air cells are well aerated.The included ocular globes and orbital contents are non-suspicious. OTHER: None.  IMPRESSION: 1. 12 mm RIGHT frontal extra-axial collection was 10 mm with interval small volume hemorrhage, potential component of mineralization. No significant mass effect. 2. Status post RIGHT frontal craniotomy for meningioma resection with similar RIGHT frontal encephalomalacia. 3. Acute findings discussed with and reconfirmed by Dr.Olivine Hiers on 02/09/2018 at 12:14 am. Electronically Signed   By: Elon Alas M.D.   On: 02/10/2018 00:15    Procedures Procedures (including critical care time)  Medications Ordered in ED Medications - No data to display   Initial Impression / Assessment and Plan / ED Course  I have reviewed the triage vital signs and the nursing notes.  Pertinent labs & imaging results that were available during my care of the patient were reviewed by me and considered in my medical decision making (see chart for details).     77 year old female with hypertension.  Complaining of a headache as well.  He has meningioma resection with an MRI in follow-up earlier this year with a collection at the resection bed.  Will CT given her new headache to further evaluate.  Also relating that she is been having suicidal thoughts after the recent death of her husband.  She is medically cleared she will need a TTS evaluation. Final Clinical Impressions(s) / ED Diagnoses   Final diagnoses:  Essential hypertension  Depression, unspecified depression type  Suicidal thoughts    ED Discharge Orders    None       Virgel Manifold, MD 02/10/18 2329

## 2018-02-09 NOTE — ED Notes (Signed)
Patient transported to CT 

## 2018-02-09 NOTE — ED Triage Notes (Signed)
Pt has been taking BP at home for last few days. 081 systolic. Pt has a headache currently and has "fluid leak in brain pending surgery." pt anxious. Pt lost husband recently. Pt just lost ability to drive. Pt AO x 4

## 2018-02-10 ENCOUNTER — Other Ambulatory Visit: Payer: Self-pay

## 2018-02-10 LAB — URINALYSIS, ROUTINE W REFLEX MICROSCOPIC
Bilirubin Urine: NEGATIVE
Glucose, UA: 50 mg/dL — AB
Hgb urine dipstick: NEGATIVE
Ketones, ur: NEGATIVE mg/dL
Leukocytes, UA: NEGATIVE
Nitrite: NEGATIVE
Protein, ur: >=300 mg/dL — AB
Specific Gravity, Urine: 1.014 (ref 1.005–1.030)
pH: 7 (ref 5.0–8.0)

## 2018-02-10 LAB — RAPID URINE DRUG SCREEN, HOSP PERFORMED
Amphetamines: NOT DETECTED
Barbiturates: NOT DETECTED
Benzodiazepines: NOT DETECTED
Cocaine: NOT DETECTED
Opiates: POSITIVE — AB
Tetrahydrocannabinol: NOT DETECTED

## 2018-02-10 LAB — BASIC METABOLIC PANEL
Anion gap: 11 (ref 5–15)
BUN: 29 mg/dL — ABNORMAL HIGH (ref 8–23)
CO2: 25 mmol/L (ref 22–32)
Calcium: 8.6 mg/dL — ABNORMAL LOW (ref 8.9–10.3)
Chloride: 100 mmol/L (ref 98–111)
Creatinine, Ser: 1.79 mg/dL — ABNORMAL HIGH (ref 0.44–1.00)
GFR calc Af Amer: 30 mL/min — ABNORMAL LOW (ref >60)
GFR calc non Af Amer: 26 mL/min — ABNORMAL LOW (ref >60)
Glucose, Bld: 173 mg/dL — ABNORMAL HIGH (ref 70–99)
Potassium: 4.8 mmol/L (ref 3.5–5.1)
Sodium: 136 mmol/L (ref 135–145)

## 2018-02-10 LAB — CBG MONITORING, ED
GLUCOSE-CAPILLARY: 219 mg/dL — AB (ref 70–99)
GLUCOSE-CAPILLARY: 237 mg/dL — AB (ref 70–99)
Glucose-Capillary: 165 mg/dL — ABNORMAL HIGH (ref 70–99)
Glucose-Capillary: 216 mg/dL — ABNORMAL HIGH (ref 70–99)
Glucose-Capillary: 250 mg/dL — ABNORMAL HIGH (ref 70–99)

## 2018-02-10 LAB — CBC WITH DIFFERENTIAL/PLATELET
Abs Immature Granulocytes: 0.08 10*3/uL — ABNORMAL HIGH (ref 0.00–0.07)
Basophils Absolute: 0.1 10*3/uL (ref 0.0–0.1)
Basophils Relative: 1 %
Eosinophils Absolute: 0.3 10*3/uL (ref 0.0–0.5)
Eosinophils Relative: 4 %
HCT: 33.6 % — ABNORMAL LOW (ref 36.0–46.0)
Hemoglobin: 10 g/dL — ABNORMAL LOW (ref 12.0–15.0)
Immature Granulocytes: 1 %
Lymphocytes Relative: 26 %
Lymphs Abs: 2.2 10*3/uL (ref 0.7–4.0)
MCH: 28.8 pg (ref 26.0–34.0)
MCHC: 29.8 g/dL — ABNORMAL LOW (ref 30.0–36.0)
MCV: 96.8 fL (ref 80.0–100.0)
Monocytes Absolute: 0.7 10*3/uL (ref 0.1–1.0)
Monocytes Relative: 8 %
Neutro Abs: 4.9 10*3/uL (ref 1.7–7.7)
Neutrophils Relative %: 60 %
Platelets: 194 10*3/uL (ref 150–400)
RBC: 3.47 MIL/uL — ABNORMAL LOW (ref 3.87–5.11)
RDW: 14.3 % (ref 11.5–15.5)
WBC: 8.2 10*3/uL (ref 4.0–10.5)
nRBC: 0 % (ref 0.0–0.2)

## 2018-02-10 MED ORDER — DULOXETINE HCL 60 MG PO CPEP
60.0000 mg | ORAL_CAPSULE | Freq: Two times a day (BID) | ORAL | Status: DC
  Administered 2018-02-10 – 2018-02-19 (×19): 60 mg via ORAL
  Filled 2018-02-10 (×22): qty 1

## 2018-02-10 MED ORDER — LEVOTHYROXINE SODIUM 200 MCG PO TABS: 200.0000 ug | ORAL_TABLET | Freq: Every day | ORAL | Status: DC

## 2018-02-10 MED ORDER — AMIODARONE HCL 200 MG PO TABS: 100.0000 mg | ORAL_TABLET | Freq: Every day | ORAL | Status: DC

## 2018-02-10 MED ORDER — MOMETASONE FURO-FORMOTEROL FUM 100-5 MCG/ACT IN AERO: 2.0000 | INHALATION_SPRAY | Freq: Two times a day (BID) | RESPIRATORY_TRACT | Status: DC

## 2018-02-10 MED ORDER — ONDANSETRON HCL 4 MG PO TABS
4.0000 mg | ORAL_TABLET | Freq: Three times a day (TID) | ORAL | Status: DC | PRN
  Administered 2018-02-10: 4 mg via ORAL
  Filled 2018-02-10: qty 1

## 2018-02-10 MED ORDER — ALUM & MAG HYDROXIDE-SIMETH 200-200-20 MG/5ML PO SUSP: 30.0000 mL | Freq: Four times a day (QID) | ORAL | Status: DC | PRN

## 2018-02-10 MED ORDER — METOPROLOL TARTRATE 25 MG PO TABS: 50.0000 mg | ORAL_TABLET | Freq: Two times a day (BID) | ORAL | Status: DC

## 2018-02-10 MED ORDER — ACETAMINOPHEN 325 MG PO TABS
650.0000 mg | ORAL_TABLET | ORAL | Status: DC | PRN
  Administered 2018-02-10 – 2018-02-15 (×7): 650 mg via ORAL
  Filled 2018-02-10 (×7): qty 2

## 2018-02-10 MED ORDER — LAMOTRIGINE 100 MG PO TABS: 100.0000 mg | ORAL_TABLET | Freq: Two times a day (BID) | ORAL | Status: DC

## 2018-02-10 MED ORDER — INSULIN ASPART 100 UNIT/ML ~~LOC~~ SOLN
0.0000 [IU] | Freq: Three times a day (TID) | SUBCUTANEOUS | Status: DC
  Administered 2018-02-10: 5 [IU] via SUBCUTANEOUS
  Administered 2018-02-11: 8 [IU] via SUBCUTANEOUS
  Administered 2018-02-11: 5 [IU] via SUBCUTANEOUS
  Administered 2018-02-11: 8 [IU] via SUBCUTANEOUS
  Filled 2018-02-10 (×4): qty 1

## 2018-02-10 MED ORDER — FUROSEMIDE 20 MG PO TABS
40.0000 mg | ORAL_TABLET | Freq: Every day | ORAL | Status: DC
  Administered 2018-02-10 – 2018-02-11 (×2): 40 mg via ORAL
  Filled 2018-02-10 (×2): qty 2

## 2018-02-10 MED ORDER — ALPRAZOLAM 0.5 MG PO TABS
0.5000 mg | ORAL_TABLET | Freq: Three times a day (TID) | ORAL | Status: DC | PRN
  Administered 2018-02-10: 0.5 mg via ORAL
  Filled 2018-02-10 (×2): qty 1

## 2018-02-10 MED ORDER — ARIPIPRAZOLE 5 MG PO TABS
5.0000 mg | ORAL_TABLET | Freq: Every evening | ORAL | Status: DC
  Administered 2018-02-10 – 2018-02-18 (×9): 5 mg via ORAL
  Filled 2018-02-10 (×9): qty 1

## 2018-02-10 MED ORDER — ADULT MULTIVITAMIN W/MINERALS CH
1.0000 | ORAL_TABLET | Freq: Every day | ORAL | Status: DC
  Administered 2018-02-10 – 2018-02-19 (×10): 1 via ORAL
  Filled 2018-02-10 (×10): qty 1

## 2018-02-10 MED ORDER — CALCIUM CARBONATE-VITAMIN D 500-200 MG-UNIT PO TABS
1.0000 | ORAL_TABLET | Freq: Every day | ORAL | Status: DC
  Administered 2018-02-10 – 2018-02-19 (×10): 1 via ORAL
  Filled 2018-02-10 (×10): qty 1

## 2018-02-10 MED ORDER — ASPIRIN 81 MG PO CHEW: 81.0000 mg | CHEWABLE_TABLET | Freq: Every day | ORAL | Status: DC

## 2018-02-10 MED ORDER — PANTOPRAZOLE SODIUM 40 MG PO TBEC: 40.0000 mg | DELAYED_RELEASE_TABLET | Freq: Two times a day (BID) | ORAL | Status: DC

## 2018-02-10 MED ORDER — INSULIN REGULAR HUMAN (CONC) 500 UNIT/ML ~~LOC~~ SOLN: 5.0000 [IU] | Freq: Three times a day (TID) | SUBCUTANEOUS | Status: DC

## 2018-02-10 MED ORDER — COLCHICINE 0.6 MG PO TABS: 0.6000 mg | ORAL_TABLET | Freq: Every day | ORAL | Status: DC | PRN

## 2018-02-10 MED ORDER — VITAMIN B-12 1000 MCG PO TABS
1000.0000 ug | ORAL_TABLET | Freq: Every day | ORAL | Status: DC
  Administered 2018-02-10 – 2018-02-19 (×10): 1000 ug via ORAL
  Filled 2018-02-10 (×11): qty 1

## 2018-02-10 MED ORDER — LUBIPROSTONE 8 MCG PO CAPS: 8.0000 ug | ORAL_CAPSULE | Freq: Two times a day (BID) | ORAL | Status: DC

## 2018-02-10 NOTE — BH Assessment (Signed)
Tele Assessment Note   Patient Name: Morgan [name] MRN: [id number] Referring Physician: Delora Fuel Location of Patient: [facility] Location of Provider: [place] is an 77 y.o. female who presented to [facility] and reported that she was having suicidal thoughts to overdose on pills.  Patient is disabled and is currently living alone.  Her husband of fifty-six years and who was also her primary caregiver died two months ago.  Patient states that since he is gone that she feels like she has become a burden to her children.  Patient states that she has experienced thoughts of killing herself so her kids will not have to take care of her.  When asked if she would actually do it, she states, "I don't know."  Patient has been struggling with depression since she has become disabled and has been taking anti-depressants as prescribed by her PCP.  Patient states that she has never had any formal mental health treatment on an inpatient or outpatient basis.  Patient denies any prior attempts of self-harm or self-mutilation.  Patient states that she has never experienced any homicidal thought or features of psychosis.  Patient denies any history of substance abuse.  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  Patient states that she has two children ages 56 and 52.  She states that she has been sleeping on average seven hours per night and states that her appetite has been good and states that she has recently gained eight pounds.  Patient states: "I am hungry now."  Patient states that she gets a little confused at times and states, "I don't know what I am doing."  Patient has ambulatory issues and requires a walker and person assistance with her ADLS.  Patient states that she has no history of abuse or legal issues.  TTS spoke patient's children,  Loriana Samad [phone] and Adele Barthel [phone]) who were in the room with the patient and they indicated that patient is  only left alone at home a few hours a day and patient's daughter stated that she manages her medications.  They state that their mother (patient) has been very sad since their father died and also due to her inability to care for herself.  They were asked if they felt like their mother could benefit from a psychiatric admission, but they were not sure.  TTS asked them to talk about it as a family and report back to TTS their thoughts.  TTS notified nurse to call TTS once family had weighed in on their options.  Gero-psych placement was offered.  Patient presented as alert and oriented.  Her memory intact and her thoughts were organized.  Patient stated that she was experiencing some confusion and concentration issues at times.  Patient's mood was sad and depressed and her affect was flat.  Her insight, judgment and impulse control were mildly impaired.  Her psycho-motor activity was normal.  She did not appear to be responding to internal stimuli.   Diagnosis: F33.2 MDD Recurrent Severe without Psychotic features  Past Medical History:  Past Medical History:  Diagnosis Date  . Abnormal liver function tests   . Acquired autoimmune hypothyroidism   . Basal cell carcinoma    Chest  . Cancer (HCC)    Breast  . Combined hyperlipidemia   . COPD with acute exacerbation ([place])   . Depression   . Diabetes mellitus   . Diabetes type 2, uncontrolled ([place])   . DM neuropathy  with neurologic complication ([place])   . Fracture    Left wrist  . Goiter   . Gout   . History of gastroesophageal reflux (GERD)   . Hypertension   . Hypoglycemia associated with diabetes ([place])   . Meningioma ([place])   . Obesity   . Renal insufficiency   . Seizure ([place])   . Shoulder fracture, right 07/2015  . Sleep apnea, obstructive   . Thyroiditis, autoimmune   . Type II diabetes mellitus with peripheral angiopathy ([place])   . Vertigo     Past Surgical History:  Procedure Laterality Date  . APPENDECTOMY    . BACK SURGERY     . BASAL CELL CARCINOMA EXCISION     Chest  . CHOLECYSTECTOMY    . CRANIOTOMY Right 05/08/2014   Procedure: CRANIOTOMY FOR MENINGIOMA;  Surgeon: Ashok Pall, MD;  Location: [facility];  Service: Neurosurgery;  Laterality: Right;  Right Craniotomy for meningioma  . ESOPHAGOGASTRODUODENOSCOPY (EGD) WITH PROPOFOL N/A 05/22/2014   Procedure: ESOPHAGOGASTRODUODENOSCOPY (EGD) WITH PROPOFOL;  Surgeon: Wonda Horner, MD;  Location: [facility] ENDOSCOPY;  Service: Endoscopy;  Laterality: N/A;  . LAPAROSCOPIC GASTRIC BANDING    . MASTECTOMY Left     Family History:  Family History  Problem Relation Age of Onset  . Melanoma Mother   . Diabetes Father   . CAD Father   . Diabetes Sister   . Diabetes Brother     Social History:  reports that she has never smoked. She has never used smokeless tobacco. She reports that she does not drink alcohol or use drugs.  Additional Social History:  Alcohol / Drug Use Pain Medications: see MAR Prescriptions: see MAR Over the Counter: see MAR History of alcohol / drug use?: No history of alcohol / drug abuse Longest period of sobriety (when/how long): N/A  CIWA: CIWA-Ar BP: (!) 177/84 Pulse Rate: 68 COWS:    Allergies:  Allergies  Allergen Reactions  . Latex Rash  . Wellbutrin [Bupropion] Other (See Comments)    Unable to stand.   . Keppra [Levetiracetam] Other (See Comments)    Dementia.  . Statins   . Byetta 10 Mcg Pen [Exenatide] Rash  . Metformin Rash    RENAL INSUFFICIENCY-takes low dose  . Penicillins Rash  . Sulfa Antibiotics Other (See Comments)    Unknown allergic reaction    Home Medications:  (Not in a hospital admission)  OB/GYN Status:  No LMP recorded. Patient is postmenopausal.  General Assessment Data Assessment unable to be completed: Yes Reason for not completing assessment: Multiple assessments ordered simultaneously Location of Assessment: [facility] ED TTS Assessment: In system Is this a Tele or Face-to-Face Assessment?:  Tele Assessment Is this an Initial Assessment or a Re-assessment for this encounter?: Initial Assessment Patient Accompanied by:: Other(children) Language Other than English: No Living Arrangements: Other (Comment)(lives alone in her own home, but has sitters) What gender do you identify as?: Female Marital status: Widowed Foreman name: (Powers) Pregnancy Status: No Living Arrangements: Alone Can pt return to current living arrangement?: Yes Admission Status: Voluntary Is patient capable of signing voluntary admission?: Yes Referral Source: Self/Family/Friend Insurance type: Medicare     Crisis Care Plan Living Arrangements: Alone Legal Guardian: Other:(self) Name of Psychiatrist: none Name of Therapist: none  Education Status Is patient currently in school?: No Is the patient employed, unemployed or receiving disability?: Receiving disability income(retired)  Risk to self with the past 6 months Suicidal Ideation: Yes-Currently Present Has patient been a risk to self  within the past 6 months prior to admission? : No Suicidal Intent: (questionable) Has patient had any suicidal intent within the past 6 months prior to admission? : No Is patient at risk for suicide?: Yes Suicidal Plan?: Yes-Currently Present Has patient had any suicidal plan within the past 6 months prior to admission? : No Specify Current Suicidal Plan: (overdose) Access to Means: Yes Specify Access to Suicidal Means: (Rx pills) What has been your use of drugs/alcohol within the last 12 months?: none Previous Attempts/Gestures: No How many times?: 0 Other Self Harm Risks: (recent death of husband) Triggers for Past Attempts: None known Intentional Self Injurious Behavior: None Family Suicide History: No Recent stressful life event(s): Loss (Comment)(husband died two months ago) Persecutory voices/beliefs?: No Depression: Yes Depression Symptoms: Despondent, Isolating, Loss of interest in usual pleasures,  Feeling worthless/self pity Substance abuse history and/or treatment for substance abuse?: No Suicide prevention information given to non-admitted patients: Not applicable  Risk to Others within the past 6 months Homicidal Ideation: No Does patient have any lifetime risk of violence toward others beyond the six months prior to admission? : No Thoughts of Harm to Others: No Current Homicidal Intent: No Current Homicidal Plan: No Access to Homicidal Means: No Identified Victim: none History of harm to others?: No Assessment of Violence: None Noted Violent Behavior Description: (none) Does patient have access to weapons?: No Criminal Charges Pending?: No Does patient have a court date: No Is patient on probation?: No  Psychosis Hallucinations: None noted Delusions: None noted  Mental Status Report Appearance/Hygiene: Unremarkable Eye Contact: Good Motor Activity: Unremarkable Speech: Unremarkable Level of Consciousness: Alert Mood: Depressed Affect: Flat Anxiety Level: Minimal Thought Processes: Coherent, Relevant Judgement: Impaired Orientation: Person, Place, Time, Situation Obsessive Compulsive Thoughts/Behaviors: None  Cognitive Functioning Concentration: Decreased Memory: Recent Intact, Remote Intact Is patient IDD: No Insight: Fair Impulse Control: Fair Appetite: Good Have you had any weight changes? : Gain Amount of the weight change? (lbs): 8 lbs Sleep: No Change Total Hours of Sleep: 8 Vegetative Symptoms: None  ADLScreening [facility] Assessment Services) Patient's cognitive ability adequate to safely complete daily activities?: Yes Patient able to express need for assistance with ADLs?: Yes Independently performs ADLs?: No  Prior Inpatient Therapy Prior Inpatient Therapy: No  Prior Outpatient Therapy Prior Outpatient Therapy: No Does patient have an ACCT team?: No Does patient have Intensive In-House Services?  : No Does patient have [facility] services?  : No Does patient have P4CC services?: No  ADL Screening (condition at time of admission) Patient's cognitive ability adequate to safely complete daily activities?: Yes Is the patient deaf or have difficulty hearing?: No Does the patient have difficulty seeing, even when wearing glasses/contacts?: No Does the patient have difficulty concentrating, remembering, or making decisions?: No Patient able to express need for assistance with ADLs?: Yes Does the patient have difficulty dressing or bathing?: Yes Independently performs ADLs?: No Communication: Independent Dressing (OT): Needs assistance Grooming: Needs assistance Feeding: Independent Bathing: Needs assistance Toileting: Needs assistance In/Out Bed: Needs assistance Walks in Home: Needs assistance Does the patient have difficulty walking or climbing stairs?: Yes Weakness of Legs: Both Weakness of Arms/Hands: Both  Home Assistive Devices/Equipment Home Assistive Devices/Equipment: Grab bars around toilet, Grab bars in shower, Walker (specify type)  Therapy Consults (therapy consults require a physician order) PT Evaluation Needed: No OT Evalulation Needed: No SLP Evaluation Needed: No Abuse/Neglect Assessment (Assessment to be complete while patient is alone) Abuse/Neglect Assessment Can Be Completed: Yes Physical Abuse: Denies Verbal Abuse:  Denies Sexual Abuse: Denies Exploitation of patient/patient's resources: Denies Self-Neglect: Denies Values / Beliefs Cultural Requests During Hospitalization: None Spiritual Requests During Hospitalization: None Consults Spiritual Care Consult Needed: No Social Work Consult Needed: No Regulatory affairs officer (For Healthcare) Does Patient Have a Medical Advance Directive?: No Would patient like information on creating a medical advance directive?: No - Patient declined Nutrition Screen- [facility] Adult/WL/AP Has the patient recently lost weight without trying?: No Has the patient been  eating poorly because of a decreased appetite?: No Malnutrition Screening Tool Score: 0        Disposition: Per Shuvon Rankin, NP, Gero-psych recommended per patient's family's request Disposition Initial Assessment Completed for this Encounter: Yes  This service was provided via telemedicine using a 2-way, interactive audio and video technology.  Names of all persons participating in this telemedicine service and their role in this encounter. Name: Tallula Grindle Role: patient  Name: Adele Barthel Role:patient's daughter  Name: Brigitte Soderberg Role: patient's son  Name: Kasandra Knudsen Stacie Templin Role: TTS    Judeth Porch Reola Buckles 02/10/2018 9:34 AM

## 2018-02-10 NOTE — ED Provider Notes (Signed)
Care assumed from [place], patient with multiple complaints and CT of the head pending.  CT shows an extra-axial fluid collection which is slightly increased from previous with small area of hemorrhage.  I have spoken with the neurosurgeon on-call who has reviewed the CT scan and feels that these are chronic findings and do not require any intervention.  She is being sent for TTS evaluation.  Results for orders placed or performed during the hospital encounter of 02/09/18  CBC with Differential  Result Value Ref Range   WBC 8.2 4.0 - 10.5 K/uL   RBC 3.47 (L) 3.87 - 5.11 MIL/uL   Hemoglobin 10.0 (L) 12.0 - 15.0 g/dL   HCT 33.6 (L) 36.0 - 46.0 %   MCV 96.8 80.0 - 100.0 fL   MCH 28.8 26.0 - 34.0 pg   MCHC 29.8 (L) 30.0 - 36.0 g/dL   RDW 14.3 11.5 - 15.5 %   Platelets 194 150 - 400 K/uL   nRBC 0.0 0.0 - 0.2 %   Neutrophils Relative % 60 %   Neutro Abs 4.9 1.7 - 7.7 K/uL   Lymphocytes Relative 26 %   Lymphs Abs 2.2 0.7 - 4.0 K/uL   Monocytes Relative 8 %   Monocytes Absolute 0.7 0.1 - 1.0 K/uL   Eosinophils Relative 4 %   Eosinophils Absolute 0.3 0.0 - 0.5 K/uL   Basophils Relative 1 %   Basophils Absolute 0.1 0.0 - 0.1 K/uL   Immature Granulocytes 1 %   Abs Immature Granulocytes 0.08 (H) 0.00 - 0.07 K/uL  Basic metabolic panel  Result Value Ref Range   Sodium 136 135 - 145 mmol/L   Potassium 4.8 3.5 - 5.1 mmol/L   Chloride 100 98 - 111 mmol/L   CO2 25 22 - 32 mmol/L   Glucose, Bld 173 (H) 70 - 99 mg/dL   BUN 29 (H) 8 - 23 mg/dL   Creatinine, Ser 1.79 (H) 0.44 - 1.00 mg/dL   Calcium 8.6 (L) 8.9 - 10.3 mg/dL   GFR calc non Af Amer 26 (L) >60 mL/min   GFR calc Af Amer 30 (L) >60 mL/min   Anion gap 11 5 - 15  CBG monitoring, ED  Result Value Ref Range   Glucose-Capillary 149 (H) 70 - 99 mg/dL   Dg Chest 2 View  Result Date: 01/29/2018 CLINICAL DATA:  Shortness of breath and chest pain EXAM: CHEST - 2 VIEW COMPARISON:  07/30/2017 FINDINGS: Chronic interstitial coarsening. There  is no edema, consolidation, effusion, or pneumothorax. Low volume chest. Cardiomegaly and upper mediastinal widening, distorted by rotation. Advanced right glenohumeral osteoarthritis. IMPRESSION: 1. No acute finding. 2. Low volumes and cardiomegaly. Electronically Signed   By: Monte Fantasia M.D.   On: 01/29/2018 08:29   Ct Head Wo Contrast  Result Date: 02/10/2018 CLINICAL DATA:  Headache. History of meningioma resection 2016, breast cancer, hypertension. EXAM: CT HEAD WITHOUT CONTRAST TECHNIQUE: Contiguous axial images were obtained from the base of the skull through the vertex without intravenous contrast. COMPARISON:  MRI head August 07, 2017 and CT HEAD May 20, 2016. FINDINGS: Mild motion degraded examination. BRAIN: 12 mm lentiform RIGHT frontal extra-axial collection was 10 mm. Linear probable membrane formation with heterogeneous dense component. RIGHT frontal encephalomalacia subjacent to collection with ex vacuo dilatation RIGHT frontal horn of the lateral ventricle. No hydrocephalus. No midline shift or mass effect. No acute large vascular territory infarcts. No intraparenchymal hemorrhage. Basal cisterns are patent. VASCULAR: Mild calcific atherosclerosis of the carotid siphons. SKULL:  No skull fracture. Old RIGHT frontal craniotomy. No scalp fluid collections. Tooth 9 periapical abscess versus periosteal reabsorption about implant. No significant scalp soft tissue swelling. Small RIGHT parietal tricholemmal cyst. SINUSES/ORBITS: Trace paranasal sinus mucosal thickening. Mastoid air cells are well aerated.The included ocular globes and orbital contents are non-suspicious. OTHER: None. IMPRESSION: 1. 12 mm RIGHT frontal extra-axial collection was 10 mm with interval small volume hemorrhage, potential component of mineralization. No significant mass effect. 2. Status post RIGHT frontal craniotomy for meningioma resection with similar RIGHT frontal encephalomalacia. 3. Acute findings discussed  with and reconfirmed by Dr.STEPHEN KOHUT on 02/09/2018 at 12:14 am. Electronically Signed   By: Elon Alas M.D.   On: 02/10/2018 00:15   Images viewed by me.    Delora Fuel, MD 08/26/31 [phone]

## 2018-02-10 NOTE — ED Notes (Signed)
Discussion with the daughter about the pts medicines  Still waiting for tts

## 2018-02-10 NOTE — Progress Notes (Signed)
Patient meets criteria for inpatient treatment--geropsych placement per [facility] Rankin NP. CSW faxed referrals to the following facilities for review:  [facility], [facility], [facility], [facility], [facility], [facility]   TTS will continue to seek bed placement.   Maxie Better, MSW, LCSW Clinical Social Worker 02/10/2018 10:19 AM

## 2018-02-10 NOTE — ED Notes (Signed)
Talking with pharmacy about the pts medicines  According to the daughter the pt was given her pm medicines at 1700 before she arrived here  No meds given at this time

## 2018-02-10 NOTE — ED Notes (Addendum)
Morgan [name] - daughter - [phone]. Morgan [name] - son - [phone] - Leaving at this time. Took ALL of pt's belongings EXCEPT PT WEARING EYEGLASSES AND PT'S [place] SENT TO PHARMACY SO MAY BE DISPENSED.

## 2018-02-10 NOTE — ED Notes (Signed)
Per report, pt's CBG checked prior to pt's arrival to [place].

## 2018-02-10 NOTE — ED Notes (Signed)
Family still in the process of making decision.

## 2018-02-10 NOTE — ED Notes (Signed)
Spoke with counselor with behavioral health. He discussed options with family. Awaiting family to make a decision as to inpatient or 24 hour care at home.

## 2018-02-10 NOTE — ED Notes (Signed)
Check CBG 165, RN Chris informed

## 2018-02-10 NOTE — ED Notes (Signed)
Spoke with [employer], pt will be next to be assessed.

## 2018-02-10 NOTE — ED Notes (Signed)
Pt eating lunch family at bedside.

## 2018-02-10 NOTE — ED Notes (Signed)
Per Pharmacy, Victoza is the only med on her list that family will need to leave d/t hospital does not have on hand.

## 2018-02-10 NOTE — ED Notes (Signed)
Pt arrived to [facility] via stretcher. Pt noted to be alert, oriented, calm, cooperative. Daughter and son at bedside - requesting for pt to be Inpt -  Geri-Psych. Voiced understanding of Medical Clearance Pt Policy. Daughter states pt's CBG fluctuates easily and asked for Insulin to be given during meal times. Daughter attempting to leave pt's insulin needles in ED d/t "pt's insulin is at such a high concentration, it requires smaller needles" - advised daughter she will need to take these home - voiced understanding. Also, states "she must have a snack at bedtime or her sugar drops". Advised pt will be offered snack at snack times and CBG's will be monitored.

## 2018-02-10 NOTE — ED Notes (Signed)
New PureWick applied to pt.

## 2018-02-11 ENCOUNTER — Emergency Department (HOSPITAL_COMMUNITY): Payer: Medicare Other

## 2018-02-11 DIAGNOSIS — R45851 Suicidal ideations: Secondary | ICD-10-CM | POA: Diagnosis present

## 2018-02-11 DIAGNOSIS — Z79890 Hormone replacement therapy (postmenopausal): Secondary | ICD-10-CM | POA: Diagnosis not present

## 2018-02-11 DIAGNOSIS — I5030 Unspecified diastolic (congestive) heart failure: Secondary | ICD-10-CM | POA: Diagnosis not present

## 2018-02-11 DIAGNOSIS — R0602 Shortness of breath: Secondary | ICD-10-CM | POA: Diagnosis present

## 2018-02-11 DIAGNOSIS — N183 Chronic kidney disease, stage 3 (moderate): Secondary | ICD-10-CM | POA: Diagnosis present

## 2018-02-11 DIAGNOSIS — I5032 Chronic diastolic (congestive) heart failure: Secondary | ICD-10-CM | POA: Insufficient documentation

## 2018-02-11 DIAGNOSIS — G4733 Obstructive sleep apnea (adult) (pediatric): Secondary | ICD-10-CM | POA: Diagnosis present

## 2018-02-11 DIAGNOSIS — E1151 Type 2 diabetes mellitus with diabetic peripheral angiopathy without gangrene: Secondary | ICD-10-CM | POA: Diagnosis present

## 2018-02-11 DIAGNOSIS — G40909 Epilepsy, unspecified, not intractable, without status epilepticus: Secondary | ICD-10-CM | POA: Diagnosis present

## 2018-02-11 DIAGNOSIS — Z85828 Personal history of other malignant neoplasm of skin: Secondary | ICD-10-CM | POA: Diagnosis not present

## 2018-02-11 DIAGNOSIS — Z7951 Long term (current) use of inhaled steroids: Secondary | ICD-10-CM | POA: Diagnosis not present

## 2018-02-11 DIAGNOSIS — K7581 Nonalcoholic steatohepatitis (NASH): Secondary | ICD-10-CM | POA: Diagnosis present

## 2018-02-11 DIAGNOSIS — N184 Chronic kidney disease, stage 4 (severe): Secondary | ICD-10-CM | POA: Diagnosis not present

## 2018-02-11 DIAGNOSIS — F419 Anxiety disorder, unspecified: Secondary | ICD-10-CM | POA: Diagnosis present

## 2018-02-11 DIAGNOSIS — I130 Hypertensive heart and chronic kidney disease with heart failure and stage 1 through stage 4 chronic kidney disease, or unspecified chronic kidney disease: Secondary | ICD-10-CM | POA: Diagnosis present

## 2018-02-11 DIAGNOSIS — Z79899 Other long term (current) drug therapy: Secondary | ICD-10-CM | POA: Diagnosis not present

## 2018-02-11 DIAGNOSIS — Z7982 Long term (current) use of aspirin: Secondary | ICD-10-CM | POA: Diagnosis not present

## 2018-02-11 DIAGNOSIS — Z9012 Acquired absence of left breast and nipple: Secondary | ICD-10-CM | POA: Diagnosis not present

## 2018-02-11 DIAGNOSIS — I5033 Acute on chronic diastolic (congestive) heart failure: Secondary | ICD-10-CM | POA: Diagnosis present

## 2018-02-11 DIAGNOSIS — Z86011 Personal history of benign neoplasm of the brain: Secondary | ICD-10-CM | POA: Diagnosis not present

## 2018-02-11 DIAGNOSIS — E1122 Type 2 diabetes mellitus with diabetic chronic kidney disease: Secondary | ICD-10-CM | POA: Diagnosis present

## 2018-02-11 DIAGNOSIS — Z833 Family history of diabetes mellitus: Secondary | ICD-10-CM | POA: Diagnosis not present

## 2018-02-11 DIAGNOSIS — N179 Acute kidney failure, unspecified: Secondary | ICD-10-CM | POA: Diagnosis present

## 2018-02-11 DIAGNOSIS — Z808 Family history of malignant neoplasm of other organs or systems: Secondary | ICD-10-CM | POA: Diagnosis not present

## 2018-02-11 DIAGNOSIS — E1142 Type 2 diabetes mellitus with diabetic polyneuropathy: Secondary | ICD-10-CM | POA: Diagnosis present

## 2018-02-11 DIAGNOSIS — Z794 Long term (current) use of insulin: Secondary | ICD-10-CM | POA: Diagnosis not present

## 2018-02-11 DIAGNOSIS — I4892 Unspecified atrial flutter: Secondary | ICD-10-CM | POA: Diagnosis present

## 2018-02-11 DIAGNOSIS — F329 Major depressive disorder, single episode, unspecified: Secondary | ICD-10-CM | POA: Diagnosis present

## 2018-02-11 LAB — COMPREHENSIVE METABOLIC PANEL
ALT: 38 U/L (ref 0–44)
AST: 40 U/L (ref 15–41)
Albumin: 2.7 g/dL — ABNORMAL LOW (ref 3.5–5.0)
Alkaline Phosphatase: 137 U/L — ABNORMAL HIGH (ref 38–126)
Anion gap: 9 (ref 5–15)
BUN: 36 mg/dL — AB (ref 8–23)
CHLORIDE: 98 mmol/L (ref 98–111)
CO2: 27 mmol/L (ref 22–32)
CREATININE: 2.15 mg/dL — AB (ref 0.44–1.00)
Calcium: 8.6 mg/dL — ABNORMAL LOW (ref 8.9–10.3)
GFR calc Af Amer: 24 mL/min — ABNORMAL LOW (ref >60)
GFR, EST NON AFRICAN AMERICAN: 21 mL/min — AB (ref >60)
Glucose, Bld: 274 mg/dL — ABNORMAL HIGH (ref 70–99)
Potassium: 4.6 mmol/L (ref 3.5–5.1)
SODIUM: 134 mmol/L — AB (ref 135–145)
Total Bilirubin: 0.9 mg/dL (ref 0.3–1.2)
Total Protein: 6 g/dL — ABNORMAL LOW (ref 6.5–8.1)

## 2018-02-11 LAB — CBC WITH DIFFERENTIAL/PLATELET
Abs Immature Granulocytes: 0.08 10*3/uL — ABNORMAL HIGH (ref 0.00–0.07)
BASOS ABS: 0 10*3/uL (ref 0.0–0.1)
Basophils Relative: 1 %
EOS PCT: 5 %
Eosinophils Absolute: 0.3 10*3/uL (ref 0.0–0.5)
HEMATOCRIT: 33 % — AB (ref 36.0–46.0)
HEMOGLOBIN: 10.1 g/dL — AB (ref 12.0–15.0)
IMMATURE GRANULOCYTES: 1 %
LYMPHS ABS: 1.4 10*3/uL (ref 0.7–4.0)
LYMPHS PCT: 24 %
MCH: 29.1 pg (ref 26.0–34.0)
MCHC: 30.6 g/dL (ref 30.0–36.0)
MCV: 95.1 fL (ref 80.0–100.0)
MONO ABS: 0.4 10*3/uL (ref 0.1–1.0)
Monocytes Relative: 8 %
Neutro Abs: 3.6 10*3/uL (ref 1.7–7.7)
Neutrophils Relative %: 61 %
Platelets: 161 10*3/uL (ref 150–400)
RBC: 3.47 MIL/uL — ABNORMAL LOW (ref 3.87–5.11)
RDW: 14 % (ref 11.5–15.5)
WBC: 5.8 10*3/uL (ref 4.0–10.5)
nRBC: 0 % (ref 0.0–0.2)

## 2018-02-11 LAB — HEMOGLOBIN A1C
HEMOGLOBIN A1C: 6.5 % — AB (ref 4.8–5.6)
MEAN PLASMA GLUCOSE: 139.85 mg/dL

## 2018-02-11 LAB — I-STAT VENOUS BLOOD GAS, ED
Acid-Base Excess: 6 mmol/L — ABNORMAL HIGH (ref 0.0–2.0)
Bicarbonate: 30.1 mmol/L — ABNORMAL HIGH (ref 20.0–28.0)
O2 Saturation: 92 %
PCO2 VEN: 41.3 mmHg — AB (ref 44.0–60.0)
TCO2: 31 mmol/L (ref 22–32)
pH, Ven: 7.471 — ABNORMAL HIGH (ref 7.250–7.430)
pO2, Ven: 59 mmHg — ABNORMAL HIGH (ref 32.0–45.0)

## 2018-02-11 LAB — I-STAT TROPONIN, ED: Troponin i, poc: 0 ng/mL (ref 0.00–0.08)

## 2018-02-11 LAB — TSH: TSH: 1.505 u[IU]/mL (ref 0.350–4.500)

## 2018-02-11 LAB — T4, FREE: Free T4: 1.06 ng/dL (ref 0.82–1.77)

## 2018-02-11 LAB — CBG MONITORING, ED
GLUCOSE-CAPILLARY: 280 mg/dL — AB (ref 70–99)
GLUCOSE-CAPILLARY: 231 mg/dL — AB (ref 70–99)
GLUCOSE-CAPILLARY: 175 mg/dL — AB (ref 70–99)
Glucose-Capillary: 276 mg/dL — ABNORMAL HIGH (ref 70–99)

## 2018-02-11 LAB — GLUCOSE, CAPILLARY: GLUCOSE-CAPILLARY: 226 mg/dL — AB (ref 70–99)

## 2018-02-11 LAB — BRAIN NATRIURETIC PEPTIDE: B NATRIURETIC PEPTIDE 5: 296.1 pg/mL — AB (ref 0.0–100.0)

## 2018-02-11 MED ORDER — SODIUM CHLORIDE 0.9% FLUSH: 3.0000 mL | Freq: Two times a day (BID) | INTRAVENOUS | Status: DC

## 2018-02-11 MED ORDER — SODIUM CHLORIDE 0.9% FLUSH: 3.0000 mL | INTRAVENOUS | Status: DC | PRN

## 2018-02-11 MED ORDER — SODIUM CHLORIDE 0.9 % IV SOLN: 250.0000 mL | INTRAVENOUS | Status: DC | PRN

## 2018-02-11 MED ORDER — FUROSEMIDE 10 MG/ML IJ SOLN: 40.0000 mg | Freq: Two times a day (BID) | INTRAMUSCULAR | Status: DC

## 2018-02-11 MED ORDER — FUROSEMIDE 10 MG/ML IJ SOLN
40.0000 mg | Freq: Once | INTRAMUSCULAR | Status: AC
  Administered 2018-02-11: 40 mg via INTRAVENOUS
  Filled 2018-02-11: qty 4

## 2018-02-11 MED ORDER — POLYETHYLENE GLYCOL 3350 17 G PO PACK: 17.0000 g | PACK | Freq: Every day | ORAL | Status: DC | PRN

## 2018-02-11 MED ORDER — ENOXAPARIN SODIUM 30 MG/0.3ML ~~LOC~~ SOLN
30.0000 mg | Freq: Every day | SUBCUTANEOUS | Status: DC
  Administered 2018-02-11 – 2018-02-15 (×5): 30 mg via SUBCUTANEOUS
  Filled 2018-02-11 (×5): qty 0.3

## 2018-02-11 NOTE — ED Notes (Signed)
Family visiting, wanded by security Dr Ralene Bathe informed of elevated BP no new orders at this time

## 2018-02-11 NOTE — ED Notes (Signed)
Carb Modified Diet was ordered for Lunch. 

## 2018-02-11 NOTE — ED Notes (Signed)
This RN and phlebotomy attempted for blood draws

## 2018-02-11 NOTE — ED Notes (Addendum)
Attempted report x1, RN unavailable  

## 2018-02-11 NOTE — ED Notes (Signed)
ED TO INPATIENT HANDOFF REPORT  Name/Age/Gender Morgan [name] 77 y.o. female  Code Status    Code Status Orders  (From admission, onward)         Start     Ordered   02/11/18 2005  Do not attempt resuscitation (DNR)  Continuous    Question Answer Comment  In the event of cardiac or respiratory ARREST Do not call a "code blue"   In the event of cardiac or respiratory ARREST Do not perform Intubation, CPR, defibrillation or ACLS   In the event of cardiac or respiratory ARREST Use medication by any route, position, wound care, and other measures to relive pain and suffering. May use oxygen, suction and manual treatment of airway obstruction as needed for comfort.      02/11/18 2005        Code Status History    Date Active Date Inactive Code Status Order ID Comments User Context   02/10/2018 0123 02/11/2018 2005 Full Code [id number]  Delora Fuel, MD ED   02/09/2018 2340 02/10/2018 0123 Full Code [id number]  Virgel Manifold, MD ED   05/22/2016 2127 05/26/2016 2034 DNR [id number]  Samella Parr, NP Inpatient   05/22/2016 1734 05/22/2016 2127 DNR [id number]  Samella Parr, NP ED   08/11/2015 1945 08/15/2015 1525 Full Code [id number]  Etta Quill, DO ED   01/15/2015 1645 01/18/2015 1951 Partial Code [id number]  Dixie Dials, MD ED   05/29/2014 1809 06/11/2014 2030 Full Code [id number]  Cathlyn Parsons, PA-C Inpatient   05/29/2014 1809 05/29/2014 1809 Full Code [id number]  Cathlyn Parsons, PA-C Inpatient   05/08/2014 1731 05/29/2014 1809 Full Code [id number]  Ashok Pall, MD Inpatient   05/03/2014 2253 05/08/2014 1731 Full Code [id number]  Etta Quill, DO ED      Home/SNF/Other Home  Chief Complaint High BP  Level of Care/Admitting Diagnosis ED Disposition    ED Disposition Condition [facility] Area: [place] [100100]  Level of Care: Telemetry [5]  Diagnosis: Acute on chronic heart failure [facility]) [[id number]]  Admitting Physician: Axel Filler [id number]  Attending Physician: Axel Filler [[id number]]  Estimated length of stay: past midnight tomorrow  Certification:: I certify this patient will need inpatient services for at least 2 midnights  PT Class (Do Not Modify): Inpatient [101]  PT Acc Code (Do Not Modify): Private [1]       Medical History Past Medical History:  Diagnosis Date  . Abnormal liver function tests   . Acquired autoimmune hypothyroidism   . Basal cell carcinoma    Chest  . Cancer (HCC)    Breast  . Combined hyperlipidemia   . COPD with acute exacerbation ([place])   . Depression   . Diabetes mellitus   . Diabetes type 2, uncontrolled ([place])   . 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 Fracture    Left wrist  . Goiter   . Gout   . History of gastroesophageal reflux (GERD)   . Hypertension   . Hypoglycemia associated with diabetes ([place])   . Meningioma ([place])   . Obesity   . Renal insufficiency   . Seizure ([place])   . Shoulder fracture, right 07/2015  . Sleep apnea, obstructive   . Thyroiditis, autoimmune   . Type II diabetes mellitus with peripheral angiopathy ([place])   . Vertigo     Allergies Allergies  Allergen Reactions  . Latex Rash  . Wellbutrin [Bupropion] Other (  See Comments)    Unable to stand.   . Keppra [Levetiracetam] Other (See Comments)    Dementia.  . Statins   . Byetta 10 Mcg Pen [Exenatide] Rash  . Metformin Rash    RENAL INSUFFICIENCY-takes low dose  . Penicillins Rash  . Sulfa Antibiotics Other (See Comments)    Unknown allergic reaction    IV Location/Drains/Wounds Patient Lines/Drains/Airways Status   Active Line/Drains/Airways    Name:   Placement date:   Placement time:   Site:   Days:   Peripheral IV 02/11/18 Right;Anterior Forearm   02/11/18    1612    Forearm   less than 1          Labs/Imaging Results for orders placed or performed during the hospital encounter of 02/09/18 (from the past 48 hour(s))  CBG monitoring, ED      Status: Abnormal   Collection Time: 02/09/18 10:04 PM  Result Value Ref Range   Glucose-Capillary 149 (H) 70 - 99 mg/dL  CBC with Differential     Status: Abnormal   Collection Time: 02/09/18 11:25 PM  Result Value Ref Range   WBC 8.2 4.0 - 10.5 K/uL   RBC 3.47 (L) 3.87 - 5.11 MIL/uL   Hemoglobin 10.0 (L) 12.0 - 15.0 g/dL   HCT 33.6 (L) 36.0 - 46.0 %   MCV 96.8 80.0 - 100.0 fL   MCH 28.8 26.0 - 34.0 pg   MCHC 29.8 (L) 30.0 - 36.0 g/dL   RDW 14.3 11.5 - 15.5 %   Platelets 194 150 - 400 K/uL   nRBC 0.0 0.0 - 0.2 %   Neutrophils Relative % 60 %   Neutro Abs 4.9 1.7 - 7.7 K/uL   Lymphocytes Relative 26 %   Lymphs Abs 2.2 0.7 - 4.0 K/uL   Monocytes Relative 8 %   Monocytes Absolute 0.7 0.1 - 1.0 K/uL   Eosinophils Relative 4 %   Eosinophils Absolute 0.3 0.0 - 0.5 K/uL   Basophils Relative 1 %   Basophils Absolute 0.1 0.0 - 0.1 K/uL   Immature Granulocytes 1 %   Abs Immature Granulocytes 0.08 (H) 0.00 - 0.07 K/uL    Comment: Performed at [facility], [address], [place], [place] [address]  Basic metabolic panel     Status: Abnormal   Collection Time: 02/09/18 11:25 PM  Result Value Ref Range   Sodium 136 135 - 145 mmol/L   Potassium 4.8 3.5 - 5.1 mmol/L   Chloride 100 98 - 111 mmol/L   CO2 25 22 - 32 mmol/L   Glucose, Bld 173 (H) 70 - 99 mg/dL   BUN 29 (H) 8 - 23 mg/dL   Creatinine, Ser 1.79 (H) 0.44 - 1.00 mg/dL   Calcium 8.6 (L) 8.9 - 10.3 mg/dL   GFR calc non Af Amer 26 (L) >60 mL/min   GFR calc Af Amer 30 (L) >60 mL/min    Comment: (NOTE) The eGFR has been calculated using the CKD EPI equation. This calculation has not been validated in all clinical situations. eGFR's persistently <60 mL/min signify possible Chronic Kidney Disease.    Anion gap 11 5 - 15    Comment: Performed at [facility] [address], [place], [place] [address]  CBG monitoring, ED     Status: Abnormal   Collection Time: 02/10/18  1:33 AM  Result Value Ref Range    Glucose-Capillary 165 (H) 70 - 99 mg/dL   Comment 1 Notify RN  CBG monitoring, ED     Status: Abnormal   Collection Time: 02/10/18  6:56 AM  Result Value Ref Range   Glucose-Capillary 219 (H) 70 - 99 mg/dL  CBG monitoring, ED     Status: Abnormal   Collection Time: 02/10/18  8:32 AM  Result Value Ref Range   Glucose-Capillary 216 (H) 70 - 99 mg/dL  Urinalysis, Routine w reflex microscopic     Status: Abnormal   Collection Time: 02/10/18  3:50 PM  Result Value Ref Range   Color, Urine YELLOW YELLOW   APPearance CLEAR CLEAR   Specific Gravity, Urine 1.014 1.005 - 1.030   pH 7.0 5.0 - 8.0   Glucose, UA 50 (A) NEGATIVE mg/dL   Hgb urine dipstick NEGATIVE NEGATIVE   Bilirubin Urine NEGATIVE NEGATIVE   Ketones, ur NEGATIVE NEGATIVE mg/dL   Protein, ur >=300 (A) NEGATIVE mg/dL   Nitrite NEGATIVE NEGATIVE   Leukocytes, UA NEGATIVE NEGATIVE   RBC / HPF 0-5 0 - 5 RBC/hpf   WBC, UA 0-5 0 - 5 WBC/hpf   Bacteria, UA RARE (A) NONE SEEN   Hyaline Casts, UA PRESENT    Non Squamous Epithelial 0-5 (A) NONE SEEN    Comment: Performed at [facility], [address] [place], [place] [address]  Rapid urine drug screen (hospital performed)     Status: Abnormal   Collection Time: 02/10/18  3:50 PM  Result Value Ref Range   Opiates POSITIVE (A) NONE DETECTED   Cocaine NONE DETECTED NONE DETECTED   Benzodiazepines NONE DETECTED NONE DETECTED   Amphetamines NONE DETECTED NONE DETECTED   Tetrahydrocannabinol NONE DETECTED NONE DETECTED   Barbiturates NONE DETECTED NONE DETECTED    Comment: (NOTE) DRUG SCREEN FOR MEDICAL PURPOSES ONLY.  IF CONFIRMATION IS NEEDED FOR ANY PURPOSE, NOTIFY LAB WITHIN 5 DAYS. LOWEST DETECTABLE LIMITS FOR URINE DRUG SCREEN Drug Class                     Cutoff (ng/mL) Amphetamine and metabolites    1000 Barbiturate and metabolites    200 Benzodiazepine                 967 Tricyclics and metabolites     300 Opiates and metabolites        300 Cocaine and  metabolites        300 THC                            50 Performed at [facility], [place] [address], [place], [place] [address]   CBG monitoring, ED     Status: Abnormal   Collection Time: 02/10/18  5:43 PM  Result Value Ref Range   Glucose-Capillary 237 (H) 70 - 99 mg/dL  CBG monitoring, ED     Status: Abnormal   Collection Time: 02/10/18  9:35 PM  Result Value Ref Range   Glucose-Capillary 250 (H) 70 - 99 mg/dL  CBG monitoring, ED     Status: Abnormal   Collection Time: 02/11/18  7:54 AM  Result Value Ref Range   Glucose-Capillary 276 (H) 70 - 99 mg/dL  CBG monitoring, ED     Status: Abnormal   Collection Time: 02/11/18 12:52 PM  Result Value Ref Range   Glucose-Capillary 280 (H) 70 - 99 mg/dL  CBG monitoring, ED     Status: Abnormal   Collection Time: 02/11/18  5:08 PM  Result Value Ref  Range   Glucose-Capillary 231 (H) 70 - 99 mg/dL   Comment 1 Document in Chart   Comprehensive metabolic panel     Status: Abnormal   Collection Time: 02/11/18  5:11 PM  Result Value Ref Range   Sodium 134 (L) 135 - 145 mmol/L   Potassium 4.6 3.5 - 5.1 mmol/L   Chloride 98 98 - 111 mmol/L   CO2 27 22 - 32 mmol/L   Glucose, Bld 274 (H) 70 - 99 mg/dL   BUN 36 (H) 8 - 23 mg/dL   Creatinine, Ser 2.15 (H) 0.44 - 1.00 mg/dL   Calcium 8.6 (L) 8.9 - 10.3 mg/dL   Total Protein 6.0 (L) 6.5 - 8.1 g/dL   Albumin 2.7 (L) 3.5 - 5.0 g/dL   AST 40 15 - 41 U/L   ALT 38 0 - 44 U/L   Alkaline Phosphatase 137 (H) 38 - 126 U/L   Total Bilirubin 0.9 0.3 - 1.2 mg/dL   GFR calc non Af Amer 21 (L) >60 mL/min   GFR calc Af Amer 24 (L) >60 mL/min    Comment: (NOTE) The eGFR has been calculated using the CKD EPI equation. This calculation has not been validated in all clinical situations. eGFR's persistently <60 mL/min signify possible Chronic Kidney Disease.    Anion gap 9 5 - 15    Comment: Performed at [facility] [address], [place], [place] [address]  CBC with Differential     Status:  Abnormal   Collection Time: 02/11/18  5:11 PM  Result Value Ref Range   WBC 5.8 4.0 - 10.5 K/uL   RBC 3.47 (L) 3.87 - 5.11 MIL/uL   Hemoglobin 10.1 (L) 12.0 - 15.0 g/dL   HCT 33.0 (L) 36.0 - 46.0 %   MCV 95.1 80.0 - 100.0 fL   MCH 29.1 26.0 - 34.0 pg   MCHC 30.6 30.0 - 36.0 g/dL   RDW 14.0 11.5 - 15.5 %   Platelets 161 150 - 400 K/uL   nRBC 0.0 0.0 - 0.2 %   Neutrophils Relative % 61 %   Neutro Abs 3.6 1.7 - 7.7 K/uL   Lymphocytes Relative 24 %   Lymphs Abs 1.4 0.7 - 4.0 K/uL   Monocytes Relative 8 %   Monocytes Absolute 0.4 0.1 - 1.0 K/uL   Eosinophils Relative 5 %   Eosinophils Absolute 0.3 0.0 - 0.5 K/uL   Basophils Relative 1 %   Basophils Absolute 0.0 0.0 - 0.1 K/uL   Immature Granulocytes 1 %   Abs Immature Granulocytes 0.08 (H) 0.00 - 0.07 K/uL    Comment: Performed at [facility] [address] [place], [place] [address]  I-stat troponin, ED     Status: None   Collection Time: 02/11/18  5:21 PM  Result Value Ref Range   Troponin i, poc 0.00 0.00 - 0.08 ng/mL   Comment 3            Comment: Due to the release kinetics of cTnI, a negative result within the first hours of the onset of symptoms does not rule out myocardial infarction with certainty. If myocardial infarction is still suspected, repeat the test at appropriate intervals.   Brain natriuretic peptide     Status: Abnormal   Collection Time: 02/11/18  5:25 PM  Result Value Ref Range   B Natriuretic Peptide 296.1 (H) 0.0 - 100.0 pg/mL    Comment: Performed at [facility]  [address], [place], [place] [address]  I-Stat venous blood gas, ED     Status: Abnormal   Collection Time: 02/11/18  6:02 PM  Result Value Ref Range   pH, Ven 7.471 (H) 7.250 - 7.430   pCO2, Ven 41.3 (L) 44.0 - 60.0 mmHg   pO2, Ven 59.0 (H) 32.0 - 45.0 mmHg   Bicarbonate 30.1 (H) 20.0 - 28.0 mmol/L   TCO2 31 22 - 32 mmol/L   O2 Saturation 92.0 %   Acid-Base Excess 6.0 (H) 0.0 - 2.0 mmol/L   Patient temperature  HIDE    Sample type VENOUS   CBG monitoring, ED     Status: Abnormal   Collection Time: 02/11/18  7:11 PM  Result Value Ref Range   Glucose-Capillary 175 (H) 70 - 99 mg/dL  Hemoglobin A1c     Status: Abnormal   Collection Time: 02/11/18  8:25 PM  Result Value Ref Range   Hgb A1c MFr Bld 6.5 (H) 4.8 - 5.6 %    Comment: (NOTE) Pre diabetes:          5.7%-6.4% Diabetes:              >6.4% Glycemic control for   <7.0% adults with diabetes    Mean Plasma Glucose 139.85 mg/dL    Comment: Performed at [facility], [address] [place], [place] [address]   Dg Chest 2 View  Result Date: 02/11/2018 CLINICAL DATA:  Shortness of breath last week without cough or fever. Bilateral and upper lower extremity swelling for the past 2 days. History of hypertension and diabetes. Nonsmoker. EXAM: CHEST - 2 VIEW COMPARISON:  Chest x-ray of January 28, 2018 FINDINGS: The lungs are adequately inflated and clear. The heart is enlarged. The pulmonary vascularity is mildly prominent. The interstitial markings while slightly increased are less prominent than on the previous study. A small amount of pleural fluid is suspected layering posteriorly. The bony thorax exhibits no acute abnormality. IMPRESSION: Low-grade CHF. Spine possible small amount of pleural fluid layering posteriorly. No alveolar pneumonia. Electronically Signed   By: David  Martinique M.D.   On: 02/11/2018 15:33   Ct Head Wo Contrast  Result Date: 02/10/2018 CLINICAL DATA:  Headache. History of meningioma resection 2016, breast cancer, hypertension. EXAM: CT HEAD WITHOUT CONTRAST TECHNIQUE: Contiguous axial images were obtained from the base of the skull through the vertex without intravenous contrast. COMPARISON:  MRI head August 07, 2017 and CT HEAD May 20, 2016. FINDINGS: Mild motion degraded examination. BRAIN: 12 mm lentiform RIGHT frontal extra-axial collection was 10 mm. Linear probable membrane formation with heterogeneous dense  component. RIGHT frontal encephalomalacia subjacent to collection with ex vacuo dilatation RIGHT frontal horn of the lateral ventricle. No hydrocephalus. No midline shift or mass effect. No acute large vascular territory infarcts. No intraparenchymal hemorrhage. Basal cisterns are patent. VASCULAR: Mild calcific atherosclerosis of the carotid siphons. SKULL: No skull fracture. Old RIGHT frontal craniotomy. No scalp fluid collections. Tooth 9 periapical abscess versus periosteal reabsorption about implant. No significant scalp soft tissue swelling. Small RIGHT parietal tricholemmal cyst. SINUSES/ORBITS: Trace paranasal sinus mucosal thickening. Mastoid air cells are well aerated.The included ocular globes and orbital contents are non-suspicious. OTHER: None. IMPRESSION: 1. 12 mm RIGHT frontal extra-axial collection was 10 mm with interval small volume hemorrhage, potential component of mineralization. No significant mass effect. 2. Status post RIGHT frontal craniotomy for meningioma resection with similar RIGHT frontal encephalomalacia. 3. Acute findings discussed with and reconfirmed by Dr.STEPHEN KOHUT on 02/09/2018 at  12:14 am. Electronically Signed   By: Elon Alas M.D.   On: 02/10/2018 00:15    Pending Labs Unresulted Labs (From admission, onward)    Start     Ordered   02/18/18 0500  Creatinine, serum  (enoxaparin (LOVENOX)    CrCl < 30 ml/min)  Weekly,   R    Comments:  while on enoxaparin therapy.    02/11/18 2005   02/12/18 8088  Basic metabolic panel  Daily,   R     02/11/18 2005   02/12/18 0500  CBC  Tomorrow morning,   R     02/11/18 2005   02/11/18 2030  T4, free  Once,   R     02/11/18 2029   02/11/18 2030  T3, free  Once,   R     02/11/18 2029   02/11/18 1958  TSH  Once,   R     02/11/18 2005          Vitals/Pain Today's Vitals   02/11/18 1830 02/11/18 1839 02/11/18 1900 02/11/18 2107  BP: (!) 169/50  (!) 184/82   Pulse: (!) 59 64 60 64  Resp: (!) 28 (!) 25 15    Temp:      TempSrc:      SpO2: 94% 97% 97%   PainSc:        Isolation Precautions No active isolations  Medications Medications  alum & mag hydroxide-simeth (MAALOX/MYLANTA) 200-200-20 MG/5ML suspension 30 mL (has no administration in time range)  albuterol (PROVENTIL) (2.5 MG/3ML) 0.083% nebulizer solution 2.5 mg (has no administration in time range)  amiodarone (PACERONE) tablet 100 mg (100 mg Oral Given 02/11/18 1007)  aspirin EC tablet 81 mg (81 mg Oral Given 02/11/18 1008)  mometasone-formoterol (DULERA) 100-5 MCG/ACT inhaler 2 puff (2 puffs Inhalation Given 02/11/18 0806)  cloNIDine (CATAPRES - Dosed in mg/24 hr) patch 0.3 mg (has no administration in time range)  lamoTRIgine (LAMICTAL) tablet 100 mg (100 mg Oral Given 02/11/18 1008)  levothyroxine (SYNTHROID, LEVOTHROID) tablet 200 mcg (0 mcg Oral Hold 02/11/18 0514)  liraglutide (VICTOZA) SOPN 1.8 mg (1.8 mg Subcutaneous Given 02/11/18 1107)  lubiprostone (AMITIZA) capsule 8 mcg (8 mcg Oral Given 02/11/18 1723)  metoprolol tartrate (LOPRESSOR) tablet 50 mg (50 mg Oral Given 02/11/18 2107)  pantoprazole (PROTONIX) EC tablet 40 mg (40 mg Oral Given 02/11/18 1008)  senna (SENOKOT) tablet 17.2 mg (17.2 mg Oral Given 02/10/18 2137)  traMADol (ULTRAM) tablet 50 mg (50 mg Oral Given 02/11/18 1507)  ondansetron (ZOFRAN) tablet 4 mg (4 mg Oral Given 02/10/18 2137)  acetaminophen (TYLENOL) tablet 650 mg (650 mg Oral Given 02/10/18 2137)  ALPRAZolam (XANAX) tablet 0.5 mg (0.5 mg Oral Given 02/10/18 2137)  ARIPiprazole (ABILIFY) tablet 5 mg (5 mg Oral Given 02/11/18 1723)  calcium-vitamin D (OSCAL WITH D) 500-200 MG-UNIT per tablet 1 tablet (1 tablet Oral Given 02/11/18 1006)  DULoxetine (CYMBALTA) DR capsule 60 mg (60 mg Oral Given 02/11/18 1008)  multivitamin with minerals tablet 1 tablet (1 tablet Oral Given 02/11/18 1007)  vitamin B-12 (CYANOCOBALAMIN) tablet 1,000 mcg (1,000 mcg Oral Given 02/11/18 1007)  insulin aspart (novoLOG) injection 0-15  Units (5 Units Subcutaneous Given 02/11/18 1722)  enoxaparin (LOVENOX) injection 30 mg (has no administration in time range)  polyethylene glycol (MIRALAX / GLYCOLAX) packet 17 g (has no administration in time range)  furosemide (LASIX) injection 40 mg (has no administration in time range)  LORazepam (ATIVAN) tablet 1 mg (1 mg Oral Given 02/09/18 2323)  HYDROcodone-acetaminophen (NORCO/VICODIN) 5-325 MG per tablet 1 tablet (1 tablet Oral Given 02/09/18 2323)  furosemide (LASIX) injection 40 mg (40 mg Intravenous Given 02/11/18 2108)    Mobility walks with device

## 2018-02-11 NOTE — ED Provider Notes (Signed)
Called to bedside for increased swelling. Patient initially presented to the emergency department for elevated blood pressure. She was endorsing suicidal ideation and she was evaluated for psych. She is been cleared from a psychiatric standpoint in terms of her suicidal thoughts and she denies he is on my current assessment. Family state that she has increase edema since she is presented to the department and she reports increased shortness of breath. On examination she has mild tachypnea with crackles and bilateral bases. She has significant edema over bilateral upper and lower extremities 1 to 2+ pitting. Blood pressure has improved on her home medications with blood pressure is 063 systolic. Chest x-ray demonstrates pulmonary vascular congestion and labs demonstrate increasing creatinine. Plan to admit for volume overload and CHF. Medicine consulted for admission.   Quintella Reichert, MD 02/11/18 [phone]

## 2018-02-11 NOTE — ED Notes (Signed)
Patient transported to X-ray 

## 2018-02-11 NOTE — H&P (Addendum)
Date: 02/12/2018               Patient Name:  Morgan [name] MRN: [id number]  DOB: Sep 19, 1940 Age / Sex: 77 y.o., female   PCP: Morgan [name]., MD         Medical Service: Internal Medicine Teaching Service         Attending Physician: Dr. Evette Doffing     First Contact: Dr. Sharon Seller  Pager: [phone]  Second Contact: Dr. Frederico Hamman Pager: [phone]       After Hours (After 5p/  First Contact Pager: [phone]  weekends / holidays): Second Contact Pager: [phone]   Chief Complaint: Shortness of breath, Suicidal Ideation   History of Present Illness:   [name] is a 77 year old woman with chronic diastolic heart failure EF 65-70%, atrial flutter, arthrosclerotic coronary artery disease, CKD III/IV, pulmonary hypertension, obstructive sleep apnea, type 2 diabetes mellitus, hypothyroidism, nonalcoholic steatohepatitis, seizure disorder, emphysema, major depressive disorder, history of meningioma status post right frontal craniotomy, history of breast cancer status post left mastectomy and abnormal gait presenting for evaluation of shortness of breath and anasarca.  Patient reports of worsening shortness of breath with onset 3 days ago as well as anasarca which seem to have worsened the morning of admission.  She denies chest pain, palpitation, diaphoresis, nausea, vomiting, dizziness, lightheadedness or vision abnormalities.  She also declines orthopnea or paroxysmal nocturnal dyspnea though she sleeps on a recliner and has been doing so since 2016.  Patient daughter at bedside also reports elevated blood pressure measurements with range 190s to 200s over the past couple days.  Patient's weight 2 days ago was 246 pounds (112 kg) though her dry weight is ~225 pounds (102.3kg).  Per chart review, patient's Lasix was recently increased from 20 mg daily to 40 mg daily due to weight gain.  Patient does live by herself as her husband passed away 2 weeks ago but she has a caregiver who stays with her 3  days a week and a CNA who checks in on her 3-4 days a week.  Her daughter organizes her medications.  Since 2 weeks ago, patient has been depressed and has had suicidal ideation with plan to overdose on her medications.  Her daughter reports that she has been evaluated via telemetry psych in [facility].  At the interim, she has been complaining of worsening right-sided headache that does not interfere with her daily activities.  Given her history of meningioma, she had a repeat MRI which showed increase fluid at the area of the surgical site and neurosurgery had planned for patient to have a repeat MRI and discuss options going forward.  ED course: Afebrile, BP 179/71, RR 19, pulse 68, O2 saturation of 95% on ambient air, CMP shows mild hyponatremia, elevated creatinine, CBC shows stable hemoglobin, i-STAT troponin negative, BNP of 296, VBG with no evidence of respiratory acidosis, chest x-ray shows small pleural effusion, low-grade CHF, CT head shows 12 mm right frontal extra-axial collection with interval small volume hemorrhage but however no significant mass-effect, EKG shows no significant ST or T wave abnormalities though T wave inversion noted in lead III.  Meds:  Current Meds  Medication Sig  . acetaminophen (TYLENOL) 325 MG tablet Take 650 mg by mouth every 6 (six) hours as needed for mild pain.   Marland Kitchen albuterol (PROVENTIL) (2.5 MG/3ML) 0.083% nebulizer solution Take 3 mLs (2.5 mg total) by nebulization every 2 (two) hours as needed for shortness of breath.  Marland Kitchen  ALPRAZolam (XANAX) 0.5 MG tablet Take 0.5 mg by mouth every 8 (eight) hours as needed for anxiety.  Marland Kitchen amiodarone (PACERONE) 200 MG tablet TAKE 1 TABLET (200 MG TOTAL) BY MOUTH DAILY. (Patient taking differently: Take 100 mg by mouth daily. TAKE 1 TABLET (200 MG TOTAL) BY MOUTH DAILY.)  . ARIPiprazole (ABILIFY) 5 MG tablet Take 5 mg by mouth every evening.  Marland Kitchen aspirin EC 81 MG tablet Take 1 tablet (81 mg total) by mouth daily.  .  budesonide-formoterol (SYMBICORT) 80-4.5 MCG/ACT inhaler Inhale 2 puffs into the lungs 2 (two) times daily.  . Calcium Carb-Cholecalciferol (CALCIUM 600+D) 600-800 MG-UNIT TABS Take 1 tablet by mouth daily.  . cloNIDine (CATAPRES - DOSED IN MG/24 HR) 0.3 mg/24hr patch Place 0.3 mg onto the skin every Friday.   . colchicine 0.6 MG tablet Take 0.6 mg by mouth daily as needed (for gout).   . DULoxetine (CYMBALTA) 60 MG capsule Take 60 mg by mouth 2 (two) times daily.   . furosemide (LASIX) 40 MG tablet Take 40 mg by mouth daily.   Marland Kitchen HUMULIN R 500 UNIT/ML injection Inject 5-10 Units into the skin 3 (three) times daily with meals. Sliding scale 150 and below no Insulin  Breakfast  151-200 = 9 units 201-250= 10 units 251-300= 11 units 301-350=12 units 351-400= 13 units 4001-455= 14 units Above 450 = 15 units  Lunch  151-200= 8 units 201-250= 9 units 251-300= 10 units 301-350= 11 units 351-400= 12 units 401-450= 13 units Above 450 = 14 units  Supper 151-200= 4 units 201-250= 5 units 251-300= 6 units 301-350=7 units 351-400= 8 units 401-450= 9 units Above 450 = 10 units  NO INSULIN AFTER SUPPER, EVEN IF IT'S HIGH BECAUSE HER LEVELS WILL DROP DURING THE NIGHT. PLEASE GIVE A SNACK  . lamoTRIgine (LAMICTAL) 100 MG tablet Take 100 mg by mouth 2 (two) times daily.  Marland Kitchen levothyroxine (SYNTHROID, LEVOTHROID) 200 MCG tablet Take 1 tablet (200 mcg total) by mouth daily before breakfast. (Patient taking differently: Take 200 mcg by mouth See admin instructions. Take 2 tablets on Sunday then take 1 tablet on all other days)  . liraglutide (VICTOZA) 18 MG/3ML SOPN Inject 1.8 mg into the skin daily. Must take at with First dose if Inulin Must eat after  . lubiprostone (AMITIZA) 8 MCG capsule Take 8 mcg by mouth 2 (two) times daily with a meal.  . metoprolol tartrate (LOPRESSOR) 50 MG tablet Take 50 mg by mouth 2 (two) times daily.  . Multiple Vitamin (MULTIVITAMIN WITH MINERALS) TABS tablet Take  1 tablet by mouth daily.  . nitroGLYCERIN (NITROSTAT) 0.4 MG SL tablet Place 0.4 mg under the tongue every 5 (five) minutes as needed for chest pain.  Marland Kitchen nystatin (MYCOSTATIN/NYSTOP) powder Apply 1 g topically daily as needed (irritation).  . pantoprazole (PROTONIX) 40 MG tablet Take 40 mg by mouth 2 (two) times daily.   Marland Kitchen senna (SENOKOT) 8.6 MG TABS tablet Take 2 tablets by mouth at bedtime.   . vitamin B-12 (CYANOCOBALAMIN) 1000 MCG tablet Take 1,000 mcg by mouth daily.     Allergies: Allergies as of 02/09/2018 - Review Complete 02/09/2018  Allergen Reaction Noted  . Latex Rash 08/29/2010  . Wellbutrin [bupropion] Other (See Comments) 01/28/2018  . Keppra [levetiracetam] Other (See Comments) 01/28/2018  . Statins  01/28/2018  . Byetta 10 mcg pen [exenatide] Rash 07/27/2015  . Metformin Rash 07/27/2015  . Penicillins Rash 08/01/2010  . Sulfa antibiotics Other (See Comments) 05/03/2014   Past Medical  History:  Diagnosis Date  . Abnormal liver function tests   . Acquired autoimmune hypothyroidism   . Basal cell carcinoma    Chest  . Cancer (HCC)    Breast  . Combined hyperlipidemia   . COPD with acute exacerbation ([place])   . Depression   . Diabetes mellitus   . Diabetes type 2, uncontrolled ([place])   . 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 Fracture    Left wrist  . Goiter   . Gout   . History of gastroesophageal reflux (GERD)   . Hypertension   . Hypoglycemia associated with diabetes ([place])   . Meningioma ([place])   . Obesity   . Renal insufficiency   . Seizure ([place])   . Shoulder fracture, right 07/2015  . Sleep apnea, obstructive   . Thyroiditis, autoimmune   . Type II diabetes mellitus with peripheral angiopathy ([place])   . Vertigo     Family History: Father with coronary artery disease and diabetes.  Sister and brother with diabetes mellitus  Social History: Denies cigarette, EtOH or illicit drug use  Review of Systems: A complete ROS was negative  except as per HPI.   Physical Exam: Blood pressure (!) 184/67, pulse 65, temperature (!) 97.5 F (36.4 C), temperature source Oral, resp. rate 20, height 5' 4"  (1.626 m), weight 106.4 kg, SpO2 92 %.  Physical Exam  Constitutional: She is oriented to person, place, and time and well-developed, well-nourished, and in no distress. No distress.  HENT:  Head: Normocephalic and atraumatic.  Eyes: Pupils are equal, round, and reactive to light. Conjunctivae and EOM are normal. Right eye exhibits no discharge. Left eye exhibits no discharge. No scleral icterus.  Neck: Neck supple.  Cardiovascular: Normal rate, regular rhythm, normal heart sounds and intact distal pulses. Exam reveals no gallop and no friction rub.  No murmur heard. Pulmonary/Chest: Effort normal. No respiratory distress. She has no wheezes. She has rales (Bibasilar rales up to 2/3 thorax).  Abdominal: Soft. Bowel sounds are normal. She exhibits no distension. There is no tenderness.  Musculoskeletal: Normal range of motion. She exhibits edema.  -2+ pitting edema at the right upper extremity -1+ pitting edema at the left upper extremity -2+ pitting edema bilaterally in the lower extremities -Noticeable facial swelling  Neurological: She is alert and oriented to person, place, and time.  Skin: Skin is warm. She is not diaphoretic.  Psychiatric: Memory normal. She exhibits a depressed mood. She expresses suicidal ideation. She expresses suicidal plans. She has a flat affect.    EKG: personally reviewed my interpretation is no ST or T wave abnormalities though T wave inversions in lead III  CXR: personally reviewed my interpretation is mild CHF, small pleural effusion  Assessment & Plan by Problem: Active Problems:   Depression   Acute on chronic heart failure [facility])  Ms. Lorino is a 77 year old woman with chronic diastolic heart failure EF 65-70%, atrial flutter, arthrosclerotic coronary artery disease, CKD III/IV, pulmonary  hypertension, obstructive sleep apnea, type 2 diabetes mellitus, hypothyroidism, nonalcoholic steatohepatitis, seizure disorder, major depressive disorder, history of meningioma status post right frontal craniotomy, history of breast cancer status post left mastectomy and abnormal gait here for management of diastolic heart failure exacerbation and active suicidal ideation.  Acute on chronic diastolic heart failure exacerbation: Presented with worsening shortness of breath, anasarca and weight gain.  Her weight 2 days ago was 246 pounds (112 kg) though dry weight is ~225 pounds (102.3kg).  Echocardiogram 2018 showed  grade 1 diastolic dysfunction with EF 65% to 70%, tricuspid regurgitation.  Physical exam noticeable for bibasilar Rales up to two thirds of the thorax, diffuse edema noticeable facial swelling.  Labs shows BNP of 296, chest x-ray reveals CHF with small pleural effusion.  Etiology of her heart failure exacerbation is unclear at this point.  Most likely not secondary to myocardial infarction, atrial flutter with RVR, or viral infection. -S/p 64m po laxis x2 -Continue IV Lasix 40 mg twice daily -Cardiac monitoring -Strict I's and O's -Daily weight -Repeat echocardiogram  Acute on chronic major depressive disorder with active suicidal ideation Grief Anxiety Patient with ongoing suicidal ideation with plan to overdose medication given the recent death of her husband 2 weeks ago.  Patient was evaluated by behavioral health and per family request [facility]  -Xanax 0.5 mg every 8 hours as needed anxiety -Abilify 5 mg nightly -Cymbalta 60 mg twice daily  Acute on chronic kidney disease: CMP shows creatinine of 2.15 (baseline Cr ~1.4-1.6).  Patient does have history of CKD III, early stage IV.  -Hold ACE inhibitor and ARB -Follow-up a.m. BMP -Continue to monitor  History of atrial flutter: Currently in sinus rhythm.  Takes  amiodarone 100 mg daily  History of meningioma status post right frontal craniotomy: Complains of right-sided headache without nausea, vomiting or focal neurological deficits. Repeat MRI by PCP showed increase fluid at the area of the surgical site and neurosurgery had planned for patient to have a repeat MRI and discuss options going forward.  CT head showed no evidence of mass effect.  However did show 12 mm right frontal extra-axial collection with small volume hemorrhage.  Neurosurgery was consulted and thought this to be chronic and did not recommend any surgical intervention - Continue tramadol 50 mg BID as needed headache  Hypertension: Continue clonidine, metoprolol tartrate 50 mg twice daily - Continue to monitor  Type 2 diabetes mellitus: Continue Victoza 1.8 mg subQ daily, moderate sliding scale insulin -Daily CBG  Hypothyroidism-uncontrolled: Continue Synthroid 200 mcg daily.  TSH in April 2019 markedly elevated (132<<20<<10).  This can be explained with potential drug drug interaction with amiodarone -Repeat TSH  Seizure disorder: Continue lamotrigine  GERD: Continue Protonix 40 mg twice daily  Atherosclerotic coronary artery disease: Continue aspirin 81 mg daily  Emphysema: Continue albuterol nebulizer every 2 as needed shortness of breath, Dulera  Obstructive sleep apnea: CPAP as needed  FEN: Replace electrolytes as needed, heart healthy diet, fluid restriction 1.2 L DVT prophylaxis: SubQ Lovenox CODE STATUS: DNR  Dispo: Admit patient to Inpatient with expected length of stay greater than 2 midnights.  Signed: AJean Rosenthal MD 02/12/2018, 6:49 AM  Pager: [phone] PGY-1

## 2018-02-11 NOTE — ED Notes (Signed)
Attempted to stick pt for blood work, unsuccessful. RN notified.

## 2018-02-11 NOTE — Consult Note (Signed)
Telepsych Consultation   Reason for Consult:  Depressive symptoms Referring Physician: EDP Location of Patient: [facility]  Location of Provider: [place] Department  Patient Identification: Morgan [name] MRN:  [id number] Principal Diagnosis: <principal problem not specified> Diagnosis:   Patient Active Problem List   Diagnosis Date Noted  . Hx of craniotomy [Z98.890] 08/13/2017  . Abnormality of gait [R26.9] 08/23/2016  . Hypoglycemia [E16.2] 05/23/2016  . Generalized weakness [R53.1] 05/22/2016  . Acute metabolic encephalopathy due to hypoglycemia [G93.41, E16.2] 05/22/2016  . Transaminitis [R74.0] 05/22/2016  . CKD (chronic kidney disease) stage 4, GFR 15-29 ml/min (HCC) [N18.4] 05/22/2016  . Urinary retention [R33.9] 05/22/2016  . Parkinsonism ([place]) [G20] 02/24/2016  . Delirium [R41.0] 08/11/2015  . Seizure disorder ([place]) [J57.017] 04/27/2015  . Atrial flutter ([place]) [I48.92] 03/15/2015  . Mitral regurgitation [I34.0] 03/15/2015  . Cognitive impairment [R41.89] 07/23/2014  . 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 Essential hypertension [I10]   . Gastroesophageal reflux disease without esophagitis [K21.9]   . History of Meningioma  [D32.9]   . Primary gout [M10.00]   . Acute encephalopathy [G93.40] 05/03/2014  . Abnormal head CT [R93.0]   . Left hemiparesis ([place]) [G81.94]   . 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 Thyroiditis, autoimmune [E06.3]   . Abnormal liver function tests [R94.5]   . Sleep apnea, obstructive [G47.33]   . History of gastroesophageal reflux (GERD) [Z87.19]   . Depression [F32.9]   . Type 2 diabetes mellitus with polyneuropathy (HCC) [E11.42]   . COPD (chronic obstructive pulmonary disease) ([place]) [J44.9]   . Goiter [E04.9]   . Vertigo [R42]   . Gout [274]   . Mixed hyperlipidemia [E78.2] 08/01/2010  . Obesity [E66.9] 08/01/2010     Total Time spent with patient: 20 minutes  Subjective:   Morgan [name] is a 77 y.o. female patient admitted with increased depression after recent death of husband.   HPI:     Per initial Tele Assessment Note by Beecher Mcardle, Counselor on 02/10/2018:  Morgan [name] is an 77 y.o. female who presented to [facility] and reported that she was having suicidal thoughts to overdose on pills.  Patient is disabled and is currently living alone.  Her husband of fifty-six years and who was also her primary caregiver died two months ago.  Patient states that since he is gone that she feels like she has become a burden to her children.  Patient states that she has experienced thoughts of killing herself so her kids will not have to take care of her.  When asked if she would actually do it, she states, "I don't know."  Patient has been struggling with depression since she has become disabled and has been taking anti-depressants as prescribed by her PCP.  Patient states that she has never had any formal mental health treatment on an inpatient or outpatient basis.  Patient denies any prior attempts of self-harm or self-mutilation.  Patient states that she has never experienced any homicidal thought or features of psychosis.  Patient denies any history of substance abuse.  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  Patient states that she has two children ages 109 and 26.  She states that she has been sleeping on average seven hours per night and states that her appetite has been good and states that she has recently gained eight pounds.  Patient states: "I am hungry now."  Patient states  that she gets a little confused at times and states, "I don't know what I am doing."  Patient has ambulatory issues and requires a walker and person assistance with her ADLS.  Patient states that she has no history of abuse or legal issues.  TTS spoke patient's children,  Jamel Dunton [phone] and Adele Barthel  [phone]) who were in the room with the patient and they indicated that patient is only left alone at home a few hours a day and patient's daughter stated that she manages her medications.  They state that their mother (patient) has been very sad since their father died and also due to her inability to care for herself.  They were asked if they felt like their mother could benefit from a psychiatric admission, but they were not sure.  TTS asked them to talk about it as a family and report back to TTS their thoughts.  TTS notified nurse to call TTS once family had weighed in on their options.  Gero-psych placement was offered.  Patient presented as alert and oriented.  Her memory intact and her thoughts were organized.  Patient stated that she was experiencing some confusion and concentration issues at times.  Patient's mood was sad and depressed and her affect was flat.  Her insight, judgment and impulse control were mildly impaired.  Her psycho-motor activity was normal.  She did not appear to be responding to internal stimuli.   Per am psychiatric evaluation:  Patient calm and cooperative during assessment. She is alert and oriented times three. Patient is currently denying any active suicidal ideation or plan. She states "I'm still depressed but doing better. I realize that I have more love and support from my children now. I have not intentions of harming myself. I might have been feeling that way before I came in but not now. I have a Psychiatrist that I see in [place]. I feel ready to go home." Patient admits to active symptoms of grief but is motivated to live for her adult children.    Past Psychiatric History: MDD  Risk to Self: Suicidal Ideation: Denies  Suicidal Intent: (questionable) Is patient at risk for suicide?: Yes Suicidal Plan?: Denies on 02/11/2018  Specify Current Suicidal Plan: (overdose) Access to Means: No caretaker administers medications Specify Access to Suicidal  Means: (Rx pills) What has been your use of drugs/alcohol within the last 12 months?: none How many times?: 0 Other Self Harm Risks: (recent death of husband) Triggers for Past Attempts: None known Intentional Self Injurious Behavior: None Risk to Others: Homicidal Ideation: No Thoughts of Harm to Others: No Current Homicidal Intent: No Current Homicidal Plan: No Access to Homicidal Means: No Identified Victim: none History of harm to others?: No Assessment of Violence: None Noted Violent Behavior Description: (none) Does patient have access to weapons?: No Criminal Charges Pending?: No Does patient have a court date: No Prior Inpatient Therapy: Prior Inpatient Therapy: No Prior Outpatient Therapy: Prior Outpatient Therapy: No Does patient have an ACCT team?: No Does patient have Intensive In-House Services?  : No Does patient have [facility] services? : No Does patient have P4CC services?: No  Past Medical History:  Past Medical History:  Diagnosis Date  . Abnormal liver function tests   . Acquired autoimmune hypothyroidism   . Basal cell carcinoma    Chest  . Cancer (HCC)    Breast  . Combined hyperlipidemia   . COPD with acute exacerbation ([place])   . Depression   . Diabetes  mellitus   . Diabetes type 2, uncontrolled ([place])   . DM neuropathy with neurologic complication ([place])   . Fracture    Left wrist  . Goiter   . Gout   . History of gastroesophageal reflux (GERD)   . Hypertension   . Hypoglycemia associated with diabetes ([place])   . Meningioma ([place])   . Obesity   . Renal insufficiency   . Seizure ([place])   . Shoulder fracture, right 07/2015  . Sleep apnea, obstructive   . Thyroiditis, autoimmune   . Type II diabetes mellitus with peripheral angiopathy ([place])   . Vertigo     Past Surgical History:  Procedure Laterality Date  . APPENDECTOMY    . BACK SURGERY    . BASAL CELL CARCINOMA EXCISION     Chest  . CHOLECYSTECTOMY    . CRANIOTOMY Right 05/08/2014    Procedure: CRANIOTOMY FOR MENINGIOMA;  Surgeon: Ashok Pall, MD;  Location: [facility]URO [facility]S;  Service: Neurosurgery;  Laterality: Right;  Right Craniotomy for meningioma  . ESOPHAGOGASTRODUODENOSCOPY (EGD) WITH PROPOFOL N/A 05/22/2014   Procedure: ESOPHAGOGASTRODUODENOSCOPY (EGD) WITH PROPOFOL;  Surgeon: Wonda Horner, MD;  Location[facility] ENDOSCOPY;  Service: Endoscopy;  Laterality: N/A;  . LAPAROSCOPIC GASTRIC BANDING    . MASTECTOMY Left    Family History:  Family History  Problem Relation Age of Onset  . Melanoma Mother   . Diabetes Father   . CAD Father   . Diabetes Sister   . Diabetes Brother    Family Psychiatric  History: None Social History:  Social History   Substance and Sexual Activity  Alcohol Use No     Social History   Substance and Sexual Activity  Drug Use No    Social History   Socioeconomic History  . Marital status: Married    Spouse name: Konrad Dolores  . Number of children: 2  . Years of education: 75  . Highest education level: Not on file  Occupational History  . Occupation: Retired  Scientific laboratory technician  . Financial resource strain: Not on file  . Food insecurity:    Worry: Not on file    Inability: Not on file  . Transportation needs:    Medical: Not on file    Non-medical: Not on file  Tobacco Use  . Smoking status: Never Smoker  . Smokeless tobacco: Never Used  Substance and Sexual Activity  . Alcohol use: No  . Drug use: No  . Sexual activity: Never  Lifestyle  . Physical activity:    Days per week: Not on file    Minutes per session: Not on file  . Stress: Not on file  Relationships  . Social connections:    Talks on phone: Not on file    Gets together: Not on file    Attends religious service: Not on file    Active member of club or organization: Not on file    Attends meetings of clubs or organizations: Not on file    Relationship status: Not on file  Other Topics Concern  . Not on file  Social History Narrative   02/13/17 living at home  with spouse   Right-handed.   No caffeine use.   Additional Social History:    Allergies:   Allergies  Allergen Reactions  . Latex Rash  . Wellbutrin [Bupropion] Other (See Comments)    Unable to stand.   . Keppra [Levetiracetam] Other (See Comments)    Dementia.  . Statins   . [place] 10  Mcg Pen [Exenatide] Rash  . Metformin Rash    RENAL INSUFFICIENCY-takes low dose  . Penicillins Rash  . Sulfa Antibiotics Other (See Comments)    Unknown allergic reaction    Labs:  Results for orders placed or performed during the hospital encounter of 02/09/18 (from the past 48 hour(s))  CBG monitoring, ED     Status: Abnormal   Collection Time: 02/09/18 10:04 PM  Result Value Ref Range   Glucose-Capillary 149 (H) 70 - 99 mg/dL  CBC with Differential     Status: Abnormal   Collection Time: 02/09/18 11:25 PM  Result Value Ref Range   WBC 8.2 4.0 - 10.5 K/uL   RBC 3.47 (L) 3.87 - 5.11 MIL/uL   Hemoglobin 10.0 (L) 12.0 - 15.0 g/dL   HCT 33.6 (L) 36.0 - 46.0 %   MCV 96.8 80.0 - 100.0 fL   MCH 28.8 26.0 - 34.0 pg   MCHC 29.8 (L) 30.0 - 36.0 g/dL   RDW 14.3 11.5 - 15.5 %   Platelets 194 150 - 400 K/uL   nRBC 0.0 0.0 - 0.2 %   Neutrophils Relative % 60 %   Neutro Abs 4.9 1.7 - 7.7 K/uL   Lymphocytes Relative 26 %   Lymphs Abs 2.2 0.7 - 4.0 K/uL   Monocytes Relative 8 %   Monocytes Absolute 0.7 0.1 - 1.0 K/uL   Eosinophils Relative 4 %   Eosinophils Absolute 0.3 0.0 - 0.5 K/uL   Basophils Relative 1 %   Basophils Absolute 0.1 0.0 - 0.1 K/uL   Immature Granulocytes 1 %   Abs Immature Granulocytes 0.08 (H) 0.00 - 0.07 K/uL    Comment: Performed at [facility], [address], [place], [place] [address]  Basic metabolic panel     Status: Abnormal   Collection Time: 02/09/18 11:25 PM  Result Value Ref Range   Sodium 136 135 - 145 mmol/L   Potassium 4.8 3.5 - 5.1 mmol/L   Chloride 100 98 - 111 mmol/L   CO2 25 22 - 32 mmol/L   Glucose, Bld 173 (H) 70 - 99 mg/dL   BUN 29 (H)  8 - 23 mg/dL   Creatinine, Ser 1.79 (H) 0.44 - 1.00 mg/dL   Calcium 8.6 (L) 8.9 - 10.3 mg/dL   GFR calc non Af Amer 26 (L) >60 mL/min   GFR calc Af Amer 30 (L) >60 mL/min    Comment: (NOTE) The eGFR has been calculated using the CKD EPI equation. This calculation has not been validated in all clinical situations. eGFR's persistently <60 mL/min signify possible Chronic Kidney Disease.    Anion gap 11 5 - 15    Comment: Performed at [facility] [address], [place], [place] [address]  CBG monitoring, ED     Status: Abnormal   Collection Time: 02/10/18  1:33 AM  Result Value Ref Range   Glucose-Capillary 165 (H) 70 - 99 mg/dL   Comment 1 Notify RN   CBG monitoring, ED     Status: Abnormal   Collection Time: 02/10/18  6:56 AM  Result Value Ref Range   Glucose-Capillary 219 (H) 70 - 99 mg/dL  CBG monitoring, ED     Status: Abnormal   Collection Time: 02/10/18  8:32 AM  Result Value Ref Range   Glucose-Capillary 216 (H) 70 - 99 mg/dL  Urinalysis, Routine w reflex microscopic     Status: Abnormal   Collection Time: 02/10/18  3:50 PM  Result  Value Ref Range   Color, Urine YELLOW YELLOW   APPearance CLEAR CLEAR   Specific Gravity, Urine 1.014 1.005 - 1.030   pH 7.0 5.0 - 8.0   Glucose, UA 50 (A) NEGATIVE mg/dL   Hgb urine dipstick NEGATIVE NEGATIVE   Bilirubin Urine NEGATIVE NEGATIVE   Ketones, ur NEGATIVE NEGATIVE mg/dL   Protein, ur >=300 (A) NEGATIVE mg/dL   Nitrite NEGATIVE NEGATIVE   Leukocytes, UA NEGATIVE NEGATIVE   RBC / HPF 0-5 0 - 5 RBC/hpf   WBC, UA 0-5 0 - 5 WBC/hpf   Bacteria, UA RARE (A) NONE SEEN   Hyaline Casts, UA PRESENT    Non Squamous Epithelial 0-5 (A) NONE SEEN    Comment: Performed at [facility] [address], [place], [place] [address]  Rapid urine drug screen (hospital performed)     Status: Abnormal   Collection Time: 02/10/18  3:50 PM  Result Value Ref Range   Opiates POSITIVE (A) NONE DETECTED   Cocaine NONE DETECTED NONE  DETECTED   Benzodiazepines NONE DETECTED NONE DETECTED   Amphetamines NONE DETECTED NONE DETECTED   Tetrahydrocannabinol NONE DETECTED NONE DETECTED   Barbiturates NONE DETECTED NONE DETECTED    Comment: (NOTE) DRUG SCREEN FOR MEDICAL PURPOSES ONLY.  IF CONFIRMATION IS NEEDED FOR ANY PURPOSE, NOTIFY LAB WITHIN 5 DAYS. LOWEST DETECTABLE LIMITS FOR URINE DRUG SCREEN Drug Class                     Cutoff (ng/mL) Amphetamine and metabolites    1000 Barbiturate and metabolites    200 Benzodiazepine                 474 Tricyclics and metabolites     300 Opiates and metabolites        300 Cocaine and metabolites        300 THC                            50 Performed at [facility], [place] [address] [place], [place] [address]   CBG monitoring, ED     Status: Abnormal   Collection Time: 02/10/18  5:43 PM  Result Value Ref Range   Glucose-Capillary 237 (H) 70 - 99 mg/dL  CBG monitoring, ED     Status: Abnormal   Collection Time: 02/10/18  9:35 PM  Result Value Ref Range   Glucose-Capillary 250 (H) 70 - 99 mg/dL  CBG monitoring, ED     Status: Abnormal   Collection Time: 02/11/18  7:54 AM  Result Value Ref Range   Glucose-Capillary 276 (H) 70 - 99 mg/dL    Medications:  Current Facility-Administered Medications  Medication Dose Route Frequency Provider Last Rate Last Dose  . acetaminophen (TYLENOL) tablet 650 mg  650 mg Oral L8V PRN Delora Fuel, MD   564 mg at 02/10/18 2137  . albuterol (PROVENTIL) (2.5 MG/3ML) 0.083% nebulizer solution 2.5 mg  2.5 mg Nebulization Q2H PRN Virgel Manifold, MD      . ALPRAZolam Duanne Moron) tablet 0.5 mg  0.5 mg Oral P3I PRN Delora Fuel, MD   0.5 mg at 02/10/18 2137  . alum & mag hydroxide-simeth (MAALOX/MYLANTA) 200-200-20 MG/5ML suspension 30 mL  30 mL Oral Q6H PRN Virgel Manifold, MD      . amiodarone (PACERONE) tablet 100 mg  100 mg Oral Daily Virgel Manifold, MD   100 mg at 02/11/18 1007  . ARIPiprazole (  ABILIFY) tablet 5 mg  5 mg Oral QPM Delora Fuel, MD   5 mg at 02/10/18 1810  . aspirin EC tablet 81 mg  81 mg Oral Daily Virgel Manifold, MD   81 mg at 02/11/18 1008  . calcium-vitamin D (OSCAL WITH D) 500-200 MG-UNIT per tablet 1 tablet  1 tablet Oral Daily Delora Fuel, MD   1 tablet at 02/11/18 1006  . [START ON 02/15/2018] cloNIDine (CATAPRES - Dosed in mg/24 hr) patch 0.3 mg  0.3 mg Transdermal Q Mel Almond, MD      . colchicine tablet 0.6 mg  0.6 mg Oral Daily PRN Delora Fuel, MD      . DULoxetine (CYMBALTA) DR capsule 60 mg  60 mg Oral BID Delora Fuel, MD   60 mg at 02/11/18 1008  . furosemide (LASIX) tablet 40 mg  40 mg Oral Daily Delora Fuel, MD   40 mg at 02/11/18 1016  . insulin aspart (novoLOG) injection 0-15 Units  0-15 Units Subcutaneous TID WC Delora Fuel, MD   8 Units at 02/11/18 0805  . lamoTRIgine (LAMICTAL) tablet 100 mg  100 mg Oral BID Virgel Manifold, MD   100 mg at 02/11/18 1008  . levothyroxine (SYNTHROID, LEVOTHROID) tablet 200 mcg  200 mcg Oral QAC breakfast Virgel Manifold, MD   Stopped at 02/11/18 [phone]  . liraglutide (VICTOZA) SOPN 1.8 mg  1.8 mg Subcutaneous Daily Virgel Manifold, MD      . lubiprostone (AMITIZA) capsule 8 mcg  8 mcg Oral BID WC Virgel Manifold, MD   8 mcg at 02/11/18 2706  . metoprolol tartrate (LOPRESSOR) tablet 50 mg  50 mg Oral BID Virgel Manifold, MD   50 mg at 02/11/18 1008  . mometasone-formoterol (DULERA) 100-5 MCG/ACT inhaler 2 puff  2 puff Inhalation BID Virgel Manifold, MD   2 puff at 02/11/18 0806  . multivitamin with minerals tablet 1 tablet  1 tablet Oral Daily Delora Fuel, MD   1 tablet at 02/11/18 1007  . ondansetron (ZOFRAN) tablet 4 mg  4 mg Oral C3J PRN Delora Fuel, MD   4 mg at 02/10/18 2137  . pantoprazole (PROTONIX) EC tablet 40 mg  40 mg Oral BID Virgel Manifold, MD   40 mg at 02/11/18 1008  . senna (SENOKOT) tablet 17.2 mg  2 tablet Oral QHS Virgel Manifold, MD   17.2 mg at 02/10/18 2137  . traMADol (ULTRAM) tablet 50 mg  50 mg Oral Q12H PRN Virgel Manifold, MD   50  mg at 02/10/18 2137  . vitamin B-12 (CYANOCOBALAMIN) tablet 1,000 mcg  1,000 mcg Oral Daily Delora Fuel, MD   6,283 mcg at 02/11/18 1007   Current Outpatient Medications  Medication Sig Dispense Refill  . acetaminophen (TYLENOL) 325 MG tablet Take 650 mg by mouth every 6 (six) hours as needed for mild pain.     Marland Kitchen albuterol (PROVENTIL) (2.5 MG/3ML) 0.083% nebulizer solution Take 3 mLs (2.5 mg total) by nebulization every 2 (two) hours as needed for shortness of breath. 75 mL 12  . ALPRAZolam (XANAX) 0.5 MG tablet Take 0.5 mg by mouth every 8 (eight) hours as needed for anxiety.    Marland Kitchen amiodarone (PACERONE) 200 MG tablet TAKE 1 TABLET (200 MG TOTAL) BY MOUTH DAILY. (Patient taking differently: Take 100 mg by mouth daily. TAKE 1 TABLET (200 MG TOTAL) BY MOUTH DAILY.) 90 tablet 3  . ARIPiprazole (ABILIFY) 5 MG tablet Take 5 mg by mouth every evening.    Marland Kitchen aspirin  EC 81 MG tablet Take 1 tablet (81 mg total) by mouth daily. 90 tablet 3  . budesonide-formoterol (SYMBICORT) 80-4.5 MCG/ACT inhaler Inhale 2 puffs into the lungs 2 (two) times daily.    . Calcium Carb-Cholecalciferol (CALCIUM 600+D) 600-800 MG-UNIT TABS Take 1 tablet by mouth daily.    . cloNIDine (CATAPRES - DOSED IN MG/24 HR) 0.3 mg/24hr patch Place 0.3 mg onto the skin every Friday.     . colchicine 0.6 MG tablet Take 0.6 mg by mouth daily as needed (for gout).     . DULoxetine (CYMBALTA) 60 MG capsule Take 60 mg by mouth 2 (two) times daily.     . furosemide (LASIX) 40 MG tablet Take 40 mg by mouth daily.     Marland Kitchen HUMULIN R 500 UNIT/ML injection Inject 5-10 Units into the skin 3 (three) times daily with meals. Sliding scale 150 and below no Insulin  Breakfast  151-200 = 9 units 201-250= 10 units 251-300= 11 units 301-350=12 units 351-400= 13 units 4001-455= 14 units Above 450 = 15 units  Lunch  151-200= 8 units 201-250= 9 units 251-300= 10 units 301-350= 11 units 351-400= 12 units 401-450= 13 units Above 450 = 14  units  Supper 151-200= 4 units 201-250= 5 units 251-300= 6 units 301-350=7 units 351-400= 8 units 401-450= 9 units Above 450 = 10 units  NO INSULIN AFTER SUPPER, EVEN IF IT'S HIGH BECAUSE HER LEVELS WILL DROP DURING THE NIGHT. PLEASE GIVE A SNACK  1  . lamoTRIgine (LAMICTAL) 100 MG tablet Take 100 mg by mouth 2 (two) times daily.    Marland Kitchen levothyroxine (SYNTHROID, LEVOTHROID) 200 MCG tablet Take 1 tablet (200 mcg total) by mouth daily before breakfast. (Patient taking differently: Take 200 mcg by mouth See admin instructions. Take 2 tablets on Sunday then take 1 tablet on all other days) 90 tablet 3  . liraglutide (VICTOZA) 18 MG/3ML SOPN Inject 1.8 mg into the skin daily. Must take at with First dose if Inulin Must eat after    . lubiprostone (AMITIZA) 8 MCG capsule Take 8 mcg by mouth 2 (two) times daily with a meal.    . metoprolol tartrate (LOPRESSOR) 50 MG tablet Take 50 mg by mouth 2 (two) times daily.  2  . Multiple Vitamin (MULTIVITAMIN WITH MINERALS) TABS tablet Take 1 tablet by mouth daily.    . nitroGLYCERIN (NITROSTAT) 0.4 MG SL tablet Place 0.4 mg under the tongue every 5 (five) minutes as needed for chest pain.    Marland Kitchen nystatin (MYCOSTATIN/NYSTOP) powder Apply 1 g topically daily as needed (irritation).    . pantoprazole (PROTONIX) 40 MG tablet Take 40 mg by mouth 2 (two) times daily.     Marland Kitchen senna (SENOKOT) 8.6 MG TABS tablet Take 2 tablets by mouth at bedtime.     . vitamin B-12 (CYANOCOBALAMIN) 1000 MCG tablet Take 1,000 mcg by mouth daily.      Musculoskeletal:  Unable to assess via camera   Psychiatric Specialty Exam: Physical Exam  Psychiatric: Her speech is normal and behavior is normal. Judgment and thought content normal. Cognition and memory are normal. She exhibits a depressed mood.    ROS  Blood pressure (!) 162/54, pulse 63, temperature 98.1 F (36.7 C), temperature source Oral, resp. rate 16, SpO2 96 %.There is no height or weight on file to calculate BMI.   General Appearance: Casual  Eye Contact:  Good  Speech:  Clear and Coherent  Volume:  Normal  Mood:  Depressed  Affect:  Appropriate  Thought Process:  Coherent and Goal Directed  Orientation:  Full (Time, Place, and Person)  Thought Content:  WDL  Suicidal Thoughts:  No  Homicidal Thoughts:  No  Memory:  Immediate;   Good Recent;   Good Remote;   Good  Judgement:  Fair  Insight:  Good  Psychomotor Activity:  Normal  Concentration:  Concentration: Good and Attention Span: Good  Recall:  Good  Fund of Knowledge:  Good  Language:  Good  Akathisia:  No  Handed:  Right  AIMS (if indicated):     Assets:  Communication Skills Desire for Improvement Financial Resources/Insurance Housing Intimacy Leisure Time [place] Talents/Skills Transportation  ADL's:  Intact  Cognition:  WNL  Sleep:        Treatment Plan Summary: Plan Discharge home with outpatient follow up in place.   Disposition: No evidence of imminent risk to self or others at present.   Patient does not meet criteria for psychiatric inpatient admission. Discussed crisis plan, support from social network, calling 911, coming to the Emergency Department, and calling Suicide Hotline.  This service was provided via telemedicine using a 2-way, interactive audio and video technology.  Names of all persons participating in this telemedicine service and their role in this encounter. Name: Rondell Reams  Role: Patient  Name: Elmarie Shiley  Role: PMHNP-C  Name:  Role:   Name:  Role:     Elmarie Shiley, NP 02/11/2018 10:54 AM

## 2018-02-12 ENCOUNTER — Encounter (HOSPITAL_COMMUNITY): Payer: Self-pay | Admitting: Student in an Organized Health Care Education/Training Program

## 2018-02-12 ENCOUNTER — Inpatient Hospital Stay (HOSPITAL_COMMUNITY): Payer: Medicare Other

## 2018-02-12 DIAGNOSIS — Z8249 Family history of ischemic heart disease and other diseases of the circulatory system: Secondary | ICD-10-CM

## 2018-02-12 DIAGNOSIS — I272 Pulmonary hypertension, unspecified: Secondary | ICD-10-CM

## 2018-02-12 DIAGNOSIS — E039 Hypothyroidism, unspecified: Secondary | ICD-10-CM

## 2018-02-12 DIAGNOSIS — R45851 Suicidal ideations: Secondary | ICD-10-CM

## 2018-02-12 DIAGNOSIS — Z833 Family history of diabetes mellitus: Secondary | ICD-10-CM

## 2018-02-12 DIAGNOSIS — F339 Major depressive disorder, recurrent, unspecified: Secondary | ICD-10-CM

## 2018-02-12 DIAGNOSIS — Z853 Personal history of malignant neoplasm of breast: Secondary | ICD-10-CM

## 2018-02-12 DIAGNOSIS — Z79899 Other long term (current) drug therapy: Secondary | ICD-10-CM

## 2018-02-12 DIAGNOSIS — G4733 Obstructive sleep apnea (adult) (pediatric): Secondary | ICD-10-CM

## 2018-02-12 DIAGNOSIS — Z86011 Personal history of benign neoplasm of the brain: Secondary | ICD-10-CM

## 2018-02-12 DIAGNOSIS — Z79891 Long term (current) use of opiate analgesic: Secondary | ICD-10-CM

## 2018-02-12 DIAGNOSIS — Z880 Allergy status to penicillin: Secondary | ICD-10-CM

## 2018-02-12 DIAGNOSIS — J439 Emphysema, unspecified: Secondary | ICD-10-CM

## 2018-02-12 DIAGNOSIS — F419 Anxiety disorder, unspecified: Secondary | ICD-10-CM

## 2018-02-12 DIAGNOSIS — Z9012 Acquired absence of left breast and nipple: Secondary | ICD-10-CM

## 2018-02-12 DIAGNOSIS — F4321 Adjustment disorder with depressed mood: Secondary | ICD-10-CM

## 2018-02-12 DIAGNOSIS — E1122 Type 2 diabetes mellitus with diabetic chronic kidney disease: Secondary | ICD-10-CM

## 2018-02-12 DIAGNOSIS — Z794 Long term (current) use of insulin: Secondary | ICD-10-CM

## 2018-02-12 DIAGNOSIS — Z91040 Latex allergy status: Secondary | ICD-10-CM

## 2018-02-12 DIAGNOSIS — G40909 Epilepsy, unspecified, not intractable, without status epilepticus: Secondary | ICD-10-CM

## 2018-02-12 DIAGNOSIS — I071 Rheumatic tricuspid insufficiency: Secondary | ICD-10-CM

## 2018-02-12 DIAGNOSIS — N183 Chronic kidney disease, stage 3 (moderate): Secondary | ICD-10-CM

## 2018-02-12 DIAGNOSIS — Z79890 Hormone replacement therapy (postmenopausal): Secondary | ICD-10-CM

## 2018-02-12 DIAGNOSIS — Z66 Do not resuscitate: Secondary | ICD-10-CM

## 2018-02-12 DIAGNOSIS — Z7982 Long term (current) use of aspirin: Secondary | ICD-10-CM

## 2018-02-12 DIAGNOSIS — N179 Acute kidney failure, unspecified: Secondary | ICD-10-CM

## 2018-02-12 DIAGNOSIS — I130 Hypertensive heart and chronic kidney disease with heart failure and stage 1 through stage 4 chronic kidney disease, or unspecified chronic kidney disease: Principal | ICD-10-CM

## 2018-02-12 DIAGNOSIS — Z882 Allergy status to sulfonamides status: Secondary | ICD-10-CM

## 2018-02-12 DIAGNOSIS — Z888 Allergy status to other drugs, medicaments and biological substances status: Secondary | ICD-10-CM

## 2018-02-12 DIAGNOSIS — I4892 Unspecified atrial flutter: Secondary | ICD-10-CM

## 2018-02-12 DIAGNOSIS — K219 Gastro-esophageal reflux disease without esophagitis: Secondary | ICD-10-CM

## 2018-02-12 DIAGNOSIS — I2510 Atherosclerotic heart disease of native coronary artery without angina pectoris: Secondary | ICD-10-CM

## 2018-02-12 DIAGNOSIS — K7581 Nonalcoholic steatohepatitis (NASH): Secondary | ICD-10-CM

## 2018-02-12 DIAGNOSIS — I5030 Unspecified diastolic (congestive) heart failure: Secondary | ICD-10-CM

## 2018-02-12 DIAGNOSIS — I5033 Acute on chronic diastolic (congestive) heart failure: Secondary | ICD-10-CM

## 2018-02-12 DIAGNOSIS — Z7951 Long term (current) use of inhaled steroids: Secondary | ICD-10-CM

## 2018-02-12 LAB — BASIC METABOLIC PANEL
Anion gap: 8 (ref 5–15)
BUN: 38 mg/dL — AB (ref 8–23)
CHLORIDE: 98 mmol/L (ref 98–111)
CO2: 28 mmol/L (ref 22–32)
CREATININE: 2.2 mg/dL — AB (ref 0.44–1.00)
Calcium: 8.6 mg/dL — ABNORMAL LOW (ref 8.9–10.3)
GFR calc Af Amer: 24 mL/min — ABNORMAL LOW (ref >60)
GFR calc non Af Amer: 20 mL/min — ABNORMAL LOW (ref >60)
GLUCOSE: 241 mg/dL — AB (ref 70–99)
Potassium: 4.4 mmol/L (ref 3.5–5.1)
Sodium: 134 mmol/L — ABNORMAL LOW (ref 135–145)

## 2018-02-12 LAB — GLUCOSE, CAPILLARY
GLUCOSE-CAPILLARY: 282 mg/dL — AB (ref 70–99)
GLUCOSE-CAPILLARY: 219 mg/dL — AB (ref 70–99)
Glucose-Capillary: 297 mg/dL — ABNORMAL HIGH (ref 70–99)
Glucose-Capillary: 283 mg/dL — ABNORMAL HIGH (ref 70–99)
Glucose-Capillary: 266 mg/dL — ABNORMAL HIGH (ref 70–99)

## 2018-02-12 LAB — ECHOCARDIOGRAM COMPLETE
Height: 64 in
Weight: 3753.11 oz

## 2018-02-12 LAB — CBC
HCT: 28.7 % — ABNORMAL LOW (ref 36.0–46.0)
Hemoglobin: 9.1 g/dL — ABNORMAL LOW (ref 12.0–15.0)
MCH: 29.4 pg (ref 26.0–34.0)
MCHC: 31.7 g/dL (ref 30.0–36.0)
MCV: 92.9 fL (ref 80.0–100.0)
Platelets: 163 10*3/uL (ref 150–400)
RBC: 3.09 MIL/uL — ABNORMAL LOW (ref 3.87–5.11)
RDW: 14.4 % (ref 11.5–15.5)
WBC: 7.7 10*3/uL (ref 4.0–10.5)
nRBC: 0 % (ref 0.0–0.2)

## 2018-02-12 MED ORDER — METOPROLOL TARTRATE 50 MG PO TABS: 50.0000 mg | ORAL_TABLET | Freq: Once | ORAL | Status: DC

## 2018-02-12 MED ORDER — INSULIN REGULAR HUMAN (CONC) 500 UNIT/ML ~~LOC~~ SOPN
45.0000 [IU] | PEN_INJECTOR | Freq: Every day | SUBCUTANEOUS | Status: DC
  Administered 2018-02-13: 50 [IU] via SUBCUTANEOUS
  Administered 2018-02-14: 45 [IU] via SUBCUTANEOUS
  Administered 2018-02-15: 25 [IU] via SUBCUTANEOUS
  Administered 2018-02-16: 45 [IU] via SUBCUTANEOUS

## 2018-02-12 MED ORDER — HYDRALAZINE HCL 20 MG/ML IJ SOLN
5.0000 mg | Freq: Once | INTRAMUSCULAR | Status: AC
  Administered 2018-02-12: 5 mg via INTRAVENOUS
  Filled 2018-02-12: qty 1

## 2018-02-12 MED ORDER — FUROSEMIDE 10 MG/ML IJ SOLN
40.0000 mg | Freq: Two times a day (BID) | INTRAMUSCULAR | Status: DC
  Administered 2018-02-12 – 2018-02-13 (×4): 40 mg via INTRAVENOUS
  Filled 2018-02-12 (×4): qty 4

## 2018-02-12 MED ORDER — INSULIN REGULAR HUMAN (CONC) 500 UNIT/ML ~~LOC~~ SOPN
40.0000 [IU] | PEN_INJECTOR | Freq: Every day | SUBCUTANEOUS | Status: DC
  Administered 2018-02-13: 50 [IU] via SUBCUTANEOUS
  Administered 2018-02-14: 55 [IU] via SUBCUTANEOUS
  Administered 2018-02-16: 45 [IU] via SUBCUTANEOUS
  Administered 2018-02-17: 40 [IU] via SUBCUTANEOUS
  Administered 2018-02-18: 50 [IU] via SUBCUTANEOUS

## 2018-02-12 MED ORDER — INSULIN ASPART 100 UNIT/ML ~~LOC~~ SOLN: 0.0000 [IU] | Freq: Three times a day (TID) | SUBCUTANEOUS | Status: DC

## 2018-02-12 MED ORDER — HYDRALAZINE HCL 20 MG/ML IJ SOLN
2.0000 mg | Freq: Once | INTRAMUSCULAR | Status: AC
  Administered 2018-02-12: 2 mg via INTRAVENOUS
  Filled 2018-02-12: qty 1

## 2018-02-12 MED ORDER — INSULIN REGULAR HUMAN (CONC) 500 UNIT/ML ~~LOC~~ SOPN
20.0000 [IU] | PEN_INJECTOR | Freq: Every day | SUBCUTANEOUS | Status: DC
  Administered 2018-02-12: 30 [IU] via SUBCUTANEOUS
  Administered 2018-02-14: 20 [IU] via SUBCUTANEOUS
  Filled 2018-02-12: qty 3

## 2018-02-12 NOTE — Progress Notes (Signed)
RN paged MD for prn order for BP.

## 2018-02-12 NOTE — Evaluation (Signed)
Occupational Therapy Evaluation Patient Details Name: Morgan [name] MRN: [id number] DOB: Nov 30, 1940 Today's Date: 02/12/2018    History of Present Illness Morgan [name] is a 77 year old woman with chronic diastolic heart failure EF 65-70%, atrial flutter, arthrosclerotic CAD, CKD III/IV, pulmonary HTN, obstructive sleep apnea, DM2, hypothyroidism, nonalcoholic steatohepatitis, seizure disorder, emphysema, major depressive disorder, meningioma s/p right frontal craniotomy, breast cancer s/p left mastectomy and abnormal gait presenting for evaluation of SOB and anasarca   Clinical Impression   [name] admitted with above presents to acute OT with needing some A pta for basic ADLs, but needing more now (pt Min-total A). Pt recently lost her husband unexpectedly as well. She will benefit from acute OT with follow up at SNF. We will continue to follow acutely.    Follow Up Recommendations  SNF;Supervision/Assistance - 24 hour    Equipment Recommendations  3 in 1 bedside commode       Precautions / Restrictions Precautions Precautions: Fall Precaution Comments: 12 falls in last year per dtr Restrictions Weight Bearing Restrictions: No      Mobility Bed Mobility Overal bed mobility: Needs Assistance Bed Mobility: Supine to Sit     Supine to sit: HOB elevated        Transfers Overall transfer level: Needs assistance Equipment used: 4-wheeled walker Transfers: Sit to/from Stand Sit to Stand: Min assist         General transfer comment: increased time; pt ambulated 25 feet x2    Balance Overall balance assessment: Needs assistance Sitting-balance support: No upper extremity supported;Feet supported Sitting balance-Leahy Scale: Fair     Standing balance support: Bilateral upper extremity supported;During functional activity Standing balance-Leahy Scale: Poor Standing balance comment: reliant on rollator and additonal external support                            ADL either performed or assessed with clinical judgement   ADL Overall ADL's : Needs assistance/impaired Eating/Feeding: Independent;Sitting   Grooming: Minimal assistance;Sitting   Upper Body Bathing: Minimal assistance;Sitting   Lower Body Bathing: Moderate assistance Lower Body Bathing Details (indicate cue type and reason): min A sit<>stand Upper Body Dressing : Minimal assistance;Sitting   Lower Body Dressing: Moderate assistance Lower Body Dressing Details (indicate cue type and reason): min A sit<>stand Toilet Transfer: Minimal assistance;Ambulation;BSC   Toileting- Clothing Manipulation and Hygiene: Total assistance Toileting - Clothing Manipulation Details (indicate cue type and reason): min A sit<>stand       General ADL Comments: Educated pt and dtr on purse lipped breathing     Vision Patient Visual Report: No change from baseline Additional Comments: cannot see to read            Pertinent Vitals/Pain Pain Assessment: No/denies pain     Hand Dominance Right   Extremity/Trunk Assessment Upper Extremity Assessment Upper Extremity Assessment: RUE deficits/detail RUE Deficits / Details: old rotator cuff issues as well as old fx upper arm, shoulder flexion~ 20 degrees RUE Coordination: decreased gross motor           Communication Communication Communication: No difficulties   Cognition Arousal/Alertness: Awake/alert Behavior During Therapy: Flat affect Overall Cognitive Status: Within Functional Limits for tasks assessed  Home Living Family/patient expects to be discharged to:: Skilled nursing facility                                 Additional Comments: Family and aid in and out but not 24/7      Prior Functioning/Environment Level of Independence: Needs assistance  Gait / Transfers Assistance Needed: Uses a rollator ADL's / Homemaking Assistance  Needed: Requires A in and out of shower and some dressing   Comments: Spouse recently passed unexpectedly in his sleep; reports with any activity she "huffs and puffs", sleeps in a lift chair, has a reacher but does not know how to use it for LBD.        OT Problem List: Decreased strength;Decreased range of motion;Impaired balance (sitting and/or standing);Impaired UE functional use;Obesity;Decreased knowledge of use of DME or AE      OT Treatment/Interventions: Self-care/ADL training;Balance training;Therapeutic activities;DME and/or AE instruction;Energy conservation;Patient/family education    OT Goals(Current goals can be found in the care plan section) Acute Rehab OT Goals Patient Stated Goal: rehab then home OT Goal Formulation: With patient/family Time For Goal Achievement: 02/26/18 Potential to Achieve Goals: Good  OT Frequency: Min 2X/week   Barriers to D/C: Decreased caregiver support(does not have 24/7)             AM-PAC PT "6 Clicks" Daily Activity     Outcome Measure Help from another person eating meals?: None Help from another person taking care of personal grooming?: A Little Help from another person toileting, which includes using toliet, bedpan, or urinal?: A Lot Help from another person bathing (including washing, rinsing, drying)?: A Lot Help from another person to put on and taking off regular upper body clothing?: A Little Help from another person to put on and taking off regular lower body clothing?: A Lot 6 Click Score: 16   End of Session Equipment Utilized During Treatment: Gait belt(rollator) Nurse Communication: Mobility status(NT)  Activity Tolerance: Patient tolerated treatment well Patient left: in chair;with call bell/phone within reach;with family/visitor present  OT Visit Diagnosis: Unsteadiness on feet (R26.81);Repeated falls (R29.6);History of falling (Z91.81);Muscle weakness (generalized) (M62.81);Adult, failure to thrive (R62.7)                 Time: 0729-0813 OT Time Calculation (min): 44 min Charges:  OT General Charges $OT Visit: 1 Visit OT Evaluation $OT Eval Moderate Complexity: 1 Mod OT Treatments $Self Care/Home Management : 23-37 mins  [place] Circle, OTR/L Acute [employer] Pager [phone] Office [phone]     Almon Register 02/12/2018, 9:35 AM

## 2018-02-12 NOTE — Progress Notes (Addendum)
Subjective:  Patient is accompanied by her daughter and granddaugther. She continues to feel SOB and feels like has been gaining weight recently and is having trouble walking around easily. She did ambulate down the hall earlier today.  She has been feeling very down since her husband passed away two months ago, and she has had thoughts of taking all of her pills to overdose. She states she is not having those thoughts currently and would not want to leave her son and daughter and two grandchildren behind. She is interested in geropsych facility and finding ways to work through her grief and "just wants to feel better."   She has two caregivers that are with her every day, and her son and daughter take turns staying with her every other night. There are occasional times where she is alone for three hours, but otherwise is never alone.   Objective:  Vital signs in last 24 hours: Vitals:   02/11/18 2219 02/11/18 2221 02/12/18 0519 02/12/18 1339  BP: (!) 208/80 (!) 157/80 (!) 184/67 (!) 181/56  Pulse: 65 61 65 61  Resp: 18  20 20   Temp: 97.6 F (36.4 C)  (!) 97.5 F (36.4 C) 98.5 F (36.9 C)  TempSrc: Oral  Oral Oral  SpO2: 95%  92% 95%  Weight:  109.4 kg 106.4 kg   Height:  5' 4"  (1.626 m)     Physical Exam:  Constitution: tearful, obese, diffuse anasarca Cardio: RRR, no m/r/g Respiratory: course breath sounds, bibasilar crackles, no wheezing MSK: 2+ pitting edema Neuro: a&o, depressed affect, cooperative Skin: c/d/i   Assessment/Plan:  Principal Problem:   Acute on chronic heart failure (HCC) Active Problems:   Major depressive disorder, single episode   Type 2 diabetes mellitus with polyneuropathy (HCC)   Essential hypertension   CKD (chronic kidney disease) stage 4, GFR 15-29 ml/min (HCC)   Acute kidney injury ([place])  77yo female with PMH chronic diastolic heart failure   Acute on Chronic Heart Failure: Hypertension Diffuse anasarca and SOB increased from normal.  Bibasilar crackles on exam and cxr showing CHF and right pleural effusion. Her baseline weight is around 325 and weight at admission 242. We will continue to diurese her to baseline and obtain repeat echo.   - echo pending  - 40 mg IV lasix bid - strict I/o's, daily weights - cont. clinidine and metoprolol   Major Depressive Disorder Patient recently lost her husband two months ago and has had SI intermittently since that time but states she is not currently having SI. Her daughter and son are taking turns staying with her at home and have been monitoring her medications. It does not appear to be a sustainable situation in the long term, and patient would benefit from inpatient treatment for her depression. She also has two caregivers at home with her during the day time. She is motivated to feel better. Home medications include abilify, cymbalta, and alprazolam.   - cont. abilify 5 mg qd - cont. cymbalta 60 mg bid - cont. Alprazolam q8h for anxiety  - appreciate psych's recommendations - will plan for d/c to geropsychiatric unit   A. Flutter: Currently rate controlled with amiodarone. Eliquis held by cardiology due to previous meningioma and history of falls  - cont. Amiodarone   AKI on CKD: Cr today 2.2. From baseline 1.4-1.6. Will continue to monitor as we diurese.   - am BMP   TIIDM She has a history of significant hypoglycemia at night and has been  controlled with humulin U500 SSI and Victoza. Her daughter would like to continue this and pharmacy has spoken with her endocrinologist to confirm dosing.   - restart home humalin U500 SSI  - cont. home victoza - avoid night time insulin  Hypothyroidism: TSH wnl. Cont. synthroid  VTE: lovenox IVF: none Diet: heart healthy/carb, fluid restrict Code: DNR  Dispo: Anticipated discharge in approximately 4-5 days.   Molli Hazard A, DO 02/12/2018, 3:05 PM Pager: [phone]

## 2018-02-12 NOTE — Progress Notes (Signed)
Pt BP= 181/56. RN paged MD for prn order. No new orders.

## 2018-02-12 NOTE — Progress Notes (Signed)
Pt's daughter states pt's ordered insulin is not correct. She states pt takes humulin instead of novolog and has a very specific sliding scale for breakfast, lunch, and supper. RN notified MD. MD states it is okay for pharmacist to adjust insulin order to match pt's PTA meds. Pharmacist states she is working on this.

## 2018-02-12 NOTE — Progress Notes (Signed)
Pharmacy Home Medication Communication High Risk Medication: Humulin U-500 Insulin  Hospital inpatient sliding scale insulin was ordered for this patient, but daughter reported that patient does not respond to Novolog and needs U-500 Insulin with specific scales for breakfast, lunch and supper.  Daughter reports that patient has had issues with low CBGs, so no coverage is given for CBGs < 150 and no insulin is given after supper-time coverage.  Home dose of Humulin U-500 insulin was clarified for this patient, per discussion with daughter and Peri Jefferson, [place] with Endocrinology in [facility]. Since they use U-100 syringe at home, dose provided is actually 5 times the calibration units on the syringe.  The dose was verified with the patient's daughter and provider as listed below:  Type of syringe used at home:  U100 syringe Number or line on syringe to which patient/family draws up insulin:   ranges from 4-15  This equates to 20-75 units of U-500 insulin  Other comments pertinent to patient home dosing:    Breakfast coverage: 45-75 units   Lunch coverage: 40-70 units   Supper coverage: 20-50 units  Discussed with Dr. Sharon Seller. Coverage with U-500 to begin with supper today.  Arty Baumgartner, [place] Pager: [phone] or phone: [phone] 02/12/2018  2:22 PM

## 2018-02-12 NOTE — Progress Notes (Signed)
Pt BP not controlled by ordered hydralazine. RN notified MD. Pt BP= 184/64. MD rounded on pt. MD states they will place new orders on pt.

## 2018-02-12 NOTE — Progress Notes (Signed)
Inpatient Diabetes Program Recommendations  AACE/ADA: New Consensus Statement on Inpatient Glycemic Control (2015)  Target Ranges:  Prepandial:   less than 140 mg/dL      Peak postprandial:   less than 180 mg/dL (1-2 hours)      Critically ill patients:  140 - 180 mg/dL   Lab Results  Component Value Date   GLUCAP 297 (H) 02/12/2018   HGBA1C 6.5 (H) 02/11/2018    Review of Glycemic Control  Diabetes history: DM2 Outpatient Diabetes medications: Victoza 1.8 mg QD, Humulin U-500 5-10 units tidwc, S/S < 150 mg/dL Current orders for Inpatient glycemic control: Novolog 0-15 units tidwc, Victoza 1.8 mg QD  HgbA1C - 6.5% - indicative of good glycemic control. Note H/H low.  Inpatient Diabetes Program Recommendations:     Levemir 10 units Q24H.   Will continue to follow.   Thank you. Lorenda Peck, RD, LDN, CDE Inpatient Diabetes Coordinator [phone]

## 2018-02-12 NOTE — Evaluation (Signed)
Physical Therapy Evaluation Patient Details Name: Morgan [name] MRN: [id number] DOB: 1941-04-02 Today's Date: 02/12/2018   History of Present Illness  Ms. Campanella is a 77 year old woman with chronic diastolic heart failure EF 65-70%, atrial flutter, arthrosclerotic CAD, CKD III/IV, pulmonary HTN, obstructive sleep apnea, DM2, hypothyroidism, nonalcoholic steatohepatitis, seizure disorder, emphysema, major depressive disorder, meningioma s/p right frontal craniotomy, breast cancer s/p left mastectomy and abnormal gait presenting for evaluation of SOB and anasarca.  Of note, pt recent psych admit due to suicidal ideation.   Clinical Impression  Pt admitted with above diagnosis. Pt currently with functional limitations due to the deficits listed below (see PT Problem List). Pt was able to walk a few steps to 3N1 with rollator with min assist and mod cues.  Pt needs cues for safety with Rollator and flexes trunk forward.  Overall poor safety.  Daughter worried as pt has been depressed  Since husband died 2 weeks ago.  She wants pt to get grief counseling.  Feel that SNF is warranted for pt to gain strength.  Will follow acutely.   Pt will benefit from skilled PT to increase their independence and safety with mobility to allow discharge to the venue listed below.      Follow Up Recommendations SNF;Supervision/Assistance - 24 hour    Equipment Recommendations  None recommended by PT    Recommendations for Other Services       Precautions / Restrictions Precautions Precautions: Fall Precaution Comments: 12 falls in last year per dtr Restrictions Weight Bearing Restrictions: No      Mobility  Bed Mobility Overal bed mobility: Needs Assistance Bed Mobility: Sit to Supine     Supine to sit: HOB elevated Sit to supine: Mod assist   General bed mobility comments: mod assist to bring LEs in to bed.   Transfers Overall transfer level: Needs assistance Equipment used: 4-wheeled  walker Transfers: Sit to/from [employer] Sit to Stand: Min assist Stand pivot transfers: Min assist       General transfer comment: increased time and effort. Cues for hand placement and brakes.   Ambulation/Gait Ambulation/Gait assistance: Min assist;+2 safety/equipment Gait Distance (Feet): 20 Feet(10 feet x 2) Assistive device: 4-wheeled walker Gait Pattern/deviations: Step-through pattern;Decreased stride length;Wide base of support;Trunk flexed;Antalgic   Gait velocity interpretation: <1.31 ft/sec, indicative of household ambulator General Gait Details: Pt reports she needed to use bathroom,  ambulated pt to 3N1 and had to bring closer due to need to use it quickly.  Pt flexes way forward almost leaning on rollator and needed cues for safety with transitions.  Pt also needed cues to get close to chair an bed prior to sitting down.   Total assist to clean pt also after she urinated.   Stairs            Wheelchair Mobility    Modified Rankin (Stroke Patients Only)       Balance Overall balance assessment: Needs assistance Sitting-balance support: No upper extremity supported;Feet supported Sitting balance-Leahy Scale: Fair     Standing balance support: Bilateral upper extremity supported;During functional activity Standing balance-Leahy Scale: Poor Standing balance comment: reliant on rollator and additonal external support                             Pertinent Vitals/Pain Pain Assessment: No/denies pain    Home Living Family/patient expects to be discharged to:: Skilled nursing facility Living Arrangements: Alone Available Help at Discharge: Family;Available  PRN/intermittently(close to 24 hour care) Type of Home: House Home Access: Ramped entrance     Home Layout: One level Home Equipment: Walker - 4 wheels;Grab bars - tub/shower;Grab bars - toilet;Bedside commode;Shower seat;Wheelchair - manual Additional Comments: Family and  aid in and out but not 24/7    Prior Function Level of Independence: Needs assistance   Gait / Transfers Assistance Needed: Uses a rollator  ADL's / Homemaking Assistance Needed: Requires A in and out of shower and some dressing  Comments: Spouse recently passed unexpectedly in his sleep; reports with any activity she "huffs and puffs", sleeps in a lift chair     Hand Dominance   Dominant Hand: Right    Extremity/Trunk Assessment   Upper Extremity Assessment Upper Extremity Assessment: Defer to OT evaluation RUE Deficits / Details: old rotator cuff issues as well as old fx upper arm, shoulder flexion~ 20 degrees RUE Coordination: decreased gross motor    Lower Extremity Assessment Lower Extremity Assessment: Generalized weakness    Cervical / Trunk Assessment Cervical / Trunk Assessment: Kyphotic  Communication   Communication: No difficulties  Cognition Arousal/Alertness: Awake/alert Behavior During Therapy: Flat affect Overall Cognitive Status: Within Functional Limits for tasks assessed                                        General Comments      Exercises General Exercises - Lower Extremity Long Arc Quad: AROM;Both;15 reps;Seated   Assessment/Plan    PT Assessment Patient needs continued PT services  PT Problem List Decreased activity tolerance;Decreased balance;Decreased strength;Decreased mobility;Decreased knowledge of use of DME;Decreased safety awareness;Decreased knowledge of precautions;Cardiopulmonary status limiting activity       PT Treatment Interventions DME instruction;Gait training;Functional mobility training;Therapeutic activities;Therapeutic exercise;Balance training;Patient/family education    PT Goals (Current goals can be found in the Care Plan section)  Acute Rehab PT Goals Patient Stated Goal: rehab then home PT Goal Formulation: With patient Time For Goal Achievement: 02/26/18 Potential to Achieve Goals: Good     Frequency Min 3X/week   Barriers to discharge Decreased caregiver support      Co-evaluation               AM-PAC PT "6 Clicks" Daily Activity  Outcome Measure Difficulty turning over in bed (including adjusting bedclothes, sheets and blankets)?: Unable Difficulty moving from lying on back to sitting on the side of the bed? : Unable Difficulty sitting down on and standing up from a chair with arms (e.g., wheelchair, bedside commode, etc,.)?: A Lot Help needed moving to and from a bed to chair (including a wheelchair)?: A Lot Help needed walking in hospital room?: A Lot Help needed climbing 3-5 steps with a railing? : Total 6 Click Score: 9    End of Session Equipment Utilized During Treatment: Gait belt Activity Tolerance: Patient limited by fatigue Patient left: in chair;with call bell/phone within reach;with family/visitor present Nurse Communication: Mobility status PT Visit Diagnosis: Unsteadiness on feet (R26.81);Muscle weakness (generalized) (M62.81)    Time: 6384-6659 PT Time Calculation (min) (ACUTE ONLY): 37 min   Charges:   PT Evaluation $PT Eval Moderate Complexity: 1 Mod PT Treatments $Gait Training: 8-22 mins        [place] Pager:  [phone]  Office:  [place] 02/12/2018, 12:24 PM

## 2018-02-12 NOTE — Progress Notes (Signed)
  Echocardiogram 2D Echocardiogram has been performed.  Morgan [name] 02/12/2018, 11:21 AM
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
## 2018-02-12 NOTE — Care Management Note (Signed)
Case Management Note  Patient Details  Name: Morgan [name] MRN: [id number] Date of Birth: 07/19/1940  Subjective/Objective:    Depression, CHF               Action/Plan: Patient lives at home, noted recent death of spouse; PCP [name]., MD; has private insurance with Medicare; has a caregiver a few days a week; noted Physical Therapy recommends SNF Placement; CM will continue to follow for progression of care.  Expected Discharge Date:   possibly 02/16/2018               Expected Discharge Plan:  [place]     Discharge planning Services  CM Consult  Status of Service:  In process, will continue to follow  Sherrilyn Rist [phone] 02/12/2018, 10:09 AM

## 2018-02-13 ENCOUNTER — Ambulatory Visit: Payer: Medicare Other | Admitting: Nurse Practitioner

## 2018-02-13 ENCOUNTER — Other Ambulatory Visit: Payer: Medicare Other

## 2018-02-13 DIAGNOSIS — N184 Chronic kidney disease, stage 4 (severe): Secondary | ICD-10-CM

## 2018-02-13 DIAGNOSIS — F329 Major depressive disorder, single episode, unspecified: Secondary | ICD-10-CM

## 2018-02-13 LAB — CBC
HEMATOCRIT: 30.4 % — AB (ref 36.0–46.0)
Hemoglobin: 9.6 g/dL — ABNORMAL LOW (ref 12.0–15.0)
MCH: 29.2 pg (ref 26.0–34.0)
MCHC: 31.6 g/dL (ref 30.0–36.0)
MCV: 92.4 fL (ref 80.0–100.0)
PLATELETS: 169 10*3/uL (ref 150–400)
RBC: 3.29 MIL/uL — ABNORMAL LOW (ref 3.87–5.11)
RDW: 14.1 % (ref 11.5–15.5)
WBC: 6.6 10*3/uL (ref 4.0–10.5)
nRBC: 0 % (ref 0.0–0.2)

## 2018-02-13 LAB — BASIC METABOLIC PANEL
Anion gap: 8 (ref 5–15)
BUN: 43 mg/dL — ABNORMAL HIGH (ref 8–23)
CALCIUM: 8.9 mg/dL (ref 8.9–10.3)
CO2: 31 mmol/L (ref 22–32)
Chloride: 97 mmol/L — ABNORMAL LOW (ref 98–111)
Creatinine, Ser: 2.07 mg/dL — ABNORMAL HIGH (ref 0.44–1.00)
GFR, EST AFRICAN AMERICAN: 25 mL/min — AB (ref >60)
GFR, EST NON AFRICAN AMERICAN: 22 mL/min — AB (ref >60)
Glucose, Bld: 182 mg/dL — ABNORMAL HIGH (ref 70–99)
Potassium: 4.1 mmol/L (ref 3.5–5.1)
SODIUM: 136 mmol/L (ref 135–145)

## 2018-02-13 LAB — MAGNESIUM: Magnesium: 2 mg/dL (ref 1.7–2.4)

## 2018-02-13 LAB — GLUCOSE, CAPILLARY
GLUCOSE-CAPILLARY: 85 mg/dL (ref 70–99)
Glucose-Capillary: 211 mg/dL — ABNORMAL HIGH (ref 70–99)
Glucose-Capillary: 299 mg/dL — ABNORMAL HIGH (ref 70–99)
Glucose-Capillary: 73 mg/dL (ref 70–99)

## 2018-02-13 LAB — T3, FREE: T3 FREE: 1.2 pg/mL — AB (ref 2.0–4.4)

## 2018-02-13 NOTE — Clinical Social Work Placement (Signed)
   CLINICAL SOCIAL WORK PLACEMENT  NOTE  Date:  02/13/2018  Patient Details  Name: Morgan [name] MRN: [id number] Date of Birth: [birth year]  Clinical Social Work is seeking post-discharge placement for this patient at the [place] level of care (*CSW will initial, date and re-position this form in  chart as items are completed):  Yes   Patient/family provided with [facility] Work Department's list of facilities offering this level of care within the geographic area requested by the patient (or if unable, by the patient's family).  Yes   Patient/family informed of their freedom to choose among providers that offer the needed level of care, that participate in Medicare, Medicaid or managed care program needed by the patient, have an available bed and are willing to accept the patient.  Yes   Patient/family informed of [employer]'s ownership interest in [facility] and [facility], as well as of the fact that they are under no obligation to receive care at these facilities.  PASRR submitted to EDS on 02/13/18     PASRR number received on       Existing PASRR number confirmed on 02/13/18     FL2 transmitted to all facilities in geographic area requested by pt/family on 02/13/18     FL2 transmitted to all facilities within larger geographic area on       Patient informed that his/her managed care company has contracts with or will negotiate with certain facilities, including the following:            Patient/family informed of bed offers received.  Patient chooses bed at       Physician recommends and patient chooses bed at      Patient to be transferred to   on  .  Patient to be transferred to facility by       Patient family notified on   of transfer.  Name of family member notified:        PHYSICIAN Please sign FL2     Additional Comment:    _______________________________________________ Candie Chroman, LCSW 02/13/2018,  10:18 AM

## 2018-02-13 NOTE — Progress Notes (Signed)
Patient resting comfortably during shift report. Denies complaints.  

## 2018-02-13 NOTE — Progress Notes (Signed)
   Subjective:  Patient states her SOB has improved, and she is continuing to urinate. We discussed working on moving from bed to chair more, and she is agreeable to this. She was tearful on our visit, but states she is sad to go to SNF but understands it its important to help increase her strength.   Objective:  Vital signs in last 24 hours: Vitals:   02/13/18 0007 02/13/18 0622 02/13/18 0626 02/13/18 0728  BP: (!) 159/64  (!) 162/89   Pulse: 60  (!) 59   Resp:      Temp:   98 F (36.7 C)   TempSrc:      SpO2: 96%  97% 97%  Weight:  106.2 kg    Height:        Physical Exam:   Constitution: NAD, lying supine in bed  Cardio: RRR, no m/r/g Respiratory: course breath sounds, non labored breathing GU: purewick in place Neuro: a&o, cooperative, normal affect, tearful Skin: +1 pitting edema, +edema hips, otherwise c/d/i    Assessment/Plan:  Principal Problem:   Acute on chronic heart failure (HCC) Active Problems:   Major depressive disorder, single episode   Type 2 diabetes mellitus with polyneuropathy (HCC)   Essential hypertension   CKD (chronic kidney disease) stage 4, GFR 15-29 ml/min (HCC)   Acute kidney injury ([place])   Acute on Chronic Diastolic Heart Failure Hypertension Echo yesterday showing grade 2 diastolic dysfunction similar to previous echo. Blood pressure beginning to improve with diuresis. SOB improved. Weight decreased from 238 to 234.   - cont. PT/OT  - cont. lasix 40 mg bid with goal weight around 225 - am BMP, Mg - cont. Metop, clonidine patch   MDD Mood improved today although she is tearful as she wants to go home but agrees that SNF will be good for rehab   - cont. Home medications for depression and anxiety  - will begin grief counseling per daughter once discharged to SNF  AKI in CKD Cr improving with diuresis   - am BMP  TIIDM Glucose continues to be elevated on home regimen  A. Fluttter: cont. amio  VTE: lovenox IVF:  none Diet: heart healthy/carb modified Code: DNR  Dispo: Anticipated discharge in approximately 3-4 days.    Molli Hazard A, DO 02/13/2018, 7:41 AM Pager: [phone]

## 2018-02-13 NOTE — NC FL2 (Signed)
[place] LEVEL OF CARE SCREENING TOOL     IDENTIFICATION  Patient Name: Morgan [name] Birthdate: 18-Apr-1940 Sex: female Admission Date (Current Location): 02/09/2018  [facility] and [place] Number:  Herbalist and Address:  [facility] [place], [place] [address] [place], [place] [address]      Provider Number: [id number]  Attending Physician Name and Address:  Axel Filler, *  Relative Name and Phone Number:       Current Level of Care: Hospital Recommended Level of Care: [place] Prior Approval Number:    Date Approved/Denied:   PASRR Number: [id number]  Discharge Plan: SNF    Current Diagnoses: Patient Active Problem List   Diagnosis Date Noted  . Acute kidney injury ([place]) 02/12/2018  . Acute on chronic heart failure ([place]) 02/11/2018  . Abnormality of gait 08/23/2016  . Transaminitis 05/22/2016  . CKD (chronic kidney disease) stage 4, GFR 15-29 ml/min (HCC) 05/22/2016  . Parkinsonism ([place]) 02/24/2016  . Seizure disorder ([place]) 04/27/2015  . Atrial flutter ([place]) 03/15/2015  . Mitral regurgitation 03/15/2015  . Cognitive impairment 07/23/2014  . Peripheral neuropathy with Parkisonian features   . Essential hypertension   . Gastroesophageal reflux disease without esophagitis   . History of Meningioma    . Primary gout   . Left hemiparesis ([place])   . 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 Acquired autoimmune hypothyroidism   . Abnormal liver function tests   . Sleep apnea, obstructive   . Major depressive disorder, single episode   . Type 2 diabetes mellitus with polyneuropathy ([place])   . COPD (chronic obstructive pulmonary disease) ([place])   . Goiter   . Mixed hyperlipidemia 08/01/2010  . Obesity 08/01/2010    Orientation RESPIRATION BLADDER Height & Weight     Self, Time, Situation, Place  Normal Incontinent Weight: 234 lb 2.1 oz (106.2 kg) Height:  5\' 4"  (162.6 cm)  BEHAVIORAL  SYMPTOMS/MOOD NEUROLOGICAL BOWEL NUTRITION STATUS  Other (Comment)(Depressed: Husband passed away two weeks ago.) Convulsions/Seizures(Seizure disorder, Parkinsons.) Continent Diet(Heart healthy/carb modified.)  AMBULATORY STATUS COMMUNICATION OF NEEDS Skin   Limited Assist Verbally Other (Comment)(MASD.)                       Personal Care Assistance Level of Assistance  Bathing, Feeding, Dressing Bathing Assistance: Limited assistance Feeding assistance: Independent Dressing Assistance: Limited assistance     Functional Limitations Info  Sight, Hearing, Speech Sight Info: Adequate Hearing Info: Adequate Speech Info: Adequate    SPECIAL CARE FACTORS FREQUENCY  PT (By licensed PT), Blood pressure, OT (By licensed OT)     PT Frequency: 5 x week OT Frequency: 5 x week            Contractures Contractures Info: Not present    Additional Factors Info  Code Status, Allergies, Psychotropic Code Status Info: DNR   Allergies Info: Latex, Wellbutrin (Bupropion), Keppra (Levetiracetam), Statins, Byetta 10 Mcg Pen (Exenatide), Metformin, Penicillins, Sulfa Antibiotics.  Psychotropic Info: Depression. Husband passed away two weeks ago. Was having suicidal ideation but not occurring anymore and the psychiatrist cleared her on 11/4. Abilify 5 mg PO QPM, Cymbalta DR 60 mg PO BID, Lamictal 100 mg PO BID, Xanax 0.5 mg every 8 hours prn.         Current Medications (02/13/2018):  This is the current hospital active medication list Current Facility-Administered Medications  Medication Dose Route Frequency Provider Last Rate Last Dose  . acetaminophen (TYLENOL)  tablet 650 mg  650 mg Oral J1H PRN Delora Fuel, MD   417 mg at 02/12/18 1549  . albuterol (PROVENTIL) (2.5 MG/3ML) 0.083% nebulizer solution 2.5 mg  2.5 mg Nebulization Q2H PRN Virgel Manifold, MD      . ALPRAZolam Duanne Moron) tablet 0.5 mg  0.5 mg Oral E0C PRN Delora Fuel, MD   0.5 mg at 02/10/18 2137  . alum & mag  hydroxide-simeth (MAALOX/MYLANTA) 200-200-20 MG/5ML suspension 30 mL  30 mL Oral Q6H PRN Virgel Manifold, MD      . amiodarone (PACERONE) tablet 100 mg  100 mg Oral Daily Virgel Manifold, MD   100 mg at 02/12/18 0942  . ARIPiprazole (ABILIFY) tablet 5 mg  5 mg Oral QPM Delora Fuel, MD   5 mg at 02/12/18 1645  . aspirin EC tablet 81 mg  81 mg Oral Daily Virgel Manifold, MD   81 mg at 02/12/18 1448  . calcium-vitamin D (OSCAL WITH D) 500-200 MG-UNIT per tablet 1 tablet  1 tablet Oral Daily Delora Fuel, MD   1 tablet at 02/12/18 0941  . [START ON 02/15/2018] cloNIDine (CATAPRES - Dosed in mg/24 hr) patch 0.3 mg  0.3 mg Transdermal Q Mel Almond, MD      . DULoxetine (CYMBALTA) DR capsule 60 mg  60 mg Oral BID Delora Fuel, MD   60 mg at 02/12/18 2122  . enoxaparin (LOVENOX) injection 30 mg  30 mg Subcutaneous QHS Lorella Nimrod, MD   30 mg at 02/12/18 2122  . furosemide (LASIX) injection 40 mg  40 mg Intravenous BID Jean Rosenthal, MD   40 mg at 02/13/18 0900  . insulin regular human CONCENTRATED (HUMULIN R) 500 UNIT/ML kwikpen 20-50 Units  20-50 Units Subcutaneous Q supper Seawell, Jaimie A, DO   30 Units at 02/12/18 1646  . insulin regular human CONCENTRATED (HUMULIN R) 500 UNIT/ML kwikpen 40-70 Units  40-70 Units Subcutaneous Q lunch Seawell, Jaimie A, DO      . insulin regular human CONCENTRATED (HUMULIN R) 500 UNIT/ML kwikpen 45-75 Units  45-75 Units Subcutaneous Q breakfast Seawell, Jaimie A, DO   50 Units at 02/13/18 0900  . lamoTRIgine (LAMICTAL) tablet 100 mg  100 mg Oral BID Virgel Manifold, MD   100 mg at 02/12/18 2122  . levothyroxine (SYNTHROID, LEVOTHROID) tablet 200 mcg  200 mcg Oral QAC breakfast Virgel Manifold, MD   200 mcg at 02/13/18 [phone]  . liraglutide (VICTOZA) SOPN 1.8 mg  1.8 mg Subcutaneous Daily Virgel Manifold, MD   1.8 mg at 02/12/18 1012  . lubiprostone (AMITIZA) capsule 8 mcg  8 mcg Oral BID WC Virgel Manifold, MD   8 mcg at 02/13/18 0900  . metoprolol tartrate (LOPRESSOR)  tablet 50 mg  50 mg Oral BID Virgel Manifold, MD   50 mg at 02/12/18 2131  . mometasone-formoterol (DULERA) 100-5 MCG/ACT inhaler 2 puff  2 puff Inhalation BID Virgel Manifold, MD   2 puff at 02/13/18 0726  . multivitamin with minerals tablet 1 tablet  1 tablet Oral Daily Delora Fuel, MD   1 tablet at 02/12/18 [phone]  . ondansetron (ZOFRAN) tablet 4 mg  4 mg Oral F0Y PRN Delora Fuel, MD   4 mg at 02/10/18 2137  . pantoprazole (PROTONIX) EC tablet 40 mg  40 mg Oral BID Virgel Manifold, MD   40 mg at 02/12/18 2122  . polyethylene glycol (MIRALAX / GLYCOLAX) packet 17 g  17 g Oral Daily PRN Lorella Nimrod, MD      .  senna (SENOKOT) tablet 17.2 mg  2 tablet Oral QHS Virgel Manifold, MD   17.2 mg at 02/12/18 2122  . traMADol (ULTRAM) tablet 50 mg  50 mg Oral Q12H PRN Virgel Manifold, MD   50 mg at 02/11/18 1507  . vitamin B-12 (CYANOCOBALAMIN) tablet 1,000 mcg  1,000 mcg Oral Daily Delora Fuel, MD   1,483 mcg at 02/12/18 0735     Discharge Medications: Please see discharge summary for a list of discharge medications.  Relevant Imaging Results:  Relevant Lab Results:   Additional Information SS#: [id number]  Candie Chroman, LCSW

## 2018-02-13 NOTE — Clinical Social Work Note (Addendum)
[facility] has declined making a bed offer since suicidal ideation is so recent. [facility] has not reviewed referral yet.  Dayton Scrape, [place] [phone]  2:03 pm [facility] will not have a bed available until Tuesday or Wednesday of next week but will call CSW if anything opens up before then. Sent referral to other facilities close to patient's home.  Dayton Scrape, [place]

## 2018-02-13 NOTE — Progress Notes (Signed)
Internal Medicine Attending:   I saw and examined the patient. I reviewed the resident's note and I agree with the resident's findings and plan as documented in the resident's note.  Principal Problem:   Acute on chronic heart failure (HCC) Active Problems:   Major depressive disorder, single episode   Type 2 diabetes mellitus with polyneuropathy (HCC)   Essential hypertension   CKD (chronic kidney disease) stage 4, GFR 15-29 ml/min (HCC)   Acute kidney injury ([place])  Diuresing well on current dosing of IV lasix. Will continue for another 3-4 days with goal weight around 225lbs. Depression/grief is stable with low suicide risk.   Lalla Brothers, MD

## 2018-02-13 NOTE — Plan of Care (Signed)
  Problem: Elimination: Goal: Will not experience complications related to bowel motility Outcome: Progressing   Problem: Safety: Goal: Ability to remain free from injury will improve Outcome: Progressing   

## 2018-02-13 NOTE — Clinical Social Work Note (Signed)
Clinical Social Work Assessment  Patient Details  Name: Morgan [name] MRN: [id number] Date of Birth: April 20, 1940  Date of referral:  02/13/18               Reason for consult:  Facility Placement, Discharge Planning                Permission sought to share information with:  Facility Sport and exercise psychologist, Family Supports Permission granted to share information::  Yes, Verbal Permission Granted  Name::     [place]::  SNF's  Relationship::  Daughter  Contact Information:  [phone]  Housing/Transportation Living arrangements for the past 2 months:  Single Family Home Source of Information:  Patient, Medical Team, Adult Children Patient Interpreter Needed:  None Criminal Activity/Legal Involvement Pertinent to Current Situation/Hospitalization:  No - Comment as needed Significant Relationships:  Adult Children Lives with:  Self(Husband passed away two weeks ago.) Do you feel safe going back to the place where you live?  Yes Need for family participation in patient care:  Yes (Comment)  Care giving concerns:  PT recommending SNF once medically stable for discharge.   Social Worker assessment / plan:  CSW met with patient. Daughter at bedside. CSW introduced role and explained that PT recommendations would be discussed. Patient and her daughter are agreeable to SNF placement. First preference is [facility] and second preference is Sports [facility]. Patient's daughter stated she has been to both of these facilities over the past couple of years. Left voicemail for [facility] admissions coordinator. Per MD, patient will likely be stable for discharge Friday or Saturday. No further concerns. CSW encouraged patient and her daughter to contact CSW as needed. CSW will continue to follow patient and her daughter for support and facilitate discharge to SNF once medically stable.  Employment status:  Retired Forensic scientist:  Medicare PT Recommendations:  [place] / Referral to community resources:  [place]  Patient/Family's Response to care:  Patient and her daughter agreeable to SNF placement. Patient's children supportive and involved in patient's care. Patient and her daughter appreciated social work intervention.  Patient/Family's Understanding of and Emotional Response to Diagnosis, Current Treatment, and Prognosis:  Patient and her daughter have a good understanding of the reason for admission and her need for rehab prior to returning home. Patient and her daughter appear pleased with hospital care. Patient tearful and experiencing grief from her husband's recent passing.  Emotional Assessment Appearance:  Appears stated age Attitude/Demeanor/Rapport:  Grieving Affect (typically observed):  Depressed, Accepting, Appropriate, Tearful/Crying Orientation:  Oriented to Self, Oriented to Place, Oriented to  Time, Oriented to Situation Alcohol / Substance use:  Never Used Psych involvement (Current and /or in the community):  Yes (Comment), Outpatient Provider(Cleared by psychiatrist on 11/4.)  Discharge Needs  Concerns to be addressed:  Care Coordination Readmission within the last 30 days:  No Current discharge risk:  Dependent with Mobility, Lives alone, Other(Recent suicidal ideation.) Barriers to Discharge:  Continued Medical Work up   Candie Chroman, LCSW 02/13/2018, 10:07 AM

## 2018-02-14 DIAGNOSIS — K5900 Constipation, unspecified: Secondary | ICD-10-CM

## 2018-02-14 LAB — MAGNESIUM: MAGNESIUM: 2 mg/dL (ref 1.7–2.4)

## 2018-02-14 LAB — GLUCOSE, CAPILLARY
GLUCOSE-CAPILLARY: 173 mg/dL — AB (ref 70–99)
GLUCOSE-CAPILLARY: 166 mg/dL — AB (ref 70–99)
Glucose-Capillary: 346 mg/dL — ABNORMAL HIGH (ref 70–99)
Glucose-Capillary: 98 mg/dL (ref 70–99)

## 2018-02-14 LAB — BASIC METABOLIC PANEL
Anion gap: 10 (ref 5–15)
BUN: 37 mg/dL — AB (ref 8–23)
CHLORIDE: 97 mmol/L — AB (ref 98–111)
CO2: 28 mmol/L (ref 22–32)
CREATININE: 1.97 mg/dL — AB (ref 0.44–1.00)
Calcium: 8.9 mg/dL (ref 8.9–10.3)
GFR calc non Af Amer: 23 mL/min — ABNORMAL LOW (ref >60)
GFR, EST AFRICAN AMERICAN: 27 mL/min — AB (ref >60)
Glucose, Bld: 137 mg/dL — ABNORMAL HIGH (ref 70–99)
POTASSIUM: 4 mmol/L (ref 3.5–5.1)
Sodium: 135 mmol/L (ref 135–145)

## 2018-02-14 MED ORDER — POLYETHYLENE GLYCOL 3350 17 G PO PACK
17.0000 g | PACK | Freq: Once | ORAL | Status: AC
  Administered 2018-02-14: 17 g via ORAL
  Filled 2018-02-14: qty 1

## 2018-02-14 MED ORDER — FUROSEMIDE 10 MG/ML IJ SOLN
60.0000 mg | Freq: Two times a day (BID) | INTRAMUSCULAR | Status: DC
  Administered 2018-02-14 (×2): 60 mg via INTRAVENOUS
  Filled 2018-02-14 (×2): qty 6

## 2018-02-14 NOTE — Plan of Care (Signed)
  Problem: Education: Goal: Knowledge of General Education information will improve Description Including pain rating scale, medication(s)/side effects and non-pharmacologic comfort measures Outcome: Progressing   Problem: Health Behavior/Discharge Planning: Goal: Ability to manage health-related needs will improve Outcome: Progressing   

## 2018-02-14 NOTE — Progress Notes (Signed)
Physical Therapy Treatment Patient Details Name: Morgan [name] MRN: [id number] DOB: May 21, 1940 Today's Date: 02/14/2018    History of Present Illness Ms. Holsworth is a 77 year old woman with chronic diastolic heart failure EF 65-70%, atrial flutter, arthrosclerotic CAD, CKD III/IV, pulmonary HTN, obstructive sleep apnea, DM2, hypothyroidism, nonalcoholic steatohepatitis, seizure disorder, emphysema, major depressive disorder, meningioma s/p right frontal craniotomy, breast cancer s/p left mastectomy and abnormal gait presenting for evaluation of SOB and anasarca.  Of note, pt recent psych admit due to suicidal ideation.     PT Comments    Pt admitted with above diagnosis. Pt currently with functional limitations due to the deficits listed below (see PT Problem List). Pt was able to ambulate with rollator in room with steadier gait than last treatment.  Incr distance but needs continuous cuing and steadying assist.  Progressing slowly.   Pt will benefit from skilled PT to increase their independence and safety with mobility to allow discharge to the venue listed below.     Follow Up Recommendations  SNF;Supervision/Assistance - 24 hour     Equipment Recommendations  None recommended by PT    Recommendations for Other Services       Precautions / Restrictions Precautions Precautions: Fall Precaution Comments: 12 falls in last year per dtr Restrictions Weight Bearing Restrictions: No    Mobility  Bed Mobility               General bed mobility comments: Pt in chair on arrival  Transfers Overall transfer level: Needs assistance Equipment used: 4-wheeled walker Transfers: Sit to/from [employer] Transfers Sit to Stand: Min assist Stand pivot transfers: Min assist       General transfer comment: increased time and effort. Cues for hand placement and brakes.   Ambulation/Gait Ambulation/Gait assistance: Min assist;+2 safety/equipment Gait Distance (Feet): 70  Feet(40 feet then 30 feet) Assistive device: 4-wheeled walker Gait Pattern/deviations: Step-through pattern;Decreased stride length;Wide base of support;Trunk flexed;Antalgic   Gait velocity interpretation: <1.31 ft/sec, indicative of household ambulator General Gait Details: Pt  flexes way forward leaning on rollator and needed cues for safety with transitions.  Pt did attempt to stand taller today with constant cues.  Pt also needed cues to get closer to rollator when walking.Pt followed with the chair so she took a seated rest break.    Stairs             Wheelchair Mobility    Modified Rankin (Stroke Patients Only)       Balance Overall balance assessment: Needs assistance Sitting-balance support: No upper extremity supported;Feet supported Sitting balance-Leahy Scale: Fair     Standing balance support: Bilateral upper extremity supported;During functional activity Standing balance-Leahy Scale: Poor Standing balance comment: reliant on rollator and additonal external support                            Cognition Arousal/Alertness: Awake/alert Behavior During Therapy: Flat affect Overall Cognitive Status: Within Functional Limits for tasks assessed                                        Exercises General Exercises - Lower Extremity Ankle Circles/Pumps: AROM;Both;10 reps;Supine Quad Sets: AROM;Both;10 reps;Supine Long Arc Quad: AROM;Both;15 reps;Seated    General Comments        Pertinent Vitals/Pain Pain Assessment: No/denies pain    Home Living  Prior Function            PT Goals (current goals can now be found in the care plan section) Acute Rehab PT Goals Patient Stated Goal: rehab then home Progress towards PT goals: Progressing toward goals    Frequency    Min 3X/week      PT Plan Current plan remains appropriate    Co-evaluation              AM-PAC PT "6 Clicks" Daily  Activity  Outcome Measure  Difficulty turning over in bed (including adjusting bedclothes, sheets and blankets)?: Unable Difficulty moving from lying on back to sitting on the side of the bed? : Unable Difficulty sitting down on and standing up from a chair with arms (e.g., wheelchair, bedside commode, etc,.)?: A Lot Help needed moving to and from a bed to chair (including a wheelchair)?: A Lot Help needed walking in hospital room?: A Lot Help needed climbing 3-5 steps with a railing? : Total 6 Click Score: 9    End of Session Equipment Utilized During Treatment: Gait belt Activity Tolerance: Patient limited by fatigue Patient left: in chair;with call bell/phone within reach;with family/visitor present Nurse Communication: Mobility status PT Visit Diagnosis: Unsteadiness on feet (R26.81);Muscle weakness (generalized) (M62.81)     Time: 2957-4734 PT Time Calculation (min) (ACUTE ONLY): 21 min  Charges:  $Gait Training: 8-22 mins                     [place] Pager:  [phone]  Office:  [place] 02/14/2018, 11:37 AM

## 2018-02-14 NOTE — Clinical Social Work Note (Signed)
CSW met with patient's son and provided update on [facility] and [facility] from yesterday. CSW notified him that the only other bed offer so far is [facility] in [place].  Dayton Scrape, [place]

## 2018-02-14 NOTE — Progress Notes (Signed)
Internal Medicine Attending:   I saw and examined the patient. I reviewed the resident's note and I agree with the resident's findings and plan as documented in the resident's note.   Principal Problem:   Acute on chronic heart failure (HCC) Active Problems:   Major depressive disorder, single episode   Type 2 diabetes mellitus with polyneuropathy (HCC)   Essential hypertension   CKD (chronic kidney disease) stage 4, GFR 15-29 ml/min (HCC)   Acute kidney injury ([place])   Diuresis with Lasix 88m IV bid has slowed, her weight is the same today as yesterday. I agree with increasing lasix to 658mIV bid, continue to monitor potassium and mag. CKD is stable to improving with diuresis. She had no out of bed activity yesterday, I again encouraged her to be in the chair today, she is high risk for deconditioning if she does not get more active. Greatly appreciate CSW and care manager in arranging for subacute rehab. We updated her son at the bedside today.   DuLalla BrothersMD

## 2018-02-14 NOTE — Progress Notes (Signed)
   Subjective:  She is still having SOB that hasn't changed from yesterday. Weight has not changed as well. She did not get out of bed yesterday, and we discussed that she needed to try and take all her meals in her chair today as she will continue to weaken otherwise. She does not appear as down as yesterday and is not tearful but is tired from having to stand up to be weighed.    Objective:  Vital signs in last 24 hours: Vitals:   02/13/18 1916 02/13/18 1928 02/14/18 0532 02/14/18 0657  BP: (!) 141/56  (!) 183/69   Pulse: (!) 57 (!) 55 (!) 58   Resp: 18 18 18    Temp: 98.6 F (37 C)  98 F (36.7 C)   TempSrc: Oral  Oral   SpO2: 94% 94% 98%   Weight:   106.4 kg 105.8 kg  Height:       Physical Exam Constitution: NAD, appears stated age, obese Cardio: RRR, no m/r/g Respiratory: course breath sounds, non-labored breathing, no rales or rhonchi GU: pure wick in place Neuro: a&o, cooperative, normal affect Skin: +1 pitting edema, otherwise c/d/i    Assessment/Plan:  Principal Problem:   Acute on chronic heart failure (HCC) Active Problems:   Major depressive disorder, single episode   Type 2 diabetes mellitus with polyneuropathy (HCC)   Essential hypertension   CKD (chronic kidney disease) stage 4, GFR 15-29 ml/min (HCC)   Acute kidney injury ([place])  Acute on Chronic Diastolic Heart Failure Hypertension Weights unchanged from yesterday. Purewick came out this morning and output may be inaccurate. Her edema does appear improved. She continues to have SOB and will benefit from increase in her lasix. She also   - increase lasix to 60mg  bid - cont. PT/OT - am BMP, Mg - cont. Metoprolol, clonidine patch  - daily weights, I/O's  AKI on CKD: kidney function continuing to improve  - am BMP   MDD Mood continues to be improved from admission. Will cont. To monitor.  - cont. Home medications - abilify, xanax .5 q8h, cymbalta 60 mg bid  TIIDM  Glucose levels increased during  the day with home regimen but decreased overnight to 73. It appears she ate about 40% of her meal. Her family has reported that hypoglycemia overnight has been an issue in the past, which may be due to decreased intake in the evening.   - cont. Home U500 ssi and victoza  Constipation Last BM 3 days ago. Home senokot 17.2 mg qhs continued at admission. No abdominal pain.    - continuing home senokot - miralax added  VTE: lovenox IVF: none Diet: heart healthy/card modified Code: DNR  Dispo: Anticipated discharge in approximately 2-3 days.   Molli Hazard A, DO 02/14/2018, 7:49 AM Pager: [phone]

## 2018-02-15 DIAGNOSIS — R32 Unspecified urinary incontinence: Secondary | ICD-10-CM

## 2018-02-15 DIAGNOSIS — E11649 Type 2 diabetes mellitus with hypoglycemia without coma: Secondary | ICD-10-CM

## 2018-02-15 LAB — BASIC METABOLIC PANEL
Anion gap: 9 (ref 5–15)
BUN: 41 mg/dL — AB (ref 8–23)
CHLORIDE: 96 mmol/L — AB (ref 98–111)
CO2: 31 mmol/L (ref 22–32)
CREATININE: 2.17 mg/dL — AB (ref 0.44–1.00)
Calcium: 9.3 mg/dL (ref 8.9–10.3)
GFR calc Af Amer: 24 mL/min — ABNORMAL LOW (ref >60)
GFR calc non Af Amer: 21 mL/min — ABNORMAL LOW (ref >60)
Glucose, Bld: 36 mg/dL — CL (ref 70–99)
POTASSIUM: 3.7 mmol/L (ref 3.5–5.1)
SODIUM: 136 mmol/L (ref 135–145)

## 2018-02-15 LAB — CBC
HEMATOCRIT: 32.4 % — AB (ref 36.0–46.0)
Hemoglobin: 10.1 g/dL — ABNORMAL LOW (ref 12.0–15.0)
MCH: 29 pg (ref 26.0–34.0)
MCHC: 31.2 g/dL (ref 30.0–36.0)
MCV: 93.1 fL (ref 80.0–100.0)
Platelets: 240 10*3/uL (ref 150–400)
RBC: 3.48 MIL/uL — ABNORMAL LOW (ref 3.87–5.11)
RDW: 14.5 % (ref 11.5–15.5)
WBC: 9.8 10*3/uL (ref 4.0–10.5)
nRBC: 0 % (ref 0.0–0.2)

## 2018-02-15 LAB — GLUCOSE, CAPILLARY
GLUCOSE-CAPILLARY: 167 mg/dL — AB (ref 70–99)
GLUCOSE-CAPILLARY: 98 mg/dL (ref 70–99)
GLUCOSE-CAPILLARY: 148 mg/dL — AB (ref 70–99)
GLUCOSE-CAPILLARY: 159 mg/dL — AB (ref 70–99)
Glucose-Capillary: 72 mg/dL (ref 70–99)
Glucose-Capillary: 216 mg/dL — ABNORMAL HIGH (ref 70–99)

## 2018-02-15 LAB — MAGNESIUM: Magnesium: 2.1 mg/dL (ref 1.7–2.4)

## 2018-02-15 MED ORDER — FUROSEMIDE 10 MG/ML IJ SOLN
80.0000 mg | Freq: Two times a day (BID) | INTRAMUSCULAR | Status: DC
  Administered 2018-02-15 (×2): 80 mg via INTRAVENOUS
  Filled 2018-02-15 (×2): qty 8

## 2018-02-15 NOTE — Progress Notes (Signed)
Internal Medicine Attending:   I saw and examined the patient. I reviewed the resident's note and I agree with the resident's findings and plan as documented in the resident's note.  Principal Problem:   Acute on chronic heart failure (HCC) Active Problems:   Major depressive disorder, single episode   Type 2 diabetes mellitus with polyneuropathy (HCC)   Essential hypertension   CKD (chronic kidney disease) stage 4, GFR 15-29 ml/min (HCC)   Acute kidney injury [facility])   Hospital day #5 with acute on chronic heart failure with preserved ejection fraction.  Diuresis has slowed the last 2 days, weight is 233 pounds, still about 8 pounds up from her dry weight.  I agree with the plan to increase Lasix to 80 mg IV twice daily today.  I gave her more encouragement today to mobilize out of bed, ambulate more, more time in the chair; she is clearly deconditioned from this acute illness.  Mood disorder is stable, low risk for suicidality.  Still working on placement which will likely happen early next week.  Lalla Brothers, MD

## 2018-02-15 NOTE — Progress Notes (Signed)
Patient BG is 167. Spoke to MD and suggested wait before lunch arrives , check BG again and then give the units per scale.  Spoke to Diabetes coordinator about situation.

## 2018-02-15 NOTE — Discharge Summary (Signed)
Name: Morgan [name] MRN: [id number] DOB: [birth year] 77 y.o. PCP: [name]., MD  Date of Admission: 02/09/2018  9:34 PM Date of Discharge: 02/19/2018 Attending Physician: Axel Filler, *  Discharge Diagnosis: 1. Acute on Chronic Diastolic Heart Failure 2. Major Depressive Disorder 3. AKI in the setting of CKD Stage IIIB 4. Hypertension 5. TIIDM  Discharge Medications: Allergies as of 02/19/2018      Reactions   Latex Rash   Wellbutrin [bupropion] Other (See Comments)   Unable to stand.   Keppra [levetiracetam] Other (See Comments)   Dementia.   Statins    Byetta 10 Mcg Pen [exenatide] Rash   Metformin Rash   RENAL INSUFFICIENCY-takes low dose   Penicillins Rash   Sulfa Antibiotics Other (See Comments)   Unknown allergic reaction      Medication List    TAKE these medications   acetaminophen 325 MG tablet Commonly known as:  TYLENOL Take 650 mg by mouth every 6 (six) hours as needed for mild pain.   albuterol (2.5 MG/3ML) 0.083% nebulizer solution Commonly known as:  PROVENTIL Take 3 mLs (2.5 mg total) by nebulization every 2 (two) hours as needed for shortness of breath.   ALPRAZolam 0.5 MG tablet Commonly known as:  XANAX Take 0.5 mg by mouth every 8 (eight) hours as needed for anxiety.   amiodarone 200 MG tablet Commonly known as:  PACERONE Take 0.5 tablets (100 mg total) by mouth daily. TAKE 1/2 TABLET (200 MG TOTAL) BY MOUTH DAILY. What changed:  See the new instructions.   amLODipine 10 MG tablet Commonly known as:  NORVASC Take 1 tablet (10 mg total) by mouth daily. Start taking on:  02/20/2018   ARIPiprazole 5 MG tablet Commonly known as:  ABILIFY Take 5 mg by mouth every evening.   aspirin EC 81 MG tablet Take 1 tablet (81 mg total) by mouth daily.   CALCIUM 600+D 600-800 MG-UNIT Tabs Generic drug:  Calcium Carb-Cholecalciferol Take 1 tablet by mouth daily.   cloNIDine 0.3 mg/24hr patch Commonly known as:  CATAPRES -  Dosed in mg/24 hr Place 0.3 mg onto the skin every Friday.   colchicine 0.6 MG tablet Take 0.6 mg by mouth daily as needed (for gout).   DULoxetine 60 MG capsule Commonly known as:  CYMBALTA Take 60 mg by mouth 2 (two) times daily.   furosemide 40 MG tablet Commonly known as:  LASIX Take 40 mg by mouth daily.   HUMULIN R 500 UNIT/ML injection Generic drug:  insulin regular human CONCENTRATED Inject 5-10 Units into the skin 3 (three) times daily with meals. Sliding scale:   NOTE:  Doses below are where U-100 syringe is drawn to; actually provides 5 times more insulin since using U-500 150 and below no Insulin  Breakfast  151-200 = 9 units 201-250= 10 units 251-300= 11 units 301-350=12 units 351-400= 13 units 4001-455= 14 units Above 450 = 15 units  Lunch  151-200= 8 units 201-250= 9 units 251-300= 10 units 301-350= 11 units 351-400= 12 units 401-450= 13 units Above 450 = 14 units  Supper 151-200= 4 units 201-250= 5 units 251-300= 6 units 301-350=7 units 351-400= 8 units 401-450= 9 units Above 450 = 10 units  NO INSULIN AFTER SUPPER, EVEN IF IT'S HIGH BECAUSE HER LEVELS WILL DROP DURING THE NIGHT. PLEASE GIVE A SNACK   lamoTRIgine 100 MG tablet Commonly known as:  LAMICTAL Take 100 mg by mouth 2 (two) times daily.   levothyroxine 200 MCG tablet  Commonly known as:  SYNTHROID, LEVOTHROID Take 1 tablet (200 mcg total) by mouth daily before breakfast. What changed:    when to take this  additional instructions   lubiprostone 8 MCG capsule Commonly known as:  AMITIZA Take 8 mcg by mouth 2 (two) times daily with a meal.   metoprolol tartrate 50 MG tablet Commonly known as:  LOPRESSOR Take 50 mg by mouth 2 (two) times daily.   multivitamin with minerals Tabs tablet Take 1 tablet by mouth daily.   nitroGLYCERIN 0.4 MG SL tablet Commonly known as:  NITROSTAT Place 0.4 mg under the tongue every 5 (five) minutes as needed for chest pain.   nystatin  powder Commonly known as:  MYCOSTATIN/NYSTOP Apply 1 g topically daily as needed (irritation).   pantoprazole 40 MG tablet Commonly known as:  PROTONIX Take 40 mg by mouth 2 (two) times daily.   senna 8.6 MG Tabs tablet Commonly known as:  SENOKOT Take 2 tablets by mouth at bedtime.   SYMBICORT 80-4.5 MCG/ACT inhaler Generic drug:  budesonide-formoterol Inhale 2 puffs into the lungs 2 (two) times daily.   VICTOZA 18 MG/3ML Sopn Generic drug:  liraglutide Inject 1.8 mg into the skin daily. Must take at with First dose if Inulin Must eat after   vitamin B-12 1000 MCG tablet Commonly known as:  CYANOCOBALAMIN Take 1,000 mcg by mouth daily.            Durable Medical Equipment  (From admission, onward)         Start     Ordered   02/19/18 1145  DME 3-in-1  Once     02/19/18 1146          Disposition and follow-up:   Morgan [name] was discharged from [facility] in Stable condition.  At the hospital follow up visit please address:  1.  Acute on Chronic Diastolic Heart Failure: Cr increased and lasix held for two days as she may have overdiuresed. Please hold lasix for today and restart tomorrow.  AKI in CKD Stage IIIB: admitted with AKI, baseline around 1.3 - 1.6. Creatinine 2.2 at discharge and trending toward baseline Major Depressive Disorder: found to have depression and previous but not current SI due to the death of her husband four months ago. Did not qualify for inpatient geropsych. Doing well throughout admission and grief counseling planned at discharge per daughter.  OSA: patient was not using home CPAP, which she no longer has. Started on CPAP inpatient and tolerated well with improvement in sleep and BP. She will need sleep study to obtain new CPAP HTN: BP did not come down with diuresis. Started on amlodipine 10mg  qd.  Meningioma s/p right frontal craniotomy: Stable. Repeat MRI planned prior to hospital stay and will need to be  rescheduled.  TIIDM: She is currently not taking her U500 with dinner as this was causing hypoglycemia. Only give U500 with breakfast and lunch as she became hypoglycemia overnight with U500 with supper.   2.  Labs / imaging needed at time of follow-up: BMP, glucose   3.  Pending labs/ test needing follow-up: none   Follow-up Appointments:   Hospital Course by problem list: Acute on Chronic Diastolic Heart Failure Morgan [name] is a 77 year old woman with chronic diastolic heart failure EF 65-70%, atrial flutter, OSA, CAD, CKD III/IV, pulmonary hypertension, obstructive sleep apnea, type 2 diabetes mellitus, hypothyroidism, nonalcoholic steatohepatitis, seizure disorder, emphysema, major depressive disorder, history of meningioma status post right frontal craniotomy, history  of breast cancer status post left mastectomy and abnormal gait admitted for acute on chronic diastolic heart failure, AKI and hypervolemia. She was placed on supplemental O2 at admission and diuresed with IV lasix. Baseline weight previously around 225, now about 230. She continued to be hypertensive with diuresis. It was found she has a history of OSA but does not use home CPAP. CPAP was started with improvement in sleep. In addition, norvasc 10mg  was started. She had a creatinine bump and was likely overdiuresed. Her lasix was held for two days with improvement of her creatinine. She will hold her lasix again on day of discharge and then can restart her home oral dose. She will need outpatient follow-up with her PCP.   Active Episode Major Depressive Disorder Anxiety  She lost her husband four months ago and has been feeling increasingly sad ever since, with occasional SI and thoughts that she could take all of her pills at one time. She was not having active SI at admission and did not qualify for inpatient geropsych. She has a strong support system with her son and daughter who hope to have grief counseling come to SNF to meet  with patient. We continued her anti-depressive and anti-anxiety medications at discharge. She sees psychiatry outpatient and will need follow-up after SNF.    TIIDM Patient was continued on her home victoza 1.8mg  and U500 SSI, which is done with breakfast and lunch. She has a history of evening hypoglycemia and does not take U500 in the evenings or with dinner.   Discharge Vitals:   BP (!) 142/57 (BP Location: Right Arm)   Pulse 60   Temp 98 F (36.7 C) (Oral)   Resp 18   Ht 5\' 4"  (1.626 m)   Wt 105.5 kg Comment: scale a  SpO2 94%   BMI 39.91 kg/m   Pertinent Labs, Studies, and Procedures:   Echo 11/12 Study Conclusions  - Left ventricle: The cavity size was normal. Wall thickness was   increased in a pattern of mild LVH. Systolic function was normal.   The estimated ejection fraction was in the range of 55% to 60%.   Wall motion was normal; there were no regional wall motion   abnormalities. Features are consistent with a pseudonormal left   ventricular filling pattern, with concomitant abnormal relaxation   and increased filling pressure (grade 2 diastolic dysfunction). - Aortic valve: There was trivial regurgitation. - Left atrium: The atrium was mildly dilated. - Pulmonary arteries: Systolic pressure was mildly to moderately   increased. PA peak pressure: 47 mm Hg (S).  TSH: 1.505 T4: 1.06 Free T3: 1.2   Discharge Instructions: Discharge Instructions    Diet - low sodium heart healthy   Complete by:  As directed    Discharge instructions   Complete by:  As directed    You were hospitalized for acute on chronic diastolic heart failure. Thank you for allowing [place] to be part of your care.  Your baseline weight is around 330 lbs currently. Please weigh yourself daily in the morning. If you notice that you began to gain weight more than 5 lbs in one day or began to feel short of breath, please take an extra dose of lasix and contact your primary care physician.     Please note these changes made to your medications:   Please do not take your lasix today, but restart you lasix (furosemide) 40 mg one tablet per day again tomorrow.   Increase activity slowly  Complete by:  As directed       Signed: Molli Hazard A, DO 02/19/2018, 12:06 PM   Pager: [phone]

## 2018-02-15 NOTE — Care Management Important Message (Signed)
Important Message  Patient Details  Name: LISSETH BRAZEAU MRN: [id number] Date of Birth: 05-29-1940   Medicare Important Message Given:  Yes    Loriel Diehl P Clarkson Rosselli 02/15/2018, 3:44 PM

## 2018-02-15 NOTE — Progress Notes (Signed)
Occupational Therapy Treatment Patient Details Name: Morgan [name] MRN: [id number] DOB: 1941-02-14 Today's Date: 02/15/2018    History of present illness [name] is a 77 year old woman with chronic diastolic heart failure EF 65-70%, atrial flutter, arthrosclerotic CAD, CKD III/IV, pulmonary HTN, obstructive sleep apnea, DM2, hypothyroidism, nonalcoholic steatohepatitis, seizure disorder, emphysema, major depressive disorder, meningioma s/p right frontal craniotomy, breast cancer s/p left mastectomy and abnormal gait presenting for evaluation of SOB and anasarca.  Of note, pt recent psych admit due to suicidal ideation.    OT comments  Pt. States she has no energy but agreeable to "give it a try".  Max a for bed mobility.  Mod a for squat pivot to L to recliner.  Low energy but motivated.  Requires cues to rest and "catch her breath.  Will continue with current POC with focus on increasing safety and independence with ADLs.    Follow Up Recommendations  SNF;Supervision/Assistance - 24 hour    Equipment Recommendations  3 in 1 bedside commode    Recommendations for Other Services      Precautions / Restrictions Precautions Precautions: Fall Precaution Comments: 12 falls in last year per dtr       Mobility Bed Mobility Overal bed mobility: Needs Assistance Bed Mobility: Supine to Sit     Supine to sit: HOB elevated;Max assist     General bed mobility comments: required assistance to transition into sitting and also to scoot hips to eob -dtr. who was present reports torn R rotator cuff and unable to assist with scooting or pulling with RUE  Transfers Overall transfer level: Needs assistance Equipment used: None Transfers: Sit to/from [employer] Sit to Stand: Min assist;From elevated surface Stand pivot transfers: Mod assist;From elevated surface       General transfer comment: increased time and effort. dtr. reports pt. has a lift recliner at home.     Balance                                           ADL either performed or assessed with clinical judgement   ADL Overall ADL's : Needs assistance/impaired                     Lower Body Dressing: Maximal assistance;Sitting/lateral leans   Toilet Transfer: Moderate assistance;Squat-pivot Toilet Transfer Details (indicate cue type and reason): simulated from eob to recliner Toileting- Clothing Manipulation and Hygiene: Total assistance Toileting - Clothing Manipulation Details (indicate cue type and reason): simulated during transfer     Functional mobility during ADLs: Moderate assistance General ADL Comments: required cues for breathing strategies and resting. pt. eager to "just get on with it" would benefit from cont. energy conservation education     Vision       Perception     Praxis      Cognition Arousal/Alertness: Awake/alert Behavior During Therapy: WFL for tasks assessed/performed Overall Cognitive Status: Within Functional Limits for tasks assessed                                          Exercises     Shoulder Instructions       General Comments      Pertinent Vitals/ Pain       Pain Assessment: No/denies pain  Home Living                                          Prior Functioning/Environment              Frequency  Min 2X/week        Progress Toward Goals  OT Goals(current goals can now be found in the care plan section)  Progress towards OT goals: Progressing toward goals     Plan Discharge plan remains appropriate    Co-evaluation                 AM-PAC PT "6 Clicks" Daily Activity     Outcome Measure   Help from another person eating meals?: None Help from another person taking care of personal grooming?: A Little Help from another person toileting, which includes using toliet, bedpan, or urinal?: A Lot Help from another person bathing (including washing,  rinsing, drying)?: A Lot Help from another person to put on and taking off regular upper body clothing?: A Little Help from another person to put on and taking off regular lower body clothing?: A Lot 6 Click Score: 16    End of Session Equipment Utilized During Treatment: Gait belt  OT Visit Diagnosis: Unsteadiness on feet (R26.81);Repeated falls (R29.6);History of falling (Z91.81);Muscle weakness (generalized) (M62.81);Adult, failure to thrive (R62.7)   Activity Tolerance Patient tolerated treatment well   Patient Left in chair;with call bell/phone within reach   Nurse Communication Other (comment)(RN states okay to work with pt. was present at end of session. provided rec. for 2 person transfer back to bed and rec. moving chair so pt. could return to L and utilize LUE during transfer. )        Time: 2947-6546 OT Time Calculation (min): 28 min  Charges: OT General Charges $OT Visit: 1 Visit OT Treatments $Self Care/Home Management : 23-37 mins   Janice Coffin, COTA/L 02/15/2018, 1:44 PM

## 2018-02-15 NOTE — Progress Notes (Addendum)
   Subjective:  No SOB. Moved from bed to chair yesterday, and we encouraged further movement and getting out of bed. She states she will try.  She had an episode of hypoglycemia overnight and states she felt shaky this morning with symptoms improving with graham crackers.    Objective:  Vital signs in last 24 hours: Vitals:   02/14/18 2239 02/15/18 0300 02/15/18 0411 02/15/18 0801  BP: (!) 135/51  (!) 180/75   Pulse: (!) 59  (!) 56   Resp:   18   Temp:   98 F (36.7 C)   TempSrc:   Oral   SpO2:   96% 96%  Weight:  106.1 kg    Height:       Physical Exam  Constitution: NAD, lying supine in bed Cardio: RRR, no m/r/g Respiratory: course breath sounds bilatearlly, no rales or rhonchi MSK: +1 pitting edema, decompensated Neuro: a&o, flat affect today Skin: c/d/i    Assessment/Plan:  Principal Problem:   Acute on chronic heart failure (HCC) Active Problems:   Major depressive disorder, single episode   Type 2 diabetes mellitus with polyneuropathy (HCC)   Essential hypertension   CKD (chronic kidney disease) stage 4, GFR 15-29 ml/min (HCC)   Acute kidney injury ([place])   Acute on Chronic Diastolic Heart Failure Hypertension Blood pressure increased today with decreased diuresis overnight. Weight increased 1lb. She is continuing to urinate well although measurements incomplete due to incontinence. Cr. Increased 1.97 to 2.17. Overall she appears closer to euvolemia on exam has no crackles but some course breath sounds. She will be medically stable for discharge once a bed is obtained for SNF.   - increase lasix to 80 mg bid  - fluid restrict to 1200cc - cont. PT/OT - am BMP, Mg - cont. Metoprolol, clonidine patch - daily weights, I/O's  AKI on CKD  Cr increased from 1.97 to 2.17 with decreased diuresis from previously. We will increase lasix today and reassess in the am.   - am BMP, Mg  TIIDM Patient had hypoglycemia of 36 overnight, improved to 72 with grahama  crackers. Glucose 216 after breakfast and she was given half her normal humalin dose. Spoke with her Endocrinologist, Dr. Peri Jefferson. Her U500 was recently decreased to only twice a day dosing and not given at dinner due to similar episodes.   - decrease U500 humalin to only twice per day, before breakfast and lunch - hold dinner humilin  - cont. victoza  - q3h glucose today   MDD Mood stable. Continuing home medications.   VTE: lovenox IVF: none Diet: heart healthy/carb modified, fluid restriction 1200cc Code: DNR  Dispo: Anticipated discharge pending SNF placement.   Molli Hazard A, DO 02/15/2018, 8:04 AM Pager: [phone]

## 2018-02-15 NOTE — Progress Notes (Signed)
Patient's current BG is 148. MD notified that BG does not meet the parameters of the sliding scale Humulin daily with lunch.   Patient has been ambulated from chair to bed for bed bath. Patient has tolerated ambulation well. Patient verbalizes headache. To administer PRN tylenol.

## 2018-02-15 NOTE — Clinical Social Work Note (Addendum)
Spoke to [facility]. They will not have a bed until 11/18. Discussed with patient and daughter. Daughter is going to call and speak with admissions coordinator at [facility] to see if they will reconsider denial. Left message for [facility] admissions coordinator asking her to review referral. They definitely do not want [facility] and daughter said home is not an option because they cannot be there 24/7.  Dayton Scrape, CSW [phone]  3:21 pm [facility] declined. Patient's daughter has not had a chance to call [facility].  Dayton Scrape, [place] [phone]  3:26 pm Patient's daughter and CSW spoke with [facility] admissions coordinator. She asked that CSW resend referral so she can review it with her Scientist, physiological.  Dayton Scrape, [place]

## 2018-02-15 NOTE — Progress Notes (Addendum)
BG taken at 0952 of 216. MD paged. Insulin regular human administered MD order half of insulin regular and every two hour BG check.

## 2018-02-15 NOTE — Progress Notes (Signed)
Received a critical value of BG of 36 from 0406 at 0800 this morning. Current BG is 72. Patient eating cracker and drinking orange juice at this moment and awaiting for breakfast tray. MD notified and spoke to.

## 2018-02-16 LAB — GLUCOSE, CAPILLARY
GLUCOSE-CAPILLARY: 195 mg/dL — AB (ref 70–99)
GLUCOSE-CAPILLARY: 144 mg/dL — AB (ref 70–99)
Glucose-Capillary: 205 mg/dL — ABNORMAL HIGH (ref 70–99)

## 2018-02-16 LAB — BASIC METABOLIC PANEL
Anion gap: 11 (ref 5–15)
BUN: 42 mg/dL — ABNORMAL HIGH (ref 8–23)
CHLORIDE: 94 mmol/L — AB (ref 98–111)
CO2: 29 mmol/L (ref 22–32)
Calcium: 8.6 mg/dL — ABNORMAL LOW (ref 8.9–10.3)
Creatinine, Ser: 2.36 mg/dL — ABNORMAL HIGH (ref 0.44–1.00)
GFR calc non Af Amer: 19 mL/min — ABNORMAL LOW (ref >60)
GFR, EST AFRICAN AMERICAN: 22 mL/min — AB (ref >60)
GLUCOSE: 178 mg/dL — AB (ref 70–99)
POTASSIUM: 4.1 mmol/L (ref 3.5–5.1)
SODIUM: 134 mmol/L — AB (ref 135–145)

## 2018-02-16 LAB — MAGNESIUM: Magnesium: 2.1 mg/dL (ref 1.7–2.4)

## 2018-02-16 MED ORDER — BISACODYL 10 MG RE SUPP
10.0000 mg | Freq: Once | RECTAL | Status: DC
  Filled 2018-02-16: qty 1

## 2018-02-16 MED ORDER — POLYETHYLENE GLYCOL 3350 17 G PO PACK
17.0000 g | PACK | Freq: Every day | ORAL | Status: DC
  Administered 2018-02-16 – 2018-02-17 (×2): 17 g via ORAL
  Filled 2018-02-16 (×3): qty 1

## 2018-02-16 MED ORDER — SENNA 8.6 MG PO TABS
2.0000 | ORAL_TABLET | Freq: Two times a day (BID) | ORAL | Status: DC
  Administered 2018-02-16 – 2018-02-19 (×7): 17.2 mg via ORAL
  Filled 2018-02-16 (×7): qty 2

## 2018-02-16 MED ORDER — HEPARIN SODIUM (PORCINE) 5000 UNIT/ML IJ SOLN
5000.0000 [IU] | Freq: Three times a day (TID) | INTRAMUSCULAR | Status: DC
  Administered 2018-02-16 – 2018-02-19 (×8): 5000 [IU] via SUBCUTANEOUS
  Filled 2018-02-16 (×8): qty 1

## 2018-02-16 NOTE — Progress Notes (Signed)
   Subjective:  She states she is feeling more SOB today but is satting well on RA. Endorses minor cough but without sputum production. Denies CP. She still has not had a BM. She has been moving from bed to chair and ambulating with PT.   Objective:  Vital signs in last 24 hours: Vitals:   02/15/18 1927 02/15/18 2156 02/16/18 0543 02/16/18 0750  BP: (!) 169/74 (!) 148/66 (!) 154/61   Pulse: 60  61   Resp: 18  18   Temp: 97.7 F (36.5 C)  97.6 F (36.4 C)   TempSrc: Oral  Oral   SpO2: 94%  91% 96%  Weight:   106.1 kg   Height:       Physical Exam Constitution: NAD, sitting comfortably eating breakfast Cardio: RRR, no m/r/g Respiratory: decreased breath sounds, clear to auscultation Abdominal: non-distended, NTTP, soft GU: pure wick in place, yellow urine Neuro: a&o Skin: trace edema, +2 pedal pulses    Assessment/Plan:  Principal Problem:   Acute on chronic diastolic heart failure (HCC) Active Problems:   Major depressive disorder, single episode   Type 2 diabetes mellitus with polyneuropathy (HCC)   Essential hypertension   CKD (chronic kidney disease) stage 4, GFR 15-29 ml/min (HCC)   Acute kidney injury ([place])  Acute on Chronic Diastolic Heart Failure  Hypertension Increased SOB w/minor cough but no production. Sats 96% RA. Cr bump the last two days so we will hold her lasix today and transition to PO tomorrow. She continues to be above her baseline weight but appears euvolemic on exam. Blood pressuring is remaining increased after diuresis. Will consider increasing bp medications.   - spirometer - cont. PT/OT - encouraged her to continue getting out of bed - transition to PO lasix tomorrow  - fluid restriction 1200cc - am BMP - cont. Metoprolol 57m bid, clonidine patch .327m- daily weights, strict I/O's  AKI on CKD IIIB Cr. 2.36 << 2.17. Baseline ~1.6.   - lasix 80 mg IV bid held today  - transition to PO tomorrow   TIIDM Glucose improved with holding  evening U500 humalin with no hypoglycemia overnight   - cont. u500 qh breakfast and lunch - cont. victoza 1.48m44md  Constipation States she has not had a BM since 11/5.   - Senokot increased to bid - dulcolax suppository - cont. lubiprostone - miralax switched from PRN to qd  VTE: lovenox IVF: none Diet: heart healthy, carb modified Code: DNR  Dispo: Anticipated discharge pending SNF placement.   SeaMarty HeckO 02/16/2018, 8:44 AM Pager: [phone]

## 2018-02-16 NOTE — Progress Notes (Signed)
The patient states that she does not wear a CPAP at home but is willing to try to wear one tonight with a nasal mask. The patient states that she is claustrophobic.

## 2018-02-16 NOTE — Plan of Care (Signed)
  Problem: Clinical Measurements: Goal: Respiratory complications will improve Outcome: Progressing   Problem: Elimination: Goal: Will not experience complications related to urinary retention Outcome: Progressing   Problem: Pain Managment: Goal: General experience of comfort will improve Outcome: Progressing   Problem: Safety: Goal: Ability to remain free from injury will improve Outcome: Progressing   

## 2018-02-17 DIAGNOSIS — F329 Major depressive disorder, single episode, unspecified: Secondary | ICD-10-CM

## 2018-02-17 DIAGNOSIS — D649 Anemia, unspecified: Secondary | ICD-10-CM

## 2018-02-17 DIAGNOSIS — F32A Depression, unspecified: Secondary | ICD-10-CM

## 2018-02-17 LAB — GLUCOSE, CAPILLARY
GLUCOSE-CAPILLARY: 126 mg/dL — AB (ref 70–99)
Glucose-Capillary: 182 mg/dL — ABNORMAL HIGH (ref 70–99)
Glucose-Capillary: 134 mg/dL — ABNORMAL HIGH (ref 70–99)

## 2018-02-17 LAB — CBC
HCT: 30.9 % — ABNORMAL LOW (ref 36.0–46.0)
HEMOGLOBIN: 9.7 g/dL — AB (ref 12.0–15.0)
MCH: 29 pg (ref 26.0–34.0)
MCHC: 31.4 g/dL (ref 30.0–36.0)
MCV: 92.5 fL (ref 80.0–100.0)
NRBC: 0 % (ref 0.0–0.2)
PLATELETS: 217 10*3/uL (ref 150–400)
RBC: 3.34 MIL/uL — AB (ref 3.87–5.11)
RDW: 14.6 % (ref 11.5–15.5)
WBC: 8.6 10*3/uL (ref 4.0–10.5)

## 2018-02-17 LAB — BASIC METABOLIC PANEL
ANION GAP: 8 (ref 5–15)
BUN: 49 mg/dL — ABNORMAL HIGH (ref 8–23)
CALCIUM: 8.9 mg/dL (ref 8.9–10.3)
CO2: 31 mmol/L (ref 22–32)
Chloride: 96 mmol/L — ABNORMAL LOW (ref 98–111)
Creatinine, Ser: 2.57 mg/dL — ABNORMAL HIGH (ref 0.44–1.00)
GFR, EST AFRICAN AMERICAN: 20 mL/min — AB (ref >60)
GFR, EST NON AFRICAN AMERICAN: 17 mL/min — AB (ref >60)
GLUCOSE: 81 mg/dL (ref 70–99)
POTASSIUM: 4.3 mmol/L (ref 3.5–5.1)
SODIUM: 135 mmol/L (ref 135–145)

## 2018-02-17 MED ORDER — AMLODIPINE BESYLATE 10 MG PO TABS
10.0000 mg | ORAL_TABLET | Freq: Every day | ORAL | Status: DC
  Administered 2018-02-17 – 2018-02-19 (×3): 10 mg via ORAL
  Filled 2018-02-17 (×3): qty 1

## 2018-02-17 NOTE — Progress Notes (Signed)
   Subjective:   Discussed with patient and daughter yesterday her diagnosis of OSA and how she has not been using home CPAP. Explained that this could be contributing to her ongoing hypertension and SOB. She understood and tried CPAP overnight with improvement in symptoms. Continuing to work with PT/OT. Today marks the four month anniversary of her husband's death and she is feeling sad. She was interested in speaking with the chaplain about how she was feeling.   Objective:  Vital signs in last 24 hours: Vitals:   02/16/18 2048 02/17/18 0545 02/17/18 0722 02/17/18 0916  BP:  (!) 177/61  (!) 142/58  Pulse: 64 60  63  Resp: 16 18  16   Temp:  97.9 F (36.6 C)    TempSrc:  Oral    SpO2: 95% 95% 96% 96%  Weight:  104.7 kg    Height:       Physical Exam Constitution: NAD, lying supine in bed Cardio: RRR, no m/r/g Respiratory: decrease breath sounds, CTAB MSK: moving all extremities; right arm weaker than left with shoulder TTP GU: pure wick in place Neuro: a&o, pleasant but sad, normal affect Skin: no LE edema, trace proximal thighs bilaterally    Assessment/Plan:  Principal Problem:   Acute on chronic diastolic heart failure (HCC) Active Problems:   Major depressive disorder, single episode   Type 2 diabetes mellitus with polyneuropathy (HCC)   Essential hypertension   CKD (chronic kidney disease) stage 4, GFR 15-29 ml/min (HCC)   Acute kidney injury ([place])   Acute on Chronic Diastolic HF Hypertension Blood pressure continues to remain elevated. She is euvolemic on exam. She has OSA and does not use home CPAP as it makes her feel claustrophobic. She agreed yesterday to try out overnight and has had improvement in SOB and slept better than usual. Cr continuing to increase but GFR stable. We will hold her home dose lasix again today and resume tomorrow.   - start norvasc 10 mg qd; cont. Metop & clonidine .3mg  - cont. PT/OT - hold lasix today, restart home dose tomorrow.  -  am CMP, Mg - encouraged spirometer use  - daily weights, strict I/O's - cont. Fluid restriction 1200cc  OSA Cont. CPAP.   AKI on CKD Stage IIIB  Creatinine increased 2.57 << 2.36 but GFR stable. She is euvolemic on exam. We will hold her lasix again today and restart tomorrow   - am CMP, Mg  MDD Today marks the 4 month anniversary of her husband's death. She is sad but has been speaking with her pastor. Daughter is at bedside. She stated she would like to speak to the chaplain here and consult has been placed.   TIIDM Glucose stable on home medications   - cont. U500 qh breakfast and lunch only  - victoza 1.8 mg qd  Constipation Dulcolax suppository ordered yesterday in addition to miralax and home medications. She declined suppository yesterday and still has not had a BM. She will try the suppository today.   Chronic Anemia Hemoglobin stable. History of chronic anemia. Iron Profile wnl 01/22/18  - am CBC   VTE: heparin IVF: none Diet: heart healthy/carb modified  Code: DNR   Dispo: Anticipated discharge pending SNF placement.   Marty Heck, DO 02/17/2018, 12:23 PM Pager: [phone]

## 2018-02-17 NOTE — Progress Notes (Signed)
Internal Medicine Attending  Date: 02/17/2018  Patient name: Morgan [name] Medical record number: [id number] Date of birth: 28-May-1940 Age: 77 y.o. Gender: female  I saw and evaluated the patient. I reviewed the resident's note by Dr. Sharon Seller and I agree with the resident's findings and plans as documented in her progress note.  When seen in rounds this morning [name] was resting comfortably in bed.  She slept well with the CPAP.  She was sad given the 76-monthanniversary of her husband's death.  Her daughter was at her bedside and has looked at skilled nursing facility beds.  They have found a facility that they are interested in but they may not have an opening for several more days.  Her creatinine continues to rise and is likely from slight overdiuresis.  We are continuing to hold the Lasix and will reassess to restart in the morning assuming the creatinine trends lower.  At this point we are awaiting skilled nursing facility placement.

## 2018-02-17 NOTE — Progress Notes (Signed)
placed pt on CPAP with nasal mask and EPAP of 8. Added H2O to water chamber and pt tolerating well. RT to cont to monitor and RN aware.

## 2018-02-18 LAB — GLUCOSE, CAPILLARY
GLUCOSE-CAPILLARY: 112 mg/dL — AB (ref 70–99)
GLUCOSE-CAPILLARY: 305 mg/dL — AB (ref 70–99)
Glucose-Capillary: 151 mg/dL — ABNORMAL HIGH (ref 70–99)
Glucose-Capillary: 135 mg/dL — ABNORMAL HIGH (ref 70–99)
Glucose-Capillary: 277 mg/dL — ABNORMAL HIGH (ref 70–99)

## 2018-02-18 LAB — COMPREHENSIVE METABOLIC PANEL
ALBUMIN: 2.9 g/dL — AB (ref 3.5–5.0)
ALK PHOS: 149 U/L — AB (ref 38–126)
ALT: 24 U/L (ref 0–44)
AST: 56 U/L — AB (ref 15–41)
Anion gap: 8 (ref 5–15)
BILIRUBIN TOTAL: 1.6 mg/dL — AB (ref 0.3–1.2)
BUN: 48 mg/dL — ABNORMAL HIGH (ref 8–23)
CO2: 30 mmol/L (ref 22–32)
Calcium: 8.8 mg/dL — ABNORMAL LOW (ref 8.9–10.3)
Chloride: 96 mmol/L — ABNORMAL LOW (ref 98–111)
Creatinine, Ser: 2.29 mg/dL — ABNORMAL HIGH (ref 0.44–1.00)
GFR calc Af Amer: 23 mL/min — ABNORMAL LOW (ref >60)
GFR calc non Af Amer: 19 mL/min — ABNORMAL LOW (ref >60)
GLUCOSE: 130 mg/dL — AB (ref 70–99)
POTASSIUM: 4.9 mmol/L (ref 3.5–5.1)
Sodium: 134 mmol/L — ABNORMAL LOW (ref 135–145)
TOTAL PROTEIN: 6.1 g/dL — AB (ref 6.5–8.1)

## 2018-02-18 LAB — MAGNESIUM: Magnesium: 2.6 mg/dL — ABNORMAL HIGH (ref 1.7–2.4)

## 2018-02-18 MED ORDER — POLYETHYLENE GLYCOL 3350 17 G PO PACK
17.0000 g | PACK | Freq: Every day | ORAL | Status: DC
  Administered 2018-02-18 – 2018-02-19 (×2): 17 g via ORAL
  Filled 2018-02-18: qty 1

## 2018-02-18 MED ORDER — BISACODYL 10 MG RE SUPP: 10.0000 mg | Freq: Once | RECTAL | Status: DC

## 2018-02-18 NOTE — Progress Notes (Signed)
   Subjective:  Patient doing well this morning. SOB improved, she is using her CPAP and agrees this will help at home. Continues to work with PT and is waiting on SNF placement. Chaplain was unable to come yesterday but will come speak with her today.   Objective:  Vital signs in last 24 hours: Vitals:   02/17/18 1353 02/17/18 1927 02/17/18 1948 02/18/18 0404  BP: (!) 162/56  (!) 128/46 (!) 137/53  Pulse: (!) 59 64 63 61  Resp: 18 16 18 18   Temp: 97.8 F (36.6 C)  98 F (36.7 C) 97.8 F (36.6 C)  TempSrc: Oral  Oral Oral  SpO2: 97% 97% 95% 93%  Weight:    105.7 kg  Height:       Physical Exam  Constitution: NAD, obese, supine in bed Respiratory: non-labored breathing MSK: moving all extremities; right arm weaker than left with shoulder TTP Neuro: a&o, cooperative Skin: no edema, c/d/i    Assessment/Plan:  Principal Problem:   Acute on chronic diastolic heart failure (HCC) Active Problems:   Major depressive disorder, single episode   Type 2 diabetes mellitus with polyneuropathy (HCC)   Essential hypertension   CKD (chronic kidney disease) stage 4, GFR 15-29 ml/min (HCC)   Acute kidney injury ([place])   Depression  Acute on Chronic Diastolic Heart Failure Holding lasix again today as she is diuresed. Cr trending back down. We will continue to monitor until SNF placement and will likely restart her back on her home lasix po tomorrow. Her blood pressure is doing well with use of her CPAP and will likely improve more with norvasc. She is medically stable for discharge.   - cont. norvasc 10 mg qd and home bp medications - hold lasix today - am BMP, Mg - cont. CPAP - daily weights, strict I/O's, fluid restriction   AKI on CKD Stage IIIB Cr trending down after holding diuretics. Will cont. To hold today and restart home dose tomorrow. Mg slightly elevated and will repeat in the am.   - am BMP, Mg  MDD Mood improved today. Discussed that chaplain was unable to come  yesterday but will be in today.   Constipation She did not receive her suppository yesterday and will reorder today   - cont. Miralax, lubiprostone, and senokot  TIIDM Glucose controlled with home medications.   VTE: heparin  IVF: none  Diet: heart healthy/carb modified Code: DNR  Dispo: Anticipated discharge pending SNF placement.   Marty Heck, DO 02/18/2018, 6:45 AM Pager: [phone]

## 2018-02-18 NOTE — Progress Notes (Signed)
Physical Therapy Treatment Patient Details Name: Morgan [name] MRN: [id number] DOB: May 24, 1940 Today's Date: 02/18/2018    History of Present Illness Ms. Coryell is a 77 year old woman with chronic diastolic heart failure EF 65-70%, atrial flutter, arthrosclerotic CAD, CKD III/IV, pulmonary HTN, obstructive sleep apnea, DM2, hypothyroidism, nonalcoholic steatohepatitis, seizure disorder, emphysema, major depressive disorder, meningioma s/p right frontal craniotomy, breast cancer s/p left mastectomy and abnormal gait presenting for evaluation of SOB and anasarca.  Of note, pt recent psych admit due to suicidal ideation.     PT Comments    Patient progressing slowly towards PT goals. Improved ambulation distance with Min A for balance/safety. Pt requires longer seated rest breaks due to fatigue and SOB. Fatigues easily. Pt with 2-3/4 DOE. Continues to be a high fall risk. VSS throughout. Highly motivated to maximize independence.     Follow Up Recommendations  SNF;Supervision/Assistance - 24 hour     Equipment Recommendations  None recommended by PT    Recommendations for Other Services       Precautions / Restrictions Precautions Precautions: Fall Precaution Comments: 12 falls in last year per dtr Restrictions Weight Bearing Restrictions: No    Mobility  Bed Mobility               General bed mobility comments: Sitting in chair upon PT arrival.  Transfers Overall transfer level: Needs assistance Equipment used: 4-wheeled walker Transfers: Sit to/from Stand Sit to Stand: Min guard         General transfer comment: Min guard for safety. Stood from chair x3. Increased time and effort.  Ambulation/Gait Ambulation/Gait assistance: Min assist;+2 safety/equipment Gait Distance (Feet): 75 Feet(+40' + 50') Assistive device: 4-wheeled walker Gait Pattern/deviations: Step-through pattern;Decreased stride length;Wide base of support;Trunk flexed;Antalgic Gait velocity:  decreased Gait velocity interpretation: <1.31 ft/sec, indicative of household ambulator General Gait Details: Cues for upright posture and rollator proximity as pt tends to flex too far forward. 2 seated rest breaks. 2-3/4 DOE. VSS throughout.   Stairs             Wheelchair Mobility    Modified Rankin (Stroke Patients Only)       Balance Overall balance assessment: Needs assistance Sitting-balance support: No upper extremity supported;Feet supported Sitting balance-Leahy Scale: Fair     Standing balance support: Bilateral upper extremity supported;During functional activity   Standing balance comment: reliant on rollator for support.                             Cognition Arousal/Alertness: Awake/alert Behavior During Therapy: WFL for tasks assessed/performed Overall Cognitive Status: Within Functional Limits for tasks assessed                                        Exercises      General Comments        Pertinent Vitals/Pain Pain Assessment: No/denies pain    Home Living                      Prior Function            PT Goals (current goals can now be found in the care plan section) Progress towards PT goals: Progressing toward goals    Frequency    Min 3X/week      PT Plan Current plan remains appropriate    Co-evaluation  AM-PAC PT "6 Clicks" Daily Activity  Outcome Measure  Difficulty turning over in bed (including adjusting bedclothes, sheets and blankets)?: Unable Difficulty moving from lying on back to sitting on the side of the bed? : Unable Difficulty sitting down on and standing up from a chair with arms (e.g., wheelchair, bedside commode, etc,.)?: A Little Help needed moving to and from a bed to chair (including a wheelchair)?: A Little Help needed walking in hospital room?: A Little Help needed climbing 3-5 steps with a railing? : A Lot 6 Click Score: 13    End of Session  Equipment Utilized During Treatment: Gait belt Activity Tolerance: Patient limited by fatigue Patient left: in chair;with call bell/phone within reach Nurse Communication: Mobility status PT Visit Diagnosis: Unsteadiness on feet (R26.81);Muscle weakness (generalized) (M62.81)     Time: 4469-5072 PT Time Calculation (min) (ACUTE ONLY): 25 min  Charges:  $Gait Training: 8-22 mins $Therapeutic Activity: 8-22 mins                     Wray Kearns, [place], DPT Acute Rehabilitation Services Pager [phone] Office [place] 02/18/2018, 3:35 PM

## 2018-02-18 NOTE — Progress Notes (Signed)
Internal Medicine Attending:   I saw and examined the patient. I reviewed the resident's note and I agree with the resident's findings and plan as documented in the resident's note.  Principal Problem:   Acute on chronic diastolic heart failure (HCC) Active Problems:   Type 2 diabetes mellitus with polyneuropathy (HCC)   Essential hypertension   CKD (chronic kidney disease) stage 4, GFR 15-29 ml/min (HCC)   Acute kidney injury ([place])   Depression   Clinically stable, acute on chronic heart failure with preserved ejection fraction is well controlled today.  Volume status appears euvolemic.  We will continue to work on mobilizing her out of bed.  She is stable for discharge to skilled nursing facility for subacute rehabilitation once a bed is available.  Lalla Brothers, MD

## 2018-02-18 NOTE — Clinical Social Work Note (Signed)
Clinical Social Worker continuing to follow patient and family for support and discharge planning needs.  Patient daughter with preference to [facility] contacted facility and they are now in agreement with admission.  Patient will have bed available 11/12.  CSW contacted patient daughter with update - patient family plans to provide transport.  CSW remains available for support and to facilitate patient discharge needs.  Barbette Or, [place]

## 2018-02-18 NOTE — Progress Notes (Addendum)
Pt already on placed on CPAP by RN and tolerating well. Will cont to monitor. Settings: EPAP 8

## 2018-02-18 NOTE — Plan of Care (Signed)
  Problem: Education: Goal: Knowledge of General Education information will improve Description Including pain rating scale, medication(s)/side effects and non-pharmacologic comfort measures Outcome: Progressing   Problem: Clinical Measurements: Goal: Ability to maintain clinical measurements within normal limits will improve Outcome: Progressing   Problem: Clinical Measurements: Goal: Diagnostic test results will improve Outcome: Progressing   Problem: Activity: Goal: Risk for activity intolerance will decrease Outcome: Progressing   Problem: Pain Managment: Goal: General experience of comfort will improve Outcome: Progressing   Problem: Safety: Goal: Ability to remain free from injury will improve Outcome: Progressing   Problem: Skin Integrity: Goal: Risk for impaired skin integrity will decrease Outcome: Progressing   Problem: Activity: Goal: Capacity to carry out activities will improve Outcome: Progressing

## 2018-02-18 NOTE — Progress Notes (Signed)
Pt placed on Cpap [id number] tolerating well.

## 2018-02-18 NOTE — Clinical Social Work Note (Addendum)
Left voicemail for [facility] admissions coordinator to follow up on decision.  Dayton Scrape, CSW [phone]  11:45 am [facility] is still not offering a bed. Discussed with daughter. They still do not want [facility]. Daughter agreeable to sending out referral to [facility] facilities. [facility] still does not have a bed available. Emailed daughter SNF list so she can review scores. MD has been updated.  Dayton Scrape, [place]

## 2018-02-18 NOTE — Progress Notes (Signed)
   02/18/18 1000  Clinical Encounter Type  Visited With Patient  Visit Type Initial  Referral From Nurse  Consult/Referral To Chaplain  Spiritual Encounters  Spiritual Needs Emotional;Grief support;Prayer  Stress Factors  Patient Stress Factors Exhausted;Family relationships;Health changes;Major life changes   Responded to spiritual care consult. PT was alert, in tears and Nurse was tending her. PT was concerned for her glasses that were misplaced. Also, she was grieving for the loss of her husband. PT was thankful for my visit. I offered spiritual care with ministry of presence, a listening ear, words of encouragement and prayer. Chaplain available as needed.   Chaplain Fidel Levy [phone]

## 2018-02-19 LAB — BASIC METABOLIC PANEL
Anion gap: 9 (ref 5–15)
BUN: 48 mg/dL — AB (ref 8–23)
CALCIUM: 8.6 mg/dL — AB (ref 8.9–10.3)
CO2: 27 mmol/L (ref 22–32)
Chloride: 96 mmol/L — ABNORMAL LOW (ref 98–111)
Creatinine, Ser: 2.22 mg/dL — ABNORMAL HIGH (ref 0.44–1.00)
GFR calc non Af Amer: 20 mL/min — ABNORMAL LOW (ref >60)
GFR, EST AFRICAN AMERICAN: 23 mL/min — AB (ref >60)
Glucose, Bld: 80 mg/dL (ref 70–99)
Potassium: 4.3 mmol/L (ref 3.5–5.1)
SODIUM: 132 mmol/L — AB (ref 135–145)

## 2018-02-19 LAB — GLUCOSE, CAPILLARY: Glucose-Capillary: 92 mg/dL (ref 70–99)

## 2018-02-19 LAB — MAGNESIUM: Magnesium: 2.5 mg/dL — ABNORMAL HIGH (ref 1.7–2.4)

## 2018-02-19 MED ORDER — AMLODIPINE BESYLATE 10 MG PO TABS: 10.0000 mg | ORAL_TABLET | Freq: Every day | ORAL | Status: DC

## 2018-02-19 MED ORDER — AMIODARONE HCL 200 MG PO TABS: 100.0000 mg | ORAL_TABLET | Freq: Every day | ORAL | Status: DC

## 2018-02-19 NOTE — Progress Notes (Signed)
Report called to [facility] and spoke to [place], all questions entertained and answered, advised pt just left with family , pt assist x1 and has rollator, pt was stable on discharge and had eaten lunch prior to dc

## 2018-02-19 NOTE — Progress Notes (Signed)
RN provided gait belt to pt.

## 2018-02-19 NOTE — Care Management Important Message (Signed)
Important Message  Patient Details  Name: Morgan [name] MRN: [id number] Date of Birth: 12/07/1940   Medicare Important Message Given:  Yes    Leelynd Maldonado P Jacobb Alen 02/19/2018, 2:12 PM

## 2018-02-19 NOTE — Clinical Social Work Note (Signed)
CSW facilitated patient discharge including contacting patient family and facility to confirm patient discharge plans. Clinical information faxed to facility and family agreeable with plan. Patient's daughter will transport patient to [facility] by car. RN to call report prior to discharge ([phone] ext [phone]. Room 712).  CSW will sign off for now as social work intervention is no longer needed. Please consult [place] again if new needs arise.  Dayton Scrape, [place]

## 2018-02-19 NOTE — Progress Notes (Signed)
Pt and daughter asking about discharge, advised social worker left message for admissions to make sure ok for pt to head to facillity, left message for sara to give update so family may leave

## 2018-02-19 NOTE — Clinical Social Work Placement (Signed)
   CLINICAL SOCIAL WORK PLACEMENT  NOTE  Date:  02/19/2018  Patient Details  Name: Morgan [name] MRN: [id number] Date of Birth: [birth year]  Clinical Social Work is seeking post-discharge placement for this patient at the [place] level of care (*CSW will initial, date and re-position this form in  chart as items are completed):  Yes   Patient/family provided with [facility] Work Department's list of facilities offering this level of care within the geographic area requested by the patient (or if unable, by the patient's family).  Yes   Patient/family informed of their freedom to choose among providers that offer the needed level of care, that participate in Medicare, Medicaid or managed care program needed by the patient, have an available bed and are willing to accept the patient.  Yes   Patient/family informed of [facility]'s ownership interest in [facility] and [facility], as well as of the fact that they are under no obligation to receive care at these facilities.  PASRR submitted to EDS on 02/13/18     PASRR number received on       Existing PASRR number confirmed on 02/13/18     FL2 transmitted to all facilities in geographic area requested by pt/family on 02/13/18     FL2 transmitted to all facilities within larger geographic area on       Patient informed that his/her managed care company has contracts with or will negotiate with certain facilities, including the following:        Yes   Patient/family informed of bed offers received.  Patient chooses bed at [place]     Physician recommends and patient chooses bed at      Patient to be transferred to [facility] on 02/19/18.  Patient to be transferred to facility by Daughter will transport by car     Patient family notified on 02/19/18 of transfer.  Name of family member notified:  [name] Price     PHYSICIAN Please prepare prescriptions     Additional  Comment:    _______________________________________________ Candie Chroman, LCSW 02/19/2018, 12:54 PM

## 2018-02-19 NOTE — Progress Notes (Signed)
   Subjective:  She states she is feeling well this morning. She has had two BM. Continues to have some mild SOB but is near her baseline and is using CPAP.   Objective:  Vital signs in last 24 hours: Vitals:   02/18/18 2330 02/19/18 0100 02/19/18 0427 02/19/18 0743  BP:   (!) 142/57   Pulse: 73  60   Resp: 15  18   Temp:   98 F (36.7 C)   TempSrc:   Oral   SpO2: 96%  96% 94%  Weight:  105.5 kg    Height:       Physical Exam   Constitution: NAD, supine in bed Cardio: RRR, no m/r/g Respiratory: CTAB, no w/r/r MSK: moving all extremities, right arm weaker than left with shoulder TTP Neuro: a&o, pleasant, normal affect Skin: c/d/i, no LE edema    Assessment/Plan:  Principal Problem:   Chronic diastolic heart failure (HCC) Active Problems:   Type 2 diabetes mellitus with polyneuropathy (HCC)   Essential hypertension   CKD (chronic kidney disease) stage 4, GFR 15-29 ml/min (HCC)   Acute kidney injury (HCC)   Depression   Chronic Diastolic Heart Failure  Patient is euvolemic and SOB has decreased. Tolerating CPAP well. Cr is trending down and BP stable. She is medically stable for discharge.   - hold lasix again today  - does not have her old CPAP and may need new outpatient sleep study  - cont norvasc and home bp medications  AKI on CKD Stage IIIB Cr trending down and GFR improving.   - hold lasix today, resume tomorrow   MDD Patient cheerful this morning and accompanied by daughter. States she is ready to work with PT at SNF  Constipation Resolved   VTE: heparin IVF: none Diet: heart healthy/carb modified Code: DNR  Dispo: Anticipated discharge today.   Molli Hazard A, DO 02/19/2018, 8:47 AM Pager: [phone]

## 2018-02-19 NOTE — Clinical Social Work Note (Signed)
Discharge summary sent to SNF. Left voicemail for admissions coordinator to confirm patient's daughter can go ahead and transport her to the facility.  Dayton Scrape, [place]

## 2018-02-19 NOTE — Progress Notes (Signed)
Attempted to call report but pt no answer or able to leave voice mail

## 2018-03-26 ENCOUNTER — Emergency Department (HOSPITAL_COMMUNITY): Payer: Medicare Other

## 2018-03-26 ENCOUNTER — Encounter (HOSPITAL_COMMUNITY): Payer: Self-pay

## 2018-03-26 ENCOUNTER — Inpatient Hospital Stay (HOSPITAL_COMMUNITY)
Admission: EM | Admit: 2018-03-26 | Discharge: 2018-04-02 | DRG: 291 | Disposition: A | Discharge status: Skilled Nursing Facility | Payer: Medicare Other | Source: Skilled Nursing Facility | Attending: Internal Medicine | Admitting: Internal Medicine

## 2018-03-26 DIAGNOSIS — G40909 Epilepsy, unspecified, not intractable, without status epilepticus: Secondary | ICD-10-CM | POA: Diagnosis present

## 2018-03-26 DIAGNOSIS — E1122 Type 2 diabetes mellitus with diabetic chronic kidney disease: Secondary | ICD-10-CM | POA: Diagnosis present

## 2018-03-26 DIAGNOSIS — E1151 Type 2 diabetes mellitus with diabetic peripheral angiopathy without gangrene: Secondary | ICD-10-CM | POA: Diagnosis present

## 2018-03-26 DIAGNOSIS — N183 Chronic kidney disease, stage 3 unspecified: Secondary | ICD-10-CM

## 2018-03-26 DIAGNOSIS — J449 Chronic obstructive pulmonary disease, unspecified: Secondary | ICD-10-CM | POA: Diagnosis present

## 2018-03-26 DIAGNOSIS — Z7982 Long term (current) use of aspirin: Secondary | ICD-10-CM

## 2018-03-26 DIAGNOSIS — I4892 Unspecified atrial flutter: Secondary | ICD-10-CM | POA: Diagnosis present

## 2018-03-26 DIAGNOSIS — Z7951 Long term (current) use of inhaled steroids: Secondary | ICD-10-CM

## 2018-03-26 DIAGNOSIS — K5900 Constipation, unspecified: Secondary | ICD-10-CM | POA: Diagnosis present

## 2018-03-26 DIAGNOSIS — Z79899 Other long term (current) drug therapy: Secondary | ICD-10-CM

## 2018-03-26 DIAGNOSIS — Z981 Arthrodesis status: Secondary | ICD-10-CM

## 2018-03-26 DIAGNOSIS — Z86011 Personal history of benign neoplasm of the brain: Secondary | ICD-10-CM

## 2018-03-26 DIAGNOSIS — Z66 Do not resuscitate: Secondary | ICD-10-CM | POA: Diagnosis present

## 2018-03-26 DIAGNOSIS — I130 Hypertensive heart and chronic kidney disease with heart failure and stage 1 through stage 4 chronic kidney disease, or unspecified chronic kidney disease: Secondary | ICD-10-CM | POA: Diagnosis not present

## 2018-03-26 DIAGNOSIS — T502X5A Adverse effect of carbonic-anhydrase inhibitors, benzothiadiazides and other diuretics, initial encounter: Secondary | ICD-10-CM | POA: Diagnosis not present

## 2018-03-26 DIAGNOSIS — Z8249 Family history of ischemic heart disease and other diseases of the circulatory system: Secondary | ICD-10-CM

## 2018-03-26 DIAGNOSIS — I5033 Acute on chronic diastolic (congestive) heart failure: Secondary | ICD-10-CM | POA: Diagnosis not present

## 2018-03-26 DIAGNOSIS — I509 Heart failure, unspecified: Secondary | ICD-10-CM

## 2018-03-26 DIAGNOSIS — F32A Depression, unspecified: Secondary | ICD-10-CM | POA: Diagnosis present

## 2018-03-26 DIAGNOSIS — Z905 Acquired absence of kidney: Secondary | ICD-10-CM

## 2018-03-26 DIAGNOSIS — Z6841 Body Mass Index (BMI) 40.0 and over, adult: Secondary | ICD-10-CM

## 2018-03-26 DIAGNOSIS — Z833 Family history of diabetes mellitus: Secondary | ICD-10-CM

## 2018-03-26 DIAGNOSIS — Z794 Long term (current) use of insulin: Secondary | ICD-10-CM

## 2018-03-26 DIAGNOSIS — K219 Gastro-esophageal reflux disease without esophagitis: Secondary | ICD-10-CM | POA: Diagnosis present

## 2018-03-26 DIAGNOSIS — I10 Essential (primary) hypertension: Secondary | ICD-10-CM | POA: Diagnosis present

## 2018-03-26 DIAGNOSIS — R0602 Shortness of breath: Secondary | ICD-10-CM | POA: Diagnosis not present

## 2018-03-26 DIAGNOSIS — R7989 Other specified abnormal findings of blood chemistry: Secondary | ICD-10-CM | POA: Diagnosis present

## 2018-03-26 DIAGNOSIS — E063 Autoimmune thyroiditis: Secondary | ICD-10-CM | POA: Diagnosis present

## 2018-03-26 DIAGNOSIS — E669 Obesity, unspecified: Secondary | ICD-10-CM | POA: Diagnosis present

## 2018-03-26 DIAGNOSIS — E1142 Type 2 diabetes mellitus with diabetic polyneuropathy: Secondary | ICD-10-CM | POA: Diagnosis present

## 2018-03-26 DIAGNOSIS — Z853 Personal history of malignant neoplasm of breast: Secondary | ICD-10-CM

## 2018-03-26 DIAGNOSIS — Z9012 Acquired absence of left breast and nipple: Secondary | ICD-10-CM

## 2018-03-26 DIAGNOSIS — R945 Abnormal results of liver function studies: Secondary | ICD-10-CM | POA: Diagnosis present

## 2018-03-26 DIAGNOSIS — Z85828 Personal history of other malignant neoplasm of skin: Secondary | ICD-10-CM

## 2018-03-26 DIAGNOSIS — R296 Repeated falls: Secondary | ICD-10-CM | POA: Diagnosis present

## 2018-03-26 DIAGNOSIS — N179 Acute kidney failure, unspecified: Secondary | ICD-10-CM | POA: Diagnosis present

## 2018-03-26 DIAGNOSIS — Z79890 Hormone replacement therapy (postmenopausal): Secondary | ICD-10-CM

## 2018-03-26 DIAGNOSIS — F329 Major depressive disorder, single episode, unspecified: Secondary | ICD-10-CM | POA: Diagnosis present

## 2018-03-26 DIAGNOSIS — E782 Mixed hyperlipidemia: Secondary | ICD-10-CM | POA: Diagnosis present

## 2018-03-26 LAB — CBC WITH DIFFERENTIAL/PLATELET
Abs Immature Granulocytes: 0.12 10*3/uL — ABNORMAL HIGH (ref 0.00–0.07)
Basophils Absolute: 0 10*3/uL (ref 0.0–0.1)
Basophils Relative: 1 %
EOS ABS: 0.2 10*3/uL (ref 0.0–0.5)
EOS PCT: 3 %
HCT: 28.7 % — ABNORMAL LOW (ref 36.0–46.0)
HEMOGLOBIN: 8.9 g/dL — AB (ref 12.0–15.0)
IMMATURE GRANULOCYTES: 2 %
LYMPHS PCT: 19 %
Lymphs Abs: 1.3 10*3/uL (ref 0.7–4.0)
MCH: 29.2 pg (ref 26.0–34.0)
MCHC: 31 g/dL (ref 30.0–36.0)
MCV: 94.1 fL (ref 80.0–100.0)
MONOS PCT: 8 %
Monocytes Absolute: 0.5 10*3/uL (ref 0.1–1.0)
Neutro Abs: 4.6 10*3/uL (ref 1.7–7.7)
Neutrophils Relative %: 67 %
Platelets: 203 10*3/uL (ref 150–400)
RBC: 3.05 MIL/uL — ABNORMAL LOW (ref 3.87–5.11)
RDW: 14.4 % (ref 11.5–15.5)
WBC: 6.8 10*3/uL (ref 4.0–10.5)
nRBC: 0 % (ref 0.0–0.2)

## 2018-03-26 LAB — I-STAT VENOUS BLOOD GAS, ED
ACID-BASE DEFICIT: 1 mmol/L (ref 0.0–2.0)
BICARBONATE: 24.8 mmol/L (ref 20.0–28.0)
O2 SAT: 53 %
PO2 VEN: 30 mmHg — AB (ref 32.0–45.0)
TCO2: 26 mmol/L (ref 22–32)
pCO2, Ven: 46.2 mmHg (ref 44.0–60.0)
pH, Ven: 7.338 (ref 7.250–7.430)

## 2018-03-26 LAB — COMPREHENSIVE METABOLIC PANEL
ALBUMIN: 3 g/dL — AB (ref 3.5–5.0)
ALK PHOS: 238 U/L — AB (ref 38–126)
ALT: 48 U/L — ABNORMAL HIGH (ref 0–44)
AST: 68 U/L — AB (ref 15–41)
Anion gap: 10 (ref 5–15)
BILIRUBIN TOTAL: 0.9 mg/dL (ref 0.3–1.2)
BUN: 30 mg/dL — AB (ref 8–23)
CALCIUM: 8.7 mg/dL — AB (ref 8.9–10.3)
CO2: 23 mmol/L (ref 22–32)
Chloride: 103 mmol/L (ref 98–111)
Creatinine, Ser: 2.06 mg/dL — ABNORMAL HIGH (ref 0.44–1.00)
GFR calc Af Amer: 26 mL/min — ABNORMAL LOW (ref >60)
GFR, EST NON AFRICAN AMERICAN: 23 mL/min — AB (ref >60)
GLUCOSE: 147 mg/dL — AB (ref 70–99)
POTASSIUM: 4.7 mmol/L (ref 3.5–5.1)
Sodium: 136 mmol/L (ref 135–145)
Total Protein: 6.6 g/dL (ref 6.5–8.1)

## 2018-03-26 LAB — GLUCOSE, CAPILLARY: Glucose-Capillary: 92 mg/dL (ref 70–99)

## 2018-03-26 LAB — I-STAT TROPONIN, ED: TROPONIN I, POC: 0.01 ng/mL (ref 0.00–0.08)

## 2018-03-26 LAB — TROPONIN I: Troponin I: <0.03 ng/mL (ref <0.03)

## 2018-03-26 LAB — BRAIN NATRIURETIC PEPTIDE: B Natriuretic Peptide: 264.5 pg/mL — ABNORMAL HIGH (ref 0.0–100.0)

## 2018-03-26 MED ORDER — LAMOTRIGINE 25 MG PO TABS
100.0000 mg | ORAL_TABLET | Freq: Two times a day (BID) | ORAL | Status: DC
  Administered 2018-03-27 – 2018-04-02 (×13): 100 mg via ORAL
  Filled 2018-03-26 (×13): qty 4

## 2018-03-26 MED ORDER — LEVOTHYROXINE SODIUM 100 MCG PO TABS
200.0000 ug | ORAL_TABLET | Freq: Every day | ORAL | Status: DC
  Administered 2018-03-27 – 2018-04-02 (×7): 200 ug via ORAL
  Filled 2018-03-26 (×7): qty 2

## 2018-03-26 MED ORDER — ARIPIPRAZOLE 2 MG PO TABS
5.0000 mg | ORAL_TABLET | Freq: Every evening | ORAL | Status: DC
  Administered 2018-03-27 – 2018-04-01 (×6): 5 mg via ORAL
  Filled 2018-03-26 (×7): qty 3

## 2018-03-26 MED ORDER — VITAMIN B-12 1000 MCG PO TABS
1000.0000 ug | ORAL_TABLET | Freq: Every day | ORAL | Status: DC
  Administered 2018-03-27 – 2018-04-02 (×7): 1000 ug via ORAL
  Filled 2018-03-26 (×7): qty 1

## 2018-03-26 MED ORDER — ASPIRIN EC 81 MG PO TBEC
81.0000 mg | DELAYED_RELEASE_TABLET | Freq: Every day | ORAL | Status: DC
  Administered 2018-03-27 – 2018-04-02 (×7): 81 mg via ORAL
  Filled 2018-03-26 (×7): qty 1

## 2018-03-26 MED ORDER — ACETAMINOPHEN 325 MG PO TABS
650.0000 mg | ORAL_TABLET | Freq: Four times a day (QID) | ORAL | Status: DC | PRN
  Administered 2018-03-28 – 2018-03-31 (×3): 650 mg via ORAL
  Filled 2018-03-26 (×3): qty 2

## 2018-03-26 MED ORDER — ONDANSETRON HCL 4 MG PO TABS: 4.0000 mg | ORAL_TABLET | Freq: Four times a day (QID) | ORAL | Status: DC | PRN

## 2018-03-26 MED ORDER — ONDANSETRON HCL 4 MG/2ML IJ SOLN: 4.0000 mg | Freq: Four times a day (QID) | INTRAMUSCULAR | Status: DC | PRN

## 2018-03-26 MED ORDER — PANTOPRAZOLE SODIUM 40 MG PO TBEC
40.0000 mg | DELAYED_RELEASE_TABLET | Freq: Two times a day (BID) | ORAL | Status: DC
  Administered 2018-03-27 – 2018-04-02 (×13): 40 mg via ORAL
  Filled 2018-03-26 (×13): qty 1

## 2018-03-26 MED ORDER — NYSTATIN 100000 UNIT/GM EX POWD
Freq: Three times a day (TID) | CUTANEOUS | Status: DC
  Administered 2018-03-27 – 2018-04-02 (×18): via TOPICAL
  Filled 2018-03-26 (×2): qty 15

## 2018-03-26 MED ORDER — CLONIDINE HCL 0.3 MG/24HR TD PTWK
0.3000 mg | MEDICATED_PATCH | TRANSDERMAL | Status: DC
  Administered 2018-03-29: 0.3 mg via TRANSDERMAL
  Filled 2018-03-26: qty 1

## 2018-03-26 MED ORDER — COLCHICINE 0.6 MG PO TABS: 0.6000 mg | ORAL_TABLET | Freq: Every day | ORAL | Status: DC | PRN

## 2018-03-26 MED ORDER — DULOXETINE HCL 60 MG PO CPEP
60.0000 mg | ORAL_CAPSULE | Freq: Two times a day (BID) | ORAL | Status: DC
  Administered 2018-03-27 – 2018-04-02 (×13): 60 mg via ORAL
  Filled 2018-03-26 (×13): qty 1

## 2018-03-26 MED ORDER — HEPARIN SODIUM (PORCINE) 5000 UNIT/ML IJ SOLN
5000.0000 [IU] | Freq: Three times a day (TID) | INTRAMUSCULAR | Status: DC
  Administered 2018-03-26 – 2018-04-02 (×20): 5000 [IU] via SUBCUTANEOUS
  Filled 2018-03-26 (×19): qty 1

## 2018-03-26 MED ORDER — SENNA 8.6 MG PO TABS
2.0000 | ORAL_TABLET | Freq: Every day | ORAL | Status: DC
  Administered 2018-03-27 – 2018-03-31 (×5): 17.2 mg via ORAL
  Filled 2018-03-26 (×6): qty 2

## 2018-03-26 MED ORDER — LIRAGLUTIDE 18 MG/3ML ~~LOC~~ SOPN: 1.8000 mg | PEN_INJECTOR | Freq: Every day | SUBCUTANEOUS | Status: DC

## 2018-03-26 MED ORDER — FUROSEMIDE 10 MG/ML IJ SOLN
60.0000 mg | Freq: Once | INTRAMUSCULAR | Status: AC
  Administered 2018-03-26: 60 mg via INTRAVENOUS
  Filled 2018-03-26: qty 6

## 2018-03-26 MED ORDER — AMIODARONE HCL 200 MG PO TABS
100.0000 mg | ORAL_TABLET | Freq: Every day | ORAL | Status: DC
  Administered 2018-03-27 – 2018-04-02 (×7): 100 mg via ORAL
  Filled 2018-03-26 (×7): qty 1

## 2018-03-26 MED ORDER — IPRATROPIUM-ALBUTEROL 0.5-2.5 (3) MG/3ML IN SOLN
3.0000 mL | Freq: Four times a day (QID) | RESPIRATORY_TRACT | Status: DC | PRN
  Administered 2018-03-31: 3 mL via RESPIRATORY_TRACT
  Filled 2018-03-26: qty 3

## 2018-03-26 MED ORDER — AMLODIPINE BESYLATE 10 MG PO TABS
10.0000 mg | ORAL_TABLET | Freq: Every day | ORAL | Status: DC
  Administered 2018-03-27 – 2018-04-02 (×7): 10 mg via ORAL
  Filled 2018-03-26 (×7): qty 1

## 2018-03-26 MED ORDER — MOMETASONE FURO-FORMOTEROL FUM 100-5 MCG/ACT IN AERO
2.0000 | INHALATION_SPRAY | Freq: Two times a day (BID) | RESPIRATORY_TRACT | Status: DC
  Administered 2018-03-27 – 2018-04-02 (×13): 2 via RESPIRATORY_TRACT
  Filled 2018-03-26: qty 8.8

## 2018-03-26 MED ORDER — ALPRAZOLAM 0.5 MG PO TABS: 0.5000 mg | ORAL_TABLET | Freq: Three times a day (TID) | ORAL | Status: DC | PRN

## 2018-03-26 MED ORDER — NITROGLYCERIN 0.4 MG SL SUBL: 0.4000 mg | SUBLINGUAL_TABLET | SUBLINGUAL | Status: DC | PRN

## 2018-03-26 MED ORDER — METOPROLOL TARTRATE 25 MG PO TABS
25.0000 mg | ORAL_TABLET | Freq: Two times a day (BID) | ORAL | Status: DC
  Administered 2018-03-27 – 2018-03-28 (×3): 25 mg via ORAL
  Filled 2018-03-26 (×3): qty 1

## 2018-03-26 MED ORDER — ACETAMINOPHEN 650 MG RE SUPP: 650.0000 mg | Freq: Four times a day (QID) | RECTAL | Status: DC | PRN

## 2018-03-26 MED ORDER — PANTOPRAZOLE SODIUM 40 MG IV SOLR
40.0000 mg | Freq: Once | INTRAVENOUS | Status: AC
  Administered 2018-03-26: 40 mg via INTRAVENOUS
  Filled 2018-03-26: qty 40

## 2018-03-26 MED ORDER — LUBIPROSTONE 8 MCG PO CAPS
8.0000 ug | ORAL_CAPSULE | Freq: Two times a day (BID) | ORAL | Status: DC
  Administered 2018-03-27 – 2018-04-02 (×12): 8 ug via ORAL
  Filled 2018-03-26 (×15): qty 1

## 2018-03-26 MED ORDER — INSULIN ASPART 100 UNIT/ML ~~LOC~~ SOLN
0.0000 [IU] | Freq: Three times a day (TID) | SUBCUTANEOUS | Status: DC
  Administered 2018-03-27: 3 [IU] via SUBCUTANEOUS
  Administered 2018-03-27: 8 [IU] via SUBCUTANEOUS
  Administered 2018-03-27: 2 [IU] via SUBCUTANEOUS
  Administered 2018-03-28 – 2018-03-29 (×4): 3 [IU] via SUBCUTANEOUS
  Administered 2018-03-29: 2 [IU] via SUBCUTANEOUS
  Administered 2018-03-29: 8 [IU] via SUBCUTANEOUS
  Administered 2018-03-30: 5 [IU] via SUBCUTANEOUS
  Administered 2018-03-30: 3 [IU] via SUBCUTANEOUS
  Administered 2018-03-30: 5 [IU] via SUBCUTANEOUS
  Administered 2018-03-31 – 2018-04-01 (×4): 3 [IU] via SUBCUTANEOUS
  Administered 2018-04-01: 5 [IU] via SUBCUTANEOUS
  Administered 2018-04-01: 3 [IU] via SUBCUTANEOUS
  Administered 2018-04-02: 5 [IU] via SUBCUTANEOUS
  Administered 2018-04-02: 3 [IU] via SUBCUTANEOUS

## 2018-03-26 NOTE — ED Provider Notes (Signed)
[facility] Provider Note   CSN: [id number] Arrival date & time: 03/26/18  1604     History   Chief Complaint Chief Complaint  Patient presents with  . Shortness of Breath    HPI Morgan [name] is a 77 y.o. female.  Patient is a 77 year old female with a history of diabetes, hypertension, CHF, renal insufficiency, atrial flutter presenting today with worsening shortness of breath.  Daughter states she has been out of the hospital after having an episode of CHF for about 1 month.  She spent 20 days in rehab and then has been home about 2 weeks.  Since leaving the hospital they have had ongoing struggles with her fluid status and her blood sugars.  They are constantly adjusting her medications to maintain a good balance for her fluid.  She had been taking 40 mg of Lasix twice daily but it was drying her out too much and worsening her renal function.  About 2 weeks ago she was decreased to 40 mg daily however she saw her nephrologist last week and at that time was increased to a total of 60 mg a day.  Daughter states last night she started her to hearing some rattling in her chest and she seemed to be more short of breath.  It worsened today.  She has had worsening swelling in her lower extremities and 3 pound weight gain in the last few days.  Blood sugars also are ranging between 150-400.  She has a wet sounding cough that is nonproductive.  No fever, no confusion, chest pain or abdominal pain.  She denies vomiting or diarrhea.  Paramedics were called and upon arrival patient had an increased work of breathing and distress and was placed on BiPAP with significant improvement.  The history is provided by the patient and a relative.  Shortness of Breath  This is a recurrent problem. The average episode lasts 1 day. The problem occurs continuously.The current episode started 12 to 24 hours ago. The problem has been gradually worsening. Associated symptoms  include cough, orthopnea and leg swelling. Pertinent negatives include no fever, no sore throat, no sputum production, no wheezing, no chest pain, no vomiting, no abdominal pain and no leg pain. It is unknown what precipitated the problem. Treatments tried: increased lasix to 81m yesterday. The treatment provided no relief. She has had prior hospitalizations. She has had prior ED visits. Associated medical issues include COPD and heart failure.    Past Medical History:  Diagnosis Date  . Abnormal liver function tests   . Acquired autoimmune hypothyroidism   . Basal cell carcinoma    Chest  . Cancer (HCC)    Breast  . Combined hyperlipidemia   . COPD with acute exacerbation ([facility]   . Depression   . Diabetes mellitus   . Diabetes type 2, uncontrolled ([place]   . DM neuropathy with neurologic complication ([place]   . Fracture    Left wrist  . Goiter   . Gout   . History of gastroesophageal reflux (GERD)   . Hx of craniotomy 08/13/2017  . Hypertension   . Hypoglycemia associated with diabetes ([place]   . Meningioma ([place]   . Obesity   . Renal insufficiency   . Seizure ([place]   . Shoulder fracture, right 07/2015  . Sleep apnea, obstructive   . Thyroiditis, autoimmune   . Type II diabetes mellitus with peripheral angiopathy ([place]   . Vertigo     Patient Active Problem  List   Diagnosis Date Noted  . Depression   . Acute kidney injury ([place]) 02/12/2018  . Chronic diastolic heart failure ([place]) 02/11/2018  . Abnormality of gait 08/23/2016  . Transaminitis 05/22/2016  . CKD (chronic kidney disease) stage 4, GFR 15-29 ml/min (HCC) 05/22/2016  . Parkinsonism ([place]) 02/24/2016  . Seizure disorder ([place]) 04/27/2015  . Atrial flutter ([place]) 03/15/2015  . Mitral regurgitation 03/15/2015  . Cognitive impairment 07/23/2014  . Peripheral neuropathy with Parkisonian features   . Essential hypertension   . Gastroesophageal reflux disease without esophagitis   . History of Meningioma    . Primary  gout   . Left hemiparesis ([place])   . 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 Acquired autoimmune hypothyroidism   . Abnormal liver function tests   . Sleep apnea, obstructive   . Major depressive disorder, single episode   . Type 2 diabetes mellitus with polyneuropathy ([place])   . COPD (chronic obstructive pulmonary disease) ([place])   . Goiter   . Mixed hyperlipidemia 08/01/2010  . Obesity 08/01/2010    Past Surgical History:  Procedure Laterality Date  . APPENDECTOMY    . BACK SURGERY    . BASAL CELL CARCINOMA EXCISION     Chest  . CHOLECYSTECTOMY    . CRANIOTOMY Right 05/08/2014   Procedure: CRANIOTOMY FOR MENINGIOMA;  Surgeon: Ashok Pall, MD;  Location: [facility];  Service: Neurosurgery;  Laterality: Right;  Right Craniotomy for meningioma  . ESOPHAGOGASTRODUODENOSCOPY (EGD) WITH PROPOFOL N/A 05/22/2014   Procedure: ESOPHAGOGASTRODUODENOSCOPY (EGD) WITH PROPOFOL;  Surgeon: Wonda Horner, MD;  Location: [facility] ENDOSCOPY;  Service: Endoscopy;  Laterality: N/A;  . LAPAROSCOPIC GASTRIC BANDING    . MASTECTOMY Left      OB History   No obstetric history on file.      Home Medications    Prior to Admission medications   Medication Sig Start Date End Date Taking? Authorizing Provider  acetaminophen (TYLENOL) 325 MG tablet Take 650 mg by mouth every 6 (six) hours as needed for mild pain.     [provider]  albuterol (PROVENTIL) (2.5 MG/3ML) 0.083% nebulizer solution Take 3 mLs (2.5 mg total) by nebulization every 2 (two) hours as needed for shortness of breath. 05/26/16   Ghimire, Henreitta Leber, MD  ALPRAZolam Duanne Moron) 0.5 MG tablet Take 0.5 mg by mouth every 8 (eight) hours as needed for anxiety.    [provider]  amiodarone (PACERONE) 200 MG tablet Take 0.5 tablets (100 mg total) by mouth daily. TAKE 1/2 TABLET (200 MG TOTAL) BY MOUTH DAILY. 02/19/18   Seawell, Jaimie A, DO  amLODipine (NORVASC) 10 MG tablet Take 1 tablet (10 mg total) by mouth daily.  02/20/18   Seawell, Jaimie A, DO  ARIPiprazole (ABILIFY) 5 MG tablet Take 5 mg by mouth every evening.    [provider]  aspirin EC 81 MG tablet Take 1 tablet (81 mg total) by mouth daily. 01/28/18   [name], MD  budesonide-formoterol (SYMBICORT) 80-4.5 MCG/ACT inhaler Inhale 2 puffs into the lungs 2 (two) times daily. 07/05/16   [provider]  Calcium Carb-Cholecalciferol (CALCIUM 600+D) 600-800 MG-UNIT TABS Take 1 tablet by mouth daily.    [provider]  cloNIDine (CATAPRES - DOSED IN MG/24 HR) 0.3 mg/24hr patch Place 0.3 mg onto the skin every Friday.     [provider]  colchicine 0.6 MG tablet Take 0.6 mg by mouth daily as needed (for gout).     [provider]  DULoxetine (CYMBALTA) 60 MG capsule Take 60 mg by mouth 2 (two) times daily.     [provider]  furosemide (LASIX) 40 MG tablet Take 40 mg by mouth daily.     [provider]  HUMULIN R 500 UNIT/ML injection Inject 5-10 Units into the skin 3 (three) times daily with meals. Sliding scale:   NOTE:  Doses below are where U-100 syringe is drawn to; actually provides 5 times more insulin since using U-500 150 and below no Insulin  Breakfast  151-200 = 9 units 201-250= 10 units 251-300= 11 units 301-350=12 units 351-400= 13 units 4001-455= 14 units Above 450 = 15 units  Lunch  151-200= 8 units 201-250= 9 units 251-300= 10 units 301-350= 11 units 351-400= 12 units 401-450= 13 units Above 450 = 14 units  Supper 151-200= 4 units 201-250= 5 units 251-300= 6 units 301-350=7 units 351-400= 8 units 401-450= 9 units Above 450 = 10 units  NO INSULIN AFTER SUPPER, EVEN IF IT'S HIGH BECAUSE HER LEVELS WILL DROP DURING THE NIGHT. PLEASE GIVE A SNACK 01/26/17   [provider]  lamoTRIgine (LAMICTAL) 100 MG tablet Take 100 mg by mouth 2 (two) times daily.    [provider]  levothyroxine (SYNTHROID, LEVOTHROID) 200 MCG tablet Take 1  tablet (200 mcg total) by mouth daily before breakfast. Patient taking differently: Take 200 mcg by mouth See admin instructions. Take 2 tablets on Sunday then take 1 tablet on all other days 07/31/17   [name], MD  liraglutide (VICTOZA) 18 MG/3ML SOPN Inject 1.8 mg into the skin daily. Must take at with First dose if Inulin Must eat after 05/01/16   [provider]  lubiprostone (AMITIZA) 8 MCG capsule Take 8 mcg by mouth 2 (two) times daily with a meal.    [provider]  metoprolol tartrate (LOPRESSOR) 50 MG tablet Take 50 mg by mouth 2 (two) times daily. 01/22/17   [provider]  Multiple Vitamin (MULTIVITAMIN WITH MINERALS) TABS tablet Take 1 tablet by mouth daily.    [provider]  nitroGLYCERIN (NITROSTAT) 0.4 MG SL tablet Place 0.4 mg under the tongue every 5 (five) minutes as needed for chest pain.    [provider]  nystatin (MYCOSTATIN/NYSTOP) powder Apply 1 g topically daily as needed (irritation).    [provider]  pantoprazole (PROTONIX) 40 MG tablet Take 40 mg by mouth 2 (two) times daily.     [provider]  senna (SENOKOT) 8.6 MG TABS tablet Take 2 tablets by mouth at bedtime.     [provider]  vitamin B-12 (CYANOCOBALAMIN) 1000 MCG tablet Take 1,000 mcg by mouth daily.    [provider]    Family History Family History  Problem Relation Age of Onset  . Melanoma Mother   . Diabetes Father   . CAD Father   . Diabetes Sister   . Diabetes Brother     Social History Social History   Tobacco Use  . Smoking status: Never Smoker  . Smokeless tobacco: Never Used  Substance Use Topics  . Alcohol use: No  . Drug use: No     Allergies   Latex; Wellbutrin [bupropion]; Keppra [levetiracetam]; Statins; Byetta 10 mcg pen [exenatide]; Metformin; Penicillins; and Sulfa antibiotics   Review of Systems Review of Systems  Constitutional: Negative for fever.  HENT: Negative for  sore throat.   Respiratory: Positive for cough and shortness of breath. Negative for sputum production and wheezing.  Cardiovascular: Positive for orthopnea and leg swelling. Negative for chest pain.  Gastrointestinal: Negative for abdominal pain and vomiting.  All other systems reviewed and are negative.    Physical Exam Updated Vital Signs BP (!) 165/71   Pulse 64   Temp 98.8 F (37.1 C) (Axillary)   Resp 18   SpO2 100%   Physical Exam Vitals signs and nursing note reviewed.  Constitutional:      General: She is not in acute distress.    Appearance: She is well-developed.  HENT:     Head: Normocephalic and atraumatic.  Eyes:     Pupils: Pupils are equal, round, and reactive to light.  Cardiovascular:     Rate and Rhythm: Normal rate and regular rhythm.     Heart sounds: Normal heart sounds. No murmur. No friction rub.  Pulmonary:     Effort: Pulmonary effort is normal. Tachypnea present. No accessory muscle usage.     Breath sounds: Decreased air movement present. Examination of the right-lower field reveals rales. Examination of the left-lower field reveals rales. Rales present. No wheezing.  Abdominal:     General: Bowel sounds are normal. There is no distension.     Palpations: Abdomen is soft.     Tenderness: There is no abdominal tenderness. There is no guarding or rebound.  Musculoskeletal: Normal range of motion.        General: No tenderness.     Right lower leg: Edema present.     Left lower leg: Edema present.     Comments: No edema  Skin:    General: Skin is warm and dry.     Capillary Refill: Capillary refill takes 2 to 3 seconds.     Findings: No rash.  Neurological:     Mental Status: She is alert and oriented to person, place, and time.     Cranial Nerves: No cranial nerve deficit.     Comments: Patient is able to talk in short sentences and answer her medical questions appropriately  Psychiatric:        Behavior: Behavior normal.      ED  Treatments / Results  Labs (all labs ordered are listed, but only abnormal results are displayed) Labs Reviewed  CBC WITH DIFFERENTIAL/PLATELET - Abnormal; Notable for the following components:      Result Value   RBC 3.05 (*)    Hemoglobin 8.9 (*)    HCT 28.7 (*)    Abs Immature Granulocytes 0.12 (*)    All other components within normal limits  COMPREHENSIVE METABOLIC PANEL - Abnormal; Notable for the following components:   Glucose, Bld 147 (*)    BUN 30 (*)    Creatinine, Ser 2.06 (*)    Calcium 8.7 (*)    Albumin 3.0 (*)    AST 68 (*)    ALT 48 (*)    Alkaline Phosphatase 238 (*)    GFR calc non Af Amer 23 (*)    GFR calc Af Amer 26 (*)    All other components within normal limits  BRAIN NATRIURETIC PEPTIDE - Abnormal; Notable for the following components:   B Natriuretic Peptide 264.5 (*)    All other components within normal limits  I-STAT VENOUS BLOOD GAS, ED - Abnormal; Notable for the following components:   pO2, Ven 30.0 (*)    All other components within normal limits  I-STAT TROPONIN, ED    EKG EKG Interpretation  Date/Time:  Tuesday March 26 2018 16:20:01 EST Ventricular Rate:  69 PR Interval:    QRS Duration: 81 QT Interval:  391 QTC Calculation: 419 R Axis:   39 Text Interpretation:  Sinus rhythm Low voltage, precordial leads Probable anteroseptal infarct, old No significant change since last tracing Confirmed by Blanchie Dessert [phone]) on 03/26/2018 4:31:10 PM   Radiology Dg Chest Port 1 View  Result Date: 03/26/2018 CLINICAL DATA:  Two day history of increased shortness of breath, history of CHF EXAM: PORTABLE CHEST 1 VIEW COMPARISON:  Chest x-ray of 02/11/2017 FINDINGS: On this portable erect chest x-ray, the lungs are not well aerated with mild basilar volume loss. No pneumonia or pleural effusion is seen. No present evidence of congestive heart failure is noted. Moderate cardiomegaly is stable. A lower anterior cervical spine fusion plate is  present. Evidence of prior left shoulder surgery is noted with resection of the distal clavicle. IMPRESSION: 1. Poor inspiration.  No definite active process. 2. Stable moderate cardiomegaly. Electronically Signed   By: Ivar Drape M.D.   On: 03/26/2018 16:40    Procedures Procedures (including critical care time)  Medications Ordered in ED Medications - No data to display   Initial Impression / Assessment and Plan / ED Course  I have reviewed the triage vital signs and the nursing notes.  Pertinent labs & imaging results that were available during my care of the patient were reviewed by me and considered in my medical decision making (see chart for details).     Patient with multiple medical problems presenting today with symptoms most classic for CHF exacerbation.  She denies any chest pain, fever or productive cough.  She has been adjusting her Lasix dose over the last few weeks in recently 3 days ago increased it to 60 mg.  Patient does not complain of symptoms suggestive of PE, dissection or pneumonia.  She is afebrile here.  We will continue BiPAP at this time.  She has rales bilaterally. Will ensure no worsening renal function.  CBC, CMP, BNP, troponin, EKG, VBG, chest x-ray pending.  6:07 PM Patient's VBG is within normal limits, troponin within normal limits, hemoglobin of 8.9 which is down 1 g from her last however might be delusional.  BNP is 264 which is basically what it was the last time she came in with acute on chronic heart failure.  Last EF was 55 to 60% in November on her last echo.  Chest x-ray today shows cardiomegaly but no evidence of pleural effusions.  Patient's renal function is at baseline at 2.  She was given IV Lasix.  Will attempt to wean BiPAP.  Will admit for further care.  CRITICAL CARE Performed by: Chosen Garron Total critical care time: 30 minutes Critical care time was exclusive of separately billable procedures and treating other patients. Critical  care was necessary to treat or prevent imminent or life-threatening deterioration. Critical care was time spent personally by me on the following activities: development of treatment plan with patient and/or surrogate as well as nursing, discussions with consultants, evaluation of patient's response to treatment, examination of patient, obtaining history from patient or surrogate, ordering and performing treatments and interventions, ordering and review of laboratory studies, ordering and review of radiographic studies, pulse oximetry and re-evaluation of patient's condition.  Final Clinical Impressions(s) / ED Diagnoses   Final diagnoses:  Acute on chronic congestive heart failure, unspecified heart failure type [facility])    ED Discharge Orders    None       Blanchie Dessert, MD 03/26/18 [phone]

## 2018-03-26 NOTE — ED Notes (Signed)
purewick applied for pt comfort during diuresing.

## 2018-03-26 NOTE — H&P (Signed)
4       History and Physical    [name] XTG:[id number] DOB: 18-Oct-1940 DOA: 03/26/2018  PCP: [name]., MD   Patient coming from: Home.  I have personally briefly reviewed patient's old medical records in [place]  Chief Complaint: Shortness of breath.  HPI: Morgan [name] is a 77 y.o. female with medical history significant of abnormal LFTs, acquired autoimmune hypothyroidism, basal cell carcinoma of the chest, breast cancer, combined hyperlipidemia, COPD, depression, type 2 diabetes, diabetic peripheral neuropathy, goiter, gout, GERD, history of meningioma and craniotomy, hypertension, history of hypoglycemia associated with diabetes, obesity, renal insufficiency, history of seizures, right shoulder fracture, obstructive sleep apnea, vertigo who was brought to the emergency department due to Progressively worse shortness of breath for the past 2days.  She is unable to talk much at this time due to having the BiPAP mask.  However, her daughter states that a couple weeks back after she returned home from the skilled nursing facility, her nephrologist decreased her furosemide down to 40 mg once a day instead of 40 mg twice a day.  Since then, she has had some erythema and 2 days ago her furosemide was increased to 60 mg a day.  However, even with this change, the patient's edema has worsened and her shortness of breath is to the point that she is unable to perform any activity with getting dyspneic and fatigued very quickly.  She has had a nonproductive cough.  They deny chest pain, palpitations, dizziness, diaphoresis fever, chills,  constipation, melena or hematochezia.  She had an episode of abdominal pain last night and loose stools 2 days ago.  She denies dysuria, frequency or hematuria.  ED Course: Initial vital signs temperature 98.8 F pulse 79, respirations 22, blood pressure 183/77 mmHg and O2 sat 100% on BiPAP.  She was given furosemide 60 mg IVP x1 dose.  Venous  gas was normal except for a low PO2 at 30.0 mmHg.  White count was 6.8, hemoglobin 8.9 g/dL and platelets 203.  Troponin was normal.  BNP was 264.5 pg/mL.  Her CMP shows normal electrolytes when calcium is corrected to an albumin of 3.0 g/dL.  BUN was 30, creatinine 2.06, glucose 147 mg/dL.  Alkaline phosphatase 238, AST was 68 and ALT was 48.  EKG was sinus rhythm with low voltage, old anteroseptal infarct and no new changes.  Her chest radiograph shows stable moderate cardiomegaly.  Review of Systems: Unable to fully obtain due to BiPAP in use.  Past Medical History:  Diagnosis Date  . Abnormal liver function tests   . Acquired autoimmune hypothyroidism   . Basal cell carcinoma    Chest  . Cancer (HCC)    Breast  . Combined hyperlipidemia   . COPD with acute exacerbation ([place])   . Depression   . Diabetes mellitus   . Diabetes type 2, uncontrolled ([place])   . DM neuropathy with neurologic complication ([place])   . Fracture    Left wrist  . Goiter   . Gout   . History of gastroesophageal reflux (GERD)   . Hx of craniotomy 08/13/2017  . Hypertension   . Hypoglycemia associated with diabetes ([place])   . Meningioma ([place])   . Obesity   . Renal insufficiency   . Seizure ([place])   . Shoulder fracture, right 07/2015  . Sleep apnea, obstructive   . Thyroiditis, autoimmune   . Type II diabetes mellitus with peripheral angiopathy ([place])   . Vertigo  Past Surgical History:  Procedure Laterality Date  . APPENDECTOMY    . BACK SURGERY    . BASAL CELL CARCINOMA EXCISION     Chest  . CHOLECYSTECTOMY    . CRANIOTOMY Right 05/08/2014   Procedure: CRANIOTOMY FOR MENINGIOMA;  Surgeon: Ashok Pall, MD;  Location: [facility];  Service: Neurosurgery;  Laterality: Right;  Right Craniotomy for meningioma  . ESOPHAGOGASTRODUODENOSCOPY (EGD) WITH PROPOFOL N/A 05/22/2014   Procedure: ESOPHAGOGASTRODUODENOSCOPY (EGD) WITH PROPOFOL;  Surgeon: Wonda Horner, MD;  Location: [facility] ENDOSCOPY;  Service: Endoscopy;   Laterality: N/A;  . LAPAROSCOPIC GASTRIC BANDING    . MASTECTOMY Left      reports that she has never smoked. She has never used smokeless tobacco. She reports that she does not drink alcohol or use drugs.  Allergies  Allergen Reactions  . Latex Rash  . Wellbutrin [Bupropion] Other (See Comments)    Unable to stand.   . Keppra [Levetiracetam] Other (See Comments)    Dementia.  . Statins   . Byetta 10 Mcg Pen [Exenatide] Rash  . Metformin Rash    RENAL INSUFFICIENCY-takes low dose  . Penicillins Rash  . Sulfa Antibiotics Other (See Comments)    Unknown allergic reaction    Family History  Problem Relation Age of Onset  . Melanoma Mother   . Diabetes Father   . CAD Father   . Diabetes Sister   . Diabetes Brother    Prior to Admission medications   Medication Sig Start Date End Date Taking? Authorizing Provider  acetaminophen (TYLENOL) 325 MG tablet Take 650 mg by mouth every 6 (six) hours as needed for mild pain.    Yes [provider]  ALPRAZolam (XANAX) 0.5 MG tablet Take 0.5 mg by mouth every 8 (eight) hours as needed for anxiety.   Yes [provider]  amiodarone (PACERONE) 200 MG tablet Take 0.5 tablets (100 mg total) by mouth daily. TAKE 1/2 TABLET (200 MG TOTAL) BY MOUTH DAILY. 02/19/18  Yes Seawell, Jaimie A, DO  amLODipine (NORVASC) 10 MG tablet Take 1 tablet (10 mg total) by mouth daily. 02/20/18  Yes Seawell, Jaimie A, DO  ARIPiprazole (ABILIFY) 5 MG tablet Take 5 mg by mouth every evening.   Yes [provider]  aspirin EC 81 MG tablet Take 1 tablet (81 mg total) by mouth daily. 01/28/18  Yes [name], MD  budesonide-formoterol (SYMBICORT) 80-4.5 MCG/ACT inhaler Inhale 2 puffs into the lungs 2 (two) times daily. 07/05/16  Yes [provider]  Calcium Carb-Cholecalciferol (CALCIUM 600+D) 600-800 MG-UNIT TABS Take 1 tablet by mouth daily.   Yes [provider]  cloNIDine (CATAPRES - DOSED IN MG/24 HR) 0.3 mg/24hr  patch Place 0.3 mg onto the skin every Friday.    Yes [provider]  colchicine 0.6 MG tablet Take 0.6 mg by mouth daily as needed (for gout).    Yes [provider]  DULoxetine (CYMBALTA) 60 MG capsule Take 60 mg by mouth 2 (two) times daily.    Yes [provider]  furosemide (LASIX) 40 MG tablet Take 40 mg by mouth 2 (two) times daily.    Yes [provider]  HUMULIN R 500 UNIT/ML injection Inject 5-10 Units into the skin 3 (three) times daily with meals. Sliding scale:   NOTE:  Doses below are where U-100 syringe is drawn to; actually provides 5 times more insulin since using U-500 150 and below no Insulin  Breakfast  151-200 = 9 units 201-250=  10 units 251-300= 11 units 301-350=12 units 351-400= 13 units 4001-455= 14 units Above 450 = 15 units  Lunch  151-200= 8 units 201-250= 9 units 251-300= 10 units 301-350= 11 units 351-400= 12 units 401-450= 13 units Above 450 = 14 units  Supper 151-200= 4 units 201-250= 5 units 251-300= 6 units 301-350=7 units 351-400= 8 units 401-450= 9 units Above 450 = 10 units  NO INSULIN AFTER SUPPER, EVEN IF IT'S HIGH BECAUSE HER LEVELS WILL DROP DURING THE NIGHT. PLEASE GIVE A SNACK 01/26/17  Yes [provider]  lamoTRIgine (LAMICTAL) 100 MG tablet Take 100 mg by mouth 2 (two) times daily.   Yes [provider]  levothyroxine (SYNTHROID, LEVOTHROID) 200 MCG tablet Take 1 tablet (200 mcg total) by mouth daily before breakfast. Patient taking differently: Take 200 mcg by mouth See admin instructions. Take 2 tablets on Sunday then take 1 tablet on all other days 07/31/17  Yes Crenshaw, Denice Bors, MD  liraglutide (VICTOZA) 18 MG/3ML SOPN Inject 1.8 mg into the skin daily. Must take at with First dose if Inulin Must eat after 05/01/16  Yes [provider]  lubiprostone (AMITIZA) 8 MCG capsule Take 8 mcg by mouth 2 (two) times daily with a meal.   Yes [provider]    metoprolol tartrate (LOPRESSOR) 50 MG tablet Take 25 mg by mouth 2 (two) times daily.  01/22/17  Yes [provider]  Multiple Vitamin (MULTIVITAMIN WITH MINERALS) TABS tablet Take 1 tablet by mouth daily.   Yes [provider]  nitroGLYCERIN (NITROSTAT) 0.4 MG SL tablet Place 0.4 mg under the tongue every 5 (five) minutes as needed for chest pain.   Yes [provider]  nystatin (MYCOSTATIN/NYSTOP) powder Apply 1 g topically daily as needed (irritation).   Yes [provider]  nystatin ointment (MYCOSTATIN) Apply 1 application topically 2 (two) times daily. 03/11/18  Yes [provider]  pantoprazole (PROTONIX) 40 MG tablet Take 40 mg by mouth 2 (two) times daily.    Yes [provider]  senna (SENOKOT) 8.6 MG TABS tablet Take 2 tablets by mouth at bedtime.    Yes [provider]  vitamin B-12 (CYANOCOBALAMIN) 1000 MCG tablet Take 1,000 mcg by mouth daily.   Yes [provider]  albuterol (PROVENTIL) (2.5 MG/3ML) 0.083% nebulizer solution Take 3 mLs (2.5 mg total) by nebulization every 2 (two) hours as needed for shortness of breath. Patient not taking: Reported on 03/26/2018 05/26/16   Jonetta Osgood, MD    Physical Exam: Vitals:   03/26/18 1616 03/26/18 1618 03/26/18 1716 03/26/18 1830  BP:  (!) 183/77 (!) 165/71 (!) 182/72  Pulse: 70 79 64 64  Resp:  (!) 22 18 (!) 24  Temp:  98.8 F (37.1 C)    TempSrc:  Axillary    SpO2: 100% 100% 100% 100%    Constitutional: NAD, calm, comfortable Eyes: PERRL, lids and conjunctivae normal ENMT: Bipap mask in place. Mucous membranes are dry. Posterior pharynx clear of any exudate or lesions. Neck: normal, supple, no masses, no thyromegaly Respiratory: Bibasilar rales, no wheezing. Normal respiratory effort. No accessory muscle use.  Cardiovascular: Regular rate and rhythm, no murmurs / rubs / gallops.  There is palpebral/facial and bilateral hands pitting edema. 2+ pedal  pulses. No carotid bruits.  Abdomen: no tenderness, no masses palpated. No hepatosplenomegaly. Bowel sounds positive.  Musculoskeletal: no clubbing / cyanosis. Good ROM, no contractures. Normal muscle tone.  Skin: Positive erythema on abdominal and inguinal area  folds. Neurologic: CN 2-12 grossly intact. Sensation intact, DTR normal. Strength 5/5 in all 4.  Psychiatric:  Alert and oriented x 4.  Mildly anxious mood.   Labs on Admission: I have personally reviewed following labs and imaging studies  CBC: Recent Labs  Lab 03/26/18 1615  WBC 6.8  NEUTROABS 4.6  HGB 8.9*  HCT 28.7*  MCV 94.1  PLT 287   Basic Metabolic Panel: Recent Labs  Lab 03/26/18 1615  NA 136  K 4.7  CL 103  CO2 23  GLUCOSE 147*  BUN 30*  CREATININE 2.06*  CALCIUM 8.7*   GFR: CrCl cannot be calculated (Unknown ideal weight.). Liver Function Tests: Recent Labs  Lab 03/26/18 1615  AST 68*  ALT 48*  ALKPHOS 238*  BILITOT 0.9  PROT 6.6  ALBUMIN 3.0*   No results for input(s): LIPASE, AMYLASE in the last 168 hours. No results for input(s): AMMONIA in the last 168 hours. Coagulation Profile: No results for input(s): INR, PROTIME in the last 168 hours. Cardiac Enzymes: No results for input(s): CKTOTAL, CKMB, CKMBINDEX, TROPONINI in the last 168 hours. BNP (last 3 results) No results for input(s): PROBNP in the last 8760 hours. HbA1C: No results for input(s): HGBA1C in the last 72 hours. CBG: No results for input(s): GLUCAP in the last 168 hours. Lipid Profile: No results for input(s): CHOL, HDL, LDLCALC, TRIG, CHOLHDL, LDLDIRECT in the last 72 hours. Thyroid Function Tests: No results for input(s): TSH, T4TOTAL, FREET4, T3FREE, THYROIDAB in the last 72 hours. Anemia Panel: No results for input(s): VITAMINB12, FOLATE, FERRITIN, TIBC, IRON, RETICCTPCT in the last 72 hours. Urine analysis:    Component Value Date/Time   COLORURINE YELLOW 02/10/2018 1550   APPEARANCEUR CLEAR 02/10/2018 1550    LABSPEC 1.014 02/10/2018 1550   PHURINE 7.0 02/10/2018 1550   GLUCOSEU 50 (A) 02/10/2018 1550   HGBUR NEGATIVE 02/10/2018 1550   BILIRUBINUR NEGATIVE 02/10/2018 1550   KETONESUR NEGATIVE 02/10/2018 1550   PROTEINUR >=300 (A) 02/10/2018 1550   UROBILINOGEN 0.2 01/15/2015 2141   NITRITE NEGATIVE 02/10/2018 1550   LEUKOCYTESUR NEGATIVE 02/10/2018 1550    Radiological Exams on Admission: Dg Chest Port 1 View  Result Date: 03/26/2018 CLINICAL DATA:  Two day history of increased shortness of breath, history of CHF EXAM: PORTABLE CHEST 1 VIEW COMPARISON:  Chest x-ray of 02/11/2017 FINDINGS: On this portable erect chest x-ray, the lungs are not well aerated with mild basilar volume loss. No pneumonia or pleural effusion is seen. No present evidence of congestive heart failure is noted. Moderate cardiomegaly is stable. A lower anterior cervical spine fusion plate is present. Evidence of prior left shoulder surgery is noted with resection of the distal clavicle. IMPRESSION: 1. Poor inspiration.  No definite active process. 2. Stable moderate cardiomegaly. Electronically Signed   By: Ivar Drape M.D.   On: 03/26/2018 16:40   02/12/2018 echocardiogram complete  ------------------------------------------------------------------- LV EF: 55% -   60%  ------------------------------------------------------------------- Indications:      CHF - 428.0.  ------------------------------------------------------------------- History:   PMH:  CKD  Atrial flutter.  Chronic obstructive pulmonary disease.  Risk factors:  Hypertension. Diabetes mellitus.   ------------------------------------------------------------------- Study Conclusions  - Left ventricle: The cavity size was normal. Wall thickness was   increased in a pattern of mild LVH. Systolic function was normal.   The estimated ejection fraction was in the range of 55% to 60%.   Wall motion was normal; there were no regional wall motion    abnormalities. Features are consistent  with a pseudonormal left   ventricular filling pattern, with concomitant abnormal relaxation   and increased filling pressure (grade 2 diastolic dysfunction). - Aortic valve: There was trivial regurgitation. - Left atrium: The atrium was mildly dilated. - Pulmonary arteries: Systolic pressure was mildly to moderately   increased. PA peak pressure: 47 mm Hg (S).  Impressions:  - Normal LV systolic function; mild LVH; moderate diastolic   dysfunction; trace AI; mild LAE; trace TR; mild to moderate   pulmonary hypertension.  EKG: Independently reviewed.  Vent. rate 69 BPM PR interval * ms QRS duration 81 ms QT/QTc 391/419 ms P-R-T axes * 39 32 Sinus rhythm Low voltage, precordial leads Probable anteroseptal infarct, old No significant change since previous EKG  Assessment/Plan Principal Problem:   Acute on chronic diastolic congestive heart failure (HCC) Observation/progressive unit. Continue supplemental oxygen and BiPAP ventilation. Continue furosemide 40 mg IVP twice a day. Bronchodilators as needed for COPD. Monitor daily weights, intake and output. Monitor renal function and electrolytes.  Active Problems:   Atrial flutter (HCC) CHA2DVAS2C score of 4. Continue amiodarone 100 mg p.o. daily. Continue metoprolol 25 mg p.o. twice daily. Not on anticoagulation given history of meningioma. She also has a history of frequent falls.    Mixed hyperlipidemia Would not start statin given transaminitis.    Abnormal liver function tests On amiodarone and duloxetine. Monitor transaminases closely.    Type 2 diabetes mellitus with polyneuropathy (HCC) Carbohydrate modified diet. CBG monitoring with regular insulin sliding scale.    COPD (chronic obstructive pulmonary disease) (HCC) Continue BiPAP ventilation. Continue bronchodilators as needed.    Hypothyroidism, acquired, autoimmune Continue level thyroxine 200 mcg p.o.  daily.    Essential hypertension Continue amlodipine 10 mg p.o. daily. Continue clonidine patch. Continue furosemide. Continue metoprolol 25 mg p.o. twice daily. Monitor blood pressure, HR, renal function and electrolytes.    Gastroesophageal reflux disease without esophagitis On Protonix.    Constipation Continue Amitiza 8 mcg p.o. twice daily. Continue Senokot at bedtime.    Depression Continue Cymbalta 60 mg p.o. twice daily. Continue Abilify 5 mg p.o. every evening. Continue Lamictal 100 mg p.o. twice daily. Alprazolam as needed.    History of Meningioma and preoperative seizures History of resection of large frontal meningioma in January 2016 with postoperative course complicated by bowel obstruction and a 10 month rehabilitation stay. Has not had further seizures postoperatively. On Lamotrigine  100 mg p.o. twice daily.  She was on Keppra before.      DVT prophylaxis: Heparin SQ. Code Status: DNR. Family Communication: Her daughter was present in the the room. Disposition Plan: Observation for BiPAP ventilation and diuresis. Consults called: Admission status: Observation/telemetry.   Reubin Milan MD [facility]  If 7PM-7AM, please contact night-coverage [email or web address] Password [email or web address]  03/26/2018, 7:00 PM

## 2018-03-26 NOTE — ED Triage Notes (Signed)
Pt presents with 2 day h/o increased shortness of breath; pt just discharged from [facility] for CHF.  Pt unable to perform any activity w/o shortness of breath.  Rales reported, pt on BIPAP

## 2018-03-26 NOTE — Progress Notes (Signed)
Pt transported from ED34 to 4E15 on BIPAP without incidence.

## 2018-03-27 DIAGNOSIS — N183 Chronic kidney disease, stage 3 (moderate): Secondary | ICD-10-CM | POA: Diagnosis present

## 2018-03-27 DIAGNOSIS — R296 Repeated falls: Secondary | ICD-10-CM | POA: Diagnosis present

## 2018-03-27 DIAGNOSIS — J410 Simple chronic bronchitis: Secondary | ICD-10-CM

## 2018-03-27 DIAGNOSIS — I509 Heart failure, unspecified: Secondary | ICD-10-CM

## 2018-03-27 DIAGNOSIS — F329 Major depressive disorder, single episode, unspecified: Secondary | ICD-10-CM | POA: Diagnosis present

## 2018-03-27 DIAGNOSIS — Z981 Arthrodesis status: Secondary | ICD-10-CM | POA: Diagnosis not present

## 2018-03-27 DIAGNOSIS — Z85828 Personal history of other malignant neoplasm of skin: Secondary | ICD-10-CM | POA: Diagnosis not present

## 2018-03-27 DIAGNOSIS — Z833 Family history of diabetes mellitus: Secondary | ICD-10-CM | POA: Diagnosis not present

## 2018-03-27 DIAGNOSIS — I4892 Unspecified atrial flutter: Secondary | ICD-10-CM | POA: Diagnosis not present

## 2018-03-27 DIAGNOSIS — R945 Abnormal results of liver function studies: Secondary | ICD-10-CM | POA: Diagnosis not present

## 2018-03-27 DIAGNOSIS — Z794 Long term (current) use of insulin: Secondary | ICD-10-CM | POA: Diagnosis not present

## 2018-03-27 DIAGNOSIS — E063 Autoimmune thyroiditis: Secondary | ICD-10-CM

## 2018-03-27 DIAGNOSIS — Z79899 Other long term (current) drug therapy: Secondary | ICD-10-CM | POA: Diagnosis not present

## 2018-03-27 DIAGNOSIS — E1151 Type 2 diabetes mellitus with diabetic peripheral angiopathy without gangrene: Secondary | ICD-10-CM | POA: Diagnosis present

## 2018-03-27 DIAGNOSIS — Z86011 Personal history of benign neoplasm of the brain: Secondary | ICD-10-CM | POA: Diagnosis not present

## 2018-03-27 DIAGNOSIS — I130 Hypertensive heart and chronic kidney disease with heart failure and stage 1 through stage 4 chronic kidney disease, or unspecified chronic kidney disease: Secondary | ICD-10-CM | POA: Diagnosis present

## 2018-03-27 DIAGNOSIS — I10 Essential (primary) hypertension: Secondary | ICD-10-CM | POA: Diagnosis not present

## 2018-03-27 DIAGNOSIS — J449 Chronic obstructive pulmonary disease, unspecified: Secondary | ICD-10-CM | POA: Diagnosis present

## 2018-03-27 DIAGNOSIS — R0602 Shortness of breath: Secondary | ICD-10-CM | POA: Diagnosis present

## 2018-03-27 DIAGNOSIS — Z7951 Long term (current) use of inhaled steroids: Secondary | ICD-10-CM | POA: Diagnosis not present

## 2018-03-27 DIAGNOSIS — N179 Acute kidney failure, unspecified: Secondary | ICD-10-CM | POA: Diagnosis present

## 2018-03-27 DIAGNOSIS — J438 Other emphysema: Secondary | ICD-10-CM | POA: Diagnosis not present

## 2018-03-27 DIAGNOSIS — Z7982 Long term (current) use of aspirin: Secondary | ICD-10-CM | POA: Diagnosis not present

## 2018-03-27 DIAGNOSIS — K5900 Constipation, unspecified: Secondary | ICD-10-CM | POA: Diagnosis present

## 2018-03-27 DIAGNOSIS — I5033 Acute on chronic diastolic (congestive) heart failure: Secondary | ICD-10-CM | POA: Diagnosis present

## 2018-03-27 DIAGNOSIS — Z8249 Family history of ischemic heart disease and other diseases of the circulatory system: Secondary | ICD-10-CM | POA: Diagnosis not present

## 2018-03-27 DIAGNOSIS — E1142 Type 2 diabetes mellitus with diabetic polyneuropathy: Secondary | ICD-10-CM | POA: Diagnosis present

## 2018-03-27 DIAGNOSIS — Z6841 Body Mass Index (BMI) 40.0 and over, adult: Secondary | ICD-10-CM | POA: Diagnosis not present

## 2018-03-27 DIAGNOSIS — E782 Mixed hyperlipidemia: Secondary | ICD-10-CM | POA: Diagnosis present

## 2018-03-27 DIAGNOSIS — K219 Gastro-esophageal reflux disease without esophagitis: Secondary | ICD-10-CM

## 2018-03-27 DIAGNOSIS — E1122 Type 2 diabetes mellitus with diabetic chronic kidney disease: Secondary | ICD-10-CM | POA: Diagnosis present

## 2018-03-27 DIAGNOSIS — Z853 Personal history of malignant neoplasm of breast: Secondary | ICD-10-CM | POA: Diagnosis not present

## 2018-03-27 DIAGNOSIS — Z79890 Hormone replacement therapy (postmenopausal): Secondary | ICD-10-CM | POA: Diagnosis not present

## 2018-03-27 LAB — GLUCOSE, CAPILLARY
GLUCOSE-CAPILLARY: 106 mg/dL — AB (ref 70–99)
Glucose-Capillary: 146 mg/dL — ABNORMAL HIGH (ref 70–99)
Glucose-Capillary: 262 mg/dL — ABNORMAL HIGH (ref 70–99)
Glucose-Capillary: 189 mg/dL — ABNORMAL HIGH (ref 70–99)
Glucose-Capillary: 239 mg/dL — ABNORMAL HIGH (ref 70–99)

## 2018-03-27 LAB — BASIC METABOLIC PANEL
Anion gap: 14 (ref 5–15)
BUN: 27 mg/dL — ABNORMAL HIGH (ref 8–23)
CO2: 23 mmol/L (ref 22–32)
Calcium: 8.7 mg/dL — ABNORMAL LOW (ref 8.9–10.3)
Chloride: 102 mmol/L (ref 98–111)
Creatinine, Ser: 1.99 mg/dL — ABNORMAL HIGH (ref 0.44–1.00)
GFR calc Af Amer: 27 mL/min — ABNORMAL LOW (ref >60)
GFR calc non Af Amer: 24 mL/min — ABNORMAL LOW (ref >60)
Glucose, Bld: 115 mg/dL — ABNORMAL HIGH (ref 70–99)
Potassium: 4.5 mmol/L (ref 3.5–5.1)
Sodium: 139 mmol/L (ref 135–145)

## 2018-03-27 LAB — TROPONIN I
Troponin I: <0.03 ng/mL (ref <0.03)
Troponin I: <0.03 ng/mL (ref <0.03)

## 2018-03-27 MED ORDER — CHLORHEXIDINE GLUCONATE 0.12 % MT SOLN
15.0000 mL | Freq: Two times a day (BID) | OROMUCOSAL | Status: DC
  Administered 2018-03-27 – 2018-03-31 (×10): 15 mL via OROMUCOSAL
  Filled 2018-03-27 (×11): qty 15

## 2018-03-27 MED ORDER — DOCUSATE SODIUM 100 MG PO CAPS
100.0000 mg | ORAL_CAPSULE | Freq: Two times a day (BID) | ORAL | Status: DC
  Administered 2018-03-27 – 2018-04-01 (×10): 100 mg via ORAL
  Filled 2018-03-27 (×12): qty 1

## 2018-03-27 MED ORDER — FUROSEMIDE 10 MG/ML IJ SOLN
40.0000 mg | Freq: Two times a day (BID) | INTRAMUSCULAR | Status: DC
  Administered 2018-03-27 – 2018-03-28 (×2): 40 mg via INTRAVENOUS
  Filled 2018-03-27 (×2): qty 4

## 2018-03-27 MED ORDER — ORAL CARE MOUTH RINSE
15.0000 mL | Freq: Two times a day (BID) | OROMUCOSAL | Status: DC
  Administered 2018-03-27 – 2018-03-31 (×8): 15 mL via OROMUCOSAL

## 2018-03-27 MED ORDER — LIRAGLUTIDE 18 MG/3ML ~~LOC~~ SOPN
1.8000 mg | PEN_INJECTOR | Freq: Every day | SUBCUTANEOUS | Status: DC
  Administered 2018-03-27 – 2018-04-02 (×7): 1.8 mg via SUBCUTANEOUS
  Filled 2018-03-27 (×8): qty 3

## 2018-03-27 NOTE — Progress Notes (Signed)
@  1157 Paged Raliegh Ip Schorr of [facility] regarding BPs (160s/60s) and pt's inability to take POs 2/2 Bi-Pap use. Page promptly returned: ordered to monitor BPs without intervention unless they increased further. Will continue to follow and convey to Day RN.

## 2018-03-27 NOTE — Progress Notes (Signed)
PROGRESS NOTE    Morgan [name]  LXB:[id number] DOB: 1940-07-04 DOA: 03/26/2018 PCP: [name]., MD   Brief Narrative:  Morgan [name] is a 77 y.o. female with medical history significant of abnormal LFTs, acquired autoimmune hypothyroidism, basal cell carcinoma of the chest, breast cancer, combined hyperlipidemia, COPD, depression, type 2 diabetes, diabetic peripheral neuropathy, goiter, gout, GERD, history of meningioma and craniotomy, hypertension, history of hypoglycemia associated with diabetes, obesity, renal insufficiency, history of seizures, right shoulder fracture, obstructive sleep apnea, vertigo who was brought to the emergency department due to Progressively worse shortness of breath for the past 2 days.  He was admitted for management of acute on chronic diastolic heart failure.   Assessment & Plan:   Principal Problem:   Acute on chronic diastolic congestive heart failure (HCC) Active Problems:   Mixed hyperlipidemia   Abnormal liver function tests   Type 2 diabetes mellitus with polyneuropathy (HCC)   COPD (chronic obstructive pulmonary disease) (HCC)   Hypothyroidism, acquired, autoimmune   Essential hypertension   Gastroesophageal reflux disease without esophagitis   Atrial flutter (HCC)   Depression  Acute on chronic diastolic heart failure Admitted for diuresis with IV Lasix twice daily. Continue with oxygen as needed to keep sats greater than 90%.   BiPAP as needed.  Continue to monitor daily weights with strict intake and output.  Watch renal para meters and potassium while on IV Lasix.  Cardiology consult will be consulted in in the morning.  Recent echocardiogram last month Wall thickness was   increased in a pattern of mild LVH. Systolic function was normal.   The estimated ejection fraction was in the range of 55% to 60%.   Wall motion was normal; there were no regional wall motion   abnormalities. Features are consistent with a pseudonormal left  ventricular filling pattern, with concomitant abnormal relaxation   and increased filling pressure (grade 2 diastolic dysfunction). - Aortic valve: There was trivial regurgitation.   History of atrial flutter Continue with amiodarone and metoprolol.  Patient is not on anticoagulation given history of meningioma and frequent falls.  History of COPD Bronchodilators as needed.    Mildly elevated liver enzymes Unclear etiology versus amiodarone toxicity. We will repeat liver panel in 48 hours.   Hyperlipidemia Statin on hold given transaminitis.    Hypertension Well-controlled Resume home medications at this time. .  Constipation Resume Senokot and Amitiza.   Hypothyroidism Acquired and autoimmune. Continue with Synthroid 200 MCG daily.  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 Monitor BMP in the morning.    Depression Continue with Cymbalta Abilify and Lamictal.     History of meningioma and seizures. Status post resection of large frontal meningioma in January 2016.  On lamotrigine 800 mg twice daily..  DVT prophylaxis: On heparin subcu Code Status: DNR Family Communication discussed with daughter at bedside  disposition Plan: Pending PT evaluation.   Consultants:   Cardiology will be consulted in the morning.   Procedures: None  Antimicrobials: None  Subjective: Patient reports her breathing has improved but she is not back to baseline yet she denies chest pain, nausea or vomiting.  She has constipation  Objective: Vitals:   03/27/18 0250 03/27/18 0500 03/27/18 1121 03/27/18 1140  BP: (!) 159/62   (!) 162/60  Pulse: (!) 59   71  Resp: 19   (!) 27  Temp: 97.9 F (36.6 C)  TempSrc: Oral     SpO2: 99%  96% 94%  Weight:  108.2 kg    Height:        Intake/Output Summary (Last 24 hours) at 03/27/2018 1732 Last data filed at 03/27/2018 0600 Gross per 24 hour  Intake 0  ml  Output 1550 ml  Net -1550 ml   Filed Weights   03/26/18 1945 03/27/18 0500  Weight: 110.2 kg 108.2 kg    Examination:  General exam: Mild distress from shortness of breath Respiratory system: Diminished at bases, tachypnea present, scattered rails are present Cardiovascular system: S1 & S2 heard, RRR. No JVD, Gastrointestinal system: Abdomen is soft, mildly tender in the mid section, bowel sounds are heard.  Abdomen is nondistended. Central nervous system: Alert and oriented to person only Extremities: Trace pedal edema Skin: No rashes, lesions or ulcers Psychiatry: Mood & affect appropriate.     Data Reviewed: I have personally reviewed following labs and imaging studies  CBC: Recent Labs  Lab 03/26/18 1615  WBC 6.8  NEUTROABS 4.6  HGB 8.9*  HCT 28.7*  MCV 94.1  PLT 027   Basic Metabolic Panel: Recent Labs  Lab 03/26/18 1615 03/27/18 0238  NA 136 139  K 4.7 4.5  CL 103 102  CO2 23 23  GLUCOSE 147* 115*  BUN 30* 27*  CREATININE 2.06* 1.99*  CALCIUM 8.7* 8.7*   GFR: Estimated Creatinine Clearance: 28.4 mL/min (A) (by C-G formula based on SCr of 1.99 mg/dL (H)). Liver Function Tests: Recent Labs  Lab 03/26/18 1615  AST 68*  ALT 48*  ALKPHOS 238*  BILITOT 0.9  PROT 6.6  ALBUMIN 3.0*   No results for input(s): LIPASE, AMYLASE in the last 168 hours. No results for input(s): AMMONIA in the last 168 hours. Coagulation Profile: No results for input(s): INR, PROTIME in the last 168 hours. Cardiac Enzymes: Recent Labs  Lab 03/26/18 2237 03/27/18 0238 03/27/18 1037  TROPONINI <0.03 <0.03 <0.03   BNP (last 3 results) No results for input(s): PROBNP in the last 8760 hours. HbA1C: No results for input(s): HGBA1C in the last 72 hours. CBG: Recent Labs  Lab 03/26/18 2204 03/27/18 0245 03/27/18 0605 03/27/18 1136 03/27/18 1725  GLUCAP 92 106* 146* 262* 189*   Lipid Profile: No results for input(s): CHOL, HDL, LDLCALC, TRIG, CHOLHDL,  LDLDIRECT in the last 72 hours. Thyroid Function Tests: No results for input(s): TSH, T4TOTAL, FREET4, T3FREE, THYROIDAB in the last 72 hours. Anemia Panel: No results for input(s): VITAMINB12, FOLATE, FERRITIN, TIBC, IRON, RETICCTPCT in the last 72 hours. Sepsis Labs: No results for input(s): PROCALCITON, LATICACIDVEN in the last 168 hours.  No results found for this or any previous visit (from the past 240 hour(s)).       Radiology Studies: Dg Chest Port 1 View  Result Date: 03/26/2018 CLINICAL DATA:  Two day history of increased shortness of breath, history of CHF EXAM: PORTABLE CHEST 1 VIEW COMPARISON:  Chest x-ray of 02/11/2017 FINDINGS: On this portable erect chest x-ray, the lungs are not well aerated with mild basilar volume loss. No pneumonia or pleural effusion is seen. No present evidence of congestive heart failure is noted. Moderate cardiomegaly is stable. A lower anterior cervical spine fusion plate is present. Evidence of prior left shoulder surgery is noted with resection of the distal clavicle. IMPRESSION: 1. Poor inspiration.  No definite active process. 2. Stable moderate cardiomegaly. Electronically Signed   By: Ivar Drape M.D.   On: 03/26/2018 16:40  Scheduled Meds: . amiodarone  100 mg Oral Daily  . amLODipine  10 mg Oral Daily  . ARIPiprazole  5 mg Oral QPM  . aspirin EC  81 mg Oral Daily  . chlorhexidine  15 mL Mouth Rinse BID  . [START ON 03/29/2018] cloNIDine  0.3 mg Transdermal Q Fri  . DULoxetine  60 mg Oral BID  . heparin  5,000 Units Subcutaneous Q8H  . insulin aspart  0-15 Units Subcutaneous TID WC  . lamoTRIgine  100 mg Oral BID  . levothyroxine  200 mcg Oral QAC breakfast  . liraglutide  1.8 mg Subcutaneous Daily  . lubiprostone  8 mcg Oral BID WC  . mouth rinse  15 mL Mouth Rinse q12n4p  . metoprolol tartrate  25 mg Oral BID  . mometasone-formoterol  2 puff Inhalation BID  . nystatin   Topical TID  . pantoprazole  40 mg Oral BID  .  senna  2 tablet Oral QHS  . vitamin B-12  1,000 mcg Oral Daily   Continuous Infusions:   LOS: 0 days    Time spent: 36 minutes    Hosie Poisson, MD [facility] Pager [phone]  If 7PM-7AM, please contact night-coverage [email or web address] Password [facility] 03/27/2018, 5:32 PM

## 2018-03-27 NOTE — Care Management Obs Status (Signed)
Green NOTIFICATION   Patient Details  Name: REILEY BERTAGNOLLI MRN: [id number] Date of Birth: June 13, 1940   Medicare Observation Status Notification Given:  Yes    Dawayne Patricia, RN 03/27/2018, 4:01 PM

## 2018-03-27 NOTE — Progress Notes (Signed)
@  approx. 0250 at pt's request, pt removed from Bi-Pap and placed on 2L humidified [place]. Pt breathing unlabored and regular with no signs of distress. Will continue to monitor.

## 2018-03-27 NOTE — Progress Notes (Signed)
Pt. Refused bipap for tonight. 

## 2018-03-28 ENCOUNTER — Other Ambulatory Visit: Payer: Self-pay

## 2018-03-28 DIAGNOSIS — I5033 Acute on chronic diastolic (congestive) heart failure: Secondary | ICD-10-CM

## 2018-03-28 DIAGNOSIS — I10 Essential (primary) hypertension: Secondary | ICD-10-CM

## 2018-03-28 DIAGNOSIS — I4892 Unspecified atrial flutter: Secondary | ICD-10-CM

## 2018-03-28 LAB — GLUCOSE, CAPILLARY
Glucose-Capillary: 164 mg/dL — ABNORMAL HIGH (ref 70–99)
Glucose-Capillary: 182 mg/dL — ABNORMAL HIGH (ref 70–99)
Glucose-Capillary: 179 mg/dL — ABNORMAL HIGH (ref 70–99)
Glucose-Capillary: 174 mg/dL — ABNORMAL HIGH (ref 70–99)

## 2018-03-28 LAB — BASIC METABOLIC PANEL
Anion gap: 11 (ref 5–15)
BUN: 30 mg/dL — ABNORMAL HIGH (ref 8–23)
CO2: 24 mmol/L (ref 22–32)
Calcium: 8.8 mg/dL — ABNORMAL LOW (ref 8.9–10.3)
Chloride: 102 mmol/L (ref 98–111)
Creatinine, Ser: 1.99 mg/dL — ABNORMAL HIGH (ref 0.44–1.00)
GFR calc Af Amer: 27 mL/min — ABNORMAL LOW (ref >60)
GFR calc non Af Amer: 24 mL/min — ABNORMAL LOW (ref >60)
Glucose, Bld: 151 mg/dL — ABNORMAL HIGH (ref 70–99)
Potassium: 4.3 mmol/L (ref 3.5–5.1)
Sodium: 137 mmol/L (ref 135–145)

## 2018-03-28 MED ORDER — GUAIFENESIN-DM 100-10 MG/5ML PO SYRP: 5.0000 mL | ORAL_SOLUTION | ORAL | Status: DC | PRN

## 2018-03-28 MED ORDER — FUROSEMIDE 10 MG/ML IJ SOLN
80.0000 mg | Freq: Two times a day (BID) | INTRAMUSCULAR | Status: DC
  Administered 2018-03-28 – 2018-03-29 (×3): 80 mg via INTRAVENOUS
  Filled 2018-03-28 (×3): qty 8

## 2018-03-28 MED ORDER — CARVEDILOL 12.5 MG PO TABS
12.5000 mg | ORAL_TABLET | Freq: Two times a day (BID) | ORAL | Status: DC
  Administered 2018-03-28 – 2018-04-02 (×10): 12.5 mg via ORAL
  Filled 2018-03-28 (×10): qty 1

## 2018-03-28 MED ORDER — BENZONATATE 100 MG PO CAPS
100.0000 mg | ORAL_CAPSULE | Freq: Three times a day (TID) | ORAL | Status: DC | PRN
  Administered 2018-04-02 (×2): 100 mg via ORAL
  Filled 2018-03-28 (×2): qty 1

## 2018-03-28 NOTE — Progress Notes (Addendum)
PROGRESS NOTE    Morgan [name]  NUU:[id number] DOB: 1940/10/11 DOA: 03/26/2018 PCP: [name]., MD   Brief Narrative:  SUNDAI [name] is a 77 y.o. female with medical history significant of abnormal LFTs, acquired autoimmune hypothyroidism, basal cell carcinoma of the chest, breast cancer, combined hyperlipidemia, COPD, depression, type 2 diabetes, diabetic peripheral neuropathy, goiter, gout, GERD, history of meningioma and craniotomy, hypertension, history of hypoglycemia associated with diabetes, obesity, renal insufficiency, history of seizures, right shoulder fracture, obstructive sleep apnea, vertigo who was brought to the emergency department due to Progressively worse shortness of breath for the past 2 days.  He was admitted for management of acute on chronic diastolic heart failure.   Assessment & Plan:   Principal Problem:   Acute on chronic diastolic congestive heart failure (HCC) Active Problems:   Mixed hyperlipidemia   Abnormal liver function tests   Type 2 diabetes mellitus with polyneuropathy (HCC)   COPD (chronic obstructive pulmonary disease) (HCC)   Hypothyroidism, acquired, autoimmune   Essential hypertension   Gastroesophageal reflux disease without esophagitis   Atrial flutter (HCC)   Depression   CHF (congestive heart failure) (HCC)  Acute on chronic diastolic heart failure Admitted for diuresis with IV Lasix twice daily. Cardiology increased the dose to 80 mg BID.  Continue with oxygen as needed to keep sats greater than 90%.   BiPAP as needed.  Continue to monitor daily weights with strict intake and output.  Watch renal para meters and potassium while on IV Lasix.   Recent echocardiogram last month Wall thickness was   increased in a pattern of mild LVH. Systolic function was normal.   The estimated ejection fraction was in the range of 55% to 60%.   Wall motion was normal; there were no regional wall motion   abnormalities. Features are consistent  with a pseudonormal left   ventricular filling pattern, with concomitant abnormal relaxation   and increased filling pressure (grade 2 diastolic dysfunction). - Aortic valve: There was trivial regurgitation.   History of atrial flutter In sinus rhythm.  Continue with amiodarone and metoprolol.  Patient is not on anticoagulation given history of meningioma and frequent falls.  History of COPD Bronchodilators as needed.    Mildly elevated liver enzymes Unclear etiology versus amiodarone toxicity. We will repeat liver panel in 48 hours.   Hyperlipidemia Statin on hold given transaminitis.    Hypertension SUB OPTIMALLY controlled. Change metoprolol to coreg.  Resume home medications at this time. .  Constipation Resume Senokot and Amitiza.   Hypothyroidism Acquired and autoimmune. Continue with Synthroid 200 MCG daily.  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 Creatinine stable on IV lasix.     Depression Continue with Cymbalta Abilify and Lamictal.     History of meningioma and seizures. Status post resection of large frontal meningioma in January 2016.  On lamotrigine 800 mg twice daily..  DVT prophylaxis: On heparin subcu Code Status: DNR Family Communication family at bedside today.  disposition Plan: Pending PT evaluation.   Consultants:   Cardiology will be consulted in the morning.   Procedures: None  Antimicrobials: None  Subjective: Continues to improve, but requiring oxygen.  Still requires IV lasix for diuresis.   Objective: Vitals:   03/28/18 0818 03/28/18 0819 03/28/18 1120 03/28/18 1611  BP:   (!) 176/67 (!) 183/73  Pulse:   72 73  Resp:   (!) 23 17  Temp:  97.8 F (36.6 C) 97.7 F (36.5 C)  TempSrc:   Oral Oral  SpO2: 99% 99% 98% 99%  Weight:      Height:        Intake/Output Summary (Last 24 hours) at 03/28/2018 1638 Last data filed at 03/28/2018  1300 Gross per 24 hour  Intake 780 ml  Output 3400 ml  Net -2620 ml   Filed Weights   03/26/18 1945 03/27/18 0500 03/28/18 0500  Weight: 110.2 kg 108.2 kg 108.1 kg    Examination:  General exam: Mild distress from shortness of breath and tachypnea.  Respiratory system: Diminished at bases, tachypnea present, scattered rales are present Cardiovascular system: S1 & S2 heard, RRR. No JVD, Gastrointestinal system: Abdomen is soft NT ND BS+ Central nervous system: Alert and oriented to person only Extremities: Trace pedal edema Skin: No rashes, lesions or ulcers Psychiatry: Mood & affect appropriate.     Data Reviewed: I have personally reviewed following labs and imaging studies  CBC: Recent Labs  Lab 03/26/18 1615  WBC 6.8  NEUTROABS 4.6  HGB 8.9*  HCT 28.7*  MCV 94.1  PLT 264   Basic Metabolic Panel: Recent Labs  Lab 03/26/18 1615 03/27/18 0238 03/28/18 0250  NA 136 139 137  K 4.7 4.5 4.3  CL 103 102 102  CO2 23 23 24   GLUCOSE 147* 115* 151*  BUN 30* 27* 30*  CREATININE 2.06* 1.99* 1.99*  CALCIUM 8.7* 8.7* 8.8*   GFR: Estimated Creatinine Clearance: 28.4 mL/min (A) (by C-G formula based on SCr of 1.99 mg/dL (H)). Liver Function Tests: Recent Labs  Lab 03/26/18 1615  AST 68*  ALT 48*  ALKPHOS 238*  BILITOT 0.9  PROT 6.6  ALBUMIN 3.0*   No results for input(s): LIPASE, AMYLASE in the last 168 hours. No results for input(s): AMMONIA in the last 168 hours. Coagulation Profile: No results for input(s): INR, PROTIME in the last 168 hours. Cardiac Enzymes: Recent Labs  Lab 03/26/18 2237 03/27/18 0238 03/27/18 1037  TROPONINI <0.03 <0.03 <0.03   BNP (last 3 results) No results for input(s): PROBNP in the last 8760 hours. HbA1C: No results for input(s): HGBA1C in the last 72 hours. CBG: Recent Labs  Lab 03/27/18 1725 03/27/18 2053 03/28/18 0612 03/28/18 1122 03/28/18 1609  GLUCAP 189* 239* 164* 182* 179*   Lipid Profile: No results for  input(s): CHOL, HDL, LDLCALC, TRIG, CHOLHDL, LDLDIRECT in the last 72 hours. Thyroid Function Tests: No results for input(s): TSH, T4TOTAL, FREET4, T3FREE, THYROIDAB in the last 72 hours. Anemia Panel: No results for input(s): VITAMINB12, FOLATE, FERRITIN, TIBC, IRON, RETICCTPCT in the last 72 hours. Sepsis Labs: No results for input(s): PROCALCITON, LATICACIDVEN in the last 168 hours.  No results found for this or any previous visit (from the past 240 hour(s)).       Radiology Studies: No results found.      Scheduled Meds: . amiodarone  100 mg Oral Daily  . amLODipine  10 mg Oral Daily  . ARIPiprazole  5 mg Oral QPM  . aspirin EC  81 mg Oral Daily  . carvedilol  12.5 mg Oral BID WC  . chlorhexidine  15 mL Mouth Rinse BID  . [START ON 03/29/2018] cloNIDine  0.3 mg Transdermal Q Fri  . docusate sodium  100 mg Oral BID  . DULoxetine  60 mg Oral BID  . furosemide  80 mg Intravenous BID  . heparin  5,000 Units Subcutaneous Q8H  . insulin aspart  0-15 Units  Subcutaneous TID WC  . lamoTRIgine  100 mg Oral BID  . levothyroxine  200 mcg Oral QAC breakfast  . liraglutide  1.8 mg Subcutaneous Daily  . lubiprostone  8 mcg Oral BID WC  . mouth rinse  15 mL Mouth Rinse q12n4p  . mometasone-formoterol  2 puff Inhalation BID  . nystatin   Topical TID  . pantoprazole  40 mg Oral BID  . senna  2 tablet Oral QHS  . vitamin B-12  1,000 mcg Oral Daily   Continuous Infusions:   LOS: 1 day    Time spent: 36 minutes    Hosie Poisson, MD [facility] Pager [phone]  If 7PM-7AM, please contact night-coverage [email or web address] Password [facility] 03/28/2018, 4:38 PM

## 2018-03-28 NOTE — Evaluation (Signed)
Physical Therapy Evaluation Patient Details Name: Morgan [name] MRN: [id number] DOB: November 01, 1940 Today's Date: 03/28/2018   History of Present Illness  Pt with acute on chronic heart failure. PMH- chronic diastolic heart failure EF 65-70%, atrial flutter, arthrosclerotic CAD, CKD III/IV, pulmonary HTN, obstructive sleep apnea, DM2, hypothyroidism, nonalcoholic steatohepatitis, seizure disorder, emphysema, major depressive disorder, meningioma s/p right frontal craniotomy, breast cancer s/p left mastectomy and abnormal gait presenting for evaluation of SOB and anasarca.  Of note, pt recent psych admit due to suicidal ideation.   Clinical Impression  Pt admitted with above diagnosis and presents to PT with functional limitations due to deficits listed below (See PT problem list). Pt needs skilled PT to maximize independence and safety to allow discharge to ST-SNF.      Follow Up Recommendations SNF    Equipment Recommendations  None recommended by PT    Recommendations for Other Services       Precautions / Restrictions Precautions Precautions: Fall Restrictions Weight Bearing Restrictions: No      Mobility  Bed Mobility Overal bed mobility: Needs Assistance Bed Mobility: Supine to Sit;Sit to Supine     Supine to sit: Min assist Sit to supine: Min assist   General bed mobility comments: Assist to elevate trunk and assist to bring legs back up into bed  Transfers Overall transfer level: Needs assistance Equipment used: 4-wheeled walker Transfers: Sit to/from Stand Sit to Stand: Min assist         General transfer comment: assist to bring hips up and for balance  Ambulation/Gait Ambulation/Gait assistance: Min assist Gait Distance (Feet): 100 Feet Assistive device: 4-wheeled walker Gait Pattern/deviations: Step-through pattern;Decreased stride length;Trunk flexed Gait velocity: decr Gait velocity interpretation: <1.31 ft/sec, indicative of household  ambulator General Gait Details: Assist for balance. Pt with 2 standing rest breaks. Amb on RA with dyspnea 3/4 and SpO2 98%  Stairs            Wheelchair Mobility    Modified Rankin (Stroke Patients Only)       Balance Overall balance assessment: Needs assistance Sitting-balance support: No upper extremity supported;Feet supported Sitting balance-Leahy Scale: Good     Standing balance support: Single extremity supported Standing balance-Leahy Scale: Poor Standing balance comment: Ue support                             Pertinent Vitals/Pain Pain Assessment: No/denies pain    Home Living Family/patient expects to be discharged to:: Skilled nursing facility                 Additional Comments: Family and aid in and out but not 24/7    Prior Function Level of Independence: Needs assistance   Gait / Transfers Assistance Needed: Uses a rollator  ADL's / Homemaking Assistance Needed: Requires A in and out of shower and some dressing  Comments: sleeps in lift chair     Hand Dominance   Dominant Hand: Right    Extremity/Trunk Assessment   Upper Extremity Assessment Upper Extremity Assessment: Generalized weakness    Lower Extremity Assessment Lower Extremity Assessment: Generalized weakness       Communication   Communication: No difficulties  Cognition Arousal/Alertness: Awake/alert Behavior During Therapy: WFL for tasks assessed/performed Overall Cognitive Status: Within Functional Limits for tasks assessed  General Comments      Exercises     Assessment/Plan    PT Assessment Patient needs continued PT services  PT Problem List Decreased strength;Decreased activity tolerance;Decreased balance;Decreased mobility;Cardiopulmonary status limiting activity       PT Treatment Interventions DME instruction;Gait training;Functional mobility training;Therapeutic  activities;Therapeutic exercise;Balance training;Patient/family education    PT Goals (Current goals can be found in the Care Plan section)  Acute Rehab PT Goals Patient Stated Goal: return home PT Goal Formulation: With patient/family Time For Goal Achievement: 04/11/18 Potential to Achieve Goals: Good    Frequency Min 2X/week   Barriers to discharge Decreased caregiver support lives alone    Co-evaluation               AM-PAC PT "6 Clicks" Mobility  Outcome Measure Help needed turning from your back to your side while in a flat bed without using bedrails?: A Little Help needed moving from lying on your back to sitting on the side of a flat bed without using bedrails?: A Little Help needed moving to and from a bed to a chair (including a wheelchair)?: A Little Help needed standing up from a chair using your arms (e.g., wheelchair or bedside chair)?: A Little Help needed to walk in hospital room?: A Little Help needed climbing 3-5 steps with a railing? : A Lot 6 Click Score: 17    End of Session Equipment Utilized During Treatment: Gait belt Activity Tolerance: Patient limited by fatigue Patient left: in bed;with call bell/phone within reach;with family/visitor present Nurse Communication: Mobility status PT Visit Diagnosis: Unsteadiness on feet (R26.81);Other abnormalities of gait and mobility (R26.89)    Time: 1535-1550 PT Time Calculation (min) (ACUTE ONLY): 15 min   Charges:   PT Evaluation $PT Eval Moderate Complexity: [place] Pager [phone] Office [place] 03/28/2018, 4:27 PM

## 2018-03-28 NOTE — Consult Note (Signed)
Cardiology Consultation:   Patient ID: Morgan [name]; [id number]; 1940/06/15   Admit date: 03/26/2018 Date of Consult: 03/28/2018  Primary Care Provider: [name]., MD Primary Cardiologist: Dr. Phill Mutter, MD   Patient Profile:   Morgan [name] is a 77 y.o. female with a hx of acute on chronic diastolic HF with normal LV function and G2 DD, atrial flutter, DM 2, CKD stage III with partial left nephrectomy about 9 years ago, HTN, abnormal LFTs, autoimmune thyroiditis, COPD, GERD, history of meningioma and craniotomy who is being seen today for the evaluation of acute on chronic diastolic CHF exacerbation at the request of Dr. Karleen Hampshire.  History of Present Illness:   Morgan [name] is a 77yo with a hx as stated above who presented to [facility] on 03/27/18 with c/o progressively worsening shortness of breath and LE swelling which acutely worsened approximately two days prior to admission, however has been ongoing for about two months. She states that she was recently hospitalized and discharged to a skilled nursing facility in which she stayed for two weeks for intense rehabilitation. During that time she felt that her breathing was better. She was then released home under the care of her daughter, son and home health caregivers. She reports since rehab discharge, she can no longer ambulate from her living area to the bathroom without becoming severely SOB and fatigued. He son is at bedside and states that he has seen a constant decline over the short course. She denies chest pain, palpitations, dizziness, diaphoresis, nausea, vomiting, recent fever or chills, presyncopal or syncopal episodes. She reports that she was weighting herself daily but her scale recently broke. She denies diet and fluid indiscretions. She has been taking her medications as prescribed.   After rehab discharge, she was seen by her nephrologist on 03/18/2018 for hospital/rehab follow-up in which she was admitted to [facility] with  shortness of breath, increased swelling a uncontrolled hypertension from 02/09/18 to 02/19/18.  Her hospital course was complicated by AKI in the setting of CHF exacerbation and diuresis. She was noted to have gained approximately 9lbs since her hospital discharge and had increased LE swelling and shortness of breath during that visit. She was previously on Lasix 40 mg twice daily (was on 20mg  prior to 01/2018) however, between her hospital admission, office visits and the SNF stay, her stable baseline dose was unknown. During her hospital course, there was underlying concern for over diuresis and AKI, therefore it seems that this is where her Lasix was reduced. Plan was to increase Lasix to 40 mg AM and 20 mg PM, strict low-sodium and 50 ounce fluid restrict diet with plans to return to clinic in 6 weeks for further evaluation.  After this appointment, she reports that her swelling and SOB did not improve. She had her daughter call the nephrologist office for assistance in which they recommended to increase her lasix further to 40mg  BID. Unfortunately, later that evening, is when she became acutely worse and was transported to the ED for further evaluation.    In the ED, EKG revealed NSR with no acute ischemic changes. Troponin levels have ben negative x3 at <0.03, <0.03, <0.03. BNP was 264.5. Her creatinine was elevated at 1.99 with a baseline of 1.2-2.5. CXR performed with no acute cardiopulmomary process however there was evidence of poor aeration, no PNA, no pleural effusion, and no evidence of CHF exacerbation. Given her poor presenting respiratory status, she was placed on Bipap ventilation with improvement. He was  given IV Lasix 60mg  once in the ED with good results.   She was last seen by her primary cardiologist on 01/28/2018 for follow-up of atrial flutter and HF. She was initially seen after an AF episode in which she underwent a nuclear study which showed an ejection fraction of 66% and no ischemia  or infarction.  She was treated with metoprolol, diltiazem and amiodarone and converted spontaneously on her own.  At a previous office visit, Eliquis was discontinued secondary to previous meningioma resection and frequent falls.  Primary cardiologist has discussed possible atrial flutter ablation however given her chronic illnesses, the patient and family wishes for conservative measures only including amiodarone.  He had an echocardiogram completed 06/2016 which showed normal LV function, moderate left ventricular hypertrophy and G1 DD with mild tricuspid regurgitation.  At her last follow-up visit, she did have reports of dyspnea on exertion however reported no orthopnea.  She also had evidence of pedal edema but had not been taking full dose Lasix and was not following a low-sodium diet.  Plan was to resume Lasix 40 mg daily for mild fluid volume overload.  She was noted to be in normal sinus rhythm with plans to continue amiodarone.  Her anticoagulation was not resumed secondary to above.  She was started on ASA 81 mg daily.  Past Medical History:  Diagnosis Date  . Abnormal liver function tests   . Acquired autoimmune hypothyroidism   . Basal cell carcinoma    Chest  . Cancer (HCC)    Breast  . Combined hyperlipidemia   . COPD with acute exacerbation ([place])   . Depression   . Diabetes mellitus   . Diabetes type 2, uncontrolled ([place])   . DM neuropathy with neurologic complication ([place])   . Fracture    Left wrist  . Goiter   . Gout   . History of gastroesophageal reflux (GERD)   . Hx of craniotomy 08/13/2017  . Hypertension   . Hypoglycemia associated with diabetes ([place])   . Meningioma ([place])   . Obesity   . Renal insufficiency   . Seizure ([place])   . Shoulder fracture, right 07/2015  . Sleep apnea, obstructive   . Thyroiditis, autoimmune   . Type II diabetes mellitus with peripheral angiopathy ([place])   . Vertigo     Past Surgical History:  Procedure Laterality Date  . APPENDECTOMY     . BACK SURGERY    . BASAL CELL CARCINOMA EXCISION     Chest  . CHOLECYSTECTOMY    . CRANIOTOMY Right 05/08/2014   Procedure: CRANIOTOMY FOR MENINGIOMA;  Surgeon: Ashok Pall, MD;  Location: [place] NEURO ORS;  Service: Neurosurgery;  Laterality: Right;  Right Craniotomy for meningioma  . ESOPHAGOGASTRODUODENOSCOPY (EGD) WITH PROPOFOL N/A 05/22/2014   Procedure: ESOPHAGOGASTRODUODENOSCOPY (EGD) WITH PROPOFOL;  Surgeon: Wonda Horner, MD;  Location[facility] ENDOSCOPY;  Service: Endoscopy;  Laterality: N/A;  . LAPAROSCOPIC GASTRIC BANDING    . MASTECTOMY Left      Prior to Admission medications   Medication Sig Start Date End Date Taking? Authorizing Provider  acetaminophen (TYLENOL) 325 MG tablet Take 650 mg by mouth every 6 (six) hours as needed for mild pain.    Yes [provider]  ALPRAZolam (XANAX) 0.5 MG tablet Take 0.5 mg by mouth every 8 (eight) hours as needed for anxiety.   Yes [provider]  amiodarone (PACERONE) 200 MG tablet Take 0.5 tablets (100 mg total) by mouth daily. TAKE 1/2 TABLET (200 MG TOTAL)  BY MOUTH DAILY. 02/19/18  Yes Seawell, Jaimie A, DO  amLODipine (NORVASC) 10 MG tablet Take 1 tablet (10 mg total) by mouth daily. 02/20/18  Yes Seawell, Jaimie A, DO  ARIPiprazole (ABILIFY) 5 MG tablet Take 5 mg by mouth every evening.   Yes [provider]  aspirin EC 81 MG tablet Take 1 tablet (81 mg total) by mouth daily. 01/28/18  Yes [name], MD  budesonide-formoterol (SYMBICORT) 80-4.5 MCG/ACT inhaler Inhale 2 puffs into the lungs 2 (two) times daily. 07/05/16  Yes [provider]  Calcium Carb-Cholecalciferol (CALCIUM 600+D) 600-800 MG-UNIT TABS Take 1 tablet by mouth daily.   Yes [provider]  cloNIDine (CATAPRES - DOSED IN MG/24 HR) 0.3 mg/24hr patch Place 0.3 mg onto the skin every Friday.    Yes [provider]  colchicine 0.6 MG tablet Take 0.6 mg by mouth daily as needed (for gout).    Yes [provider]  DULoxetine (CYMBALTA) 60 MG capsule Take 60 mg by mouth 2 (two) times daily.    Yes [provider]  furosemide (LASIX) 40 MG tablet Take 40 mg by mouth 2 (two) times daily.    Yes [provider]  HUMULIN R 500 UNIT/ML injection Inject 5-10 Units into the skin 3 (three) times daily with meals. Sliding scale:   NOTE:  Doses below are where U-100 syringe is drawn to; actually provides 5 times more insulin since using U-500 150 and below no Insulin  Breakfast  151-200 = 9 units 201-250= 10 units 251-300= 11 units 301-350=12 units 351-400= 13 units 4001-455= 14 units Above 450 = 15 units  Lunch  151-200= 8 units 201-250= 9 units 251-300= 10 units 301-350= 11 units 351-400= 12 units 401-450= 13 units Above 450 = 14 units  Supper 151-200= 4 units 201-250= 5 units 251-300= 6 units 301-350=7 units 351-400= 8 units 401-450= 9 units Above 450 = 10 units  NO INSULIN AFTER SUPPER, EVEN IF IT'S HIGH BECAUSE HER LEVELS WILL DROP DURING THE NIGHT. PLEASE GIVE A SNACK 01/26/17  Yes [provider]  lamoTRIgine (LAMICTAL) 100 MG tablet Take 100 mg by mouth 2 (two) times daily.   Yes [provider]  levothyroxine (SYNTHROID, LEVOTHROID) 200 MCG tablet Take 1 tablet (200 mcg total) by mouth daily before breakfast. Patient taking differently: Take 200 mcg by mouth See admin instructions. Take 2 tablets on Sunday then take 1 tablet on all other days 07/31/17  Yes Crenshaw, Denice Bors, MD  liraglutide (VICTOZA) 18 MG/3ML SOPN Inject 1.8 mg into the skin daily. Must take at with First dose if Inulin Must eat after 05/01/16  Yes [provider]  lubiprostone (AMITIZA) 8 MCG capsule Take 8 mcg by mouth 2 (two) times daily with a meal.   Yes [provider]  metoprolol tartrate (LOPRESSOR) 50 MG tablet Take 25 mg by mouth 2 (two) times daily.  01/22/17  Yes [provider]  Multiple Vitamin (MULTIVITAMIN WITH MINERALS) TABS tablet Take 1  tablet by mouth daily.   Yes [provider]  nitroGLYCERIN (NITROSTAT) 0.4 MG SL tablet Place 0.4 mg under the tongue every 5 (five) minutes as needed for chest pain.   Yes [provider]  nystatin (MYCOSTATIN/NYSTOP) powder Apply 1 g topically daily as needed (irritation).   Yes [provider]  nystatin ointment (MYCOSTATIN) Apply 1 application topically 2 (two) times daily. 03/11/18  Yes [provider]  pantoprazole (PROTONIX) 40 MG tablet Take 40 mg by  mouth 2 (two) times daily.    Yes [provider]  senna (SENOKOT) 8.6 MG TABS tablet Take 2 tablets by mouth at bedtime.    Yes [provider]  vitamin B-12 (CYANOCOBALAMIN) 1000 MCG tablet Take 1,000 mcg by mouth daily.   Yes [provider]  albuterol (PROVENTIL) (2.5 MG/3ML) 0.083% nebulizer solution Take 3 mLs (2.5 mg total) by nebulization every 2 (two) hours as needed for shortness of breath. Patient not taking: Reported on 03/26/2018 05/26/16   Jonetta Osgood, MD    Inpatient Medications: Scheduled Meds: . amiodarone  100 mg Oral Daily  . amLODipine  10 mg Oral Daily  . ARIPiprazole  5 mg Oral QPM  . aspirin EC  81 mg Oral Daily  . chlorhexidine  15 mL Mouth Rinse BID  . [START ON 03/29/2018] cloNIDine  0.3 mg Transdermal Q Fri  . docusate sodium  100 mg Oral BID  . DULoxetine  60 mg Oral BID  . furosemide  40 mg Intravenous Q12H  . heparin  5,000 Units Subcutaneous Q8H  . insulin aspart  0-15 Units Subcutaneous TID WC  . lamoTRIgine  100 mg Oral BID  . levothyroxine  200 mcg Oral QAC breakfast  . liraglutide  1.8 mg Subcutaneous Daily  . lubiprostone  8 mcg Oral BID WC  . mouth rinse  15 mL Mouth Rinse q12n4p  . metoprolol tartrate  25 mg Oral BID  . mometasone-formoterol  2 puff Inhalation BID  . nystatin   Topical TID  . pantoprazole  40 mg Oral BID  . senna  2 tablet Oral QHS  . vitamin B-12  1,000 mcg Oral Daily   Continuous Infusions:  PRN  Meds: acetaminophen **OR** acetaminophen, ALPRAZolam, benzonatate, colchicine, guaiFENesin-dextromethorphan, ipratropium-albuterol, nitroGLYCERIN, ondansetron **OR** ondansetron (ZOFRAN) IV  Allergies:    Allergies  Allergen Reactions  . Latex Rash  . Wellbutrin [Bupropion] Other (See Comments)    Unable to stand.   . Keppra [Levetiracetam] Other (See Comments)    Dementia.  . Statins   . Byetta 10 Mcg Pen [Exenatide] Rash  . Metformin Rash    RENAL INSUFFICIENCY-takes low dose  . Penicillins Rash  . Sulfa Antibiotics Other (See Comments)    Unknown allergic reaction    Social History:   Social History   Socioeconomic History  . Marital status: Married    Spouse name: Konrad Dolores  . Number of children: 2  . Years of education: 41  . Highest education level: Not on file  Occupational History  . Occupation: Retired  Scientific laboratory technician  . Financial resource strain: Not on file  . Food insecurity:    Worry: Not on file    Inability: Not on file  . Transportation needs:    Medical: Not on file    Non-medical: Not on file  Tobacco Use  . Smoking status: Never Smoker  . Smokeless tobacco: Never Used  Substance and Sexual Activity  . Alcohol use: No  . Drug use: No  . Sexual activity: Never  Lifestyle  . Physical activity:    Days per week: Not on file    Minutes per session: Not on file  . Stress: Not on file  Relationships  . Social connections:    Talks on phone: Not on file    Gets together: Not on file    Attends religious service: Not on file    Active member of club or organization: Not on file    Attends meetings of  clubs or organizations: Not on file    Relationship status: Not on file  . Intimate partner violence:    Fear of current or ex partner: Not on file    Emotionally abused: Not on file    Physically abused: Not on file    Forced sexual activity: Not on file  Other Topics Concern  . Not on file  Social History Narrative   02/13/17 living at home with  spouse   Right-handed.   No caffeine use.    Family History:   Family History  Problem Relation Age of Onset  . Melanoma Mother   . Diabetes Father   . CAD Father   . Diabetes Sister   . Diabetes Brother    Family Status:  Family Status  Relation Name Status  . Mother  Deceased  . Father  Deceased  . Sister  Alive  . Brother  Alive    ROS:  Please see the history of present illness.  All other ROS reviewed and negative.     Physical Exam/Data:   Vitals:   03/28/18 0755 03/28/18 0803 03/28/18 0818 03/28/18 0819  BP: (!) 175/68 (!) 168/58    Pulse: 64 74    Resp: (!) 24     Temp: 97.8 F (36.6 C)     TempSrc: Oral     SpO2: 100%  99% 99%  Weight:      Height:        Intake/Output Summary (Last 24 hours) at 03/28/2018 1102 Last data filed at 03/28/2018 1013 Gross per 24 hour  Intake 960 ml  Output 3200 ml  Net -2240 ml   Filed Weights   03/26/18 1945 03/27/18 0500 03/28/18 0500  Weight: 110.2 kg 108.2 kg 108.1 kg   Body mass index is 40.91 kg/m.   General: Obese, well developed, well nourished, NAD Skin: Warm, dry, intact  Head: Normocephalic, atraumatic,  clear, moist mucus membranes. Neck: Negative for carotid bruits. No JVD Lungs: Bilateral lower lobe crackles. Breathing is unlabored. Cardiovascular: RRR with S1 S2. No murmurs, rubs, gallops, or LV heave appreciated. Abdomen: Soft, distended with normoactive bowel sounds. No obvious abdominal masses. MSK: Strength and tone appear normal for age. 5/5 in all extremities Extremities: BUE 1+ edema. No clubbing or cyanosis. DP/PT pulses 1+ bilaterally Neuro: Alert and oriented. No focal deficits. No facial asymmetry. MAE spontaneously. Psych: Responds to questions appropriately with flat affect.     EKG:  The EKG was personally reviewed and demonstrates: 03/27/18 Telemetry:  Telemetry was personally reviewed and demonstrates: 03/27/18 NSR HR 60's   Relevant CV Studies:  ECHO: 02/12/2018: Study  Conclusions  - Left ventricle: The cavity size was normal. Wall thickness was increased in a pattern of mild LVH. Systolic function was normal. The estimated ejection fraction was in the range of 55% to 60%. Wall motion was normal; there were no regional wall motion abnormalities. Features are consistent with a pseudonormal left ventricular filling pattern, with concomitant abnormal relaxation and increased filling pressure (grade 2 diastolic dysfunction). - Aortic valve: There was trivial regurgitation. - Left atrium: The atrium was mildly dilated. - Pulmonary arteries: Systolic pressure was mildly to moderately increased. PA peak pressure: 47 mm Hg (S).  Impressions:  - Normal LV systolic function; mild LVH; moderate diastolic dysfunction; trace AI; mild LAE; trace TR; mild to moderate pulmonary hypertension  CATH: None   Laboratory Data:  Chemistry Recent Labs  Lab 03/26/18 1615 03/27/18 0238 03/28/18 0250  NA 136 139 137  K 4.7 4.5 4.3  CL 103 102 102  CO2 23 23 24   GLUCOSE 147* 115* 151*  BUN 30* 27* 30*  CREATININE 2.06* 1.99* 1.99*  CALCIUM 8.7* 8.7* 8.8*  GFRNONAA 23* 24* 24*  GFRAA 26* 27* 27*  ANIONGAP 10 14 11     Total Protein  Date Value Ref Range Status  03/26/2018 6.6 6.5 - 8.1 g/dL Final  07/30/2017 6.8 6.0 - 8.5 g/dL Final   Albumin  Date Value Ref Range Status  03/26/2018 3.0 (L) 3.5 - 5.0 g/dL Final  07/30/2017 4.1 3.5 - 4.8 g/dL Final   AST  Date Value Ref Range Status  03/26/2018 68 (H) 15 - 41 U/L Final   ALT  Date Value Ref Range Status  03/26/2018 48 (H) 0 - 44 U/L Final   Alkaline Phosphatase  Date Value Ref Range Status  03/26/2018 238 (H) 38 - 126 U/L Final   Total Bilirubin  Date Value Ref Range Status  03/26/2018 0.9 0.3 - 1.2 mg/dL Final   Bilirubin Total  Date Value Ref Range Status  07/30/2017 0.5 0.0 - 1.2 mg/dL Final   Hematology Recent Labs  Lab 03/26/18 1615  WBC 6.8  RBC 3.05*  HGB  8.9*  HCT 28.7*  MCV 94.1  MCH 29.2  MCHC 31.0  RDW 14.4  PLT 203   Cardiac Enzymes Recent Labs  Lab 03/26/18 2237 03/27/18 0238 03/27/18 1037  TROPONINI <0.03 <0.03 <0.03    Recent Labs  Lab 03/26/18 1631  TROPIPOC 0.01    BNP Recent Labs  Lab 03/26/18 1615  BNP 264.5*    DDimer No results for input(s): DDIMER in the last 168 hours. TSH:  Lab Results  Component Value Date   TSH 1.505 02/11/2018   Lipids: Lab Results  Component Value Date   CHOL  05/10/2009    132        ATP III CLASSIFICATION:  <200     mg/dL   Desirable  200-239  mg/dL   Borderline High  >=240    mg/dL   High          TRIG 288 (H) 05/25/2014   HgbA1c: Lab Results  Component Value Date   HGBA1C 6.5 (H) 02/11/2018    Radiology/Studies:  Dg Chest Port 1 View  Result Date: 03/26/2018 CLINICAL DATA:  Two day history of increased shortness of breath, history of CHF EXAM: PORTABLE CHEST 1 VIEW COMPARISON:  Chest x-ray of 02/11/2017 FINDINGS: On this portable erect chest x-ray, the lungs are not well aerated with mild basilar volume loss. No pneumonia or pleural effusion is seen. No present evidence of congestive heart failure is noted. Moderate cardiomegaly is stable. A lower anterior cervical spine fusion plate is present. Evidence of prior left shoulder surgery is noted with resection of the distal clavicle. IMPRESSION: 1. Poor inspiration.  No definite active process. 2. Stable moderate cardiomegaly. Electronically Signed   By: Ivar Drape M.D.   On: 03/26/2018 16:40   Assessment and Plan:   1.Acute on chronic diastolic hearty failure: -Per last echocardiogram 02/12/18 with LVEF of 55% to 60% with NWMA and G2DD. There was mild LVH, moderate diastolic dysfunction, trace AI; mild LAE, trace TR, mild to moderate pulmonary hypertension with a peak PA pressure of 42mmHG -Patient has had multiple hospital visits for acute heart failure exacerbation, last appears to be 02/2018 in which she was  followed by nephrologist 03/2018.  Her hospitalization was complicated by AKI in the setting of CHF  exacerbation and possible over-diuresis. She was previously on Lasix 40 mg twice daily (was on 20mg  prior to 01/2018) however, between her hospital admissions, office visits and an SNF stay her baseline dose is unknown.  -At her last nephrologist visit 03/18/2017, she was noted to have gained approximately 9lbs since her hospital discharge and had increased LE swelling and shortness of breath. Given this, her Lasix was increased to 40 mg in the AM and 20 mg in the PM with plans for close follow-up. -Trop negative -CXR without overt HF signs -BNP, 264 03/26/18 -Weight, 237.8lb today, 232lb on12/12/2017 -I&O, net -3.9 L -Creatinine, 1.99 -Will increase IV Lasix to 80mg  BID and monitor response with strict I&O and daily weights -Will change metoprolol to carvedilol 12.5 for greater BP control   2.  CKD stage III: -Creatinine, 1.99 today with a baseline of 1.5-2.5 -Followed by outpatient nephrology -Last seen by nephrology 03/18/2018 with increasing diuretics secondary to significant weight gain and fluid volume overload since last hospitalization -Consider nephrology consultation for diuretic assistance   3. History of atrial flutter: -NSR today with rate control in the 60's  -Continue amiodarone, change metoprolol to carvedilol 12.5 -Not on anticoagulation secondary to meningioma and frequent falls -Monitor LFT's with amiodarone   4.  History of COPD: -Per internal medicine -Bronchodilators as needed  5.  HLD: -Statin on hold secondary to transaminitis  6.  Hypertension: -Elevated, 176/67, 168/58, 135/68 -Continue amlodipine 10, clonidine patch 0.3 -Will change metoprolol to carvedilol  -Consider further medication titration once fluid volume status stable    For questions or updates, please contact [place] Please consult [email or web address] for contact info under Cardiology/STEMI.    SignedKathyrn Drown NP-C [facility] Pager: [phone] 03/28/2018 11:02 AM

## 2018-03-28 NOTE — Progress Notes (Signed)
RT offered to place pt on BIPAP for the night and pt declined. RT will continue to monitor and offer support.

## 2018-03-29 DIAGNOSIS — N183 Chronic kidney disease, stage 3 unspecified: Secondary | ICD-10-CM

## 2018-03-29 LAB — GLUCOSE, CAPILLARY
GLUCOSE-CAPILLARY: 133 mg/dL — AB (ref 70–99)
Glucose-Capillary: 196 mg/dL — ABNORMAL HIGH (ref 70–99)
Glucose-Capillary: 258 mg/dL — ABNORMAL HIGH (ref 70–99)
Glucose-Capillary: 173 mg/dL — ABNORMAL HIGH (ref 70–99)

## 2018-03-29 LAB — BASIC METABOLIC PANEL
Anion gap: 12 (ref 5–15)
BUN: 30 mg/dL — ABNORMAL HIGH (ref 8–23)
CO2: 27 mmol/L (ref 22–32)
Calcium: 8.9 mg/dL (ref 8.9–10.3)
Chloride: 97 mmol/L — ABNORMAL LOW (ref 98–111)
Creatinine, Ser: 1.96 mg/dL — ABNORMAL HIGH (ref 0.44–1.00)
GFR calc non Af Amer: 24 mL/min — ABNORMAL LOW (ref >60)
GFR, EST AFRICAN AMERICAN: 28 mL/min — AB (ref >60)
Glucose, Bld: 184 mg/dL — ABNORMAL HIGH (ref 70–99)
Potassium: 3.9 mmol/L (ref 3.5–5.1)
Sodium: 136 mmol/L (ref 135–145)

## 2018-03-29 MED ORDER — HYDRALAZINE HCL 25 MG PO TABS
25.0000 mg | ORAL_TABLET | Freq: Three times a day (TID) | ORAL | Status: DC
  Administered 2018-03-29 – 2018-03-30 (×3): 25 mg via ORAL
  Filled 2018-03-29 (×4): qty 1

## 2018-03-29 NOTE — Clinical Social Work Note (Signed)
Clinical Social Work Assessment  Patient Details  Name: Morgan [name] MRN: [id number] Date of Birth: January 20, 1941  Date of referral:  03/29/18               Reason for consult:  Discharge Planning                Permission sought to share information with:  Case Manager, Family Supports Permission granted to share information::  Yes, Verbal Permission Granted  Name::     Morgan [name]::  [name] Advertising account planner [facility] in [name]  Relationship::  [name]   Contact Information:     Housing/Transportation Living arrangements for the past 2 months:  [place] of Information:  Patient, Adult Children Patient Interpreter Needed:  None Criminal Activity/Legal Involvement Pertinent to Current Situation/Hospitalization:  No - Comment as needed Significant Relationships:  Adult Children Lives with:  Self Do you feel safe going back to the place where you live?  Yes Need for family participation in patient care:  No (Coment)  Care giving concerns:    Patient and daughter were both in the room. CSW met with them at bedside. Patient was willing to discuss with the social worker. Patient's daughter reported that her mother was receiving home health services and that her mother could benefit from having more resources and support. The patient has been a resident of a skilled nursing facility in the past. Patient reported that she has stayed at [name] [name]. The patient enjoyed staying at that SNF and wouldn't mind going back. Patient's daughter expressed that her mother has some health concerns and she and her brother have been thinking about a long term care facility for their mother to help with her medical issues. They both live close to their mother. Patient lost her husband back in August. Patient reported that she was feeling sleepy and wanted to get back in the bed.   Patient's daughter also had questions about her mothers medication. CSW referred her to  the [name].    Social Worker assessment / plan:    CSW completed the Fl2 note and received verbal permission to send to [name] in [place] and the family would like information sent to [name] [name]. CSW provided the patient a copy of SNF facilities and also placed one in the patient's shadow chart. CSW will need to hard fax the patient's Fl2 note, assessment, and face sheet to the facility. CSW met with the family at bedside and completed the assessment. CSW will follow up with both facilities to see if the patient is able to be discharged.   Employment status:  Retired Forensic scientist:  Medicare PT Recommendations:  [name] / Referral to community resources:  [name]  Patient/Family's Response to care:  Patient and family are concerned but understanding of what is happening with the patient.   Patient/Family's Understanding of and Emotional Response to Diagnosis, Current Treatment, and Prognosis:  Patient's daughter had questions, CSW answered them to the best of her ability. CSW referred the patient's daughter to the nurse and case manager for further medical questions.   Emotional Assessment Appearance:  Appears stated age Attitude/Demeanor/Rapport:  Other(Calm) Affect (typically observed):  Accepting Orientation:  Oriented to Self, Oriented to Place, Oriented to  Time, Oriented to Situation Alcohol / Substance use:  Not Applicable Psych involvement (Current and /or in the community):  No (Comment)  Discharge Needs  Concerns to be addressed:  Care Coordination Readmission within the last 30 days:  No Current discharge risk:  Dependent with Mobility Barriers to Discharge:  Continued Medical Work up, [place], [name] 03/29/2018, 3:14 PM

## 2018-03-29 NOTE — Care Management Important Message (Signed)
Important Message  Patient Details  Name: Morgan [name] MRN: [id number] Date of Birth: 11/23/1940   Medicare Important Message Given:  Yes    Adelaine Roppolo P Lynton Crescenzo 03/29/2018, 3:49 PM

## 2018-03-29 NOTE — Evaluation (Signed)
Occupational Therapy Evaluation Patient Details Name: Morgan [name] MRN: [id number] DOB: November 16, 1940 Today's Date: 03/29/2018    History of Present Illness Pt with acute on chronic heart failure. PMH- chronic diastolic heart failure EF 65-70%, atrial flutter, arthrosclerotic CAD, CKD III/IV, pulmonary HTN, obstructive sleep apnea, DM2, hypothyroidism, nonalcoholic steatohepatitis, seizure disorder, emphysema, major depressive disorder, meningioma s/p right frontal craniotomy, breast cancer s/p left mastectomy and abnormal gait presenting for evaluation of SOB and anasarca.  Of note, pt recent psych admit due to suicidal ideation.    Clinical Impression   Pt with decline in function and safety with ADLs and ADL mobility with decreased strength, balance and endurance. Pt live sat home alone with assist from an aide 5 days/wk, but not for 24/7 and her children assist on the weekends. Pt wold benefit from skilled OT services to address impairments to maximize level of function and safety    Follow Up Recommendations  SNF(HH if 24/7 assist and sup initially at home)    Equipment Recommendations  None recommended by OT    Recommendations for Other Services       Precautions / Restrictions Precautions Precautions: Fall Restrictions Weight Bearing Restrictions: No      Mobility Bed Mobility Overal bed mobility: Needs Assistance Bed Mobility: Supine to Sit     Supine to sit: Min assist     General bed mobility comments: Assist to elevate trunk and assist for LEs off EOB  Transfers Overall transfer level: Needs assistance Equipment used: Rolling walker (2 wheeled) Transfers: Sit to/from Stand Sit to Stand: Min assist;Min guard              Balance Overall balance assessment: Needs assistance Sitting-balance support: No upper extremity supported;Feet supported Sitting balance-Leahy Scale: Good     Standing balance support: Single extremity supported;Bilateral upper  extremity supported;During functional activity                               ADL either performed or assessed with clinical judgement   ADL Overall ADL's : Needs assistance/impaired Eating/Feeding: Independent;Sitting   Grooming: Wash/dry hands;Wash/dry face;Min guard;Standing   Upper Body Bathing: Set up;Supervision/ safety;Sitting   Lower Body Bathing: Moderate assistance   Upper Body Dressing : Set up;Supervision/safety;Sitting   Lower Body Dressing: Moderate assistance   Toilet Transfer: Minimal assistance;Min guard;RW;BSC;Ambulation   Toileting- Clothing Manipulation and Hygiene: Moderate assistance       Functional mobility during ADLs: Minimal assistance;Min guard       Vision Baseline Vision/History: Wears glasses Patient Visual Report: No change from baseline       Perception     Praxis      Pertinent Vitals/Pain Pain Assessment: No/denies pain     Hand Dominance Right   Extremity/Trunk Assessment Upper Extremity Assessment Upper Extremity Assessment: Generalized weakness   Lower Extremity Assessment Lower Extremity Assessment: Defer to PT evaluation       Communication Communication Communication: No difficulties   Cognition Arousal/Alertness: Awake/alert Behavior During Therapy: WFL for tasks assessed/performed Overall Cognitive Status: Within Functional Limits for tasks assessed                                     General Comments       Exercises     Shoulder Instructions      Home Living Family/patient expects to be discharged to::  Skilled nursing facility Living Arrangements: Alone                               Additional Comments: Family and aid in and out but not 24/7      Prior Functioning/Environment Level of Independence: Needs assistance  Gait / Transfers Assistance Needed: Uses a rollator ADL's / Homemaking Assistance Needed: Requires A in and out of shower and some dressing    Comments: sleeps in lift chair per pt report        OT Problem List: Decreased strength;Decreased activity tolerance;Decreased knowledge of use of DME or AE;Obesity      OT Treatment/Interventions: Self-care/ADL training;Therapeutic exercise;DME and/or AE instruction;Therapeutic activities;Patient/family education    OT Goals(Current goals can be found in the care plan section) Acute Rehab OT Goals Patient Stated Goal: return home OT Goal Formulation: With patient Time For Goal Achievement: 04/12/18 Potential to Achieve Goals: Good ADL Goals Pt Will Perform Grooming: with set-up;with supervision;standing Pt Will Perform Upper Body Bathing: with set-up;with modified independence;sitting Pt Will Perform Lower Body Bathing: with min assist;sitting/lateral leans;sit to/from stand Pt Will Perform Upper Body Dressing: with set-up;with modified independence;sitting Pt Will Perform Lower Body Dressing: with min assist;sitting/lateral leans;sit to/from stand Pt Will Transfer to Toilet: with min guard assist;with supervision;ambulating Pt Will Perform Toileting - Clothing Manipulation and hygiene: with min assist;sitting/lateral leans;sit to/from stand  OT Frequency: Min 2X/week   Barriers to D/C: Decreased caregiver support  has aide 5 days/wk, but not for 24/7. Children assist on weekends        Co-evaluation              AM-PAC OT "6 Clicks" Daily Activity     Outcome Measure Help from another person eating meals?: None Help from another person taking care of personal grooming?: A Little Help from another person toileting, which includes using toliet, bedpan, or urinal?: A Lot Help from another person bathing (including washing, rinsing, drying)?: A Lot Help from another person to put on and taking off regular upper body clothing?: A Little Help from another person to put on and taking off regular lower body clothing?: A Lot 6 Click Score: 16   End of Session Equipment  Utilized During Treatment: Gait belt;Rolling walker;Other (comment)(BSC)  Activity Tolerance: Patient tolerated treatment well Patient left: in chair;with call bell/phone within reach  OT Visit Diagnosis: Unsteadiness on feet (R26.81);Other abnormalities of gait and mobility (R26.89);Muscle weakness (generalized) (M62.81)                Time: 4268-3419 OT Time Calculation (min): 24 min Charges:  OT General Charges $OT Visit: 1 Visit OT Evaluation $OT Eval Moderate Complexity: 1 Mod OT Treatments $Therapeutic Activity: 8-22 mins    Britt Bottom 03/29/2018, 12:42 PM

## 2018-03-29 NOTE — Progress Notes (Signed)
PROGRESS NOTE    Morgan [name]  QTM:[id number] DOB: 1941-04-04 DOA: 03/26/2018 PCP: [name]., MD   Brief Narrative:  Morgan [name] is a 77 y.o. female with medical history significant of abnormal LFTs, acquired autoimmune hypothyroidism, basal cell carcinoma of the chest, breast cancer, combined hyperlipidemia, COPD, depression, type 2 diabetes, diabetic peripheral neuropathy, goiter, gout, GERD, history of meningioma and craniotomy, hypertension, history of hypoglycemia associated with diabetes, obesity, renal insufficiency, history of seizures, right shoulder fracture, obstructive sleep apnea, vertigo who was brought to the emergency department due to Progressively worse shortness of breath for the past 2 days.  He was admitted for management of acute on chronic diastolic heart failure.   Assessment & Plan:   Principal Problem:   Acute on chronic diastolic congestive heart failure (HCC) Active Problems:   Mixed hyperlipidemia   Abnormal liver function tests   Type 2 diabetes mellitus with polyneuropathy (HCC)   COPD (chronic obstructive pulmonary disease) (HCC)   Hypothyroidism, acquired, autoimmune   Essential hypertension   Gastroesophageal reflux disease without esophagitis   Atrial flutter (HCC)   Depression   CHF (congestive heart failure) (HCC)   CKD (chronic kidney disease), stage III (HCC)  Acute on chronic diastolic heart failure Admitted for diuresis with IV Lasix twice daily. Cardiology increased the dose to 80 mg BID.  Continue with oxygen as needed to keep sats greater than 90%.   BiPAP as needed.  Continue to monitor daily weights with strict intake and output.  Watch renal para meters and potassium while on IV Lasix.     Recent echocardiogram last month Wall thickness was   increased in a pattern of mild LVH. Systolic function was normal.   The estimated ejection fraction was in the range of 55% to 60%.   Wall motion was normal; there were no regional  wall motion   abnormalities. Features are consistent with a pseudonormal left   ventricular filling pattern, with concomitant abnormal relaxation   and increased filling pressure (grade 2 diastolic dysfunction). - Aortic valve: There was trivial regurgitation.   Cardiology consulted and recommendations given.  Appropriate diuresis overnight.  History of atrial flutter In sinus rhythm.  Continue with amiodarone and metoprolol.  Patient is not on anticoagulation given history of meningioma and frequent falls.  History of COPD Bronchodilators as needed.    Mildly elevated liver enzymes Unclear etiology versus amiodarone toxicity. Recheck liver panel tomorrow.   Hyperlipidemia Statin on hold given transaminitis.    Hypertension  better controlled today Currently on Coreg and hydralazine added by cardiology. .  Constipation Resume Senokot and Amitiza.   Hypothyroidism Acquired and autoimmune. Continue with Synthroid 200 MCG daily.   stage III CKD Creatinine stable at 1.9.    Depression Continue with Cymbalta Abilify and Lamictal.   History of meningioma and seizures. Status post resection of large frontal meningioma in January 2016.  On lamotrigine 800 mg twice daily..  DVT prophylaxis: On heparin subcu Code Status: DNR Family Communication discussed with daughter at bedside disposition Plan: SNF when clinically stable.   Consultants:   Cardiology will be consulted in the morning.   Procedures: None  Antimicrobials: None  Subjective: Wean her off the oxygen.  She denies any chest pain at this time.  Objective: Vitals:   03/29/18 1233 03/29/18 1300 03/29/18 1341 03/29/18 1629  BP: 139/60  (!) 134/53 (!) 111/39  Pulse:  65 64 61  Resp:  19 (!) 28 16  Temp:   98  F (36.7 C) 98.1 F (36.7 C)  TempSrc:   Oral Oral  SpO2:  96% 96% 97%  Weight:      Height:        Intake/Output Summary (Last 24 hours) at 03/29/2018 1722 Last data filed at  03/29/2018 0800 Gross per 24 hour  Intake 480 ml  Output 1850 ml  Net -1370 ml   Filed Weights   03/27/18 0500 03/28/18 0500 03/29/18 0652  Weight: 108.2 kg 108.1 kg 105.9 kg    Examination:  General exam: Alert and sitting in the chair.  Comfortable, not in any kind of distress Respiratory system: Tachypnea improved, air entry fair bilateral  Cardiovascular system: S1 & S2 heard, RRR. No JVD, Gastrointestinal system: Abdomen is soft nontender, nondistended, bowel sounds are good Central nervous system: Alert and oriented to person only Extremities: Trace pedal edema Skin: No rashes, lesions or ulcers Psychiatry: Mood & affect appropriate.     Data Reviewed: I have personally reviewed following labs and imaging studies  CBC: Recent Labs  Lab 03/26/18 1615  WBC 6.8  NEUTROABS 4.6  HGB 8.9*  HCT 28.7*  MCV 94.1  PLT 149   Basic Metabolic Panel: Recent Labs  Lab 03/26/18 1615 03/27/18 0238 03/28/18 0250 03/29/18 0414  NA 136 139 137 136  K 4.7 4.5 4.3 3.9  CL 103 102 102 97*  CO2 23 23 24 27   GLUCOSE 147* 115* 151* 184*  BUN 30* 27* 30* 30*  CREATININE 2.06* 1.99* 1.99* 1.96*  CALCIUM 8.7* 8.7* 8.8* 8.9   GFR: Estimated Creatinine Clearance: 28.5 mL/min (A) (by C-G formula based on SCr of 1.96 mg/dL (H)). Liver Function Tests: Recent Labs  Lab 03/26/18 1615  AST 68*  ALT 48*  ALKPHOS 238*  BILITOT 0.9  PROT 6.6  ALBUMIN 3.0*   No results for input(s): LIPASE, AMYLASE in the last 168 hours. No results for input(s): AMMONIA in the last 168 hours. Coagulation Profile: No results for input(s): INR, PROTIME in the last 168 hours. Cardiac Enzymes: Recent Labs  Lab 03/26/18 2237 03/27/18 0238 03/27/18 1037  TROPONINI <0.03 <0.03 <0.03   BNP (last 3 results) No results for input(s): PROBNP in the last 8760 hours. HbA1C: No results for input(s): HGBA1C in the last 72 hours. CBG: Recent Labs  Lab 03/28/18 1609 03/28/18 2118 03/29/18 0647  03/29/18 1143 03/29/18 1630  GLUCAP 179* 174* 196* 258* 133*   Lipid Profile: No results for input(s): CHOL, HDL, LDLCALC, TRIG, CHOLHDL, LDLDIRECT in the last 72 hours. Thyroid Function Tests: No results for input(s): TSH, T4TOTAL, FREET4, T3FREE, THYROIDAB in the last 72 hours. Anemia Panel: No results for input(s): VITAMINB12, FOLATE, FERRITIN, TIBC, IRON, RETICCTPCT in the last 72 hours. Sepsis Labs: No results for input(s): PROCALCITON, LATICACIDVEN in the last 168 hours.  No results found for this or any previous visit (from the past 240 hour(s)).       Radiology Studies: No results found.      Scheduled Meds: . amiodarone  100 mg Oral Daily  . amLODipine  10 mg Oral Daily  . ARIPiprazole  5 mg Oral QPM  . aspirin EC  81 mg Oral Daily  . carvedilol  12.5 mg Oral BID WC  . chlorhexidine  15 mL Mouth Rinse BID  . cloNIDine  0.3 mg Transdermal Q Fri  . docusate sodium  100 mg Oral BID  . DULoxetine  60 mg Oral BID  . furosemide  80 mg Intravenous BID  . heparin  5,000 Units Subcutaneous Q8H  . hydrALAZINE  25 mg Oral TID  . insulin aspart  0-15 Units Subcutaneous TID WC  . lamoTRIgine  100 mg Oral BID  . levothyroxine  200 mcg Oral QAC breakfast  . liraglutide  1.8 mg Subcutaneous Daily  . lubiprostone  8 mcg Oral BID WC  . mouth rinse  15 mL Mouth Rinse q12n4p  . mometasone-formoterol  2 puff Inhalation BID  . nystatin   Topical TID  . pantoprazole  40 mg Oral BID  . senna  2 tablet Oral QHS  . vitamin B-12  1,000 mcg Oral Daily   Continuous Infusions:   LOS: 2 days    Time spent: 28 minutes    Hosie Poisson, MD [facility] Pager [phone]  If 7PM-7AM, please contact night-coverage [email or web address] Password [email or web address] 03/29/2018, 5:22 PM

## 2018-03-29 NOTE — Progress Notes (Signed)
Progress Note  Patient Name: [facility] [name] Date of Encounter: 03/29/2018  Primary Cardiologist: Dr. Phill Mutter, MD   Subjective   Pt feeling well today. States that she feels better than yesterday. No events overnight. Denies chest pain, SOB or palpitations.   Inpatient Medications    Scheduled Meds: . amiodarone  100 mg Oral Daily  . amLODipine  10 mg Oral Daily  . ARIPiprazole  5 mg Oral QPM  . aspirin EC  81 mg Oral Daily  . carvedilol  12.5 mg Oral BID WC  . chlorhexidine  15 mL Mouth Rinse BID  . cloNIDine  0.3 mg Transdermal Q Fri  . docusate sodium  100 mg Oral BID  . DULoxetine  60 mg Oral BID  . furosemide  80 mg Intravenous BID  . heparin  5,000 Units Subcutaneous Q8H  . insulin aspart  0-15 Units Subcutaneous TID WC  . lamoTRIgine  100 mg Oral BID  . levothyroxine  200 mcg Oral QAC breakfast  . liraglutide  1.8 mg Subcutaneous Daily  . lubiprostone  8 mcg Oral BID WC  . mouth rinse  15 mL Mouth Rinse q12n4p  . mometasone-formoterol  2 puff Inhalation BID  . nystatin   Topical TID  . pantoprazole  40 mg Oral BID  . senna  2 tablet Oral QHS  . vitamin B-12  1,000 mcg Oral Daily   Continuous Infusions:  PRN Meds: acetaminophen **OR** acetaminophen, ALPRAZolam, benzonatate, colchicine, guaiFENesin-dextromethorphan, ipratropium-albuterol, nitroGLYCERIN, ondansetron **OR** ondansetron (ZOFRAN) IV   Vital Signs    Vitals:   03/28/18 1715 03/28/18 1841 03/28/18 2202 03/29/18 0008  BP: (!) 186/65 (!) 163/73 (!) 171/66 (!) 182/69  Pulse:  66 67 69  Resp: (!) 24 (!) 23 20 (!) 23  Temp:   98.3 F (36.8 C) 98.3 F (36.8 C)  TempSrc:   Oral Oral  SpO2:  100% 98% 96%  Weight:      Height:        Intake/Output Summary (Last 24 hours) at 03/29/2018 0648 Last data filed at 03/29/2018 0009 Gross per 24 hour  Intake 380 ml  Output 2150 ml  Net -1770 ml   Filed Weights   03/26/18 1945 03/27/18 0500 03/28/18 0500  Weight: 110.2 kg 108.2 kg 108.1  kg    Physical Exam   General: Obese, NAD Skin: Warm, dry, intact  Head: Normocephalic, atraumatic, clear, moist mucus membranes. Neck: Negative for carotid bruits. No JVD Lungs:Clear to ausculation bilaterally. No wheezes, rales, or rhonchi. Breathing is unlabored. Cardiovascular: RRR with S1 S2. No murmurs, rubs, gallops, or LV heave appreciated. Abdomen: Soft, non-tender, mild distention with normoactive bowel sounds MSK: Strength and tone appear normal for age. 5/5 in all extremities Extremities: BUE 1+ edema. No clubbing or cyanosis. DP/PT pulses 2+ bilaterally Neuro: Alert and oriented. No focal deficits. No facial asymmetry. MAE spontaneously. Psych: Responds to questions appropriately with normal affect.    Labs    Chemistry Recent Labs  Lab 03/26/18 1615 03/27/18 0238 03/28/18 0250 03/29/18 0414  NA 136 139 137 136  K 4.7 4.5 4.3 3.9  CL 103 102 102 97*  CO2 23 23 24 27   GLUCOSE 147* 115* 151* 184*  BUN 30* 27* 30* 30*  CREATININE 2.06* 1.99* 1.99* 1.96*  CALCIUM 8.7* 8.7* 8.8* 8.9  PROT 6.6  --   --   --   ALBUMIN 3.0*  --   --   --   AST 68*  --   --   --  ALT 48*  --   --   --   ALKPHOS 238*  --   --   --   BILITOT 0.9  --   --   --   GFRNONAA 23* 24* 24* 24*  GFRAA 26* 27* 27* 28*  ANIONGAP 10 14 11 12     Hematology Recent Labs  Lab 03/26/18 1615  WBC 6.8  RBC 3.05*  HGB 8.9*  HCT 28.7*  MCV 94.1  MCH 29.2  MCHC 31.0  RDW 14.4  PLT 203   Cardiac Enzymes Recent Labs  Lab 03/26/18 2237 03/27/18 0238 03/27/18 1037  TROPONINI <0.03 <0.03 <0.03    Recent Labs  Lab 03/26/18 1631  TROPIPOC 0.01    BNP Recent Labs  Lab 03/26/18 1615  BNP 264.5*     DDimer No results for input(s): DDIMER in the last 168 hours.   Radiology    No results found.  Telemetry    03/29/18 NSR - Personally Reviewed  ECG    No new tracing as of 03/29/18- Personally Reviewed  Cardiac Studies   ECHO: 02/12/2018: Study Conclusions  - Left  ventricle: The cavity size was normal. Wall thickness was increased in a pattern of mild LVH. Systolic function was normal. The estimated ejection fraction was in the range of 55% to 60%. Wall motion was normal; there were no regional wall motion abnormalities. Features are consistent with a pseudonormal left ventricular filling pattern, with concomitant abnormal relaxation and increased filling pressure (grade 2 diastolic dysfunction). - Aortic valve: There was trivial regurgitation. - Left atrium: The atrium was mildly dilated. - Pulmonary arteries: Systolic pressure was mildly to moderately increased. PA peak pressure: 47 mm Hg (S).  Impressions:  - Normal LV systolic function; mild LVH; moderate diastolic dysfunction; trace AI; mild LAE; trace TR; mild to moderate pulmonary hypertension  Patient Profile     77 y.o. female with a hx of acute on chronic diastolic HF with normal LV function and G2 DD, atrial flutter, DM 2, CKD stage III with partial left nephrectomy about 9 years ago, HTN, abnormal LFTs, autoimmune thyroiditis, COPD, GERD, history of meningioma and craniotomy who is being seen today for the evaluation of acute on chronic diastolic CHF exacerbation at the request of Dr. Karleen Hampshire.  Assessment & Plan    1.Acute on chronic diastolic hearty failure: -Weight, 233lb today, down from 238lb on hospital presentation  -I&O, net negative 4.7L since admission, -1.7L yesterday  -Creatinine improving>>1.99>>1.96 -BMET in AM  -Continue IV Lasix to 80mg  BID and monitor renal response -Strict I&O and daily weights  2.  CKD stage III: -Creatinine, 1.96 today with a baseline of 1.5-2.5 -Followed by outpatient nephrology -Last seen by nephrology 03/18/2018 with escalating diuretic therapy  -Consider nephrology consultation for diuretic assistance   3. History of atrial flutter: -NSR today with rate control in the 60's  -Continue amiodarone, carvedilol 12.5 -Not  on anticoagulation secondary to meningioma and frequent falls -Monitor LFT's with amiodarone   4.  History of COPD: -Per internal medicine -Bronchodilators as needed -Resp status stable at this time   5.  HLD: -Statin on hold secondary to transaminitis  6.  Hypertension: -Elevated, 172/65>182/69>171/66>163/73 -Not much improvement in BP with the transition to carvedilol from metoprolol  -Will add hydralazine 25 TID and monitor further    Signed, Kathyrn Drown NP-C [place] Pager: [phone] 03/29/2018, 6:48 AM     For questions or updates, please contact   Please consult [email or web address] for contact info under Cardiology/STEMI.

## 2018-03-29 NOTE — NC FL2 (Signed)
[place] LEVEL OF CARE SCREENING TOOL     IDENTIFICATION  Patient Name: Morgan [name] Birthdate: 08/01/1940 Sex: female Admission Date (Current Location): 03/26/2018  [facility] and [place] Number:  Herbalist and Address:  [facility], [place] [address] [place], [place] [address]      Provider Number: [id number]  Attending Physician Name and Address:  Hosie Poisson, MD  Relative Name and Phone Number:  Adele Barthel, Daughter, [phone] and Rosanne Ashing, [phone]    Current Level of Care: Hospital Recommended Level of Care: [place] Prior Approval Number:    Date Approved/Denied: 05/28/14 PASRR Number: [id number]  Discharge Plan: SNF    Current Diagnoses: Patient Active Problem List   Diagnosis Date Noted  . CKD (chronic kidney disease), stage III ([place])   . CHF (congestive heart failure) ([place]) 03/27/2018  . Acute on chronic diastolic congestive heart failure ([place]) 03/26/2018  . Depression   . Acute kidney injury ([place]) 02/12/2018  . Chronic diastolic heart failure ([place]) 02/11/2018  . Abnormality of gait 08/23/2016  . Transaminitis 05/22/2016  . CKD (chronic kidney disease) stage 4, GFR 15-29 ml/min (HCC) 05/22/2016  . Parkinsonism ([place]) 02/24/2016  . Seizure disorder ([place]) 04/27/2015  . Atrial flutter ([place]) 03/15/2015  . Mitral regurgitation 03/15/2015  . Cognitive impairment 07/23/2014  . Peripheral neuropathy with Parkisonian features   . Essential hypertension   . Gastroesophageal reflux disease without esophagitis   . History of Meningioma    . Primary gout   . Left hemiparesis ([place])   . 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 Acquired autoimmune hypothyroidism   . Abnormal liver function tests   . Sleep apnea, obstructive   . Major depressive disorder, single episode   . Type 2 diabetes mellitus with polyneuropathy ([place])   . COPD (chronic obstructive pulmonary disease)  ([place])   . Goiter   . Mixed hyperlipidemia 08/01/2010  . Obesity 08/01/2010    Orientation RESPIRATION BLADDER Height & Weight     Self, Time, Situation, Place  Normal Incontinent(External Catheter) Weight: 233 lb 7.5 oz (105.9 kg) Height:  5\' 4"  (162.6 cm)  BEHAVIORAL SYMPTOMS/MOOD NEUROLOGICAL BOWEL NUTRITION STATUS      Continent Diet(Heart healthy, carb modified)  AMBULATORY STATUS COMMUNICATION OF NEEDS Skin   Limited Assist Verbally Normal, Other (Comment)(moisture associated skin damage on groin. Has a rash. Is clean with powder. )                       Personal Care Assistance Level of Assistance  Bathing, Feeding, Dressing, Total care Bathing Assistance: Limited assistance Feeding assistance: Independent Dressing Assistance: Limited assistance Total Care Assistance: Limited assistance   Functional Limitations Info  Sight, Hearing, Speech Sight Info: Adequate Hearing Info: Adequate Speech Info: Adequate    SPECIAL CARE FACTORS FREQUENCY  PT (By licensed PT), OT (By licensed OT)     PT Frequency: 5x/wk OT Frequency: 5x/wk            Contractures Contractures Info: Not present    Additional Factors Info  Code Status, Allergies Code Status Info: DNR Allergies Info: Latex, Wellbutrin Bupropion, Keppra Levetiracetam, Statins, [place] 10 Mcg Pen Exenatide, Metformin, Penicillins, Sulfa Antibiotics           Current Medications (03/29/2018):  This is the current hospital active medication list Current Facility-Administered Medications  Medication Dose Route Frequency Provider Last Rate Last Dose  . acetaminophen (TYLENOL) tablet 650 mg  650 mg Oral Q6H PRN Reubin Milan, MD   650 mg at 03/28/18 1712   Or  . acetaminophen (TYLENOL) suppository 650 mg  650 mg Rectal Q6H PRN Reubin Milan, MD      . ALPRAZolam Duanne Moron) tablet 0.5 mg  0.5 mg Oral Q8H PRN Reubin Milan, MD      . amiodarone (PACERONE) tablet 100 mg  100 mg Oral Daily Reubin Milan, MD   100 mg at 03/29/18 0855  . amLODipine (NORVASC) tablet 10 mg  10 mg Oral Daily Reubin Milan, MD   10 mg at 03/29/18 0902  . ARIPiprazole (ABILIFY) tablet 5 mg  5 mg Oral QPM Reubin Milan, MD   5 mg at 03/28/18 1723  . aspirin EC tablet 81 mg  81 mg Oral Daily Reubin Milan, MD   81 mg at 03/29/18 8588  . benzonatate (TESSALON) capsule 100 mg  100 mg Oral TID PRN Hosie Poisson, MD      . carvedilol (COREG) tablet 12.5 mg  12.5 mg Oral BID WC Kathyrn Drown D, NP   12.5 mg at 03/29/18 0854  . chlorhexidine (PERIDEX) 0.12 % solution 15 mL  15 mL Mouth Rinse BID Reubin Milan, MD   15 mL at 03/29/18 0858  . cloNIDine (CATAPRES - Dosed in mg/24 hr) patch 0.3 mg  0.3 mg Transdermal Q Lula Olszewski, MD   0.3 mg at 03/29/18 1233  . colchicine tablet 0.6 mg  0.6 mg Oral Daily PRN Reubin Milan, MD      . docusate sodium (COLACE) capsule 100 mg  100 mg Oral BID Hosie Poisson, MD   100 mg at 03/29/18 0858  . DULoxetine (CYMBALTA) DR capsule 60 mg  60 mg Oral BID Reubin Milan, MD   60 mg at 03/29/18 0855  . furosemide (LASIX) injection 80 mg  80 mg Intravenous BID Kathyrn Drown D, NP   80 mg at 03/29/18 0849  . guaiFENesin-dextromethorphan (ROBITUSSIN DM) 100-10 MG/5ML syrup 5 mL  5 mL Oral Q4H PRN Hosie Poisson, MD      . heparin injection 5,000 Units  5,000 Units Subcutaneous Q8H Reubin Milan, MD   5,000 Units at 03/29/18 0654  . hydrALAZINE (APRESOLINE) tablet 25 mg  25 mg Oral TID Kathyrn Drown D, NP   25 mg at 03/29/18 0855  . insulin aspart (novoLOG) injection 0-15 Units  0-15 Units Subcutaneous TID WC Reubin Milan, MD   8 Units at 03/29/18 1231  . ipratropium-albuterol (DUONEB) 0.5-2.5 (3) MG/3ML nebulizer solution 3 mL  3 mL Nebulization Q6H PRN Reubin Milan, MD      . lamoTRIgine (LAMICTAL) tablet 100 mg  100 mg Oral BID Reubin Milan, MD   100 mg at 03/29/18 5027  . levothyroxine (SYNTHROID, LEVOTHROID)  tablet 200 mcg  200 mcg Oral QAC breakfast Reubin Milan, MD   200 mcg at 03/29/18 0654  . Liraglutide (Vicotoza) 18mg /3 ml pen  1.8 mg Subcutaneous Daily Hosie Poisson, MD   1.8 mg at 03/29/18 0849  . lubiprostone (AMITIZA) capsule 8 mcg  8 mcg Oral BID WC Reubin Milan, MD   8 mcg at 03/28/18 1712  . MEDLINE mouth rinse  15 mL Mouth Rinse q12n4p Reubin Milan, MD   15 mL at 03/28/18 1227  . mometasone-formoterol (DULERA) 100-5 MCG/ACT inhaler 2 puff  2 puff Inhalation BID Reubin Milan, MD   2 puff  at 03/29/18 0820  . nitroGLYCERIN (NITROSTAT) SL tablet 0.4 mg  0.4 mg Sublingual Q5 min PRN Reubin Milan, MD      . nystatin (MYCOSTATIN/NYSTOP) topical powder   Topical TID Reubin Milan, MD      . ondansetron [facility]) tablet 4 mg  4 mg Oral Q6H PRN Reubin Milan, MD       Or  . ondansetron [facility]) injection 4 mg  4 mg Intravenous Q6H PRN Reubin Milan, MD      . pantoprazole (PROTONIX) EC tablet 40 mg  40 mg Oral BID Reubin Milan, MD   40 mg at 03/29/18 0856  . senna (SENOKOT) tablet 17.2 mg  2 tablet Oral QHS Reubin Milan, MD   17.2 mg at 03/28/18 2150  . vitamin B-12 (CYANOCOBALAMIN) tablet 1,000 mcg  1,000 mcg Oral Daily Reubin Milan, MD   1,000 mcg at 03/29/18 0981     Discharge Medications: Please see discharge summary for a list of discharge medications.  Relevant Imaging Results:  Relevant Lab Results:   Additional Information SSN: [id number]  Philippa Chester Yavier Snider, LCSWA

## 2018-03-30 DIAGNOSIS — J438 Other emphysema: Secondary | ICD-10-CM

## 2018-03-30 LAB — GLUCOSE, CAPILLARY
Glucose-Capillary: 220 mg/dL — ABNORMAL HIGH (ref 70–99)
Glucose-Capillary: 190 mg/dL — ABNORMAL HIGH (ref 70–99)
Glucose-Capillary: 154 mg/dL — ABNORMAL HIGH (ref 70–99)
Glucose-Capillary: 151 mg/dL — ABNORMAL HIGH (ref 70–99)

## 2018-03-30 LAB — BASIC METABOLIC PANEL
Anion gap: 13 (ref 5–15)
BUN: 37 mg/dL — ABNORMAL HIGH (ref 8–23)
CO2: 26 mmol/L (ref 22–32)
Calcium: 8.7 mg/dL — ABNORMAL LOW (ref 8.9–10.3)
Chloride: 96 mmol/L — ABNORMAL LOW (ref 98–111)
Creatinine, Ser: 2.23 mg/dL — ABNORMAL HIGH (ref 0.44–1.00)
GFR calc Af Amer: 24 mL/min — ABNORMAL LOW (ref >60)
GFR calc non Af Amer: 21 mL/min — ABNORMAL LOW (ref >60)
Glucose, Bld: 169 mg/dL — ABNORMAL HIGH (ref 70–99)
Potassium: 3.9 mmol/L (ref 3.5–5.1)
Sodium: 135 mmol/L (ref 135–145)

## 2018-03-30 MED ORDER — FUROSEMIDE 80 MG PO TABS
80.0000 mg | ORAL_TABLET | Freq: Two times a day (BID) | ORAL | Status: DC
  Administered 2018-03-30 – 2018-03-31 (×2): 80 mg via ORAL
  Filled 2018-03-30 (×2): qty 1

## 2018-03-30 MED ORDER — HYDRALAZINE HCL 50 MG PO TABS
50.0000 mg | ORAL_TABLET | Freq: Three times a day (TID) | ORAL | Status: DC
  Administered 2018-03-30 – 2018-04-02 (×9): 50 mg via ORAL
  Filled 2018-03-30 (×9): qty 1

## 2018-03-30 NOTE — Progress Notes (Signed)
Progress Note  Patient Name: Morgan [name] Date of Encounter: 03/30/2018  Primary Cardiologist: Dr. Phill Mutter, MD   Subjective   Breathing better overnight, but overall "feels lousy" - coughing up phlegm, congested, fatigued. Diuresed 1.1L negative. BP poorly controlled. Creatinine rose overnight to 2.23 (from 1.96). Appears below dry weight of 232 on 12/9.  Inpatient Medications    Scheduled Meds: . amiodarone  100 mg Oral Daily  . amLODipine  10 mg Oral Daily  . ARIPiprazole  5 mg Oral QPM  . aspirin EC  81 mg Oral Daily  . carvedilol  12.5 mg Oral BID WC  . chlorhexidine  15 mL Mouth Rinse BID  . cloNIDine  0.3 mg Transdermal Q Fri  . docusate sodium  100 mg Oral BID  . DULoxetine  60 mg Oral BID  . furosemide  80 mg Intravenous BID  . heparin  5,000 Units Subcutaneous Q8H  . hydrALAZINE  25 mg Oral TID  . insulin aspart  0-15 Units Subcutaneous TID WC  . lamoTRIgine  100 mg Oral BID  . levothyroxine  200 mcg Oral QAC breakfast  . liraglutide  1.8 mg Subcutaneous Daily  . lubiprostone  8 mcg Oral BID WC  . mouth rinse  15 mL Mouth Rinse q12n4p  . mometasone-formoterol  2 puff Inhalation BID  . nystatin   Topical TID  . pantoprazole  40 mg Oral BID  . senna  2 tablet Oral QHS  . vitamin B-12  1,000 mcg Oral Daily   Continuous Infusions:  PRN Meds: acetaminophen **OR** acetaminophen, ALPRAZolam, benzonatate, colchicine, guaiFENesin-dextromethorphan, ipratropium-albuterol, nitroGLYCERIN, ondansetron **OR** ondansetron (ZOFRAN) IV   Vital Signs    Vitals:   03/30/18 0339 03/30/18 0738 03/30/18 0750 03/30/18 1126  BP: (!) 154/62  (!) 162/70 (!) 157/68  Pulse: 67  67 72  Resp: 15  18 15   Temp: 98.1 F (36.7 C)  98.3 F (36.8 C) 98.6 F (37 C)  TempSrc: Oral  Oral Oral  SpO2: 96% 95% 96% 95%  Weight: 104.6 kg     Height:        Intake/Output Summary (Last 24 hours) at 03/30/2018 1157 Last data filed at 03/30/2018 0253 Gross per 24 hour  Intake  240 ml  Output 1900 ml  Net -1660 ml   Filed Weights   03/28/18 0500 03/29/18 0652 03/30/18 0339  Weight: 108.1 kg 105.9 kg 104.6 kg    Physical Exam   General appearance: alert and no distress Neck: no carotid bruit, no JVD and thyroid not enlarged, symmetric, no tenderness/mass/nodules Lungs: clear to auscultation bilaterally Heart: regular rate and rhythm, S1, S2 normal, no murmur, click, rub or gallop Abdomen: soft, non-tender; bowel sounds normal; no masses,  no organomegaly Extremities: extremities normal, atraumatic, no cyanosis or edema Pulses: 2+ and symmetric Skin: Skin color, texture, turgor normal. No rashes or lesions Neurologic: Grossly normal Psych: Plesant   Labs    Chemistry Recent Labs  Lab 03/26/18 1615  03/28/18 0250 03/29/18 0414 03/30/18 0248  NA 136   < > 137 136 135  K 4.7   < > 4.3 3.9 3.9  CL 103   < > 102 97* 96*  CO2 23   < > 24 27 26   GLUCOSE 147*   < > 151* 184* 169*  BUN 30*   < > 30* 30* 37*  CREATININE 2.06*   < > 1.99* 1.96* 2.23*  CALCIUM 8.7*   < > 8.8* 8.9 8.7*  PROT 6.6  --   --   --   --   ALBUMIN 3.0*  --   --   --   --   AST 68*  --   --   --   --   ALT 48*  --   --   --   --   ALKPHOS 238*  --   --   --   --   BILITOT 0.9  --   --   --   --   GFRNONAA 23*   < > 24* 24* 21*  GFRAA 26*   < > 27* 28* 24*  ANIONGAP 10   < > 11 12 13    < > = values in this interval not displayed.    Hematology Recent Labs  Lab 03/26/18 1615  WBC 6.8  RBC 3.05*  HGB 8.9*  HCT 28.7*  MCV 94.1  MCH 29.2  MCHC 31.0  RDW 14.4  PLT 203   Cardiac Enzymes Recent Labs  Lab 03/26/18 2237 03/27/18 0238 03/27/18 1037  TROPONINI <0.03 <0.03 <0.03    Recent Labs  Lab 03/26/18 1631  TROPIPOC 0.01    BNP Recent Labs  Lab 03/26/18 1615  BNP 264.5*     DDimer No results for input(s): DDIMER in the last 168 hours.   Radiology    No results found.  Telemetry    Sinus rhythm - Personally Reviewed  ECG    N/A  Cardiac  Studies   ECHO: 02/12/2018: Study Conclusions  - Left ventricle: The cavity size was normal. Wall thickness was increased in a pattern of mild LVH. Systolic function was normal. The estimated ejection fraction was in the range of 55% to 60%. Wall motion was normal; there were no regional wall motion abnormalities. Features are consistent with a pseudonormal left ventricular filling pattern, with concomitant abnormal relaxation and increased filling pressure (grade 2 diastolic dysfunction). - Aortic valve: There was trivial regurgitation. - Left atrium: The atrium was mildly dilated. - Pulmonary arteries: Systolic pressure was mildly to moderately increased. PA peak pressure: 47 mm Hg (S).  Impressions:  - Normal LV systolic function; mild LVH; moderate diastolic dysfunction; trace AI; mild LAE; trace TR; mild to moderate pulmonary hypertension  Patient Profile     77 y.o. female with a hx of acute on chronic diastolic HF with normal LV function and G2 DD, atrial flutter, DM 2, CKD stage III with partial left nephrectomy about 9 years ago, HTN, abnormal LFTs, autoimmune thyroiditis, COPD, GERD, history of meningioma and craniotomy who is being seen today for the evaluation of acute on chronic diastolic CHF exacerbation at the request of Dr. Karleen Hampshire.  Assessment & Plan    1.Acute on chronic diastolic hearty failure: -Weight, 233lb today, down from 238lb on hospital presentation  -I&O, net negative 4.7L since admission, -1.7L yesterday  -Creatinine improving>>1.99>>1.96 -BMET in AM  -Switch to oral lasix 80 mg BID starting tonight.  2.  CKD stage III: -Creatinine, 1.96 today with a baseline of 1.5-2.5 -Followed by outpatient nephrology -Last seen by nephrology 03/18/2018 with escalating diuretic therapy  -Consider nephrology consultation for diuretic assistance  - Creatinine trending up today - ?diuretic endpoint.  3. History of atrial flutter: -NSR today  with rate control in the 60's  -Continue amiodarone, carvedilol 12.5 -Not on anticoagulation secondary to meningioma and frequent falls -Monitor LFT's with amiodarone   4.  History of COPD: -Per internal medicine -Bronchodilators as needed -Resp status stable at this  time   5.  HLD: -Statin on hold secondary to transaminitis - although enzyme elevation is mild and <3x ULN, consider resuming if underlying steatohepatitis is the cause - this is not a contraindication to statins  6.  Hypertension: -Elevated, 172/65>182/69>171/66>163/73 -Not much improvement in BP with the transition to carvedilol from metoprolol  -Increase hydralazine to 50 mg TID  Close to d/c - possible tomorrow from a cardiac standpoint if the other issues can be addressed by hospital medicine.  Pixie Casino, MD, [facility], [facility] Director of the [facility]  [facility] Diplomate of the American Board of Clinical Lipidology Attending Cardiologist  Direct Dial: [phone]  Fax: [phone]  Website:  [email or web address]  03/30/2018, 11:57 AM

## 2018-03-30 NOTE — Progress Notes (Signed)
PROGRESS NOTE    Morgan [name]  NUU:[id number] DOB: 1940/04/24 DOA: 03/26/2018 PCP: [name]., MD   Brief Narrative:  Morgan [name] is a 77 y.o. female with medical history significant of abnormal LFTs, acquired autoimmune hypothyroidism, basal cell carcinoma of the chest, breast cancer, combined hyperlipidemia, COPD, depression, type 2 diabetes, diabetic peripheral neuropathy, goiter, gout, GERD, history of meningioma and craniotomy, hypertension, history of hypoglycemia associated with diabetes, obesity, renal insufficiency, history of seizures, right shoulder fracture, obstructive sleep apnea, vertigo who was brought to the emergency department due to Progressively worse shortness of breath for the past 2 days.  He was admitted for management of acute on chronic diastolic heart failure.   Assessment & Plan:   Principal Problem:   Acute on chronic diastolic congestive heart failure (HCC) Active Problems:   Mixed hyperlipidemia   Abnormal liver function tests   Type 2 diabetes mellitus with polyneuropathy (HCC)   COPD (chronic obstructive pulmonary disease) (HCC)   Hypothyroidism, acquired, autoimmune   Essential hypertension   Gastroesophageal reflux disease without esophagitis   Atrial flutter (HCC)   Depression   CHF (congestive heart failure) (HCC)   CKD (chronic kidney disease), stage III (HCC)  Acute on chronic diastolic heart failure Admitted for diuresis She was started on  IV Lasix 40 mg twice daily. Cardiology increased the dose to 80 mg BID changed to oral today due to bump in creatinine Continue with oxygen as needed to keep sats greater than 90%.   BiPAP as needed.  Continue to monitor daily weights with strict intake and output.      Recent echocardiogram last month Wall thickness was   increased in a pattern of mild LVH. Systolic function was normal.   The estimated ejection fraction was in the range of 55% to 60%.   Wall motion was normal; there were no  regional wall motion   abnormalities. Features are consistent with a pseudonormal left   ventricular filling pattern, with concomitant abnormal relaxation   and increased filling pressure (grade 2 diastolic dysfunction). - Aortic valve: There was trivial regurgitation.   Cardiology consulted and recommendations given.  Net negative balance of 7.7 L since admission.  If her creatinine starts to worsen we will cut her dose back to home dose of Lasix.   History of atrial flutter In sinus rhythm.  Continue with amiodarone and metoprolol.  Patient is not on anticoagulation given history of meningioma and frequent falls.  History of COPD Bronchodilators as needed.    Mildly elevated liver enzymes Unclear etiology versus amiodarone toxicity. Recheck liver panel tomorrow.   Hyperlipidemia Statin on hold given transaminitis.    Hypertension  better controlled today Currently on Coreg and hydralazine added by cardiology. .  Constipation Resume Senokot and Amitiza.   Hypothyroidism Acquired and autoimmune. Continue with Synthroid 200 MCG daily.   stage III CKD Creatinine increased to 2.2 from IV diuresis.    Depression Continue with Cymbalta Abilify and Lamictal.   History of meningioma and seizures. Status post resection of large frontal meningioma in January 2016.  On lamotrigine 800 mg twice daily..  DVT prophylaxis: On heparin subcu Code Status: DNR Family Communication discussed with daughter at bedside disposition Plan: SNF when clinically stable.   Consultants:   Cardiology will be consulted in the morning.   Procedures: None  Antimicrobials: None  Subjective: She was weaned off her oxygen. She has some dry cough. She reports her breathing is better at rest but when she  starts working with therapy she feels exhausted.  Objective: Vitals:   03/30/18 0750 03/30/18 1126 03/30/18 1459 03/30/18 1518  BP: (!) 162/70 (!) 157/68 (!) 153/58 (!) 149/58    Pulse: 67 72  65  Resp: 18 15  (!) 25  Temp: 98.3 F (36.8 C) 98.6 F (37 C)  98 F (36.7 C)  TempSrc: Oral Oral  Oral  SpO2: 96% 95%  97%  Weight:      Height:        Intake/Output Summary (Last 24 hours) at 03/30/2018 1720 Last data filed at 03/30/2018 1521 Gross per 24 hour  Intake 240 ml  Output 2700 ml  Net -2460 ml   Filed Weights   03/28/18 0500 03/29/18 0652 03/30/18 0339  Weight: 108.1 kg 105.9 kg 104.6 kg    Examination:  General exam: Alert and comfortable Respiratory system: Clear to auscultation bilaterally, no wheezing or rhonchi  Cardiovascular system: S1 & S2 heard, RRR. No JVD, Gastrointestinal system: Abdomen is soft, nontender, nondistended, bowel sounds are good Central nervous system: Alert and oriented to person only Extremities: Trace pedal edema Skin: No rashes, lesions or ulcers Psychiatry: Mood & affect appropriate.     Data Reviewed: I have personally reviewed following labs and imaging studies  CBC: Recent Labs  Lab 03/26/18 1615  WBC 6.8  NEUTROABS 4.6  HGB 8.9*  HCT 28.7*  MCV 94.1  PLT 937   Basic Metabolic Panel: Recent Labs  Lab 03/26/18 1615 03/27/18 0238 03/28/18 0250 03/29/18 0414 03/30/18 0248  NA 136 139 137 136 135  K 4.7 4.5 4.3 3.9 3.9  CL 103 102 102 97* 96*  CO2 23 23 24 27 26   GLUCOSE 147* 115* 151* 184* 169*  BUN 30* 27* 30* 30* 37*  CREATININE 2.06* 1.99* 1.99* 1.96* 2.23*  CALCIUM 8.7* 8.7* 8.8* 8.9 8.7*   GFR: Estimated Creatinine Clearance: 24.9 mL/min (A) (by C-G formula based on SCr of 2.23 mg/dL (H)). Liver Function Tests: Recent Labs  Lab 03/26/18 1615  AST 68*  ALT 48*  ALKPHOS 238*  BILITOT 0.9  PROT 6.6  ALBUMIN 3.0*   No results for input(s): LIPASE, AMYLASE in the last 168 hours. No results for input(s): AMMONIA in the last 168 hours. Coagulation Profile: No results for input(s): INR, PROTIME in the last 168 hours. Cardiac Enzymes: Recent Labs  Lab 03/26/18 2237  03/27/18 0238 03/27/18 1037  TROPONINI <0.03 <0.03 <0.03   BNP (last 3 results) No results for input(s): PROBNP in the last 8760 hours. HbA1C: No results for input(s): HGBA1C in the last 72 hours. CBG: Recent Labs  Lab 03/29/18 1630 03/29/18 2042 03/30/18 0605 03/30/18 1125 03/30/18 1631  GLUCAP 133* 173* 220* 190* 154*   Lipid Profile: No results for input(s): CHOL, HDL, LDLCALC, TRIG, CHOLHDL, LDLDIRECT in the last 72 hours. Thyroid Function Tests: No results for input(s): TSH, T4TOTAL, FREET4, T3FREE, THYROIDAB in the last 72 hours. Anemia Panel: No results for input(s): VITAMINB12, FOLATE, FERRITIN, TIBC, IRON, RETICCTPCT in the last 72 hours. Sepsis Labs: No results for input(s): PROCALCITON, LATICACIDVEN in the last 168 hours.  No results found for this or any previous visit (from the past 240 hour(s)).       Radiology Studies: No results found.      Scheduled Meds: . amiodarone  100 mg Oral Daily  . amLODipine  10 mg Oral Daily  . ARIPiprazole  5 mg Oral QPM  . aspirin EC  81 mg Oral Daily  .  carvedilol  12.5 mg Oral BID WC  . chlorhexidine  15 mL Mouth Rinse BID  . cloNIDine  0.3 mg Transdermal Q Fri  . docusate sodium  100 mg Oral BID  . DULoxetine  60 mg Oral BID  . furosemide  80 mg Oral BID  . heparin  5,000 Units Subcutaneous Q8H  . hydrALAZINE  50 mg Oral TID  . insulin aspart  0-15 Units Subcutaneous TID WC  . lamoTRIgine  100 mg Oral BID  . levothyroxine  200 mcg Oral QAC breakfast  . liraglutide  1.8 mg Subcutaneous Daily  . lubiprostone  8 mcg Oral BID WC  . mouth rinse  15 mL Mouth Rinse q12n4p  . mometasone-formoterol  2 puff Inhalation BID  . nystatin   Topical TID  . pantoprazole  40 mg Oral BID  . senna  2 tablet Oral QHS  . vitamin B-12  1,000 mcg Oral Daily   Continuous Infusions:   LOS: 3 days    Time spent: 25 minutes   Hosie Poisson, MD [facility] Pager [phone]  If 7PM-7AM, please contact  night-coverage [email or web address] Password [email or web address] 03/30/2018, 5:20 PM

## 2018-03-30 NOTE — Consult Note (Signed)
Renal Service Consult Note [facility]  Morgan [name] 03/30/2018 Morgan [name] Requesting Physician:  Dr. Karleen Hampshire  Reason for Consult:  Acute renal failure  HPI: The patient is a 77 y.o. year-old w/ h/o breast Ca, COPD, DM2, HTN, seizure d/o, CKD, OSA presented on 03/26/18 with dyspnea.  Had just been dc'd from SNF/ [facility]. Her lasix had recently been lowered to 40 /d from 4m bid.  Pt noted increasing LE edeam and DOE and was unable to perform usual daily activities. Eval showed high BP 180/70, 100 spO2 on bipap, R 22.  Hb 8.9, plt 203, BNP 245, trop ok. 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 CXR showed CM only. Pt was admitted for a/c diast CHF, atrial flutter, abnormal LFT"s , DM2, COPD and HTN. IV lasix was started. Asked to see for renal failure.   Here the creat was 1.99 on admit 12/17 and today is up some to 2.23.   BP's here have all been normal to high.  I/O's here are 1.9L in and 9.6 L out over 5 days Wts are down 110kg >> 104 kg CXR on 12/17 showed no acute disease 02/10/2018 ECHO showed >> LVEF 55%, LVH mild, G2DD, PA pressures 444mHg peak  Patient had no specific c/o's at this time other than she got up to walk today and is still "giving out" while walking, however her O2 levels are not dropping.       EChart:  04/2014 - seizures/ brain tumor, L hemiparesis +aphasia > had surg resection of meningioma w/o complications  1044/0347 acute syst HF, DM2, afib w RVR, HTN  08/2015 - UTI w/ AMS, hx complex partial sz's after meningioma resection and ataxia/ falls  05/2016 - falls and gen weak, severe hypoglycemia, insulin regimen chg'd, dc'd to snf  02/2018 - a/c diast HF, CKD 3b, major depression      Date   Creat  eGFR  2010- 11  0.7- 1.0  2012- 16  0.9- 1.5  2017   1.4- 1.5  2018   1.5- 2.0 23- 28, stage IV  07/2017  1.12 Feb 2018  1.8- 2.2 17- 23, stage IV  Mar 26, 2018  2.06  21- 24, stage IV  Dec 19  1.99  Dec 21  2.23     ROS   denies CP  no joint pain   no HA  no blurry vision  no rash  no diarrhea  no nausea/ vomiting  no dysuria  no difficulty voiding  no change in urine color    Past Medical History  Past Medical History:  Diagnosis Date  . Abnormal liver function tests   . Acquired autoimmune hypothyroidism   . Basal cell carcinoma    Chest  . Cancer (HCC)    Breast  . Combined hyperlipidemia   . COPD with acute exacerbation ([facility] [place]  . Depression   . Diabetes mellitus   . Diabetes type 2, uncontrolled ([facility]  . DM neuropathy with neurologic complication ([facility]  . Fracture    Left wrist  . Goiter   . Gout   . History of gastroesophageal reflux (GERD)   . Hx of craniotomy 08/13/2017  . Hypertension   . Hypoglycemia associated with diabetes ([facility]  . Meningioma ([facility]  . Obesity   . Renal insufficiency   . Seizure ([facility]  . Shoulder fracture, right 07/2015  . Sleep apnea, obstructive   .  Thyroiditis, autoimmune   . Type II diabetes mellitus with peripheral angiopathy ([place])   . Vertigo    Past Surgical History  Past Surgical History:  Procedure Laterality Date  . APPENDECTOMY    . BACK SURGERY    . BASAL CELL CARCINOMA EXCISION     Chest  . CHOLECYSTECTOMY    . CRANIOTOMY Right 05/08/2014   Procedure: CRANIOTOMY FOR MENINGIOMA;  Surgeon: Ashok Pall, MD;  Location: [place] [facility];  Service: Neurosurgery;  Laterality: Right;  Right Craniotomy for meningioma  . ESOPHAGOGASTRODUODENOSCOPY (EGD) WITH PROPOFOL N/A 05/22/2014   Procedure: ESOPHAGOGASTRODUODENOSCOPY (EGD) WITH PROPOFOL;  Surgeon: Wonda Horner, MD;  Location: [facility] ENDOSCOPY;  Service: Endoscopy;  Laterality: N/A;  . LAPAROSCOPIC GASTRIC BANDING    . MASTECTOMY Left    Family History  Family History  Problem Relation Age of Onset  . Melanoma Mother   . Diabetes Father   . CAD Father   . Diabetes Sister   . Diabetes Brother    Social History  reports that she has never smoked. She has never used smokeless tobacco. She  reports that she does not drink alcohol or use drugs. Allergies  Allergies  Allergen Reactions  . Latex Rash  . Wellbutrin [Bupropion] Other (See Comments)    Unable to stand.   . Keppra [Levetiracetam] Other (See Comments)    Dementia.  . Statins   . Byetta 10 Mcg Pen [Exenatide] Rash  . Metformin Rash    RENAL INSUFFICIENCY-takes low dose  . Penicillins Rash  . Sulfa Antibiotics Other (See Comments)    Unknown allergic reaction   Home medications Prior to Admission medications   Medication Sig Start Date End Date Taking? Authorizing Provider  acetaminophen (TYLENOL) 325 MG tablet Take 650 mg by mouth every 6 (six) hours as needed for mild pain.    Yes [provider]  ALPRAZolam (XANAX) 0.5 MG tablet Take 0.5 mg by mouth every 8 (eight) hours as needed for anxiety.   Yes [provider]  amiodarone (PACERONE) 200 MG tablet Take 0.5 tablets (100 mg total) by mouth daily. TAKE 1/2 TABLET (200 MG TOTAL) BY MOUTH DAILY. 02/19/18  Yes Seawell, Jaimie A, DO  amLODipine (NORVASC) 10 MG tablet Take 1 tablet (10 mg total) by mouth daily. 02/20/18  Yes Seawell, Jaimie A, DO  ARIPiprazole (ABILIFY) 5 MG tablet Take 5 mg by mouth every evening.   Yes [provider]  aspirin EC 81 MG tablet Take 1 tablet (81 mg total) by mouth daily. 01/28/18  Yes [name], MD  budesonide-formoterol (SYMBICORT) 80-4.5 MCG/ACT inhaler Inhale 2 puffs into the lungs 2 (two) times daily. 07/05/16  Yes [provider]  Calcium Carb-Cholecalciferol (CALCIUM 600+D) 600-800 MG-UNIT TABS Take 1 tablet by mouth daily.   Yes [provider]  cloNIDine (CATAPRES - DOSED IN MG/24 HR) 0.3 mg/24hr patch Place 0.3 mg onto the skin every Friday.    Yes [provider]  colchicine 0.6 MG tablet Take 0.6 mg by mouth daily as needed (for gout).    Yes [provider]  DULoxetine (CYMBALTA) 60 MG capsule Take 60 mg by mouth 2 (two) times daily.    Yes [provider]  furosemide (LASIX) 40 MG tablet Take 40 mg by mouth 2 (two) times daily.    Yes [provider]  HUMULIN R 500 UNIT/ML injection Inject 5-10 Units into the skin 3 (three) times daily with meals. Sliding scale:   NOTE:  Doses  below are where U-100 syringe is drawn to; actually provides 5 times more insulin since using U-500 150 and below no Insulin  Breakfast  151-200 = 9 units 201-250= 10 units 251-300= 11 units 301-350=12 units 351-400= 13 units 4001-455= 14 units Above 450 = 15 units  Lunch  151-200= 8 units 201-250= 9 units 251-300= 10 units 301-350= 11 units 351-400= 12 units 401-450= 13 units Above 450 = 14 units  Supper 151-200= 4 units 201-250= 5 units 251-300= 6 units 301-350=7 units 351-400= 8 units 401-450= 9 units Above 450 = 10 units  NO INSULIN AFTER SUPPER, EVEN IF IT'S HIGH BECAUSE HER LEVELS WILL DROP DURING THE NIGHT. PLEASE GIVE A SNACK 01/26/17  Yes [provider]  lamoTRIgine (LAMICTAL) 100 MG tablet Take 100 mg by mouth 2 (two) times daily.   Yes [provider]  levothyroxine (SYNTHROID, LEVOTHROID) 200 MCG tablet Take 1 tablet (200 mcg total) by mouth daily before breakfast. Patient taking differently: Take 200 mcg by mouth See admin instructions. Take 2 tablets on Sunday then take 1 tablet on all other days 07/31/17  Yes Crenshaw, Denice Bors, MD  liraglutide (VICTOZA) 18 MG/3ML SOPN Inject 1.8 mg into the skin daily. Must take at with First dose if Inulin Must eat after 05/01/16  Yes [provider]  lubiprostone (AMITIZA) 8 MCG capsule Take 8 mcg by mouth 2 (two) times daily with a meal.   Yes [provider]  metoprolol tartrate (LOPRESSOR) 50 MG tablet Take 25 mg by mouth 2 (two) times daily.  01/22/17  Yes [provider]  Multiple Vitamin (MULTIVITAMIN WITH MINERALS) TABS tablet Take 1 tablet by mouth daily.   Yes [provider]  nitroGLYCERIN (NITROSTAT) 0.4 MG SL tablet  Place 0.4 mg under the tongue every 5 (five) minutes as needed for chest pain.   Yes [provider]  nystatin (MYCOSTATIN/NYSTOP) powder Apply 1 g topically daily as needed (irritation).   Yes [provider]  nystatin ointment (MYCOSTATIN) Apply 1 application topically 2 (two) times daily. 03/11/18  Yes [provider]  pantoprazole (PROTONIX) 40 MG tablet Take 40 mg by mouth 2 (two) times daily.    Yes [provider]  senna (SENOKOT) 8.6 MG TABS tablet Take 2 tablets by mouth at bedtime.    Yes [provider]  vitamin B-12 (CYANOCOBALAMIN) 1000 MCG tablet Take 1,000 mcg by mouth daily.   Yes [provider]  albuterol (PROVENTIL) (2.5 MG/3ML) 0.083% nebulizer solution Take 3 mLs (2.5 mg total) by nebulization every 2 (two) hours as needed for shortness of breath. Patient not taking: Reported on 03/26/2018 05/26/16   Jonetta Osgood, MD   Liver Function Tests Recent Labs  Lab 03/26/18 1615  AST 68*  ALT 48*  ALKPHOS 238*  BILITOT 0.9  PROT 6.6  ALBUMIN 3.0*   No results for input(s): LIPASE, AMYLASE in the last 168 hours. CBC Recent Labs  Lab 03/26/18 1615  WBC 6.8  NEUTROABS 4.6  HGB 8.9*  HCT 28.7*  MCV 94.1  PLT 859   Basic Metabolic Panel Recent Labs  Lab 03/26/18 1615 03/27/18 0238 03/28/18 0250 03/29/18 0414 03/30/18 0248  NA 136 139 137 136 135  K 4.7 4.5 4.3 3.9 3.9  CL 103 102 102 97* 96*  CO2 23 23 24 27 26   GLUCOSE 147* 115* 151* 184* 169*  BUN 30* 27* 30* 30* 37*  CREATININE 2.06* 1.99* 1.99* 1.96* 2.23*  CALCIUM 8.7* 8.7* 8.8* 8.9 8.7*  Iron/TIBC/Ferritin/ %Sat No results found for: IRON, TIBC, FERRITIN, IRONPCTSAT  Vitals:   03/30/18 0750 03/30/18 1126 03/30/18 1459 03/30/18 1518  BP: (!) 162/70 (!) 157/68 (!) 153/58 (!) 149/58  Pulse: 67 72  65  Resp: 18 15  (!) 25  Temp: 98.3 F (36.8 C) 98.6 F (37 C)  98 F (36.7 C)  TempSrc: Oral Oral  Oral  SpO2: 96% 95%  97%  Weight:       Height:       Exam Gen obese, deconditioned WF in  No rash, cyanosis or gangrene Sclera anicteric, throat clear No jvd or bruits Chest clear bilat to bases, no rales or wheezing RRR no MRG Abd soft ntnd no mass or ascites +bs GU defer MS no joint effusions or deformity Ext no LE or UE edema, no wounds or ulcers Neuro is alert, Ox 3 , nf    Home meds:  - amlodipine 10/ clonidine patch 0.3/ furosemide 40 bid/ metoprolol 25 bid  - liraglutide 1.8 sq qd/ humulin R SSI tid4- 15 units  - duloxetine 60 bid/ aripiprazole 5 hs/ alprazolam 0.5 tid prn  - amiodarone 100 qd/ sl NTG prn/ aspirin 81  - pantoprazole 40 bid/ T4 20 ug/ colchicine prn   - lamotrigine 100 bid  - symbicort bid     Impression/ Plan: 1. CKD stage IV - creat is at/ near her baseline, her eGFR is around 20 ml/min.  Creat bump today is due to IV diuresis, she has had 9L UOP and is 7L negative since admit and 6kg down by weights. Exam is neg for any extra volume, her CXR never showed edema in the 1st place.  Would dc IV diuresis and look for another cause for DOE (deconditioning).  Have d/w pt and her son.  Can resume her home dose or increase by 50%.  She should f/u w/ her kidney doctor in [facility] closely.  Will sign off.  2. Vol overload - as above, resolved 3. Seizure d/o 4. HTN - cont meds 5. Afib  6. DM2     Kelly Splinter MD [facility] pager [phone]   03/30/2018, 5:16 PM

## 2018-03-30 NOTE — Plan of Care (Signed)
  Problem: Education: Goal: Knowledge of General Education information will improve Description Including pain rating scale, medication(s)/side effects and non-pharmacologic comfort measures Outcome: Progressing   Problem: Health Behavior/Discharge Planning: Goal: Ability to manage health-related needs will improve Outcome: Progressing   Problem: Clinical Measurements: Goal: Diagnostic test results will improve Outcome: Progressing   Problem: Education: Goal: Knowledge of General Education information will improve Description Including pain rating scale, medication(s)/side effects and non-pharmacologic comfort measures Outcome: Progressing   Problem: Clinical Measurements: Goal: Will remain free from infection Outcome: Not Progressing

## 2018-03-31 LAB — GLUCOSE, CAPILLARY
Glucose-Capillary: 197 mg/dL — ABNORMAL HIGH (ref 70–99)
Glucose-Capillary: 173 mg/dL — ABNORMAL HIGH (ref 70–99)
Glucose-Capillary: 142 mg/dL — ABNORMAL HIGH (ref 70–99)

## 2018-03-31 LAB — BASIC METABOLIC PANEL
ANION GAP: 13 (ref 5–15)
BUN: 39 mg/dL — ABNORMAL HIGH (ref 8–23)
CO2: 27 mmol/L (ref 22–32)
Calcium: 8.8 mg/dL — ABNORMAL LOW (ref 8.9–10.3)
Chloride: 93 mmol/L — ABNORMAL LOW (ref 98–111)
Creatinine, Ser: 2.45 mg/dL — ABNORMAL HIGH (ref 0.44–1.00)
GFR calc Af Amer: 21 mL/min — ABNORMAL LOW (ref >60)
GFR calc non Af Amer: 18 mL/min — ABNORMAL LOW (ref >60)
GLUCOSE: 189 mg/dL — AB (ref 70–99)
POTASSIUM: 3.9 mmol/L (ref 3.5–5.1)
Sodium: 133 mmol/L — ABNORMAL LOW (ref 135–145)

## 2018-03-31 MED ORDER — FUROSEMIDE 40 MG PO TABS: 40.0000 mg | ORAL_TABLET | Freq: Two times a day (BID) | ORAL | Status: DC

## 2018-03-31 MED ORDER — FUROSEMIDE 40 MG PO TABS: 40.0000 mg | ORAL_TABLET | Freq: Every day | ORAL | Status: DC

## 2018-03-31 NOTE — Progress Notes (Signed)
PROGRESS NOTE    Morgan [name]  WYO:[id number] DOB: 11-Nov-1940 DOA: 03/26/2018 PCP: [name]., MD   Brief Narrative:  Morgan [name] is a 77 y.o. female with medical history significant of abnormal LFTs, acquired autoimmune hypothyroidism, basal cell carcinoma of the chest, breast cancer, combined hyperlipidemia, COPD, depression, type 2 diabetes, diabetic peripheral neuropathy, goiter, gout, GERD, history of meningioma and craniotomy, hypertension, history of hypoglycemia associated with diabetes, obesity, renal insufficiency, history of seizures, right shoulder fracture, obstructive sleep apnea, vertigo who was brought to the emergency department due to Progressively worse shortness of breath for the past 2 days.  He was admitted for management of acute on chronic diastolic heart failure.   Assessment & Plan:   Principal Problem:   Acute on chronic diastolic congestive heart failure (HCC) Active Problems:   Mixed hyperlipidemia   Abnormal liver function tests   Type 2 diabetes mellitus with polyneuropathy (HCC)   COPD (chronic obstructive pulmonary disease) (HCC)   Hypothyroidism, acquired, autoimmune   Essential hypertension   Gastroesophageal reflux disease without esophagitis   Atrial flutter (HCC)   Depression   CHF (congestive heart failure) (HCC)   CKD (chronic kidney disease), stage III (HCC)  Acute on chronic diastolic heart failure Admitted for diuresis She was started on  IV Lasix 40 mg twice daily later on was increased to 80 mg twice daily.  Due to bump in creatinine we have stopped the Lasix today.  Recheck creatinine tomorrow and if creatinine stabilized, then will resume lasix at 40 mg BID.  Continue with oxygen as needed to keep sats greater than 90%. Continue to monitor daily weights with strict intake and output.      Recent echocardiogram last month Wall thickness was   increased in a pattern of mild LVH. Systolic function was normal.   The estimated  ejection fraction was in the range of 55% to 60%.   Wall motion was normal; there were no regional wall motion   abnormalities. Features are consistent with a pseudonormal left   ventricular filling pattern, with concomitant abnormal relaxation   and increased filling pressure (grade 2 diastolic dysfunction). - Aortic valve: There was trivial regurgitation.   Cardiology consulted and recommendations given.  Net negative balance of 8.3 L since admission.   History of atrial flutter In sinus rhythm.  Continue with amiodarone and metoprolol.  Patient is not on anticoagulation given history of meningioma and frequent falls.  History of COPD Bronchodilators as needed.  No wheezing heard on exam today    Mildly elevated liver enzymes Unclear etiology versus amiodarone toxicity. Recheck liver panel tomorrow.   Hyperlipidemia Statin on hold given transaminitis.    Hypertension  better controlled today Currently on Coreg and hydralazine 50 mg 3 times daily .  Constipation Resume Senokot and Amitiza.   Hypothyroidism Acquired and autoimmune. Continue with Synthroid 200 MCG daily.   stage III CKD Creatinine increased to 2. 4 from overdiuresis    Depression Continue with Cymbalta Abilify and Lamictal.   History of meningioma and seizures. Status post resection of large frontal meningioma in January 2016.  On lamotrigine 800 mg twice daily..  DVT prophylaxis: On heparin subcu Code Status: DNR Family Communication none at bedside today disposition Plan: SNF when clinically stable.   Consultants:   Cardiology Dr. Duwayne Heck   Procedures: None  Antimicrobials: None  Subjective: No new complaints today. Objective: Vitals:   03/31/18 1147 03/31/18 1238 03/31/18 1300 03/31/18 1637  BP:  (!) 131/53  Marland Kitchen)  147/65  Pulse:  67 66 67  Resp:  (!) 23 (!) 26 (!) 24  Temp:  98.2 F (36.8 C)  98.3 F (36.8 C)  TempSrc:  Oral  Oral  SpO2: 94% 95% 93% 96%  Weight:        Height:        Intake/Output Summary (Last 24 hours) at 03/31/2018 1752 Last data filed at 03/31/2018 1637 Gross per 24 hour  Intake 600 ml  Output 1200 ml  Net -600 ml   Filed Weights   03/29/18 6659 03/30/18 0339 03/31/18 0423  Weight: 105.9 kg 104.6 kg 105.3 kg    Examination:  General exam: Alert and comfortable distress noted Respiratory system: Clear to auscultation bilaterally no wheezing or rhonchi  Cardiovascular system: S1 & S2 heard, RRR. No JVD, Gastrointestinal system: Abdomen is soft, nt nd bs+  Central nervous system: Alert and oriented to person only Extremities: Trace pedal edema Skin: No rashes, lesions or ulcers Psychiatry: Mood & affect appropriate.     Data Reviewed: I have personally reviewed following labs and imaging studies  CBC: Recent Labs  Lab 03/26/18 1615  WBC 6.8  NEUTROABS 4.6  HGB 8.9*  HCT 28.7*  MCV 94.1  PLT 935   Basic Metabolic Panel: Recent Labs  Lab 03/27/18 0238 03/28/18 0250 03/29/18 0414 03/30/18 0248 03/31/18 0429  NA 139 137 136 135 133*  K 4.5 4.3 3.9 3.9 3.9  CL 102 102 97* 96* 93*  CO2 23 24 27 26 27   GLUCOSE 115* 151* 184* 169* 189*  BUN 27* 30* 30* 37* 39*  CREATININE 1.99* 1.99* 1.96* 2.23* 2.45*  CALCIUM 8.7* 8.8* 8.9 8.7* 8.8*   GFR: Estimated Creatinine Clearance: 22.7 mL/min (A) (by C-G formula based on SCr of 2.45 mg/dL (H)). Liver Function Tests: Recent Labs  Lab 03/26/18 1615  AST 68*  ALT 48*  ALKPHOS 238*  BILITOT 0.9  PROT 6.6  ALBUMIN 3.0*   No results for input(s): LIPASE, AMYLASE in the last 168 hours. No results for input(s): AMMONIA in the last 168 hours. Coagulation Profile: No results for input(s): INR, PROTIME in the last 168 hours. Cardiac Enzymes: Recent Labs  Lab 03/26/18 2237 03/27/18 0238 03/27/18 1037  TROPONINI <0.03 <0.03 <0.03   BNP (last 3 results) No results for input(s): PROBNP in the last 8760 hours. HbA1C: No results for input(s): HGBA1C in the  last 72 hours. CBG: Recent Labs  Lab 03/30/18 1125 03/30/18 1631 03/30/18 2055 03/31/18 0615 03/31/18 1708  GLUCAP 190* 154* 151* 197* 173*   Lipid Profile: No results for input(s): CHOL, HDL, LDLCALC, TRIG, CHOLHDL, LDLDIRECT in the last 72 hours. Thyroid Function Tests: No results for input(s): TSH, T4TOTAL, FREET4, T3FREE, THYROIDAB in the last 72 hours. Anemia Panel: No results for input(s): VITAMINB12, FOLATE, FERRITIN, TIBC, IRON, RETICCTPCT in the last 72 hours. Sepsis Labs: No results for input(s): PROCALCITON, LATICACIDVEN in the last 168 hours.  No results found for this or any previous visit (from the past 240 hour(s)).       Radiology Studies: No results found.      Scheduled Meds: . amiodarone  100 mg Oral Daily  . amLODipine  10 mg Oral Daily  . ARIPiprazole  5 mg Oral QPM  . aspirin EC  81 mg Oral Daily  . carvedilol  12.5 mg Oral BID WC  . chlorhexidine  15 mL Mouth Rinse BID  . cloNIDine  0.3 mg Transdermal Q Fri  . docusate sodium  100 mg Oral BID  . DULoxetine  60 mg Oral BID  . [START ON 04/01/2018] furosemide  40 mg Oral BID  . heparin  5,000 Units Subcutaneous Q8H  . hydrALAZINE  50 mg Oral TID  . insulin aspart  0-15 Units Subcutaneous TID WC  . lamoTRIgine  100 mg Oral BID  . levothyroxine  200 mcg Oral QAC breakfast  . liraglutide  1.8 mg Subcutaneous Daily  . lubiprostone  8 mcg Oral BID WC  . mouth rinse  15 mL Mouth Rinse q12n4p  . mometasone-formoterol  2 puff Inhalation BID  . nystatin   Topical TID  . pantoprazole  40 mg Oral BID  . senna  2 tablet Oral QHS  . vitamin B-12  1,000 mcg Oral Daily   Continuous Infusions:   LOS: 4 days    Time spent: 25 minutes   Hosie Poisson, MD [facility] Pager [phone]  If 7PM-7AM, please contact night-coverage [email or web address] Password [facility] 03/31/2018, 5:52 PM

## 2018-03-31 NOTE — Progress Notes (Addendum)
Progress Note  Patient Name: Morgan [name] Date of Encounter: 03/31/2018  Primary Cardiologist: Dr. Phill Mutter, MD   Subjective   Creatinine up further today. Sodium trending down. Switched to oral lasix yesterday.  Inpatient Medications    Scheduled Meds: . amiodarone  100 mg Oral Daily  . amLODipine  10 mg Oral Daily  . ARIPiprazole  5 mg Oral QPM  . aspirin EC  81 mg Oral Daily  . carvedilol  12.5 mg Oral BID WC  . chlorhexidine  15 mL Mouth Rinse BID  . cloNIDine  0.3 mg Transdermal Q Fri  . docusate sodium  100 mg Oral BID  . DULoxetine  60 mg Oral BID  . [START ON 04/01/2018] furosemide  40 mg Oral BID  . heparin  5,000 Units Subcutaneous Q8H  . hydrALAZINE  50 mg Oral TID  . insulin aspart  0-15 Units Subcutaneous TID WC  . lamoTRIgine  100 mg Oral BID  . levothyroxine  200 mcg Oral QAC breakfast  . liraglutide  1.8 mg Subcutaneous Daily  . lubiprostone  8 mcg Oral BID WC  . mouth rinse  15 mL Mouth Rinse q12n4p  . mometasone-formoterol  2 puff Inhalation BID  . nystatin   Topical TID  . pantoprazole  40 mg Oral BID  . senna  2 tablet Oral QHS  . vitamin B-12  1,000 mcg Oral Daily   Continuous Infusions:  PRN Meds: acetaminophen **OR** acetaminophen, ALPRAZolam, benzonatate, colchicine, guaiFENesin-dextromethorphan, ipratropium-albuterol, nitroGLYCERIN, ondansetron **OR** ondansetron (ZOFRAN) IV   Vital Signs    Vitals:   03/31/18 0700 03/31/18 0747 03/31/18 0900 03/31/18 0937  BP:    137/62  Pulse: 64  71 71  Resp: 16  (!) 22   Temp:    98.2 F (36.8 C)  TempSrc:    Oral  SpO2: 95% 97% 96% 99%  Weight:      Height:        Intake/Output Summary (Last 24 hours) at 03/31/2018 1128 Last data filed at 03/31/2018 0730 Gross per 24 hour  Intake 360 ml  Output 1400 ml  Net -1040 ml   Filed Weights   03/29/18 0652 03/30/18 0339 03/31/18 0423  Weight: 105.9 kg 104.6 kg 105.3 kg    Physical Exam   General appearance: alert and no  distress Neck: no carotid bruit, no JVD and thyroid not enlarged, symmetric, no tenderness/mass/nodules Lungs: clear to auscultation bilaterally Heart: regular rate and rhythm, S1, S2 normal, no murmur, click, rub or gallop Abdomen: soft, non-tender; bowel sounds normal; no masses,  no organomegaly Extremities: extremities normal, atraumatic, no cyanosis or edema Pulses: 2+ and symmetric Skin: Skin color, texture, turgor normal. No rashes or lesions Neurologic: Grossly normal Psych: Plesant   Labs    Chemistry Recent Labs  Lab 03/26/18 1615  03/29/18 0414 03/30/18 0248 03/31/18 0429  NA 136   < > 136 135 133*  K 4.7   < > 3.9 3.9 3.9  CL 103   < > 97* 96* 93*  CO2 23   < > 27 26 27   GLUCOSE 147*   < > 184* 169* 189*  BUN 30*   < > 30* 37* 39*  CREATININE 2.06*   < > 1.96* 2.23* 2.45*  CALCIUM 8.7*   < > 8.9 8.7* 8.8*  PROT 6.6  --   --   --   --   ALBUMIN 3.0*  --   --   --   --  AST 68*  --   --   --   --   ALT 48*  --   --   --   --   ALKPHOS 238*  --   --   --   --   BILITOT 0.9  --   --   --   --   GFRNONAA 23*   < > 24* 21* 18*  GFRAA 26*   < > 28* 24* 21*  ANIONGAP 10   < > 12 13 13    < > = values in this interval not displayed.    Hematology Recent Labs  Lab 03/26/18 1615  WBC 6.8  RBC 3.05*  HGB 8.9*  HCT 28.7*  MCV 94.1  MCH 29.2  MCHC 31.0  RDW 14.4  PLT 203   Cardiac Enzymes Recent Labs  Lab 03/26/18 2237 03/27/18 0238 03/27/18 1037  TROPONINI <0.03 <0.03 <0.03    Recent Labs  Lab 03/26/18 1631  TROPIPOC 0.01    BNP Recent Labs  Lab 03/26/18 1615  BNP 264.5*     DDimer No results for input(s): DDIMER in the last 168 hours.   Radiology    No results found.  Telemetry    Sinus rhythm - Personally Reviewed  ECG    N/A  Cardiac Studies   ECHO: 02/12/2018: Study Conclusions  - Left ventricle: The cavity size was normal. Wall thickness was increased in a pattern of mild LVH. Systolic function was normal. The  estimated ejection fraction was in the range of 55% to 60%. Wall motion was normal; there were no regional wall motion abnormalities. Features are consistent with a pseudonormal left ventricular filling pattern, with concomitant abnormal relaxation and increased filling pressure (grade 2 diastolic dysfunction). - Aortic valve: There was trivial regurgitation. - Left atrium: The atrium was mildly dilated. - Pulmonary arteries: Systolic pressure was mildly to moderately increased. PA peak pressure: 47 mm Hg (S).  Impressions:  - Normal LV systolic function; mild LVH; moderate diastolic dysfunction; trace AI; mild LAE; trace TR; mild to moderate pulmonary hypertension  Patient Profile     77 y.o. female with a hx of acute on chronic diastolic HF with normal LV function and G2 DD, atrial flutter, DM 2, CKD stage III with partial left nephrectomy about 9 years ago, HTN, abnormal LFTs, autoimmune thyroiditis, COPD, GERD, history of meningioma and craniotomy who is being seen today for the evaluation of acute on chronic diastolic CHF exacerbation at the request of Dr. Karleen Hampshire.  Assessment & Plan    1.Acute on chronic diastolic hearty failure: -Weight, 233lb today, down from 238lb on hospital presentation  -I&O, net negative 4.7L since admission, -1.7L yesterday  -Creatinine improving>>1.99>>1.96 -BMET in AM  -Creatinine rising - hold lasix today, resume 40 mg BID lasix tomorrow.  2.  CKD stage III: -Creatinine, 1.96 today with a baseline of 1.5-2.5 -Followed by outpatient nephrology -Last seen by nephrology 03/18/2018 with escalating diuretic therapy  -Consider nephrology consultation for diuretic assistance  - Creatinine trending up further - hold diuretics today - resume 40 mg BID lasix tomorrow  3. History of atrial flutter: -NSR today with rate control in the 60's  -Continue amiodarone, carvedilol 12.5 -Not on anticoagulation secondary to meningioma and frequent  falls -Monitor LFT's with amiodarone   4.  History of COPD: -Per internal medicine -Bronchodilators as needed -Resp status stable at this time   5.  HLD: -Statin on hold secondary to transaminitis - although enzyme elevation is mild and <  3x ULN, consider resuming if underlying steatohepatitis is the cause - this is not a contraindication to statins  6.  Hypertension: -Elevated, 172/65>182/69>171/66>163/73 -Not much improvement in BP with the transition to carvedilol from metoprolol  -BP improved today- hydralazine increased.  Pixie Casino, MD, [facility] [place] [facility] Director of the [facility] Lipid [facility] &  Card[facility] Diplomate of the American Board of Clinical Lipidology Attending Cardiologist  Direct Dial: [phone]  Fax: [phone]  Website:  [email or web address]  03/31/2018, 11:28 AM

## 2018-04-01 LAB — COMPREHENSIVE METABOLIC PANEL
ALT: 30 U/L (ref 0–44)
AST: 35 U/L (ref 15–41)
Albumin: 2.8 g/dL — ABNORMAL LOW (ref 3.5–5.0)
Alkaline Phosphatase: 232 U/L — ABNORMAL HIGH (ref 38–126)
Anion gap: 10 (ref 5–15)
BUN: 41 mg/dL — ABNORMAL HIGH (ref 8–23)
CHLORIDE: 93 mmol/L — AB (ref 98–111)
CO2: 28 mmol/L (ref 22–32)
Calcium: 8.8 mg/dL — ABNORMAL LOW (ref 8.9–10.3)
Creatinine, Ser: 2.58 mg/dL — ABNORMAL HIGH (ref 0.44–1.00)
GFR calc Af Amer: 20 mL/min — ABNORMAL LOW (ref >60)
GFR calc non Af Amer: 17 mL/min — ABNORMAL LOW (ref >60)
Glucose, Bld: 172 mg/dL — ABNORMAL HIGH (ref 70–99)
Potassium: 4 mmol/L (ref 3.5–5.1)
SODIUM: 131 mmol/L — AB (ref 135–145)
Total Bilirubin: 0.8 mg/dL (ref 0.3–1.2)
Total Protein: 6.4 g/dL — ABNORMAL LOW (ref 6.5–8.1)

## 2018-04-01 LAB — CBC WITH DIFFERENTIAL/PLATELET
Abs Immature Granulocytes: 0.19 10*3/uL — ABNORMAL HIGH (ref 0.00–0.07)
Basophils Absolute: 0 10*3/uL (ref 0.0–0.1)
Basophils Relative: 0 %
EOS ABS: 0.3 10*3/uL (ref 0.0–0.5)
Eosinophils Relative: 5 %
HCT: 28.1 % — ABNORMAL LOW (ref 36.0–46.0)
Hemoglobin: 8.9 g/dL — ABNORMAL LOW (ref 12.0–15.0)
Immature Granulocytes: 3 %
LYMPHS ABS: 1.9 10*3/uL (ref 0.7–4.0)
LYMPHS PCT: 30 %
MCH: 28.6 pg (ref 26.0–34.0)
MCHC: 31.7 g/dL (ref 30.0–36.0)
MCV: 90.4 fL (ref 80.0–100.0)
Monocytes Absolute: 0.4 10*3/uL (ref 0.1–1.0)
Monocytes Relative: 6 %
Neutro Abs: 3.5 10*3/uL (ref 1.7–7.7)
Neutrophils Relative %: 56 %
Platelets: 206 10*3/uL (ref 150–400)
RBC: 3.11 MIL/uL — ABNORMAL LOW (ref 3.87–5.11)
RDW: 13.8 % (ref 11.5–15.5)
WBC: 6.3 10*3/uL (ref 4.0–10.5)
nRBC: 0 % (ref 0.0–0.2)

## 2018-04-01 LAB — GLUCOSE, CAPILLARY
GLUCOSE-CAPILLARY: 206 mg/dL — AB (ref 70–99)
Glucose-Capillary: 189 mg/dL — ABNORMAL HIGH (ref 70–99)
Glucose-Capillary: 170 mg/dL — ABNORMAL HIGH (ref 70–99)
Glucose-Capillary: 181 mg/dL — ABNORMAL HIGH (ref 70–99)

## 2018-04-01 NOTE — Progress Notes (Signed)
PROGRESS NOTE    Morgan [name]  KXF:[id number] DOB: 02/16/1941 DOA: 03/26/2018 PCP: [name]., MD   Brief Narrative:  Morgan [name] is a 77 y.o. female with medical history significant of abnormal LFTs, acquired autoimmune hypothyroidism, basal cell carcinoma of the chest, breast cancer, combined hyperlipidemia, COPD, depression, type 2 diabetes, diabetic peripheral neuropathy, goiter, gout, GERD, history of meningioma and craniotomy, hypertension, history of hypoglycemia associated with diabetes, obesity, renal insufficiency, history of seizures, right shoulder fracture, obstructive sleep apnea, vertigo who was brought to the emergency department due to Progressively worse shortness of breath for the past 2 days.  He was admitted for management of acute on chronic diastolic heart failure.   Assessment & Plan:   Principal Problem:   Acute on chronic diastolic congestive heart failure (HCC) Active Problems:   Mixed hyperlipidemia   Abnormal liver function tests   Type 2 diabetes mellitus with polyneuropathy (HCC)   COPD (chronic obstructive pulmonary disease) (HCC)   Hypothyroidism, acquired, autoimmune   Essential hypertension   Gastroesophageal reflux disease without esophagitis   Atrial flutter (HCC)   Depression   CHF (congestive heart failure) (HCC)   CKD (chronic kidney disease), stage III (HCC)  Acute on chronic diastolic heart failure Admitted for diuresis She was started on  IV Lasix 40 mg twice daily later on was increased to 80 mg twice daily.  Due to bump in creatinine we have continued to hold the lasix.   Recheck creatinine tomorrow and if creatinine stabilized, then will resume lasix at 40 mg BID.  Continue with oxygen as needed to keep sats greater than 90%. Continue to monitor daily weights with strict intake and output.      Recent echocardiogram last month Wall thickness was   increased in a pattern of mild LVH. Systolic function was normal.   The  estimated ejection fraction was in the range of 55% to 60%.   Wall motion was normal; there were no regional wall motion   abnormalities. Features are consistent with a pseudonormal left   ventricular filling pattern, with concomitant abnormal relaxation   and increased filling pressure (grade 2 diastolic dysfunction). - Aortic valve: There was trivial regurgitation.   Cardiology consulted and recommendations given.  Net negative balance of 10 L since admission.   History of atrial flutter In sinus rhythm.  Continue with amiodarone and metoprolol.  Patient is not on anticoagulation given history of meningioma and frequent falls.  History of COPD Bronchodilators as needed.  No wheezing heard on exam today    Mildly elevated liver enzymes Unclear etiology versus amiodarone toxicity. Recheck liver panel wnl except for mild elevation of alk phos. She remains asymptomatic.    Hyperlipidemia Statin on hold given transaminitis. Plan to resume statins ondischarge as liver enzymes have normalized.     Hypertension  better controlled today Currently on Coreg and hydralazine 50 mg 3 times daily .  Constipation Resume Senokot and Amitiza.   Hypothyroidism Acquired and autoimmune. Continue with Synthroid 200 MCG daily.   stage III CKD Creatinine increased to 2.5 from overdiuresis    Depression Continue with Cymbalta Abilify and Lamictal.   History of meningioma and seizures. Status post resection of large frontal meningioma in January 2016.  On lamotrigine 800 mg twice daily..  DVT prophylaxis: On heparin subcu Code Status: DNR Family Communication none at bedside today disposition Plan: SNF when clinically stable.   Consultants:   Cardiology Dr. Duwayne Heck   Procedures: None  Antimicrobials: None  Subjective: No new complaints today. Family and the patient want to know when they can be discharged. We are currently waiting for insurance auth.   Objective: Vitals:   03/31/18 2357 04/01/18 0535 04/01/18 0817 04/01/18 1117  BP: (!) 147/58 (!) 146/53 (!) 154/51 (!) 148/66  Pulse: 63 60 64 68  Resp: (!) 22 18 18  (!) 21  Temp: 98.8 F (37.1 C) 97.9 F (36.6 C)  97.8 F (36.6 C)  TempSrc: Oral Oral  Oral  SpO2: 94% 98% 97% 96%  Weight:  103.8 kg    Height:        Intake/Output Summary (Last 24 hours) at 04/01/2018 1640 Last data filed at 04/01/2018 1536 Gross per 24 hour  Intake 240 ml  Output 2400 ml  Net -2160 ml   Filed Weights   03/30/18 0339 03/31/18 0423 04/01/18 0535  Weight: 104.6 kg 105.3 kg 103.8 kg    Examination:  General exam: Alert and comfortable distress noted Respiratory system: Clear to auscultation bilaterally no wheezing or rhonchi  Cardiovascular system: S1 & S2 heard, RRR. No JVD, Gastrointestinal system: Abdomen is soft, nt nd bs+  Central nervous system: Alert and oriented to person only Extremities: Trace pedal edema Skin: No rashes, lesions or ulcers Psychiatry: Mood & affect appropriate.     Data Reviewed: I have personally reviewed following labs and imaging studies  CBC: Recent Labs  Lab 03/26/18 1615 04/01/18 0355  WBC 6.8 6.3  NEUTROABS 4.6 3.5  HGB 8.9* 8.9*  HCT 28.7* 28.1*  MCV 94.1 90.4  PLT 203 536   Basic Metabolic Panel: Recent Labs  Lab 03/28/18 0250 03/29/18 0414 03/30/18 0248 03/31/18 0429 04/01/18 0355  NA 137 136 135 133* 131*  K 4.3 3.9 3.9 3.9 4.0  CL 102 97* 96* 93* 93*  CO2 24 27 26 27 28   GLUCOSE 151* 184* 169* 189* 172*  BUN 30* 30* 37* 39* 41*  CREATININE 1.99* 1.96* 2.23* 2.45* 2.58*  CALCIUM 8.8* 8.9 8.7* 8.8* 8.8*   GFR: Estimated Creatinine Clearance: 21.4 mL/min (A) (by C-G formula based on SCr of 2.58 mg/dL (H)). Liver Function Tests: Recent Labs  Lab 03/26/18 1615 04/01/18 0355  AST 68* 35  ALT 48* 30  ALKPHOS 238* 232*  BILITOT 0.9 0.8  PROT 6.6 6.4*  ALBUMIN 3.0* 2.8*   No results for input(s): LIPASE, AMYLASE in the  last 168 hours. No results for input(s): AMMONIA in the last 168 hours. Coagulation Profile: No results for input(s): INR, PROTIME in the last 168 hours. Cardiac Enzymes: Recent Labs  Lab 03/26/18 2237 03/27/18 0238 03/27/18 1037  TROPONINI <0.03 <0.03 <0.03   BNP (last 3 results) No results for input(s): PROBNP in the last 8760 hours. HbA1C: No results for input(s): HGBA1C in the last 72 hours. CBG: Recent Labs  Lab 03/31/18 1708 03/31/18 2124 04/01/18 0615 04/01/18 1114 04/01/18 1628  GLUCAP 173* 142* 189* 206* 170*   Lipid Profile: No results for input(s): CHOL, HDL, LDLCALC, TRIG, CHOLHDL, LDLDIRECT in the last 72 hours. Thyroid Function Tests: No results for input(s): TSH, T4TOTAL, FREET4, T3FREE, THYROIDAB in the last 72 hours. Anemia Panel: No results for input(s): VITAMINB12, FOLATE, FERRITIN, TIBC, IRON, RETICCTPCT in the last 72 hours. Sepsis Labs: No results for input(s): PROCALCITON, LATICACIDVEN in the last 168 hours.  No results found for this or any previous visit (from the past 240 hour(s)).       Radiology Studies: No results found.      Scheduled Meds: .  amiodarone  100 mg Oral Daily  . amLODipine  10 mg Oral Daily  . ARIPiprazole  5 mg Oral QPM  . aspirin EC  81 mg Oral Daily  . carvedilol  12.5 mg Oral BID WC  . chlorhexidine  15 mL Mouth Rinse BID  . cloNIDine  0.3 mg Transdermal Q Fri  . docusate sodium  100 mg Oral BID  . DULoxetine  60 mg Oral BID  . heparin  5,000 Units Subcutaneous Q8H  . hydrALAZINE  50 mg Oral TID  . insulin aspart  0-15 Units Subcutaneous TID WC  . lamoTRIgine  100 mg Oral BID  . levothyroxine  200 mcg Oral QAC breakfast  . liraglutide  1.8 mg Subcutaneous Daily  . lubiprostone  8 mcg Oral BID WC  . mouth rinse  15 mL Mouth Rinse q12n4p  . mometasone-formoterol  2 puff Inhalation BID  . nystatin   Topical TID  . pantoprazole  40 mg Oral BID  . senna  2 tablet Oral QHS  . vitamin B-12  1,000 mcg Oral  Daily   Continuous Infusions:   LOS: 5 days    Time spent: 25 minutes   Hosie Poisson, MD [facility] Pager [phone]  If 7PM-7AM, please contact night-coverage [email or web address] Password [facility] 04/01/2018, 4:40 PM

## 2018-04-01 NOTE — Progress Notes (Signed)
Progress Note  Patient Name: Morgan [name] Date of Encounter: 04/01/2018  Primary Cardiologist: Dr. Phill Mutter, MD   Subjective   Creatinine higher today.  Patient feels that her breathing is not quite back to baseline.  However, her baseline was very poor, getting short of breath walking room to room.  Inpatient Medications    Scheduled Meds: . amiodarone  100 mg Oral Daily  . amLODipine  10 mg Oral Daily  . ARIPiprazole  5 mg Oral QPM  . aspirin EC  81 mg Oral Daily  . carvedilol  12.5 mg Oral BID WC  . chlorhexidine  15 mL Mouth Rinse BID  . cloNIDine  0.3 mg Transdermal Q Fri  . docusate sodium  100 mg Oral BID  . DULoxetine  60 mg Oral BID  . furosemide  40 mg Oral BID  . heparin  5,000 Units Subcutaneous Q8H  . hydrALAZINE  50 mg Oral TID  . insulin aspart  0-15 Units Subcutaneous TID WC  . lamoTRIgine  100 mg Oral BID  . levothyroxine  200 mcg Oral QAC breakfast  . liraglutide  1.8 mg Subcutaneous Daily  . lubiprostone  8 mcg Oral BID WC  . mouth rinse  15 mL Mouth Rinse q12n4p  . mometasone-formoterol  2 puff Inhalation BID  . nystatin   Topical TID  . pantoprazole  40 mg Oral BID  . senna  2 tablet Oral QHS  . vitamin B-12  1,000 mcg Oral Daily   Continuous Infusions:  PRN Meds: acetaminophen **OR** acetaminophen, ALPRAZolam, benzonatate, colchicine, guaiFENesin-dextromethorphan, ipratropium-albuterol, nitroGLYCERIN, ondansetron **OR** ondansetron (ZOFRAN) IV   Vital Signs    Vitals:   03/31/18 2357 04/01/18 0535 04/01/18 0817 04/01/18 1117  BP: (!) 147/58 (!) 146/53 (!) 154/51 (!) 148/66  Pulse: 63 60 64 68  Resp: (!) 22 18 18  (!) 21  Temp: 98.8 F (37.1 C) 97.9 F (36.6 C)  97.8 F (36.6 C)  TempSrc: Oral Oral  Oral  SpO2: 94% 98% 97% 96%  Weight:  103.8 kg    Height:        Intake/Output Summary (Last 24 hours) at 04/01/2018 1135 Last data filed at 04/01/2018 0900 Gross per 24 hour  Intake 480 ml  Output 2300 ml  Net -1820 ml     Filed Weights   03/30/18 0339 03/31/18 0423 04/01/18 0535  Weight: 104.6 kg 105.3 kg 103.8 kg    Physical Exam   General appearance: alert, no distress and moderately obese Neck: no carotid bruit, no JVD, thyroid not enlarged, symmetric, no tenderness/mass/nodules and Not able to assess JVD due to body habitus Lungs: diminished breath sounds bibasilar Heart: regular rate and rhythm, S1, S2 normal, no murmur, click, rub or gallop Abdomen: soft, non-tender; bowel sounds normal; no masses,  no organomegaly Extremities: extremities normal, atraumatic, no cyanosis or edema and edema None Pulses: 2+ and symmetric Skin: Skin color, texture, turgor normal. No rashes or lesions Neurologic: Grossly normal Psych: Plesant   Labs    Chemistry Recent Labs  Lab 03/26/18 1615  03/30/18 0248 03/31/18 0429 04/01/18 0355  NA 136   < > 135 133* 131*  K 4.7   < > 3.9 3.9 4.0  CL 103   < > 96* 93* 93*  CO2 23   < > 26 27 28   GLUCOSE 147*   < > 169* 189* 172*  BUN 30*   < > 37* 39* 41*  CREATININE 2.06*   < >  2.23* 2.45* 2.58*  CALCIUM 8.7*   < > 8.7* 8.8* 8.8*  PROT 6.6  --   --   --  6.4*  ALBUMIN 3.0*  --   --   --  2.8*  AST 68*  --   --   --  35  ALT 48*  --   --   --  30  ALKPHOS 238*  --   --   --  232*  BILITOT 0.9  --   --   --  0.8  GFRNONAA 23*   < > 21* 18* 17*  GFRAA 26*   < > 24* 21* 20*  ANIONGAP 10   < > 13 13 10    < > = values in this interval not displayed.    Hematology Recent Labs  Lab 03/26/18 1615 04/01/18 0355  WBC 6.8 6.3  RBC 3.05* 3.11*  HGB 8.9* 8.9*  HCT 28.7* 28.1*  MCV 94.1 90.4  MCH 29.2 28.6  MCHC 31.0 31.7  RDW 14.4 13.8  PLT 203 206   Cardiac Enzymes Recent Labs  Lab 03/26/18 2237 03/27/18 0238 03/27/18 1037  TROPONINI <0.03 <0.03 <0.03    Recent Labs  Lab 03/26/18 1631  TROPIPOC 0.01    BNP Recent Labs  Lab 03/26/18 1615  BNP 264.5*     DDimer No results for input(s): DDIMER in the last 168 hours.   Radiology    No  results found.  Telemetry    Sinus rhythm, sinus bradycardia, first-degree block- Personally Reviewed  ECG    N/A  Cardiac Studies   ECHO: 02/12/2018: Study Conclusions  - Left ventricle: The cavity size was normal. Wall thickness was increased in a pattern of mild LVH. Systolic function was normal. The estimated ejection fraction was in the range of 55% to 60%. Wall motion was normal; there were no regional wall motion abnormalities. Features are consistent with a pseudonormal left ventricular filling pattern, with concomitant abnormal relaxation and increased filling pressure (grade 2 diastolic dysfunction). - Aortic valve: There was trivial regurgitation. - Left atrium: The atrium was mildly dilated. - Pulmonary arteries: Systolic pressure was mildly to moderately increased. PA peak pressure: 47 mm Hg (S).  Impressions:  - Normal LV systolic function; mild LVH; moderate diastolic dysfunction; trace AI; mild LAE; trace TR; mild to moderate pulmonary hypertension  Patient Profile     77 y.o. female with a hx of acute on chronic diastolic HF with normal LV function and G2 DD, atrial flutter, DM 2, CKD stage III with partial left nephrectomy about 9 years ago, HTN, abnormal LFTs, autoimmune thyroiditis, COPD, GERD, history of meningioma and craniotomy who is being seen today for the evaluation of acute on chronic diastolic CHF exacerbation at the request of Dr. Karleen Hampshire.  Assessment & Plan    1.Acute on chronic diastolic hearty failure: -Weight is down to 228 pounds today, down 14 pounds from admission. - I/O net -9.7 L since admission - Creatinine on admission to the hospital 02/09/2018 was 1.79.  During that admission, creatinine went to 2.2 with diuresis and then back down. - This admission, creatinine 2.06 on admission and down to 1.99 but then up to 2.58 today. -Hold Lasix until discussed with MD  2.  CKD stage III: -Prior to admission, diuretics  managed by nephrology - Chloride stable decreased at 93, creatinine trending up -May need to consult nephrology if creatinine goes higher  3. History of atrial flutter: -Currently maintaining sinus rhythm on amiodarone and carvedilol -  No anticoagulation secondary to a history of meningioma, fall risk -NSR today with rate control in the 60's  -Follow TSH and LFTs on amiodarone, yearly eye exam  4.  History of COPD: - Management per IM, no wheezing on exam  5.  HLD: - Mild elevation in LFTs, need to determine if steatohepatitis is the cause as this is not a contraindication to statins  6.  Hypertension: -Systolic blood pressure 527P-824M last 24 hours. -Hydralazine started 12/21, increase to 75 mg 3 times daily  Rosaria Ferries, PA-C 04/01/2018 12:10 PM Beeper [phone]

## 2018-04-01 NOTE — Progress Notes (Signed)
Patient walked from her room to the nurses station and back to room using a Rolator. She stopped and sat down on the rolator when she reached the nurses station. She complained of feeling winded. Upon reaching the room patient assisted to the recliner,no sign of distress noted,call bell within reach within reach will monitor.

## 2018-04-01 NOTE — Progress Notes (Signed)
Physical Therapy Treatment Patient Details Name: Morgan [name] MRN: [id number] DOB: 1940-04-25 Today's Date: 04/01/2018    History of Present Illness Pt with acute on chronic heart failure. PMH- chronic diastolic heart failure EF 65-70%, atrial flutter, arthrosclerotic CAD, CKD III/IV, pulmonary HTN, obstructive sleep apnea, DM2, hypothyroidism, nonalcoholic steatohepatitis, seizure disorder, emphysema, major depressive disorder, meningioma s/p right frontal craniotomy, breast cancer s/p left mastectomy and abnormal gait presenting for evaluation of SOB and anasarca.  Of note, pt recent psych admit due to suicidal ideation.     PT Comments    Pt was seen for mobility after being taken for a walk by nursing.  Her tolerance for activity was good so continued on with ROM and strengthening to increase LE strength and control.  Her plan is to continue to work on these goals acutely as well as standing balance and endurance.  Pt is motivated with good family support, and will anticipate continued progress given her resources for success.   Follow Up Recommendations  SNF     Equipment Recommendations  None recommended by PT    Recommendations for Other Services       Precautions / Restrictions Precautions Precautions: Fall Precaution Comments: telemetry Restrictions Weight Bearing Restrictions: No    Mobility  Bed Mobility               General bed mobility comments: in chair when PT arrived  Transfers                 General transfer comment: declined   Ambulation/Gait                 Stairs             Wheelchair Mobility    Modified Rankin (Stroke Patients Only)       Balance     Sitting balance-Leahy Scale: Good                                      Cognition Arousal/Alertness: Awake/alert Behavior During Therapy: WFL for tasks assessed/performed Overall Cognitive Status: Within Functional Limits for tasks assessed                                         Exercises General Exercises - Lower Extremity Ankle Circles/Pumps: AAROM;5 reps;Both Quad Sets: AROM;Both;10 reps Gluteal Sets: AROM;Both;10 reps Long Arc Quad: Strengthening;Both;10 reps Heel Slides: Strengthening;Both;10 reps Hip ABduction/ADduction: AROM;Both;10 reps Straight Leg Raises: AROM;Both;10 reps    General Comments        Pertinent Vitals/Pain Pain Assessment: No/denies pain    Home Living                      Prior Function            PT Goals (current goals can now be found in the care plan section) Acute Rehab PT Goals Patient Stated Goal: return home PT Goal Formulation: With patient Progress towards PT goals: Progressing toward goals    Frequency    Min 2X/week      PT Plan Current plan remains appropriate    Co-evaluation              AM-PAC PT "6 Clicks" Mobility   Outcome Measure  Help needed turning from your back to your  side while in a flat bed without using bedrails?: A Little Help needed moving from lying on your back to sitting on the side of a flat bed without using bedrails?: A Little Help needed moving to and from a bed to a chair (including a wheelchair)?: A Little Help needed standing up from a chair using your arms (e.g., wheelchair or bedside chair)?: A Little Help needed to walk in hospital room?: A Little Help needed climbing 3-5 steps with a railing? : A Lot 6 Click Score: 17    End of Session   Activity Tolerance: Patient limited by fatigue Patient left: in bed;with call bell/phone within reach;with family/visitor present Nurse Communication: Mobility status PT Visit Diagnosis: Unsteadiness on feet (R26.81);Other abnormalities of gait and mobility (R26.89)     Time: 3943-2003 PT Time Calculation (min) (ACUTE ONLY): 15 min  Charges:  $Therapeutic Exercise: 8-22 mins                    Ramond Dial 04/01/2018, 4:47 PM   Mee Hives, PT  MS Acute Rehab Dept. Number: [place]

## 2018-04-02 LAB — BASIC METABOLIC PANEL
Anion gap: 13 (ref 5–15)
BUN: 50 mg/dL — ABNORMAL HIGH (ref 8–23)
CHLORIDE: 94 mmol/L — AB (ref 98–111)
CO2: 22 mmol/L (ref 22–32)
Calcium: 8.6 mg/dL — ABNORMAL LOW (ref 8.9–10.3)
Creatinine, Ser: 2.5 mg/dL — ABNORMAL HIGH (ref 0.44–1.00)
GFR calc Af Amer: 21 mL/min — ABNORMAL LOW (ref >60)
GFR calc non Af Amer: 18 mL/min — ABNORMAL LOW (ref >60)
Glucose, Bld: 268 mg/dL — ABNORMAL HIGH (ref 70–99)
Potassium: 4.5 mmol/L (ref 3.5–5.1)
Sodium: 129 mmol/L — ABNORMAL LOW (ref 135–145)

## 2018-04-02 LAB — GLUCOSE, CAPILLARY
GLUCOSE-CAPILLARY: 192 mg/dL — AB (ref 70–99)
Glucose-Capillary: 228 mg/dL — ABNORMAL HIGH (ref 70–99)

## 2018-04-02 MED ORDER — BENZONATATE 100 MG PO CAPS: 100.0000 mg | ORAL_CAPSULE | Freq: Three times a day (TID) | ORAL | 0 refills | Status: DC | PRN

## 2018-04-02 MED ORDER — FUROSEMIDE 40 MG PO TABS: 40.0000 mg | ORAL_TABLET | Freq: Two times a day (BID) | ORAL | 0 refills | Status: DC

## 2018-04-02 MED ORDER — IPRATROPIUM-ALBUTEROL 0.5-2.5 (3) MG/3ML IN SOLN: 3.0000 mL | Freq: Four times a day (QID) | RESPIRATORY_TRACT | 1 refills | Status: DC | PRN

## 2018-04-02 MED ORDER — DOCUSATE SODIUM 100 MG PO CAPS: 100.0000 mg | ORAL_CAPSULE | Freq: Two times a day (BID) | ORAL | 0 refills | Status: DC

## 2018-04-02 MED ORDER — CALCIUM-VITAMIN D 500-200 MG-UNIT PO TABS
1.0000 | ORAL_TABLET | Freq: Every day | ORAL | Status: DC
  Administered 2018-04-02: 1 via ORAL
  Filled 2018-04-02: qty 1

## 2018-04-02 MED ORDER — CARVEDILOL 12.5 MG PO TABS: 12.5000 mg | ORAL_TABLET | Freq: Two times a day (BID) | ORAL | 0 refills | Status: DC

## 2018-04-02 MED ORDER — ADULT MULTIVITAMIN W/MINERALS CH
1.0000 | ORAL_TABLET | Freq: Every day | ORAL | Status: DC
  Administered 2018-04-02: 1 via ORAL
  Filled 2018-04-02: qty 1

## 2018-04-02 MED ORDER — HYDRALAZINE HCL 50 MG PO TABS: 50.0000 mg | ORAL_TABLET | Freq: Three times a day (TID) | ORAL | 0 refills | Status: DC

## 2018-04-02 NOTE — Discharge Summary (Signed)
Physician Discharge Summary  Morgan [name] XTK:[id number] DOB: Nov 13, 1940 DOA: 03/26/2018  PCP: [name]., MD  Admit date: 03/26/2018 Discharge date: 04/02/2018  Admitted From: SNF.  Disposition: SNF  Recommendations for Outpatient Follow-up:  Follow up with PCP in 1-2 weeks Please obtain BMP/CBC tomorrow and possibly once weekly.  Please follow up with cardiology in 1 to 2 weeks.  Please follow up with nephrology in one week.    Discharge Condition:stable  CODE STATUS: DNR Diet recommendation: Heart Healthy   Brief/Interim Summary: Morgan Christoffersen Langleyis a 77 y.o.femalewith medical history significant ofabnormal LFTs, acquired autoimmune hypothyroidism, basal cell carcinoma of the chest, breast cancer, combined hyperlipidemia, COPD, depression, type 2 diabetes, diabetic peripheral neuropathy, goiter, gout, GERD, history of meningioma and craniotomy, hypertension, history of hypoglycemia associated with diabetes, obesity, renal insufficiency, history of seizures, right shoulder fracture, obstructive sleep apnea, vertigo who was brought to the emergency department due toProgressively worse shortness of breathfor the past 2 days.  He was admitted for management of acute on chronic diastolic heart failure.  Discharge Diagnoses:  Principal Problem:   Acute on chronic diastolic congestive heart failure (HCC) Active Problems:   Mixed hyperlipidemia   Abnormal liver function tests   Type 2 diabetes mellitus with polyneuropathy (HCC)   COPD (chronic obstructive pulmonary disease) (HCC)   Hypothyroidism, acquired, autoimmune   Essential hypertension   Gastroesophageal reflux disease without esophagitis   Atrial flutter (HCC)   Depression   CHF (congestive heart failure) (HCC)   CKD (chronic kidney disease), stage III (HCC)   Acute on chronic diastolic heart failure Admitted for diuresis She was started on  IV Lasix 40 mg twice daily later on was increased to 80 mg twice  daily.  Due to bump in creatinine we held the lasix.    creatinine stable at 2.5, restarted the lasix at 40 mg BID.  Continue with oxygen as needed to keep sats greater than 90%. Continue to monitor daily weights with strict intake and output.      Recent echocardiogram last month Wall thickness was increased in a pattern of mild LVH. Systolic function was normal. The estimated ejection fraction was in the range of 55% to 60%. Wall motion was normal; there were no regional wall motion abnormalities. Features are consistent with a pseudonormal left ventricular filling pattern, with concomitant abnormal relaxation and increased filling pressure (grade 2 diastolic dysfunction). - Aortic valve: There was trivial regurgitation.   Cardiology consulted and recommendations given.  Net negative balance of 10 L since admission.   History of atrial flutter In sinus rhythm.  Continue with amiodarone and metoprolol.  Patient is not on anticoagulation given history of meningioma and frequent falls.  History of COPD Bronchodilators as needed.  No wheezing heard on exam today    Mildly elevated liver enzymes Unclear etiology versus amiodarone toxicity. Recheck liver panel wnl except for mild elevation of alk phos. She remains asymptomatic.    Hyperlipidemia Statin on hold given transaminitis. Plan to resume statins ondischarge as liver enzymes have normalized.     Hypertension  better controlled today Currently on Coreg and hydralazine 50 mg 3 times daily .  Constipation Resume Senokot and Amitiza.   Hypothyroidism Acquired and autoimmune. Continue with Synthroid 200 MCG daily.   stage III CKD Creatinine increased to 2.5 from overdiuresis    Depression Continue with Cymbalta Abilify and Lamictal.   History of meningioma and seizures. Status post resection of large frontal meningioma in January 2016.  On lamotrigine  800 mg twice  daily.Morgan [name]  Discharge Instructions  Discharge Instructions    Diet - low sodium heart healthy   Complete by:  As directed    Discharge instructions   Complete by:  As directed    Follow up with PCP in one week.  Please follow up with cardiology as recommended.  You will need follow up with BMP tomorrow and if creatinine is stable around 2. Please restart the lasix.     Allergies as of 04/02/2018      Reactions   Latex Rash   Wellbutrin [bupropion] Other (See Comments)   Unable to stand.   Keppra [levetiracetam] Other (See Comments)   Dementia.   Statins    Byetta 10 Mcg Pen [exenatide] Rash   Metformin Rash   RENAL INSUFFICIENCY-takes low dose   Penicillins Rash   Sulfa Antibiotics Other (See Comments)   Unknown allergic reaction      Medication List    STOP taking these medications   metoprolol tartrate 50 MG tablet Commonly known as:  LOPRESSOR     TAKE these medications   acetaminophen 325 MG tablet Commonly known as:  TYLENOL Take 650 mg by mouth every 6 (six) hours as needed for mild pain.   albuterol (2.5 MG/3ML) 0.083% nebulizer solution Commonly known as:  PROVENTIL Take 3 mLs (2.5 mg total) by nebulization every 2 (two) hours as needed for shortness of breath.   ALPRAZolam 0.5 MG tablet Commonly known as:  XANAX Take 0.5 mg by mouth every 8 (eight) hours as needed for anxiety.   amiodarone 200 MG tablet Commonly known as:  PACERONE Take 0.5 tablets (100 mg total) by mouth daily. TAKE 1/2 TABLET (200 MG TOTAL) BY MOUTH DAILY.   amLODipine 10 MG tablet Commonly known as:  NORVASC Take 1 tablet (10 mg total) by mouth daily.   ARIPiprazole 5 MG tablet Commonly known as:  ABILIFY Take 5 mg by mouth every evening.   aspirin EC 81 MG tablet Take 1 tablet (81 mg total) by mouth daily.   benzonatate 100 MG capsule Commonly known as:  TESSALON Take 1 capsule (100 mg total) by mouth 3 (three) times daily as needed for cough.   CALCIUM 600+D 600-800  MG-UNIT Tabs Generic drug:  Calcium Carb-Cholecalciferol Take 1 tablet by mouth daily.   carvedilol 12.5 MG tablet Commonly known as:  COREG Take 1 tablet (12.5 mg total) by mouth 2 (two) times daily with a meal.   cloNIDine 0.3 mg/24hr patch Commonly known as:  CATAPRES - Dosed in mg/24 hr Place 0.3 mg onto the skin every Friday.   colchicine 0.6 MG tablet Take 0.6 mg by mouth daily as needed (for gout).   docusate sodium 100 MG capsule Commonly known as:  COLACE Take 1 capsule (100 mg total) by mouth 2 (two) times daily.   DULoxetine 60 MG capsule Commonly known as:  CYMBALTA Take 60 mg by mouth 2 (two) times daily.   furosemide 40 MG tablet Commonly known as:  LASIX Take 1 tablet (40 mg total) by mouth 2 (two) times daily. Start taking on:  April 03, 2018   HUMULIN R 500 UNIT/ML injection Generic drug:  insulin regular human CONCENTRATED Inject 5-10 Units into the skin 3 (three) times daily with meals. Sliding scale:   NOTE:  Doses below are where U-100 syringe is drawn to; actually provides 5 times more insulin since using U-500 150 and below no Insulin  Breakfast  151-200 = 9 units 201-250=  10 units 251-300= 11 units 301-350=12 units 351-400= 13 units 4001-455= 14 units Above 450 = 15 units  Lunch  151-200= 8 units 201-250= 9 units 251-300= 10 units 301-350= 11 units 351-400= 12 units 401-450= 13 units Above 450 = 14 units  Supper 151-200= 4 units 201-250= 5 units 251-300= 6 units 301-350=7 units 351-400= 8 units 401-450= 9 units Above 450 = 10 units  NO INSULIN AFTER SUPPER, EVEN IF IT'S HIGH BECAUSE HER LEVELS WILL DROP DURING THE NIGHT. PLEASE GIVE A SNACK   hydrALAZINE 50 MG tablet Commonly known as:  APRESOLINE Take 1 tablet (50 mg total) by mouth 3 (three) times daily.   ipratropium-albuterol 0.5-2.5 (3) MG/3ML Soln Commonly known as:  DUONEB Take 3 mLs by nebulization every 6 (six) hours as needed.   lamoTRIgine 100 MG  tablet Commonly known as:  LAMICTAL Take 100 mg by mouth 2 (two) times daily.   levothyroxine 200 MCG tablet Commonly known as:  SYNTHROID, LEVOTHROID Take 1 tablet (200 mcg total) by mouth daily before breakfast. What changed:    when to take this  additional instructions   lubiprostone 8 MCG capsule Commonly known as:  AMITIZA Take 8 mcg by mouth 2 (two) times daily with a meal.   multivitamin with minerals Tabs tablet Take 1 tablet by mouth daily.   nitroGLYCERIN 0.4 MG SL tablet Commonly known as:  NITROSTAT Place 0.4 mg under the tongue every 5 (five) minutes as needed for chest pain.   nystatin powder Commonly known as:  MYCOSTATIN/NYSTOP Apply 1 g topically daily as needed (irritation).   nystatin ointment Commonly known as:  MYCOSTATIN Apply 1 application topically 2 (two) times daily.   pantoprazole 40 MG tablet Commonly known as:  PROTONIX Take 40 mg by mouth 2 (two) times daily.   senna 8.6 MG Tabs tablet Commonly known as:  SENOKOT Take 2 tablets by mouth at bedtime.   SYMBICORT 80-4.5 MCG/ACT inhaler Generic drug:  budesonide-formoterol Inhale 2 puffs into the lungs 2 (two) times daily.   VICTOZA 18 MG/3ML Sopn Generic drug:  liraglutide Inject 1.8 mg into the skin daily. Must take at with First dose if Inulin Must eat after   vitamin B-12 1000 MCG tablet Commonly known as:  CYANOCOBALAMIN Take 1,000 mcg by mouth daily.       Contact information for follow-up providers    [name]., MD. Schedule an appointment as soon as possible for a visit in 1 week(s).   Specialty:  Internal Medicine Contact information: [place] [address] [phone]        [name], MD. Schedule an appointment as soon as possible for a visit in 1 week(s).   Specialty:  Cardiology Contact information: [address] [place] [address] [phone]            Contact information for after-discharge care     Destination    [facility] [place] [facility] .   Service:  Skilled Nursing Contact information: [place] [address] [phone]                 Allergies  Allergen Reactions  . Latex Rash  . Wellbutrin [Bupropion] Other (See Comments)    Unable to stand.   . Keppra [Levetiracetam] Other (See Comments)    Dementia.  . Statins   . Byetta 10 Mcg Pen [Exenatide] Rash  . Metformin Rash    RENAL INSUFFICIENCY-takes low dose  .  Penicillins Rash  . Sulfa Antibiotics Other (See Comments)    Unknown allergic reaction    Consultations:  Cardiology.   NEPHROLOGY.    Procedures/Studies: Dg Chest Port 1 View  Result Date: 03/26/2018 CLINICAL DATA:  Two day history of increased shortness of breath, history of CHF EXAM: PORTABLE CHEST 1 VIEW COMPARISON:  Chest x-ray of 02/11/2017 FINDINGS: On this portable erect chest x-ray, the lungs are not well aerated with mild basilar volume loss. No pneumonia or pleural effusion is seen. No present evidence of congestive heart failure is noted. Moderate cardiomegaly is stable. A lower anterior cervical spine fusion plate is present. Evidence of prior left shoulder surgery is noted with resection of the distal clavicle. IMPRESSION: 1. Poor inspiration.  No definite active process. 2. Stable moderate cardiomegaly. Electronically Signed   By: Ivar Drape M.D.   On: 03/26/2018 16:40       Subjective: No new complaints.   Discharge Exam: Vitals:   04/02/18 0713 04/02/18 0750  BP: (!) 149/58   Pulse: 62   Resp: (!) 24   Temp: 97.7 F (36.5 C)   SpO2: 96% 98%   Vitals:   04/01/18 2342 04/02/18 0425 04/02/18 0713 04/02/18 0750  BP: (!) 150/61 (!) 157/63 (!) 149/58   Pulse: 63 65 62   Resp: (!) 21 17 (!) 24   Temp: 98.1 F (36.7 C) 97.9 F (36.6 C) 97.7 F (36.5 C)   TempSrc: Oral Axillary Oral   SpO2: 96% 96% 96% 98%  Weight:  104.3 kg    Height:        General: Pt is alert, awake, not  in acute distress Cardiovascular: RRR, S1/S2 +, no rubs, no gallops Respiratory: CTA bilaterally, no wheezing, no rhonchi Abdominal: Soft, NT, ND, bowel sounds + Extremities: no edema, no cyanosis    The results of significant diagnostics from this hospitalization (including imaging, microbiology, ancillary and laboratory) are listed below for reference.     Microbiology: No results found for this or any previous visit (from the past 240 hour(s)).   Labs: BNP (last 3 results) Recent Labs    02/11/18 1725 03/26/18 1615  BNP 296.1* 482.7*   Basic Metabolic Panel: Recent Labs  Lab 03/29/18 0414 03/30/18 0248 03/31/18 0429 04/01/18 0355 04/02/18 1045  NA 136 135 133* 131* 129*  K 3.9 3.9 3.9 4.0 4.5  CL 97* 96* 93* 93* 94*  CO2 27 26 27 28 22   GLUCOSE 184* 169* 189* 172* 268*  BUN 30* 37* 39* 41* 50*  CREATININE 1.96* 2.23* 2.45* 2.58* 2.50*  CALCIUM 8.9 8.7* 8.8* 8.8* 8.6*   Liver Function Tests: Recent Labs  Lab 03/26/18 1615 04/01/18 0355  AST 68* 35  ALT 48* 30  ALKPHOS 238* 232*  BILITOT 0.9 0.8  PROT 6.6 6.4*  ALBUMIN 3.0* 2.8*   No results for input(s): LIPASE, AMYLASE in the last 168 hours. No results for input(s): AMMONIA in the last 168 hours. CBC: Recent Labs  Lab 03/26/18 1615 04/01/18 0355  WBC 6.8 6.3  NEUTROABS 4.6 3.5  HGB 8.9* 8.9*  HCT 28.7* 28.1*  MCV 94.1 90.4  PLT 203 206   Cardiac Enzymes: Recent Labs  Lab 03/26/18 2237 03/27/18 0238 03/27/18 1037  TROPONINI <0.03 <0.03 <0.03   BNP: Invalid input(s): POCBNP CBG: Recent Labs  Lab 04/01/18 1114 04/01/18 1628 04/01/18 2142 04/02/18 0626 04/02/18 1104  GLUCAP 206* 170* 181* 192* 228*   D-Dimer No results for input(s): DDIMER in the last 72  hours. Hgb A1c No results for input(s): HGBA1C in the last 72 hours. Lipid Profile No results for input(s): CHOL, HDL, LDLCALC, TRIG, CHOLHDL, LDLDIRECT in the last 72 hours. Thyroid function studies No results for input(s):  TSH, T4TOTAL, T3FREE, THYROIDAB in the last 72 hours.  Invalid input(s): FREET3 Anemia work up No results for input(s): VITAMINB12, FOLATE, FERRITIN, TIBC, IRON, RETICCTPCT in the last 72 hours. Urinalysis    Component Value Date/Time   COLORURINE YELLOW 02/10/2018 1550   APPEARANCEUR CLEAR 02/10/2018 1550   LABSPEC 1.014 02/10/2018 1550   PHURINE 7.0 02/10/2018 1550   GLUCOSEU 50 (A) 02/10/2018 1550   HGBUR NEGATIVE 02/10/2018 1550   BILIRUBINUR NEGATIVE 02/10/2018 1550   KETONESUR NEGATIVE 02/10/2018 1550   PROTEINUR >=300 (A) 02/10/2018 1550   UROBILINOGEN 0.2 01/15/2015 2141   NITRITE NEGATIVE 02/10/2018 1550   LEUKOCYTESUR NEGATIVE 02/10/2018 1550   Sepsis Labs Invalid input(s): PROCALCITONIN,  WBC,  LACTICIDVEN Microbiology No results found for this or any previous visit (from the past 240 hour(s)).   Time coordinating discharge: 32  minutes  SIGNED:   Hosie Poisson, MD  [facility] 04/02/2018, 11:35 AM Pager   If 7PM-7AM, please contact night-coverage [email or web address] Password [email or web address]

## 2018-04-02 NOTE — Progress Notes (Signed)
Clinical Social Worker facilitated patient discharge including contacting patient family and facility to confirm patient discharge plans.  Clinical information faxed to facility and family agreeable with plan.  Patients daughter stated she will transport patient to [facility] .  RN to call [phone] Ext: [phone] (pt will go in rm# [phone]) for report prior to discharge.  Clinical Social Worker will sign off for now as social work intervention is no longer needed. Please consult [place] again if new need arises.  Rhea Pink, MSW, [place]

## 2018-04-02 NOTE — Progress Notes (Signed)
AM labs pending - based on creatinine, consider restarting po lasix - will need further medical optimization, possible rehab. If creatinine rises, consider nephrology consult. No further suggestions at this time.   [facility] will sign off.   Medication Recommendations:  Likely lasix 40 mg BID or consider torsemide equivalent Other recommendations (labs, testing, etc):  none Follow up as an outpatient:  Dr. Levert Feinstein, MD, [facility], [place] Director of the Advanced Lipid Disorders &  Cardiovascular Risk Reduction Clinic Diplomate of the American Board of Clinical Lipidology Attending Cardiologist  Direct Dial: [phone]  Fax: [phone]  Website:  [email or web address]

## 2018-04-02 NOTE — Clinical Social Work Placement (Signed)
   CLINICAL SOCIAL WORK PLACEMENT  NOTE  Date:  04/02/2018  Patient Details  Name: Morgan [name] MRN: [id number] Date of Birth: 10/09/1940  Clinical Social Work is seeking post-discharge placement for this patient at the [place] level of care (*CSW will initial, date and re-position this form in  chart as items are completed):  Yes   Patient/family provided with [place] Work Department's list of facilities offering this level of care within the geographic area requested by the patient (or if unable, by the patient's family).  Yes   Patient/family informed of their freedom to choose among providers that offer the needed level of care, that participate in Medicare, Medicaid or managed care program needed by the patient, have an available bed and are willing to accept the patient.  Yes   Patient/family informed of [facility]'s ownership interest in [facility] and [facility], as well as of the fact that they are under no obligation to receive care at these facilities.  PASRR submitted to EDS on       PASRR number received on       Existing PASRR number confirmed on 04/02/18     FL2 transmitted to all facilities in geographic area requested by pt/family on 04/02/18     FL2 transmitted to all facilities within larger geographic area on       Patient informed that his/her managed care company has contracts with or will negotiate with certain facilities, including the following:            Patient/family informed of bed offers received.  Patient chooses bed at [facility], [facility]     Physician recommends and patient chooses bed at      Patient to be transferred to [place] on 04/02/18.  Patient to be transferred to facility by patients daughter will trasport her     Patient family notified on 04/02/18 of transfer.  Name of family member notified:  patient and daughter( was at bedside) both aware of discharge     PHYSICIAN     Additional Comment:    _______________________________________________ Wende Neighbors, LCSW 04/02/2018, 11:12 AM

## 2018-04-02 NOTE — Progress Notes (Signed)
Patient in a stable condition, discharge education reviewed with patient and her daughter, they verbalized understanding, patient belongings at bedside,tele dc ccmd notified, home meds returned to patient , patient to be transported to [place] by her daughter. Report called to Bl[facility] [employer] at [facility].

## 2018-04-19 ENCOUNTER — Ambulatory Visit (INDEPENDENT_AMBULATORY_CARE_PROVIDER_SITE_OTHER): Payer: Medicare Other | Admitting: Cardiology

## 2018-04-19 ENCOUNTER — Encounter: Payer: Self-pay | Admitting: Cardiology

## 2018-04-19 VITALS — BP 110/60 | HR 68 | Ht 64.0 in | Wt 239.8 lb

## 2018-04-19 DIAGNOSIS — J438 Other emphysema: Secondary | ICD-10-CM

## 2018-04-19 DIAGNOSIS — I10 Essential (primary) hypertension: Secondary | ICD-10-CM

## 2018-04-19 DIAGNOSIS — I5033 Acute on chronic diastolic (congestive) heart failure: Secondary | ICD-10-CM

## 2018-04-19 DIAGNOSIS — N184 Chronic kidney disease, stage 4 (severe): Secondary | ICD-10-CM

## 2018-04-19 DIAGNOSIS — Z6841 Body Mass Index (BMI) 40.0 and over, adult: Secondary | ICD-10-CM

## 2018-04-19 NOTE — Progress Notes (Signed)
04/19/2018 Morgan [name]   02/28/1941  [id number]  Primary Physician [name]., MD Primary Cardiologist: Dr Stanford Breed  HPI:  78 y/o female seen by Dr Doylene Canard in the past, then transferred to Dr Stanford Breed.  She has chronic diastolic CHF.  She was admitted to [facility] 03/27/18 with CHF.  Her admission weight was 242 lbs, discharge weight 228 lbs.  Other problems include PAF Oct (2016- not on anticoagulation secondary to falls), CRI-3, HTN, COPD, IDDM, and HLD with a history of elevated LFTs. She is in the office today for a post hospital follow up.  Her son accompanied her and her daughter participated over the phone by speaker.   The pt is in [facility] in [place].  She is followed by a nephrologist in [place], Dr Olivia Mackie, who saw her in the office yesterday.  The pt has gradually gained back weight since discharge.  She is on a fluid restriction and low sodium diet at the SNF.  Her lasix was increased last week to 60 mg BID and her daughter tells me sh has lost about 4 lbs since.  Dr Olivia Mackie increased her hydralazine to 100 mg TID.  Her B/P by me was 110/68.  The patient complains of LE edema.  She is in a wheel chair and has dyspnea with minimal exertion.    Current Outpatient Medications  Medication Sig Dispense Refill  . acetaminophen (TYLENOL) 325 MG tablet Take 650 mg by mouth every 6 (six) hours as needed for mild pain.     Marland Kitchen albuterol (PROVENTIL) (2.5 MG/3ML) 0.083% nebulizer solution Take 3 mLs (2.5 mg total) by nebulization every 2 (two) hours as needed for shortness of breath. (Patient not taking: Reported on 03/26/2018) 75 mL 12  . ALPRAZolam (XANAX) 0.5 MG tablet Take 0.5 mg by mouth every 8 (eight) hours as needed for anxiety.    Marland Kitchen amiodarone (PACERONE) 200 MG tablet Take 0.5 tablets (100 mg total) by mouth daily. TAKE 1/2 TABLET (200 MG TOTAL) BY MOUTH DAILY.    Marland Kitchen amLODipine (NORVASC) 10 MG tablet Take 1 tablet (10 mg total) by mouth daily.    . ARIPiprazole (ABILIFY) 5 MG  tablet Take 5 mg by mouth every evening.    Marland Kitchen aspirin EC 81 MG tablet Take 1 tablet (81 mg total) by mouth daily. 90 tablet 3  . benzonatate (TESSALON) 100 MG capsule Take 1 capsule (100 mg total) by mouth 3 (three) times daily as needed for cough. 20 capsule 0  . budesonide-formoterol (SYMBICORT) 80-4.5 MCG/ACT inhaler Inhale 2 puffs into the lungs 2 (two) times daily.    . Calcium Carb-Cholecalciferol (CALCIUM 600+D) 600-800 MG-UNIT TABS Take 1 tablet by mouth daily.    . carvedilol (COREG) 12.5 MG tablet Take 1 tablet (12.5 mg total) by mouth 2 (two) times daily with a meal. 60 tablet 0  . cloNIDine (CATAPRES - DOSED IN MG/24 HR) 0.3 mg/24hr patch Place 0.3 mg onto the skin every Friday.     . colchicine 0.6 MG tablet Take 0.6 mg by mouth daily as needed (for gout).     Marland Kitchen docusate sodium (COLACE) 100 MG capsule Take 1 capsule (100 mg total) by mouth 2 (two) times daily. 10 capsule 0  . DULoxetine (CYMBALTA) 60 MG capsule Take 60 mg by mouth 2 (two) times daily.     . furosemide (LASIX) 40 MG tablet Take 1 tablet (40 mg total) by mouth 2 (two) times daily. 60 tablet 0  . HUMULIN R  500 UNIT/ML injection Inject 5-10 Units into the skin 3 (three) times daily with meals. Sliding scale:   NOTE:  Doses below are where U-100 syringe is drawn to; actually provides 5 times more insulin since using U-500 150 and below no Insulin  Breakfast  151-200 = 9 units 201-250= 10 units 251-300= 11 units 301-350=12 units 351-400= 13 units 4001-455= 14 units Above 450 = 15 units  Lunch  151-200= 8 units 201-250= 9 units 251-300= 10 units 301-350= 11 units 351-400= 12 units 401-450= 13 units Above 450 = 14 units  Supper 151-200= 4 units 201-250= 5 units 251-300= 6 units 301-350=7 units 351-400= 8 units 401-450= 9 units Above 450 = 10 units  NO INSULIN AFTER SUPPER, EVEN IF IT'S HIGH BECAUSE HER LEVELS WILL DROP DURING THE NIGHT. PLEASE GIVE A SNACK  1  . hydrALAZINE (APRESOLINE) 50 MG tablet  Take 1 tablet (50 mg total) by mouth 3 (three) times daily. 90 tablet 0  . ipratropium-albuterol (DUONEB) 0.5-2.5 (3) MG/3ML SOLN Take 3 mLs by nebulization every 6 (six) hours as needed. 360 mL 1  . lamoTRIgine (LAMICTAL) 100 MG tablet Take 100 mg by mouth 2 (two) times daily.    Marland Kitchen levothyroxine (SYNTHROID, LEVOTHROID) 200 MCG tablet Take 1 tablet (200 mcg total) by mouth daily before breakfast. (Patient taking differently: Take 200 mcg by mouth See admin instructions. Take 2 tablets on Sunday then take 1 tablet on all other days) 90 tablet 3  . liraglutide (VICTOZA) 18 MG/3ML SOPN Inject 1.8 mg into the skin daily. Must take at with First dose if Inulin Must eat after    . lubiprostone (AMITIZA) 8 MCG capsule Take 8 mcg by mouth 2 (two) times daily with a meal.    . Multiple Vitamin (MULTIVITAMIN WITH MINERALS) TABS tablet Take 1 tablet by mouth daily.    . nitroGLYCERIN (NITROSTAT) 0.4 MG SL tablet Place 0.4 mg under the tongue every 5 (five) minutes as needed for chest pain.    Marland Kitchen nystatin (MYCOSTATIN/NYSTOP) powder Apply 1 g topically daily as needed (irritation).    . nystatin ointment (MYCOSTATIN) Apply 1 application topically 2 (two) times daily.    . pantoprazole (PROTONIX) 40 MG tablet Take 40 mg by mouth 2 (two) times daily.     Marland Kitchen senna (SENOKOT) 8.6 MG TABS tablet Take 2 tablets by mouth at bedtime.     . vitamin B-12 (CYANOCOBALAMIN) 1000 MCG tablet Take 1,000 mcg by mouth daily.     No current facility-administered medications for this visit.     Allergies  Allergen Reactions  . Latex Rash  . Wellbutrin [Bupropion] Other (See Comments)    Unable to stand.   . Keppra [Levetiracetam] Other (See Comments)    Dementia.  . Statins   . Byetta 10 Mcg Pen [Exenatide] Rash  . Metformin Rash    RENAL INSUFFICIENCY-takes low dose  . Penicillins Rash  . Sulfa Antibiotics Other (See Comments)    Unknown allergic reaction    Past Medical History:  Diagnosis Date  . Abnormal liver  function tests   . Acquired autoimmune hypothyroidism   . Basal cell carcinoma    Chest  . Cancer (HCC)    Breast  . Combined hyperlipidemia   . COPD with acute exacerbation ([place])   . Depression   . Diabetes mellitus   . Diabetes type 2, uncontrolled ([place])   . 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 Fracture    Left wrist  .  Goiter   . Gout   . History of gastroesophageal reflux (GERD)   . Hx of craniotomy 08/13/2017  . Hypertension   . Hypoglycemia associated with diabetes ([place])   . Meningioma ([place])   . Obesity   . Renal insufficiency   . Seizure ([place])   . Shoulder fracture, right 07/2015  . Sleep apnea, obstructive   . Thyroiditis, autoimmune   . Type II diabetes mellitus with peripheral angiopathy ([place])   . Vertigo     Social History   Socioeconomic History  . Marital status: Married    Spouse name: Konrad Dolores  . Number of children: 2  . Years of education: 71  . Highest education level: Not on file  Occupational History  . Occupation: Retired  Scientific laboratory technician  . Financial resource strain: Not on file  . Food insecurity:    Worry: Not on file    Inability: Not on file  . Transportation needs:    Medical: Not on file    Non-medical: Not on file  Tobacco Use  . Smoking status: Never Smoker  . Smokeless tobacco: Never Used  Substance and Sexual Activity  . Alcohol use: No  . Drug use: No  . Sexual activity: Never  Lifestyle  . Physical activity:    Days per week: Not on file    Minutes per session: Not on file  . Stress: Not on file  Relationships  . Social connections:    Talks on phone: Not on file    Gets together: Not on file    Attends religious service: Not on file    Active member of club or organization: Not on file    Attends meetings of clubs or organizations: Not on file    Relationship status: Not on file  . Intimate partner violence:    Fear of current or ex partner: Not on file    Emotionally abused: Not on file    Physically  abused: Not on file    Forced sexual activity: Not on file  Other Topics Concern  . Not on file  Social History Narrative   02/13/17 living at home with spouse   Right-handed.   No caffeine use.     Family History  Problem Relation Age of Onset  . Melanoma Mother   . Diabetes Father   . CAD Father   . Diabetes Sister   . Diabetes Brother      Review of Systems: General: negative for chills, fever, night sweats or weight changes.  Cardiovascular: negative for chest pain, dyspnea on exertion, edema, orthopnea, palpitations, paroxysmal nocturnal dyspnea or shortness of breath Dermatological: negative for rash Respiratory: negative for cough or wheezing Urologic: negative for hematuria Abdominal: negative for nausea, vomiting, diarrhea, bright red blood per rectum, melena, or hematemesis Neurologic: negative for visual changes, syncope, or dizziness All other systems reviewed and are otherwise negative except as noted above.    There were no vitals taken for this visit.  General appearance: alert, cooperative, no distress, morbidly obese and in wheel chair Lungs: decreased at bases Heart: regular rate and rhythm Extremities: 1+ edema, complression stockings in place Skin: pale, dry Neurologic: Grossly normal   ASSESSMENT AND PLAN:   1.Acute on chronic diastolic hearty failure: Admission weight was 242 lbs- discharge weight 228 lbs on 12/23.  Today's weight is 239 lbs though the patient's daughter says this is down 4 lbs since her lasix was increased to 60 mg BID. Echo showed an EF of 55%  with mild LVH and grade 2 DD  2. CKD stage IV: Followed in [place]  3.History of atrial flutter: Currently maintaining sinus rhythm on amiodarone and carvedilol  No anticoagulation secondary to a history of meningioma, fall risk  NSR today with rate controlin the 60's  4. History of COPD:  No wheezing on exam. Suspect she has restrictive lung disease secondary to morbid  obesity. I suggested they re schedule her sleep study  5. Hypertension: Systolic blood pressure 677/03 by me on increased Hydralazine   6.  Morbid obesity  BMI 41  PLAN:  Since she is loosing some fluid since her Lasix was increased I did not change her Rx. I considered adding Zaroxolyn for a few days but the pt's daughter tells me her nephrologist was considering changing her to Torsemide.  From a cardiac standpoint not much more to offer.  F/U with Dr Stanford Breed.    Kerin Ransom PA-C 04/19/2018 4:10 PM

## 2018-04-19 NOTE — Patient Instructions (Signed)
Medication Instructions:  Your physician recommends that you continue on your current medications as directed. Please refer to the Current Medication list given to you today.  If you need a refill on your cardiac medications before your next appointment, please call your pharmacy.   Lab work: None ordered If you have labs (blood work) drawn today and your tests are completely normal, you will receive your results only by: Marland Kitchen MyChart Message (if you have MyChart) OR . A paper copy in the mail If you have any lab test that is abnormal or we need to change your treatment, we will call you to review the results.  Testing/Procedures: None ordered  Follow-Up: At [facility], you and your health needs are our priority.  As part of our continuing mission to provide you with exceptional heart care, we have created designated Provider Care Teams.  These Care Teams include your primary Cardiologist (physician) and Advanced Practice Providers (APPs -  Physician Assistants and Nurse Practitioners) who all work together to provide you with the care you need, when you need it. . You will need a follow up appointment in April 2020 with Dr.Crenshaw   Any Other Special Instructions Will Be Listed Below (If Applicable). If there is no improvement in weight loss over the next week please contact your Nephrologist for further instruction.

## 2018-05-20 ENCOUNTER — Telehealth: Payer: Self-pay | Admitting: Neurology

## 2018-05-20 NOTE — Telephone Encounter (Signed)
Spoke to the patient's daughter, Lynelle Smoke (on [place]).  States her mother is still trying to get use to being in a nursing home.  Additionally, the patient's blood sugar has been really elevated and they are working on getting it under better control.  She would like to cancel the appt on 05/22/2018 and will call back to reschedule at a later time.

## 2018-05-20 NOTE — Telephone Encounter (Signed)
Pt's daughter Lynelle Smoke is unsure whether the patient should keep the appt on 2/12 with Hoyle Sauer. She said her father died in 2023-01-01, the pt went into nursing facility with CHF 04/12/18. The patient was to have MRI of brain and seen by neurosurgeon but that has not happened yet. She is not sure if the patient should wait until after she's had these. Please call to advise.

## 2018-05-22 ENCOUNTER — Ambulatory Visit: Payer: Medicare Other | Admitting: Nurse Practitioner

## 2018-06-06 ENCOUNTER — Ambulatory Visit
Admission: RE | Admit: 2018-06-06 | Discharge: 2018-06-06 | Disposition: A | Discharge status: Home / Self Care | Payer: Medicare Other | Source: Ambulatory Visit | Attending: Neurosurgery | Admitting: Neurosurgery

## 2018-06-06 DIAGNOSIS — D329 Benign neoplasm of meninges, unspecified: Secondary | ICD-10-CM

## 2018-06-06 MED ORDER — GADOBENATE DIMEGLUMINE 529 MG/ML IV SOLN
10.0000 mL | Freq: Once | INTRAVENOUS | Status: AC | PRN
  Administered 2018-06-06: 10 mL via INTRAVENOUS

## 2018-07-23 ENCOUNTER — Ambulatory Visit: Payer: Medicare Other | Admitting: Cardiology

## 2018-10-18 NOTE — Progress Notes (Signed)
HPI: FU atrial flutter. Previously cared for by Dr. Doylene Canard. Admitted with atrial flutter in October 2016. Nuclear study showed ejection fraction 66% and no ischemia or infarction. Patient treated with metoprolol, Cardizem and amiodarone and converted. At previous office visit, apixaban DCed due to previous meningioma resection and frequent falls. I have had discussions with patient and family previously concerning atrial flutter ablation. However she is chronically ill and they preferred conservative measures including continuing amiodarone. Admitted February 2018 with progressive weakness and falls. Fractured ankle. Last echocardiogram November 2019 showed normal LV function, mild left ventricular hypertrophy, mild left atrial enlargement and mild to moderate pulmonary hypertension.  Patient was admitted in December 2019 with CHF.  Also with chronic stage IV kidney disease.  Since last seen,  Current Outpatient Medications  Medication Sig Dispense Refill  . acetaminophen (TYLENOL) 325 MG tablet Take 650 mg by mouth every 6 (six) hours as needed for mild pain.     Marland Kitchen albuterol (PROVENTIL) (2.5 MG/3ML) 0.083% nebulizer solution Take 3 mLs (2.5 mg total) by nebulization every 2 (two) hours as needed for shortness of breath. 75 mL 12  . ALPRAZolam (XANAX) 0.5 MG tablet Take 0.5 mg by mouth every 8 (eight) hours as needed for anxiety.    Marland Kitchen amiodarone (PACERONE) 200 MG tablet Take 0.5 tablets (100 mg total) by mouth daily. TAKE 1/2 TABLET (200 MG TOTAL) BY MOUTH DAILY.    Marland Kitchen amLODipine (NORVASC) 10 MG tablet Take 1 tablet (10 mg total) by mouth daily.    . ARIPiprazole (ABILIFY) 5 MG tablet Take 5 mg by mouth every evening.    Marland Kitchen aspirin EC 81 MG tablet Take 1 tablet (81 mg total) by mouth daily. 90 tablet 3  . budesonide-formoterol (SYMBICORT) 80-4.5 MCG/ACT inhaler Inhale 2 puffs into the lungs 2 (two) times daily.    . Calcium Carb-Cholecalciferol (CALCIUM 600+D) 600-800 MG-UNIT TABS Take 1  tablet by mouth daily.    . cloNIDine (CATAPRES - DOSED IN MG/24 HR) 0.3 mg/24hr patch Place 0.3 mg onto the skin every Friday.     . colchicine 0.6 MG tablet Take 0.6 mg by mouth daily as needed (for gout).     Marland Kitchen docusate sodium (COLACE) 100 MG capsule Take 1 capsule (100 mg total) by mouth 2 (two) times daily. 10 capsule 0  . DULoxetine (CYMBALTA) 60 MG capsule Take 60 mg by mouth 2 (two) times daily.     . furosemide (LASIX) 20 MG tablet Take 60 mg by mouth 2 (two) times daily.    Marland Kitchen guaiFENesin (ROBITUSSIN) 100 MG/5ML SOLN Take 15 mLs by mouth every 4 (four) hours as needed for cough or to loosen phlegm.    Marland Kitchen HUMULIN R 500 UNIT/ML injection Inject 5-10 Units into the skin 3 (three) times daily with meals. Sliding scale:   NOTE:  Doses below are where U-100 syringe is drawn to; actually provides 5 times more insulin since using U-500 150 and below no Insulin  Breakfast  151-200 = 9 units 201-250= 10 units 251-300= 11 units 301-350=12 units 351-400= 13 units 4001-455= 14 units Above 450 = 15 units  Lunch  151-200= 8 units 201-250= 9 units 251-300= 10 units 301-350= 11 units 351-400= 12 units 401-450= 13 units Above 450 = 14 units  Supper 151-200= 4 units 201-250= 5 units 251-300= 6 units 301-350=7 units 351-400= 8 units 401-450= 9 units Above 450 = 10 units  NO INSULIN AFTER SUPPER, EVEN IF IT'S HIGH BECAUSE HER LEVELS  WILL DROP DURING THE NIGHT. PLEASE GIVE A SNACK  1  . hydrALAZINE (APRESOLINE) 100 MG tablet Take 100 mg by mouth 3 (three) times daily.    Marland Kitchen ipratropium-albuterol (DUONEB) 0.5-2.5 (3) MG/3ML SOLN Take 3 mLs by nebulization every 6 (six) hours as needed. 360 mL 1  . labetalol (NORMODYNE) 200 MG tablet Take 1 tablet (200 mg total) by mouth 2 (two) times daily.    Marland Kitchen lamoTRIgine (LAMICTAL) 100 MG tablet Take 100 mg by mouth 2 (two) times daily.    Marland Kitchen levothyroxine (SYNTHROID, LEVOTHROID) 200 MCG tablet Take 1 tablet (200 mcg total) by mouth daily before  breakfast. (Patient taking differently: Take 200 mcg by mouth See admin instructions. Take 2 tablets on Sunday then take 1 tablet on all other days) 90 tablet 3  . liraglutide (VICTOZA) 18 MG/3ML SOPN Inject 1.8 mg into the skin daily. Must take at with First dose if Inulin Must eat after    . lubiprostone (AMITIZA) 8 MCG capsule Take 8 mcg by mouth 2 (two) times daily with a meal.    . Multiple Vitamin (MULTIVITAMIN WITH MINERALS) TABS tablet Take 1 tablet by mouth daily.    . nitroGLYCERIN (NITROSTAT) 0.4 MG SL tablet Place 0.4 mg under the tongue every 5 (five) minutes as needed for chest pain.    Marland Kitchen nystatin (MYCOSTATIN/NYSTOP) powder Apply 1 g topically daily as needed (irritation).    . nystatin ointment (MYCOSTATIN) Apply 1 application topically 2 (two) times daily.    . pantoprazole (PROTONIX) 40 MG tablet Take 40 mg by mouth 2 (two) times daily.     Marland Kitchen senna (SENOKOT) 8.6 MG TABS tablet Take 2 tablets by mouth at bedtime.     . vitamin B-12 (CYANOCOBALAMIN) 1000 MCG tablet Take 1,000 mcg by mouth daily.     No current facility-administered medications for this visit.      Past Medical History:  Diagnosis Date  . Abnormal liver function tests   . Acquired autoimmune hypothyroidism   . Basal cell carcinoma    Chest  . Cancer (HCC)    Breast  . Combined hyperlipidemia   . COPD with acute exacerbation ([place])   . Depression   . Diabetes mellitus   . Diabetes type 2, uncontrolled ([place])   . 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 Fracture    Left wrist  . Goiter   . Gout   . History of gastroesophageal reflux (GERD)   . Hx of craniotomy 08/13/2017  . Hypertension   . Hypoglycemia associated with diabetes ([place])   . Meningioma ([place])   . Obesity   . Renal insufficiency   . Seizure ([place])   . Shoulder fracture, right 07/2015  . Sleep apnea, obstructive   . Thyroiditis, autoimmune   . Type II diabetes mellitus with peripheral angiopathy ([place])   . Vertigo     Past  Surgical History:  Procedure Laterality Date  . APPENDECTOMY    . BACK SURGERY    . BASAL CELL CARCINOMA EXCISION     Chest  . CHOLECYSTECTOMY    . CRANIOTOMY Right 05/08/2014   Procedure: CRANIOTOMY FOR MENINGIOMA;  Surgeon: Ashok Pall, MD;  Location: [facility];  Service: Neurosurgery;  Laterality: Right;  Right Craniotomy for meningioma  . ESOPHAGOGASTRODUODENOSCOPY (EGD) WITH PROPOFOL N/A 05/22/2014   Procedure: ESOPHAGOGASTRODUODENOSCOPY (EGD) WITH PROPOFOL;  Surgeon: Wonda Horner, MD;  Location[facility] END[facility]COPY;  Service: Endoscopy;  Laterality: N/A;  . LAPAROSCOPIC GASTRIC BANDING    .  MASTECTOMY Left     Social History   Socioeconomic History  . Marital status: Married    Spouse name: Konrad Dolores  . Number of children: 2  . Years of education: 8  . Highest education level: Not on file  Occupational History  . Occupation: Retired  Scientific laboratory technician  . Financial resource strain: Not on file  . Food insecurity    Worry: Not on file    Inability: Not on file  . Transportation needs    Medical: Not on file    Non-medical: Not on file  Tobacco Use  . Smoking status: Never Smoker  . Smokeless tobacco: Never Used  Substance and Sexual Activity  . Alcohol use: No  . Drug use: No  . Sexual activity: Never  Lifestyle  . Physical activity    Days per week: Not on file    Minutes per session: Not on file  . Stress: Not on file  Relationships  . Social Herbalist on phone: Not on file    Gets together: Not on file    Attends religious service: Not on file    Active member of club or organization: Not on file    Attends meetings of clubs or organizations: Not on file    Relationship status: Not on file  . Intimate partner violence    Fear of current or ex partner: Not on file    Emotionally abused: Not on file    Physically abused: Not on file    Forced sexual activity: Not on file  Other Topics Concern  . Not on file  Social History Narrative   02/13/17 living at  home with spouse   Right-handed.   No caffeine use.    Family History  Problem Relation Age of Onset  . Melanoma Mother   . Diabetes Father   . CAD Father   . Diabetes Sister   . Diabetes Brother     ROS: no fevers or chills, productive cough, hemoptysis, dysphasia, odynophagia, melena, hematochezia, dysuria, hematuria, rash, seizure activity, orthopnea, PND, pedal edema, claudication. Remaining systems are negative.  Physical Exam: Well-developed well-nourished in no acute distress.  Skin is warm and dry.  HEENT is normal.  Neck is supple.  Chest is clear to auscultation with normal expansion.  Cardiovascular exam is regular rate and rhythm.  Abdominal exam nontender or distended. No masses palpated. Extremities show no edema. neuro grossly intact   A/P  1 atrial flutter-patient remains in sinus rhythm.  Continue amiodarone at present dose.  Given history of frequent falls we have elected not to anticoagulate as we feel risk outweighs benefit.  She has not wanted to consider ablation given multiple comorbidities.  Continue aspirin 81 mg daily.  We will check TSH, liver functions and chest x-ray.  2 hypertension-patient's blood pressure is elevated; however we recently discontinued carvedilol and added labetalol and her blood pressure is improving.  We will continue to follow and advance regimen as needed.  3 hyperlipidemia-followed by primary care.  4 chronic diastolic congestive heart failure-patient will continue with present dose of diuretic.  Continue fluid restriction and low-sodium diet.  She has been monitored by a physician at the nursing home.  Potassium and renal function also monitored by nephrology.  5 chronic stage IV kidney disease-followed by nephrology.  Kirk Ruths, MD

## 2018-10-23 ENCOUNTER — Telehealth: Payer: Self-pay | Admitting: Cardiology

## 2018-10-23 MED ORDER — LABETALOL HCL 200 MG PO TABS: 200.0000 mg | ORAL_TABLET | Freq: Two times a day (BID) | ORAL | Status: DC

## 2018-10-23 NOTE — Telephone Encounter (Signed)
Left message for heather to call

## 2018-10-23 NOTE — Telephone Encounter (Signed)
Spoke with heather, verbal order for medication change given. Also visit on 10-29-2018 changed to virtual.

## 2018-10-23 NOTE — Telephone Encounter (Signed)
error 

## 2018-10-23 NOTE — Telephone Encounter (Signed)
[facility] She states that pt's BP has been consistently running higher.  Medications as directed. today 158/67 HR 76 Yesterday am 159/73 HR 71 pm 147/69 HR 71 Do you want to change/increase his medication? Please advise

## 2018-10-23 NOTE — Telephone Encounter (Signed)
DC coreg and treat with labetalol 200 mg BID; follow BP Morgan [name]

## 2018-10-23 NOTE — Telephone Encounter (Signed)
New message:    Nira Conn from [facility] calling to see if this patient can do a virtual visit. Please call back.

## 2018-10-29 ENCOUNTER — Telehealth (INDEPENDENT_AMBULATORY_CARE_PROVIDER_SITE_OTHER): Payer: Medicare Other | Admitting: Cardiology

## 2018-10-29 VITALS — BP 156/69 | HR 60 | Temp 96.9°F | Resp 18 | Wt 232.4 lb

## 2018-10-29 DIAGNOSIS — I4892 Unspecified atrial flutter: Secondary | ICD-10-CM | POA: Diagnosis not present

## 2018-10-29 DIAGNOSIS — R7989 Other specified abnormal findings of blood chemistry: Secondary | ICD-10-CM | POA: Diagnosis not present

## 2018-10-29 DIAGNOSIS — I483 Typical atrial flutter: Secondary | ICD-10-CM

## 2018-10-29 DIAGNOSIS — E039 Hypothyroidism, unspecified: Secondary | ICD-10-CM

## 2018-10-29 NOTE — Patient Instructions (Addendum)
Medication Instructions:  NO CHANGE If you need a refill on your cardiac medications before your next appointment, please call your pharmacy.   Lab work: Your physician recommends that you HAVE LAB WORK AT [place] If you have labs (blood work) drawn today and your tests are completely normal, you will receive your results only by: Marland Kitchen MyChart Message (if you have MyChart) OR . A paper copy in the mail If you have any lab test that is abnormal or we need to change your treatment, we will call you to review the results.  Testing: A chest x-ray takes a picture of the organs and structures inside the chest, including the heart, lungs, and blood vessels. This test can show several things, including, whether the heart is enlarges; whether fluid is building up in the lungs; and whether pacemaker / defibrillator leads are still in place.   Follow-Up: At [facility], you and your health needs are our priority.  As part of our continuing mission to provide you with exceptional heart care, we have created designated Provider Care Teams.  These Care Teams include your primary Cardiologist (physician) and Advanced Practice Providers (APPs -  Physician Assistants and Nurse Practitioners) who all work together to provide you with the care you need, when you need it. You will need a follow up appointment in 6 months.  Please call our office 2 months in advance to schedule this appointment.  You may see Kirk Ruths MD or one of the following Advanced Practice Providers on your designated Care Team:   Kerin Ransom, PA-C Roby Lofts, [place] . Sande Rives, PA-C

## 2019-01-09 ENCOUNTER — Encounter: Payer: Self-pay | Admitting: *Deleted

## 2019-05-19 ENCOUNTER — Telehealth: Payer: Self-pay | Admitting: Cardiology

## 2019-05-19 DIAGNOSIS — I483 Typical atrial flutter: Secondary | ICD-10-CM

## 2019-05-19 NOTE — Telephone Encounter (Signed)
Hilda Blades- are you needing another TSH and Liver for Amio use?  Didn't know if Dr. Stanford Breed needed anything else.

## 2019-05-19 NOTE — Telephone Encounter (Signed)
New Message    Pts daughter is calling and says [facility] has not received the request for [place] home [phone] [phone]    Please advise

## 2019-05-19 NOTE — Telephone Encounter (Signed)
Spoke with pt daughter, lab and cxr order faxed to [facility] at [phone].

## 2019-05-21 NOTE — Progress Notes (Signed)
Virtual Visit via Video Note   This visit type was conducted due to national recommendations for restrictions regarding the COVID-19 Pandemic (e.g. social distancing) in an effort to limit this patient's exposure and mitigate transmission in our community.  Due to her co-morbid illnesses, this patient is at least at moderate risk for complications without adequate follow up.  This format is felt to be most appropriate for this patient at this time.  All issues noted in this document were discussed and addressed.  A limited physical exam was performed with this format.  Please refer to the patient's chart for her consent to telehealth for [facility].   Date:  05/22/2019   ID:  Morgan [name], DOB 09/08/1940, MRN [id number]  Patient Location:Home Provider Location: Home  PCP:  [name]., MD  Cardiologist:  Dr Stanford Breed  Evaluation Performed:  Follow-Up Visit  Chief Complaint:  FU atrial flutter  History of Present Illness:    FU atrial flutter. Previously cared for by Dr. Doylene Canard. Admitted with atrial flutter in October 2016. Nuclear study showed ejection fraction 66% and no ischemia or infarction. Patient treated with metoprolol, Cardizem and amiodarone and converted. At previous office visit, apixaban DCed due to previous meningioma resection and frequent falls. I have had discussions with patient and family previously concerning atrial flutter ablation. However she is chronically ill and they preferred conservative measures including continuing amiodarone. Admitted February 2018 with progressive weakness and falls. Fractured ankle. Last echocardiogram November 2019 showed normal LV function, mild left ventricular hypertrophy, mild left atrial enlargement and mild to moderate pulmonary hypertension.  Also with chronic stage IV kidney disease.  Since last seen,patient describes increased dyspnea and bilateral lower extremity edema.  Her furosemide was changed to torsemide by  nephrology in January and the dose was recently increased.  She denies chest pain or syncope.  She again fell 2 days ago injuring her knees.  The patient does not have symptoms concerning for COVID-19 infection (fever, chills, cough, or new shortness of breath).    Past Medical History:  Diagnosis Date  . Abnormal liver function tests   . Acquired autoimmune hypothyroidism   . Basal cell carcinoma    Chest  . Cancer (HCC)    Breast  . Combined hyperlipidemia   . COPD with acute exacerbation ([place])   . Depression   . Diabetes mellitus   . Diabetes type 2, uncontrolled ([place])   . 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 Fracture    Left wrist  . Goiter   . Gout   . History of gastroesophageal reflux (GERD)   . Hx of craniotomy 08/13/2017  . Hypertension   . Hypoglycemia associated with diabetes ([place])   . Meningioma ([place])   . Obesity   . Renal insufficiency   . Seizure ([place])   . Shoulder fracture, right 07/2015  . Sleep apnea, obstructive   . Thyroiditis, autoimmune   . Type II diabetes mellitus with peripheral angiopathy ([place])   . Vertigo    Past Surgical History:  Procedure Laterality Date  . APPENDECTOMY    . BACK SURGERY    . BASAL CELL CARCINOMA EXCISION     Chest  . CHOLECYSTECTOMY    . CRANIOTOMY Right 05/08/2014   Procedure: CRANIOTOMY FOR MENINGIOMA;  Surgeon: Ashok Pall, MD;  Location: [facility]UR[facility]S;  Service: Neurosurgery;  Laterality: Right;  Right Craniotomy for meningioma  . ESOPHAGOGASTRODUODENOSCOPY (EGD) WITH PROPOFOL N/A 05/22/2014   Procedure: ESOPHAGOGASTRODUODENOSCOPY (EGD)  WITH PROPOFOL;  Surgeon: Wonda Horner, MD;  Location[facility] ENDOSCOPY;  Service: Endoscopy;  Laterality: N/A;  . LAPAROSCOPIC GASTRIC BANDING    . MASTECTOMY Left      Current Meds  Medication Sig  . acetaminophen (TYLENOL) 325 MG tablet Take 650 mg by mouth every 6 (six) hours as needed for mild pain.   Marland Kitchen albuterol (PROVENTIL) (2.5 MG/3ML) 0.083% nebulizer solution  Take 3 mLs (2.5 mg total) by nebulization every 2 (two) hours as needed for shortness of breath.  Marland Kitchen amiodarone (PACERONE) 200 MG tablet Take 0.5 tablets (100 mg total) by mouth daily. TAKE 1/2 TABLET (200 MG TOTAL) BY MOUTH DAILY.  Marland Kitchen amLODipine (NORVASC) 10 MG tablet Take 1 tablet (10 mg total) by mouth daily.  . ARIPiprazole (ABILIFY) 5 MG tablet Take 5 mg by mouth every evening.  Marland Kitchen aspirin EC 81 MG tablet Take 1 tablet (81 mg total) by mouth daily.  . budesonide-formoterol (SYMBICORT) 80-4.5 MCG/ACT inhaler Inhale 2 puffs into the lungs 2 (two) times daily.  . Calcium Carb-Cholecalciferol (CALCIUM 600+D) 600-800 MG-UNIT TABS Take 1 tablet by mouth daily.  . citalopram (CELEXA) 40 MG tablet Take 40 mg by mouth daily.  . cloNIDine (CATAPRES - DOSED IN MG/24 HR) 0.3 mg/24hr patch Place 0.3 mg onto the skin every Friday.   Marland Kitchen DOXYCYCLINE PO Take 1 tablet by mouth daily.  . DULoxetine (CYMBALTA) 60 MG capsule Take 60 mg by mouth 2 (two) times daily.   . furosemide (LASIX) 20 MG tablet Take 60 mg by mouth 2 (two) times daily.  Marland Kitchen guaiFENesin (ROBITUSSIN) 100 MG/5ML SOLN Take 15 mLs by mouth every 4 (four) hours as needed for cough or to loosen phlegm.  Marland Kitchen HUMULIN R 500 UNIT/ML injection Inject 5-10 Units into the skin 3 (three) times daily with meals. [name]:   NOTE:  Doses below are where U-100 syringe is drawn to; actually provides 5 times more insulin since using U-500 150 and below no Insulin  Breakfast  151-200 = 9 units 201-250= 10 units 251-300= 11 units 301-350=12 units 351-400= 13 units 4001-455= 14 units Above 450 = 15 units  Lunch  151-200= 8 units 201-250= 9 units 251-300= 10 units 301-350= 11 units 351-400= 12 units 401-450= 13 units Above 450 = 14 units  Supper 151-200= 4 units 201-250= 5 units 251-300= 6 units 301-350=7 units 351-400= 8 units 401-450= 9 units Above 450 = 10 units  NO INSULIN AFTER SUPPER, EVEN IF IT'S HIGH BECAUSE HER LEVELS WILL DROP DURING  THE NIGHT. PLEASE GIVE A SNACK  . hydrALAZINE (APRESOLINE) 100 MG tablet Take 100 mg by mouth 3 (three) times daily.  Marland Kitchen ipratropium-albuterol (DUONEB) 0.5-2.5 (3) MG/3ML SOLN Take 3 mLs by nebulization every 6 (six) hours as needed.  . labetalol (NORMODYNE) 200 MG tablet Take 1 tablet (200 mg total) by mouth 2 (two) times daily.  Marland Kitchen lamoTRIgine (LAMICTAL) 100 MG tablet Take 100 mg by mouth 2 (two) times daily.  Marland Kitchen levothyroxine (SYNTHROID, LEVOTHROID) 200 MCG tablet Take 1 tablet (200 mcg total) by mouth daily before breakfast. (Patient taking differently: Take 200 mcg by mouth daily. )  . liraglutide (VICTOZA) 18 MG/3ML SOPN Inject 1.8 mg into the skin daily. Must take at with First dose if Inulin Must eat after  . loperamide (IMODIUM A-D) 2 MG tablet Take 4 mg by mouth 4 (four) times daily as needed for diarrhea or loose stools.  . lubiprostone (AMITIZA) 8 MCG capsule Take 8 mcg by mouth 2 (  two) times daily with a meal.  . meclizine (ANTIVERT) 25 MG tablet Take 25 mg by mouth 3 (three) times daily as needed for dizziness.  . Multiple Vitamin (MULTIVITAMIN WITH MINERALS) TABS tablet Take 1 tablet by mouth daily.  . nitroGLYCERIN (NITROSTAT) 0.4 MG SL tablet Place 0.4 mg under the tongue every 5 (five) minutes as needed for chest pain.  Marland Kitchen ondansetron (ZOFRAN) 8 MG tablet Take 8 mg by mouth in the morning, at noon, in the evening, and at bedtime.  . pantoprazole (PROTONIX) 40 MG tablet Take 40 mg by mouth 2 (two) times daily.   . Polyethylene Glycol 3350 (MIRALAX PO) Take by mouth.  . senna (SENOKOT) 8.6 MG TABS tablet Take 2 tablets by mouth at bedtime.   . TORSEMIDE PO Take 80 mg by mouth 2 (two) times daily.  . Vitamin D, Ergocalciferol, (DRISDOL) 1.25 MG (50000 UNIT) CAPS capsule Take 50,000 Units by mouth every 7 (seven) days.  . [DISCONTINUED] ALPRAZolam (XANAX) 0.5 MG tablet Take 0.5 mg by mouth every 8 (eight) hours as needed for anxiety.  . [DISCONTINUED] colchicine 0.6 MG tablet Take 0.6  mg by mouth daily as needed (for gout).   . [DISCONTINUED] docusate sodium (COLACE) 100 MG capsule Take 1 capsule (100 mg total) by mouth 2 (two) times daily.  . [DISCONTINUED] nystatin (MYCOSTATIN/NYSTOP) powder Apply 1 g topically daily as needed (irritation).  . [DISCONTINUED] nystatin ointment (MYCOSTATIN) Apply 1 application topically 2 (two) times daily.  . [DISCONTINUED] vitamin B-12 (CYANOCOBALAMIN) 1000 MCG tablet Take 1,000 mcg by mouth daily.     Allergies:   Latex, Wellbutrin [bupropion], Keppra [levetiracetam], Statins, Byetta 10 mcg pen [exenatide], Metformin, Penicillins, and Sulfa antibiotics   Social History   Tobacco Use  . Smoking status: Never Smoker  . Smokeless tobacco: Never Used  Substance Use Topics  . Alcohol use: No  . Drug use: No     Family Hx: The patient's family history includes CAD in her father; Diabetes in her brother, father, and sister; Melanoma in her mother.  ROS:   Please see the history of present illness.    No Fever, chills  or productive cough; complains of bilateral knee pain and generalized weakness. All other systems reviewed and are negative.   Recent Lipid Panel Lab Results  Component Value Date/Time   CHOL  05/10/2009 04:37 AM    132        ATP III CLASSIFICATION:  <200     mg/dL   Desirable  200-239  mg/dL   Borderline High  >=240    mg/dL   High          TRIG 288 (H) 05/25/2014 04:30 AM    Wt Readings from Last 3 Encounters:  05/22/19 225 lb (102.1 kg)  10/29/18 232 lb 6.4 oz (105.4 kg)  04/19/18 239 lb 12.8 oz (108.8 kg)     Objective:    Vital Signs:  BP (!) 129/57   Pulse 86   Ht 5' 4"  (1.626 m)   Wt 225 lb (102.1 kg)   BMI 38.62 kg/m    VITAL SIGNS:  reviewed NAD Answers questions appropriately Normal affect Remainder of physical examination not performed (telehealth visit; coronavirus pandemic)  ASSESSMENT & PLAN:    1. Atrial flutter-Continue amiodarone.  Check liver functions.  We will obtain  recent results of chest x-ray and TSH.  By report chest x-ray showed possible pneumonia and she was placed on doxycycline by a physicians assistant there.  Continue  aspirin.  Patient has had problems with frequent falls in the past and we have therefore elected not to anticoagulate. 2. Hypertension-blood pressure controlled.  Continue present medical regimen. 3. Chronic diastolic congestive heart failure-based on history patient is volume overloaded.  Her Demadex was increased by nephrology recently.  We will continue with present dose.  I will have her seen in the office next week to make sure that her volume status is stable.  She is likely volume overloaded from a combination of both diastolic congestive heart failure and renal insufficiency. 4. Chronic stage IV kidney disease-followed by nephrology. 5. Hyperlipidemia-Per primary care.  COVID-19 Education: The importance of social distancing was discussed today.  Time:   Today, I have spent 16 minutes with the patient with telehealth technology discussing the above problems.     Medication Adjustments/Labs and Tests Ordered: Current medicines are reviewed at length with the patient today.  Concerns regarding medicines are outlined above.   Tests Ordered: No orders of the defined types were placed in this encounter.   Medication Changes: No orders of the defined types were placed in this encounter.   Follow Up:  Either In Person or Virtual in 6 month(s)  Signed, Kirk Ruths, MD  05/22/2019 10:31 AM    [place]

## 2019-05-22 ENCOUNTER — Telehealth (INDEPENDENT_AMBULATORY_CARE_PROVIDER_SITE_OTHER): Payer: Medicare Other | Admitting: Cardiology

## 2019-05-22 VITALS — BP 129/57 | HR 86 | Ht 64.0 in | Wt 225.0 lb

## 2019-05-22 DIAGNOSIS — N184 Chronic kidney disease, stage 4 (severe): Secondary | ICD-10-CM

## 2019-05-22 DIAGNOSIS — I5033 Acute on chronic diastolic (congestive) heart failure: Secondary | ICD-10-CM

## 2019-05-22 DIAGNOSIS — I483 Typical atrial flutter: Secondary | ICD-10-CM | POA: Diagnosis not present

## 2019-05-22 DIAGNOSIS — I130 Hypertensive heart and chronic kidney disease with heart failure and stage 1 through stage 4 chronic kidney disease, or unspecified chronic kidney disease: Secondary | ICD-10-CM

## 2019-05-22 DIAGNOSIS — I10 Essential (primary) hypertension: Secondary | ICD-10-CM

## 2019-05-22 DIAGNOSIS — E785 Hyperlipidemia, unspecified: Secondary | ICD-10-CM

## 2019-05-22 NOTE — Patient Instructions (Signed)
Medication Instructions:  NO CHANGE *If you need a refill on your cardiac medications before your next appointment, please call your pharmacy*  Lab Work: If you have labs (blood work) drawn today and your tests are completely normal, you will receive your results only by: Marland Kitchen MyChart Message (if you have MyChart) OR . A paper copy in the mail If you have any lab test that is abnormal or we need to change your treatment, we will call you to review the results.  Follow-Up: At [facility], you and your health needs are our priority.  As part of our continuing mission to provide you with exceptional heart care, we have created designated Provider Care Teams.  These Care Teams include your primary Cardiologist (physician) and Advanced Practice Providers (APPs -  Physician Assistants and Nurse Practitioners) who all work together to provide you with the care you need, when you need it.  Your next appointment:   1 week(s)  The format for your next appointment:   In Person  Provider:   PA OR NP

## 2019-05-25 NOTE — Progress Notes (Signed)
Cardiology Office Note   Date:  05/26/2019   ID:  APRYL [name], DOB 12-15-1940, MRN [id number]  PCP:  System, Pcp Not In  Cardiologist: D.  Crenshaw CC: Follow Up CHF   History of Present Illness: Morgan [name] is a 79 y.o. female who presents for ongoing assessment and management of atrial flutter, treated with metoprolol diltiazem and amiodarone and converted to normal sinus rhythm.  She was temporarily on apixaban but this was discontinued due to previous meningioma resection and frequent falls.  Due to chronic illnesses she was not in favor of pursuing atrial flutter ablation, and wished to be treated medically only.  She fell again in 2018 fracturing her ankle. Other history includes chronic kidney disease stage IV.  She was last seen in the office by Dr. Stanford Breed on 05/22/2019 with complaints of increased dyspnea and bilateral lower extremity edema.  Her furosemide had been changed to torsemide by nephrology in January 2021 with the dose recently increased.  She also fell 2 days earlier injuring her knees.  She had a recent chest x-ray which revealed possible pneumonia and she was placed on doxycycline.  Dr. Stanford Breed felt that her symptoms were related to volume overload, no changes were made in her dose of Demadex as it had recently been increased by nephrology.  He felt that her volume overload was related to a combination of both diastolic CHF and renal insufficiency.  She was continued on aspirin only for anticoagulation.  She is a resident of [facility].  She is weighed once a week. She has PT once a week. She sleeps in a recliner as this is more comfortable concerning breathing and right shoulder pain.  She comes today in a wheelchair.  She complains of LEE, and leg pain   Past Medical History:  Diagnosis Date  . Abnormal liver function tests   . Acquired autoimmune hypothyroidism   . Basal cell carcinoma    Chest  . Cancer (HCC)    Breast  . Combined hyperlipidemia   .  COPD with acute exacerbation ([place])   . Depression   . Diabetes mellitus   . Diabetes type 2, uncontrolled ([place])   . DM neuropathy with neurologic complication ([place])   . Fracture    Left wrist  . Goiter   . Gout   . History of gastroesophageal reflux (GERD)   . Hx of craniotomy 08/13/2017  . Hypertension   . Hypoglycemia associated with diabetes ([place])   . Meningioma ([place])   . Obesity   . Renal insufficiency   . Seizure ([place])   . Shoulder fracture, right 07/2015  . Sleep apnea, obstructive   . Thyroiditis, autoimmune   . Type II diabetes mellitus with peripheral angiopathy ([place])   . Vertigo     Past Surgical History:  Procedure Laterality Date  . APPENDECTOMY    . BACK SURGERY    . BASAL CELL CARCINOMA EXCISION     Chest  . CHOLECYSTECTOMY    . CRANIOTOMY Right 05/08/2014   Procedure: CRANIOTOMY FOR MENINGIOMA;  Surgeon: Ashok Pall, MD;  Location: [facility]UR[facility] ORS;  Service: Neurosurgery;  Laterality: Right;  Right Craniotomy for meningioma  . ESOPHAGOGASTRODUODENOSCOPY (EGD) WITH PROPOFOL N/A 05/22/2014   Procedure: ESOPHAGOGASTRODUODENOSCOPY (EGD) WITH PROPOFOL;  Surgeon: Wonda Horner, MD;  Location: [facility] ENDOSCOPY;  Service: Endoscopy;  Laterality: N/A;  . LAPAROSCOPIC GASTRIC BANDING    . MASTECTOMY Left      Current Outpatient Medications  Medication Sig Dispense Refill  .  acetaminophen (TYLENOL) 325 MG tablet Take 650 mg by mouth every 6 (six) hours as needed for mild pain.     Marland Kitchen albuterol (PROVENTIL) (2.5 MG/3ML) 0.083% nebulizer solution Take 3 mLs (2.5 mg total) by nebulization every 2 (two) hours as needed for shortness of breath. 75 mL 12  . amiodarone (PACERONE) 200 MG tablet Take 0.5 tablets (100 mg total) by mouth daily. TAKE 1/2 TABLET (200 MG TOTAL) BY MOUTH DAILY.    Marland Kitchen amLODipine (NORVASC) 10 MG tablet Take 1 tablet (10 mg total) by mouth daily.    . ARIPiprazole (ABILIFY) 5 MG tablet Take 5 mg by mouth every evening.    Marland Kitchen aspirin EC 81 MG tablet Take 1 tablet  (81 mg total) by mouth daily. 90 tablet 3  . budesonide-formoterol (SYMBICORT) 80-4.5 MCG/ACT inhaler Inhale 2 puffs into the lungs 2 (two) times daily.    . Calcium Carb-Cholecalciferol (CALCIUM 600+D) 600-800 MG-UNIT TABS Take 1 tablet by mouth daily.    . citalopram (CELEXA) 20 MG tablet Take 20 mg by mouth daily.    . citalopram (CELEXA) 40 MG tablet Take 40 mg by mouth daily.    . cloNIDine (CATAPRES - DOSED IN MG/24 HR) 0.3 mg/24hr patch Place 0.3 mg onto the skin every Friday.     Marland Kitchen DOXYCYCLINE PO Take 1 tablet by mouth daily.    . DULoxetine (CYMBALTA) 60 MG capsule Take 60 mg by mouth 2 (two) times daily.     Marland Kitchen HUMULIN R 500 UNIT/ML injection Inject 5-10 Units into the skin 3 (three) times daily with meals. Sliding scale:   NOTE:  Doses below are where U-100 syringe is drawn to; actually provides 5 times more insulin since using U-500 150 and below no Insulin  Breakfast  151-200 = 9 units 201-250= 10 units 251-300= 11 units 301-350=12 units 351-400= 13 units 4001-455= 14 units Above 450 = 15 units  Lunch  151-200= 8 units 201-250= 9 units 251-300= 10 units 301-350= 11 units 351-400= 12 units 401-450= 13 units Above 450 = 14 units  Supper 151-200= 4 units 201-250= 5 units 251-300= 6 units 301-350=7 units 351-400= 8 units 401-450= 9 units Above 450 = 10 units  NO INSULIN AFTER SUPPER, EVEN IF IT'S HIGH BECAUSE HER LEVELS WILL DROP DURING THE NIGHT. PLEASE GIVE A SNACK  1  . ipratropium-albuterol (DUONEB) 0.5-2.5 (3) MG/3ML SOLN Take 3 mLs by nebulization every 6 (six) hours as needed. 360 mL 1  . labetalol (NORMODYNE) 200 MG tablet Take 1 tablet (200 mg total) by mouth 2 (two) times daily.    Marland Kitchen lamoTRIgine (LAMICTAL) 100 MG tablet Take 100 mg by mouth 2 (two) times daily.    Marland Kitchen levothyroxine (SYNTHROID, LEVOTHROID) 200 MCG tablet Take 1 tablet (200 mcg total) by mouth daily before breakfast. (Patient taking differently: Take 200 mcg by mouth daily. ) 90 tablet 3  .  liraglutide (VICTOZA) 18 MG/3ML SOPN Inject 1.8 mg into the skin daily. Must take at with First dose if Inulin Must eat after    . loperamide (IMODIUM A-D) 2 MG tablet Take 4 mg by mouth 4 (four) times daily as needed for diarrhea or loose stools.    . lubiprostone (AMITIZA) 8 MCG capsule Take 8 mcg by mouth 2 (two) times daily with a meal.    . meclizine (ANTIVERT) 25 MG tablet Take 25 mg by mouth 3 (three) times daily as needed for dizziness.    . Multiple Vitamin (MULTIVITAMIN WITH MINERALS) TABS tablet Take  1 tablet by mouth daily.    . nitroGLYCERIN (NITROSTAT) 0.4 MG SL tablet Place 0.4 mg under the tongue every 5 (five) minutes as needed for chest pain.    Marland Kitchen ondansetron (ZOFRAN) 8 MG tablet Take 8 mg by mouth in the morning, at noon, in the evening, and at bedtime.    . pantoprazole (PROTONIX) 40 MG tablet Take 40 mg by mouth 2 (two) times daily.     . Polyethylene Glycol 3350 (MIRALAX PO) Take by mouth.    . senna (SENOKOT) 8.6 MG TABS tablet Take 2 tablets by mouth at bedtime.     . torsemide (DEMADEX) 20 MG tablet     . TORSEMIDE PO Take 80 mg by mouth 2 (two) times daily.    . Vitamin D, Ergocalciferol, (DRISDOL) 1.25 MG (50000 UNIT) CAPS capsule Take 50,000 Units by mouth every 7 (seven) days.     No current facility-administered medications for this visit.    Allergies:   Latex, Wellbutrin [bupropion], Keppra [levetiracetam], Statins, Byetta 10 mcg pen [exenatide], Metformin, Penicillins, and Sulfa antibiotics    Social History:  The patient  reports that she has never smoked. She has never used smokeless tobacco. She reports that she does not drink alcohol or use drugs.   Family History:  The patient's family history includes CAD in her father; Diabetes in her brother, father, and sister; Melanoma in her mother.    ROS: All other systems are reviewed and negative. Unless otherwise mentioned in H&P    PHYSICAL EXAM: VS:  BP 134/68   Temp (!) 97.2 F (36.2 C)   Ht 5' 5"   (1.651 m)   Wt 231 lb 14.4 oz (105.2 kg)   SpO2 98%   BMI 38.59 kg/m  , BMI Body mass index is 38.59 kg/m. GEN: Well nourished, well developed, in no acute distress HEENT: normal Neck: no JVD, carotid bruits, or masses Cardiac: RRR; 1/6 systolic  Murmur heard best at the LSB, rubs, or gallops,no edema  Respiratory:  Mild bibasilar crackles. No wheezes or coughing.  GI: soft, nontender, nondistended, + BS MS: no deformity or atrophy. Bilateral woody edema to the thighs, with 3+ bilateral pitting edema in her feet. Skin: warm and dry, no rash. Dry scales to the pretibial area bilaterally.  Neuro:  Strength and sensation are intact Psych: euthymic mood, full affect   EKG:  Personally reviewed. SR with first degree AV Block PR .214. anteroseptal changes are noted. .   Recent Labs: No results found for requested labs within last 8760 hours.    Lipid Panel    Component Value Date/Time   CHOL  05/10/2009 0437    132        ATP III CLASSIFICATION:  <200     mg/dL   Desirable  200-239  mg/dL   Borderline High  >=240    mg/dL   High          TRIG 288 (H) 05/25/2014 0430      Wt Readings from Last 3 Encounters:  05/26/19 231 lb 14.4 oz (105.2 kg)  05/22/19 225 lb (102.1 kg)  10/29/18 232 lb 6.4 oz (105.4 kg)      Other studies Reviewed: Echocardiogram:02-25-2018 Left ventricle: The cavity size was normal. Wall thickness was  increased in a pattern of mild LVH. Systolic function was normal.  The estimated ejection fraction was in the range of 55% to 60%.  Wall motion was normal; there were no regional wall motion  abnormalities.  Features are consistent with a pseudonormal left  ventricular filling pattern, with concomitant abnormal relaxation  and increased filling pressure (grade 2 diastolic dysfunction).  - Aortic valve: There was trivial regurgitation.  - Left atrium: The atrium was mildly dilated.  - Pulmonary arteries: Systolic pressure was mildly to  moderately  increased. PA peak pressure: 47 mm Hg (S).    ASSESSMENT AND PLAN:  1. Chronic Diastolic CHF: She is on high doses of torsemide, 80 mg BID.  She has not yet seen nephrology. She has lymphedema with dependent edema. Will check BMET and BNP today.  She is to have daily weights, low sodium diet and continue fluid restriction. Weight today is not completed as she could not stand. Stated weight is 250 lbs.   2. NYHA class II-III Dyspnea:  She has hx of COPD and is on inhalers. Recommend that she use inhaler before PT. Consider referral to pulmonology.   3. CKD Stage IV-V: Discussion with nephrology of need to begin dialysis. Her daughter is concerned that [facility] will not be able to provide this for her. Defer to nephrology for recommendations  4. Lymphedema:  Consider intermittent compression devices 3-4 times a week for help with dependent edema and fluid overload in the LEE.   5. Deconditioning: She is working with PT but breathing status is severely limiting her progress. She has fallen several times.     Current medicines are reviewed at length with the patient today.  I have spent 50 minutes  dedicated to the care of this patient on the date of this encounter to include pre-visit review of records, assessment, management and diagnostic testing,with shared decision making.  Labs/ tests ordered today include: BMET, BNP, CBC.  Phill Myron. West Pugh, ANP, [facility]   05/26/2019 11:32 AM    [facility] [place] [address] Office [phone] Fax [phone]  Notice: This dictation was prepared with Dragon dictation along with smaller phrase technology. Any transcriptional errors that result from this process are unintentional and may not be corrected upon review.

## 2019-05-26 ENCOUNTER — Other Ambulatory Visit: Payer: Self-pay

## 2019-05-26 ENCOUNTER — Ambulatory Visit (INDEPENDENT_AMBULATORY_CARE_PROVIDER_SITE_OTHER): Payer: Medicare Other | Admitting: Adult Health

## 2019-05-26 ENCOUNTER — Encounter: Payer: Self-pay | Admitting: Adult Health

## 2019-05-26 VITALS — BP 134/68 | Temp 97.2°F | Ht 65.0 in | Wt 231.9 lb

## 2019-05-26 DIAGNOSIS — I5043 Acute on chronic combined systolic (congestive) and diastolic (congestive) heart failure: Secondary | ICD-10-CM

## 2019-05-26 DIAGNOSIS — E1142 Type 2 diabetes mellitus with diabetic polyneuropathy: Secondary | ICD-10-CM | POA: Diagnosis not present

## 2019-05-26 DIAGNOSIS — N184 Chronic kidney disease, stage 4 (severe): Secondary | ICD-10-CM | POA: Diagnosis not present

## 2019-05-26 DIAGNOSIS — R5381 Other malaise: Secondary | ICD-10-CM

## 2019-05-26 DIAGNOSIS — I4892 Unspecified atrial flutter: Secondary | ICD-10-CM

## 2019-05-26 DIAGNOSIS — Z79899 Other long term (current) drug therapy: Secondary | ICD-10-CM | POA: Diagnosis not present

## 2019-05-26 DIAGNOSIS — J438 Other emphysema: Secondary | ICD-10-CM

## 2019-05-26 DIAGNOSIS — I890 Lymphedema, not elsewhere classified: Secondary | ICD-10-CM

## 2019-05-26 NOTE — Patient Instructions (Signed)
Medication Instructions:  Continue current medications  *If you need a refill on your cardiac medications before your next appointment, please call your pharmacy*  Lab Work: BNP, BMP, CBC and HgB A1C  If you have labs (blood work) drawn today and your tests are completely normal, you will receive your results only by: Marland Kitchen MyChart Message (if you have MyChart) OR . A paper copy in the mail If you have any lab test that is abnormal or we need to change your treatment, we will call you to review the results.  Testing/Procedures: None Ordered  Follow-Up: At [facility], you and your health needs are our priority.  As part of our continuing mission to provide you with exceptional heart care, we have created designated Provider Care Teams.  These Care Teams include your primary Cardiologist (physician) and Advanced Practice Providers (APPs -  Physician Assistants and Nurse Practitioners) who all work together to provide you with the care you need, when you need it.  Your next appointment:   3 month(s)  The format for your next appointment:   In Person  Provider:   You may see Kirk Ruths, MD or one of the following Advanced Practice Providers on your designated Care Team:    Kerin Ransom, PA-C  [place], [place]  Coletta Memos, [place]

## 2019-05-27 LAB — BASIC METABOLIC PANEL
BUN/Creatinine Ratio: 25 (ref 12–28)
BUN: 84 mg/dL (ref 8–27)
CO2: 21 mmol/L (ref 20–29)
Calcium: 9 mg/dL (ref 8.7–10.3)
Chloride: 96 mmol/L (ref 96–106)
Creatinine, Ser: 3.35 mg/dL — ABNORMAL HIGH (ref 0.57–1.00)
GFR calc Af Amer: 14 mL/min/{1.73_m2} — ABNORMAL LOW (ref >59)
GFR calc non Af Amer: 13 mL/min/{1.73_m2} — ABNORMAL LOW (ref >59)
Glucose: 139 mg/dL — ABNORMAL HIGH (ref 65–99)
Potassium: 4.7 mmol/L (ref 3.5–5.2)
Sodium: 135 mmol/L (ref 134–144)

## 2019-05-27 LAB — CBC
Hematocrit: 23.1 % — ABNORMAL LOW (ref 34.0–46.6)
Hemoglobin: 7.2 g/dL — ABNORMAL LOW (ref 11.1–15.9)
MCH: 25.1 pg — ABNORMAL LOW (ref 26.6–33.0)
MCHC: 31.2 g/dL — ABNORMAL LOW (ref 31.5–35.7)
MCV: 81 fL (ref 79–97)
Platelets: 260 10*3/uL (ref 150–450)
RBC: 2.87 x10E6/uL — ABNORMAL LOW (ref 3.77–5.28)
RDW: 15.8 % — ABNORMAL HIGH (ref 11.7–15.4)
WBC: 5.6 10*3/uL (ref 3.4–10.8)

## 2019-05-27 LAB — BRAIN NATRIURETIC PEPTIDE: BNP: 372.9 pg/mL — ABNORMAL HIGH (ref 0.0–100.0)

## 2019-05-27 LAB — HEMOGLOBIN A1C
Est. average glucose Bld gHb Est-mCnc: 120 mg/dL
Hgb A1c MFr Bld: 5.8 % — ABNORMAL HIGH (ref 4.8–5.6)

## 2019-06-02 ENCOUNTER — Other Ambulatory Visit: Payer: Self-pay | Admitting: Neurosurgery

## 2019-06-02 DIAGNOSIS — D329 Benign neoplasm of meninges, unspecified: Secondary | ICD-10-CM

## 2019-08-19 NOTE — Progress Notes (Signed)
HPI: FU atrial flutter. Previously cared for by Dr. Doylene Canard. Admitted with atrial flutter in October 2016. Nuclear study showed ejection fraction 66% and no ischemia or infarction. Patient treated with metoprolol, Cardizem and amiodarone and converted. At previous office visit, apixaban DCed due to previous meningioma resection and frequent falls. I have had discussions with patient and family previously concerning atrial flutter ablation. However she is chronically ill and they preferred conservative measures including continuing amiodarone. Admitted February 2018 with progressive weakness and falls. Fractured ankle.Last echocardiogram November 2019 showed normal LV function, mild left ventricular hypertrophy, mild left atrial enlargement and mild to moderate pulmonary hypertension. Also with chronic stage IV kidney disease. Since last seen,she has some dyspnea unchanged.  No chest pain or syncope.  She has chronic bilateral lower extremity edema. Her renal function has worsened and she is being considered for dialysis.  Current Outpatient Medications  Medication Sig Dispense Refill  . acetaminophen (TYLENOL) 325 MG tablet Take 650 mg by mouth every 6 (six) hours as needed for mild pain.     Marland Kitchen albuterol (PROVENTIL) (2.5 MG/3ML) 0.083% nebulizer solution Take 3 mLs (2.5 mg total) by nebulization every 2 (two) hours as needed for shortness of breath. 75 mL 12  . amiodarone (PACERONE) 200 MG tablet Take 0.5 tablets (100 mg total) by mouth daily. TAKE 1/2 TABLET (200 MG TOTAL) BY MOUTH DAILY.    Marland Kitchen amLODipine (NORVASC) 10 MG tablet Take 1 tablet (10 mg total) by mouth daily.    . ARIPiprazole (ABILIFY) 5 MG tablet Take 5 mg by mouth every evening.    Marland Kitchen aspirin EC 81 MG tablet Take 1 tablet (81 mg total) by mouth daily. 90 tablet 3  . budesonide-formoterol (SYMBICORT) 80-4.5 MCG/ACT inhaler Inhale 2 puffs into the lungs 2 (two) times daily.    . Calcium Carb-Cholecalciferol (CALCIUM 600+D)  600-800 MG-UNIT TABS Take 1 tablet by mouth daily.    . citalopram (CELEXA) 20 MG tablet Take 20 mg by mouth daily.    . cloNIDine (CATAPRES - DOSED IN MG/24 HR) 0.3 mg/24hr patch Place 0.3 mg onto the skin every Friday.     . DULoxetine (CYMBALTA) 60 MG capsule Take 60 mg by mouth 2 (two) times daily.     Marland Kitchen HUMULIN R 500 UNIT/ML injection Inject 5-10 Units into the skin 3 (three) times daily with meals. Sliding scale:   NOTE:  Doses below are where U-100 syringe is drawn to; actually provides 5 times more insulin since using U-500 150 and below no Insulin  Breakfast  151-200 = 9 units 201-250= 10 units 251-300= 11 units 301-350=12 units 351-400= 13 units 4001-455= 14 units Above 450 = 15 units  Lunch  151-200= 8 units 201-250= 9 units 251-300= 10 units 301-350= 11 units 351-400= 12 units 401-450= 13 units Above 450 = 14 units  Supper 151-200= 4 units 201-250= 5 units 251-300= 6 units 301-350=7 units 351-400= 8 units 401-450= 9 units Above 450 = 10 units  NO INSULIN AFTER SUPPER, EVEN IF IT'S HIGH BECAUSE HER LEVELS WILL DROP DURING THE NIGHT. PLEASE GIVE A SNACK  1  . ipratropium-albuterol (DUONEB) 0.5-2.5 (3) MG/3ML SOLN Take 3 mLs by nebulization every 6 (six) hours as needed. 360 mL 1  . labetalol (NORMODYNE) 200 MG tablet Take 1 tablet (200 mg total) by mouth 2 (two) times daily.    Marland Kitchen lamoTRIgine (LAMICTAL) 100 MG tablet Take 100 mg by mouth 2 (two) times daily.    Marland Kitchen levothyroxine (  SYNTHROID, LEVOTHROID) 200 MCG tablet Take 1 tablet (200 mcg total) by mouth daily before breakfast. (Patient taking differently: Take 200 mcg by mouth daily. ) 90 tablet 3  . liraglutide (VICTOZA) 18 MG/3ML SOPN Inject 1.8 mg into the skin daily. Must take at with First dose if Inulin Must eat after    . loperamide (IMODIUM A-D) 2 MG tablet Take 4 mg by mouth 4 (four) times daily as needed for diarrhea or loose stools.    . lubiprostone (AMITIZA) 8 MCG capsule Take 8 mcg by mouth 2 (two)  times daily with a meal.    . meclizine (ANTIVERT) 25 MG tablet Take 25 mg by mouth 3 (three) times daily as needed for dizziness.    . Multiple Vitamin (MULTIVITAMIN WITH MINERALS) TABS tablet Take 1 tablet by mouth daily.    . nitroGLYCERIN (NITROSTAT) 0.4 MG SL tablet Place 0.4 mg under the tongue every 5 (five) minutes as needed for chest pain.    Marland Kitchen ondansetron (ZOFRAN) 8 MG tablet Take 8 mg by mouth in the morning, at noon, in the evening, and at bedtime.    . pantoprazole (PROTONIX) 40 MG tablet Take 40 mg by mouth 2 (two) times daily.     . Polyethylene Glycol 3350 (MIRALAX PO) Take by mouth.    . senna (SENOKOT) 8.6 MG TABS tablet Take 2 tablets by mouth at bedtime.     . TORSEMIDE PO Take 40 mg by mouth 2 (two) times daily.     . Vitamin D, Ergocalciferol, (DRISDOL) 1.25 MG (50000 UNIT) CAPS capsule Take 50,000 Units by mouth every 7 (seven) days.     No current facility-administered medications for this visit.     Past Medical History:  Diagnosis Date  . Abnormal liver function tests   . Acquired autoimmune hypothyroidism   . Basal cell carcinoma    Chest  . Cancer (HCC)    Breast  . Combined hyperlipidemia   . COPD with acute exacerbation ([place])   . Depression   . Diabetes mellitus   . Diabetes type 2, uncontrolled ([place])   . DM neuropathy with neurologic complication ([place])   . Fracture    Left wrist  . Goiter   . Gout   . History of gastroesophageal reflux (GERD)   . Hx of craniotomy 08/13/2017  . Hypertension   . Hypoglycemia associated with diabetes ([place])   . Meningioma ([place])   . Obesity   . Renal insufficiency   . Seizure ([place])   . Shoulder fracture, right 07/2015  . Sleep apnea, obstructive   . Thyroiditis, autoimmune   . Type II diabetes mellitus with peripheral angiopathy ([place])   . Vertigo     Past Surgical History:  Procedure Laterality Date  . APPENDECTOMY    . BACK SURGERY    . BASAL CELL CARCINOMA EXCISION     Chest  . CHOLECYSTECTOMY    .  CRANIOTOMY Right 05/08/2014   Procedure: CRANIOTOMY FOR MENINGIOMA;  Surgeon: Ashok Pall, MD;  Location: [place] NEURO ORS;  Service: Neurosurgery;  Laterality: Right;  Right Craniotomy for meningioma  . ESOPHAGOGASTRODUODENOSCOPY (EGD) WITH PROPOFOL N/A 05/22/2014   Procedure: ESOPHAGOGASTRODUODENOSCOPY (EGD) WITH PROPOFOL;  Surgeon: Wonda Horner, MD;  Location: [facility] ENDOSCOPY;  Service: Endoscopy;  Laterality: N/A;  . LAPAROSCOPIC GASTRIC BANDING    . MASTECTOMY Left     Social History   Socioeconomic History  . Marital status: Married    Spouse name: Konrad Dolores  . Number of children:  2  . Years of education: 7  . Highest education level: Not on file  Occupational History  . Occupation: Retired  Tobacco Use  . Smoking status: Never Smoker  . Smokeless tobacco: Never Used  Substance and Sexual Activity  . Alcohol use: No  . Drug use: No  . Sexual activity: Never  Other Topics Concern  . Not on file  Social History Narrative   02/13/17 living at home with spouse   Right-handed.   No caffeine use.   Social Determinants of Health   Financial Resource Strain:   . Difficulty of Paying Living Expenses:   Food Insecurity:   . Worried About Charity fundraiser in the Last Year:   . Arboriculturist in the Last Year:   Transportation Needs:   . Film/video editor (Medical):   Marland Kitchen Lack of Transportation (Non-Medical):   Physical Activity:   . Days of Exercise per Week:   . Minutes of Exercise per Session:   Stress:   . Feeling of Stress :   Social Connections:   . Frequency of Communication with Friends and Family:   . Frequency of Social Gatherings with Friends and Family:   . Attends Religious Services:   . Active Member of Clubs or Organizations:   . Attends Archivist Meetings:   Marland Kitchen Marital Status:   Intimate Partner Violence:   . Fear of Current or Ex-Partner:   . Emotionally Abused:   Marland Kitchen Physically Abused:   . Sexually Abused:     Family History  Problem  Relation Age of Onset  . Melanoma Mother   . Diabetes Father   . CAD Father   . Diabetes Sister   . Diabetes Brother     ROS: no fevers or chills, productive cough, hemoptysis, dysphasia, odynophagia, melena, hematochezia, dysuria, hematuria, rash, seizure activity, orthopnea, PND, claudication. Remaining systems are negative.  Physical Exam: Well-developed chronically ill appearing in no acute distress.  Skin is warm and dry.  HEENT is normal.  Neck is supple.  Chest is clear to auscultation with normal expansion.  Cardiovascular exam is regular rate and rhythm.  Abdominal exam nontender or distended. No masses palpated. Extremities show 2+ edema. neuro grossly intact  A/P  1 atrial flutter-continue amiodarone at present dose.  Check TSH, liver functions.  We will obtain most recent chest x-ray results from her nursing home.  She will continue on aspirin.  She has had frequent falls in the past and we have therefore elected not to anticoagulate as we feel risk outweighs benefit.  2 chronic diastolic congestive heart failure-she remains volume overloaded today.  However her renal function is deteriorating and there are discussions about whether she will want dialysis.  Nephrology is managing her Demadex.  3 hypertension-patient's blood pressure is controlled.  Continue present medications.  4 hyperlipidemia-managed by primary care.  5 chronic stage IV kidney disease-managed by nephrology.  There is a question of whether she wants to pursue dialysis.  They inquired whether she could have AV fistula placed for dialysis.  I think given her age and medical problems she will be at risk for any procedure but she is not having chest pain and this is a relatively low risk procedure.  She may proceed without further cardiac evaluation understanding there is some degree of risk.  Kirk Ruths, MD

## 2019-08-26 ENCOUNTER — Encounter (INDEPENDENT_AMBULATORY_CARE_PROVIDER_SITE_OTHER): Payer: Self-pay

## 2019-08-26 ENCOUNTER — Other Ambulatory Visit: Payer: Self-pay

## 2019-08-26 ENCOUNTER — Ambulatory Visit (INDEPENDENT_AMBULATORY_CARE_PROVIDER_SITE_OTHER): Payer: Medicare Other | Admitting: Cardiology

## 2019-08-26 ENCOUNTER — Encounter: Payer: Self-pay | Admitting: Cardiology

## 2019-08-26 VITALS — BP 124/52 | HR 57 | Ht 64.0 in | Wt 189.0 lb

## 2019-08-26 DIAGNOSIS — I483 Typical atrial flutter: Secondary | ICD-10-CM | POA: Diagnosis not present

## 2019-08-26 DIAGNOSIS — I10 Essential (primary) hypertension: Secondary | ICD-10-CM | POA: Diagnosis not present

## 2019-08-26 DIAGNOSIS — E782 Mixed hyperlipidemia: Secondary | ICD-10-CM | POA: Diagnosis not present

## 2019-08-26 DIAGNOSIS — Z79899 Other long term (current) drug therapy: Secondary | ICD-10-CM | POA: Diagnosis not present

## 2019-08-26 NOTE — Patient Instructions (Signed)
Medication Instructions:  NO CHANGE *If you need a refill on your cardiac medications before your next appointment, please call your pharmacy*   Lab Work: Your physician recommends that you HAVE LAB WORK WITH SCHEDULED NEXT DRAW If you have labs (blood work) drawn today and your tests are completely normal, you will receive your results only by: Marland Kitchen MyChart Message (if you have MyChart) OR . A paper copy in the mail If you have any lab test that is abnormal or we need to change your treatment, we will call you to review the results.   Follow-Up: At [facility], you and your health needs are our priority.  As part of our continuing mission to provide you with exceptional heart care, we have created designated Provider Care Teams.  These Care Teams include your primary Cardiologist (physician) and Advanced Practice Providers (APPs -  Physician Assistants and Nurse Practitioners) who all work together to provide you with the care you need, when you need it.  We recommend signing up for the patient portal called "MyChart".  Sign up information is provided on this After Visit Summary.  MyChart is used to connect with patients for Virtual Visits (Telemedicine).  Patients are able to view lab/test results, encounter notes, upcoming appointments, etc.  Non-urgent messages can be sent to your provider as well.   To learn more about what you can do with MyChart, go to [email or web address].    Your next appointment:   6 month(s)  The format for your next appointment:   Either In Person or Virtual  Provider:   You may see Kirk Ruths MD or one of the following Advanced Practice Providers on your designated Care Team:    Kerin Ransom, PA-C  [place], [place]  Coletta Memos, [place]

## 2020-02-24 NOTE — Progress Notes (Signed)
Virtual Visit via Video Note changed to phone visit at patient request   This visit type was conducted due to national recommendations for restrictions regarding the COVID-19 Pandemic (e.g. social distancing) in an effort to limit this patient's exposure and mitigate transmission in our community.  Due to her co-morbid illnesses, this patient is at least at moderate risk for complications without adequate follow up.  This format is felt to be most appropriate for this patient at this time.  All issues noted in this document were discussed and addressed.  A limited physical exam was performed with this format.  Please refer to the patient's chart for her consent to telehealth for [facility].      Date:  02/27/2020   ID:  Morgan [name], DOB Apr 29, 1940, MRN [id number]  Patient Location:Home Provider Location: Home  PCP:  Pcp, No  Cardiologist:  Dr Stanford Breed  Evaluation Performed:  Follow-Up Visit  Chief Complaint:  FU atrial flutter  History of Present Illness:    FU atrial flutter. Previously cared for by Dr. Doylene Canard. Admitted with atrial flutter in October 2016. Nuclear study showed ejection fraction 66% and no ischemia or infarction. Patient treated with metoprolol, Cardizem and amiodarone and converted. At previous office visit, apixaban DCed due to previous meningioma resection and frequent falls. I have had discussions with patient and family previously concerning atrial flutter ablation. However she is chronically ill and they preferred conservative measures including continuing amiodarone. Admitted February 2018 with progressive weakness and falls. Fractured ankle.Last echocardiogram November 2019 showed normal LV function, mild left ventricular hypertrophy, mild left atrial enlargement and mild to moderate pulmonary hypertension. Also with chronic stage IV kidney disease. Since last seen,she has some dyspnea.  No chest pain or syncope.  Her pedal edema has improved.  The  patient does not have symptoms concerning for COVID-19 infection (fever, chills, cough, or new shortness of breath).    Past Medical History:  Diagnosis Date  . Abnormal liver function tests   . Acquired autoimmune hypothyroidism   . Basal cell carcinoma    Chest  . Cancer (HCC)    Breast  . Combined hyperlipidemia   . COPD with acute exacerbation ([place])   . Depression   . Diabetes mellitus   . Diabetes type 2, uncontrolled ([place])   . 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 Fracture    Left wrist  . Goiter   . Gout   . History of gastroesophageal reflux (GERD)   . Hx of craniotomy 08/13/2017  . Hypertension   . Hypoglycemia associated with diabetes ([place])   . Meningioma ([place])   . Obesity   . Renal insufficiency   . Seizure ([place])   . Shoulder fracture, right 07/2015  . Sleep apnea, obstructive   . Thyroiditis, autoimmune   . Type II diabetes mellitus with peripheral angiopathy ([place])   . Vertigo    Past Surgical History:  Procedure Laterality Date  . APPENDECTOMY    . BACK SURGERY    . BASAL CELL CARCINOMA EXCISION     Chest  . CHOLECYSTECTOMY    . CRANIOTOMY Right 05/08/2014   Procedure: CRANIOTOMY FOR MENINGIOMA;  Surgeon: Ashok Pall, MD;  Location: [place] NEURO ORS;  Service: Neurosurgery;  Laterality: Right;  Right Craniotomy for meningioma  . ESOPHAGOGASTRODUODENOSCOPY (EGD) WITH PROPOFOL N/A 05/22/2014   Procedure: ESOPHAGOGASTRODUODENOSCOPY (EGD) WITH PROPOFOL;  Surgeon: Wonda Horner, MD;  Location: [facility] ENDOSCOPY;  Service: Endoscopy;  Laterality: N/A;  . LAPAROSCOPIC GASTRIC  BANDING    . MASTECTOMY Left      Current Meds  Medication Sig  . acetaminophen (TYLENOL) 325 MG tablet Take 650 mg by mouth every 6 (six) hours as needed for mild pain.   Marland Kitchen albuterol (PROVENTIL) (2.5 MG/3ML) 0.083% nebulizer solution Take 3 mLs (2.5 mg total) by nebulization every 2 (two) hours as needed for shortness of breath.  Marland Kitchen amiodarone (PACERONE) 200 MG tablet Take 0.5  tablets (100 mg total) by mouth daily. TAKE 1/2 TABLET (200 MG TOTAL) BY MOUTH DAILY.  Marland Kitchen ARIPiprazole (ABILIFY) 5 MG tablet Take 5 mg by mouth every evening.  Marland Kitchen aspirin EC 81 MG tablet Take 1 tablet (81 mg total) by mouth daily.  . budesonide-formoterol (SYMBICORT) 80-4.5 MCG/ACT inhaler Inhale 2 puffs into the lungs 2 (two) times daily.  . Calcium Carb-Cholecalciferol (CALCIUM 600+D) 600-800 MG-UNIT TABS Take 1 tablet by mouth daily.  . citalopram (CELEXA) 20 MG tablet Take 20 mg by mouth daily.  . cloNIDine (CATAPRES - DOSED IN MG/24 HR) 0.3 mg/24hr patch Place 0.3 mg onto the skin every Friday.   . DULoxetine (CYMBALTA) 60 MG capsule Take 60 mg by mouth 2 (two) times daily.   Marland Kitchen HUMULIN R 500 UNIT/ML injection Inject 5-10 Units into the skin 3 (three) times daily with meals. [name]:   NOTE:  Doses below are where U-100 syringe is drawn to; actually provides 5 times more insulin since using U-500 150 and below no Insulin  Breakfast  151-200 = 9 units 201-250= 10 units 251-300= 11 units 301-350=12 units 351-400= 13 units 4001-455= 14 units Above 450 = 15 units  Lunch  151-200= 8 units 201-250= 9 units 251-300= 10 units 301-350= 11 units 351-400= 12 units 401-450= 13 units Above 450 = 14 units  Supper 151-200= 4 units 201-250= 5 units 251-300= 6 units 301-350=7 units 351-400= 8 units 401-450= 9 units Above 450 = 10 units  NO INSULIN AFTER SUPPER, EVEN IF IT'S HIGH BECAUSE HER LEVELS WILL DROP DURING THE NIGHT. PLEASE GIVE A SNACK  . hydrALAZINE (APRESOLINE) 100 MG tablet Take 100 mg by mouth 3 (three) times daily.  Marland Kitchen ipratropium-albuterol (DUONEB) 0.5-2.5 (3) MG/3ML SOLN Take 3 mLs by nebulization every 6 (six) hours as needed.  . labetalol (NORMODYNE) 200 MG tablet Take 1 tablet (200 mg total) by mouth 2 (two) times daily.  Marland Kitchen lamoTRIgine (LAMICTAL) 100 MG tablet Take 100 mg by mouth 2 (two) times daily.  Marland Kitchen levothyroxine (SYNTHROID, LEVOTHROID) 200 MCG tablet Take 1  tablet (200 mcg total) by mouth daily before breakfast. (Patient taking differently: Take 175 mcg by mouth daily. )  . liraglutide (VICTOZA) 18 MG/3ML SOPN Inject 1.8 mg into the skin daily. Must take at with First dose if Inulin Must eat after  . loperamide (IMODIUM A-D) 2 MG tablet Take 4 mg by mouth 4 (four) times daily as needed for diarrhea or loose stools.  . lubiprostone (AMITIZA) 8 MCG capsule Take 8 mcg by mouth 2 (two) times daily with a meal.  . meclizine (ANTIVERT) 25 MG tablet Take 25 mg by mouth 3 (three) times daily as needed for dizziness.  . Multiple Vitamin (MULTIVITAMIN WITH MINERALS) TABS tablet Take 1 tablet by mouth daily.  . nitroGLYCERIN (NITROSTAT) 0.4 MG SL tablet Place 0.4 mg under the tongue every 5 (five) minutes as needed for chest pain.  Marland Kitchen ondansetron (ZOFRAN) 8 MG tablet Take 8 mg by mouth in the morning, at noon, in the evening, and at bedtime.  Marland Kitchen  pantoprazole (PROTONIX) 40 MG tablet Take 40 mg by mouth 2 (two) times daily.   . Polyethylene Glycol 3350 (MIRALAX PO) Take by mouth.  . senna (SENOKOT) 8.6 MG TABS tablet Take 2 tablets by mouth at bedtime.   . TORSEMIDE PO Take 20 mg by mouth 2 (two) times daily. 60 mg in the am and 40 mg at lunch  . Vitamin D, Ergocalciferol, (DRISDOL) 1.25 MG (50000 UNIT) CAPS capsule Take 50,000 Units by mouth every 7 (seven) days.     Allergies:   Latex, Wellbutrin [bupropion], Keppra [levetiracetam], Statins, Byetta 10 mcg pen [exenatide], Metformin, Penicillins, and Sulfa antibiotics   Social History   Tobacco Use  . Smoking status: Never Smoker  . Smokeless tobacco: Never Used  Substance Use Topics  . Alcohol use: No  . Drug use: No     Family Hx: The patient's family history includes CAD in her father; Diabetes in her brother, father, and sister; Melanoma in her mother.  ROS:   Please see the history of present illness.    No Fever, chills  or productive cough All other systems reviewed and are  negative.   Recent Labs: 05/26/2019: BNP 372.9; BUN 84; Creatinine, Ser 3.35; Hemoglobin 7.2; Platelets 260; Potassium 4.7; Sodium 135   Recent Lipid Panel Lab Results  Component Value Date/Time   CHOL  05/10/2009 04:37 AM    132        ATP III CLASSIFICATION:  <200     mg/dL   Desirable  200-239  mg/dL   Borderline High  >=240    mg/dL   High          TRIG 288 (H) 05/25/2014 04:30 AM    Wt Readings from Last 3 Encounters:  02/27/20 189 lb (85.7 kg)  08/26/19 189 lb (85.7 kg)  05/26/19 231 lb 14.4 oz (105.2 kg)     Objective:    Vital Signs:  BP 137/64   Pulse 62   Ht 5' 4"  (1.626 m)   Wt 189 lb (85.7 kg)   BMI 32.44 kg/m    VITAL SIGNS:  reviewed NAD Answers questions appropriately Normal affect Remainder of physical examination not performed (telehealth visit; coronavirus pandemic)  ASSESSMENT & PLAN:    1. Atrial flutter-plan to continue amiodarone at present dose.  Based on history she remains in sinus rhythm.  Check TSH and liver functions.  Given history of frequent falls she is not anticoagulated. 2. Chronic diastolic congestive heart failure-based on history her volume status has improved.  Continue Demadex.  Potassium and renal function monitored by nephrology. 3. Hypertension-patient's blood pressure is controlled based on history.  Continue present medical regimen and follow. 4. Hyperlipidemia-Per primary care. 5. Chronic stage IV kidney disease-followed by nephrology.  Apparently they have decided that she will likely not be a dialysis candidate in the future.  COVID-19 Education: The importance of social distancing was discussed today.  Time:   Today, I have spent 16 minutes with the patient with telehealth technology discussing the above problems.     Medication Adjustments/Labs and Tests Ordered: Current medicines are reviewed at length with the patient today.  Concerns regarding medicines are outlined above.   Tests Ordered: No orders of the  defined types were placed in this encounter.   Medication Changes: No orders of the defined types were placed in this encounter.   Follow Up:  Virtual Visit  in 6 month(s)  Signed, Kirk Ruths, MD  02/27/2020 9:54 AM  [facility]

## 2020-02-26 ENCOUNTER — Ambulatory Visit: Payer: Medicare Other | Admitting: Cardiology

## 2020-02-27 ENCOUNTER — Encounter: Payer: Self-pay | Admitting: Cardiology

## 2020-02-27 ENCOUNTER — Telehealth (INDEPENDENT_AMBULATORY_CARE_PROVIDER_SITE_OTHER): Payer: Medicare Other | Admitting: Cardiology

## 2020-02-27 VITALS — BP 137/64 | HR 62 | Ht 64.0 in | Wt 189.0 lb

## 2020-02-27 DIAGNOSIS — I10 Essential (primary) hypertension: Secondary | ICD-10-CM | POA: Diagnosis not present

## 2020-02-27 DIAGNOSIS — I5032 Chronic diastolic (congestive) heart failure: Secondary | ICD-10-CM

## 2020-02-27 DIAGNOSIS — I483 Typical atrial flutter: Secondary | ICD-10-CM

## 2020-02-27 NOTE — Patient Instructions (Signed)
Follow-Up: At [facility], you and your health needs are our priority.  As part of our continuing mission to provide you with exceptional heart care, we have created designated Provider Care Teams.  These Care Teams include your primary Cardiologist (physician) and Advanced Practice Providers (APPs -  Physician Assistants and Nurse Practitioners) who all work together to provide you with the care you need, when you need it.  We recommend signing up for the patient portal called "MyChart".  Sign up information is provided on this After Visit Summary.  MyChart is used to connect with patients for Virtual Visits (Telemedicine).  Patients are able to view lab/test results, encounter notes, upcoming appointments, etc.  Non-urgent messages can be sent to your provider as well.   To learn more about what you can do with MyChart, go to [email or web address].    Your next appointment:   6 month(s)  The format for your next appointment:   Virtual Visit   Provider:   Kirk Ruths, MD

## 2020-09-14 NOTE — Progress Notes (Signed)
Virtual Visit via Video Note changed to phone visit at patient request   This visit type was conducted due to national recommendations for restrictions regarding the COVID-19 Pandemic (e.g. social distancing) in an effort to limit this patient's exposure and mitigate transmission in our community.  Due to her co-morbid illnesses, this patient is at least at moderate risk for complications without adequate follow up.  This format is felt to be most appropriate for this patient at this time.  All issues noted in this document were discussed and addressed.  A limited physical exam was performed with this format.  Please refer to the patient's chart for her consent to telehealth for [facility].      Date:  09/28/2020   ID:  Morgan [name], DOB 1941-01-29, MRN [id number]  Patient Location:Home Provider Location: Home  PCP:  Pcp, No  Cardiologist:  Dr Stanford Breed  Evaluation Performed:  Follow-Up Visit  Chief Complaint:  FU atrial flutter  History of Present Illness:    FU atrial flutter. Admitted with atrial flutter in October 2016. Nuclear study showed ejection fraction 66% and no ischemia or infarction. Patient treated with metoprolol, Cardizem and amiodarone and converted. At previous office visit, apixaban DCed due to previous meningioma resection and frequent falls. I have had discussions with patient and family previously concerning atrial flutter ablation. However she is chronically ill and they preferred conservative measures including continuing amiodarone. Admitted February 2018 with progressive weakness and falls. Fractured ankle.  Last echocardiogram November 2019 showed normal LV function, mild left ventricular hypertrophy, mild left atrial enlargement and mild to moderate pulmonary hypertension.  Also with chronic stage IV kidney disease.  Since last seen, she has chronic dyspnea on exertion but has limited mobility.  She states that she is not having pedal edema.  No chest pain or  syncope.  The patient does not have symptoms concerning for COVID-19 infection (fever, chills, cough, or new shortness of breath).    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[place])    Fracture    Left wrist   Goiter    Gout    History of gastroesophageal reflux (GERD)    Hx of craniotomy 08/13/2017   Hypertension    Hypoglycemia associated with diabetes ([place])    Meningioma (HCC)    Obesity    Renal insufficiency    Seizure ([place])    Shoulder fracture, right 07/2015   Sleep apnea, obstructive    Thyroiditis, autoimmune    Type II diabetes mellitus with peripheral angiopathy ([place])    Vertigo    Past Surgical History:  Procedure Laterality Date   APPENDECTOMY     BACK SURGERY     BASAL CELL CARCINOMA EXCISION     Chest   CHOLECYSTECTOMY     CRANIOTOMY Right 05/08/2014   Procedure: CRANIOTOMY FOR MENINGIOMA;  Surgeon: Ashok Pall, MD;  Location: [facility] NEURO ORS;  Service: Neurosurgery;  Laterality: Right;  Right Craniotomy for meningioma   ESOPHAGOGASTRODUODENOSCOPY (EGD) WITH PROPOFOL N/A 05/22/2014   Procedure: ESOPHAGOGASTRODUODENOSCOPY (EGD) WITH PROPOFOL;  Surgeon: Wonda Horner, MD;  Location: [facility] ENDOSCOPY;  Service: Endoscopy;  Laterality: N/A;   LAPAROSCOPIC GASTRIC BANDING     MASTECTOMY Left      Current Meds  Medication Sig   acetaminophen (TYLENOL) 325 MG tablet Take 650 mg by mouth every 6 (six) hours as needed for mild pain.    albuterol (PROVENTIL) (2.5 MG/3ML) 0.083% nebulizer solution Take 3 mLs (2.5 mg total) by nebulization every 2 (two) hours as needed for shortness of breath.   amiodarone (PACERONE) 200 MG tablet Take 0.5 tablets (100 mg total) by  mouth daily. TAKE 1/2 TABLET (200 MG TOTAL) BY MOUTH DAILY.   ARIPiprazole (ABILIFY) 5 MG tablet Take 5 mg by mouth every evening.   aspirin EC 81 MG tablet Take 1 tablet (81 mg total) by mouth daily.   budesonide-formoterol (SYMBICORT) 80-4.5 MCG/ACT inhaler Inhale 2 puffs into the lungs 2 (two) times daily.   Calcium Carb-Cholecalciferol 600-800 MG-UNIT TABS Take 1 tablet by mouth daily.   citalopram (CELEXA) 20 MG tablet Take 20 mg by mouth daily.   cloNIDine (CATAPRES - DOSED IN MG/24 HR) 0.3 mg/24hr patch Place 0.3 mg onto the skin every Friday.    DULoxetine (CYMBALTA) 60 MG capsule Take 60 mg by mouth 2 (two) times daily.    HUMULIN R 500 UNIT/ML injection Inject 5-10 Units into the skin 3 (three) times daily with meals. [name]:   NOTE:  Doses below are where U-100 syringe is drawn to; actually provides 5 times more insulin since using U-500 150 and below no Insulin  Breakfast  151-200 = 9 units 201-250= 10 units 251-300= 11 units 301-350=12 units 351-400= 13 units 4001-455= 14 units Above 450 = 15 units  Lunch  151-200= 8 units 201-250= 9 units 251-300= 10 units 301-350= 11 units 351-400= 12 units 401-450= 13 units Above 450 = 14 units  Supper 151-200= 4 units 201-250= 5 units 251-300= 6 units 301-350=7 units 351-400= 8 units 401-450= 9 units Above 450 = 10 units  NO INSULIN AFTER SUPPER, EVEN IF IT'S HIGH BECAUSE HER LEVELS WILL DROP DURING THE NIGHT. PLEASE GIVE A SNACK   hydrALAZINE (APRESOLINE) 100 MG tablet Take 100 mg by mouth 3 (three) times daily.   ipratropium-albuterol (DUONEB) 0.5-2.5 (3) MG/3ML SOLN Take 3 mLs by nebulization every 6 (six) hours as needed.   labetalol (NORMODYNE) 200 MG tablet Take 1 tablet (200 mg total) by mouth 2 (two) times daily.   lamoTRIgine (LAMICTAL) 100 MG tablet Take 100 mg by mouth 2 (two) times daily.   levothyroxine (SYNTHROID, LEVOTHROID) 200 MCG tablet Take 1 tablet (200 mcg total) by mouth daily before breakfast.  (Patient taking differently: Take 175 mcg by mouth daily.)   liraglutide (VICTOZA) 18 MG/3ML SOPN Inject 1.8 mg into the skin daily. Must take at with First dose if Inulin Must eat after   loperamide (IMODIUM A-D) 2 MG tablet Take 4 mg by mouth 4 (four) times daily as needed for diarrhea or loose stools.   lubiprostone (AMITIZA) 8 MCG capsule Take 8 mcg by mouth 2 (two) times daily with a meal.   meclizine (ANTIVERT) 25 MG tablet Take 25 mg by mouth 3 (three) times daily as needed for dizziness.   Multiple Vitamin (MULTIVITAMIN WITH MINERALS) TABS tablet Take 1 tablet by mouth daily.   nitroGLYCERIN (NITROSTAT) 0.4 MG SL tablet Place 0.4 mg under the tongue every 5 (five) minutes as needed for chest pain.   ondansetron (ZOFRAN) 8 MG tablet Take 8 mg by mouth in the morning, at noon, in  the evening, and at bedtime.   pantoprazole (PROTONIX) 40 MG tablet Take 40 mg by mouth 2 (two) times daily.    Polyethylene Glycol 3350 (MIRALAX PO) Take by mouth.   senna (SENOKOT) 8.6 MG TABS tablet Take 2 tablets by mouth at bedtime.   TORSEMIDE PO Take 20 mg by mouth 2 (two) times daily. 60 mg in the am and 40 mg at lunch   Vitamin D, Ergocalciferol, (DRISDOL) 1.25 MG (50000 UNIT) CAPS capsule Take 50,000 Units by mouth every 7 (seven) days.     Allergies:   Latex, Wellbutrin [bupropion], Keppra [levetiracetam], Statins, Byetta 10 mcg pen [exenatide], Metformin, Penicillins, and Sulfa antibiotics   Social History   Tobacco Use   Smoking status: Never   Smokeless tobacco: Never  Substance Use Topics   Alcohol use: No   Drug use: No     Family Hx: The patient's family history includes CAD in her father; Diabetes in her brother, father, and sister; Melanoma in her mother.  ROS:   Please see the history of present illness.    No Fever, chills  or productive cough All other systems reviewed and are negative.   Recent Lipid Panel Lab Results  Component Value Date/Time   CHOL  05/10/2009 04:37 AM     132        ATP III CLASSIFICATION:  <200     mg/dL   Desirable  200-239  mg/dL   Borderline High  >=240    mg/dL   High          TRIG 288 (H) 05/25/2014 04:30 AM    Wt Readings from Last 3 Encounters:  09/28/20 203 lb 4.8 oz (92.2 kg)  02/27/20 189 lb (85.7 kg)  08/26/19 189 lb (85.7 kg)     Objective:    Vital Signs:  BP (!) 142/59   Pulse 61   Temp 98.1 F (36.7 C)   Ht 5' 4"  (1.626 m)   Wt 203 lb 4.8 oz (92.2 kg)   SpO2 99%   BMI 34.90 kg/m    VITAL SIGNS:  reviewed NAD Answers questions appropriately Normal affect Remainder of physical examination not performed (telehealth visit; coronavirus pandemic)  ASSESSMENT & PLAN:    Atrial flutter-based on history she has not had recurrences.  We will continue amiodarone.  I will have her most recent TSH, liver functions and chest x-ray forwarded to [place] from her nursing facility.  Given history of frequent fall she has not been anticoagulated. Chronic diastolic congestive heart failure-based on history her volume status is reasonable.  We will continue diuretic at present dose.  Potassium and renal function are monitored by nephrology. Hypertension-blood pressure controlled.  Continue present medications. Hyperlipidemia-managed by primary care. Chronic stage IV kidney disease-patient is followed by nephrology.  Apparently patient and family have decided not to pursue dialysis in the future.  COVID-19 Education: The importance of social distancing was discussed today.  Time:   Today, I have spent 14 minutes with the patient with telehealth technology discussing the above problems.     Medication Adjustments/Labs and Tests Ordered: Current medicines are reviewed at length with the patient today.  Concerns regarding medicines are outlined above.   Tests Ordered: No orders of the defined types were placed in this encounter.   Medication Changes: No orders of the defined types were placed in this  encounter.   Follow Up:  Virtual Visit  in 1 year(s)  Signed, Kirk Ruths, MD  09/28/2020 10:34 AM  [facility]

## 2020-09-28 ENCOUNTER — Telehealth (INDEPENDENT_AMBULATORY_CARE_PROVIDER_SITE_OTHER): Payer: Medicare Other | Admitting: Cardiology

## 2020-09-28 ENCOUNTER — Encounter: Payer: Self-pay | Admitting: Cardiology

## 2020-09-28 VITALS — BP 142/59 | HR 61 | Temp 98.1°F | Ht 64.0 in | Wt 203.3 lb

## 2020-09-28 DIAGNOSIS — I10 Essential (primary) hypertension: Secondary | ICD-10-CM | POA: Diagnosis not present

## 2020-09-28 DIAGNOSIS — I483 Typical atrial flutter: Secondary | ICD-10-CM | POA: Diagnosis not present

## 2020-09-28 DIAGNOSIS — I5032 Chronic diastolic (congestive) heart failure: Secondary | ICD-10-CM | POA: Diagnosis not present

## 2020-09-28 NOTE — Patient Instructions (Signed)

## 2022-11-09 DEATH — deceased
